# Patient Record
Sex: Female | Born: 1945
Health system: Southern US, Community
[De-identification: ages and names within clinical notes are randomized; demographics above are authoritative.]

## PROBLEM LIST (undated history)

## (undated) DIAGNOSIS — R51 Headache: Secondary | ICD-10-CM

## (undated) DIAGNOSIS — R350 Frequency of micturition: Secondary | ICD-10-CM

## (undated) DIAGNOSIS — I639 Cerebral infarction, unspecified: Secondary | ICD-10-CM

## (undated) DIAGNOSIS — K5900 Constipation, unspecified: Secondary | ICD-10-CM

## (undated) DIAGNOSIS — M549 Dorsalgia, unspecified: Secondary | ICD-10-CM

## (undated) DIAGNOSIS — G4730 Sleep apnea, unspecified: Secondary | ICD-10-CM

## (undated) DIAGNOSIS — T884XXA Failed or difficult intubation, initial encounter: Secondary | ICD-10-CM

## (undated) DIAGNOSIS — T8189XA Other complications of procedures, not elsewhere classified, initial encounter: Secondary | ICD-10-CM

## (undated) DIAGNOSIS — T4145XA Adverse effect of unspecified anesthetic, initial encounter: Secondary | ICD-10-CM

## (undated) DIAGNOSIS — E785 Hyperlipidemia, unspecified: Secondary | ICD-10-CM

## (undated) DIAGNOSIS — I499 Cardiac arrhythmia, unspecified: Secondary | ICD-10-CM

## (undated) DIAGNOSIS — I2510 Atherosclerotic heart disease of native coronary artery without angina pectoris: Secondary | ICD-10-CM

## (undated) DIAGNOSIS — R519 Headache, unspecified: Secondary | ICD-10-CM

## (undated) DIAGNOSIS — I10 Essential (primary) hypertension: Secondary | ICD-10-CM

## (undated) DIAGNOSIS — D649 Anemia, unspecified: Secondary | ICD-10-CM

## (undated) DIAGNOSIS — C801 Malignant (primary) neoplasm, unspecified: Secondary | ICD-10-CM

## (undated) DIAGNOSIS — G459 Transient cerebral ischemic attack, unspecified: Secondary | ICD-10-CM

## (undated) DIAGNOSIS — Z9289 Personal history of other medical treatment: Secondary | ICD-10-CM

## (undated) DIAGNOSIS — Z8619 Personal history of other infectious and parasitic diseases: Secondary | ICD-10-CM

## (undated) DIAGNOSIS — I4891 Unspecified atrial fibrillation: Secondary | ICD-10-CM

## (undated) DIAGNOSIS — I509 Heart failure, unspecified: Secondary | ICD-10-CM

## (undated) DIAGNOSIS — T7840XA Allergy, unspecified, initial encounter: Secondary | ICD-10-CM

## (undated) DIAGNOSIS — K219 Gastro-esophageal reflux disease without esophagitis: Secondary | ICD-10-CM

## (undated) DIAGNOSIS — G8929 Other chronic pain: Secondary | ICD-10-CM

## (undated) DIAGNOSIS — T8859XA Other complications of anesthesia, initial encounter: Secondary | ICD-10-CM

## (undated) HISTORY — DX: Heart failure, unspecified: I50.9

## (undated) HISTORY — PX: COLONOSCOPY: SHX174

## (undated) HISTORY — PX: ABDOMINAL HYSTERECTOMY: SHX81

## (undated) HISTORY — PX: HAMMER TOE SURGERY: SHX385

## (undated) HISTORY — DX: Allergy, unspecified, initial encounter: T78.40XA

## (undated) HISTORY — PX: ESOPHAGOGASTRODUODENOSCOPY: SHX1529

## (undated) HISTORY — DX: Gastro-esophageal reflux disease without esophagitis: K21.9

## (undated) HISTORY — PX: TONSILLECTOMY: SUR1361

---
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
## 1982-01-04 HISTORY — PX: BREAST SURGERY: SHX581

## 2006-03-17 ENCOUNTER — Ambulatory Visit: Payer: Self-pay | Admitting: Physician Assistant

## 2007-03-21 ENCOUNTER — Ambulatory Visit: Payer: Self-pay | Admitting: Gastroenterology

## 2007-03-21 LAB — HM COLONOSCOPY

## 2009-05-28 ENCOUNTER — Observation Stay: Payer: Self-pay | Admitting: Internal Medicine

## 2012-01-05 HISTORY — PX: CHOLECYSTECTOMY: SHX55

## 2012-04-25 ENCOUNTER — Ambulatory Visit: Payer: Self-pay | Admitting: Family Medicine

## 2012-04-27 ENCOUNTER — Ambulatory Visit (INDEPENDENT_AMBULATORY_CARE_PROVIDER_SITE_OTHER): Payer: Medicare Other | Admitting: General Surgery

## 2012-04-27 ENCOUNTER — Encounter: Payer: Self-pay | Admitting: General Surgery

## 2012-04-27 VITALS — BP 144/70 | HR 72 | Resp 14 | Ht 65.0 in | Wt 176.0 lb

## 2012-04-27 DIAGNOSIS — K8010 Calculus of gallbladder with chronic cholecystitis without obstruction: Secondary | ICD-10-CM

## 2012-04-27 LAB — CBC WITH DIFFERENTIAL/PLATELET
Basophil #: 0.1 10*3/uL (ref 0.0–0.1)
Basophil %: 0.4 %
Eosinophil #: 0.1 10*3/uL (ref 0.0–0.7)
Eosinophil %: 0.4 %
HCT: 53.4 % — ABNORMAL HIGH (ref 35.0–47.0)
HGB: 17.7 g/dL — ABNORMAL HIGH (ref 12.0–16.0)
Lymphocyte #: 2 10*3/uL (ref 1.0–3.6)
Lymphocyte %: 14.3 %
MCH: 30.3 pg (ref 26.0–34.0)
MCHC: 33.2 g/dL (ref 32.0–36.0)
MCV: 91 fL (ref 80–100)
Monocyte #: 0.8 x10 3/mm (ref 0.2–0.9)
Monocyte %: 5.6 %
Neutrophil #: 10.8 10*3/uL — ABNORMAL HIGH (ref 1.4–6.5)
Neutrophil %: 79.3 %
Platelet: 224 10*3/uL (ref 150–440)
RBC: 5.85 10*6/uL — ABNORMAL HIGH (ref 3.80–5.20)
RDW: 13.1 % (ref 11.5–14.5)
WBC: 13.7 10*3/uL — ABNORMAL HIGH (ref 3.6–11.0)

## 2012-04-27 LAB — COMPREHENSIVE METABOLIC PANEL
Albumin: 3.3 g/dL — ABNORMAL LOW (ref 3.4–5.0)
Alkaline Phosphatase: 97 U/L (ref 50–136)
Anion Gap: 7 (ref 7–16)
BUN: 21 mg/dL — ABNORMAL HIGH (ref 7–18)
Bilirubin,Total: 0.6 mg/dL (ref 0.2–1.0)
Calcium, Total: 8.7 mg/dL (ref 8.5–10.1)
Chloride: 110 mmol/L — ABNORMAL HIGH (ref 98–107)
Co2: 23 mmol/L (ref 21–32)
Creatinine: 0.97 mg/dL (ref 0.60–1.30)
EGFR (African American): >60
EGFR (Non-African Amer.): >60
Glucose: 113 mg/dL — ABNORMAL HIGH (ref 65–99)
Osmolality: 283 (ref 275–301)
Potassium: 4.2 mmol/L (ref 3.5–5.1)
SGOT(AST): 25 U/L (ref 15–37)
SGPT (ALT): 24 U/L (ref 12–78)
Sodium: 140 mmol/L (ref 136–145)
Total Protein: 7 g/dL (ref 6.4–8.2)

## 2012-04-27 LAB — LIPASE, BLOOD: Lipase: 185 U/L (ref 73–393)

## 2012-04-27 NOTE — Progress Notes (Signed)
Patient ID: Kayla Maxwell, female   DOB: 1945/03/28, 67 y.o.   MRN: [id number]  No chief complaint on file.   HPI Kayla Maxwell is a 67 y.o. female here today gallbladder . Patient had an ultrasound done 04/25/12.She has been having abdominal pain about an month. She stated having very bad pain started on  04/18/12 than stopped. On  04/22/12 she states that the pain came back and has been hurting ever since. HPI  Past Medical History  Diagnosis Date  . Allergy   . Acid reflux     Past Surgical History  Procedure Laterality Date  . Cesarean section  1968  . Breast surgery  1984  . Appendectomy  1963    No family history on file.  Social History History  Substance Use Topics  . Smoking status: Current Every Day Smoker -- 1.00 packs/day for 40 years  . Smokeless tobacco: Never Used  . Alcohol Use: No    Allergies  Allergen Reactions  . Codeine   . Penicillins   . Sulfa Antibiotics     Current Outpatient Prescriptions  Medication Sig Dispense Refill  . montelukast (SINGULAIR) 10 MG tablet Take 1 tablet by mouth daily.      Marland Kitchen NEXIUM 40 MG capsule Take 1 capsule by mouth daily.       No current facility-administered medications for this visit.    Review of Systems Review of Systems  Constitutional: Positive for fever, chills, fatigue and unexpected weight change. Negative for diaphoresis, activity change and appetite change.  Respiratory: Negative for apnea, cough, choking, chest tightness, wheezing and stridor.   Cardiovascular: Negative for chest pain, palpitations and leg swelling.  Gastrointestinal: Positive for nausea, vomiting, abdominal pain, constipation and blood in stool. Negative for diarrhea, abdominal distention, anal bleeding and rectal pain.    Blood pressure 144/70, pulse 72, resp. rate 14, height 5\' 5"  (1.651 m), weight 176 lb (79.833 kg).  Physical Exam Physical Exam  Constitutional: She is oriented to person, place, and time. She appears  well-developed and well-nourished.  Eyes: Conjunctivae are normal. No scleral icterus.  Neck: Normal range of motion. Neck supple.  Cardiovascular: Normal rate, regular rhythm and normal heart sounds.   Pulmonary/Chest: Effort normal and breath sounds normal.  Abdominal: Bowel sounds are normal. There is no hepatosplenomegaly. There is tenderness in the right upper quadrant. There is no rebound. No hernia.    Neurological: She is alert and oriented to person, place, and time.  Skin: Skin is warm and intact.    Data Reviewed [place] reviewed-gallstones and GBW thickening, no pericholcystic fluid. Labs- wbc 11 k, LFTs and Lipase normal.  Assessment    Suspect acute cholcystitis with cholelithiasis. Pt is very uncomfortable.     Plan    Admit to observationin [facility], lap chole tomorrow. Repeat labs.     Patient to be admitted to [facility] with plans for surgery on 04-28-12.   SANKAR,SEEPLAPUTHUR G 04/27/2012, 5:27 PM

## 2012-04-27 NOTE — Patient Instructions (Addendum)
Laparoscopic Cholecystectomy with Intraoperative Cholangiogram. The procedure, including it's potential risks and complications (including but not limited to infection, bleeding, injury to intra-abdominal organs or bile ducts, bile leak, poor cosmetic result, sepsis and death) were discussed with the patient in detail. Non-operative options, including their inherent risks (acute calculous cholecystitis with possible choledocholithiasis or gallstone pancreatitis, with the risk of ascending cholangitis, sepsis, and death) were discussed as well. The patient expressed and understanding of what we discussed and wishes to proceed with laparoscopic cholecystectomy. The patient further understands that if it is technically not possible, or it is unsafe to proceed laparoscopically, that I will convert to an open cholecystectomy.  Patient to be admitted to [facility] with plans for surgery on 04-28-12.

## 2012-04-28 ENCOUNTER — Inpatient Hospital Stay: Payer: Self-pay | Admitting: General Surgery

## 2012-04-28 DIAGNOSIS — K8000 Calculus of gallbladder with acute cholecystitis without obstruction: Secondary | ICD-10-CM

## 2012-04-29 LAB — CBC WITH DIFFERENTIAL/PLATELET
Basophil #: 0.1 10*3/uL (ref 0.0–0.1)
Basophil %: 0.3 %
Eosinophil #: 0 10*3/uL (ref 0.0–0.7)
Eosinophil %: 0.1 %
HCT: 42.3 % (ref 35.0–47.0)
HGB: 14.3 g/dL (ref 12.0–16.0)
Lymphocyte #: 1.6 10*3/uL (ref 1.0–3.6)
Lymphocyte %: 10.4 %
MCH: 30.4 pg (ref 26.0–34.0)
MCHC: 33.8 g/dL (ref 32.0–36.0)
MCV: 90 fL (ref 80–100)
Monocyte #: 1 x10 3/mm — ABNORMAL HIGH (ref 0.2–0.9)
Monocyte %: 6.4 %
Neutrophil #: 12.8 10*3/uL — ABNORMAL HIGH (ref 1.4–6.5)
Neutrophil %: 82.8 %
Platelet: 212 10*3/uL (ref 150–440)
RBC: 4.71 10*6/uL (ref 3.80–5.20)
RDW: 12.9 % (ref 11.5–14.5)
WBC: 15.5 10*3/uL — ABNORMAL HIGH (ref 3.6–11.0)

## 2012-04-29 LAB — COMPREHENSIVE METABOLIC PANEL
Albumin: 2.7 g/dL — ABNORMAL LOW (ref 3.4–5.0)
Alkaline Phosphatase: 91 U/L (ref 50–136)
Anion Gap: 6 — ABNORMAL LOW (ref 7–16)
BUN: 11 mg/dL (ref 7–18)
Bilirubin,Total: 0.4 mg/dL (ref 0.2–1.0)
Calcium, Total: 8.5 mg/dL (ref 8.5–10.1)
Chloride: 103 mmol/L (ref 98–107)
Co2: 26 mmol/L (ref 21–32)
Creatinine: 1.1 mg/dL (ref 0.60–1.30)
Glucose: 125 mg/dL — ABNORMAL HIGH (ref 65–99)
Osmolality: 271 (ref 275–301)
Potassium: 4.7 mmol/L (ref 3.5–5.1)
SGOT(AST): 44 U/L — ABNORMAL HIGH (ref 15–37)
SGPT (ALT): 64 U/L (ref 12–78)
Sodium: 135 mmol/L — ABNORMAL LOW (ref 136–145)
Total Protein: 5.8 g/dL — ABNORMAL LOW (ref 6.4–8.2)

## 2012-05-01 ENCOUNTER — Ambulatory Visit (INDEPENDENT_AMBULATORY_CARE_PROVIDER_SITE_OTHER): Payer: Medicare Other | Admitting: General Surgery

## 2012-05-01 ENCOUNTER — Encounter: Payer: Self-pay | Admitting: General Surgery

## 2012-05-01 DIAGNOSIS — K8010 Calculus of gallbladder with chronic cholecystitis without obstruction: Secondary | ICD-10-CM

## 2012-05-01 NOTE — Patient Instructions (Addendum)
Patient to change dressing as needed. Patient advised to return as scheduled.

## 2012-05-01 NOTE — Progress Notes (Signed)
Patient ID: Kayla Maxwell, female   DOB: 1945/10/24, 67 y.o.   MRN: [id number] Removed  JP drain and applied two 2x2 and paper tape placed over site with minimal discomfort to patient . Nurse advise her not to drive until pain free and feels comfortable. Redness notice around umbilical site.

## 2012-05-02 ENCOUNTER — Encounter: Payer: Self-pay | Admitting: General Surgery

## 2012-05-02 LAB — PATHOLOGY REPORT

## 2012-05-03 ENCOUNTER — Encounter: Payer: Self-pay | Admitting: General Surgery

## 2012-05-17 ENCOUNTER — Ambulatory Visit (INDEPENDENT_AMBULATORY_CARE_PROVIDER_SITE_OTHER): Payer: Medicare Other | Admitting: General Surgery

## 2012-05-17 ENCOUNTER — Encounter: Payer: Self-pay | Admitting: General Surgery

## 2012-05-17 VITALS — BP 144/78 | HR 76 | Resp 14 | Ht 65.0 in | Wt 176.0 lb

## 2012-05-17 DIAGNOSIS — K8010 Calculus of gallbladder with chronic cholecystitis without obstruction: Secondary | ICD-10-CM

## 2012-05-17 NOTE — Progress Notes (Signed)
Patient ID: Kayla Maxwell, female   DOB: November 07, 1945, 67 y.o.   MRN: [id number] This is an 67 year old female following up from her gallbladder surgery done on 04/28/12. Patient states no new problems. Eyes are clean and no icterus. Por sites are healing well. Abdomen is soft. Path reports was consistent with  Acute cholecystitis . Doing well postop

## 2012-05-17 NOTE — Patient Instructions (Addendum)
Resume activities as tolerated

## 2012-05-18 ENCOUNTER — Encounter: Payer: Self-pay | Admitting: General Surgery

## 2013-06-04 DIAGNOSIS — M25559 Pain in unspecified hip: Secondary | ICD-10-CM | POA: Insufficient documentation

## 2013-06-18 DIAGNOSIS — M5410 Radiculopathy, site unspecified: Secondary | ICD-10-CM | POA: Insufficient documentation

## 2013-06-18 DIAGNOSIS — M7060 Trochanteric bursitis, unspecified hip: Secondary | ICD-10-CM | POA: Insufficient documentation

## 2013-08-01 ENCOUNTER — Ambulatory Visit: Payer: Self-pay | Admitting: Orthopedic Surgery

## 2013-08-24 DIAGNOSIS — M5136 Other intervertebral disc degeneration, lumbar region: Secondary | ICD-10-CM | POA: Insufficient documentation

## 2013-09-21 DIAGNOSIS — M5116 Intervertebral disc disorders with radiculopathy, lumbar region: Secondary | ICD-10-CM | POA: Insufficient documentation

## 2013-11-05 ENCOUNTER — Encounter: Payer: Self-pay | Admitting: General Surgery

## 2013-11-09 ENCOUNTER — Observation Stay: Payer: Self-pay | Admitting: Internal Medicine

## 2013-11-09 LAB — BASIC METABOLIC PANEL
Anion Gap: 7 (ref 7–16)
BUN: 19 mg/dL — ABNORMAL HIGH (ref 7–18)
Calcium, Total: 8.3 mg/dL — ABNORMAL LOW (ref 8.5–10.1)
Chloride: 109 mmol/L — ABNORMAL HIGH (ref 98–107)
Co2: 26 mmol/L (ref 21–32)
Creatinine: 0.95 mg/dL (ref 0.60–1.30)
EGFR (African American): >60
EGFR (Non-African Amer.): >60
Glucose: 98 mg/dL (ref 65–99)
Osmolality: 285 (ref 275–301)
Potassium: 4.8 mmol/L (ref 3.5–5.1)
Sodium: 142 mmol/L (ref 136–145)

## 2013-11-09 LAB — CBC WITH DIFFERENTIAL/PLATELET
Basophil #: 0 10*3/uL (ref 0.0–0.1)
Basophil %: 0.3 %
Eosinophil #: 0.1 10*3/uL (ref 0.0–0.7)
Eosinophil %: 0.6 %
HCT: 50 % — ABNORMAL HIGH (ref 35.0–47.0)
HGB: 16.6 g/dL — ABNORMAL HIGH (ref 12.0–16.0)
Lymphocyte #: 1.6 10*3/uL (ref 1.0–3.6)
Lymphocyte %: 15.4 %
MCH: 31.7 pg (ref 26.0–34.0)
MCHC: 33.3 g/dL (ref 32.0–36.0)
MCV: 95 fL (ref 80–100)
Monocyte #: 0.5 x10 3/mm (ref 0.2–0.9)
Monocyte %: 5 %
Neutrophil #: 8.2 10*3/uL — ABNORMAL HIGH (ref 1.4–6.5)
Neutrophil %: 78.7 %
Platelet: 199 10*3/uL (ref 150–440)
RBC: 5.25 10*6/uL — ABNORMAL HIGH (ref 3.80–5.20)
RDW: 12.7 % (ref 11.5–14.5)
WBC: 10.5 10*3/uL (ref 3.6–11.0)

## 2013-11-09 LAB — TROPONIN I: Troponin-I: <0.02 ng/mL

## 2013-11-09 LAB — URINALYSIS, COMPLETE
Bacteria: NONE SEEN
Bilirubin,UR: NEGATIVE
Glucose,UR: NEGATIVE mg/dL (ref 0–75)
Ketone: NEGATIVE
Leukocyte Esterase: NEGATIVE
Nitrite: NEGATIVE
Ph: 5 (ref 4.5–8.0)
Protein: NEGATIVE
RBC,UR: 1 /HPF (ref 0–5)
Specific Gravity: 1.012 (ref 1.003–1.030)
Squamous Epithelial: <1
WBC UR: <1 /HPF (ref 0–5)

## 2013-11-10 LAB — TSH: Thyroid Stimulating Horm: 0.446 u[IU]/mL — ABNORMAL LOW

## 2013-11-10 LAB — LIPID PANEL
Cholesterol: 201 mg/dL — ABNORMAL HIGH (ref 0–200)
HDL Cholesterol: 45 mg/dL (ref 40–60)
Ldl Cholesterol, Calc: 117 mg/dL — ABNORMAL HIGH (ref 0–100)
Triglycerides: 195 mg/dL (ref 0–200)
VLDL Cholesterol, Calc: 39 mg/dL (ref 5–40)

## 2013-11-10 LAB — CBC WITH DIFFERENTIAL/PLATELET
Basophil #: 0 10*3/uL (ref 0.0–0.1)
Basophil %: 0.3 %
Eosinophil #: 0 10*3/uL (ref 0.0–0.7)
Eosinophil %: 0 %
HCT: 46.6 % (ref 35.0–47.0)
HGB: 15.8 g/dL (ref 12.0–16.0)
Lymphocyte #: 0.8 10*3/uL — ABNORMAL LOW (ref 1.0–3.6)
Lymphocyte %: 15.7 %
MCH: 31.6 pg (ref 26.0–34.0)
MCHC: 33.9 g/dL (ref 32.0–36.0)
MCV: 93 fL (ref 80–100)
Monocyte #: 0.1 x10 3/mm — ABNORMAL LOW (ref 0.2–0.9)
Monocyte %: 1.1 %
Neutrophil #: 4.5 10*3/uL (ref 1.4–6.5)
Neutrophil %: 82.9 %
Platelet: 192 10*3/uL (ref 150–440)
RBC: 5 10*6/uL (ref 3.80–5.20)
RDW: 12.9 % (ref 11.5–14.5)
WBC: 5.4 10*3/uL (ref 3.6–11.0)

## 2013-11-10 LAB — BASIC METABOLIC PANEL
Anion Gap: 5 — ABNORMAL LOW (ref 7–16)
BUN: 14 mg/dL (ref 7–18)
Calcium, Total: 7.9 mg/dL — ABNORMAL LOW (ref 8.5–10.1)
Chloride: 112 mmol/L — ABNORMAL HIGH (ref 98–107)
Co2: 24 mmol/L (ref 21–32)
Creatinine: 0.73 mg/dL (ref 0.60–1.30)
EGFR (African American): >60
EGFR (Non-African Amer.): >60
Glucose: 146 mg/dL — ABNORMAL HIGH (ref 65–99)
Osmolality: 284 (ref 275–301)
Potassium: 4.2 mmol/L (ref 3.5–5.1)
Sodium: 141 mmol/L (ref 136–145)

## 2014-02-22 ENCOUNTER — Other Ambulatory Visit: Payer: Self-pay | Admitting: Neurosurgery

## 2014-02-22 DIAGNOSIS — M5416 Radiculopathy, lumbar region: Secondary | ICD-10-CM

## 2014-03-01 ENCOUNTER — Ambulatory Visit
Admission: RE | Admit: 2014-03-01 | Discharge: 2014-03-01 | Disposition: A | Discharge status: Home / Self Care | Payer: Medicare Other | Source: Ambulatory Visit | Attending: Neurosurgery | Admitting: Neurosurgery

## 2014-03-01 ENCOUNTER — Other Ambulatory Visit: Payer: Self-pay | Admitting: Neurosurgery

## 2014-03-01 ENCOUNTER — Inpatient Hospital Stay
Admission: RE | Admit: 2014-03-01 | Discharge: 2014-03-01 | Disposition: A | Discharge status: Home / Self Care | Payer: Self-pay | Source: Ambulatory Visit | Attending: Neurosurgery | Admitting: Neurosurgery

## 2014-03-01 DIAGNOSIS — M5416 Radiculopathy, lumbar region: Secondary | ICD-10-CM

## 2014-03-01 DIAGNOSIS — R52 Pain, unspecified: Secondary | ICD-10-CM

## 2014-03-01 MED ORDER — IOHEXOL 180 MG/ML  SOLN
15.0000 mL | Freq: Once | INTRAMUSCULAR | Status: AC | PRN
  Administered 2014-03-01: 15 mL via INTRATHECAL

## 2014-03-01 MED ORDER — DIAZEPAM 5 MG PO TABS
5.0000 mg | ORAL_TABLET | Freq: Once | ORAL | Status: AC
  Administered 2014-03-01: 5 mg via ORAL

## 2014-03-01 NOTE — Discharge Instructions (Signed)
Myelogram Discharge Instructions ° °1. Go home and rest quietly for the next 24 hours.  It is important to lie flat for the next 24 hours.  Get up only to go to the restroom.  You may lie in the bed or on a couch on your back, your stomach, your left side or your right side.  You may have one pillow under your head.  You may have pillows between your knees while you are on your side or under your knees while you are on your back. ° °2. DO NOT drive today.  Recline the seat as far back as it will go, while [place] wearing your seat belt, on the way home. ° °3. You may get up to go to the bathroom as needed.  You may sit up for 10 minutes to eat.  You may resume your normal diet and medications unless otherwise indicated.  Drink plenty of extra fluids today and tomorrow. ° °4. The incidence of a spinal headache with nausea and/or vomiting is about 5% (one in 20 patients).  If you develop a headache, lie flat and drink plenty of fluids until the headache goes away.  Caffeinated beverages may be helpful.  If you develop severe nausea and vomiting or a headache that does not go away with flat bed rest, call [phone]. ° °5. You may resume normal activities after your 24 hours of bed rest is over; however, do not exert yourself strongly or do any heavy lifting tomorrow. ° °6. Call your physician for a follow-up appointment.  °

## 2014-04-12 ENCOUNTER — Other Ambulatory Visit: Payer: Self-pay | Admitting: Neurosurgery

## 2014-04-19 ENCOUNTER — Encounter (HOSPITAL_COMMUNITY): Payer: Self-pay

## 2014-04-19 ENCOUNTER — Encounter (HOSPITAL_COMMUNITY)
Admission: RE | Admit: 2014-04-19 | Discharge: 2014-04-19 | Disposition: A | Discharge status: Home / Self Care | Payer: Medicare Other | Source: Ambulatory Visit | Attending: Neurosurgery | Admitting: Neurosurgery

## 2014-04-19 DIAGNOSIS — Z01812 Encounter for preprocedural laboratory examination: Secondary | ICD-10-CM | POA: Diagnosis not present

## 2014-04-19 HISTORY — DX: Personal history of other medical treatment: Z92.89

## 2014-04-19 HISTORY — DX: Headache, unspecified: R51.9

## 2014-04-19 HISTORY — DX: Frequency of micturition: R35.0

## 2014-04-19 HISTORY — DX: Personal history of other infectious and parasitic diseases: Z86.19

## 2014-04-19 HISTORY — DX: Other chronic pain: G89.29

## 2014-04-19 HISTORY — DX: Hyperlipidemia, unspecified: E78.5

## 2014-04-19 HISTORY — DX: Headache: R51

## 2014-04-19 HISTORY — DX: Dorsalgia, unspecified: M54.9

## 2014-04-19 HISTORY — DX: Transient cerebral ischemic attack, unspecified: G45.9

## 2014-04-19 HISTORY — DX: Constipation, unspecified: K59.00

## 2014-04-19 LAB — BASIC METABOLIC PANEL
Anion gap: 8 (ref 5–15)
BUN: 24 mg/dL — ABNORMAL HIGH (ref 6–23)
CO2: 25 mmol/L (ref 19–32)
Calcium: 9 mg/dL (ref 8.4–10.5)
Chloride: 108 mmol/L (ref 96–112)
Creatinine, Ser: 1.08 mg/dL (ref 0.50–1.10)
GFR calc Af Amer: 59 mL/min — ABNORMAL LOW (ref >90)
GFR calc non Af Amer: 51 mL/min — ABNORMAL LOW (ref >90)
Glucose, Bld: 111 mg/dL — ABNORMAL HIGH (ref 70–99)
Potassium: 4.9 mmol/L (ref 3.5–5.1)
Sodium: 141 mmol/L (ref 135–145)

## 2014-04-19 LAB — CBC
HCT: 51.4 % — ABNORMAL HIGH (ref 36.0–46.0)
Hemoglobin: 17.2 g/dL — ABNORMAL HIGH (ref 12.0–15.0)
MCH: 30.8 pg (ref 26.0–34.0)
MCHC: 33.5 g/dL (ref 30.0–36.0)
MCV: 92.1 fL (ref 78.0–100.0)
Platelets: 229 10*3/uL (ref 150–400)
RBC: 5.58 MIL/uL — ABNORMAL HIGH (ref 3.87–5.11)
RDW: 12.8 % (ref 11.5–15.5)
WBC: 9.4 10*3/uL (ref 4.0–10.5)

## 2014-04-19 LAB — SURGICAL PCR SCREEN
MRSA, PCR: NEGATIVE
Staphylococcus aureus: NEGATIVE

## 2014-04-19 NOTE — Pre-Procedure Instructions (Signed)
Brynlie Daza Eagon  04/19/2014   Your procedure is scheduled on:  Tues, April 19 @ 12:05 PM  Report to [facility]  at 10:00 AM.  Call this number if you have problems the morning of surgery: [phone]   Remember:   Do not eat food or drink liquids after midnight.   Take these medicines the morning of surgery with A SIP OF WATER: Singulair(Montelukast) and Nexium(Esomeprazole)               Stop taking your Ibuprofen. No Goody's,BC's,Aleve,Aspirin,Fish Oil,or any Herbal Medications.    Do not wear jewelry, make-up or nail polish.  Do not wear lotions, powders, or perfumes. You may wear deodorant.  Do not shave 48 hours prior to surgery.   Do not bring valuables to the hospital.  [facility] is not responsible                  for any belongings or valuables.               Contacts, dentures or bridgework may not be worn into surgery.  Leave suitcase in the car. After surgery it may be brought to your room.  For patients admitted to the hospital, discharge time is determined by your                treatment team.               Patients discharged the day of surgery will not be allowed to drive  home.    Special Instructions:  [facility] - Preparing for Surgery  Before surgery, you can play an important role.  Because skin is not sterile, your skin needs to be as free of germs as possible.  You can reduce the number of germs on you skin by washing with CHG (chlorahexidine gluconate) soap before surgery.  CHG is an antiseptic cleaner which kills germs and bonds with the skin to continue killing germs even after washing.  Please DO NOT use if you have an allergy to CHG or antibacterial soaps.  If your skin becomes reddened/irritated stop using the CHG and inform your nurse when you arrive at Short Stay.  Do not shave (including legs and underarms) for at least 48 hours prior to the first CHG shower.  You may shave your face.  Please follow these instructions carefully:   1.   Shower with CHG Soap the night before surgery and the                                morning of Surgery.  2.  If you choose to wash your hair, wash your hair first as usual with your       normal shampoo.  3.  After you shampoo, rinse your hair and body thoroughly to remove the                      Shampoo.  4.  Use CHG as you would any other liquid soap.  You can apply chg directly       to the skin and wash gently with scrungie or a clean washcloth.  5.  Apply the CHG Soap to your body ONLY FROM THE NECK DOWN.        Do not use on open wounds or open sores.  Avoid contact with your eyes,  ears, mouth and genitals (private parts).  Wash genitals (private parts)       with your normal soap.  6.  Wash thoroughly, paying special attention to the area where your surgery        will be performed.  7.  Thoroughly rinse your body with warm water from the neck down.  8.  DO NOT shower/wash with your normal soap after using and rinsing off       the CHG Soap.  9.  Pat yourself dry with a clean towel.            10.  Wear clean pajamas.            11.  Place clean sheets on your bed the night of your first shower and do not        sleep with pets.  Day of Surgery  Do not apply any lotions/deoderants the morning of surgery.  Please wear clean clothes to the hospital/surgery center.     Please read over the following fact sheets that you were given: Pain Booklet, Coughing and Deep Breathing, MRSA Information and Surgical Site Infection Prevention

## 2014-04-19 NOTE — Progress Notes (Addendum)
Pt doesn't have a Cardiologist  Denies ever having anEcho  Stress test done several yrs ago   Denies ever having Heart cath  Denies CXR in past yr  EKG to be requested from [facility] is Dr.Nancy Venia Minks

## 2014-04-22 ENCOUNTER — Inpatient Hospital Stay (HOSPITAL_COMMUNITY): Payer: Medicare Other | Admitting: Vascular Surgery

## 2014-04-22 ENCOUNTER — Inpatient Hospital Stay (HOSPITAL_COMMUNITY): Payer: Medicare Other | Admitting: Anesthesiology

## 2014-04-22 NOTE — Progress Notes (Signed)
Anesthesia Chart Review:  Patient is a 69 year old female scheduled for bilateral L4-5 laminectomy on 04/25/14 by Dr. Hal Neer.  History includes smoker, HLD, GERD, chronic back pain, reported evidence of "TIA" [CVA] on prior MRI. PCP is Dr. Margarita Rana.  She denied seeing a cardiologist.  Preoperative labs noted. H/H 17.2/51.4.  She thought she had a prior stress test at [facility], but only an echo and EKG done there.   EKG Nov 18, 2013 [facility]) showed SR with short PR, LVH with repolarization abnormality (has T wave inversion in anterolateral leads, non-specific in inferior leads). Comparison EKG on 04/28/12 showed incomplete right BBB, LVH with repolarization abnormality (inferior and lateral T wave abnormality is more pronounced when compared to 11-18-2013).    05/29/09 Echo [facility]): Normal LV systolic function. EF => 55%. Mild concentric LVH. Mild MR/TR. Mildly dilated RA.  Reviewed her prior EKGs and 2011 echo with anesthesiologist Dr. Jenita Seashore.  Has had T wave abnormality felt to be related to repolarization abnormality for at least two years. She is a smoker, but she has no known CAD/MI/CHF or DM history. No CV symptoms documented at her PAT visit. If no acute changes then it is anticipated that she can proceed as planned.  George Hugh [facility] Short Stay Center/Anesthesiology Phone [phone] 04/22/2014 4:53 PM

## 2014-04-23 NOTE — Progress Notes (Signed)
Left message on pt's home number of time of arrival for Thursday, 04/25/14, instructed pt to be here at 10:00 AM.

## 2014-04-24 MED ORDER — VANCOMYCIN HCL IN DEXTROSE 1-5 GM/200ML-% IV SOLN
1000.0000 mg | INTRAVENOUS | Status: AC
  Filled 2014-04-24: qty 200

## 2014-04-24 MED ORDER — DEXAMETHASONE SODIUM PHOSPHATE 10 MG/ML IJ SOLN
10.0000 mg | INTRAMUSCULAR | Status: AC
  Filled 2014-04-24: qty 1

## 2014-04-25 ENCOUNTER — Ambulatory Visit (HOSPITAL_COMMUNITY)
Admission: RE | Admit: 2014-04-25 | Discharge: 2014-04-25 | DRG: 552 | Disposition: A | Discharge status: Home / Self Care | Payer: Medicare Other | Source: Ambulatory Visit | Attending: Neurosurgery | Admitting: Neurosurgery

## 2014-04-25 ENCOUNTER — Encounter (HOSPITAL_COMMUNITY): Payer: Self-pay | Admitting: *Deleted

## 2014-04-25 ENCOUNTER — Encounter (HOSPITAL_COMMUNITY)
Admission: RE | Disposition: A | Discharge status: Home / Self Care | Payer: Self-pay | Source: Ambulatory Visit | Attending: Neurosurgery

## 2014-04-25 DIAGNOSIS — M1990 Unspecified osteoarthritis, unspecified site: Secondary | ICD-10-CM | POA: Diagnosis present

## 2014-04-25 DIAGNOSIS — I4891 Unspecified atrial fibrillation: Secondary | ICD-10-CM | POA: Diagnosis not present

## 2014-04-25 DIAGNOSIS — M4806 Spinal stenosis, lumbar region: Secondary | ICD-10-CM | POA: Diagnosis present

## 2014-04-25 DIAGNOSIS — F17210 Nicotine dependence, cigarettes, uncomplicated: Secondary | ICD-10-CM | POA: Diagnosis not present

## 2014-04-25 DIAGNOSIS — I10 Essential (primary) hypertension: Secondary | ICD-10-CM | POA: Diagnosis present

## 2014-04-25 DIAGNOSIS — R0600 Dyspnea, unspecified: Secondary | ICD-10-CM | POA: Diagnosis present

## 2014-04-25 DIAGNOSIS — K219 Gastro-esophageal reflux disease without esophagitis: Secondary | ICD-10-CM | POA: Diagnosis not present

## 2014-04-25 DIAGNOSIS — Z538 Procedure and treatment not carried out for other reasons: Secondary | ICD-10-CM | POA: Diagnosis present

## 2014-04-25 SURGERY — CANCELLED PROCEDURE
Anesthesia: General | Site: Back | Laterality: Bilateral

## 2014-04-25 MED ORDER — NEOSTIGMINE METHYLSULFATE 10 MG/10ML IV SOLN
INTRAVENOUS | Status: AC
  Filled 2014-04-25: qty 3

## 2014-04-25 MED ORDER — FENTANYL CITRATE (PF) 250 MCG/5ML IJ SOLN
INTRAMUSCULAR | Status: AC
  Filled 2014-04-25: qty 5

## 2014-04-25 MED ORDER — MIDAZOLAM HCL 2 MG/2ML IJ SOLN
INTRAMUSCULAR | Status: AC
  Filled 2014-04-25: qty 2

## 2014-04-25 MED ORDER — PROPOFOL 10 MG/ML IV BOLUS
INTRAVENOUS | Status: AC
  Filled 2014-04-25: qty 20

## 2014-04-25 MED ORDER — LACTATED RINGERS IV SOLN
INTRAVENOUS | Status: DC
  Administered 2014-04-25: 10:00:00 via INTRAVENOUS

## 2014-04-25 SURGICAL SUPPLY — 41 items
BAG DECANTER FOR FLEXI CONT (MISCELLANEOUS) ×2 IMPLANT
BENZOIN TINCTURE PRP APPL 2/3 (GAUZE/BANDAGES/DRESSINGS) ×4 IMPLANT
BLADE CLIPPER SURG (BLADE) ×2 IMPLANT
BRUSH SCRUB EZ PLAIN DRY (MISCELLANEOUS) ×2 IMPLANT
BUR CUTTER 7.0 ROUND (BURR) ×2 IMPLANT
BUR MATCHSTICK NEURO 3.0 LAGG (BURR) IMPLANT
CANISTER SUCT 3000ML PPV (MISCELLANEOUS) ×2 IMPLANT
CONT SPEC 4OZ CLIKSEAL STRL BL (MISCELLANEOUS) ×2 IMPLANT
DRAPE C-ARM 42X72 X-RAY (DRAPES) ×4 IMPLANT
DRAPE LAPAROTOMY 100X72X124 (DRAPES) ×2 IMPLANT
DRAPE SURG 17X23 STRL (DRAPES) ×4 IMPLANT
DRSG OPSITE POSTOP 4X6 (GAUZE/BANDAGES/DRESSINGS) ×2 IMPLANT
DURAPREP 26ML APPLICATOR (WOUND CARE) ×2 IMPLANT
ELECT REM PT RETURN 9FT ADLT (ELECTROSURGICAL) ×2
ELECTRODE REM PT RTRN 9FT ADLT (ELECTROSURGICAL) ×1 IMPLANT
GAUZE SPONGE 4X4 12PLY STRL (GAUZE/BANDAGES/DRESSINGS) ×2 IMPLANT
GAUZE SPONGE 4X4 16PLY XRAY LF (GAUZE/BANDAGES/DRESSINGS) IMPLANT
GLOVE ECLIPSE 8.0 STRL XLNG CF (GLOVE) ×2 IMPLANT
GOWN STRL REUS W/ TWL LRG LVL3 (GOWN DISPOSABLE) IMPLANT
GOWN STRL REUS W/ TWL XL LVL3 (GOWN DISPOSABLE) ×1 IMPLANT
GOWN STRL REUS W/TWL 2XL LVL3 (GOWN DISPOSABLE) IMPLANT
GOWN STRL REUS W/TWL LRG LVL3 (GOWN DISPOSABLE)
GOWN STRL REUS W/TWL XL LVL3 (GOWN DISPOSABLE) ×1
KIT BASIN OR (CUSTOM PROCEDURE TRAY) ×2 IMPLANT
KIT ROOM TURNOVER OR (KITS) ×2 IMPLANT
LIQUID BAND (GAUZE/BANDAGES/DRESSINGS) ×2 IMPLANT
NEEDLE HYPO 22GX1.5 SAFETY (NEEDLE) ×2 IMPLANT
NEEDLE SPNL 22GX3.5 QUINCKE BK (NEEDLE) ×4 IMPLANT
NS IRRIG 1000ML POUR BTL (IV SOLUTION) ×2 IMPLANT
PACK LAMINECTOMY NEURO (CUSTOM PROCEDURE TRAY) ×2 IMPLANT
PAD ARMBOARD 7.5X6 YLW CONV (MISCELLANEOUS) ×6 IMPLANT
PATTIES SURGICAL .75X.75 (GAUZE/BANDAGES/DRESSINGS) ×2 IMPLANT
SPONGE SURGIFOAM ABS GEL SZ50 (HEMOSTASIS) ×2 IMPLANT
STRIP CLOSURE SKIN 1/2X4 (GAUZE/BANDAGES/DRESSINGS) ×2 IMPLANT
SUT PROLENE 0 CT 1 30 (SUTURE) IMPLANT
SUT VIC AB 2-0 OS6 18 (SUTURE) ×6 IMPLANT
SUT VIC AB 3-0 CP2 18 (SUTURE) ×2 IMPLANT
SYR 20ML ECCENTRIC (SYRINGE) ×2 IMPLANT
TOWEL OR 17X24 6PK STRL BLUE (TOWEL DISPOSABLE) ×2 IMPLANT
TOWEL OR 17X26 10 PK STRL BLUE (TOWEL DISPOSABLE) ×2 IMPLANT
WATER STERILE IRR 1000ML POUR (IV SOLUTION) ×2 IMPLANT

## 2014-04-25 NOTE — Progress Notes (Signed)
Anesthesia Assessment Pt with likely with untreated HTN and is also a smoker. She has had repolarization abnormality and LVH on prior EKG. Echo in 2011 showed LVH as well. Today she presented with an irregular heart rate and says she has DOE, but denies CP. EKG showed new onset afib. Pt states she has an irregular HR at times but has never had a-fib diagnosed. I believe she requires cardiology evaluation and Rx for her HTN. She needs a stress test at minimum. I spoke with the patient and Dr. Hal Neer about this and we will refer her for cardiology W/U and reschedule surgery after that time.   Lissa Hoard, MD

## 2014-04-25 NOTE — Anesthesia Preprocedure Evaluation (Deleted)
Anesthesia Evaluation  Patient identified by MRN, date of birth, ID band Patient awake    Reviewed: Allergy & Precautions, NPO status , Patient's Chart, lab work & pertinent test results  Airway Mallampati: III  TM Distance: <3 FB Neck ROM: Full    Dental no notable dental hx.    Pulmonary shortness of breath and with exertion, Current Smoker,  breath sounds clear to auscultation  Pulmonary exam normal       Cardiovascular hypertension, Rhythm:Irregular Rate:Normal - Systolic murmurs    Neuro/Psych  Headaches, TIAnegative psych ROS   GI/Hepatic Neg liver ROS, GERD-  ,  Endo/Other  negative endocrine ROS  Renal/GU negative Renal ROS     Musculoskeletal  (+) Arthritis -,   Abdominal   Peds  Hematology negative hematology ROS (+)   Anesthesia Other Findings   Reproductive/Obstetrics negative OB ROS                          Anesthesia Physical Anesthesia Plan  ASA: III  Anesthesia Plan: General   Post-op Pain Management:    Induction: Intravenous  Airway Management Planned: Oral ETT  Additional Equipment:   Intra-op Plan:   Post-operative Plan: Extubation in OR  Informed Consent: I have reviewed the patients History and Physical, chart, labs and discussed the procedure including the risks, benefits and alternatives for the proposed anesthesia with the patient or authorized representative who has indicated his/her understanding and acceptance.   Dental advisory given  Plan Discussed with: CRNA  Anesthesia Plan Comments: (Pt with likely with untreated HTN and is also a smoker. She has had repolarization abnormality and LVH on prior EKG. Echo in 2011 showed LVH as well. Today she presented with an irregular heart rate and says she has DOE, but denies CP. EKG showed new onset afib. Pt states she has an irregular HR at times but has never had a-fib diagnosed. I believe she requires  cardiology evaluation and Rx for her HTN. She needs a stress test at minimum. I spoke with the patient and Dr. Hal Neer about this and we will refer her for cardiology W/U and reschedule surgery after that time. )       Anesthesia Quick Evaluation

## 2014-04-26 NOTE — Discharge Summary (Signed)
PATIENT NAME:  Kayla Maxwell, Kayla Maxwell MR#:  [id number] DATE OF BIRTH:  07-07-1945  DATE OF ADMISSION:  04/27/2012 DATE OF DISCHARGE:  04/29/2012   HISTORY: This is a fairly healthy 69 year old female who presented with complaint of abdominal pain in the epigastric area and right upper quadrant for several days. The pain was persistent and was gradually getting worse. She had some minimal nausea associated, but no other symptoms such as fever or chills.   PAST MEDICAL HISTORY: Includes that of gastroesophageal reflux and some mild asthmatic symptoms for which she was on Singulair. She had unknown documented medical illnesses. The patient had ultrasound showing gallstones.   LABORATORY DATA:  White count 13,000, her lipase and her liver functions were normal.   [place]: On April 25, the patient was taken to the operating room and underwent laparoscopy and cholecystectomy with the finding of an acutely inflamed gallbladder with chronic cholecystitis pre-existing and multiple stones fully impacted in the midportion of the gallbladder down to the Hartmann's pouch. She had a normal intraoperative cholangiogram Because of significant oozing associated with the acute process and the raw surface of the liver bed, the patient had a drain placed.  Her postoperative course was basically uncomplicated. On April 26, the patient in order that her abdominal pain was fully resolved and she felt much better.  Her operative discomfort was minimal and well controlled with oral agent and she was therefore discharged home in stable condition on April 26 to be followed as an outpatient.   FINAL DIAGNOSIS:  Acute cholecystitis and cholelithiasis.   OPERATION PERFORMED: Laparoscopy and cholecystectomy with intraoperative cholangiogram   ____________________________ S.Robinette Haines, MD sgs:cc D: 04/30/2012 21:22:25 ET T: 04/30/2012 21:33:13 ET JOB#: [id number]  cc: Synthia Innocent. Jamal Collin, MD, <Dictator> [facility] Robinette Haines  MD ELECTRONICALLY SIGNED 05/10/2012 7:50

## 2014-04-26 NOTE — Op Note (Signed)
PATIENT NAME:  Kayla Maxwell, Kayla Maxwell MR#:  [id number] DATE OF BIRTH:  Feb 20, 1945  DATE OF PROCEDURE:  04/28/2012  PREOPERATIVE DIAGNOSIS: Acute and chronic cholecystitis with cholelithiasis.   POSTOPERATIVE DIAGNOSIS:  Acute and chronic cholecystitis with cholelithiasis.   OPERATION: Laparoscopy, cholecystectomy with intraoperative cholangiogram.   SURGEON: Mckinley Jewel, MD   ANESTHESIA: General.   COMPLICATIONS: None.   ESTIMATED BLOOD LOSS: Approximately 75 mL.   DRAINS: Blake drain.   DESCRIPTION OF PROCEDURE: The patient was put to sleep in the supine position on the operating table. The abdomen was prepped and draped out as a sterile field. Timeout procedure was performed. A small incision was made in the umbilicus area inferiorly and the incision was deepened through to the fascia which was then lifted up and the Veress needle with the InnerDyne sleeve was positioned in the peritoneal cavity. This was verified with the hanging drop method. Pneumoperitoneum was obtained and a 10 mm port was placed. The camera was placed through the peritoneal cavity with good visualization. Epigastric and 2 lateral 5 mm ports were then placed.   The gallbladder was draped over by the omentum and the adjoining portion of the transverse colon and mesocolon. This had to be peeled off very gradually with a moderate amount of oozing noted from inflamed surface of the gallbladder. The gallbladder was markedly distended and contained stones from the midportion down to the cystic duct area. Accordingly, a needle trocar was used to decompress the gallbladder to allow manipulation and mucoid bile-stained material was obtained. After about 10 mL of this was aspirated, the gallbladder was easier to manipulate. The Hartmann's pouch was pulled up and the cystic duct was identified. This was freed.  It was then proximally hemoclipped and a small opening was made. Through this a Reddick cholangiogram catheter was positioned and  intraoperative cholangiogram was performed. There was prompt filling of the bile duct both proximal and distal including the hepatic radicals. No filling defects were noted and there was no obstruction to flow into the duodenum. The catheter was removed. The cystic duct was hemoclipped and cut. Further dissection was performed and the cystic artery was identified. It was hemoclipped and cut. The gallbladder was dissected free from the bed. This was somewhat tedious because of the fact that it was acutely and chronically inflamed with adherence to the liver bed.  The raw surface of the liver bed had some oozing and, therefore, the procedure was slow.  After the gallbladder was freed, the gallbladder bed was inspected and bleeding points were cauterized. To minimize further oozing this area was covered with 5 mL of Surgiflo containing thrombin.  A small amount of fluid was used to irrigate out the right upper quadrant and suctioned out. Using a 5 mm scope through the epigastric port site, the gallbladder was placed in a retrieval bag and brought out through the umbilical port site. It required slight extension of both the skin and the fascial incision to remove this and contained multiple stones, completely packing the distal half of the gallbladder.   The drain was then positioned through this opening into the peritoneal cavity and with the umbilical port to adequately control and minimize air leak. The drain was brought out through the lateral port site incision and positioned underneath the liver. It was then fastened to the skin with a nylon stitch. Using suture passers, the fascial defect at the umbilicus was closed with 3 stitches of 0 Vicryl. Pneumoperitoneum was released. The ports were  removed. All the remaining skin incisions were closed with subcuticular 4-0 Vicryl. This was reinforced with Steri-Strips and dry sterile dressing was placed. The patient tolerated the procedure well. There were no immediate  problems encountered. She was subsequently extubated and returned to the recovery room in stable condition.    ____________________________ S.Robinette Haines, MD sgs:cs D: 04/30/2012 18:13:00 ET T: 04/30/2012 18:33:46 ET JOB#: [id number]  cc: S.G. Jamal Collin, MD, <Dictator> [facility] Robinette Haines MD ELECTRONICALLY SIGNED 04/30/2012 19:17

## 2014-04-27 NOTE — Discharge Summary (Signed)
PATIENT NAME:  Kayla Maxwell, SABAN MR#:  [id number] DATE OF BIRTH:  29-Apr-1945  PRESENTING COMPLAINT: Disorientation for about 15 minutes.   DISCHARGE DIAGNOSES: 1.   Transient global amnesia for about 15 minutes.  2.  Old cerebrovascular accident seen on MRI involving the anterior right internal capsule, no acute cerebrovascular accident.  3.  Sinusitis and bronchitis.  4.  Gastroesophageal reflux disease.  5.  Obstructive sleep apnea on CPAP.  6.  Ongoing tobacco abuse.  7.  History of coronary artery disease.   CONSULTATIONS: None.   PROCEDURES:   1.  CT of the head November 6 without contrast shows no acute intracranial findings.  2.  Chest x-ray November 6, shows no evidence of acute cardiopulmonary disease.  3.  MRI of the brain with and without contrast November 7 shows no acute intracranial abnormality. Remote lacunar infarct involving the anterior limb of the right internal capsule. Polyp or mucous retention cyst in the right maxillary sinus with evidence of chronic right maxillary sinus disease.   HISTORY OF PRESENT ILLNESS: This very pleasant 69 year old woman with past medical history of coronary artery disease and ongoing tobacco abuse, presents to the Emergency Room with concern that she had lost about 15 minutes of time. Per the family, she was talking and acting normally during that time.  She states that she was feeling fairly ill with symptoms of a sinus infection. She had been taking Sudafed and Mucinex. She was with her daughter and called her daughter's office to set up an appointment. She then became slightly confused and told her daughter that she did not know how to get to her doctor's office, a place that she has visited many times. The patient does not remember about 15 minutes of time during which she was acting and interacting normally. She did have a right-sided headache. She presents to the Emergency Room for further evaluation.   HOSPITAL COURSE BY PROBLEM:  Problem  #1. Transient global amnesia: At the time of presentation, the patient is cognitively intact and her neurologic exam was normal. CT of the head was negative. MRI showed evidence of an old CVA, but no acute infarct. It also supported diagnosis of sinusitis. It seems very possible that her current sinusitis, bronchitis, and use of over-the-counter cold medications including Sudafed could contribute to her symptoms. She spent one night in the hospital and had no recurrence of symptoms. At the time of discharge, she is at her baseline and feeling very comfortable.  Problem #2.  Old CVA seen on MRI involving the anterior right internal capsule. Lipids checked during this hospitalization showed an LDL of 117.  Given the history of CVA, this is too high.  She was started on a statin and daily aspirin. Blood pressures were well controlled throughout the hospitalization and no changes were made regarding her blood pressure. She was encouraged to stop smoking. She can follow up with her primary care provider for further evaluation and secondary prevention measures.  Problem #3.  Sinusitis and bronchitis: She was breathing normally throughout the hospitalization and did not need supplemental oxygen. She is discharged with azithromycin.  Problem #4.  Gastroesophageal reflux disease: Controlled. Continue PPI.  Problem #5.  Obstructive sleep apnea: Continue CPAP.  Problem #6.  Ongoing tobacco abuse: Smoking cessation counseling was provided during this hospitalization, especially in light of her old CVA it is important for her to stop smoking to decrease risk of recurrent stroke.   DISCHARGE EXAMINATION: VITAL SIGNS: Temperature 97.9, pulse 75,  respirations 18, blood pressure 145/76, oxygenation 94% on room air.  GENERAL: No acute distress.  CARDIOVASCULAR: Regular rate and rhythm, no murmurs, rubs, or gallops.  PULMONARY: Lungs are clear to auscultation bilaterally with good air movement.  NEUROLOGIC: Cranial nerves  II through XII are grossly intact. Strength and sensation are intact with good tone. Nonfocal neurologic examination.  PSYCHIATRIC: The patient is alert and oriented x 4 with good insight into her clinical condition.  LABORATORY DATA: Sodium 141, potassium 4.2, chloride 112, BUN 14, creatinine 0.73, glucose 146, total cholesterol 201, LDL 117, HDL 45, troponin less than 0.02. TSH 0.446. White blood cell count 5.4, hemoglobin 15.8, platelets 192,000, MCV is 93. UA shows no sign of infection.    DISCHARGE MEDICATIONS: 1.  Nexium 40 mg 1 capsule once a day.  2.  Docusate sodium 50 mg 2 capsules once a day.  3.  Ibuprofen 200 mg 3 tablets orally every 6 hours as needed.  4.  Guaifenesin 60 mg 1 tablet every 12 hours as needed.  5.  Azithromycin 250 mg oral tablet 1 tablet once a day for 4 days and then stop.  6.  Pravachol 40 mg 1 tablet once a day at bedtime.  7.  Aspirin 81 mg 1 tablet once a day.   CONDITION ON DISCHARGE: Alert and stable.   DISPOSITION: Discharged to home. No home health needs.   DIET: Heart healthy diet, low fat, low cholesterol.   ACTIVITY: No restrictions.   TIMEFRAME FOR FOLLOWUP: Follow up with Dr. Venia Minks within 1-2 weeks of discharge.   TIME SPENT ON DISCHARGE: 40 minutes.    ____________________________ Earleen Newport. Volanda Napoleon, MD cpw:LT D: 11/11/2013 20:13:41 ET T: 11/11/2013 20:27:46 ET JOB#: [id number]  cc: Earleen Newport. Volanda Napoleon, MD, <Dictator> Jerrell Belfast, MD Aldean Jewett MD ELECTRONICALLY SIGNED 11/16/2013 11:45

## 2014-04-27 NOTE — H&P (Signed)
PATIENT NAME:  Kayla Maxwell, Kayla Maxwell MR#:  [id number] DATE OF BIRTH:  14-Jul-1945  DATE OF ADMISSION:  11/09/2013  PRIMARY CARE PHYSICIAN: Jerrell Belfast, MD.   CHIEF COMPLAINT: Transient memory loss.   HISTORY OF PRESENT ILLNESS: This is a 69 year old female who states that she lost about 15 minutes of time. As per the family, she was talking and acting normal and moving around normal. She states that she has been having a sinus cold and coughing and she was taking some Mucinex and some Sudafed. She called her doctor's office and set up an appointment for 11:45. She told her daughter that she does not know how to get to the doctor. She was moving around fine and motor skills were fine, but she does not remember about 15 minutes of time frame. She did have a right headache. She got over to Dr. Sharyon Medicus office and Dr. Venia Minks sent her to the ER for further evaluation. The patient feels fine at this point in time, remembers bits and parts of being in Dr. Sharyon Medicus office, but the 15 to 20 minutes prior does not recall anything from that time frame. Hospitalist services were contacted for further evaluation.   PAST MEDICAL HISTORY: Pain on the left leg. Mild heart attack in the past. Gastroesophageal reflux disease and sleep apnea.   PAST SURGICAL HISTORY: Cholecystectomy, appendectomy, hysterectomy, tonsillectomy, C-section.   ALLERGIES: PENICILLIN, SULFA, CODEINE AND CODAFED.   MEDICATIONS: What the patient takes on a daily basis include Colace 50 mg 2 tablets daily, guaifenesin 600 mg twice a day, ibuprofen 200 mg 3 tablets every 6 hours as needed, Nexium 40 mg daily.   SOCIAL HISTORY: Smokes 1 pack per day for the last 46.5 years. No alcohol. No drug use. 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   FAMILY HISTORY: Father died at 60 of an MI. Mother died at 79 of Alzheimers. Sister died of pancreatic cancer.   REVIEW OF SYSTEMS:  CONSTITUTIONAL: Positive for a 40 pound weight gain in  8 years. No fever, chills or sweats.  EYES: She does have runny eyes and a runny nose. She does wear glasses.  EARS, NOSE, MOUTH AND THROAT: Positive for runny nose. No sore throat. No difficulty swallowing.  CARDIOVASCULAR: Positive for chest pain with cough only. No palpitations.  RESPIRATORY: Positive for shortness of breath. Positive for cough. No phlegm. One episode where she may have coughed up a little blood today.  GASTROINTESTINAL: Positive for vomiting with coughing fits. Lower abdominal pain on the left side. No diarrhea. No constipation.  GENITOURINARY: No burning on urination or hematuria.  MUSCULOSKELETAL: Positive for left leg pain.  INTEGUMENT: No rashes or eruptions.  NEUROLOGIC: No fainting or blackouts.  PSYCHIATRIC: No anxiety or depression.  ENDOCRINE: No thyroid problems.  HEMATOLOGIC AND LYMPHATIC: No anemia, no easy bruising or bleeding.   PHYSICAL EXAMINATION: VITAL SIGNS: Temperature 97.6, pulse 77, respirations 20, blood pressure 177/85, pulse oximetry 97% on room air.  GENERAL: No respiratory distress.  EYES: Conjunctivae and lids normal. Pupils equal, round, and reactive to light. Extraocular muscles intact. No nystagmus.  EARS, NOSE, MOUTH AND THROAT: Tympanic membranes: No erythema. Nasal mucosa: No erythema. Throat: Slight erythema. No exudate seen. Lips and gums: No lesions.  NECK: No JVD. No bruits. No lymphadenopathy. No thyromegaly. No thyroid nodules palpated.  RESPIRATORY: Decreased breath sounds bilaterally. Positive wheeze throughout bilateral lung fields. No rhonchi or rales heard.  CARDIOVASCULAR: S1, S2 normal. No gallops, rubs or murmurs heard. Carotid  upstroke 2+ bilaterally. No bruits. Dorsalis pedis pulses 2+ bilaterally. Trace edema of the lower extremities.  ABDOMEN: Soft, nontender. No organomegaly/splenomegaly. Normoactive bowel sounds. No masses felt.  LYMPHATIC: No lymph nodes in the neck.  MUSCULOSKELETAL: No clubbing, edema or cyanosis.   SKIN: No rashes or lesions seen.  NEUROLOGIC: Cranial nerves 2 through 12 grossly intact. Deep tendon reflexes 2+ bilateral lower extremities.  PSYCHIATRIC: The patient is oriented to person, place and time.   RADIOLOGICAL DATA: Chest x-ray negative. CT scan of the head negative. White blood cell count 10.5, hemoglobin and hematocrit 16.6 and 50.0, platelet count 199,000. Glucose 98, BUN 19, creatinine 0.95, sodium 142, potassium 4.9, chloride 109, CO2 of 26, calcium 8.3. Troponin negative. Urinalysis 1+ blood.   RADIOLOGIC DATA: EKG: Sinus rhythm, short PR interval, LVH.   ASSESSMENT AND PLAN: 1. Transient global amnesia versus a transient ischemic attack. Will admit as an observation, get an MRI of the brain. If MRI is negative, can probably be sent home. If MRI shows a stroke, may end up needing carotid ultrasound and echocardiogram but will hold off on that testing at this time.  2. Mild chronic obstructive pulmonary disease exacerbation. Will give a couple doses of Solu-Medrol, DuoNeb nebulizer solution and Zithromax.  3. Gastroesophageal reflux disease on Nexium as outpatient. We will give Protonix here.  4. Sleep apnea on CPAP.  5. Tobacco abuse. Smoking cessation counseling done 3 minutes by me. Nicotine patch applied.  6. Polycythemia. This could be secondary to smoking versus dehydration. Will give a liter of IV fluid and recheck a CBC in the a.m.   TIME SPENT: On admission 55 minutes.   CODE STATUS: The patient wishes to be a do not resuscitate.     ____________________________ Tana Conch. Leslye Peer, MD rjw:TT D: 11/09/2013 19:10:27 ET T: 11/09/2013 19:54:41 ET JOB#: [id number]  cc: Tana Conch. Leslye Peer, MD, <Dictator> Jerrell Belfast, MD Marisue Brooklyn MD ELECTRONICALLY SIGNED 11/15/2013 1:36

## 2014-04-29 DIAGNOSIS — F17210 Nicotine dependence, cigarettes, uncomplicated: Secondary | ICD-10-CM | POA: Insufficient documentation

## 2014-04-29 DIAGNOSIS — K219 Gastro-esophageal reflux disease without esophagitis: Secondary | ICD-10-CM | POA: Insufficient documentation

## 2014-04-29 DIAGNOSIS — Z87891 Personal history of nicotine dependence: Secondary | ICD-10-CM | POA: Insufficient documentation

## 2014-04-29 DIAGNOSIS — G4733 Obstructive sleep apnea (adult) (pediatric): Secondary | ICD-10-CM | POA: Insufficient documentation

## 2014-04-29 DIAGNOSIS — F17219 Nicotine dependence, cigarettes, with unspecified nicotine-induced disorders: Secondary | ICD-10-CM | POA: Insufficient documentation

## 2014-04-29 DIAGNOSIS — I4891 Unspecified atrial fibrillation: Secondary | ICD-10-CM | POA: Insufficient documentation

## 2014-05-08 DIAGNOSIS — I4891 Unspecified atrial fibrillation: Secondary | ICD-10-CM | POA: Insufficient documentation

## 2014-05-08 DIAGNOSIS — I340 Nonrheumatic mitral (valve) insufficiency: Secondary | ICD-10-CM | POA: Insufficient documentation

## 2014-05-08 DIAGNOSIS — I482 Chronic atrial fibrillation, unspecified: Secondary | ICD-10-CM | POA: Insufficient documentation

## 2014-05-08 DIAGNOSIS — I071 Rheumatic tricuspid insufficiency: Secondary | ICD-10-CM | POA: Insufficient documentation

## 2014-05-10 ENCOUNTER — Other Ambulatory Visit: Payer: Self-pay | Admitting: Neurosurgery

## 2014-06-21 ENCOUNTER — Other Ambulatory Visit: Payer: Self-pay | Admitting: Family Medicine

## 2014-06-21 DIAGNOSIS — E785 Hyperlipidemia, unspecified: Secondary | ICD-10-CM

## 2014-06-24 NOTE — Pre-Procedure Instructions (Addendum)
Jailen Lung Klabunde  06/24/2014      [facility], [place] - [address] [place] [address] Phone: [phone] Fax: [phone]    Your procedure is scheduled on : June 30th, Thursday.   Report to [facility] Admitting at 5:30 A.M.  Call this number if you have problems the morning of surgery:  [phone]   Remember:  Do not eat food or drink liquids after midnight Wednesday..  Take these medicines the morning of surgery with A SIP OF WATER: Metoprolol, Nexium.   Do not wear jewelry, make-up or nail polish.  Do not wear lotions, powders, or perfumes.  You may NOT wear deodorant the day of surgery..  Do not shave underarms & legs 48 hours prior to surgery.       Do not bring valuables to the hospital.  [facility] is not responsible for any belongings or valuables.  Contacts, dentures or bridgework may not be worn into surgery.  Leave your suitcase in the car.  After surgery it may be brought to your room. For patients admitted to the hospital, discharge time will be determined by your treatment team.  Name and phone number of your driver:     Special instructions:  "Preparing for Surgery" instruction sheet.  Please read over the following fact sheets that you were given. Pain Booklet, Coughing and Deep Breathing, MRSA Information and Surgical Site Infection Prevention

## 2014-06-25 ENCOUNTER — Encounter (HOSPITAL_COMMUNITY): Payer: Self-pay

## 2014-06-25 ENCOUNTER — Encounter (HOSPITAL_COMMUNITY)
Admission: RE | Admit: 2014-06-25 | Discharge: 2014-06-25 | Disposition: A | Discharge status: Home / Self Care | Payer: Medicare Other | Source: Ambulatory Visit | Attending: Neurosurgery | Admitting: Neurosurgery

## 2014-06-25 DIAGNOSIS — F17200 Nicotine dependence, unspecified, uncomplicated: Secondary | ICD-10-CM | POA: Insufficient documentation

## 2014-06-25 DIAGNOSIS — G4733 Obstructive sleep apnea (adult) (pediatric): Secondary | ICD-10-CM | POA: Insufficient documentation

## 2014-06-25 DIAGNOSIS — E785 Hyperlipidemia, unspecified: Secondary | ICD-10-CM | POA: Diagnosis not present

## 2014-06-25 DIAGNOSIS — I4891 Unspecified atrial fibrillation: Secondary | ICD-10-CM | POA: Diagnosis not present

## 2014-06-25 DIAGNOSIS — Z01818 Encounter for other preprocedural examination: Secondary | ICD-10-CM | POA: Diagnosis present

## 2014-06-25 DIAGNOSIS — Z8673 Personal history of transient ischemic attack (TIA), and cerebral infarction without residual deficits: Secondary | ICD-10-CM | POA: Diagnosis not present

## 2014-06-25 DIAGNOSIS — Z01812 Encounter for preprocedural laboratory examination: Secondary | ICD-10-CM | POA: Insufficient documentation

## 2014-06-25 DIAGNOSIS — Z8542 Personal history of malignant neoplasm of other parts of uterus: Secondary | ICD-10-CM | POA: Diagnosis not present

## 2014-06-25 HISTORY — DX: Sleep apnea, unspecified: G47.30

## 2014-06-25 HISTORY — DX: Cardiac arrhythmia, unspecified: I49.9

## 2014-06-25 HISTORY — DX: Cerebral infarction, unspecified: I63.9

## 2014-06-25 HISTORY — DX: Malignant (primary) neoplasm, unspecified: C80.1

## 2014-06-25 LAB — COMPREHENSIVE METABOLIC PANEL
ALT: 20 U/L (ref 14–54)
AST: 18 U/L (ref 15–41)
Albumin: 3.7 g/dL (ref 3.5–5.0)
Alkaline Phosphatase: 72 U/L (ref 38–126)
Anion gap: 7 (ref 5–15)
BUN: 17 mg/dL (ref 6–20)
CO2: 29 mmol/L (ref 22–32)
Calcium: 9 mg/dL (ref 8.9–10.3)
Chloride: 105 mmol/L (ref 101–111)
Creatinine, Ser: 0.96 mg/dL (ref 0.44–1.00)
GFR calc Af Amer: >60 mL/min (ref >60)
GFR calc non Af Amer: 59 mL/min — ABNORMAL LOW (ref >60)
Glucose, Bld: 116 mg/dL — ABNORMAL HIGH (ref 65–99)
Potassium: 4.2 mmol/L (ref 3.5–5.1)
Sodium: 141 mmol/L (ref 135–145)
Total Bilirubin: 0.5 mg/dL (ref 0.3–1.2)
Total Protein: 6.2 g/dL — ABNORMAL LOW (ref 6.5–8.1)

## 2014-06-25 LAB — CBC
HCT: 50.4 % — ABNORMAL HIGH (ref 36.0–46.0)
Hemoglobin: 17.1 g/dL — ABNORMAL HIGH (ref 12.0–15.0)
MCH: 31.4 pg (ref 26.0–34.0)
MCHC: 33.9 g/dL (ref 30.0–36.0)
MCV: 92.6 fL (ref 78.0–100.0)
Platelets: 238 10*3/uL (ref 150–400)
RBC: 5.44 MIL/uL — ABNORMAL HIGH (ref 3.87–5.11)
RDW: 13 % (ref 11.5–15.5)
WBC: 8.8 10*3/uL (ref 4.0–10.5)

## 2014-06-25 LAB — SURGICAL PCR SCREEN
MRSA, PCR: NEGATIVE
Staphylococcus aureus: NEGATIVE

## 2014-06-25 NOTE — Progress Notes (Signed)
Anesthesia Chart Review:  Pt is 69 year old female scheduled for L4-5 laminectomy and foraminotomy on 07/04/2014 with Dr. Hal Neer.   Cardiologist is Dr. Serafina Royals at [place], see care everywhere.   PMH includes: atrial fibrillation, OSA (on CPAP), hyperlipidemia, TIA (x2), uterine cancer. Current smoker. BMI 31.  Medications include: Metoprolol, pravastatin, xarelto. Pt to stop xarelto 3 days prior to surgery.   Preoperative labs noted.   EKG 04/25/2014: Atrial fibrillation.ST & T wave abnormality, consider inferolateral ischemia. Prolonged Q  Echo 05/08/2014: -Normal LV systolic function with mild LVH -Normal RV systolic function -Moderate mitral and tricuspid regurgitation -no valvular stenosis  Pt was previously scheduled for this surgery in April 2016 but it was cancelled when she was found to have new afib on DOS. Pt has since then seen cardiology. Dr. Nehemiah Massed has cleared pt for surgery (see office visit in care everywhere 06/24/2014).   If no changes, I anticipate pt can proceed with surgery as scheduled.   Willeen Cass, FNP-BC [facility]/Anesthesiology Phone: [phone] 06/25/2014 2:39 PM

## 2014-06-25 NOTE — Progress Notes (Addendum)
Patient since last visit has seen Dr. Nehemiah Massed in [place].  Echo done, Ekg was done yesterday, will request a copy. Started on xarelto and metoprolol.  Was instructed by Dr. Nehemiah Massed to stop xarelto 3 days prior to surgery.   Has had strokes in past.  Last yrs' stroke left her with memory loss for 4-6 hrs per her daughter.  Things are fine now. Had sleep study done "yrs ago", wears CPAP but doesn't know the settings. PCP is Dr. Margarita Rana, at [facility].

## 2014-07-03 MED ORDER — VANCOMYCIN HCL IN DEXTROSE 1-5 GM/200ML-% IV SOLN
1000.0000 mg | INTRAVENOUS | Status: AC
  Administered 2014-07-04: 1000 mg via INTRAVENOUS
  Filled 2014-07-03: qty 200

## 2014-07-03 MED ORDER — DEXAMETHASONE SODIUM PHOSPHATE 10 MG/ML IJ SOLN
10.0000 mg | INTRAMUSCULAR | Status: DC
  Filled 2014-07-03: qty 1

## 2014-07-03 NOTE — Progress Notes (Signed)
Notified pt. Of time change. Instructed to arrive at 0730.

## 2014-07-04 ENCOUNTER — Inpatient Hospital Stay (HOSPITAL_COMMUNITY): Payer: Medicare Other

## 2014-07-04 ENCOUNTER — Inpatient Hospital Stay (HOSPITAL_COMMUNITY)
Admission: RE | Admit: 2014-07-04 | Discharge: 2014-07-05 | DRG: 518 | Disposition: A | Discharge status: Home / Self Care | Payer: Medicare Other | Source: Ambulatory Visit | Attending: Neurosurgery | Admitting: Neurosurgery

## 2014-07-04 ENCOUNTER — Inpatient Hospital Stay (HOSPITAL_COMMUNITY): Payer: Medicare Other | Admitting: Emergency Medicine

## 2014-07-04 ENCOUNTER — Encounter (HOSPITAL_COMMUNITY)
Admission: RE | Disposition: A | Discharge status: Home / Self Care | Payer: Self-pay | Source: Ambulatory Visit | Attending: Neurosurgery

## 2014-07-04 ENCOUNTER — Encounter (HOSPITAL_COMMUNITY): Payer: Self-pay | Admitting: *Deleted

## 2014-07-04 ENCOUNTER — Inpatient Hospital Stay (HOSPITAL_COMMUNITY): Payer: Medicare Other | Admitting: Certified Registered Nurse Anesthetist

## 2014-07-04 DIAGNOSIS — Z882 Allergy status to sulfonamides status: Secondary | ICD-10-CM | POA: Diagnosis not present

## 2014-07-04 DIAGNOSIS — Z8542 Personal history of malignant neoplasm of other parts of uterus: Secondary | ICD-10-CM | POA: Diagnosis not present

## 2014-07-04 DIAGNOSIS — K219 Gastro-esophageal reflux disease without esophagitis: Secondary | ICD-10-CM | POA: Diagnosis present

## 2014-07-04 DIAGNOSIS — Z79899 Other long term (current) drug therapy: Secondary | ICD-10-CM | POA: Diagnosis not present

## 2014-07-04 DIAGNOSIS — I4891 Unspecified atrial fibrillation: Secondary | ICD-10-CM | POA: Diagnosis not present

## 2014-07-04 DIAGNOSIS — Z90710 Acquired absence of both cervix and uterus: Secondary | ICD-10-CM

## 2014-07-04 DIAGNOSIS — E785 Hyperlipidemia, unspecified: Secondary | ICD-10-CM | POA: Diagnosis present

## 2014-07-04 DIAGNOSIS — Z880 Allergy status to penicillin: Secondary | ICD-10-CM

## 2014-07-04 DIAGNOSIS — Z7901 Long term (current) use of anticoagulants: Secondary | ICD-10-CM | POA: Diagnosis not present

## 2014-07-04 DIAGNOSIS — K5900 Constipation, unspecified: Secondary | ICD-10-CM | POA: Diagnosis present

## 2014-07-04 DIAGNOSIS — F17210 Nicotine dependence, cigarettes, uncomplicated: Secondary | ICD-10-CM | POA: Diagnosis present

## 2014-07-04 DIAGNOSIS — Z8673 Personal history of transient ischemic attack (TIA), and cerebral infarction without residual deficits: Secondary | ICD-10-CM | POA: Diagnosis not present

## 2014-07-04 DIAGNOSIS — Z885 Allergy status to narcotic agent status: Secondary | ICD-10-CM | POA: Diagnosis not present

## 2014-07-04 DIAGNOSIS — M48061 Spinal stenosis, lumbar region without neurogenic claudication: Secondary | ICD-10-CM | POA: Diagnosis present

## 2014-07-04 DIAGNOSIS — M4806 Spinal stenosis, lumbar region: Principal | ICD-10-CM | POA: Diagnosis present

## 2014-07-04 DIAGNOSIS — M549 Dorsalgia, unspecified: Secondary | ICD-10-CM | POA: Diagnosis present

## 2014-07-04 DIAGNOSIS — Z419 Encounter for procedure for purposes other than remedying health state, unspecified: Secondary | ICD-10-CM

## 2014-07-04 HISTORY — PX: LUMBAR LAMINECTOMY WITH COFLEX 1 LEVEL: SHX6514

## 2014-07-04 LAB — PROTIME-INR
INR: 0.98 (ref 0.00–1.49)
Prothrombin Time: 13.2 seconds (ref 11.6–15.2)

## 2014-07-04 SURGERY — LUMBAR LAMINECTOMY WITH COFLEX 1 LEVEL
Anesthesia: General | Site: Back | Laterality: Bilateral

## 2014-07-04 MED ORDER — KCL IN DEXTROSE-NACL 20-5-0.45 MEQ/L-%-% IV SOLN
INTRAVENOUS | Status: AC
  Filled 2014-07-04: qty 1000

## 2014-07-04 MED ORDER — PANTOPRAZOLE SODIUM 40 MG PO TBEC
40.0000 mg | DELAYED_RELEASE_TABLET | Freq: Every day | ORAL | Status: DC
  Administered 2014-07-04: 40 mg via ORAL
  Filled 2014-07-04: qty 1

## 2014-07-04 MED ORDER — KETOROLAC TROMETHAMINE 0.5 % OP SOLN
1.0000 [drp] | Freq: Four times a day (QID) | OPHTHALMIC | Status: DC | PRN
  Administered 2014-07-04: 1 [drp] via OPHTHALMIC
  Filled 2014-07-04 (×2): qty 3

## 2014-07-04 MED ORDER — BUPIVACAINE HCL (PF) 0.5 % IJ SOLN
INTRAMUSCULAR | Status: DC | PRN
  Administered 2014-07-04: 10 mL

## 2014-07-04 MED ORDER — PHENOL 1.4 % MT LIQD: 1.0000 | OROMUCOSAL | Status: DC | PRN

## 2014-07-04 MED ORDER — VANCOMYCIN HCL IN DEXTROSE 1-5 GM/200ML-% IV SOLN
1000.0000 mg | Freq: Two times a day (BID) | INTRAVENOUS | Status: DC
  Administered 2014-07-04: 1000 mg via INTRAVENOUS
  Filled 2014-07-04 (×3): qty 200

## 2014-07-04 MED ORDER — FENTANYL CITRATE (PF) 250 MCG/5ML IJ SOLN
INTRAMUSCULAR | Status: AC
  Filled 2014-07-04: qty 5

## 2014-07-04 MED ORDER — SODIUM CHLORIDE 0.9 % IJ SOLN: 3.0000 mL | INTRAMUSCULAR | Status: DC | PRN

## 2014-07-04 MED ORDER — SODIUM CHLORIDE 0.9 % IJ SOLN
3.0000 mL | Freq: Two times a day (BID) | INTRAMUSCULAR | Status: DC
  Administered 2014-07-04: 3 mL via INTRAVENOUS

## 2014-07-04 MED ORDER — BISACODYL 5 MG PO TBEC
5.0000 mg | DELAYED_RELEASE_TABLET | Freq: Every day | ORAL | Status: DC | PRN
  Filled 2014-07-04: qty 1

## 2014-07-04 MED ORDER — THROMBIN 5000 UNITS EX SOLR
CUTANEOUS | Status: DC | PRN
  Administered 2014-07-04 (×2): 5000 [IU] via TOPICAL

## 2014-07-04 MED ORDER — HYDROMORPHONE HCL 1 MG/ML IJ SOLN: 1.0000 mg | INTRAMUSCULAR | Status: DC | PRN

## 2014-07-04 MED ORDER — METOPROLOL TARTRATE 25 MG PO TABS
25.0000 mg | ORAL_TABLET | Freq: Two times a day (BID) | ORAL | Status: DC
  Administered 2014-07-04 – 2014-07-05 (×2): 25 mg via ORAL
  Filled 2014-07-04 (×3): qty 1

## 2014-07-04 MED ORDER — HYDROMORPHONE HCL 1 MG/ML IJ SOLN
INTRAMUSCULAR | Status: AC
  Administered 2014-07-04: 0.5 mg via INTRAVENOUS
  Filled 2014-07-04: qty 1

## 2014-07-04 MED ORDER — PROMETHAZINE HCL 25 MG/ML IJ SOLN: 6.2500 mg | INTRAMUSCULAR | Status: DC | PRN

## 2014-07-04 MED ORDER — SODIUM CHLORIDE 0.9 % IR SOLN
Status: DC | PRN
  Administered 2014-07-04: 500 mL

## 2014-07-04 MED ORDER — SUCCINYLCHOLINE CHLORIDE 20 MG/ML IJ SOLN
INTRAMUSCULAR | Status: DC | PRN
  Administered 2014-07-04: 120 mg via INTRAVENOUS

## 2014-07-04 MED ORDER — 0.9 % SODIUM CHLORIDE (POUR BTL) OPTIME
TOPICAL | Status: DC | PRN
  Administered 2014-07-04: 1000 mL

## 2014-07-04 MED ORDER — MENTHOL 3 MG MT LOZG: 1.0000 | LOZENGE | OROMUCOSAL | Status: DC | PRN

## 2014-07-04 MED ORDER — HEMOSTATIC AGENTS (NO CHARGE) OPTIME
TOPICAL | Status: DC | PRN
  Administered 2014-07-04: 1 via TOPICAL

## 2014-07-04 MED ORDER — PROPOFOL 10 MG/ML IV BOLUS
INTRAVENOUS | Status: AC
  Filled 2014-07-04: qty 20

## 2014-07-04 MED ORDER — ACETAMINOPHEN 650 MG RE SUPP: 650.0000 mg | RECTAL | Status: DC | PRN

## 2014-07-04 MED ORDER — PHENYLEPHRINE HCL 10 MG/ML IJ SOLN
10.0000 mg | INTRAVENOUS | Status: DC | PRN
  Administered 2014-07-04: 25 ug/min via INTRAVENOUS

## 2014-07-04 MED ORDER — LACTATED RINGERS IV SOLN
INTRAVENOUS | Status: DC
  Administered 2014-07-04: 08:00:00 via INTRAVENOUS

## 2014-07-04 MED ORDER — PANTOPRAZOLE SODIUM 40 MG IV SOLR: 40.0000 mg | Freq: Every day | INTRAVENOUS | Status: DC

## 2014-07-04 MED ORDER — METHOCARBAMOL 500 MG PO TABS
ORAL_TABLET | ORAL | Status: AC
  Administered 2014-07-04: 500 mg via ORAL
  Filled 2014-07-04: qty 1

## 2014-07-04 MED ORDER — KETOROLAC TROMETHAMINE 30 MG/ML IJ SOLN
30.0000 mg | Freq: Four times a day (QID) | INTRAMUSCULAR | Status: DC
  Administered 2014-07-04 – 2014-07-05 (×4): 30 mg via INTRAVENOUS
  Filled 2014-07-04 (×7): qty 1

## 2014-07-04 MED ORDER — ACETAMINOPHEN 325 MG PO TABS: 650.0000 mg | ORAL_TABLET | ORAL | Status: DC | PRN

## 2014-07-04 MED ORDER — GLYCOPYRROLATE 0.2 MG/ML IJ SOLN
INTRAMUSCULAR | Status: DC | PRN
  Administered 2014-07-04: 0.4 mg via INTRAVENOUS

## 2014-07-04 MED ORDER — HYDROMORPHONE HCL 1 MG/ML IJ SOLN
0.2500 mg | INTRAMUSCULAR | Status: DC | PRN
  Administered 2014-07-04 (×4): 0.5 mg via INTRAVENOUS

## 2014-07-04 MED ORDER — MONTELUKAST SODIUM 10 MG PO TABS
10.0000 mg | ORAL_TABLET | Freq: Every day | ORAL | Status: DC | PRN
  Filled 2014-07-04: qty 1

## 2014-07-04 MED ORDER — METHOCARBAMOL 500 MG PO TABS
500.0000 mg | ORAL_TABLET | Freq: Four times a day (QID) | ORAL | Status: DC | PRN
  Administered 2014-07-04: 500 mg via ORAL

## 2014-07-04 MED ORDER — FENTANYL CITRATE (PF) 100 MCG/2ML IJ SOLN
INTRAMUSCULAR | Status: DC | PRN
Start: 2014-07-04 — End: 2014-07-04
  Administered 2014-07-04: 100 ug via INTRAVENOUS

## 2014-07-04 MED ORDER — ONDANSETRON HCL 4 MG/2ML IJ SOLN
INTRAMUSCULAR | Status: DC | PRN
  Administered 2014-07-04: 4 mg via INTRAVENOUS

## 2014-07-04 MED ORDER — DEXAMETHASONE SODIUM PHOSPHATE 4 MG/ML IJ SOLN
4.0000 mg | Freq: Four times a day (QID) | INTRAMUSCULAR | Status: AC
  Administered 2014-07-04: 4 mg via INTRAVENOUS
  Filled 2014-07-04: qty 1

## 2014-07-04 MED ORDER — DOCUSATE SODIUM 100 MG PO CAPS
100.0000 mg | ORAL_CAPSULE | Freq: Two times a day (BID) | ORAL | Status: DC
  Administered 2014-07-04: 100 mg via ORAL
  Filled 2014-07-04: qty 1

## 2014-07-04 MED ORDER — LIDOCAINE HCL (CARDIAC) 20 MG/ML IV SOLN
INTRAVENOUS | Status: DC | PRN
  Administered 2014-07-04: 60 mg via INTRAVENOUS

## 2014-07-04 MED ORDER — ROCURONIUM BROMIDE 100 MG/10ML IV SOLN
INTRAVENOUS | Status: DC | PRN
  Administered 2014-07-04: 40 mg via INTRAVENOUS

## 2014-07-04 MED ORDER — DEXAMETHASONE 4 MG PO TABS: 4.0000 mg | ORAL_TABLET | Freq: Four times a day (QID) | ORAL | Status: AC

## 2014-07-04 MED ORDER — PROPOFOL 10 MG/ML IV BOLUS
INTRAVENOUS | Status: DC | PRN
  Administered 2014-07-04: 50 mg via INTRAVENOUS
  Administered 2014-07-04: 150 mg via INTRAVENOUS

## 2014-07-04 MED ORDER — HYDROMORPHONE HCL 1 MG/ML IJ SOLN
INTRAMUSCULAR | Status: AC
  Filled 2014-07-04: qty 1

## 2014-07-04 MED ORDER — KCL IN DEXTROSE-NACL 20-5-0.45 MEQ/L-%-% IV SOLN
80.0000 mL/h | INTRAVENOUS | Status: DC
  Administered 2014-07-04: 80 mL/h via INTRAVENOUS
  Filled 2014-07-04 (×4): qty 1000

## 2014-07-04 MED ORDER — OXYCODONE-ACETAMINOPHEN 5-325 MG PO TABS
1.0000 | ORAL_TABLET | ORAL | Status: DC | PRN
  Administered 2014-07-05: 2 via ORAL
  Filled 2014-07-04: qty 2

## 2014-07-04 MED ORDER — NEOSTIGMINE METHYLSULFATE 10 MG/10ML IV SOLN
INTRAVENOUS | Status: DC | PRN
  Administered 2014-07-04: 3 mg via INTRAVENOUS

## 2014-07-04 MED ORDER — ONDANSETRON HCL 4 MG/2ML IJ SOLN: 4.0000 mg | INTRAMUSCULAR | Status: DC | PRN

## 2014-07-04 MED ORDER — DEXTROSE 5 % IV SOLN
500.0000 mg | Freq: Four times a day (QID) | INTRAVENOUS | Status: DC | PRN
  Filled 2014-07-04: qty 5

## 2014-07-04 SURGICAL SUPPLY — 48 items
BAG DECANTER FOR FLEXI CONT (MISCELLANEOUS) ×2 IMPLANT
BENZOIN TINCTURE PRP APPL 2/3 (GAUZE/BANDAGES/DRESSINGS) ×2 IMPLANT
BLADE CLIPPER SURG (BLADE) ×2 IMPLANT
BRUSH SCRUB EZ PLAIN DRY (MISCELLANEOUS) ×2 IMPLANT
BUR CUTTER 7.0 ROUND (BURR) ×2 IMPLANT
BUR MATCHSTICK NEURO 3.0 LAGG (BURR) IMPLANT
CANISTER SUCT 3000ML PPV (MISCELLANEOUS) ×2 IMPLANT
CONT SPEC 4OZ CLIKSEAL STRL BL (MISCELLANEOUS) ×2 IMPLANT
DEVICE COFLEX STABLIZATION 8MM (Neuro Prosthesis/Implant) ×2 IMPLANT
DRAPE C-ARM 42X72 X-RAY (DRAPES) ×4 IMPLANT
DRAPE LAPAROTOMY 100X72X124 (DRAPES) ×2 IMPLANT
DRAPE SURG 17X23 STRL (DRAPES) ×4 IMPLANT
DRSG OPSITE POSTOP 4X6 (GAUZE/BANDAGES/DRESSINGS) ×2 IMPLANT
DURAPREP 26ML APPLICATOR (WOUND CARE) ×2 IMPLANT
ELECT REM PT RETURN 9FT ADLT (ELECTROSURGICAL) ×2
ELECTRODE REM PT RTRN 9FT ADLT (ELECTROSURGICAL) ×1 IMPLANT
EVACUATOR 1/8 PVC DRAIN (DRAIN) ×2 IMPLANT
GAUZE SPONGE 4X4 12PLY STRL (GAUZE/BANDAGES/DRESSINGS) IMPLANT
GAUZE SPONGE 4X4 16PLY XRAY LF (GAUZE/BANDAGES/DRESSINGS) IMPLANT
GLOVE BIO SURGEON STRL SZ7.5 (GLOVE) ×2 IMPLANT
GLOVE BIO SURGEON STRL SZ8 (GLOVE) ×2 IMPLANT
GLOVE BIOGEL PI IND STRL 8.5 (GLOVE) ×1 IMPLANT
GLOVE BIOGEL PI INDICATOR 8.5 (GLOVE) ×1
GLOVE ECLIPSE 8.0 STRL XLNG CF (GLOVE) ×2 IMPLANT
GLOVE SURG SS PI 7.5 STRL IVOR (GLOVE) ×6 IMPLANT
GOWN STRL REUS W/ TWL LRG LVL3 (GOWN DISPOSABLE) ×1 IMPLANT
GOWN STRL REUS W/ TWL XL LVL3 (GOWN DISPOSABLE) ×2 IMPLANT
GOWN STRL REUS W/TWL 2XL LVL3 (GOWN DISPOSABLE) IMPLANT
GOWN STRL REUS W/TWL LRG LVL3 (GOWN DISPOSABLE) ×1
GOWN STRL REUS W/TWL XL LVL3 (GOWN DISPOSABLE) ×2
KIT BASIN OR (CUSTOM PROCEDURE TRAY) ×2 IMPLANT
KIT ROOM TURNOVER OR (KITS) ×2 IMPLANT
LIQUID BAND (GAUZE/BANDAGES/DRESSINGS) ×2 IMPLANT
NEEDLE HYPO 22GX1.5 SAFETY (NEEDLE) ×2 IMPLANT
NEEDLE SPNL 22GX3.5 QUINCKE BK (NEEDLE) ×4 IMPLANT
NS IRRIG 1000ML POUR BTL (IV SOLUTION) ×2 IMPLANT
PACK LAMINECTOMY NEURO (CUSTOM PROCEDURE TRAY) ×2 IMPLANT
PAD ARMBOARD 7.5X6 YLW CONV (MISCELLANEOUS) ×6 IMPLANT
PATTIES SURGICAL .75X.75 (GAUZE/BANDAGES/DRESSINGS) IMPLANT
SPONGE SURGIFOAM ABS GEL SZ50 (HEMOSTASIS) ×2 IMPLANT
STRIP CLOSURE SKIN 1/2X4 (GAUZE/BANDAGES/DRESSINGS) ×2 IMPLANT
SUT PROLENE 0 CT 1 30 (SUTURE) ×2 IMPLANT
SUT VIC AB 2-0 OS6 18 (SUTURE) ×6 IMPLANT
SUT VIC AB 3-0 CP2 18 (SUTURE) ×2 IMPLANT
SYR 20ML ECCENTRIC (SYRINGE) ×2 IMPLANT
TOWEL OR 17X24 6PK STRL BLUE (TOWEL DISPOSABLE) ×2 IMPLANT
TOWEL OR 17X26 10 PK STRL BLUE (TOWEL DISPOSABLE) ×2 IMPLANT
WATER STERILE IRR 1000ML POUR (IV SOLUTION) ×2 IMPLANT

## 2014-07-04 NOTE — Anesthesia Preprocedure Evaluation (Signed)
Anesthesia Evaluation  Patient identified by MRN, date of birth, ID band Patient awake    Reviewed: Allergy & Precautions, NPO status , Patient's Chart, lab work & pertinent test results  Airway Mallampati: II  TM Distance: >3 FB Neck ROM: Full    Dental no notable dental hx.    Pulmonary Current Smoker,  breath sounds clear to auscultation  Pulmonary exam normal       Cardiovascular hypertension, Pt. on medications and Pt. on home beta blockers Normal cardiovascular exam+ dysrhythmias Atrial Fibrillation Rhythm:Regular Rate:Normal     Neuro/Psych TIAnegative psych ROS   GI/Hepatic Neg liver ROS, GERD-  Medicated,  Endo/Other  negative endocrine ROS  Renal/GU negative Renal ROS  negative genitourinary   Musculoskeletal negative musculoskeletal ROS (+)   Abdominal   Peds negative pediatric ROS (+)  Hematology negative hematology ROS (+)   Anesthesia Other Findings   Reproductive/Obstetrics negative OB ROS                             Anesthesia Physical Anesthesia Plan  ASA: III  Anesthesia Plan: General   Post-op Pain Management:    Induction: Intravenous  Airway Management Planned: Oral ETT  Additional Equipment:   Intra-op Plan:   Post-operative Plan: Extubation in OR  Informed Consent: I have reviewed the patients History and Physical, chart, labs and discussed the procedure including the risks, benefits and alternatives for the proposed anesthesia with the patient or authorized representative who has indicated his/her understanding and acceptance.   Dental advisory given  Plan Discussed with: CRNA and Surgeon  Anesthesia Plan Comments:         Anesthesia Quick Evaluation

## 2014-07-04 NOTE — Op Note (Signed)
Preop diagnosis: Lateral recess stenosis L4-5 bilateral Postop diagnosis: Same Procedure: Bilateral L4-5 decompressive laminotomies with decompression of L5 nerve roots Intralaminar stabilization with Coflex L4-5 Surgeon: Masaki Rothbauer Asst.: Vertell Limber  After being placed the prone position the patient's back was prepped and draped in the usual sterile fashion. Localizing x-rays taken prior to incision to identify the appropriate level. Midline incision was made above the spinous processes of L4 and L5. Using Bovie cutting current the incision was carried on the spinous processes. Subperiosteal dissection was then carried out bilaterally along the spinous processes and lamina of L4 and L5. Self-retaining tract was placed for exposure and x-ray showed approach the appropriate level. Starting the patient's left side generous laminotomy was performed by removing the inferior one half of the L4 lamina the medial one third of the facet joint and the superior one third of the L5 lamina. We did a bone and ligamentum flavum removed in a piecemeal fashion. L5 nerve root was thoroughly decompressed and followed out its foramen. Similar decompression was then carried out on the right side to relieve the lateral recess stenosis there. At this time inspection was carried out in all directions for any evidence of residual compression and none could be identified. Irrigation was carried out and any bleeding control proper coagulation Gelfoam. The intra-spinous ligament was then removed to make space for the Coflex intralaminar device. We used a trial and felt that an 8 mm device was indicated. We splayed the wings and then impacted the device without difficulty. We then crimped the wings around the spinous processes. Final fluoroscopy in AP lateral direction looked excellent. We irrigated once more and left in drain in the vicinity of the device. We then closed the wound in multiple layers of vital on the muscle fascia subcutaneous  and subcuticular tissues. Dermabond Steri-Strips were placed on the skin. A sterile dressing was then applied and the patient was extubated and taken to recovery in stable condition.

## 2014-07-04 NOTE — Plan of Care (Signed)
Problem: Consults Goal: Diagnosis - Spinal Surgery Outcome: Completed/Met Date Met:  07/04/14 Lumbar Laminectomy (Complex)     

## 2014-07-04 NOTE — Anesthesia Postprocedure Evaluation (Signed)
  Anesthesia Post-op Note  Patient: Kayla Maxwell  Procedure(s) Performed: Procedure(s) (LRB): Laminectomy and Foraminotomy - bilateral - Lumbar Four-Five with coflex  (Bilateral)  Patient Location: PACU  Anesthesia Type: General  Level of Consciousness: awake and alert   Airway and Oxygen Therapy: Patient Spontanous Breathing  Post-op Pain: mild  Post-op Assessment: Post-op Vital signs reviewed, Patient's Cardiovascular Status Stable, Respiratory Function Stable, Patent Airway and No signs of Nausea or vomiting  Last Vitals:  Filed Vitals:   07/04/14 1211  BP: 121/72  Pulse: 70  Temp:   Resp: 21    Post-op Vital Signs: stable   Complications: No apparent anesthesia complications

## 2014-07-04 NOTE — Progress Notes (Signed)
ANTIBIOTIC CONSULT NOTE - INITIAL  Pharmacy Consult for vancomycin  Indication: surgical prophylaxis  Allergies  Allergen Reactions  . Codeine Other (See Comments)    Makes her feel "crazy"  . Penicillins Swelling  . Sulfa Antibiotics Nausea Only and Rash    Patient Measurements: Height: 5\' 5"  (165.1 cm) Weight: 187 lb (84.823 kg) IBW/kg (Calculated) : 57 Adjusted Body Weight:   Vital Signs: Temp: 97.8 F (36.6 C) (06/30 1300) Temp Source: Oral (06/30 0753) BP: 103/69 mmHg (06/30 1300) Pulse Rate: 81 (06/30 1300) Intake/Output from previous day:   Intake/Output from this shift: Total I/O In: 1050 [P.O.:50; I.V.:1000] Out: 100 [Blood:100]  Labs: No results for input(s): WBC, HGB, PLT, LABCREA, CREATININE in the last 72 hours. Estimated Creatinine Clearance: 59.5 mL/min (by C-G formula based on Cr of 0.96). No results for input(s): VANCOTROUGH, VANCOPEAK, VANCORANDOM, GENTTROUGH, GENTPEAK, GENTRANDOM, TOBRATROUGH, TOBRAPEAK, TOBRARND, AMIKACINPEAK, AMIKACINTROU, AMIKACIN in the last 72 hours.   Microbiology: Recent Results (from the past 720 hour(s))  Surgical pcr screen     Status: None   Collection Time: 06/25/14 10:43 AM  Result Value Ref Range Status   MRSA, PCR NEGATIVE NEGATIVE Final   Staphylococcus aureus NEGATIVE NEGATIVE Final    Comment:        The Xpert SA Assay (FDA approved for NASAL specimens in patients over 32 years of age), is one component of a comprehensive surveillance program.  Test performance has been validated by [facility] for patients greater than or equal to 3 year old. It is not intended to diagnose infection nor to guide or monitor treatment.     Medical History: Past Medical History  Diagnosis Date  . Allergy     takes Singulair daily as needed  . Acid reflux     takes Nexium daily  . Hyperlipidemia     takes Pravastatin daily-started taking this 6 months ago  . Constipation     takes Colace daily as needed  .  Headache     sinus   . TIA (transient ischemic attack)     showed up on an MRI but she never knew it  . Chronic back pain     stenosis  . Urinary frequency   . History of blood transfusion   . History of shingles   . Stroke     TIA'S X 2 --- one last yr and one this yr...lost her memory over 4-6 hrs.  . Sleep apnea   . Cancer     uterine cancer with removal  . Dysrhythmia     atrial fibrillation dx 04/2014    Medications:  Anti-infectives    Start     Dose/Rate Route Frequency Ordered Stop   07/04/14 2200  vancomycin (VANCOCIN) IVPB 1000 mg/200 mL premix     1,000 mg 200 mL/hr over 60 Minutes Intravenous Every 12 hours 07/04/14 1321     07/04/14 1001  bacitracin 50,000 Units in sodium chloride irrigation 0.9 % 500 mL irrigation  Status:  Discontinued       As needed 07/04/14 1002 07/04/14 1156   07/04/14 0830  vancomycin (VANCOCIN) IVPB 1000 mg/200 mL premix     1,000 mg 200 mL/hr over 60 Minutes Intravenous To [facility] 07/03/14 1224 07/04/14 1117     Assessment: 69 yof s/p bilateral L4-5 decompressive laminotomies with decompression of L5 nerve roots to continue vancomycin for surgical prophylaxis with drain in place. Pre-op Scr is WNL. Pre-op dose of vancomycin give at 1017 this AM.  Pt is afebrile, pre-op WBC is WNL.   Vanc 6/30>>  Goal of Therapy:  Vancomycin trough level 10-15 mcg/ml  Plan:  - Vancomycin 1gm IV Q12H - F/u renal fxn, C&S, clinical status and trough at [place], Rande Lawman 07/04/2014,1:21 PM

## 2014-07-04 NOTE — Anesthesia Procedure Notes (Signed)
Procedure Name: Intubation Date/Time: 07/04/2014 10:16 AM Performed by: Shirlyn Goltz Pre-anesthesia Checklist: Patient identified, Emergency Drugs available, Suction available and Patient being monitored Patient Re-evaluated:Patient Re-evaluated prior to inductionOxygen Delivery Method: Circle system utilized Preoxygenation: Pre-oxygenation with 100% oxygen Intubation Type: IV induction Ventilation: Mask ventilation without difficulty and Oral airway inserted - appropriate to patient size Laryngoscope Size: Glidescope (small #3 elective) Grade View: Grade I Tube type: Oral Tube size: 7.0 mm Number of attempts: 1 Airway Equipment and Method: Stylet and Video-laryngoscopy Placement Confirmation: ETT inserted through vocal cords under direct vision,  positive ETCO2 and breath sounds checked- equal and bilateral Secured at: 22 cm Tube secured with: Tape Dental Injury: Teeth and Oropharynx as per pre-operative assessment  Difficulty Due To: Difficulty was anticipated and Difficult Airway- due to anterior larynx

## 2014-07-04 NOTE — Transfer of Care (Signed)
Immediate Anesthesia Transfer of Care Note  Patient: Kayla Maxwell  Procedure(s) Performed: Procedure(s): Laminectomy and Foraminotomy - bilateral - Lumbar Four-Five with coflex  (Bilateral)  Patient Location: PACU  Anesthesia Type:General  Level of Consciousness: awake, alert , oriented and patient cooperative  Airway & Oxygen Therapy: Patient Spontanous Breathing and Patient connected to face mask oxygen  Post-op Assessment: Report given to RN, Post -op Vital signs reviewed and stable, Patient moving all extremities and Patient moving all extremities X 4  Post vital signs: Reviewed and stable  Last Vitals:  Filed Vitals:   07/04/14 0759  BP: 151/87  Pulse:   Temp:   Resp:     Complications: No apparent anesthesia complications

## 2014-07-04 NOTE — H&P (Signed)
Kayla Maxwell is an 69 y.o. female.   Chief Complaint: Back and bilateral leg pain HPI: The patient is a 70 year old female who is evaluated in the office for back and bilateral leg pain worse on the left than the right. She was tried a great deal conservative therapy without improvement. She underwent imaging studies which showed lateral recess stenosis at L4-5 bilaterally. Because of her lack of improvement with conservative therapy the patient requested surgery. She now comes for bilateral decompression at L4-5 with Coflex device. I've had a long discussion with her regarding the risks and benefits of surgical intervention. The risks discussed include but are not limited to bleeding infection weakness numbness paralysis spinal fluid leak trouble with instrumentation coma and death. We have discussed alternative methods of therapy along with risks and benefits of nonintervention. She's had the opportunity to ask numerous questions and appears to understand. With this information in hand she has requested we proceed with surgery.  Past Medical History  Diagnosis Date  . Allergy     takes Singulair daily as needed  . Acid reflux     takes Nexium daily  . Hyperlipidemia     takes Pravastatin daily-started taking this 6 months ago  . Constipation     takes Colace daily as needed  . Headache     sinus   . TIA (transient ischemic attack)     showed up on an MRI but she never knew it  . Chronic back pain     stenosis  . Urinary frequency   . History of blood transfusion   . History of shingles   . Stroke     TIA'S X 2 --- one last yr and one this yr...lost her memory over 4-6 hrs.  . Sleep apnea   . Cancer     uterine cancer with removal  . Dysrhythmia     atrial fibrillation dx 04/2014    Past Surgical History  Procedure Laterality Date  . Cesarean section  1968  . Appendectomy  1963  . Cholecystectomy  2014  . Tonsillectomy    . Breast surgery  1984    augementation  .  Abdominal hysterectomy    . Colonoscopy    . Esophagogastroduodenoscopy      History reviewed. No pertinent family history. Social History:  reports that she has been smoking.  She has never used smokeless tobacco. She reports that she does not drink alcohol or use illicit drugs.  Allergies:  Allergies  Allergen Reactions  . Codeine Other (See Comments)    Makes her feel "crazy"  . Penicillins Swelling  . Sulfa Antibiotics Nausea Only and Rash    Medications Prior to Admission  Medication Sig Dispense Refill  . acetaminophen (TYLENOL) 500 MG tablet Take 1,500 mg by mouth every 8 (eight) hours as needed for mild pain.    . metoprolol tartrate (LOPRESSOR) 25 MG tablet Take 25 mg by mouth 2 (two) times daily.    Marland Kitchen NEXIUM 40 MG capsule Take 1 capsule by mouth daily.    . pravastatin (PRAVACHOL) 40 MG tablet take 1 tablet by mouth at bedtime 30 tablet 5  . rivaroxaban (XARELTO) 20 MG TABS tablet Take 20 mg by mouth daily with supper.    . montelukast (SINGULAIR) 10 MG tablet Take 1 tablet by mouth daily as needed.     . Multiple Vitamin (MULTIVITAMIN) capsule Take 1 capsule by mouth daily.       Results for orders placed or  performed during the hospital encounter of 07/04/14 (from the past 48 hour(s))  PT- INR Day of Surgery     Status: None   Collection Time: 07/04/14  8:16 AM  Result Value Ref Range   Prothrombin Time 13.2 11.6 - 15.2 seconds   INR 0.98 0.00 - 1.49   No results found.  Primarily positive for history of atrial fibrillation  Blood pressure 151/87, pulse 96, temperature 98.2 F (36.8 C), temperature source Oral, resp. rate 20, height 5\' 5"  (1.651 m), weight 84.823 kg (187 lb), SpO2 96 %.  The patient is awake alert and oriented. She is no facial asymmetry. Her gait is mildly antalgic. She has mild weakness of extensor pollicis longus on the left. Assessment/Plan Impression is that of lateral recess stenosis bilaterally at L4-5. The plan is for bilateral  decompressive laminotomies with Coflex intralaminar device.  Faythe Ghee, MD 07/04/2014, 10:05 AM

## 2014-07-05 ENCOUNTER — Encounter (HOSPITAL_COMMUNITY): Payer: Self-pay | Admitting: Neurosurgery

## 2014-07-05 NOTE — Progress Notes (Signed)
Patient alert and oriented, mae's well, voiding adequate amount of urine, swallowing without difficulty, no c/o pain. Patient discharged home with family. Script and discharged instructions given to patient. Patient and family stated understanding of d/c instructions given and has an appointment with MD. 

## 2014-07-05 NOTE — Progress Notes (Signed)
Subjective: Patient reports "I walked last night and didn't have any headache. It just came all of a sudden when I stood beside the bed this morning"  Objective: Vital signs in last 24 hours: Temp:  [97.4 F (36.3 C)-98.5 F (36.9 C)] 98.4 F (36.9 C) (07/01 0609) Pulse Rate:  [69-110] 110 (07/01 0609) Resp:  [16-22] 20 (07/01 0609) BP: (103-195)/(61-96) 195/96 mmHg (07/01 0609) SpO2:  [96 %-100 %] 97 % (07/01 0609)  Intake/Output from previous day: 06/30 0701 - 07/01 0700 In: 1290 [P.O.:290; I.V.:1000] Out: 320 [Drains:220; Blood:100] Intake/Output this shift:    Alert, conversant, supine in bed. Reports no headache at present. Strength is full BLE. Left lumbar & thigh pain persists - reassured re: typical post-op pain. Inspection of incision reveals disconnected Hemovac drain (open) and thin, blood tinged drainage on chux. Removal of drain ellicits no drainage from drain site. Incision remains well-covered with steri's beneath honeycomb. No swelling or drainage with standing. No recurrent headache with standing or walking. Some left trapezius pain ?sleep positioning. Pt recalls not eating supper and notes that may have contributed to her headache earlier.   Lab Results: No results for input(s): WBC, HGB, HCT, PLT in the last 72 hours. BMET No results for input(s): NA, K, CL, CO2, GLUCOSE, BUN, CREATININE, CALCIUM in the last 72 hours.  Studies/Results: Dg Lumbar Spine 2-3 Views  07/04/2014   CLINICAL DATA:  Lumbar fusion L4-L5  EXAM: DG C-ARM 61-120 MIN; LUMBAR SPINE - 2-3 VIEW  COMPARISON:  CT lumbar myelogram 03/01/2014  FLUOROSCOPY TIME:  0 minutes 15 seconds  FINDINGS: AP and lateral views of the lumbar spine obtained by C-arm fluoroscopy intraoperatively.  Images demonstrate placement of an interspinous fusion device between the spinous processes of L4 and L5.  Bones appear demineralized.  Vertebral body and disc space heights maintained.  IMPRESSION: Posterior spinous fusion of  L4-L5.   Electronically Signed   By: Lavonia Dana M.D.   On: 07/04/2014 12:17   Dg C-arm 1-60 Min  07/04/2014   CLINICAL DATA:  Lumbar fusion L4-L5  EXAM: DG C-ARM 61-120 MIN; LUMBAR SPINE - 2-3 VIEW  COMPARISON:  CT lumbar myelogram 03/01/2014  FLUOROSCOPY TIME:  0 minutes 15 seconds  FINDINGS: AP and lateral views of the lumbar spine obtained by C-arm fluoroscopy intraoperatively.  Images demonstrate placement of an interspinous fusion device between the spinous processes of L4 and L5.  Bones appear demineralized.  Vertebral body and disc space heights maintained.  IMPRESSION: Posterior spinous fusion of L4-L5.   Electronically Signed   By: Lavonia Dana M.D.   On: 07/04/2014 12:17    Assessment/Plan:   LOS: 1 day  She will stay up in chair and eat breakfast, monitoring for return of headache. If headache does not return, plan will be to d/c to home.  If headache returns with upright position, plan will be to implement flat bedrest and monitor incision/drain site for drainage/CSF.  Pt verbalizes understanding of plan.    Verdis Prime 07/05/2014, 8:07 AM

## 2014-07-05 NOTE — Discharge Instructions (Signed)
Wound Care Keep incision covered and dry for one week. You may wash up. You may remove outer bandage after one week.  Do not put any creams, lotions, or ointments on incision.  Activity Walk each and every day, increasing distance each day. No lifting greater than 5 lbs.  Avoid bending, arching, and twisting. No driving or riding in car until further notice at follow up appointment.  Diet Resume your normal diet.  Return to Work Will be discussed at you follow up appointment. Call Your Doctor If Any of These Occur Redness, drainage, or swelling at the wound.  Temperature greater than 101 degrees. Severe pain not relieved by pain medication. Incision starts to come apart. Follow Up Appt Call  [phone]) for problems.  If you have any hardware placed in your spine, you will need an x-ray before your appointment.

## 2014-07-05 NOTE — Discharge Summary (Signed)
Physician Discharge Summary  Patient ID: Kayla Maxwell MRN: [id number] DOB/AGE: 08-Dec-1945 69 y.o.  Admit date: 07/04/2014 Discharge date: 07/05/2014  Admission Diagnoses: Lateral recess stenosis L4-5 bilateral   Discharge Diagnoses: Lateral recess stenosis L4-5 bilateral s/p Bilateral L4-5 decompressive laminotomies with decompression of L5 nerve roots Intralaminar stabilization with Coflex L4-5   Active Problems:   Spinal stenosis, lumbar   Discharged Condition: good  Hospital Course: [name] was admitted for surgery by Dr. Hal Neer with dx lateral recess stenosis. Following uncomplicated Bilateral T2-6 decompressive laminotomies with decompression of L5 nerve roots and Intralaminar stabilization with Coflex L4-5, she recovered well and transferred to 3500 for observation. One episode of severe headache upon standing prompted additional observation time.  Discharged after observation revealed no recurrent headache, no incisional or drain site swelling, and resolution of all pain complaints.  Consults: None  Significant Diagnostic Studies:   Treatments: surgery: Bilateral L4-5 decompressive laminotomies with decompression of L5 nerve roots Intralaminar stabilization with Coflex L4-5    Discharge Exam: Blood pressure 114/58, pulse 67, temperature 98.2 F (36.8 C), temperature source Oral, resp. rate 18, height 5\' 5"  (1.651 m), weight 84.823 kg (187 lb), SpO2 99 %. Standing, changing clothes, without c/o pain or discomfort. Denies any recurrence of headache since one episode early am. Drain site with small amount bloody drainage, no evidence of csf. Site without erythema or drainage. Drain site drsg changed by nursing.  Incision drsg intact, dry.  Strength full BLE. Ambulated in hallway without difficulty. Appetite good.    Disposition: 01-Home or Self Care Pt verbalizes understanding of d/c instructions. She already has f/u appt with Dr. Hal Neer. Rx's to chart for Norco 5/325  1PO q4hrs prn pain #60 & Tizanidine 2mg  1 po TID prn spasm #60.     Medication List    ASK your doctor about these medications        acetaminophen 500 MG tablet  Commonly known as:  TYLENOL  Take 1,500 mg by mouth every 8 (eight) hours as needed for mild pain.     metoprolol tartrate 25 MG tablet  Commonly known as:  LOPRESSOR  Take 25 mg by mouth 2 (two) times daily.     montelukast 10 MG tablet  Commonly known as:  SINGULAIR  Take 1 tablet by mouth daily as needed.     multivitamin capsule  Take 1 capsule by mouth daily.     NEXIUM 40 MG capsule  Generic drug:  esomeprazole  Take 1 capsule by mouth daily.     pravastatin 40 MG tablet  Commonly known as:  PRAVACHOL  take 1 tablet by mouth at bedtime     rivaroxaban 20 MG Tabs tablet  Commonly known as:  XARELTO  Take 20 mg by mouth daily with supper.         Signed: Verdis Prime 07/05/2014, 2:00 PM

## 2014-07-05 NOTE — Progress Notes (Signed)
Patient with severe headache this morning and pressure behind her eyes. BP 195/96, HR 110, O2 97%. MD paged. Pain meds given this am. Patient's hemovac removed with minor leakage. Order to lay patient flat this am. Will continue to monitor. Adib Wahba, Rande Brunt, RN

## 2014-07-05 NOTE — Care Management (Signed)
Utilization review completed. Samyra Limb, RN Case Manager [phone]. 

## 2014-07-05 NOTE — Progress Notes (Signed)
Patient ID: Kayla Maxwell, female   DOB: 1945-03-18, 69 y.o.   MRN: [id number] Standing, changing clothes, without c/o pain or discomfort. Denies any recurrence of headache since one episode early am. Drain site with small amount bloody drainage, no evidence of csf. Site without erythema or drainage. Drain site drsg changed by nursing.  Incision drsg intact, dry.  Strength full BLE. Ambulated in hallway without difficulty. Appetite good. Pt verbalizes understanding of d/c instructions. She already has f/u appt with Dr. Hal Neer. Rx's to chart for Norco 5/325 1PO q4hrs prn pain #60 & Tizanidine 2mg  1 po TID prn spasm #60.  Verdis Prime RN BSN

## 2014-07-23 ENCOUNTER — Telehealth: Payer: Self-pay | Admitting: Family Medicine

## 2014-07-23 NOTE — Telephone Encounter (Signed)
Talked with patient. Does have headaches now.  May have cut the nerve and then said he did not. Not getting better. Is getting headaches. Concerned about spinal fluid leaking.  Not getting strength back.  Not seeing therapist.  Take pain medication as needed.  Thanks.

## 2014-07-23 NOTE — Telephone Encounter (Signed)
Pt states she had back surgery 07/04/14 and has had some issues.  Pt is requesting a call back from Dr Venia Minks if possible.  YO#[phone]/SF

## 2014-07-29 ENCOUNTER — Other Ambulatory Visit: Payer: Self-pay | Admitting: Neurosurgery

## 2014-07-29 DIAGNOSIS — M5416 Radiculopathy, lumbar region: Secondary | ICD-10-CM

## 2014-07-29 DIAGNOSIS — R519 Headache, unspecified: Secondary | ICD-10-CM

## 2014-07-29 DIAGNOSIS — R51 Headache: Principal | ICD-10-CM

## 2014-07-29 DIAGNOSIS — R42 Dizziness and giddiness: Secondary | ICD-10-CM

## 2014-07-29 DIAGNOSIS — G4489 Other headache syndrome: Secondary | ICD-10-CM

## 2014-07-30 ENCOUNTER — Ambulatory Visit
Admission: RE | Admit: 2014-07-30 | Discharge: 2014-07-30 | Disposition: A | Discharge status: Home / Self Care | Payer: Medicare Other | Source: Ambulatory Visit | Attending: Neurosurgery | Admitting: Neurosurgery

## 2014-07-30 DIAGNOSIS — G4489 Other headache syndrome: Secondary | ICD-10-CM

## 2014-07-30 DIAGNOSIS — M5416 Radiculopathy, lumbar region: Secondary | ICD-10-CM

## 2014-08-05 ENCOUNTER — Ambulatory Visit
Admission: RE | Admit: 2014-08-05 | Discharge: 2014-08-05 | Disposition: A | Discharge status: Home / Self Care | Payer: Medicare Other | Source: Ambulatory Visit | Attending: Neurosurgery | Admitting: Neurosurgery

## 2014-08-05 ENCOUNTER — Ambulatory Visit: Payer: Medicare Other

## 2014-08-05 DIAGNOSIS — Z9889 Other specified postprocedural states: Secondary | ICD-10-CM | POA: Insufficient documentation

## 2014-08-05 DIAGNOSIS — M5416 Radiculopathy, lumbar region: Secondary | ICD-10-CM | POA: Diagnosis present

## 2014-08-05 DIAGNOSIS — M4807 Spinal stenosis, lumbosacral region: Secondary | ICD-10-CM | POA: Insufficient documentation

## 2014-08-05 DIAGNOSIS — M5386 Other specified dorsopathies, lumbar region: Secondary | ICD-10-CM | POA: Insufficient documentation

## 2014-08-05 MED ORDER — GADOBENATE DIMEGLUMINE 529 MG/ML IV SOLN
20.0000 mL | Freq: Once | INTRAVENOUS | Status: AC | PRN
  Administered 2014-08-05: 17 mL via INTRAVENOUS

## 2014-08-12 ENCOUNTER — Telehealth: Payer: Self-pay | Admitting: Family Medicine

## 2014-08-12 DIAGNOSIS — M5136 Other intervertebral disc degeneration, lumbar region: Secondary | ICD-10-CM

## 2014-08-12 DIAGNOSIS — M48061 Spinal stenosis, lumbar region without neurogenic claudication: Secondary | ICD-10-CM

## 2014-08-12 NOTE — Telephone Encounter (Signed)
Pt is requesting a referral to see Dr. Glenna Fellows at [facility] in [place], [place] for a second opinion. Pt had back surgery on June 30th, and her sx are worsening. She is Kayla Maxwell experiencing swelling, weakness, no appetite. Pt reports Dr. Hal Neer is "no help at all". Renaldo Fiddler, CMA

## 2014-08-12 NOTE — Telephone Encounter (Signed)
Pt called asking for Dr. Venia Minks to call her back regarding her back surgery.   Thanks Con Memos

## 2014-08-13 ENCOUNTER — Telehealth: Payer: Self-pay | Admitting: Family Medicine

## 2014-08-13 NOTE — Telephone Encounter (Signed)
Pat. Has called back wanting to know about a referral.  Please call her back.

## 2014-08-13 NOTE — Telephone Encounter (Signed)
Pt request for Kayla Maxwell to call her back. Thanks TNP

## 2014-08-14 ENCOUNTER — Telehealth: Payer: Self-pay | Admitting: Family Medicine

## 2014-08-14 NOTE — Telephone Encounter (Signed)
Spoke with Dr. Carloyn Manner.   He can not see patient. Felt patient should up with Dr Hal Neer. Left message to call back if needed. Thanks.

## 2014-08-14 NOTE — Telephone Encounter (Signed)
Advised pt referral has been made. Renaldo Fiddler, CMA

## 2014-08-16 ENCOUNTER — Encounter (HOSPITAL_COMMUNITY): Payer: Self-pay | Admitting: *Deleted

## 2014-08-16 ENCOUNTER — Telehealth: Payer: Self-pay | Admitting: Family Medicine

## 2014-08-16 ENCOUNTER — Other Ambulatory Visit: Payer: Self-pay | Admitting: Neurosurgery

## 2014-08-16 NOTE — Telephone Encounter (Signed)
Only MD in office today. Really will not have time to call her back. Please triage or see if wants follow up ov.  Thanks.

## 2014-08-16 NOTE — Progress Notes (Signed)
Anesthesia Chart Review: SAME DAY WORK-UP.  Patient is s/p L4-5 laminectomy and foraminotomy on 07/04/14 by Dr. Hal Neer.  She has developed a lumbar wound and needs exploration which is scheduled for 08/19/14.   Chart was previously reviewed by Willeen Cass, FNP-BC on 06/25/14. See her note for additional details. Patient had been cleared by cardiologist Dr. Nehemiah Massed at that time with permission to hold Xarelto for three days prior to surgery. Patient is on this for history of afib. She has stopped Xarelto in anticipation for this surgery.   EKG 04/25/2014: Atrial fibrillation.ST & T wave abnormality, consider inferolateral ischemia. Prolonged QT  Echo 05/08/2014 (Care Everywhere): -Normal LV systolic function with mild LVH -Normal RV systolic function -Moderate mitral and tricuspid regurgitation -no valvular stenosis  She is for labs and anesthesiologist evaluation on the day of surgery.  George Hugh [facility] Short Stay Center/Anesthesiology Phone [phone] 08/16/2014 12:14 PM

## 2014-08-16 NOTE — Telephone Encounter (Signed)
Pt just wanted to talk to you about having to go back for surgery on Monday. Dr. Hal Neer will be "repairing the nerve he knicked". Renaldo Fiddler, CMA

## 2014-08-16 NOTE — Progress Notes (Signed)
Pt denies SOB and chest pain. Pt stated " I took my last dose of Xarelto 2 weeks ago." Pt made aware to stop taking Aspirin, NSAIDS, otc vitamins and herbal medications. Pt verbalized understanding of all pre-op instructions.

## 2014-08-16 NOTE — Telephone Encounter (Signed)
I spoke with Ms. Eckardt;  She says she already got the message that Dr. Carloyn Manner would not except the referral.  She would [place] like to speak with you about a few other issues that she would not share with me.   Thanks,   -Mickel Baas

## 2014-08-16 NOTE — Telephone Encounter (Signed)
Talked with patient.  Leaking CSF. Needs to go for follow up surgery, scheduled for Monday. Will call with results.  Thanks.

## 2014-08-16 NOTE — Telephone Encounter (Signed)
Pt stated she missed Dr. Sharyon Medicus call and was calling her back. Thanks TNP

## 2014-08-19 ENCOUNTER — Ambulatory Visit (HOSPITAL_COMMUNITY): Payer: Medicare Other | Admitting: Vascular Surgery

## 2014-08-19 ENCOUNTER — Encounter (HOSPITAL_COMMUNITY)
Admission: RE | Disposition: A | Discharge status: Home / Self Care | Payer: Self-pay | Source: Ambulatory Visit | Attending: Neurosurgery

## 2014-08-19 ENCOUNTER — Inpatient Hospital Stay (HOSPITAL_COMMUNITY)
Admission: RE | Admit: 2014-08-19 | Discharge: 2014-08-29 | DRG: 029 | Disposition: A | Discharge status: Home / Self Care | Payer: Medicare Other | Source: Ambulatory Visit | Attending: Neurosurgery | Admitting: Neurosurgery

## 2014-08-19 ENCOUNTER — Encounter (HOSPITAL_COMMUNITY): Payer: Self-pay | Admitting: *Deleted

## 2014-08-19 DIAGNOSIS — F17210 Nicotine dependence, cigarettes, uncomplicated: Secondary | ICD-10-CM | POA: Diagnosis present

## 2014-08-19 DIAGNOSIS — Z8542 Personal history of malignant neoplasm of other parts of uterus: Secondary | ICD-10-CM | POA: Diagnosis not present

## 2014-08-19 DIAGNOSIS — Z880 Allergy status to penicillin: Secondary | ICD-10-CM | POA: Diagnosis not present

## 2014-08-19 DIAGNOSIS — Z8673 Personal history of transient ischemic attack (TIA), and cerebral infarction without residual deficits: Secondary | ICD-10-CM

## 2014-08-19 DIAGNOSIS — Z79891 Long term (current) use of opiate analgesic: Secondary | ICD-10-CM | POA: Diagnosis not present

## 2014-08-19 DIAGNOSIS — G9600 Cerebrospinal fluid leak, unspecified: Secondary | ICD-10-CM | POA: Diagnosis present

## 2014-08-19 DIAGNOSIS — Z90710 Acquired absence of both cervix and uterus: Secondary | ICD-10-CM

## 2014-08-19 DIAGNOSIS — K5900 Constipation, unspecified: Secondary | ICD-10-CM | POA: Diagnosis present

## 2014-08-19 DIAGNOSIS — K219 Gastro-esophageal reflux disease without esophagitis: Secondary | ICD-10-CM | POA: Diagnosis present

## 2014-08-19 DIAGNOSIS — E785 Hyperlipidemia, unspecified: Secondary | ICD-10-CM | POA: Diagnosis present

## 2014-08-19 DIAGNOSIS — Z79899 Other long term (current) drug therapy: Secondary | ICD-10-CM | POA: Diagnosis not present

## 2014-08-19 DIAGNOSIS — Y838 Other surgical procedures as the cause of abnormal reaction of the patient, or of later complication, without mention of misadventure at the time of the procedure: Secondary | ICD-10-CM | POA: Diagnosis present

## 2014-08-19 DIAGNOSIS — K567 Ileus, unspecified: Secondary | ICD-10-CM | POA: Diagnosis not present

## 2014-08-19 DIAGNOSIS — Z882 Allergy status to sulfonamides status: Secondary | ICD-10-CM | POA: Diagnosis not present

## 2014-08-19 DIAGNOSIS — G9782 Other postprocedural complications and disorders of nervous system: Principal | ICD-10-CM | POA: Diagnosis present

## 2014-08-19 DIAGNOSIS — Z885 Allergy status to narcotic agent status: Secondary | ICD-10-CM

## 2014-08-19 DIAGNOSIS — R51 Headache: Secondary | ICD-10-CM | POA: Diagnosis present

## 2014-08-19 DIAGNOSIS — Z09 Encounter for follow-up examination after completed treatment for conditions other than malignant neoplasm: Secondary | ICD-10-CM

## 2014-08-19 HISTORY — DX: Other complications of procedures, not elsewhere classified, initial encounter: T81.89XA

## 2014-08-19 HISTORY — PX: LUMBAR WOUND DEBRIDEMENT: SHX1988

## 2014-08-19 HISTORY — PX: BACK SURGERY: SHX140

## 2014-08-19 LAB — CBC
HCT: 50.2 % — ABNORMAL HIGH (ref 36.0–46.0)
Hemoglobin: 16.9 g/dL — ABNORMAL HIGH (ref 12.0–15.0)
MCH: 31.6 pg (ref 26.0–34.0)
MCHC: 33.7 g/dL (ref 30.0–36.0)
MCV: 93.8 fL (ref 78.0–100.0)
Platelets: 234 10*3/uL (ref 150–400)
RBC: 5.35 MIL/uL — ABNORMAL HIGH (ref 3.87–5.11)
RDW: 13.1 % (ref 11.5–15.5)
WBC: 8.7 10*3/uL (ref 4.0–10.5)

## 2014-08-19 LAB — BASIC METABOLIC PANEL
Anion gap: 7 (ref 5–15)
BUN: 13 mg/dL (ref 6–20)
CO2: 29 mmol/L (ref 22–32)
Calcium: 9.5 mg/dL (ref 8.9–10.3)
Chloride: 103 mmol/L (ref 101–111)
Creatinine, Ser: 1.04 mg/dL — ABNORMAL HIGH (ref 0.44–1.00)
GFR calc Af Amer: >60 mL/min (ref >60)
GFR calc non Af Amer: 54 mL/min — ABNORMAL LOW (ref >60)
Glucose, Bld: 114 mg/dL — ABNORMAL HIGH (ref 65–99)
Potassium: 4.7 mmol/L (ref 3.5–5.1)
Sodium: 139 mmol/L (ref 135–145)

## 2014-08-19 SURGERY — LUMBAR WOUND DEBRIDEMENT
Anesthesia: General | Site: Spine Lumbar

## 2014-08-19 MED ORDER — HEMOSTATIC AGENTS (NO CHARGE) OPTIME
TOPICAL | Status: DC | PRN
  Administered 2014-08-19: 1 via TOPICAL

## 2014-08-19 MED ORDER — FENTANYL CITRATE (PF) 100 MCG/2ML IJ SOLN
INTRAMUSCULAR | Status: DC | PRN
  Administered 2014-08-19 (×5): 50 ug via INTRAVENOUS

## 2014-08-19 MED ORDER — SODIUM CHLORIDE 0.9 % IV SOLN
10.0000 mg | INTRAVENOUS | Status: DC | PRN
  Administered 2014-08-19: 10 ug/min via INTRAVENOUS

## 2014-08-19 MED ORDER — LACTATED RINGERS IV SOLN
INTRAVENOUS | Status: DC | PRN
  Administered 2014-08-19 (×2): via INTRAVENOUS

## 2014-08-19 MED ORDER — BISACODYL 5 MG PO TBEC
5.0000 mg | DELAYED_RELEASE_TABLET | Freq: Every day | ORAL | Status: DC | PRN
  Administered 2014-08-21 – 2014-08-24 (×3): 5 mg via ORAL
  Filled 2014-08-19 (×3): qty 1

## 2014-08-19 MED ORDER — ONDANSETRON HCL 4 MG/2ML IJ SOLN
4.0000 mg | INTRAMUSCULAR | Status: DC | PRN
  Administered 2014-08-20 – 2014-08-25 (×8): 4 mg via INTRAVENOUS
  Filled 2014-08-19 (×7): qty 2

## 2014-08-19 MED ORDER — ARTIFICIAL TEARS OP OINT
TOPICAL_OINTMENT | OPHTHALMIC | Status: DC | PRN
  Administered 2014-08-19: 1 via OPHTHALMIC

## 2014-08-19 MED ORDER — FENTANYL CITRATE (PF) 250 MCG/5ML IJ SOLN
INTRAMUSCULAR | Status: AC
  Filled 2014-08-19: qty 5

## 2014-08-19 MED ORDER — MIDAZOLAM HCL 2 MG/2ML IJ SOLN
INTRAMUSCULAR | Status: AC
  Filled 2014-08-19: qty 4

## 2014-08-19 MED ORDER — SODIUM CHLORIDE 0.9 % IR SOLN
Status: DC | PRN
  Administered 2014-08-19: 500 mL

## 2014-08-19 MED ORDER — SODIUM CHLORIDE 0.9 % IJ SOLN
3.0000 mL | Freq: Two times a day (BID) | INTRAMUSCULAR | Status: DC
  Administered 2014-08-19: 10 mL via INTRAVENOUS
  Administered 2014-08-20 – 2014-08-29 (×6): 3 mL via INTRAVENOUS

## 2014-08-19 MED ORDER — PROPOFOL 10 MG/ML IV BOLUS
INTRAVENOUS | Status: AC
  Filled 2014-08-19: qty 20

## 2014-08-19 MED ORDER — NEOSTIGMINE METHYLSULFATE 10 MG/10ML IV SOLN
INTRAVENOUS | Status: DC | PRN
  Administered 2014-08-19: 5 mg via INTRAVENOUS

## 2014-08-19 MED ORDER — PROPOFOL 10 MG/ML IV BOLUS
INTRAVENOUS | Status: DC | PRN
  Administered 2014-08-19 (×2): 50 mg via INTRAVENOUS

## 2014-08-19 MED ORDER — BUPIVACAINE HCL (PF) 0.5 % IJ SOLN
INTRAMUSCULAR | Status: DC | PRN
  Administered 2014-08-19: 20 mL

## 2014-08-19 MED ORDER — ACETAMINOPHEN 650 MG RE SUPP: 650.0000 mg | RECTAL | Status: DC | PRN

## 2014-08-19 MED ORDER — SODIUM CHLORIDE 0.9 % IJ SOLN: 3.0000 mL | INTRAMUSCULAR | Status: DC | PRN

## 2014-08-19 MED ORDER — MIDAZOLAM HCL 5 MG/5ML IJ SOLN
INTRAMUSCULAR | Status: DC | PRN
  Administered 2014-08-19: 2 mg via INTRAVENOUS

## 2014-08-19 MED ORDER — FENTANYL CITRATE (PF) 100 MCG/2ML IJ SOLN
25.0000 ug | INTRAMUSCULAR | Status: DC | PRN
  Administered 2014-08-25: 50 ug via INTRAVENOUS
  Filled 2014-08-19: qty 2

## 2014-08-19 MED ORDER — MONTELUKAST SODIUM 10 MG PO TABS: 10.0000 mg | ORAL_TABLET | Freq: Every day | ORAL | Status: DC | PRN

## 2014-08-19 MED ORDER — PHENOL 1.4 % MT LIQD
1.0000 | OROMUCOSAL | Status: DC | PRN
  Administered 2014-08-24: 1 via OROMUCOSAL
  Filled 2014-08-19: qty 177

## 2014-08-19 MED ORDER — HYDROCODONE-ACETAMINOPHEN 5-325 MG PO TABS
1.0000 | ORAL_TABLET | ORAL | Status: DC | PRN
  Administered 2014-08-19 – 2014-08-21 (×4): 2 via ORAL
  Filled 2014-08-19 (×4): qty 2

## 2014-08-19 MED ORDER — ONDANSETRON HCL 4 MG/2ML IJ SOLN
INTRAMUSCULAR | Status: DC | PRN
  Administered 2014-08-19 (×2): 4 mg via INTRAVENOUS

## 2014-08-19 MED ORDER — ACETAMINOPHEN 325 MG PO TABS
650.0000 mg | ORAL_TABLET | ORAL | Status: DC | PRN
  Administered 2014-08-21 – 2014-08-22 (×2): 650 mg via ORAL
  Filled 2014-08-19 (×3): qty 2

## 2014-08-19 MED ORDER — MENTHOL 3 MG MT LOZG
1.0000 | LOZENGE | OROMUCOSAL | Status: DC | PRN
Start: 2014-08-19 — End: 2014-08-29
  Administered 2014-08-27: 3 mg via ORAL
  Filled 2014-08-19: qty 9

## 2014-08-19 MED ORDER — SODIUM CHLORIDE 0.9 % IV SOLN: 250.0000 mL | INTRAVENOUS | Status: DC

## 2014-08-19 MED ORDER — 0.9 % SODIUM CHLORIDE (POUR BTL) OPTIME
TOPICAL | Status: DC | PRN
  Administered 2014-08-19: 1000 mL

## 2014-08-19 MED ORDER — VANCOMYCIN HCL 10 G IV SOLR
1250.0000 mg | Freq: Once | INTRAVENOUS | Status: AC
  Administered 2014-08-19: 1250 mg via INTRAVENOUS
  Filled 2014-08-19: qty 1250

## 2014-08-19 MED ORDER — METHOCARBAMOL 500 MG PO TABS
500.0000 mg | ORAL_TABLET | Freq: Four times a day (QID) | ORAL | Status: DC | PRN
  Administered 2014-08-19 – 2014-08-22 (×2): 500 mg via ORAL
  Filled 2014-08-19 (×3): qty 1

## 2014-08-19 MED ORDER — DEXAMETHASONE SODIUM PHOSPHATE 4 MG/ML IJ SOLN
INTRAMUSCULAR | Status: DC | PRN
  Administered 2014-08-19: 4 mg via INTRAVENOUS

## 2014-08-19 MED ORDER — METOPROLOL TARTRATE 12.5 MG HALF TABLET
ORAL_TABLET | ORAL | Status: AC
  Administered 2014-08-19: 25 mg
  Filled 2014-08-19: qty 2

## 2014-08-19 MED ORDER — LIDOCAINE HCL (CARDIAC) 20 MG/ML IV SOLN
INTRAVENOUS | Status: DC | PRN
  Administered 2014-08-19: 100 mg via INTRAVENOUS

## 2014-08-19 MED ORDER — PHENYLEPHRINE HCL 10 MG/ML IJ SOLN
INTRAMUSCULAR | Status: DC | PRN
  Administered 2014-08-19: 80 ug via INTRAVENOUS

## 2014-08-19 MED ORDER — PANTOPRAZOLE SODIUM 40 MG IV SOLR
40.0000 mg | Freq: Every day | INTRAVENOUS | Status: DC
  Administered 2014-08-19 – 2014-08-20 (×2): 40 mg via INTRAVENOUS
  Filled 2014-08-19 (×2): qty 40

## 2014-08-19 MED ORDER — PRAVASTATIN SODIUM 40 MG PO TABS
40.0000 mg | ORAL_TABLET | Freq: Every day | ORAL | Status: DC
  Administered 2014-08-19 – 2014-08-28 (×7): 40 mg via ORAL
  Filled 2014-08-19 (×5): qty 1
  Filled 2014-08-19: qty 2
  Filled 2014-08-19 (×3): qty 1

## 2014-08-19 MED ORDER — DEXTROSE 5 % IV SOLN
INTRAVENOUS | Status: DC | PRN
  Administered 2014-08-19: 08:00:00 via INTRAVENOUS

## 2014-08-19 MED ORDER — ONDANSETRON HCL 4 MG/2ML IJ SOLN
4.0000 mg | Freq: Once | INTRAMUSCULAR | Status: DC | PRN
  Filled 2014-08-19: qty 2

## 2014-08-19 MED ORDER — NICOTINE 14 MG/24HR TD PT24
14.0000 mg | MEDICATED_PATCH | Freq: Every day | TRANSDERMAL | Status: DC
  Administered 2014-08-19 – 2014-08-27 (×9): 14 mg via TRANSDERMAL
  Filled 2014-08-19 (×11): qty 1

## 2014-08-19 MED ORDER — METHOCARBAMOL 1000 MG/10ML IJ SOLN
500.0000 mg | Freq: Four times a day (QID) | INTRAVENOUS | Status: DC | PRN
  Filled 2014-08-19: qty 5

## 2014-08-19 MED ORDER — ROCURONIUM BROMIDE 100 MG/10ML IV SOLN
INTRAVENOUS | Status: DC | PRN
  Administered 2014-08-19: 30 mg via INTRAVENOUS
  Administered 2014-08-19: 10 mg via INTRAVENOUS
  Administered 2014-08-19: 20 mg via INTRAVENOUS

## 2014-08-19 MED ORDER — THROMBIN 5000 UNITS EX SOLR
CUTANEOUS | Status: DC | PRN
  Administered 2014-08-19 (×2): 5000 [IU] via TOPICAL

## 2014-08-19 MED ORDER — MICROFIBRILLAR COLL HEMOSTAT EX PADS
MEDICATED_PAD | CUTANEOUS | Status: DC | PRN
  Administered 2014-08-19: 1 via TOPICAL

## 2014-08-19 MED ORDER — HYDROMORPHONE HCL 1 MG/ML IJ SOLN
1.0000 mg | INTRAMUSCULAR | Status: DC | PRN
  Administered 2014-08-19 (×3): 1 mg via INTRAMUSCULAR
  Administered 2014-08-19: 1.5 mg via INTRAMUSCULAR
  Administered 2014-08-20 (×5): 1 mg via INTRAMUSCULAR
  Administered 2014-08-21 (×2): 1.5 mg via INTRAMUSCULAR
  Filled 2014-08-19 (×3): qty 1
  Filled 2014-08-19: qty 2
  Filled 2014-08-19 (×2): qty 1
  Filled 2014-08-19: qty 2
  Filled 2014-08-19 (×2): qty 1
  Filled 2014-08-19 (×2): qty 2

## 2014-08-19 MED ORDER — KCL IN DEXTROSE-NACL 20-5-0.45 MEQ/L-%-% IV SOLN
80.0000 mL/h | INTRAVENOUS | Status: DC
  Administered 2014-08-19 – 2014-08-25 (×7): 80 mL/h via INTRAVENOUS
  Filled 2014-08-19 (×10): qty 1000

## 2014-08-19 MED ORDER — METOPROLOL TARTRATE 25 MG PO TABS
25.0000 mg | ORAL_TABLET | Freq: Two times a day (BID) | ORAL | Status: DC
  Administered 2014-08-19 – 2014-08-24 (×12): 25 mg via ORAL
  Filled 2014-08-19 (×12): qty 1

## 2014-08-19 MED ORDER — VANCOMYCIN HCL IN DEXTROSE 1-5 GM/200ML-% IV SOLN
INTRAVENOUS | Status: AC
  Administered 2014-08-19: 1000 mg via INTRAVENOUS
  Filled 2014-08-19: qty 200

## 2014-08-19 MED ORDER — GLYCOPYRROLATE 0.2 MG/ML IJ SOLN
INTRAMUSCULAR | Status: DC | PRN
  Administered 2014-08-19: 0.6 mg via INTRAVENOUS

## 2014-08-19 MED ORDER — DOCUSATE SODIUM 100 MG PO CAPS
100.0000 mg | ORAL_CAPSULE | Freq: Two times a day (BID) | ORAL | Status: DC
  Administered 2014-08-19 – 2014-08-28 (×12): 100 mg via ORAL
  Filled 2014-08-19 (×14): qty 1

## 2014-08-19 SURGICAL SUPPLY — 50 items
BAG DECANTER FOR FLEXI CONT (MISCELLANEOUS) ×3 IMPLANT
BENZOIN TINCTURE PRP APPL 2/3 (GAUZE/BANDAGES/DRESSINGS) ×3 IMPLANT
BLADE CLIPPER SURG (BLADE) IMPLANT
BRUSH SCRUB EZ PLAIN DRY (MISCELLANEOUS) ×3 IMPLANT
CANISTER SUCT 3000ML PPV (MISCELLANEOUS) ×3 IMPLANT
CLEANER TIP ELECTROSURG 2X2 (MISCELLANEOUS) IMPLANT
CLOSURE WOUND 1/2 X4 (GAUZE/BANDAGES/DRESSINGS) ×2
DRAPE EENT ADH APERT 15X15 STR (DRAPES) ×6 IMPLANT
DRAPE LAPAROTOMY 100X72X124 (DRAPES) ×3 IMPLANT
DRSG OPSITE POSTOP 4X6 (GAUZE/BANDAGES/DRESSINGS) ×3 IMPLANT
DURAPREP 26ML APPLICATOR (WOUND CARE) ×3 IMPLANT
DURASEAL APPLICATOR TIP (TIP) ×3 IMPLANT
DURASEAL SPINE SEALANT 3ML (MISCELLANEOUS) ×3 IMPLANT
ELECT REM PT RETURN 9FT ADLT (ELECTROSURGICAL) ×3
ELECTRODE REM PT RTRN 9FT ADLT (ELECTROSURGICAL) ×1 IMPLANT
GAUZE SPONGE 4X4 12PLY STRL (GAUZE/BANDAGES/DRESSINGS) IMPLANT
GAUZE SPONGE 4X4 16PLY XRAY LF (GAUZE/BANDAGES/DRESSINGS) IMPLANT
GLOVE BIOGEL PI IND STRL 7.0 (GLOVE) ×1 IMPLANT
GLOVE BIOGEL PI IND STRL 7.5 (GLOVE) ×1 IMPLANT
GLOVE BIOGEL PI INDICATOR 7.0 (GLOVE) ×2
GLOVE BIOGEL PI INDICATOR 7.5 (GLOVE) ×2
GLOVE ECLIPSE 8.0 STRL XLNG CF (GLOVE) ×3 IMPLANT
GLOVE EXAM NITRILE LRG STRL (GLOVE) IMPLANT
GLOVE EXAM NITRILE XL STR (GLOVE) IMPLANT
GLOVE EXAM NITRILE XS STR PU (GLOVE) IMPLANT
GLOVE SURG SS PI 7.5 STRL IVOR (GLOVE) ×6 IMPLANT
GOWN STRL REUS W/ TWL LRG LVL3 (GOWN DISPOSABLE) ×1 IMPLANT
GOWN STRL REUS W/ TWL XL LVL3 (GOWN DISPOSABLE) ×1 IMPLANT
GOWN STRL REUS W/TWL 2XL LVL3 (GOWN DISPOSABLE) IMPLANT
GOWN STRL REUS W/TWL LRG LVL3 (GOWN DISPOSABLE) ×2
GOWN STRL REUS W/TWL XL LVL3 (GOWN DISPOSABLE) ×2
KIT BASIN OR (CUSTOM PROCEDURE TRAY) ×3 IMPLANT
KIT ROOM TURNOVER OR (KITS) ×3 IMPLANT
LIQUID BAND (GAUZE/BANDAGES/DRESSINGS) ×6 IMPLANT
NEEDLE HYPO 22GX1.5 SAFETY (NEEDLE) ×3 IMPLANT
NS IRRIG 1000ML POUR BTL (IV SOLUTION) ×3 IMPLANT
PACK LAMINECTOMY NEURO (CUSTOM PROCEDURE TRAY) ×3 IMPLANT
PAD ARMBOARD 7.5X6 YLW CONV (MISCELLANEOUS) ×21 IMPLANT
SPONGE SURGIFOAM ABS GEL SZ50 (HEMOSTASIS) ×3 IMPLANT
STAPLER SKIN PROX WIDE 3.9 (STAPLE) IMPLANT
STRIP CLOSURE SKIN 1/2X4 (GAUZE/BANDAGES/DRESSINGS) ×4 IMPLANT
SUT NURALON 4 0 TR CR/8 (SUTURE) ×3 IMPLANT
SUT VIC AB 2-0 OS6 18 (SUTURE) ×15 IMPLANT
SUT VIC AB 3-0 CP2 18 (SUTURE) ×3 IMPLANT
SWAB COLLECTION DEVICE MRSA (MISCELLANEOUS) IMPLANT
SYR 20ML ECCENTRIC (SYRINGE) IMPLANT
TOWEL OR 17X24 6PK STRL BLUE (TOWEL DISPOSABLE) ×3 IMPLANT
TOWEL OR 17X26 10 PK STRL BLUE (TOWEL DISPOSABLE) ×3 IMPLANT
TUBE ANAEROBIC SPECIMEN COL (MISCELLANEOUS) IMPLANT
WATER STERILE IRR 1000ML POUR (IV SOLUTION) ×3 IMPLANT

## 2014-08-19 NOTE — Op Note (Signed)
Preop diagnosis: Postoperative cerebrospinal fluid leakage Postop diagnosis: Same Procedure: Exploration of lumbar wound Removal of intralaminar Coflex Repair of dura Surgeon: Bernadean Saling  After being placed in the prone position the patient's back was prepped and draped in the usual sterile fashion. Previous lumbar incision was opened up we immediately encountered a very large collection of clear fluid consistent with spinal fluid. We evacuated without difficulty and open up the incision down to the spinous processes. We then dissected soft tissue away from the spinous processes were early healing had begun and placed a self-retaining retractor. The Coflex device was easily identified and underneath that we could see a large dural opening with exposed nerve roots. Were able to remove the Coflex without damaging the spinous processes. With microdissection technique we explored the dural opening and noted that it actually tracked under the lamina superiorly that had not had yet been removed. It was as if the dura had began to tear and then dissected self open both superiorly and inferiorly for the entire length of the decompression as well as under the superior lamina. We therefore removed more of the spinous process above and some additional lamina until the entire dural opening was exposed. We then closed it with numerous interrupted Nurolon stitches. We did a Valsalva test and saw no spinal fluid leakage. We placed some muscle wrapped in Surgicel over the dural repair and then covered it with dural sealant. We then removed the self-retaining retractor and allowed for some free bleeding on top of the dural sealant. The wound was then closed in multiple layers of Vicryls on the muscle fascia subcutaneous and subcuticular tissues. Dermabond and Steri-Strips were placed on the skin. A sterile dressing was then applied and the patient was extubated and taken to recovery room in stable condition.

## 2014-08-19 NOTE — Transfer of Care (Signed)
Immediate Anesthesia Transfer of Care Note  Patient: Kayla Maxwell  Procedure(s) Performed: Procedure(s): Exploration of Lumbar wound (N/A)  Patient Location: PACU  Anesthesia Type:General  Level of Consciousness: awake, oriented, sedated, patient cooperative and responds to stimulation  Airway & Oxygen Therapy: Patient Spontanous Breathing and Patient connected to nasal cannula oxygen  Post-op Assessment: Report given to RN, Post -op Vital signs reviewed and stable, Patient moving all extremities and Patient moving all extremities X 4  Post vital signs: Reviewed and stable  Last Vitals:  Filed Vitals:   08/19/14 0957  BP:   Pulse:   Temp: 36.6 C  Resp:     Complications: No apparent anesthesia complications

## 2014-08-19 NOTE — Progress Notes (Signed)
Patient c/o increased headache, back dressing clean dry and intact; without swelling; HOB has remained flat; patient has not eaten anything post op and only has had sips of water; IM dilaudid given prn for back pain twice post op; encouraged po intake and given vicodin prn for headache; with a small intake of food and beverage her headache is just dull now; will monitor for headache; dressing remains clean dry and intake to her back incision. Denies nausea,dizziness, and reports headache improved.

## 2014-08-19 NOTE — Anesthesia Procedure Notes (Signed)
Procedure Name: Intubation Date/Time: 08/19/2014 7:49 AM Performed by: Jacquiline Doe A Pre-anesthesia Checklist: Patient identified, Timeout performed, Emergency Drugs available, Suction available and Patient being monitored Patient Re-evaluated:Patient Re-evaluated prior to inductionOxygen Delivery Method: Circle system utilized Preoxygenation: Pre-oxygenation with 100% oxygen Intubation Type: IV induction and Cricoid Pressure applied Ventilation: Mask ventilation without difficulty and Oral airway inserted - appropriate to patient size Laryngoscope Size: Mac, 4 and Glidescope Grade View: Grade I Tube type: Oral Tube size: 7.5 mm Number of attempts: 1 Airway Equipment and Method: Rigid stylet and Video-laryngoscopy Placement Confirmation: ETT inserted through vocal cords under direct vision,  breath sounds checked- equal and bilateral and positive ETCO2 Secured at: 22 cm Tube secured with: Tape Dental Injury: Teeth and Oropharynx as per pre-operative assessment

## 2014-08-19 NOTE — Anesthesia Postprocedure Evaluation (Signed)
  Anesthesia Post-op Note  Patient: Kayla Maxwell  Procedure(s) Performed: Procedure(s): Exploration of Lumbar wound (N/A)  Patient Location: PACU  Anesthesia Type:General  Level of Consciousness: awake, alert , oriented and patient cooperative  Airway and Oxygen Therapy: Patient Spontanous Breathing  Post-op Pain: mild  Post-op Assessment: Post-op Vital signs reviewed, Patient's Cardiovascular Status Stable, Respiratory Function Stable, Patent Airway, No signs of Nausea or vomiting and Pain level controlled LLE Motor Response: Purposeful movement, Responds to commands LLE Sensation: No numbness RLE Motor Response: Purposeful movement, Responds to commands RLE Sensation: No numbness      Post-op Vital Signs: Reviewed and stable  Last Vitals:  Filed Vitals:   08/19/14 1000  BP: 167/94  Pulse: 109  Temp:   Resp: 20    Complications: No apparent anesthesia complications

## 2014-08-19 NOTE — H&P (Signed)
Kayla Maxwell is an 69 y.o. female.   Chief Complaint: Spinal fluid leakage HPI: The patient is 69 year old female who underwent a decompressive laminectomy with the intralaminar Coflex device about 6 weeks ago. Her wound healed without difficulty and there was no dural laceration at the time of surgery. A few weeks after surgery she began to experience some increasing discomfort and she was tried on conservative therapy without improvement. She underwent an MRI scan which showed a fluid collection in the soft tissues. The radiologist thought this could be infection and she underwent a sedimentation rate which was only 4. We then elected to tap the fluid collection which worsened her headache. We sent it for Gram stain and culture which were negative. We sent it for beta transferrin which was positive making this likely cerebral spinal fluid. Because of her lack of improvement with conservative therapy was elected to bring her back to the operating room at this time explore the wound evacuate the fluid and see if there was a delay dural laceration. We have had a long discussion regarding the risks and benefits of surgical intervention. The risks discussed include but are not limited to bleeding infection weakness numbness paralysis additional spinal fluid leakage sacrifice of the Coflex coma and death. We have discussed alternative methods of therapy along with the risks and benefits of nonintervention. She's had the opportunity to rest numerous questions and appears to understand. With this information in hand she has requested that we proceed with surgery.  Past Medical History  Diagnosis Date  . Allergy     takes Singulair daily as needed  . Acid reflux     takes Nexium daily  . Hyperlipidemia     takes Pravastatin daily-started taking this 6 months ago  . Constipation     takes Colace daily as needed  . Headache     sinus   . TIA (transient ischemic attack)     showed up on an MRI but she  never knew it  . Chronic back pain     stenosis  . Urinary frequency   . History of blood transfusion   . History of shingles   . Stroke     TIA'S X 2 --- one last yr and one this yr...lost her memory over 4-6 hrs.  . Sleep apnea   . Cancer     uterine cancer with removal  . Dysrhythmia     atrial fibrillation dx 04/2014  . Lumbar surgical wound fluid collection     Past Surgical History  Procedure Laterality Date  . Cesarean section  1968  . Appendectomy  1963  . Cholecystectomy  2014  . Tonsillectomy    . Breast surgery  1984    augementation  . Abdominal hysterectomy    . Colonoscopy    . Esophagogastroduodenoscopy    . Lumbar laminectomy with coflex 1 level Bilateral 07/04/2014    Procedure: Laminectomy and Foraminotomy - bilateral - Lumbar Four-Five with coflex ;  Surgeon: Karie Chimera, MD;  Location: [facility];  Service: Neurosurgery;  Laterality: Bilateral;    History reviewed. No pertinent family history. Social History:  reports that she has been smoking.  She has never used smokeless tobacco. She reports that she does not drink alcohol or use illicit drugs.  Allergies:  Allergies  Allergen Reactions  . Codeine Other (See Comments)    Makes her feel "crazy"  . Penicillins Swelling  . Sulfa Antibiotics Nausea Only and Rash  Medications Prior to Admission  Medication Sig Dispense Refill  . acetaminophen (TYLENOL) 500 MG tablet Take 1,500 mg by mouth every 8 (eight) hours as needed for mild pain.    Marland Kitchen HYDROcodone-acetaminophen (NORCO/VICODIN) 5-325 MG per tablet Take 1 tablet by mouth every 6 (six) hours as needed for moderate pain.    . metoprolol tartrate (LOPRESSOR) 25 MG tablet Take 25 mg by mouth 2 (two) times daily.    . Multiple Vitamin (MULTIVITAMIN) capsule Take 1 capsule by mouth daily.     Marland Kitchen NEXIUM 40 MG capsule Take 1 capsule by mouth daily.    . pravastatin (PRAVACHOL) 40 MG tablet take 1 tablet by mouth at bedtime 30 tablet 5  . rivaroxaban  (XARELTO) 20 MG TABS tablet Take 20 mg by mouth daily with supper.    Marland Kitchen tiZANidine (ZANAFLEX) 4 MG tablet Take 4 mg by mouth every 8 (eight) hours as needed for muscle spasms.    . montelukast (SINGULAIR) 10 MG tablet Take 1 tablet by mouth daily as needed (allergies).       Results for orders placed or performed during the hospital encounter of 08/19/14 (from the past 48 hour(s))  Basic metabolic panel     Status: Abnormal   Collection Time: 08/19/14  6:12 AM  Result Value Ref Range   Sodium 139 135 - 145 mmol/L   Potassium 4.7 3.5 - 5.1 mmol/L   Chloride 103 101 - 111 mmol/L   CO2 29 22 - 32 mmol/L   Glucose, Bld 114 (H) 65 - 99 mg/dL   BUN 13 6 - 20 mg/dL   Creatinine, Ser 1.04 (H) 0.44 - 1.00 mg/dL   Calcium 9.5 8.9 - 10.3 mg/dL   GFR calc non Af Amer 54 (L) >60 mL/min   GFR calc Af Amer >60 >60 mL/min    Comment: (NOTE) The eGFR has been calculated using the CKD EPI equation. This calculation has not been validated in all clinical situations. eGFR's persistently <60 mL/min signify possible Chronic Kidney Disease.    Anion gap 7 5 - 15  CBC     Status: Abnormal   Collection Time: 08/19/14  6:12 AM  Result Value Ref Range   WBC 8.7 4.0 - 10.5 K/uL   RBC 5.35 (H) 3.87 - 5.11 MIL/uL   Hemoglobin 16.9 (H) 12.0 - 15.0 g/dL   HCT 50.2 (H) 36.0 - 46.0 %   MCV 93.8 78.0 - 100.0 fL   MCH 31.6 26.0 - 34.0 pg   MCHC 33.7 30.0 - 36.0 g/dL   RDW 13.1 11.5 - 15.5 %   Platelets 234 150 - 400 K/uL   No results found.  Review of systems not obtained due to patient factors.  Blood pressure 161/96, pulse 92, temperature 97.8 F (36.6 C), temperature source Oral, resp. rate 18, height '5\' 5"'  (1.651 m), weight 84.823 kg (187 lb), SpO2 97 %.  Incision/Wound: there is fullness of the wound but is well-healed with no leakage or redness or drainage. Neurologically she is unchanged with no increasing weakness or numbness. Assessment/Plan Impression is that of probable spinal fluid leakage.  The plan is for expiration of her wound with repair of the dural opening.  Faythe Ghee, MD 08/19/2014, 7:25 AM

## 2014-08-19 NOTE — Progress Notes (Signed)
Placed patient on CPAP on Auto with a min/max pressure of 5.0-18.0cmH2O. RT filled with sterile water and adjusted nasal mask fit to the patient's comfort. Patient is tolerating it well and RT will continue to monitor.

## 2014-08-19 NOTE — Progress Notes (Signed)
Received patient from recovery via stretcher; patient transferred into the bed and reports bladder discomfort and abdominal pain; without urinary cath. Post op; bladder scan reveals greater then 660ml residual urine; foley cath. Placed using sterile technique; 66F to be left indwelling with flat bedrest status and for urinary retention; patient also requests Cpap from hospital for night usage; Dr. Hal Neer notified and orders received. Patient oriented to room and unit routine; dressing clean dry and intact; bed low and locked and HOB locked at flat position.

## 2014-08-19 NOTE — Anesthesia Preprocedure Evaluation (Addendum)
Anesthesia Evaluation  Patient identified by MRN, date of birth, ID band Patient awake    Reviewed: Allergy & Precautions, NPO status , Patient's Chart, lab work & pertinent test results  Airway Mallampati: II  TM Distance: >3 FB Neck ROM: Full    Dental no notable dental hx. (+) Dental Advisory Given, Partial Upper   Pulmonary sleep apnea and Continuous Positive Airway Pressure Ventilation , Current Smoker,  breath sounds clear to auscultation  Pulmonary exam normal       Cardiovascular hypertension, Pt. on medications and Pt. on home beta blockers + dysrhythmias Atrial Fibrillation Rhythm:Irregular Rate:Abnormal  A-fib-rate controlled. Took Metoprolol this AM.   Neuro/Psych  Headaches, TIAnegative psych ROS   GI/Hepatic Neg liver ROS, GERD-  Medicated,  Endo/Other  negative endocrine ROS  Renal/GU negative Renal ROS  negative genitourinary   Musculoskeletal  (+) Arthritis -, Osteoarthritis,    Abdominal   Peds  Hematology negative hematology ROS (+)   Anesthesia Other Findings   Reproductive/Obstetrics negative OB ROS                           Anesthesia Physical  Anesthesia Plan  ASA: III  Anesthesia Plan: General   Post-op Pain Management:    Induction: Intravenous  Airway Management Planned: Oral ETT and Video Laryngoscope Planned  Additional Equipment:   Intra-op Plan:   Post-operative Plan: Extubation in OR  Informed Consent: I have reviewed the patients History and Physical, chart, labs and discussed the procedure including the risks, benefits and alternatives for the proposed anesthesia with the patient or authorized representative who has indicated his/her understanding and acceptance.   Dental advisory given  Plan Discussed with: CRNA and Surgeon  Anesthesia Plan Comments:         Anesthesia Quick Evaluation

## 2014-08-20 ENCOUNTER — Encounter (HOSPITAL_COMMUNITY): Payer: Self-pay | Admitting: Neurosurgery

## 2014-08-20 MED ORDER — ONDANSETRON 4 MG PO TBDP: 4.0000 mg | ORAL_TABLET | Freq: Three times a day (TID) | ORAL | Status: DC | PRN

## 2014-08-20 MED ORDER — ONDANSETRON HCL 4 MG PO TABS: 4.0000 mg | ORAL_TABLET | Freq: Three times a day (TID) | ORAL | Status: DC | PRN

## 2014-08-20 NOTE — Care Management Note (Signed)
Case Management Note  Patient Details  Name: Kayla Maxwell MRN: [id number] Date of Birth: March 23, 1945  Subjective/Objective:                    Action/Plan:  Patient was admitted for a lumbar wound exploration. Lives at home alone. Will follow for discharge needs. Expected Discharge Date:                  Expected Discharge Plan:     In-House Referral:     Discharge planning Services     Post Acute Care Choice:    Choice offered to:     DME Arranged:    DME Agency:     HH Arranged:    [place] Agency:     Status of Service:     Medicare Important Message Given:    Date Medicare IM Given:    Medicare IM give by:    Date Additional Medicare IM Given:    Additional Medicare Important Message give by:     If discussed at [place] of Stay Meetings, dates discussed:    Additional Comments:  Rolm Baptise, RN 08/20/2014, 12:22 PM

## 2014-08-20 NOTE — Progress Notes (Signed)
Patient ID: Kayla Maxwell, female   DOB: 1945/03/07, 69 y.o.   MRN: [id number] [name], vss No new neuro issues Headache better. Some nausea and vomiting today. Wound clean and dry. Will continue flat in bed for a few more days.

## 2014-08-20 NOTE — Procedures (Signed)
Pt placed on home CPAP machine with own tubing and masking.  Pt is comfortably resting.  RT will continue to monitor pt.

## 2014-08-21 MED ORDER — PANTOPRAZOLE SODIUM 40 MG PO TBEC
40.0000 mg | DELAYED_RELEASE_TABLET | Freq: Every day | ORAL | Status: DC
  Administered 2014-08-21 – 2014-08-22 (×2): 40 mg via ORAL
  Filled 2014-08-21 (×2): qty 1

## 2014-08-21 MED ORDER — LORAZEPAM 1 MG PO TABS
1.0000 mg | ORAL_TABLET | Freq: Once | ORAL | Status: AC
  Administered 2014-08-21: 1 mg via ORAL
  Filled 2014-08-21: qty 1

## 2014-08-21 NOTE — Progress Notes (Signed)
Patient ID: Kayla Maxwell, female   DOB: 04-26-1945, 69 y.o.   MRN: [id number] [name], vss No new neuro issues. Apparently had some hallucinations during night. Is feeling better now with more lucid time, and less n and v as well. Will change to milder narcotic. Keeping flat for 2 more days. Doing well with that.

## 2014-08-21 NOTE — Plan of Care (Signed)
Problem: Consults Goal: Diagnosis - Spinal Surgery Outcome: Completed/Met Date Met:  08/21/14 Lumbar Laminectomy (Complex)

## 2014-08-21 NOTE — Procedures (Signed)
Pt has placed herself on her home CPAP machine.  Pt is resting comfortably.

## 2014-08-21 NOTE — Progress Notes (Signed)
Pt acutely delirious this am (Ativan given overnight for hypertension,  pt has also been given 3 doses of IM Dilaudid) having visual hallucinations per daughter since appx 7 pm yesterday.  HR and BP were both elevated overnight, pt nauseated as well requiring Zofran.

## 2014-08-21 NOTE — Progress Notes (Signed)
Pt turned Q 2 hours, ROM exercises performed on bilat LEs Q2, pt encouraged to use IS 10x/hr, family included in plan

## 2014-08-22 MED ORDER — HYDRALAZINE HCL 20 MG/ML IJ SOLN
5.0000 mg | INTRAMUSCULAR | Status: DC | PRN
  Administered 2014-08-22: 5 mg via INTRAVENOUS
  Filled 2014-08-22: qty 1

## 2014-08-22 MED ORDER — POLYETHYLENE GLYCOL 3350 17 G PO PACK
17.0000 g | PACK | Freq: Every day | ORAL | Status: DC | PRN
  Administered 2014-08-23: 17 g via ORAL
  Filled 2014-08-22 (×3): qty 1

## 2014-08-22 MED ORDER — POLYETHYLENE GLYCOL 3350 17 G PO PACK: 17.0000 g | PACK | Freq: Every day | ORAL | Status: DC

## 2014-08-22 MED ORDER — FLEET ENEMA 7-19 GM/118ML RE ENEM
1.0000 | ENEMA | Freq: Every day | RECTAL | Status: DC | PRN
  Administered 2014-08-22 – 2014-08-25 (×3): 1 via RECTAL
  Filled 2014-08-22 (×3): qty 1

## 2014-08-22 NOTE — Care Management Important Message (Signed)
Important Message  Patient Details  Name: Kayla Maxwell MRN: [id number] Date of Birth: 03-02-1945   Medicare Important Message Given:  Yes-second notification given    Delorse Lek 08/22/2014, 2:17 PM

## 2014-08-22 NOTE — Progress Notes (Signed)
Patient ID: Kayla Maxwell, female   DOB: [birth year], 69 y.o.   MRN: [id number] [name], vss Says she is feeling better. No more disorientation. Only on vicodin now. Wound is staying dry with no swelling. Will start to slowly mobilize tomorrow.  She is doing well so far.

## 2014-08-22 NOTE — Progress Notes (Signed)
MD paged in regards to blood pressure. Awaiting response.

## 2014-08-22 NOTE — Progress Notes (Signed)
RT spoke with patient about applying her home CPAP and she stated that she didn't need any assistance. RN stated that she would help if needed.  RT will continue to monitor.

## 2014-08-23 MED ORDER — PANTOPRAZOLE SODIUM 40 MG PO TBEC
40.0000 mg | DELAYED_RELEASE_TABLET | Freq: Every day | ORAL | Status: DC
  Administered 2014-08-23 – 2014-08-24 (×2): 40 mg via ORAL
  Filled 2014-08-23 (×2): qty 1

## 2014-08-23 MED ORDER — MAGNESIUM CITRATE PO SOLN: 1.0000 | Freq: Once | ORAL | Status: DC

## 2014-08-23 NOTE — Evaluation (Signed)
Physical Therapy Evaluation Patient Details Name: Kayla Maxwell MRN: [id number] DOB: [birth year] Today's Date: 08/23/2014   History of Present Illness  pt s/p exploration and repair of CSF leakage post L45 lami and foraminectomy 07/04/14.  Clinical Impression  Pt admitted with/for repair of CSF leak.  Pt currently limited functionally due to the problems listed below.  (see problems list.)  Pt will benefit from PT to maximize function and safety to be able to get home safely with available assist of family.     Follow Up Recommendations Home health PT;Other (comment) (if not back near baseline function)    Equipment Recommendations       Recommendations for Other Services       Precautions / Restrictions Precautions Precautions: Fall      Mobility  Bed Mobility Overal bed mobility: Needs Assistance Bed Mobility: Rolling;Sidelying to Sit Rolling: Min guard Sidelying to sit: Min assist       General bed mobility comments: reinforced safe technique  Transfers Overall transfer level: Needs assistance Equipment used: None Transfers: Sit to/from Stand Sit to Stand: Min assist            Ambulation/Gait Ambulation/Gait assistance: Min assist;+2 safety/equipment Ambulation Distance (Feet): 100 Feet Assistive device: 1 person hand held assist;2 person hand held assist Gait Pattern/deviations: Step-through pattern Gait velocity: slower   General Gait Details: mildly unsteady, tremulous gait  Stairs            Wheelchair Mobility    Modified Rankin (Stroke Patients Only)       Balance Overall balance assessment: Needs assistance Sitting-balance support: No upper extremity supported Sitting balance-Leahy Scale: Good     Standing balance support: No upper extremity supported Standing balance-Leahy Scale: Fair                               Pertinent Vitals/Pain Pain Assessment: No/denies pain    Home Living Family/patient expects to be  discharged to:: Private residence Living Arrangements: Alone Available Help at Discharge: Other (Comment) (sister to be with pt for several days as needed then PRN) Type of Home: Apartment Home Access: Stairs to enter   Entrance Stairs-Number of Steps: 1 Home Layout: One level Home Equipment: None      Prior Function Level of Independence: Independent               Hand Dominance        Extremity/Trunk Assessment   Upper Extremity Assessment: Defer to OT evaluation           Lower Extremity Assessment: Overall WFL for tasks assessed (proximal weakness, shakiness with gait)         Communication   Communication: No difficulties  Cognition Arousal/Alertness: Awake/alert Behavior During Therapy: WFL for tasks assessed/performed Overall Cognitive Status: Within Functional Limits for tasks assessed                      General Comments General comments (skin integrity, edema, etc.): reinforced "advised" back care/prec, log roll, lifting restrictions and progression of activity    Exercises        Assessment/Plan    PT Assessment Patient needs continued PT services  PT Diagnosis Generalized weakness   PT Problem List Decreased strength;Decreased activity tolerance;Decreased mobility;Decreased knowledge of precautions  PT Treatment Interventions Gait training;Functional mobility training;Therapeutic activities;Patient/family education   PT Goals (Current goals can be found in the Care Plan section) Acute  Rehab PT Goals Patient Stated Goal: independent PT Goal Formulation: With patient Time For Goal Achievement: 08/30/14 Potential to Achieve Goals: Good    Frequency Min 5X/week   Barriers to discharge        Co-evaluation               End of Session   Activity Tolerance: Patient tolerated treatment well Patient left: in chair;with call bell/phone within reach;with family/visitor present Nurse Communication: Mobility status          Time: 2637-8588 PT Time Calculation (min) (ACUTE ONLY): 23 min   Charges:   PT Evaluation $Initial PT Evaluation Tier I: 1 Procedure PT Treatments $Gait Training: 8-22 mins   PT G Codes:        Malayna Noori, Tessie Fass 08/23/2014, 3:05 PM 08/23/2014  Donnella Sham, PT [phone]  (pager)

## 2014-08-23 NOTE — Progress Notes (Signed)
Pt. States she is able to place her cpap on herself. RT informed pt. To notify if she needed any assistance.

## 2014-08-23 NOTE — Progress Notes (Signed)
Patient ID: Kayla Maxwell, female   DOB: 09/13/1945, 69 y.o.   MRN: [id number] [name], vss No new neuro issues Lots of vague complaints such as neck pain when her head was put up, constipation, diastolic BP of 642. Her wound looks excellent, and it is time to start to mobilize her. Will slowly start to increase activity as tolerated, but will go slow as to not stress the surgical repair.

## 2014-08-24 MED ORDER — SODIUM CHLORIDE 0.9 % IV BOLUS (SEPSIS)
500.0000 mL | Freq: Once | INTRAVENOUS | Status: AC
  Administered 2014-08-24: 500 mL via INTRAVENOUS

## 2014-08-24 MED ORDER — ALUM & MAG HYDROXIDE-SIMETH 200-200-20 MG/5ML PO SUSP
30.0000 mL | Freq: Four times a day (QID) | ORAL | Status: DC | PRN
  Filled 2014-08-24: qty 30

## 2014-08-24 MED ORDER — MAGNESIUM CITRATE PO SOLN
1.0000 | Freq: Once | ORAL | Status: AC
  Administered 2014-08-24: 1 via ORAL
  Filled 2014-08-24: qty 296

## 2014-08-24 MED ORDER — ALUM & MAG HYDROXIDE-SIMETH 200-200-20 MG/5ML PO SUSP
30.0000 mL | ORAL | Status: DC | PRN
  Administered 2014-08-24: 30 mL via ORAL
  Filled 2014-08-24: qty 30

## 2014-08-24 MED ORDER — BISACODYL 10 MG RE SUPP
10.0000 mg | Freq: Every day | RECTAL | Status: DC | PRN
  Administered 2014-08-24: 10 mg via RECTAL
  Filled 2014-08-24: qty 1

## 2014-08-24 NOTE — Progress Notes (Signed)
Pt was due to void by 2030. Pt ambulated to bathroom. Unable to void. Bladder scan reveals 46mls. Denies urge to void or pain upon palpation. Will continue to monitor. Bobbye Charleston, RN

## 2014-08-24 NOTE — Progress Notes (Signed)
Patient ID: Kayla Maxwell, female   DOB: [birth year], 69 y.o.   MRN: [id number] [name], vss No new neuro issues. Feeling much better today. No headache as she increases her activity. Will continue to increase activity, and plan for d./c Monday.

## 2014-08-24 NOTE — Progress Notes (Signed)
Patient has home CPAP and said that she will put it on herself. She did not want sterile water placed in it and RT will continue to monitor.

## 2014-08-24 NOTE — Progress Notes (Signed)
Fleet enema effective. Will continue to monitor. Bobbye Charleston, RN

## 2014-08-24 NOTE — Progress Notes (Signed)
Pt bladder scanned at 0339 with 152 mls present. In and out catheter let out 125 mls of tea colored urine. Pt's abdomen remains distended and episode of bile colored emesis occurred at 0600. Denies h/a. 4mg  Zofran administered with relief.  MD paged. 500cc bolus ordered and Fleet enema recommended. Will continue to monitor. Bobbye Charleston, RN

## 2014-08-24 NOTE — Evaluation (Signed)
Occupational Therapy Evaluation Patient Details Name: Kayla Maxwell MRN: [id number] DOB: [birth year] Today's Date: 08/24/2014    History of Present Illness pt s/p exploration and repair of CSF leakage post L45 lami and foraminectomy 07/04/14.   Clinical Impression   This 69 yo female admitted and underwent above presents to acute OT with decreased mobility, decreased balance, back precautions, and increased pain all affecting her ability to care for herself at an Independent/Mod I at home as she was pta. She will benefit from acute OT without need for follow up.    Follow Up Recommendations  No OT follow up    Equipment Recommendations  3 in 1 bedside comode       Precautions / Restrictions Precautions Precautions: Fall Precaution Comments: following back precautions would be good Restrictions Weight Bearing Restrictions: No      Mobility Bed Mobility               General bed mobility comments: Pt up in recliner upon my arrival          ADL Overall ADL's : Needs assistance/impaired Eating/Feeding: Independent;Sitting   Grooming: Set up;Sitting   Upper Body Bathing: Set up;Sitting   Lower Body Bathing: Minimal assistance;With adaptive equipment   Upper Body Dressing : Set up;Sitting   Lower Body Dressing: Minimal assistance;With adaptive equipment Lower Body Dressing Details (indicate cue type and reason): reacher and sock aid   Toilet Transfer Details (indicate cue type and reason): I demonstrated to pt and daughter how to do sit<>stand from toilet that is easier (wider base of support and external rotation of feet   Toileting - Clothing Manipulation Details (indicate cue type and reason): We spoke about using wet wipes instead of toilet paper for ease of cleaning and thus avoiding as much twisting   Tub/Shower Transfer Details (indicate cue type and reason): I took pt's daughter who is always with her when she bathes to show her how a 3n1 can be used in  the tub as a seat         Vision Additional Comments: No change from baseline          Pertinent Vitals/Pain Pain Assessment: 0-10 Pain Score: 4  Pain Location: right shoulder (a bruise is there) Pain Descriptors / Indicators: Throbbing Pain Intervention(s): Monitored during session;Heat applied     Hand Dominance Right   Extremity/Trunk Assessment Upper Extremity Assessment Upper Extremity Assessment: Overall WFL for tasks assessed           Communication Communication Communication: No difficulties   Cognition Arousal/Alertness: Awake/alert Behavior During Therapy: WFL for tasks assessed/performed Overall Cognitive Status: Within Functional Limits for tasks assessed                                Home Living Family/patient expects to be discharged to:: Private residence Living Arrangements: Alone Available Help at Discharge:  (family to be with pt a few days then intermittently) Type of Home: Apartment Home Access: Stairs to enter [employer] of Steps: 1   Home Layout: One level     Bathroom Shower/Tub: Tub only;Curtain   Bathroom Toilet: Standard (vanity beside)     Home Equipment: None          Prior Functioning/Environment Level of Independence: Independent             OT Diagnosis: Generalized weakness;Acute pain   OT Problem List: Decreased range of motion;Pain;Impaired balance (sitting  and/or standing);Obesity;Decreased knowledge of use of DME or AE   OT Treatment/Interventions: Self-care/ADL training;Patient/family education;Balance training;DME and/or AE instruction    OT Goals(Current goals can be found in the care plan section) Acute Rehab OT Goals Patient Stated Goal: to get back to fully taking care of myself OT Goal Formulation: With patient/family Time For Goal Achievement: 08/31/14 Potential to Achieve Goals: Good  OT Frequency: Min 2X/week   Barriers to D/C: Decreased caregiver support              End of Session    Activity Tolerance: Patient tolerated treatment well Patient left: in chair;with call bell/phone within reach;with family/visitor present   Time: 0352-4818 OT Time Calculation (min): 41 min Charges:  OT General Charges $OT Visit: 1 Procedure OT Evaluation $Initial OT Evaluation Tier I: 1 Procedure OT Treatments $Self Care/Home Management : 23-37 mins  Almon Register [phone] 08/24/2014, 12:06 PM

## 2014-08-24 NOTE — Progress Notes (Signed)
PT Cancellation Note  Patient Details Name: Kayla Maxwell MRN: [id number] DOB: [birth year]   Cancelled Treatment:    Reason Eval/Treat Not Completed: Medical issues which prohibited therapy. Pt declining participation in PT today due to fear of walking too far from the bathroom. She had a fleet enema this AM as well as 2 doses of colace. She reports multiple trips to the bathroom already today. PT to check back later today or tomorrow as schedule allows.   Lorriane Shire 08/24/2014, 11:05 AM

## 2014-08-25 ENCOUNTER — Inpatient Hospital Stay (HOSPITAL_COMMUNITY): Payer: Medicare Other

## 2014-08-25 ENCOUNTER — Encounter (HOSPITAL_COMMUNITY): Payer: Self-pay | Admitting: Radiology

## 2014-08-25 LAB — BASIC METABOLIC PANEL
Anion gap: 12 (ref 5–15)
Anion gap: 8 (ref 5–15)
BUN: 37 mg/dL — ABNORMAL HIGH (ref 6–20)
BUN: 37 mg/dL — ABNORMAL HIGH (ref 6–20)
CO2: 29 mmol/L (ref 22–32)
CO2: 29 mmol/L (ref 22–32)
Calcium: 9 mg/dL (ref 8.9–10.3)
Calcium: 8.3 mg/dL — ABNORMAL LOW (ref 8.9–10.3)
Chloride: 90 mmol/L — ABNORMAL LOW (ref 101–111)
Chloride: 95 mmol/L — ABNORMAL LOW (ref 101–111)
Creatinine, Ser: 1.62 mg/dL — ABNORMAL HIGH (ref 0.44–1.00)
Creatinine, Ser: 1.49 mg/dL — ABNORMAL HIGH (ref 0.44–1.00)
GFR calc Af Amer: 36 mL/min — ABNORMAL LOW (ref >60)
GFR calc Af Amer: 40 mL/min — ABNORMAL LOW (ref >60)
GFR calc non Af Amer: 31 mL/min — ABNORMAL LOW (ref >60)
GFR calc non Af Amer: 35 mL/min — ABNORMAL LOW (ref >60)
Glucose, Bld: 153 mg/dL — ABNORMAL HIGH (ref 65–99)
Glucose, Bld: 139 mg/dL — ABNORMAL HIGH (ref 65–99)
Potassium: 4.3 mmol/L (ref 3.5–5.1)
Potassium: 3.7 mmol/L (ref 3.5–5.1)
Sodium: 131 mmol/L — ABNORMAL LOW (ref 135–145)
Sodium: 132 mmol/L — ABNORMAL LOW (ref 135–145)

## 2014-08-25 LAB — CBC WITH DIFFERENTIAL/PLATELET
Basophils Absolute: 0 10*3/uL (ref 0.0–0.1)
Basophils Relative: 0 % (ref 0–1)
Eosinophils Absolute: 0.1 10*3/uL (ref 0.0–0.7)
Eosinophils Relative: 0 % (ref 0–5)
HCT: 50.5 % — ABNORMAL HIGH (ref 36.0–46.0)
Hemoglobin: 18 g/dL — ABNORMAL HIGH (ref 12.0–15.0)
Lymphocytes Relative: 13 % (ref 12–46)
Lymphs Abs: 1.7 10*3/uL (ref 0.7–4.0)
MCH: 32 pg (ref 26.0–34.0)
MCHC: 35.6 g/dL (ref 30.0–36.0)
MCV: 89.9 fL (ref 78.0–100.0)
Monocytes Absolute: 1.2 10*3/uL — ABNORMAL HIGH (ref 0.1–1.0)
Monocytes Relative: 8 % (ref 3–12)
Neutro Abs: 10.8 10*3/uL — ABNORMAL HIGH (ref 1.7–7.7)
Neutrophils Relative %: 79 % — ABNORMAL HIGH (ref 43–77)
Platelets: 318 10*3/uL (ref 150–400)
RBC: 5.62 MIL/uL — ABNORMAL HIGH (ref 3.87–5.11)
RDW: 13.1 % (ref 11.5–15.5)
WBC: 13.7 10*3/uL — ABNORMAL HIGH (ref 4.0–10.5)

## 2014-08-25 MED ORDER — DEXTROSE-NACL 5-0.45 % IV SOLN
INTRAVENOUS | Status: DC
  Administered 2014-08-25: 15:00:00 via INTRAVENOUS
  Administered 2014-08-25: 125 mL/h via INTRAVENOUS
  Administered 2014-08-26: 23:00:00 via INTRAVENOUS
  Administered 2014-08-26: 125 mL/h via INTRAVENOUS
  Administered 2014-08-26 – 2014-08-27 (×3): via INTRAVENOUS

## 2014-08-25 MED ORDER — PANTOPRAZOLE SODIUM 40 MG IV SOLR
40.0000 mg | INTRAVENOUS | Status: DC
  Administered 2014-08-25 – 2014-08-29 (×5): 40 mg via INTRAVENOUS
  Filled 2014-08-25 (×5): qty 40

## 2014-08-25 MED ORDER — METOPROLOL TARTRATE 1 MG/ML IV SOLN
5.0000 mg | Freq: Four times a day (QID) | INTRAVENOUS | Status: DC
  Administered 2014-08-25 – 2014-08-29 (×16): 5 mg via INTRAVENOUS
  Filled 2014-08-25 (×16): qty 5

## 2014-08-25 MED ORDER — SODIUM CHLORIDE 0.9 % IV BOLUS (SEPSIS)
1000.0000 mL | Freq: Once | INTRAVENOUS | Status: AC
  Administered 2014-08-25: 1000 mL via INTRAVENOUS

## 2014-08-25 NOTE — Progress Notes (Signed)
Pt did void 0430 hrs, no further emesis at time of note, abdomen remains distended.

## 2014-08-25 NOTE — Progress Notes (Signed)
Orders to place NG tube to low suction due to ileus; using aseptic technique placed ng tube to the left nare; immediate gastric return greater than 782ml  Into suction canister pt tolerated without distress continue low suction and monitor.

## 2014-08-25 NOTE — Progress Notes (Signed)
Persistent nausea and vomiting No recent bowel movement Some abdominal pain AVSS Awake and alert Moves legs well CT abd shows ileus Place NG tube - intermittent low wall suction NPO until bowel function returns

## 2014-08-25 NOTE — Progress Notes (Signed)
Pt has had several episodes of brown colored odorous emesis, 4mg  Zofran IV administered with only temporary relief, Pt abdomen very distended, lower extremities have mottled appearance, cold to touch, Colace was administered as scheduled 2117, Fleet enema at 0142. Pt did ambulate to restroom after about 5 min. However, eliminated only liquid. Did state she had mild relief and felt like she would try to sleep for awhile, Also Pt has not voided throughout day and up to 0200 when bladder scan showed 225ml. Pt advises that she has no urge to void and requested to wait for a couple more hours for her to void, intermittent cath pending if Pt does not void. 0320 hrs, another episode of emesis. Will continue to monitor and chart for changes.

## 2014-08-25 NOTE — Progress Notes (Signed)
Patient is unable to wear CPAP tonight due to NG Tube.

## 2014-08-25 NOTE — Progress Notes (Signed)
PT Cancellation Note  Patient Details Name: Kayla Maxwell MRN: [id number] DOB: 1946-01-01   Cancelled Treatment:    Reason Eval/Treat Not Completed: Medical issues which prohibited therapy. Pt had CT this AM with +ileus. Awaiting NG tube placement now. Will hold PT Rx today. PT to follow up tomorrow.   Lorriane Shire 08/25/2014, 11:10 AM

## 2014-08-25 NOTE — Significant Event (Signed)
Rapid Response Event Note  Overview: Time Called: 0806 Arrival Time: 0810 Event Type: Other (Comment)  Initial Focused Assessment:  Called by RN for a "second set of eyes".  Upon my arrival to patients room, Rn and family at bedside.  Patient lying in bed on [place] 2 lpm, has mottled extremities which are cool to touch.  +pulses noted, abd distended and firm, patient has discomfort from belly distention.  As per family has not had a BM since 8/14.  No bowel sounds heard.  VSS oK   Interventions:  Md notified and orders received, awaiting lab draw and CT scan.   Event Summary:  RN to call if assistance needed   at      at          [facility], Harlin Rain

## 2014-08-26 NOTE — Progress Notes (Signed)
Physical Therapy Treatment Patient Details Name: Kayla Maxwell MRN: [id number] DOB: 05/23/1945 Today's Date: 08/26/2014    History of Present Illness pt s/p exploration and repair of CSF leakage post L45 lami and foraminectomy 07/04/14.    PT Comments    Patient progressing well this morning. She was reeducated on back precautions and safety. Her friend that will be caring for her was present. Patient now with NG tube but had bowel movement during therapy. MD was made aware as he had just left the room. Noted IV leaking during session, RN made aware as well. Continue with current POC in plan for DC as early as tomorrow based on MD comments.   Follow Up Recommendations  Home health PT;Other (comment)     Equipment Recommendations       Recommendations for Other Services       Precautions / Restrictions Precautions Precautions: Fall Precaution Comments: Patient reeducation on back precautions as she was unable to recall Restrictions Weight Bearing Restrictions: No    Mobility  Bed Mobility Overal bed mobility: Needs Assistance Bed Mobility: Rolling;Sidelying to Sit Rolling: Min guard Sidelying to sit: Min guard          Transfers Overall transfer level: Needs assistance Equipment used: None   Sit to Stand: Min guard         General transfer comment: MG for safety. Cues for safe hand placement and technique  Ambulation/Gait Ambulation/Gait assistance: Min assist Ambulation Distance (Feet): 120 Feet Assistive device: Rolling walker (2 wheeled) Gait Pattern/deviations: Step-through pattern;Decreased stride length Gait velocity: slower   General Gait Details: mildly unsteady, tremulous gait. Min A for balance with turns as she stumbled x1. More control with second turn   Stairs            Wheelchair Mobility    Modified Rankin (Stroke Patients Only)       Balance                                    Cognition Arousal/Alertness:  Awake/alert Behavior During Therapy: WFL for tasks assessed/performed Overall Cognitive Status: Within Functional Limits for tasks assessed                      Exercises      General Comments        Pertinent Vitals/Pain Pain Assessment: No/denies pain Pain Score: 2  Pain Location: L leg Pain Descriptors / Indicators: Aching;Sore Pain Intervention(s): Monitored during session;Repositioned    Home Living                      Prior Function            PT Goals (current goals can now be found in the care plan section) Progress towards PT goals: Progressing toward goals    Frequency  Min 5X/week    PT Plan Current plan remains appropriate    Co-evaluation             End of Session   Activity Tolerance: Patient tolerated treatment well Patient left: in chair;with call bell/phone within reach;with family/visitor present;with chair alarm set     Time: 3875-6433 PT Time Calculation (min) (ACUTE ONLY): 26 min  Charges:  $Gait Training: 8-22 mins $Therapeutic Activity: 8-22 mins                    G Codes:  Jacqualyn Posey 08/26/2014, 9:32 AM 08/26/2014 Jacqualyn Posey PTA [phone] pager [phone] office

## 2014-08-26 NOTE — Progress Notes (Signed)
Patient ID: Kayla Maxwell, female   DOB: 03-18-1945, 69 y.o.   MRN: [id number] [name], vss Developed an ileus over the weekend and had NGT placed. Feels better now, and starting to pass gas. If does well today, may clamp the tube tonight.  Otherwise she is doing well, and is increasing activity well.

## 2014-08-26 NOTE — Progress Notes (Signed)
Spoke with COTA and agree with D/C.  Cathy , OTR/L [phone] 08/27/2014    Occupational Therapy Treatment Patient Details Name: Kayla Maxwell MRN: [id number] DOB: 06/26/1945 Today's Date: 08/26/2014    History of present illness pt s/p exploration and repair of CSF leakage post L45 lami and foraminectomy 07/04/14.   OT comments  Pt. At S level for all ADLS.  Able to complete LB dressing without A/E.  Pt. Clear for d/c from OT standpoint.  Will notify OTR/L.    Follow Up Recommendations  No OT follow up    Equipment Recommendations  3 in 1 bedside comode    Recommendations for Other Services      Precautions / Restrictions Precautions Precautions: Fall Precaution Comments: Patient reeducation on back precautions as she was unable to recall Restrictions Weight Bearing Restrictions: No       Mobility Bed Mobility Overal bed mobility: Needs Assistance Bed Mobility: Rolling;Sidelying to Sit Rolling: Min guard Sidelying to sit: Min guard       General bed mobility comments: in b.room upon arrival  Transfers Overall transfer level: Needs assistance Equipment used: Rolling walker (2 wheeled) Transfers: Sit to/from Stand;Stand Pivot Transfers Sit to Stand: Supervision Stand pivot transfers: Supervision       General transfer comment: MG for safety. Cues for safe hand placement and technique    Balance                                   ADL Overall ADL's : Needs assistance/impaired     Grooming: Wash/dry hands;Supervision/safety;Standing               Lower Body Dressing: Set up;Sitting/lateral leans;Sit to/from stand   Toilet Transfer: Supervision/safety;Ambulation;BSC;Regular Toilet;Grab bars;RW   Toileting- Clothing Manipulation and Hygiene: Supervision/safety;Sitting/lateral lean;Sit to/from stand Toileting - Clothing Manipulation Details (indicate cue type and reason): uses a wet washcloth for thoroughness Tub/ Shower Transfer:  Tub transfer;Supervision/safety;Adhering to back precautions;Rolling walker Tub/Shower Transfer Details (indicate cue type and reason): able to side step over simulated height of tub and walk hands over into the tub/shower. will have a seat in base of tub   General ADL Comments: very motivated, moving well and able to integrate back precautions into functional tasks without cues.       Vision                     Perception     Praxis      Cognition   Behavior During Therapy: WFL for tasks assessed/performed Overall Cognitive Status: Within Functional Limits for tasks assessed                       Extremity/Trunk Assessment               Exercises     Shoulder Instructions       General Comments      Pertinent Vitals/ Pain       Pain Assessment: No/denies pain Pain Score: 2  Pain Location: L leg Pain Descriptors / Indicators: Aching;Sore Pain Intervention(s): Monitored during session;Repositioned  Home Living                                          Prior Functioning/Environment                Frequency Min 2X/week     Progress Toward Goals  OT Goals(current goals can now be found in the care plan section)  Progress towards OT goals: Goals met/education completed, patient discharged from OT     Plan Discharge plan remains appropriate    Co-evaluation                 End of Session Equipment Utilized During Treatment: Gait belt;Rolling walker   Activity Tolerance Patient tolerated treatment well   Patient Left     Nurse Communication          Time: 0911-0922 OT Time Calculation (min): 11 min  Charges: OT General Charges $OT Visit: 1 Procedure OT Treatments $Self Care/Home Management : 8-22 mins  Morris, Jennifer Lorraine, COTA/L 08/26/2014, 9:38 AM    

## 2014-08-27 MED ORDER — RIVAROXABAN 20 MG PO TABS: 20.0000 mg | ORAL_TABLET | Freq: Every day | ORAL | Status: DC

## 2014-08-27 MED ORDER — RIVAROXABAN 15 MG PO TABS: 15.0000 mg | ORAL_TABLET | Freq: Every day | ORAL | Status: DC

## 2014-08-27 MED ORDER — RIVAROXABAN 20 MG PO TABS
20.0000 mg | ORAL_TABLET | Freq: Every day | ORAL | Status: DC
  Administered 2014-08-27 – 2014-08-28 (×2): 20 mg via ORAL
  Filled 2014-08-27 (×2): qty 1

## 2014-08-27 NOTE — Care Management Note (Signed)
Case Management Note  Patient Details  Name: Kayla Maxwell MRN: [id number] Date of Birth: Feb 06, 1945  Subjective/Objective:                    Action/Plan: Met with patient to discuss discharge needs. Patient has orders for a rolling walker and tub/shower seat.  Therapy has also recommended an adaptive equipment kit.  CM explained to patient that tub/shower seats and adaptive equipment kits are not covered by insurance. Patient has chosen to purchase those items independently after discharge.  [facility] was notified of need for rolling walker prior to discharge home tomorrow.  Expected Discharge Date:                  Expected Discharge Plan:  Home/Self Care  In-House Referral:     Discharge planning Services  CM Consult  Post Acute Care Choice:  Durable Medical Equipment Choice offered to:     DME Arranged:  Walker rolling DME Agency:  [place]:    [place]:     Status of Service:  Completed, signed off  Medicare Important Message Given:  Yes-second notification given Date Medicare IM Given:    Medicare IM give by:    Date Additional Medicare IM Given:    Additional Medicare Important Message give by:     If discussed at [place] of Stay Meetings, dates discussed:    Additional Comments:  Rolm Baptise, RN 08/27/2014, 1:51 PM

## 2014-08-27 NOTE — Progress Notes (Signed)
Patient has home CPAP unit set up at bedside.  Patient is able to place on/off herself as needed.

## 2014-08-27 NOTE — Progress Notes (Signed)
Physical Therapy Treatment Patient Details Name: Kayla Maxwell MRN: [id number] DOB: 01-06-1945 Today's Date: 08/27/2014    History of Present Illness pt s/p exploration and repair of CSF leakage post L45 lami and foraminectomy 07/04/14.    PT Comments    Patient continues to progress with ambulation. She stated that she has walked several times since yesterday. She feels fatigue due to the inability to eat while on the NG tube. Will continue with current POC. Family friend present throughout  Follow Up Recommendations  Home health PT;Other (comment)     Equipment Recommendations       Recommendations for Other Services       Precautions / Restrictions Precautions Precautions: Fall Precaution Comments: Patient able to recall all precautions this session    Mobility  Bed Mobility Overal bed mobility: Needs Assistance   Rolling: Supervision Sidelying to sit: Supervision       General bed mobility comments: Cues to maintain log roll technique  Transfers Overall transfer level: Needs assistance Equipment used: Rolling walker (2 wheeled)   Sit to Stand: Supervision         General transfer comment: Cues for hand placement  Ambulation/Gait Ambulation/Gait assistance: Min guard Ambulation Distance (Feet): 180 Feet Assistive device: Rolling walker (2 wheeled) Gait Pattern/deviations: Step-through pattern;Decreased stride length Gait velocity: slower Gait velocity interpretation: Below normal speed for age/gender General Gait Details: mildly unsteady, tremulous gait. No LOB noted this session   Stairs            Wheelchair Mobility    Modified Rankin (Stroke Patients Only)       Balance                                    Cognition Arousal/Alertness: Awake/alert Behavior During Therapy: WFL for tasks assessed/performed Overall Cognitive Status: Within Functional Limits for tasks assessed                      Exercises       General Comments        Pertinent Vitals/Pain Pain Assessment: No/denies pain    Home Living                      Prior Function            PT Goals (current goals can now be found in the care plan section) Progress towards PT goals: Progressing toward goals    Frequency  Min 5X/week    PT Plan Current plan remains appropriate    Co-evaluation             End of Session   Activity Tolerance: Patient limited by fatigue Patient left: in chair;with call bell/phone within reach;with family/visitor present     Time: 2878-6767 PT Time Calculation (min) (ACUTE ONLY): 16 min  Charges:  $Gait Training: 8-22 mins                    G Codes:      Jacqualyn Posey 08/27/2014, 10:57 AM 08/27/2014 Jacqualyn Posey PTA [phone] pager [phone] office

## 2014-08-27 NOTE — Progress Notes (Signed)
Patient ID: Kayla Maxwell, female   DOB: 02-Aug-1945, 69 y.o.   MRN: [id number] [name], vss No new neuro issues Has had BM and passing gas. NGT clamped since yesterday. Will remove today. Wound clean and dry without drainage. Will advance diet and hopefully d/c tomorrow.  She feels she needs some DME for home and will arrange that.

## 2014-08-28 LAB — BASIC METABOLIC PANEL
Anion gap: 7 (ref 5–15)
BUN: 8 mg/dL (ref 6–20)
CO2: 30 mmol/L (ref 22–32)
Calcium: 8 mg/dL — ABNORMAL LOW (ref 8.9–10.3)
Chloride: 99 mmol/L — ABNORMAL LOW (ref 101–111)
Creatinine, Ser: 1.02 mg/dL — ABNORMAL HIGH (ref 0.44–1.00)
GFR calc Af Amer: >60 mL/min (ref >60)
GFR calc non Af Amer: 55 mL/min — ABNORMAL LOW (ref >60)
Glucose, Bld: 117 mg/dL — ABNORMAL HIGH (ref 65–99)
Potassium: 2.8 mmol/L — ABNORMAL LOW (ref 3.5–5.1)
Sodium: 136 mmol/L (ref 135–145)

## 2014-08-28 NOTE — Progress Notes (Signed)
PT Cancellation Note  Patient Details Name: Kayla Maxwell MRN: [id number] DOB: [birth year]   Cancelled Treatment:    Reason Eval/Treat Not Completed: Patient declined, no reason specified Patient declines to work with therapy. States she has been ambulatory throughout the day with staff and would like to rest at this time. Verbally reviewed precautions and safety with mobility. States she feels confident with all tasks she has been performing and is eager to return home.  [name] 08/28/2014, 6:18 PM [name], [place]

## 2014-08-28 NOTE — Progress Notes (Signed)
Patient has home CPAP,mask and tubing. Places self on and off. RT made patient aware that if she needed any assistance to call.

## 2014-08-28 NOTE — Progress Notes (Signed)
Patient ID: Kayla Maxwell, female   DOB: 08-Feb-1945, 69 y.o.   MRN: [id number] [name], vss [place] on liquids, but ready to advance diet. Wound clean and dry. Will try regular diet, and home when she feels ready.

## 2014-08-28 NOTE — Care Management Important Message (Signed)
Important Message  Patient Details  Name: Kayla Maxwell MRN: [id number] Date of Birth: [birth year]   Medicare Important Message Given:  Yes-third notification given    Delorse Lek 08/28/2014, 10:50 AM

## 2014-08-29 NOTE — Progress Notes (Signed)
Pt discharging with family friend at this time taking all personal belongings. IV discontinued, dry dressing applied. Discharge instructions provided with verbal understanding. Pt has contacted MD office awaiting call back for follow up appt. No noted distress. Pt denies pain or discomfort.

## 2014-08-29 NOTE — Discharge Summary (Signed)
  Physician Discharge Summary  Patient ID: Kayla Maxwell MRN: [id number] DOB/AGE: 69-30-1947 69 y.o.  Admit date: 08/19/2014 Discharge date: 08/29/2014  Admission Diagnoses:  Discharge Diagnoses:  Active Problems:   Postoperative CSF leak   Discharged Condition: good  Hospital Course: Surgery 11 days ago for delayed dural tear; taken for repair, and kept flat for days after procedure. Wound healed well. She then developed an ileus that required NGT drainage, but that resolved quickly and she was able to advance her diet without difficulty from that point forward; by pod 10, she was ambulating well. Her wound was healing well with no drainage, swelling or redness; she was discharged home with specific instructions given. She said she was having no pain and needed no pain medication scrip.  Consults: None  Significant Diagnostic Studies: none  Treatments: surgery: Explration of lumbar wound with repair of dural opening  Discharge Exam: Blood pressure 101/80, pulse 92, temperature 99 F (37.2 C), temperature source Oral, resp. rate 20, height 5\' 5"  (1.651 m), weight 84.823 kg (187 lb), SpO2 98 %. Incision/Wound:clean and dry; no new neuro issues  Disposition: 01-Home or Self Care     Medication List    ASK your doctor about these medications        acetaminophen 500 MG tablet  Commonly known as:  TYLENOL  Take 1,500 mg by mouth every 8 (eight) hours as needed for mild pain.     HYDROcodone-acetaminophen 5-325 MG per tablet  Commonly known as:  NORCO/VICODIN  Take 1 tablet by mouth every 6 (six) hours as needed for moderate pain.     metoprolol tartrate 25 MG tablet  Commonly known as:  LOPRESSOR  Take 25 mg by mouth 2 (two) times daily.     montelukast 10 MG tablet  Commonly known as:  SINGULAIR  Take 1 tablet by mouth daily as needed (allergies).     multivitamin capsule  Take 1 capsule by mouth daily.     NEXIUM 40 MG capsule  Generic drug:  esomeprazole  Take  1 capsule by mouth daily.     pravastatin 40 MG tablet  Commonly known as:  PRAVACHOL  take 1 tablet by mouth at bedtime     rivaroxaban 20 MG Tabs tablet  Commonly known as:  XARELTO  Take 20 mg by mouth daily with supper.     tiZANidine 4 MG tablet  Commonly known as:  ZANAFLEX  Take 4 mg by mouth every 8 (eight) hours as needed for muscle spasms.         At home rest most of the time. Get up 9 or 10 times each day and take a 15 or 20 minute walk. No riding in the car and to your first postoperative appointment. If you have neck surgery you may shower from the chest down starting on the third postoperative day. If you had back surgery he may start showering on the third postoperative day with saran wrap wrapped around your incisional area 3 times. After the shower remove the saran wrap. Take pain medicine as needed and other medications as instructed. Call my office for an appointment.  SignedFaythe Ghee, MD 08/29/2014, 11:25 AM

## 2014-08-29 NOTE — Progress Notes (Signed)
PT Cancellation Note  Patient Details Name: Kayla Maxwell MRN: [id number] DOB: 08/24/1945   Cancelled Treatment:    Reason Eval/Treat Not Completed: Patient declined, no reason specified Again, pt declines PT services. States she has no further questions and plans to d/c soon. Feels confident she can perform all daily tasks without further PT.  [name] 08/29/2014, 2:03 PM Camille Bal Burd[place], [place]

## 2014-09-05 ENCOUNTER — Ambulatory Visit (INDEPENDENT_AMBULATORY_CARE_PROVIDER_SITE_OTHER): Payer: Medicare Other | Admitting: Family Medicine

## 2014-09-05 ENCOUNTER — Telehealth: Payer: Self-pay

## 2014-09-05 ENCOUNTER — Ambulatory Visit
Admission: RE | Admit: 2014-09-05 | Discharge: 2014-09-05 | Disposition: A | Discharge status: Home / Self Care | Payer: Medicare Other | Source: Ambulatory Visit | Attending: Family Medicine | Admitting: Family Medicine

## 2014-09-05 ENCOUNTER — Encounter: Payer: Self-pay | Admitting: Family Medicine

## 2014-09-05 VITALS — BP 110/72 | HR 100 | Temp 97.8°F | Resp 16 | Ht 64.0 in | Wt 174.0 lb

## 2014-09-05 DIAGNOSIS — R600 Localized edema: Secondary | ICD-10-CM | POA: Insufficient documentation

## 2014-09-05 DIAGNOSIS — R609 Edema, unspecified: Secondary | ICD-10-CM

## 2014-09-05 DIAGNOSIS — D582 Other hemoglobinopathies: Secondary | ICD-10-CM | POA: Insufficient documentation

## 2014-09-05 DIAGNOSIS — K219 Gastro-esophageal reflux disease without esophagitis: Secondary | ICD-10-CM | POA: Insufficient documentation

## 2014-09-05 DIAGNOSIS — M519 Unspecified thoracic, thoracolumbar and lumbosacral intervertebral disc disorder: Secondary | ICD-10-CM | POA: Insufficient documentation

## 2014-09-05 DIAGNOSIS — Z9889 Other specified postprocedural states: Secondary | ICD-10-CM | POA: Diagnosis not present

## 2014-09-05 DIAGNOSIS — E782 Mixed hyperlipidemia: Secondary | ICD-10-CM | POA: Insufficient documentation

## 2014-09-05 DIAGNOSIS — N3090 Cystitis, unspecified without hematuria: Secondary | ICD-10-CM | POA: Diagnosis not present

## 2014-09-05 DIAGNOSIS — Z8673 Personal history of transient ischemic attack (TIA), and cerebral infarction without residual deficits: Secondary | ICD-10-CM | POA: Insufficient documentation

## 2014-09-05 DIAGNOSIS — R413 Other amnesia: Secondary | ICD-10-CM | POA: Insufficient documentation

## 2014-09-05 DIAGNOSIS — E785 Hyperlipidemia, unspecified: Secondary | ICD-10-CM | POA: Insufficient documentation

## 2014-09-05 DIAGNOSIS — J309 Allergic rhinitis, unspecified: Secondary | ICD-10-CM | POA: Insufficient documentation

## 2014-09-05 LAB — POCT URINALYSIS DIPSTICK
Bilirubin, UA: NEGATIVE
Glucose, UA: NEGATIVE
Ketones, UA: NEGATIVE
Nitrite, UA: POSITIVE
Protein, UA: 30
Spec Grav, UA: 1.015
Urobilinogen, UA: 0.2
pH, UA: 6

## 2014-09-05 MED ORDER — CIPROFLOXACIN HCL 250 MG PO TABS: 250.0000 mg | ORAL_TABLET | Freq: Two times a day (BID) | ORAL | Status: DC

## 2014-09-05 NOTE — Progress Notes (Signed)
Patient ID: Kayla Maxwell, female   DOB: Jul 29, 1945, 69 y.o.   MRN: [id number]       Patient: Kayla Maxwell Female    DOB: [birth year]   70 y.o.   MRN: [id number] Visit Date: 09/05/2014  Today's Provider: Margarita Rana, MD   Chief Complaint  Patient presents with  . Foot Swelling  . Urinary Tract Infection   Subjective:    HPI Edema: Patient complains of edema. The location of the edema is feet bilateral.  The edema has been severe.  Onset of symptoms was 3 weeks ago, unchanged since that time. The edema is present all day. The patient states never.  The swelling has been aggravated by nothing, relieved by elevation of involved area, and been associated with recent surgery 08/19/2014 (back surgery).   Urinary Tract Infection: Patient complains of burning with urination She has had symptoms for 4 days. Patient denies vaginal discharge. Patient does have a history of recurrent UTI per pt a long time ago.  Patient does not have a history of pyelonephritis. Patient reports that she did have a cathter in while she was admitted at [facility] for her back surgery.       Allergies  Allergen Reactions  . Codeine Other (See Comments)    Makes her feel "crazy"  . Penicillins Swelling  . Sulfa Antibiotics Nausea Only and Rash   Previous Medications   ACETAMINOPHEN (TYLENOL) 500 MG TABLET    Take 1,500 mg by mouth every 8 (eight) hours as needed for mild pain.   METOPROLOL TARTRATE (LOPRESSOR) 25 MG TABLET    Take 25 mg by mouth 2 (two) times daily.   MONTELUKAST (SINGULAIR) 10 MG TABLET    Take 1 tablet by mouth daily as needed (allergies).    MULTIPLE VITAMIN (MULTIVITAMIN) CAPSULE    Take 1 capsule by mouth daily.    NEXIUM 40 MG CAPSULE    Take 1 capsule by mouth daily.   PRAVASTATIN (PRAVACHOL) 40 MG TABLET    take 1 tablet by mouth at bedtime   RIVAROXABAN (XARELTO) 20 MG TABS TABLET    Take 20 mg by mouth daily with supper.    Review of Systems  Constitutional: Negative.     Cardiovascular: Positive for leg swelling.  Genitourinary: Positive for difficulty urinating.  Neurological: Negative.   Psychiatric/Behavioral: Negative.     Social History  Substance Use Topics  . Smoking status: Current Every Day Smoker -- 1.00 packs/day for 45 years  . Smokeless tobacco: Never Used  . Alcohol Use: No     Comment: Rarely   Objective:   BP 110/72 mmHg  Pulse 100  Temp(Src) 97.8 F (36.6 C) (Oral)  Resp 16  Ht 5\' 4"  (1.626 m)  Wt 174 lb (78.926 kg)  BMI 29.85 kg/m2  SpO2 96%  Physical Exam  Constitutional: She is oriented to person, place, and time. She appears well-developed and well-nourished.  Cardiovascular: Normal rate and regular rhythm.   Musculoskeletal: Edema: right lower extremity at foot.  Good pulses.   Neurological: She is alert and oriented to person, place, and time.        Assessment & Plan:     1. Cystitis Condition is worsening. Will start medication for better control.   - POCT urinalysis dipstick - ciprofloxacin (CIPRO) 250 MG tablet; Take 1 tablet (250 mg total) by mouth 2 (two) times daily.  Dispense: 10 tablet; Refill: 0 - Urine culture Results for orders placed or performed in visit on  09/05/14  POCT urinalysis dipstick  Result Value Ref Range   Color, UA straw    Clarity, UA cloudy    Glucose, UA neg    Bilirubin, UA neg    Ketones, UA neg    Spec Grav, UA 1.015    Blood, UA large    pH, UA 6.0    Protein, UA 30    Urobilinogen, UA 0.2    Nitrite, UA positive    Leukocytes, UA large (3+) (A) Negative    2. Edema- Recommend support hose. Will rule out DVT.  BP too low to try fluid pill at this point.  - US Venous Img Lower Unilateral Right; Future     Margarita Rana, MD  [facility]

## 2014-09-05 NOTE — Telephone Encounter (Signed)
Call report, right leg negative for blood clot. sd

## 2014-09-06 ENCOUNTER — Telehealth: Payer: Self-pay

## 2014-09-06 MED ORDER — MEDICAL COMPRESSION SOCKS MISC: Status: DC

## 2014-09-06 NOTE — Telephone Encounter (Signed)
Patient called and reports that she would like a rx for compression hose. Patient is wanting to know if this can be called in? Patient uses [facility] in [place]. Thanks!

## 2014-09-06 NOTE — Telephone Encounter (Signed)
Ok to call with verbal order, knee high, medium compression, maybe 20 to 30. Do not know if patient gets from pharmacy or medical supply store. Think it may be the second one.

## 2014-09-06 NOTE — Telephone Encounter (Signed)
[employer] of results. She reports that she will advise patient, and patient will give [place] a call back in regards to the compression hose.

## 2014-09-06 NOTE — Telephone Encounter (Signed)
RX called into [facility] in [place].   Thanks,   -Mickel Baas

## 2014-09-06 NOTE — Telephone Encounter (Signed)
-----   Message from Margarita Rana, MD sent at 09/05/2014  3:01 PM EDT ----- No clot.   Please notify patient.  And see if she wants to pick up rx for compression hose. Thanks.

## 2014-09-08 LAB — URINE CULTURE

## 2014-09-08 LAB — PLEASE NOTE

## 2014-09-10 ENCOUNTER — Telehealth: Payer: Self-pay

## 2014-09-10 NOTE — Telephone Encounter (Signed)
Pt advised; she reports feeling better.    Thanks,   -Mickel Baas

## 2014-09-10 NOTE — Telephone Encounter (Signed)
-----   Message from Margarita Rana, MD sent at 09/08/2014  8:49 PM EDT ----- Had bladder infection.  Treated with appropriate antibiotic. Please see how patient is doing. Thanks.

## 2014-10-07 ENCOUNTER — Other Ambulatory Visit: Payer: Self-pay | Admitting: Family Medicine

## 2014-10-07 DIAGNOSIS — K219 Gastro-esophageal reflux disease without esophagitis: Secondary | ICD-10-CM

## 2014-10-07 MED ORDER — ESOMEPRAZOLE MAGNESIUM 40 MG PO CPDR: 40.0000 mg | DELAYED_RELEASE_CAPSULE | Freq: Every day | ORAL | Status: DC

## 2014-10-07 NOTE — Telephone Encounter (Signed)
Needs refill generic nexium 40 mg... [facility] in [place].  Call back is [phone]  [facility]

## 2014-10-25 ENCOUNTER — Telehealth: Payer: Self-pay

## 2014-10-25 NOTE — Telephone Encounter (Signed)
Advised [name] that Ms. Pavon has an appointment for Tuesday.  Thanks,   -Mickel Baas

## 2014-10-25 NOTE — Telephone Encounter (Signed)
Pt's daughter called reporting that she is worried about her mother Mrs. Hyle being depressed. Per Daughter pt does not want to go out of her house and stays in her pajamas all day long. Daughter reports that her mother has been like this since her last back surgery in August. [name] is very concerned that her mother may try to hurt her self. [name] is requesting for Dr. Venia Minks to call her so that she can figurer out how to get Mrs. Laroche in her for an ov, with out Mrs. Ohlinger knowing that chrystal called the office. [phone]

## 2014-10-25 NOTE — Telephone Encounter (Signed)
Pt advised; apt made for 10/29/2014  Thanks,   -Mickel Baas

## 2014-10-25 NOTE — Telephone Encounter (Signed)
Please call patient and let her know she was supposed to have follow up from UTI and leg swelling. Thanks.

## 2014-10-29 ENCOUNTER — Telehealth: Payer: Self-pay | Admitting: Family Medicine

## 2014-10-29 ENCOUNTER — Encounter: Payer: Self-pay | Admitting: Family Medicine

## 2014-10-29 ENCOUNTER — Ambulatory Visit (INDEPENDENT_AMBULATORY_CARE_PROVIDER_SITE_OTHER): Payer: Medicare Other | Admitting: Family Medicine

## 2014-10-29 VITALS — BP 122/64 | HR 64 | Temp 97.7°F | Resp 16 | Wt 182.0 lb

## 2014-10-29 DIAGNOSIS — Z23 Encounter for immunization: Secondary | ICD-10-CM

## 2014-10-29 DIAGNOSIS — R609 Edema, unspecified: Secondary | ICD-10-CM | POA: Diagnosis not present

## 2014-10-29 DIAGNOSIS — L259 Unspecified contact dermatitis, unspecified cause: Secondary | ICD-10-CM | POA: Diagnosis not present

## 2014-10-29 DIAGNOSIS — F4310 Post-traumatic stress disorder, unspecified: Secondary | ICD-10-CM | POA: Diagnosis not present

## 2014-10-29 DIAGNOSIS — N3090 Cystitis, unspecified without hematuria: Secondary | ICD-10-CM | POA: Diagnosis not present

## 2014-10-29 LAB — POCT URINALYSIS DIPSTICK
Bilirubin, UA: NEGATIVE
Glucose, UA: NEGATIVE
Ketones, UA: NEGATIVE
Leukocytes, UA: NEGATIVE
Nitrite, UA: NEGATIVE
Protein, UA: NEGATIVE
Spec Grav, UA: 1.025
Urobilinogen, UA: 0.2
pH, UA: 5

## 2014-10-29 MED ORDER — ESCITALOPRAM OXALATE 10 MG PO TABS: 10.0000 mg | ORAL_TABLET | Freq: Every day | ORAL | Status: DC

## 2014-10-29 MED ORDER — BETAMETHASONE DIPROPIONATE 0.05 % EX CREA: TOPICAL_CREAM | Freq: Two times a day (BID) | CUTANEOUS | Status: DC

## 2014-10-29 NOTE — Telephone Encounter (Signed)
Pt states she was in today and discussed having hip pain on both sides.  Pt is asking if she needs to have a x-ray? CB#[phone]/MW

## 2014-10-29 NOTE — Telephone Encounter (Signed)
Please advise. Lavon Bothwell Drozdowski, CMA  

## 2014-10-29 NOTE — Progress Notes (Signed)
Patient ID: Kayla Maxwell, female   DOB: 1945/12/23, 69 y.o.   MRN: [id number]       Patient: Kayla Maxwell Female    DOB: 1945/01/23   69 y.o.   MRN: [id number] Visit Date: 10/29/2014  Today's Provider: Margarita Rana, MD   Chief Complaint  Patient presents with  . Edema    from 1 month ago  . Follow-up    UTI  . Depression    possible.    Subjective:    HPI Edema- Pt was seen 1 month ago for swelling in legs. She was instructed to try support hose. She has done so and her swelling is better.  Cystitis- She also had a bladder infection and was treated. She is no longer having any symptoms.   Per message from 10/25/14- Pt daughter called and reported that she thinks pt is depressed. She does not want to come out of the house, she stays in her pajamas all the time. This has been going on since her surgery in August. Pt daughter, Kayla Maxwell does not want her mom to know that she called to make her this appt, so the pt thinks she is here for a follow up from edema and cystitis. She is very concerned about pt hurting herself.  Not suicidal.  Pt states "I think I have PTSD from all the pain and suffering from surgery".  Original reason she had the surgery is no better. Does not want to depend on people.  Surgeon ignored the spinal leak. Sent her home. Drew fluid out without gloves or attendant 6 weeks after initial surgery and scheduled surgery after fluid was identified.  Patient was in terrible pain for 6 weeks. Just can not get better. Has a lot of frustration and anxiety and anhedonia.    Pt has quit smoking!!!   Pt would like flu vaccine and Shingles vaccine.     Allergies  Allergen Reactions  . Codeine Other (See Comments)    Makes her feel "crazy"  . Penicillins Swelling  . Sulfa Antibiotics Nausea Only and Rash   Previous Medications   ACETAMINOPHEN (TYLENOL) 500 MG TABLET    Take 1,500 mg by mouth every 8 (eight) hours as needed for mild pain.   CIPROFLOXACIN (CIPRO) 250 MG  TABLET    Take 1 tablet (250 mg total) by mouth 2 (two) times daily.   ELASTIC BANDAGES & SUPPORTS (MEDICAL COMPRESSION SOCKS) MISC    Knee high, to be worn daily.   ESOMEPRAZOLE (NEXIUM) 40 MG CAPSULE    Take 1 capsule (40 mg total) by mouth daily.   METOPROLOL TARTRATE (LOPRESSOR) 25 MG TABLET    Take 25 mg by mouth 2 (two) times daily.   MONTELUKAST (SINGULAIR) 10 MG TABLET    Take 1 tablet by mouth daily as needed (allergies).    MULTIPLE VITAMIN (MULTIVITAMIN) CAPSULE    Take 1 capsule by mouth daily.    PRAVASTATIN (PRAVACHOL) 40 MG TABLET    take 1 tablet by mouth at bedtime   PROBIOTIC PRODUCT (PROBIOTIC ADVANCED PO)    Take by mouth.   RIVAROXABAN (XARELTO) 20 MG TABS TABLET    Take 20 mg by mouth daily with supper.    Review of Systems  Constitutional: Positive for fatigue.  HENT: Negative.   Eyes: Negative.   Respiratory: Negative.   Cardiovascular: Negative.   Gastrointestinal: Negative.   Endocrine: Negative.   Genitourinary: Negative.   Musculoskeletal: Positive for back pain and arthralgias.  Skin:  Negative.   Allergic/Immunologic: Negative.   Neurological: Negative.   Hematological: Negative.   Psychiatric/Behavioral: Negative.     Social History  Substance Use Topics  . Smoking status: Former Smoker -- 1.00 packs/day for 45 years  . Smokeless tobacco: Former Systems developer    Quit date: 08/19/2014  . Alcohol Use: No     Comment: Rarely   Objective:   BP 122/64 mmHg  Pulse 64  Temp(Src) 97.7 F (36.5 C) (Oral)  Resp 16  Wt 182 lb (82.555 kg)  Physical Exam  Constitutional: She is oriented to person, place, and time. She appears well-developed and well-nourished.  Neurological: She is alert and oriented to person, place, and time. No cranial nerve deficit.  Psychiatric: She has a normal mood and affect. Her behavior is normal. Judgment and thought content normal.      Assessment & Plan:     1. Cystitis Improved today. But Marciel with blood. Will send for UA.  Is now on blood thinner.   - POCT urinalysis dipstick - Urinalysis Results for orders placed or performed in visit on 10/29/14  POCT urinalysis dipstick  Result Value Ref Range   Color, UA amber    Clarity, UA clear    Glucose, UA neg    Bilirubin, UA neg    Ketones, UA neg    Spec Grav, UA 1.025    Blood, UA moderate    pH, UA 5.0    Protein, UA neg    Urobilinogen, UA 0.2    Nitrite, UA neg    Leukocytes, UA Negative Negative    2. Edema, unspecified type Stable.   3. PTSD (post-traumatic stress disorder) New problem. Will treat with Lexapro as noted.  Daughter with her today. Will keep an eye on her. Recheck in 4 weeks. - escitalopram (LEXAPRO) 10 MG tablet; Take 1 tablet (10 mg total) by mouth daily.  Dispense: 30 tablet; Refill: 1  4. Contact dermatitis Refilled today.  - betamethasone dipropionate (DIPROLENE) 0.05 % cream; Apply topically 2 (two) times daily.  Dispense: 30 g; Refill: 1  5. Need for influenza vaccination Given today.  - Flu vaccine HIGH DOSE PF      Margarita Rana, MD  [facility]

## 2014-10-29 NOTE — Telephone Encounter (Signed)
Xray probably will not show cause of symptoms, but can order or I can recheck  At follow up and figure out who she needs to see. Maybe just make follow up in 2 weeks instead of 4. Thanks.

## 2014-10-30 LAB — URINALYSIS
Bilirubin, UA: NEGATIVE
Glucose, UA: NEGATIVE
Ketones, UA: NEGATIVE
Nitrite, UA: NEGATIVE
Protein, UA: NEGATIVE
Specific Gravity, UA: 1.024 (ref 1.005–1.030)
Urobilinogen, Ur: 0.2 mg/dL (ref 0.2–1.0)
pH, UA: 5.5 (ref 5.0–7.5)

## 2014-10-30 NOTE — Telephone Encounter (Signed)
Pt advised.  She rescheduled her appointment to 11/15.    Thanks,   -Mickel Baas

## 2014-10-31 LAB — SPECIMEN STATUS REPORT

## 2014-10-31 LAB — URINALYSIS, MICROSCOPIC ONLY
Casts: NONE SEEN /lpf
Epithelial Cells (non renal): NONE SEEN /hpf (ref 0–10)
RBC, UA: NONE SEEN /hpf (ref 0–?)
WBC, UA: NONE SEEN /hpf (ref 0–?)

## 2014-11-01 ENCOUNTER — Telehealth: Payer: Self-pay

## 2014-11-01 NOTE — Telephone Encounter (Signed)
-----   Message from Margarita Rana, MD sent at 10/31/2014  4:46 PM EDT ----- Urine ok. No blood. No need for further work up.Thanks.

## 2014-11-01 NOTE — Telephone Encounter (Signed)
Pt advised.   Thanks,   -Laura  

## 2014-11-19 ENCOUNTER — Ambulatory Visit (INDEPENDENT_AMBULATORY_CARE_PROVIDER_SITE_OTHER): Payer: Medicare Other | Admitting: Family Medicine

## 2014-11-19 ENCOUNTER — Encounter: Payer: Self-pay | Admitting: Family Medicine

## 2014-11-19 VITALS — BP 122/70 | HR 88 | Temp 98.4°F | Resp 16 | Wt 181.0 lb

## 2014-11-19 DIAGNOSIS — R21 Rash and other nonspecific skin eruption: Secondary | ICD-10-CM | POA: Diagnosis not present

## 2014-11-19 DIAGNOSIS — M545 Low back pain: Secondary | ICD-10-CM | POA: Diagnosis not present

## 2014-11-19 DIAGNOSIS — F4310 Post-traumatic stress disorder, unspecified: Secondary | ICD-10-CM | POA: Diagnosis not present

## 2014-11-19 DIAGNOSIS — F419 Anxiety disorder, unspecified: Secondary | ICD-10-CM | POA: Insufficient documentation

## 2014-11-19 MED ORDER — ESCITALOPRAM OXALATE 10 MG PO TABS: 10.0000 mg | ORAL_TABLET | Freq: Every day | ORAL | Status: DC

## 2014-11-19 NOTE — Progress Notes (Signed)
Patient ID: Kayla Maxwell, female   DOB: 07-23-1945, 69 y.o.   MRN: [id number]        Patient: Kayla Maxwell Female    DOB: November 26, 1945   69 y.o.   MRN: [id number] Visit Date: 11/19/2014  Today's Provider: Margarita Rana, MD   Chief Complaint  Patient presents with  . Anxiety   Subjective:    Anxiety Presents for follow-up visit. Onset was 1 to 6 months ago. The problem has been gradually improving. Patient reports no chest pain, confusion, decreased concentration, dizziness, excessive worry, insomnia, irritability, malaise, muscle tension, nervous/anxious behavior, palpitations, panic, restlessness, shortness of breath or suicidal ideas. The quality of sleep is fair. Nighttime awakenings: one to two.   Risk factors include prior traumatic experience. (Related to complications of surgery. ) Past treatments include SSRIs. The treatment provided moderate relief. Compliance with prior treatments has been good. Treatment side effects: Thinks she can off at end of the month.   Back Pain This is a chronic problem. The current episode started more than 1 year ago. The problem is unchanged. The pain is present in the lumbar spine. Associated symptoms include leg pain, pelvic pain and weakness. Pertinent negatives include no bladder incontinence, bowel incontinence, chest pain, fever, headaches, numbness or tingling.    Also concerned about rash.  Dry rash on several areas of the body.       Allergies  Allergen Reactions  . Codeine Other (See Comments)    Makes her feel "crazy"  . Penicillins Swelling  . Sulfa Antibiotics Nausea Only and Rash   Previous Medications   ACETAMINOPHEN (TYLENOL) 500 MG TABLET    Take 1,500 mg by mouth every 8 (eight) hours as needed for mild pain.   BETAMETHASONE DIPROPIONATE (DIPROLENE) 0.05 % CREAM    Apply topically 2 (two) times daily.   CETIRIZINE (ZYRTEC) 10 MG TABLET    Take 10 mg by mouth daily.   ELASTIC BANDAGES & SUPPORTS (MEDICAL COMPRESSION SOCKS)  MISC    Knee high, to be worn daily.   ESCITALOPRAM (LEXAPRO) 10 MG TABLET    Take 1 tablet (10 mg total) by mouth daily.   ESOMEPRAZOLE (NEXIUM) 40 MG CAPSULE    Take 1 capsule (40 mg total) by mouth daily.   METOPROLOL TARTRATE (LOPRESSOR) 25 MG TABLET    Take 25 mg by mouth 2 (two) times daily.   MULTIPLE VITAMIN (MULTIVITAMIN) CAPSULE    Take 1 capsule by mouth daily.    PRAVASTATIN (PRAVACHOL) 40 MG TABLET    take 1 tablet by mouth at bedtime   PROBIOTIC PRODUCT (PROBIOTIC ADVANCED PO)    Take by mouth.   RIVAROXABAN (XARELTO) 20 MG TABS TABLET    Take 20 mg by mouth daily with supper.    Review of Systems  Constitutional: Negative for fever, chills, diaphoresis, activity change, appetite change, irritability, fatigue and unexpected weight change.  Respiratory: Negative.  Negative for shortness of breath.   Cardiovascular: Negative.  Negative for chest pain and palpitations.  Gastrointestinal: Negative.  Negative for bowel incontinence.  Genitourinary: Positive for pelvic pain. Negative for bladder incontinence.  Musculoskeletal: Positive for back pain and arthralgias. Negative for myalgias, joint swelling, gait problem, neck pain and neck stiffness.  Neurological: Positive for weakness. Negative for dizziness, tingling, light-headedness, numbness and headaches.  Psychiatric/Behavioral: Negative for suicidal ideas, hallucinations, behavioral problems, confusion, sleep disturbance, self-injury, dysphoric mood, decreased concentration and agitation. The patient is not nervous/anxious, does not have insomnia and is not hyperactive.  Social History  Substance Use Topics  . Smoking status: Former Smoker -- 1.00 packs/day for 45 years  . Smokeless tobacco: Former Systems developer    Quit date: 08/19/2014  . Alcohol Use: No     Comment: Rarely   Objective:   BP 122/70 mmHg  Pulse 88  Temp(Src) 98.4 F (36.9 C) (Oral)  Resp 16  Wt 181 lb (82.101 kg)  Physical Exam  Constitutional: She is  oriented to person, place, and time. She appears well-developed and well-nourished.  Cardiovascular: Normal rate and regular rhythm.   Pulmonary/Chest: Effort normal and breath sounds normal.  Musculoskeletal: Normal range of motion. She exhibits no edema.  Neurological: She is alert and oriented to person, place, and time.  Skin: Rash noted.  On face and arm.   Psychiatric: She has a normal mood and affect. Her behavior is normal. Judgment and thought content normal.      Assessment & Plan:        1. PTSD (post-traumatic stress disorder) Improved.  Will continue medication for 3 months.  Do not want them to taper currently.   - escitalopram (LEXAPRO) 10 MG tablet; Take 1 tablet (10 mg total) by mouth daily.  Dispense: 30 tablet; Refill: 1  2. Anxiety Improved.   3. Bilateral low back pain, with sciatica presence unspecified [place] with pain. Is improved. Will refer to PT.   - Ambulatory referral to Physical Therapy  4. Skin rash New problem.   - Ambulatory referral to Dermatology   Margarita Rana, MD  [facility]

## 2014-11-21 ENCOUNTER — Ambulatory Visit: Payer: Medicare Other | Admitting: Physical Therapy

## 2014-11-26 ENCOUNTER — Ambulatory Visit: Payer: Medicare Other | Attending: Family Medicine | Admitting: Physical Therapy

## 2014-11-26 ENCOUNTER — Ambulatory Visit: Payer: Medicare Other | Admitting: Family Medicine

## 2014-11-26 ENCOUNTER — Encounter: Payer: Self-pay | Admitting: Physical Therapy

## 2014-11-26 DIAGNOSIS — R262 Difficulty in walking, not elsewhere classified: Secondary | ICD-10-CM | POA: Insufficient documentation

## 2014-11-26 DIAGNOSIS — M79662 Pain in left lower leg: Secondary | ICD-10-CM | POA: Insufficient documentation

## 2014-11-26 DIAGNOSIS — R29898 Other symptoms and signs involving the musculoskeletal system: Secondary | ICD-10-CM

## 2014-11-26 DIAGNOSIS — M25652 Stiffness of left hip, not elsewhere classified: Secondary | ICD-10-CM | POA: Insufficient documentation

## 2014-11-26 DIAGNOSIS — M79661 Pain in right lower leg: Secondary | ICD-10-CM | POA: Insufficient documentation

## 2014-11-27 NOTE — Therapy (Signed)
[facility] [address] [place], [place], [address] Phone: [phone]   Fax:  [phone]  Physical Therapy Evaluation  Patient Details  Name: Kayla Maxwell MRN: [id number] Date of Birth: August 07, 1945 Referring Provider: Dr. Venia Minks  Encounter Date: 11/26/2014      PT End of Session - 11/26/14 1443    Visit Number 1   Number of Visits 4   Date for PT Re-Evaluation 12/24/14   Authorization - Visit Number 1   Authorization - Number of Visits 10   PT Start Time [id number]   PT Stop Time 1330   PT Time Calculation (min) 49 min   Activity Tolerance Patient tolerated treatment well;No increased pain   Behavior During Therapy Anxious;WFL for tasks assessed/performed      Past Medical History  Diagnosis Date  . Allergy     takes Singulair daily as needed  . Acid reflux     takes Nexium daily  . Hyperlipidemia     takes Pravastatin daily-started taking this 6 months ago  . Constipation     takes Colace daily as needed  . Headache     sinus   . TIA (transient ischemic attack)     showed up on an MRI but she never knew it  . Chronic back pain     stenosis  . Urinary frequency   . History of blood transfusion   . History of shingles   . Stroke ([place])     TIA'S X 2 --- one last yr and one this yr...lost her memory over 4-6 hrs.  . Sleep apnea   . Cancer [facility])     uterine cancer with removal  . Dysrhythmia     atrial fibrillation dx 04/2014  . Lumbar surgical wound fluid collection     Past Surgical History  Procedure Laterality Date  . Cesarean section  1968  . Appendectomy  1963  . Cholecystectomy  2014  . Tonsillectomy    . Breast surgery  1984    augementation  . Abdominal hysterectomy    . Colonoscopy    . Esophagogastroduodenoscopy    . Lumbar laminectomy with coflex 1 level Bilateral 07/04/2014    Procedure: Laminectomy and Foraminotomy - bilateral - Lumbar Four-Five with coflex ;  Surgeon: Karie Chimera, MD;   Location: [place] NEURO ORS;  Service: Neurosurgery;  Laterality: Bilateral;  . Lumbar wound debridement N/A 08/19/2014    Procedure: Exploration of Lumbar wound;  Surgeon: Karie Chimera, MD;  Location: [place] NEURO ORS;  Service: Neurosurgery;  Laterality: N/A;  . Hammer toe surgery Bilateral   . Back surgery  08/19/2014    Dr. Hal Neer ([place] [facility])    There were no vitals filed for this visit.  Visit Diagnosis:  Pain in both lower legs  Difficulty walking  Weakness of both legs  Stiffness of hip joint, left      Subjective Assessment - 11/26/14 1438    Subjective Pt reports pain in her back and L hip. Pt states her hip is sore in the morning and after standing and walking. Pt reports she has never done PT before and is anxious for today. Pt states she had back surgery on 07/04/14 and 08/19/14.    Limitations Standing;Walking;House hold activities   How long can you stand comfortably? < 2 mins   How long can you walk comfortably? 20 feet   Diagnostic tests MRI, XRAYS of back   Patient Stated Goals return to gardening/return to  grocery shopping without pain/able to perform pool maintenance   Currently in Pain? Yes   Pain Score 2    Pain Location Hip   Pain Orientation Left   Pain Onset More than a month ago   Pain Frequency Intermittent       OBJECTIVE: Manual: in R sidelying, STM to L glut med and piriformis. Palpable tenderness and trigger points noted. There ex: R sidelying resisted clamshells x 20 with green theraband. Good technique and proper pelvic neutral alignment noted. Thomas test position stretching 30 seconds x 4 each side. Posterior pelvic tilts x 15. Proper TrA contraction noted. (issued as HEP).          PT Education - 11/26/14 1443    Education provided Yes   Education Details Pt given clamshells, pelvic tilts and thomas test position stretching.    Person(s) Educated Patient   Methods Explanation;Demonstration;Verbal cues;Handout;Tactile cues   Comprehension  Returned demonstration;Verbalized understanding             PT Long Term Goals - 11/27/14 1126    PT LONG TERM GOAL #1   Title Pt will decrease ODI score to < 18% in order to return to prior level of function.   Time 4   Period Weeks   Status New   PT LONG TERM GOAL #2   Title Pt will be independent with HEP in order to improve strength by 1/2 MMT grade in order to return to gardening.   Time 4   Period Weeks   Status New   PT LONG TERM GOAL #3   Title Pt will report no increased pain over a 2/10 while grocery shopping in order to maintain her ADLs function.   Time 4   Period Weeks   Status New   PT LONG TERM GOAL #4   Title Pt will report decrease tenderness to L hip musculature with palption in order to return to walking program on a daily basis.   Time 4   Period Weeks   Status New               Plan - 11/26/14 1521    Clinical Impression Statement Pt is a pleasant 69 y.o. F referred to PT for low back pain and hip pain. DOS: 07/04/14 for initial Coflex placement and revision on 08/19/14. Pt reports mild pain at a 3/10 in L hip at rest. Pt denies any symptoms in her back. Pt's MMT: R LE 4+/5 and L LE 4/5 except L knee extension is a 3/5. Pt's AROM in lumbar is [facility] except for flexion at 75% with pulling sensation being pain limiting. Pt's has [facility] R lumbar rotation but has a pulling sensation on the L side of her low back with R rotation. Pt's hamstring flexibility is [facility] and reports no back symptoms with SLR. Pt has palpable tenderness along her L glut med and piriformis from  L4 to the hip. Trigger points noted and relief found with ischemic compression. (-) Ober test for ITB involvement on the L side. Pt's L hip AP joint mobility is hypomobile. With mobilizations, pt is able to gain more pain free motion in the posterior capsule. Pt has a (+) Thomas test on the L side and feels relief with stretching. Pt will benefit from short term skilled PT in order to regain prior  level of function.  ODI: 36% self-perceived moderate disability.   Pt will benefit from skilled therapeutic intervention in order to improve on the following deficits  Decreased activity tolerance;Difficulty walking;Hypomobility;Improper body mechanics;Postural dysfunction;Decreased strength;Decreased mobility;Pain;Decreased endurance;Abnormal gait   PT Frequency 1x / week   PT Duration 4 weeks   PT Treatment/Interventions ADLs/Self Care Home Management;Cryotherapy;Electrical Stimulation;Moist Heat;Balance training;Therapeutic exercise;Manual techniques;Therapeutic activities;Functional mobility training;Stair training;Gait training;Patient/family education;Neuromuscular re-education   PT Next Visit Plan progress HEP/incorporate more hip strengthening/hip mobilizations   PT Home Exercise Plan see handout   Consulted and Agree with Plan of Care Patient          G-Codes - 2014/12/14 1314    Functional Assessment Tool Used Oswestry/ clinical judgement/ pain/ strength   Functional Limitation Mobility: Walking and moving around   Mobility: Walking and Moving Around Current Status [phone]) At least 40 percent but less than 60 percent impaired, limited or restricted   Mobility: Walking and Moving Around Goal Status [phone]) At least 1 percent but less than 20 percent impaired, limited or restricted       Problem List Patient Active Problem List   Diagnosis Date Noted  . Anxiety 11/19/2014  . Contact dermatitis 10/29/2014  . PTSD (post-traumatic stress disorder) 10/29/2014  . Abnormal hemoglobin ([place]) 09/05/2014  . Acid reflux 09/05/2014  . Allergic rhinitis 09/05/2014  . HLD (hyperlipidemia) 09/05/2014  . Disc disorder 09/05/2014  . Mild memory disturbance 09/05/2014  . H/O stroke without residual deficits 09/05/2014  . Cystitis 09/05/2014  . Edema 09/05/2014  . Postoperative CSF leak 08/19/2014  . Spinal stenosis, lumbar 07/04/2014  . Atrial fibrillation, chronic ([place]) 05/08/2014  . MI  (mitral incompetence) 05/08/2014  . TI (tricuspid incompetence) 05/08/2014  . Compulsive tobacco user syndrome 04/29/2014  . Gastro-esophageal reflux disease without esophagitis 04/29/2014  . Obstructive apnea 04/29/2014  . Neuritis or radiculitis due to rupture of lumbar intervertebral disc 09/21/2013  . DDD (degenerative disc disease), lumbar 08/24/2013  . Bursitis, trochanteric 06/18/2013  . Nerve root inflammation 06/18/2013  . Arthralgia of hip 06/04/2013  . Calculus of gallbladder with other cholecystitis, without mention of obstruction 04/27/2012   Pura Spice, PT, DPT # [phone]   2014/12/14, 1:16 PM  [facility] [address] [place], [place], [address] Phone: [phone]   Fax:  [phone]  Name: Glender Ewin Baksh MRN: [id number] Date of Birth: [birth year]

## 2014-12-04 ENCOUNTER — Ambulatory Visit: Payer: Medicare Other | Admitting: Physical Therapy

## 2014-12-04 ENCOUNTER — Encounter: Payer: Self-pay | Admitting: Physical Therapy

## 2014-12-04 DIAGNOSIS — M79662 Pain in left lower leg: Principal | ICD-10-CM

## 2014-12-04 DIAGNOSIS — R29898 Other symptoms and signs involving the musculoskeletal system: Secondary | ICD-10-CM

## 2014-12-04 DIAGNOSIS — M79661 Pain in right lower leg: Secondary | ICD-10-CM

## 2014-12-04 DIAGNOSIS — M25652 Stiffness of left hip, not elsewhere classified: Secondary | ICD-10-CM

## 2014-12-04 DIAGNOSIS — R262 Difficulty in walking, not elsewhere classified: Secondary | ICD-10-CM

## 2014-12-04 NOTE — Therapy (Signed)
[facility] [address]. [place], [place], [address] Phone: [phone]   Fax:  [phone]  Physical Therapy Treatment  Patient Details  Name: Kayla Maxwell MRN: [id number] Date of Birth: 1945/07/02 Referring Provider: Dr. Venia Minks  Encounter Date: 12/04/2014      PT End of Session - 12/04/14 1512    Visit Number 2   Number of Visits 4   Date for PT Re-Evaluation 12/24/14   Authorization - Visit Number 2   Authorization - Number of Visits 10   PT Start Time [id number]   PT Stop Time [id number]   PT Time Calculation (min) 47 min   Activity Tolerance Patient tolerated treatment well;No increased pain   Behavior During Therapy [facility] for tasks assessed/performed      Past Medical History  Diagnosis Date  . Allergy     takes Singulair daily as needed  . Acid reflux     takes Nexium daily  . Hyperlipidemia     takes Pravastatin daily-started taking this 6 months ago  . Constipation     takes Colace daily as needed  . Headache     sinus   . TIA (transient ischemic attack)     showed up on an MRI but she never knew it  . Chronic back pain     stenosis  . Urinary frequency   . History of blood transfusion   . History of shingles   . Stroke ([place])     TIA'S X 2 --- one last yr and one this yr...lost her memory over 4-6 hrs.  . Sleep apnea   . Cancer [facility])     uterine cancer with removal  . Dysrhythmia     atrial fibrillation dx 04/2014  . Lumbar surgical wound fluid collection     Past Surgical History  Procedure Laterality Date  . Cesarean section  1968  . Appendectomy  1963  . Cholecystectomy  2014  . Tonsillectomy    . Breast surgery  1984    augementation  . Abdominal hysterectomy    . Colonoscopy    . Esophagogastroduodenoscopy    . Lumbar laminectomy with coflex 1 level Bilateral 07/04/2014    Procedure: Laminectomy and Foraminotomy - bilateral - Lumbar Four-Five with coflex ;  Surgeon: Karie Chimera, MD;  Location: [place]  NEURO ORS;  Service: Neurosurgery;  Laterality: Bilateral;  . Lumbar wound debridement N/A 08/19/2014    Procedure: Exploration of Lumbar wound;  Surgeon: Karie Chimera, MD;  Location: [place] NEURO ORS;  Service: Neurosurgery;  Laterality: N/A;  . Hammer toe surgery Bilateral   . Back surgery  08/19/2014    Dr. Hal Neer ([place] [facility])    There were no vitals filed for this visit.  Visit Diagnosis:  Pain in both lower legs  Difficulty walking  Weakness of both legs  Stiffness of hip joint, left      Subjective Assessment - 12/04/14 1510    Subjective Pt reports feeling good today and only minimal soreness in her L leg. Pt reports no back pain and feeling better overall after doing HEP.   Limitations Standing;Walking;House hold activities   How long can you stand comfortably? < 2 mins   How long can you walk comfortably? 20 feet   Diagnostic tests MRI, XRAYS of back   Patient Stated Goals return to gardening/return to grocery shopping without pain/able to perform pool maintenance   Currently in Pain? No/denies   Pain Onset More than a month  ago     OBJECTIVE: Manual: B LE stretching with concentration on L piriformis and distal hamstring in supine. Pt demonstrates good carryover of flexibility from last PT tx session to today. In R sidelying, STM to L piriformis and ITB. Palpable tenderness noted at ITB and piriformis insertion. Pt found relief with STM and ischemic compression. In R sidelying, Ober's testing position to stretch L ITB. There ex: Pt required verbal cues to maintain there ex with proper form and speed in order to receive the most benefit. Mini squats and lunges in the // bars with B UE support x 30. Airex balance with eyes open and eyes closed 30 seconds x 4 each. Airex toe raises/heel raises and marching x 30 seconds all. Supine posterior pelvic tilts with 5 second hold x 10. Proper TrA contraction noted. Supine marching with TrA contraction x 15. Supine bridging with TrA  contraction x 30. Good technique noted throughout and proper speed carryover after verbal cuing.  Pt response to tx for medical necessity: Pt benefits from [facility] and stretching to increase pain free ROM. Pt benefits from strengthening in order to maintain TrA contraction with all activity.          PT Long Term Goals - 11/27/14 1126    PT LONG TERM GOAL #1   Title Pt will decrease ODI score to < 18% in order to return to prior level of function.   Time 4   Period Weeks   Status New   PT LONG TERM GOAL #2   Title Pt will be independent with HEP in order to improve strength by 1/2 MMT grade in order to return to gardening.   Time 4   Period Weeks   Status New   PT LONG TERM GOAL #3   Title Pt will report no increased pain over a 2/10 while grocery shopping in order to maintain her ADLs function.   Time 4   Period Weeks   Status New   PT LONG TERM GOAL #4   Title Pt will report decrease tenderness to L hip musculature with palption in order to return to walking program on a daily basis.   Time 4   Period Weeks   Status New               Plan - 12/04/14 1513    Clinical Impression Statement Pt demonstrates and increase in L piriformis and ITB flexibility as noted by manual stretching. Pt Humes has pain at piriformis/ITB insertion that occasionally radiates down to L lateral knee. Pt finds relief with STM and ischemic pressure to insertional spot. Pt is tender to palpation along piriformis and ITB and finds relief with STM. Pt  has a positive response to seated sciatic nerve glides.    Pt will benefit from skilled therapeutic intervention in order to improve on the following deficits Decreased activity tolerance;Difficulty walking;Hypomobility;Improper body mechanics;Postural dysfunction;Decreased strength;Decreased mobility;Pain;Decreased endurance;Abnormal gait   Rehab Potential Good   PT Frequency 1x / week   PT Duration 4 weeks   PT Treatment/Interventions ADLs/Self Care  Home Management;Cryotherapy;Electrical Stimulation;Moist Heat;Balance training;Therapeutic exercise;Manual techniques;Therapeutic activities;Functional mobility training;Stair training;Gait training;Patient/family education;Neuromuscular re-education   PT Next Visit Plan progress HEP/incorporate more hip strengthening/hip mobilizations   PT Home Exercise Plan see handout   Consulted and Agree with Plan of Care Patient        Problem List Patient Active Problem List   Diagnosis Date Noted  . Anxiety 11/19/2014  . Contact dermatitis 10/29/2014  .  PTSD (post-traumatic stress disorder) 10/29/2014  . Abnormal hemoglobin ([place]) 09/05/2014  . Acid reflux 09/05/2014  . Allergic rhinitis 09/05/2014  . HLD (hyperlipidemia) 09/05/2014  . Disc disorder 09/05/2014  . Mild memory disturbance 09/05/2014  . H/O stroke without residual deficits 09/05/2014  . Cystitis 09/05/2014  . Edema 09/05/2014  . Postoperative CSF leak 08/19/2014  . Spinal stenosis, lumbar 07/04/2014  . Atrial fibrillation, chronic ([place]) 05/08/2014  . MI (mitral incompetence) 05/08/2014  . TI (tricuspid incompetence) 05/08/2014  . Compulsive tobacco user syndrome 04/29/2014  . Gastro-esophageal reflux disease without esophagitis 04/29/2014  . Obstructive apnea 04/29/2014  . Neuritis or radiculitis due to rupture of lumbar intervertebral disc 09/21/2013  . DDD (degenerative disc disease), lumbar 08/24/2013  . Bursitis, trochanteric 06/18/2013  . Nerve root inflammation 06/18/2013  . Arthralgia of hip 06/04/2013  . Calculus of gallbladder with other cholecystitis, without mention of obstruction 04/27/2012    Lavone Neri, SPT 12/04/2014, 3:17 PM  [facility] [address]. [place], [place], [address] Phone: [phone]   Fax:  [phone]  Name: Kayla Maxwell MRN: [id number] Date of Birth: [birth year]

## 2014-12-10 ENCOUNTER — Encounter: Payer: Medicare Other | Admitting: Physical Therapy

## 2014-12-17 ENCOUNTER — Encounter: Payer: Self-pay | Admitting: Physical Therapy

## 2014-12-17 ENCOUNTER — Encounter: Payer: Medicare Other | Admitting: Physical Therapy

## 2014-12-17 ENCOUNTER — Ambulatory Visit: Payer: Medicare Other | Attending: Family Medicine | Admitting: Physical Therapy

## 2014-12-17 DIAGNOSIS — R262 Difficulty in walking, not elsewhere classified: Secondary | ICD-10-CM | POA: Diagnosis present

## 2014-12-17 DIAGNOSIS — M25652 Stiffness of left hip, not elsewhere classified: Secondary | ICD-10-CM | POA: Insufficient documentation

## 2014-12-17 DIAGNOSIS — R29898 Other symptoms and signs involving the musculoskeletal system: Secondary | ICD-10-CM | POA: Diagnosis present

## 2014-12-17 DIAGNOSIS — M79662 Pain in left lower leg: Secondary | ICD-10-CM | POA: Diagnosis present

## 2014-12-17 DIAGNOSIS — M79661 Pain in right lower leg: Secondary | ICD-10-CM | POA: Insufficient documentation

## 2014-12-17 NOTE — Therapy (Signed)
[facility] [address] [place], [place], [address] Phone: [phone]   Fax:  [phone]  Physical Therapy Treatment  Patient Details  Name: Kayla Maxwell MRN: [id number] Date of Birth: 11/26/1945 Referring Provider: Dr. Venia Minks  Encounter Date: 12/17/2014      PT End of Session - 12/17/14 1558    Visit Number 3   Number of Visits 4   Date for PT Re-Evaluation 12/24/14   Authorization - Visit Number 3   Authorization - Number of Visits 10   PT Start Time [id number]   PT Stop Time 1510   PT Time Calculation (min) 29 min   Activity Tolerance Patient tolerated treatment well;Patient limited by pain   Behavior During Therapy [facility] for tasks assessed/performed      Past Medical History  Diagnosis Date  . Allergy     takes Singulair daily as needed  . Acid reflux     takes Nexium daily  . Hyperlipidemia     takes Pravastatin daily-started taking this 6 months ago  . Constipation     takes Colace daily as needed  . Headache     sinus   . TIA (transient ischemic attack)     showed up on an MRI but she never knew it  . Chronic back pain     stenosis  . Urinary frequency   . History of blood transfusion   . History of shingles   . Stroke ([place])     TIA'S X 2 --- one last yr and one this yr...lost her memory over 4-6 hrs.  . Sleep apnea   . Cancer [facility])     uterine cancer with removal  . Dysrhythmia     atrial fibrillation dx 04/2014  . Lumbar surgical wound fluid collection     Past Surgical History  Procedure Laterality Date  . Cesarean section  1968  . Appendectomy  1963  . Cholecystectomy  2014  . Tonsillectomy    . Breast surgery  1984    augementation  . Abdominal hysterectomy    . Colonoscopy    . Esophagogastroduodenoscopy    . Lumbar laminectomy with coflex 1 level Bilateral 07/04/2014    Procedure: Laminectomy and Foraminotomy - bilateral - Lumbar Four-Five with coflex ;  Surgeon: Karie Chimera, MD;   Location: [place] NEURO ORS;  Service: Neurosurgery;  Laterality: Bilateral;  . Lumbar wound debridement N/A 08/19/2014    Procedure: Exploration of Lumbar wound;  Surgeon: Karie Chimera, MD;  Location: [place] NEURO ORS;  Service: Neurosurgery;  Laterality: N/A;  . Hammer toe surgery Bilateral   . Back surgery  08/19/2014    Dr. Hal Neer ([place] [facility])    There were no vitals filed for this visit.  Visit Diagnosis:  Pain in both lower legs  Difficulty walking  Weakness of both legs  Stiffness of hip joint, left      Subjective Assessment - 12/17/14 1557    Subjective Pt reports waking up on Sunday with L sided grabbing pain in her back and buttock into the leg. Pt states it is getting better but is [place] tender.   Limitations Standing;Walking;House hold activities   How long can you stand comfortably? < 2 mins   How long can you walk comfortably? 20 feet   Diagnostic tests MRI, XRAYS of back   Patient Stated Goals return to gardening/return to grocery shopping without pain/able to perform pool maintenance   Currently in Pain? Yes   Pain  Score 4    Pain Location Back   Pain Orientation Lower   Pain Onset More than a month ago      OBJECTIVE: Manual: In R sidelying, STM to L piriformis. Mild discomfort noted. In supine L hip stretching/FABER/hamstring/pirformis/resisted abduction with no reports of familiar pain or symptoms. In R sidelying, STM to L glut med. Palpable tenderness and pt jumpy to insertion sites, trigger point in the middle of the muscle belly, and L hip bursa. Pt unable to tolerate pressure of STM.  Pt response to tx for medical necessity: Pt benefits from manual to decrease pain and improve functional mobility. Manual tx to decrease sensitivity to touch and inflammation and increase pain free L hip mobility.      PT Long Term Goals - 11/27/14 1126    PT LONG TERM GOAL #1   Title Pt will decrease ODI score to < 18% in order to return to prior level of function.   Time  4   Period Weeks   Status New   PT LONG TERM GOAL #2   Title Pt will be independent with HEP in order to improve strength by 1/2 MMT grade in order to return to gardening.   Time 4   Period Weeks   Status New   PT LONG TERM GOAL #3   Title Pt will report no increased pain over a 2/10 while grocery shopping in order to maintain her ADLs function.   Time 4   Period Weeks   Status New   PT LONG TERM GOAL #4   Title Pt will report decrease tenderness to L hip musculature with palption in order to return to walking program on a daily basis.   Time 4   Period Weeks   Status New            Plan - 12/17/14 1559    Clinical Impression Statement Pt has increased L glut med tenderness to touch. Pt is jumpy to L glut med trigger point in the muscle belly. Pt has palpable tenderness at insertion of glut med on hip and pelvis. Pt is point tender to L hip bursas. Pt is unable to tolerater soft tissue mobilization to glut med. Pt has (-) clamshell, FABER, and resisted abduction. Pt is (+) to straight leg raising in sidelying.    Pt will benefit from skilled therapeutic intervention in order to improve on the following deficits Decreased activity tolerance;Difficulty walking;Hypomobility;Improper body mechanics;Postural dysfunction;Decreased strength;Decreased mobility;Pain;Decreased endurance;Abnormal gait   Rehab Potential Good   PT Frequency 1x / week   PT Duration 4 weeks   PT Treatment/Interventions ADLs/Self Care Home Management;Cryotherapy;Electrical Stimulation;Moist Heat;Balance training;Therapeutic exercise;Manual techniques;Therapeutic activities;Functional mobility training;Stair training;Gait training;Patient/family education;Neuromuscular re-education   PT Next Visit Plan progress HEP/incorporate more hip strengthening/hip mobilizations.  Reassess goals/ check schedule next tx.   PT Home Exercise Plan continue current stretching/icing to decrease inflammation.   Consulted and Agree  with Plan of Care Patient        Problem List Patient Active Problem List   Diagnosis Date Noted  . Anxiety 11/19/2014  . Contact dermatitis 10/29/2014  . PTSD (post-traumatic stress disorder) 10/29/2014  . Abnormal hemoglobin ([place]) 09/05/2014  . Acid reflux 09/05/2014  . Allergic rhinitis 09/05/2014  . HLD (hyperlipidemia) 09/05/2014  . Disc disorder 09/05/2014  . Mild memory disturbance 09/05/2014  . H/O stroke without residual deficits 09/05/2014  . Cystitis 09/05/2014  . Edema 09/05/2014  . Postoperative CSF leak 08/19/2014  . Spinal stenosis, lumbar 07/04/2014  .  Atrial fibrillation, chronic ([place]) 05/08/2014  . MI (mitral incompetence) 05/08/2014  . TI (tricuspid incompetence) 05/08/2014  . Compulsive tobacco user syndrome 04/29/2014  . Gastro-esophageal reflux disease without esophagitis 04/29/2014  . Obstructive apnea 04/29/2014  . Neuritis or radiculitis due to rupture of lumbar intervertebral disc 09/21/2013  . DDD (degenerative disc disease), lumbar 08/24/2013  . Bursitis, trochanteric 06/18/2013  . Nerve root inflammation 06/18/2013  . Arthralgia of hip 06/04/2013  . Calculus of gallbladder with other cholecystitis, without mention of obstruction 04/27/2012   Pura Spice, PT, DPT # [phone]   12/18/2014, 11:10 AM  [place] [facility] [address]. [place], [place], [address] Phone: [phone]   Fax:  [phone]  Name: Kayla Maxwell MRN: [id number] Date of Birth: [birth year]

## 2014-12-24 ENCOUNTER — Ambulatory Visit: Payer: Medicare Other | Admitting: Physical Therapy

## 2014-12-24 DIAGNOSIS — M79662 Pain in left lower leg: Principal | ICD-10-CM

## 2014-12-24 DIAGNOSIS — R262 Difficulty in walking, not elsewhere classified: Secondary | ICD-10-CM

## 2014-12-24 DIAGNOSIS — M25652 Stiffness of left hip, not elsewhere classified: Secondary | ICD-10-CM

## 2014-12-24 DIAGNOSIS — R29898 Other symptoms and signs involving the musculoskeletal system: Secondary | ICD-10-CM

## 2014-12-24 DIAGNOSIS — M79661 Pain in right lower leg: Secondary | ICD-10-CM | POA: Diagnosis not present

## 2014-12-25 ENCOUNTER — Encounter: Payer: Self-pay | Admitting: Physical Therapy

## 2014-12-25 NOTE — Therapy (Signed)
[place] [facility] [address] [place], [place], [address] Phone: [phone]   Fax:  [phone]  Physical Therapy Treatment  Patient Details  Name: Kayla Maxwell MRN: [id number] Date of Birth: 19-May-1945 Referring Provider: Dr. Venia Minks  Encounter Date: 12/24/2014      PT End of Session - 12/25/14 0952    Visit Number 3   Number of Visits 4   Date for PT Re-Evaluation 12/24/14   Authorization - Visit Number 3   Authorization - Number of Visits 10   PT Start Time [id number]   PT Stop Time 1430   PT Time Calculation (min) 46 min   Activity Tolerance Patient tolerated treatment well;Patient limited by pain   Behavior During Therapy [facility] for tasks assessed/performed;Anxious      Past Medical History  Diagnosis Date  . Allergy     takes Singulair daily as needed  . Acid reflux     takes Nexium daily  . Hyperlipidemia     takes Pravastatin daily-started taking this 6 months ago  . Constipation     takes Colace daily as needed  . Headache     sinus   . TIA (transient ischemic attack)     showed up on an MRI but she never knew it  . Chronic back pain     stenosis  . Urinary frequency   . History of blood transfusion   . History of shingles   . Stroke ([place])     TIA'S X 2 --- one last yr and one this yr...lost her memory over 4-6 hrs.  . Sleep apnea   . Cancer [facility])     uterine cancer with removal  . Dysrhythmia     atrial fibrillation dx 04/2014  . Lumbar surgical wound fluid collection     Past Surgical History  Procedure Laterality Date  . Cesarean section  1968  . Appendectomy  1963  . Cholecystectomy  2014  . Tonsillectomy    . Breast surgery  1984    augementation  . Abdominal hysterectomy    . Colonoscopy    . Esophagogastroduodenoscopy    . Lumbar laminectomy with coflex 1 level Bilateral 07/04/2014    Procedure: Laminectomy and Foraminotomy - bilateral - Lumbar Four-Five with coflex ;  Surgeon: Karie Chimera, MD;   Location: [place] NEURO ORS;  Service: Neurosurgery;  Laterality: Bilateral;  . Lumbar wound debridement N/A 08/19/2014    Procedure: Exploration of Lumbar wound;  Surgeon: Karie Chimera, MD;  Location: [place] NEURO ORS;  Service: Neurosurgery;  Laterality: N/A;  . Hammer toe surgery Bilateral   . Back surgery  08/19/2014    Dr. Hal Neer ([place] [facility])    There were no vitals filed for this visit.  Visit Diagnosis:  Pain in both lower legs  Difficulty walking  Weakness of both legs  Stiffness of hip joint, left      Subjective Assessment - 12/25/14 0949    Subjective Pt reports an increase in activity level on Friday and Saturday that lead to an increase in pain in her L side of lumbar and L hip/buttock region.   Limitations Standing;Walking;House hold activities   How long can you stand comfortably? < 2 mins   How long can you walk comfortably? 20 feet   Diagnostic tests MRI, XRAYS of back   Patient Stated Goals return to gardening/return to grocery shopping without pain/able to perform pool maintenance   Currently in Pain? Yes   Pain Score  6    Pain Location Back   Pain Orientation Left;Lower   Pain Onset More than a month ago      OBJECTIVE: Manual: In R sidelying, STM to L glut med/piriformis/ITB. Palpable tenderness and trigger points noted in L glut med. Stretching to L ITB with no reproduction of symptoms. In prone, Central PAs to L4-S1 grade II 30 seconds x 3. There ex: In sidelying, resisted clamshells with green TB, supine adduction squeezes, supine bolster bridging x 30 each. All with ESTIM on. Pt on modulation mode in piriformis/glut set up with 4 electrodes (2 lumbar spine, 1 L gluteal region, 1 left proximal ITB) for 20 minutes at 15 mA. No irritation to skin noted. Pt found good pain relief from the machien.  Pt response to tx for medical necessity: Pt benefits from core strengthening and progressive resistance training in order to improve functional mobility. Pt benefits  from manual to increase pain free ROM. At this time, per pt's request, pt is discharged to independent HEP.          PT Education - 12/25/14 0951    Education provided Yes   Education Details Pt given ball squeezes in supine/clamshells and bridging to increase glut med strength.   Person(s) Educated Patient   Methods Explanation;Demonstration;Handout;Verbal cues   Comprehension Verbalized understanding;Returned demonstration             PT Long Term Goals - 12/25/14 0955    PT LONG TERM GOAL #1   Title Pt will decrease ODI score to < 18% in order to return to prior level of function.   Time 4   Period Weeks   Status Not Met   PT LONG TERM GOAL #2   Title Pt will be independent with HEP in order to improve strength by 1/2 MMT grade in order to return to gardening.   Time 4   Period Weeks   Status Partially Met   PT LONG TERM GOAL #3   Title Pt will report no increased pain over a 2/10 while grocery shopping in order to maintain her ADLs function.   Time 4   Period Weeks   Status Not Met   PT LONG TERM GOAL #4   Title Pt will report decrease tenderness to L hip musculature with palption in order to return to walking program on a daily basis.   Time 4   Period Weeks   Status Not Met            Plan - 12/25/14 1610    Clinical Impression Statement Pt demonstrates increased L glut med tenderness and finds releif with TENS unit on L side lumbar spine and L LE.  Pt. is not interested in use of home TENS for pain control.  Pt. educated on [place] but not interested in participating at this time and will use her own pool next summer. Pt is negative for a trendelenburg gait pattern. Pt is hypomobile at L4-L5 and L5-S1. Pt is hypersensitive along pifiromis and IT band insertion to L hip. Pt has palable tenderness in her L glut med. Pt performs resisted clamshells without pain and hip abduction in a painfree range.  At this time, per pt's request, pt is discharged from  skilled PT.    Pt will benefit from skilled therapeutic intervention in order to improve on the following deficits Decreased activity tolerance;Difficulty walking;Hypomobility;Improper body mechanics;Postural dysfunction;Decreased strength;Decreased mobility;Pain;Decreased endurance;Abnormal gait   Rehab Potential Good   PT Frequency 1x / week  PT Duration 4 weeks   PT Treatment/Interventions ADLs/Self Care Home Management;Cryotherapy;Electrical Stimulation;Moist Heat;Balance training;Therapeutic exercise;Manual techniques;Therapeutic activities;Functional mobility training;Stair training;Gait training;Patient/family education;Neuromuscular re-education   PT Next Visit Plan Discharge from PT at this time.  Pt. remains pain focused/ limited with PT tx. session.     PT Home Exercise Plan see handout. continue as independent HEP   Consulted and Agree with Plan of Care Patient          G-Codes - 01-08-15 1349    Functional Assessment Tool Used Oswestry/ clinical judgement/ pain/ strength   Functional Limitation Mobility: Walking and moving around   Mobility: Walking and Moving Around Current Status [phone]) At least 20 percent but less than 40 percent impaired, limited or restricted   Mobility: Walking and Moving Around Goal Status [phone]) At least 1 percent but less than 20 percent impaired, limited or restricted   Mobility: Walking and Moving Around Discharge Status [phone]) At least 20 percent but less than 40 percent impaired, limited or restricted      Problem List Patient Active Problem List   Diagnosis Date Noted  . Anxiety 11/19/2014  . Contact dermatitis 10/29/2014  . PTSD (post-traumatic stress disorder) 10/29/2014  . Abnormal hemoglobin ([place]) 09/05/2014  . Acid reflux 09/05/2014  . Allergic rhinitis 09/05/2014  . HLD (hyperlipidemia) 09/05/2014  . Disc disorder 09/05/2014  . Mild memory disturbance 09/05/2014  . H/O stroke without residual deficits 09/05/2014  . Cystitis  09/05/2014  . Edema 09/05/2014  . Postoperative CSF leak 08/19/2014  . Spinal stenosis, lumbar 07/04/2014  . Atrial fibrillation, chronic ([place]) 05/08/2014  . MI (mitral incompetence) 05/08/2014  . TI (tricuspid incompetence) 05/08/2014  . Compulsive tobacco user syndrome 04/29/2014  . Gastro-esophageal reflux disease without esophagitis 04/29/2014  . Obstructive apnea 04/29/2014  . Neuritis or radiculitis due to rupture of lumbar intervertebral disc 09/21/2013  . DDD (degenerative disc disease), lumbar 08/24/2013  . Bursitis, trochanteric 06/18/2013  . Nerve root inflammation 06/18/2013  . Arthralgia of hip 06/04/2013  . Calculus of gallbladder with other cholecystitis, without mention of obstruction 04/27/2012   Pura Spice, PT, DPT # [phone]   12/25/2014, 1:50 PM  [place] [facility] [address]. [place], [place], [address] Phone: [phone]   Fax:  [phone]  Name: Kayla Maxwell MRN: [id number] Date of Birth: [birth year]

## 2014-12-31 ENCOUNTER — Encounter: Payer: Self-pay | Admitting: Family Medicine

## 2014-12-31 ENCOUNTER — Ambulatory Visit (INDEPENDENT_AMBULATORY_CARE_PROVIDER_SITE_OTHER): Payer: Medicare Other | Admitting: Family Medicine

## 2014-12-31 VITALS — BP 128/84 | HR 100 | Temp 98.4°F | Resp 16 | Wt 185.0 lb

## 2014-12-31 DIAGNOSIS — J069 Acute upper respiratory infection, unspecified: Secondary | ICD-10-CM | POA: Diagnosis not present

## 2014-12-31 MED ORDER — FLUTICASONE PROPIONATE 50 MCG/ACT NA SUSP: 2.0000 | Freq: Every day | NASAL | Status: DC

## 2014-12-31 NOTE — Progress Notes (Signed)
Subjective:    Patient ID: Kayla Maxwell, female    DOB: March 12, 1945, 69 y.o.   MRN: [id number]  URI  This is a new problem. The current episode started 1 to 4 weeks ago (x 9 days). The problem has been gradually worsening. Associated symptoms include congestion, coughing (dry and productive of clear sputum), headaches, a plugged ear sensation, rhinorrhea, sinus pain, sneezing and a sore throat. Pertinent negatives include no abdominal pain, chest pain, diarrhea, dysuria, ear pain, nausea, swollen glands, vomiting or wheezing. She has tried antihistamine for the symptoms. The treatment provided no relief.      Review of Systems  HENT: Positive for congestion, rhinorrhea, sneezing and sore throat. Negative for ear pain.   Respiratory: Positive for cough (dry and productive of clear sputum). Negative for wheezing.   Cardiovascular: Negative for chest pain.  Gastrointestinal: Negative for nausea, vomiting, abdominal pain and diarrhea.  Genitourinary: Negative for dysuria.  Neurological: Positive for headaches.   BP 128/84 mmHg  Pulse 100  Temp(Src) 98.4 F (36.9 C) (Oral)  Resp 16  Wt 185 lb (83.915 kg)  SpO2 94%   Patient Active Problem List   Diagnosis Date Noted  . Anxiety 11/19/2014  . Contact dermatitis 10/29/2014  . PTSD (post-traumatic stress disorder) 10/29/2014  . Abnormal hemoglobin ([place]) 09/05/2014  . Acid reflux 09/05/2014  . Allergic rhinitis 09/05/2014  . HLD (hyperlipidemia) 09/05/2014  . Disc disorder 09/05/2014  . Mild memory disturbance 09/05/2014  . H/O stroke without residual deficits 09/05/2014  . Cystitis 09/05/2014  . Edema 09/05/2014  . Postoperative CSF leak 08/19/2014  . Spinal stenosis, lumbar 07/04/2014  . Atrial fibrillation, chronic ([place]) 05/08/2014  . MI (mitral incompetence) 05/08/2014  . TI (tricuspid incompetence) 05/08/2014  . Chronic atrial fibrillation ([place]) 05/08/2014  . Compulsive tobacco user syndrome 04/29/2014  .  Gastro-esophageal reflux disease without esophagitis 04/29/2014  . Obstructive apnea 04/29/2014  . Atrial fibrillation by electrocardiography ([place]) 04/29/2014  . Neuritis or radiculitis due to rupture of lumbar intervertebral disc 09/21/2013  . DDD (degenerative disc disease), lumbar 08/24/2013  . Bursitis, trochanteric 06/18/2013  . Nerve root inflammation 06/18/2013  . Arthralgia of hip 06/04/2013  . Calculus of gallbladder with other cholecystitis, without mention of obstruction 04/27/2012   Past Medical History  Diagnosis Date  . Allergy     takes Singulair daily as needed  . Acid reflux     takes Nexium daily  . Hyperlipidemia     takes Pravastatin daily-started taking this 6 months ago  . Constipation     takes Colace daily as needed  . Headache     sinus   . TIA (transient ischemic attack)     showed up on an MRI but she never knew it  . Chronic back pain     stenosis  . Urinary frequency   . History of blood transfusion   . History of shingles   . Stroke ([place])     TIA'S X 2 --- one last yr and one this yr...lost her memory over 4-6 hrs.  . Sleep apnea   . Cancer [facility])     uterine cancer with removal  . Dysrhythmia     atrial fibrillation dx 04/2014  . Lumbar surgical wound fluid collection    Current Outpatient Prescriptions on File Prior to Visit  Medication Sig  . acetaminophen (TYLENOL) 500 MG tablet Take 1,500 mg by mouth every 8 (eight) hours as needed for mild pain.  Marland Kitchen escitalopram (LEXAPRO) 10  MG tablet Take 1 tablet (10 mg total) by mouth daily.  Marland Kitchen esomeprazole (NEXIUM) 40 MG capsule Take 1 capsule (40 mg total) by mouth daily.  . metoprolol tartrate (LOPRESSOR) 25 MG tablet Take 25 mg by mouth 2 (two) times daily.  . Multiple Vitamin (MULTIVITAMIN) capsule Take 1 capsule by mouth daily.   . pravastatin (PRAVACHOL) 40 MG tablet take 1 tablet by mouth at bedtime  . Probiotic Product (PROBIOTIC ADVANCED PO) Take by mouth.  . rivaroxaban (XARELTO) 20 MG TABS  tablet Take 20 mg by mouth daily with supper.  . cetirizine (ZYRTEC) 10 MG tablet Take 10 mg by mouth daily. Reported on 12/31/2014  . Elastic Bandages & Supports (MEDICAL COMPRESSION SOCKS) MISC Knee high, to be worn daily. (Patient not taking: Reported on 12/31/2014)   No current facility-administered medications on file prior to visit.   Allergies  Allergen Reactions  . Codeine Other (See Comments)    Makes her feel "crazy"  . Penicillins Swelling  . Sulfa Antibiotics Nausea Only and Rash   Past Surgical History  Procedure Laterality Date  . Cesarean section  1968  . Appendectomy  1963  . Cholecystectomy  2014  . Tonsillectomy    . Breast surgery  1984    augementation  . Abdominal hysterectomy    . Colonoscopy    . Esophagogastroduodenoscopy    . Lumbar laminectomy with coflex 1 level Bilateral 07/04/2014    Procedure: Laminectomy and Foraminotomy - bilateral - Lumbar Four-Five with coflex ;  Surgeon: Karie Chimera, MD;  Location: [place] [facility];  Service: Neurosurgery;  Laterality: Bilateral;  . Lumbar wound debridement N/A 08/19/2014    Procedure: Exploration of Lumbar wound;  Surgeon: Karie Chimera, MD;  Location: [place] [facility];  Service: Neurosurgery;  Laterality: N/A;  . Hammer toe surgery Bilateral   . Back surgery  08/19/2014    Dr. Hal Neer ([facility])   Social History   Social History  . Marital Status: Divorced    Spouse Name: N/A  . Number of Children: 1  . Years of Education: College   Occupational History  . Retired    Social History Main Topics  . Smoking status: Former Smoker -- 1.00 packs/day for 45 years  . Smokeless tobacco: Former Systems developer    Quit date: 08/19/2014  . Alcohol Use: No     Comment: Rarely  . Drug Use: No  . Sexual Activity: Not on file   Other Topics Concern  . Not on file   Social History Narrative   Family History  Problem Relation Age of Onset  . Alzheimer's disease Mother   . Heart attack Father   . Pancreatic cancer  Sister   . Healthy Brother      .result     Objective:   Physical Exam  Constitutional: She appears well-developed and well-nourished.  HENT:  Head: Normocephalic and atraumatic.  Right Ear: Tympanic membrane normal.  Left Ear: Tympanic membrane normal.  Nose: Nose normal.  Mouth/Throat: Oropharynx is clear and moist.  Pulmonary/Chest: Effort normal and breath sounds normal. No respiratory distress. She has no wheezes. She has no rales.  Psychiatric: She has a normal mood and affect. Her behavior is normal.       Assessment & Plan:  1. Upper respiratory infection Start Flonase. Rx sent into pharmacy. Call if sx worsen, i.e start running fevers, etc. and will start antibiotic.     Patient seen and examined by Jerrell Belfast, MD, and note scribed by  Renaldo Fiddler, CMA.  I have reviewed the document for accuracy and completeness and I agree with above. Jerrell Belfast, MD   Margarita Rana, MD

## 2014-12-31 NOTE — Patient Instructions (Signed)
Call office at [phone] if you start running fevers.

## 2015-01-23 ENCOUNTER — Ambulatory Visit: Payer: Medicare Other | Admitting: Family Medicine

## 2015-01-23 ENCOUNTER — Encounter: Payer: Self-pay | Admitting: Family Medicine

## 2015-01-23 ENCOUNTER — Ambulatory Visit (INDEPENDENT_AMBULATORY_CARE_PROVIDER_SITE_OTHER): Payer: Medicare Other | Admitting: Family Medicine

## 2015-01-23 VITALS — BP 120/76 | HR 84 | Temp 98.6°F | Resp 16 | Ht 64.0 in | Wt 189.0 lb

## 2015-01-23 DIAGNOSIS — G4733 Obstructive sleep apnea (adult) (pediatric): Secondary | ICD-10-CM

## 2015-01-23 DIAGNOSIS — J3089 Other allergic rhinitis: Secondary | ICD-10-CM | POA: Diagnosis not present

## 2015-01-23 MED ORDER — MONTELUKAST SODIUM 10 MG PO TABS: 10.0000 mg | ORAL_TABLET | Freq: Every day | ORAL | Status: DC

## 2015-01-23 NOTE — Progress Notes (Signed)
Patient ID: Kayla Maxwell, female   DOB: 09-Mar-1945, 70 y.o.   MRN: [id number]       Patient: Kayla Maxwell Female    DOB: Jun 14, 1945   70 y.o.   MRN: [id number] Visit Date: 01/23/2015  Today's Provider: Margarita Rana, MD   Chief Complaint  Patient presents with  . Sleep Apnea   Subjective:    HPI Sleep Apnea: Patient here to follow-up on obstructive sleep apnea. Patient generally gets 5 or 6 hours of sleep per night, and states they generally have generally restful sleep. Snoring of no severity is not present. Patient uses CPAP 9 cm of pressure with heat and humidity. Patient reports she uses nightly. Patient reports improvement of symptoms. Tolerating it well. Very compliant.   Does have some nasal drainage and some eye drainage that she is concerned about. Does take her medication regularly.      Allergies  Allergen Reactions  . Codeine Other (See Comments)    Makes her feel "crazy"  . Penicillins Swelling  . Sulfa Antibiotics Nausea Only and Rash   Previous Medications   ACETAMINOPHEN (TYLENOL) 500 MG TABLET    Take 1,500 mg by mouth every 8 (eight) hours as needed for mild pain.   CETIRIZINE (ZYRTEC) 10 MG TABLET    Take 10 mg by mouth 2 (two) times daily. Reported on 12/31/2014   ESOMEPRAZOLE (NEXIUM) 40 MG CAPSULE    Take 1 capsule (40 mg total) by mouth daily.   FLUTICASONE (FLONASE) 50 MCG/ACT NASAL SPRAY    Place 2 sprays into both nostrils daily.   METOPROLOL TARTRATE (LOPRESSOR) 25 MG TABLET    Take 25 mg by mouth 2 (two) times daily.   MULTIPLE VITAMIN (MULTIVITAMIN) CAPSULE    Take 1 capsule by mouth daily.    PRAVASTATIN (PRAVACHOL) 40 MG TABLET    take 1 tablet by mouth at bedtime   PROBIOTIC PRODUCT (PROBIOTIC ADVANCED PO)    Take by mouth.   RIVAROXABAN (XARELTO) 20 MG TABS TABLET    Take 20 mg by mouth daily with supper.    Review of Systems  Constitutional: Negative.   Respiratory: Negative.     Social History  Substance Use Topics  . Smoking status:  Former Smoker -- 1.00 packs/day for 45 years    Quit date: 08/19/2014  . Smokeless tobacco: Former Systems developer    Quit date: 08/19/2014  . Alcohol Use: No     Comment: Rarely   Objective:   BP 120/76 mmHg  Pulse 84  Temp(Src) 98.6 F (37 C) (Oral)  Resp 16  Ht 5\' 4"  (1.626 m)  Wt 189 lb (85.73 kg)  BMI 32.43 kg/m2  SpO2 97%  Physical Exam  Constitutional: She is oriented to person, place, and time. She appears well-developed and well-nourished. No distress.  Cardiovascular: Normal rate and regular rhythm.   Pulmonary/Chest: Effort normal and breath sounds normal.  Neurological: She is alert and oriented to person, place, and time.  Psychiatric: She has a normal mood and affect. Her behavior is normal. Judgment and thought content normal.      Assessment & Plan:     1. Obstructive apnea Stable. Improved. Continue CPAP at 9 cm. Ordered new mask etc today.   2. Other allergic rhinitis Not quite at goal. Kraszewski with drainage. Will add Singulair. May need referral to allergist if does not improve.  - montelukast (SINGULAIR) 10 MG tablet; Take 1 tablet (10 mg total) by mouth at bedtime.  Dispense: 30  tablet; Refill: 3      Patient was seen and examined by Jerrell Belfast, MD, and note scribed by Lynford Humphrey, [place].   I have reviewed the document for accuracy and completeness and I agree with above. - Jerrell Belfast, MD  Margarita Rana, MD  [facility]

## 2015-01-28 DIAGNOSIS — L218 Other seborrheic dermatitis: Secondary | ICD-10-CM | POA: Diagnosis not present

## 2015-02-28 ENCOUNTER — Other Ambulatory Visit: Payer: Self-pay | Admitting: Family Medicine

## 2015-02-28 DIAGNOSIS — E785 Hyperlipidemia, unspecified: Secondary | ICD-10-CM

## 2015-03-24 ENCOUNTER — Telehealth: Payer: Self-pay | Admitting: Family Medicine

## 2015-03-24 DIAGNOSIS — M5136 Other intervertebral disc degeneration, lumbar region: Secondary | ICD-10-CM

## 2015-03-24 NOTE — Telephone Encounter (Signed)
Patient feels that Dr. Carloyn Manner can give her a second opinion about her back pain. She reports that he is located in [place] and reports that she talked with you about this before. Patient thinks that he could run more tests and get more xrays to fix what her other doctor in [place] has "messed up" in her back.

## 2015-03-24 NOTE — Telephone Encounter (Signed)
Pt states she is now ready to have an appointment made with Dr Carloyn Manner.  CC [id number]

## 2015-03-24 NOTE — Telephone Encounter (Signed)
Kayla Maxwell- Can you make a Monday appointment. Also, he would not see her previously, but let him know that her problem has been fixed. Thanks.

## 2015-03-24 NOTE — Telephone Encounter (Signed)
Why does she need this appointment?  Would not think surgeon would  Be a good idea. Please clarify. Thanks.

## 2015-03-24 NOTE — Telephone Encounter (Signed)
Patient reports that she has been to Dr. Sharlet Salina and he did 3 epidural injections in her back, and reports that it did not help. Patient prefers to see Dr. Carloyn Manner instead.

## 2015-03-24 NOTE — Telephone Encounter (Signed)
Would really think she should consider Dr. Sharlet Salina.  Thanks.

## 2015-03-28 ENCOUNTER — Other Ambulatory Visit: Payer: Self-pay

## 2015-03-28 DIAGNOSIS — K219 Gastro-esophageal reflux disease without esophagitis: Secondary | ICD-10-CM

## 2015-03-28 MED ORDER — PANTOPRAZOLE SODIUM 40 MG PO TBEC
40.0000 mg | DELAYED_RELEASE_TABLET | Freq: Every day | ORAL | Status: DC
Start: 2015-03-28 — End: 2015-06-30

## 2015-03-28 NOTE — Telephone Encounter (Signed)
Changed Nexium prescription to Protonix due to change in pt's preferred drug list per verbal order from Dr. Venia Minks. Renaldo Fiddler, CMA

## 2015-04-07 DIAGNOSIS — I071 Rheumatic tricuspid insufficiency: Secondary | ICD-10-CM | POA: Diagnosis not present

## 2015-04-07 DIAGNOSIS — I4891 Unspecified atrial fibrillation: Secondary | ICD-10-CM | POA: Diagnosis not present

## 2015-04-07 DIAGNOSIS — F17219 Nicotine dependence, cigarettes, with unspecified nicotine-induced disorders: Secondary | ICD-10-CM | POA: Diagnosis not present

## 2015-04-07 DIAGNOSIS — R0602 Shortness of breath: Secondary | ICD-10-CM | POA: Diagnosis not present

## 2015-04-07 DIAGNOSIS — I340 Nonrheumatic mitral (valve) insufficiency: Secondary | ICD-10-CM | POA: Diagnosis not present

## 2015-04-07 DIAGNOSIS — G4733 Obstructive sleep apnea (adult) (pediatric): Secondary | ICD-10-CM | POA: Diagnosis not present

## 2015-04-07 DIAGNOSIS — E782 Mixed hyperlipidemia: Secondary | ICD-10-CM | POA: Diagnosis not present

## 2015-04-07 DIAGNOSIS — I482 Chronic atrial fibrillation: Secondary | ICD-10-CM | POA: Diagnosis not present

## 2015-04-07 DIAGNOSIS — K219 Gastro-esophageal reflux disease without esophagitis: Secondary | ICD-10-CM | POA: Diagnosis not present

## 2015-04-11 ENCOUNTER — Encounter: Payer: Self-pay | Admitting: Family Medicine

## 2015-04-11 ENCOUNTER — Ambulatory Visit (INDEPENDENT_AMBULATORY_CARE_PROVIDER_SITE_OTHER): Payer: Medicare Other | Admitting: Family Medicine

## 2015-04-11 VITALS — BP 124/82 | HR 88 | Temp 97.5°F | Resp 16 | Wt 201.0 lb

## 2015-04-11 DIAGNOSIS — H9201 Otalgia, right ear: Secondary | ICD-10-CM | POA: Diagnosis not present

## 2015-04-11 DIAGNOSIS — R7309 Other abnormal glucose: Secondary | ICD-10-CM | POA: Diagnosis not present

## 2015-04-11 LAB — POCT GLYCOSYLATED HEMOGLOBIN (HGB A1C): Hemoglobin A1C: 5.8

## 2015-04-11 NOTE — Progress Notes (Signed)
Patient ID: Kayla Maxwell, female   DOB: Dec 18, 1945, 70 y.o.   MRN: [id number]         Patient: Kayla Maxwell Female    DOB: March 25, 1945   71 y.o.   MRN: [id number] Visit Date: 04/11/2015  Today's Provider: Margarita Rana, MD   Chief Complaint  Patient presents with  . Hyperglycemia   Subjective:    Otalgia  There is pain in the right ear. This is a chronic problem. The current episode started more than 1 month ago. The problem has been unchanged. There has been no fever. The pain is mild (Pt reports occasional sharpe right ear pain. ). Pertinent negatives include no coughing, ear discharge, headaches, neck pain, rhinorrhea, sore throat or vomiting. There is no history of a chronic ear infection, hearing loss or a tympanostomy tube.      Hyperglycemia, Follow-up:   Lab Results  Component Value Date   GLUCOSE 117* 08/28/2014   GLUCOSE 139* 08/25/2014   GLUCOSE 153* 08/25/2014    Current symptoms include none and have been stable.  Weight trend:  Slightly increased.   Current diet: in general, an "unhealthy" diet Current exercise: none  Pt reports not able to exercise secondary to back pain.    Pertinent Labs:    Component Value Date/Time   CHOL 201* 11/09/2013 1155   TRIG 195 11/09/2013 1155   CREATININE 1.02* 08/28/2014 0535   CREATININE 0.73 11/10/2013 0431    Wt Readings from Last 3 Encounters:  04/11/15 201 lb (91.173 kg)  01/23/15 189 lb (85.73 kg)  12/31/14 185 lb (83.915 kg)         Allergies  Allergen Reactions  . Codeine Other (See Comments)    Makes her feel "crazy"  . Penicillins Swelling  . Sulfa Antibiotics Nausea Only and Rash   Previous Medications   ACETAMINOPHEN (TYLENOL) 500 MG TABLET    Take 1,500 mg by mouth every 8 (eight) hours as needed for mild pain.   CETIRIZINE (ZYRTEC) 10 MG TABLET    Take 10 mg by mouth 2 (two) times daily. Reported on 12/31/2014   FLUTICASONE (FLONASE) 50 MCG/ACT NASAL SPRAY    Place 2 sprays into both  nostrils daily.   METOPROLOL TARTRATE (LOPRESSOR) 25 MG TABLET    Take 25 mg by mouth 2 (two) times daily.   MONTELUKAST (SINGULAIR) 10 MG TABLET    Take 1 tablet (10 mg total) by mouth at bedtime.   MULTIPLE VITAMIN (MULTIVITAMIN) CAPSULE    Take 1 capsule by mouth daily.    PANTOPRAZOLE (PROTONIX) 40 MG TABLET    Take 1 tablet (40 mg total) by mouth daily.   PRAVASTATIN (PRAVACHOL) 40 MG TABLET    take 1 tablet by mouth at bedtime   PROBIOTIC PRODUCT (PROBIOTIC ADVANCED PO)    Take by mouth.   RIVAROXABAN (XARELTO) 20 MG TABS TABLET    Take 20 mg by mouth daily with supper.    Review of Systems  Constitutional: Negative.   HENT: Positive for ear pain. Negative for ear discharge, postnasal drip, rhinorrhea, sore throat, tinnitus, trouble swallowing and voice change.   Respiratory: Positive for shortness of breath (Chronic issue ). Negative for apnea, cough, choking, chest tightness, wheezing and stridor.   Cardiovascular: Negative.   Gastrointestinal: Negative.  Negative for vomiting.  Endocrine: Negative.   Musculoskeletal: Positive for back pain (Chronic issue). Negative for myalgias, joint swelling, arthralgias, gait problem, neck pain and neck stiffness.  Neurological: Negative for dizziness, light-headedness  and headaches.    Social History  Substance Use Topics  . Smoking status: Former Smoker -- 1.00 packs/day for 45 years    Quit date: 08/19/2014  . Smokeless tobacco: Former Systems developer    Quit date: 08/19/2014  . Alcohol Use: No     Comment: Rarely   Objective:   BP 124/82 mmHg  Pulse 88  Temp(Src) 97.5 F (36.4 C) (Oral)  Resp 16  Wt 201 lb (91.173 kg)  Physical Exam  Constitutional: She is oriented to person, place, and time. She appears well-developed and well-nourished.  HENT:  Head: Normocephalic and atraumatic.  Right Ear: External ear normal.  Left Ear: External ear normal.  Some cerumen in right ear and more in left ear.    Neck: Normal range of motion. Neck  supple.  Cardiovascular: Normal rate and regular rhythm.   Pulmonary/Chest: Effort normal and breath sounds normal.  Neurological: She is alert and oriented to person, place, and time.  Psychiatric: She has a normal mood and affect. Her behavior is normal. Judgment and thought content normal.      Assessment & Plan:     1. Abnormal blood sugar New problem Upper limits of normal.  No need for further work up.   - POCT glycosylated hemoglobin (Hb A1C)  2. Ear pain, right New problem. Suspect related to cerumen.  Will use debrox bid and ov if does not improve.      Patient was seen and examined by Jerrell Belfast, MD, and note scribed by Ashley Royalty, CMA.  I have reviewed the document for accuracy and completeness and I agree with above. - Jerrell Belfast, MD   Margarita Rana, MD  [facility]

## 2015-04-18 ENCOUNTER — Encounter: Payer: Self-pay | Admitting: Family Medicine

## 2015-05-12 ENCOUNTER — Ambulatory Visit (INDEPENDENT_AMBULATORY_CARE_PROVIDER_SITE_OTHER): Payer: Medicare Other | Admitting: Family Medicine

## 2015-05-12 ENCOUNTER — Encounter: Payer: Self-pay | Admitting: Family Medicine

## 2015-05-12 VITALS — BP 140/96 | HR 80 | Temp 97.8°F | Resp 16 | Wt 202.0 lb

## 2015-05-12 DIAGNOSIS — K439 Ventral hernia without obstruction or gangrene: Secondary | ICD-10-CM

## 2015-05-12 DIAGNOSIS — M5136 Other intervertebral disc degeneration, lumbar region: Secondary | ICD-10-CM

## 2015-05-12 NOTE — Progress Notes (Signed)
Patient ID: Kayla Maxwell, female   DOB: [birth year], 70 y.o.   MRN: [id number]       Patient: Kayla Maxwell Female    DOB: 1945/08/20   70 y.o.   MRN: [id number] Visit Date: 05/12/2015  Today's Provider: Margarita Rana, MD   Chief Complaint  Patient presents with  . Hernia   Subjective:    HPI Patient c/o a "bulge" on her abdominal area for one week. Patient reports pain with touch only. Patient denies nausea, vomiting or diarrhea. Patient reports symptoms are unchanged since first noticing Bulge.   Back pain: Patient is requesting handicap form, due to back pain. Patient reports that she is unable to walk long distance.     Allergies  Allergen Reactions  . Codeine Other (See Comments)    Makes her feel "crazy"  . Penicillins Swelling  . Sulfa Antibiotics Nausea Only and Rash   Previous Medications   CETIRIZINE (ZYRTEC) 10 MG TABLET    Take 10 mg by mouth as needed. Reported on 12/31/2014   FLUTICASONE (FLONASE) 50 MCG/ACT NASAL SPRAY    Place 2 sprays into both nostrils daily.   IBUPROFEN (ADVIL,MOTRIN) 200 MG TABLET    Take 200 mg by mouth every 6 (six) hours as needed.   METOPROLOL TARTRATE (LOPRESSOR) 25 MG TABLET    Take 25 mg by mouth 2 (two) times daily.   MONTELUKAST (SINGULAIR) 10 MG TABLET    Take 1 tablet (10 mg total) by mouth at bedtime.   PANTOPRAZOLE (PROTONIX) 40 MG TABLET    Take 1 tablet (40 mg total) by mouth daily.   PRAVASTATIN (PRAVACHOL) 40 MG TABLET    take 1 tablet by mouth at bedtime   PROBIOTIC PRODUCT (PROBIOTIC ADVANCED PO)    Take 1 capsule by mouth daily.    RIVAROXABAN (XARELTO) 20 MG TABS TABLET    Take 20 mg by mouth daily with supper.    Review of Systems  Constitutional: Negative.   Cardiovascular: Negative.   Gastrointestinal: Positive for abdominal distention.  Musculoskeletal: Positive for back pain.    Social History  Substance Use Topics  . Smoking status: Former Smoker -- 1.00 packs/day for 45 years    Quit date: 08/19/2014  .  Smokeless tobacco: Former Systems developer    Quit date: 08/19/2014  . Alcohol Use: No     Comment: Rarely   Objective:   BP 140/96 mmHg  Pulse 80  Temp(Src) 97.8 F (36.6 C) (Oral)  Resp 16  Wt 202 lb (91.627 kg)  Physical Exam  Constitutional: She is oriented to person, place, and time. She appears well-developed and well-nourished.  Abdominal: Soft. Bowel sounds are normal. She exhibits no distension. There is tenderness. There is no rebound.  Swelling above umbilical area. Tender to palpation.    Neurological: She is alert and oriented to person, place, and time.        Assessment & Plan:     1. Hernia of abdominal wall New problem. Patient referred to general surgery for evaluation and follow-up. - Ambulatory referral to General Surgery  2. DDD (degenerative disc disease), lumbar Filled out handicap form.     Patient seen and examined by Dr. Jerrell Belfast, and note scribed by Philbert Riser. [place], CMA.  I have reviewed the document for accuracy and completeness and I agree with above. - Jerrell Belfast, MD   Margarita Rana, MD  [facility]

## 2015-05-15 ENCOUNTER — Ambulatory Visit (INDEPENDENT_AMBULATORY_CARE_PROVIDER_SITE_OTHER): Payer: Medicare Other | Admitting: General Surgery

## 2015-05-15 ENCOUNTER — Encounter: Payer: Self-pay | Admitting: General Surgery

## 2015-05-15 VITALS — BP 130/82 | HR 82 | Resp 12 | Ht 65.0 in | Wt 199.0 lb

## 2015-05-15 DIAGNOSIS — K439 Ventral hernia without obstruction or gangrene: Secondary | ICD-10-CM

## 2015-05-15 NOTE — Progress Notes (Signed)
Patient ID: Kayla Maxwell, female   DOB: 11-Dec-1945, 70 y.o.   MRN: [id number]  Chief Complaint  Patient presents with  . Hernia    HPI Kayla Maxwell is a 70 y.o. female.  Here today for evaluation of a possible hernia. She states she noticed it about a week ago. She states there is a bulge above her belly button with some tenderness. Denies any trauma.  NO GI complaints I have reviewed the history of present illness with the patient.  HPI  Past Medical History  Diagnosis Date  . Allergy     takes Singulair daily as needed  . Acid reflux     takes Nexium daily  . Hyperlipidemia     takes Pravastatin daily-started taking this 6 months ago  . Constipation     takes Colace daily as needed  . Headache     sinus   . TIA (transient ischemic attack)     showed up on an MRI but she never knew it  . Chronic back pain     stenosis  . Urinary frequency   . History of blood transfusion   . History of shingles   . Stroke ([place])     TIA'S X 2 --- one last yr and one this yr...lost her memory over 4-6 hrs.  . Sleep apnea   . Cancer [facility])     uterine cancer with removal  . Dysrhythmia     atrial fibrillation dx 04/2014  . Lumbar surgical wound fluid collection     Past Surgical History  Procedure Laterality Date  . Cesarean section  1968  . Appendectomy  1963  . Tonsillectomy    . Breast surgery  1984    augementation  . Abdominal hysterectomy    . Colonoscopy    . Esophagogastroduodenoscopy    . Lumbar laminectomy with coflex 1 level Bilateral 07/04/2014    Procedure: Laminectomy and Foraminotomy - bilateral - Lumbar Four-Five with coflex ;  Surgeon: Karie Chimera, MD;  Location: [facility];  Service: Neurosurgery;  Laterality: Bilateral;  . Lumbar wound debridement N/A 08/19/2014    Procedure: Exploration of Lumbar wound;  Surgeon: Karie Chimera, MD;  Location: [facility];  Service: Neurosurgery;  Laterality: N/A;  . Hammer toe surgery Bilateral   . Back surgery  08/19/2014     Dr. Hal Neer ([place] [facility])  . Cholecystectomy  2014    Dr Jamal Collin    Family History  Problem Relation Age of Onset  . Alzheimer's disease Mother   . Heart attack Father   . Pancreatic cancer Sister   . Healthy Brother     Social History Social History  Substance Use Topics  . Smoking status: Former Smoker -- 1.00 packs/day for 45 years    Quit date: 08/19/2014  . Smokeless tobacco: Former Systems developer    Quit date: 08/19/2014  . Alcohol Use: No     Comment: Rarely    Allergies  Allergen Reactions  . Codeine Other (See Comments)    Makes her feel "crazy"  . Penicillins Swelling  . Sulfa Antibiotics Nausea Only and Rash    Current Outpatient Prescriptions  Medication Sig Dispense Refill  . cetirizine (ZYRTEC) 10 MG tablet Take 10 mg by mouth as needed. Reported on 12/31/2014    . Docusate Sodium (COLACE PO) Take 2 tablets by mouth daily.    . fluticasone (FLONASE) 50 MCG/ACT nasal spray Place 2 sprays into both nostrils daily. (Patient taking differently: Place 2  sprays into both nostrils as needed. ) 16 g 6  . ibuprofen (ADVIL,MOTRIN) 200 MG tablet Take 200 mg by mouth every 6 (six) hours as needed.    . metoprolol tartrate (LOPRESSOR) 25 MG tablet Take 25 mg by mouth 2 (two) times daily.    . montelukast (SINGULAIR) 10 MG tablet Take 1 tablet (10 mg total) by mouth at bedtime. (Patient taking differently: Take 10 mg by mouth as needed. ) 30 tablet 3  . pantoprazole (PROTONIX) 40 MG tablet Take 1 tablet (40 mg total) by mouth daily. 30 tablet 5  . pravastatin (PRAVACHOL) 40 MG tablet take 1 tablet by mouth at bedtime 90 tablet 1  . Probiotic Product (PROBIOTIC ADVANCED PO) Take 1 capsule by mouth daily.     . rivaroxaban (XARELTO) 20 MG TABS tablet Take 20 mg by mouth daily with supper.     No current facility-administered medications for this visit.    Review of Systems Review of Systems  Constitutional: Negative.   Respiratory: Negative.   Cardiovascular: Negative.      Blood pressure 130/82, pulse 82, resp. rate 12, height 5\' 5"  (1.651 m), weight 199 lb (90.266 kg).  Physical Exam Physical Exam  Constitutional: She is oriented to person, place, and time. She appears well-developed and well-nourished.  HENT:  Mouth/Throat: Oropharynx is clear and moist.  Eyes: Conjunctivae are normal. No scleral icterus.  Neck: Neck supple.  Cardiovascular: Normal rate, regular rhythm and normal heart sounds.   Pulses:      Femoral pulses are 2+ on the right side, and 2+ on the left side. Pulmonary/Chest: Effort normal and breath sounds normal.  Abdominal: Soft. Normal appearance and bowel sounds are normal. There is tenderness. Hernia confirmed negative in the right inguinal area and confirmed negative in the left inguinal area.  4-5 cm hernia present and tender just above umbilicus.   Lymphadenopathy:    She has no cervical adenopathy.  Neurological: She is alert and oriented to person, place, and time.  Skin: Skin is warm and dry.  Psychiatric: Her behavior is normal.    Data Reviewed Progress notes.  Assessment    Ventral hernia. Likely this is from prior portsite.     Plan   Recommended repair possible use of mesh. Pt is agreeable. She history of cCAD and is on Xarelto. Will get her cardiologist to clear for surgery  . Xarelto to be stopped for 3 days prior to surgery Hernia precautions and incarceration were discussed with the patient. If they develop symptoms of an incarcerated hernia, they were encouraged to seek prompt medical attention.  I have recommended repair of the hernia using mesh on an outpatient basis in the near future. The risk of infection was reviewed. The role of prosthetic mesh to minimize the risk of recurrence was reviewed.       PCP:  Margarita Rana This information has been scribed by Karie Fetch RN, BSN,BC.   Zebulun Deman G 05/15/2015, 3:45 PM

## 2015-05-15 NOTE — Patient Instructions (Signed)
Hernia, Adult A hernia is the bulging of an organ or tissue through a weak spot in the muscles of the abdomen (abdominal wall). Hernias develop most often near the navel or groin. There are many kinds of hernias. Common kinds include:  Femoral hernia. This kind of hernia develops under the groin in the upper thigh area.  Inguinal hernia. This kind of hernia develops in the groin or scrotum.  Umbilical hernia. This kind of hernia develops near the navel.  Hiatal hernia. This kind of hernia causes part of the stomach to be pushed up into the chest.  Incisional hernia. This kind of hernia bulges through a scar from an abdominal surgery. CAUSES This condition may be caused by:  Heavy lifting.  Coughing over a long period of time.  Straining to have a bowel movement.  An incision made during an abdominal surgery.  A birth defect (congenital defect).  Excess weight or obesity.  Smoking.  Poor nutrition.  Cystic fibrosis.  Excess fluid in the abdomen.  Undescended testicles. SYMPTOMS Symptoms of a hernia include:  A lump on the abdomen. This is the first sign of a hernia. The lump may become more obvious with standing, straining, or coughing. It may get bigger over time if it is not treated or if the condition causing it is not treated.  Pain. A hernia is usually painless, but it may become painful over time if treatment is delayed. The pain is usually dull and may get worse with standing or lifting heavy objects. Sometimes a hernia gets tightly squeezed in the weak spot (strangulated) or stuck there (incarcerated) and causes additional symptoms. These symptoms may include:  Vomiting.  Nausea.  Constipation.  Irritability. DIAGNOSIS A hernia may be diagnosed with:  A physical exam. During the exam your health care provider may ask you to cough or to make a specific movement, because a hernia is usually more visible when you move.  Imaging tests. These can  include:  X-rays.  Ultrasound.  CT scan. TREATMENT A hernia that is small and painless may not need to be treated. A hernia that is large or painful may be treated with surgery. Inguinal hernias may be treated with surgery to prevent incarceration or strangulation. Strangulated hernias are always treated with surgery, because lack of blood to the trapped organ or tissue can cause it to die. Surgery to treat a hernia involves pushing the bulge back into place and repairing the weak part of the abdomen. HOME CARE INSTRUCTIONS  Avoid straining.  Do not lift anything heavier than 10 lb (4.5 kg).  Lift with your leg muscles, not your back muscles. This helps avoid strain.  When coughing, try to cough gently.  Prevent constipation. Constipation leads to straining with bowel movements, which can make a hernia worse or cause a hernia repair to break down. You can prevent constipation by:  Eating a high-fiber diet that includes plenty of fruits and vegetables.  Drinking enough fluids to keep your urine clear or pale yellow. Aim to drink 6-8 glasses of water per day.  Using a stool softener as directed by your health care provider.  Lose weight, if you are overweight.  Do not use any tobacco products, including cigarettes, chewing tobacco, or electronic cigarettes. If you need help quitting, ask your health care provider.  Keep all follow-up visits as directed by your health care provider. This is important. Your health care provider may need to monitor your condition. SEEK MEDICAL CARE IF:  You have   swelling, redness, and pain in the affected area.  Your bowel habits change. SEEK IMMEDIATE MEDICAL CARE IF:  You have a fever.  You have abdominal pain that is getting worse.  You feel nauseous or you vomit.  You cannot push the hernia back in place by gently pressing on it while you are lying down.  The hernia:  Changes in shape or size.  Is stuck outside the  abdomen.  Becomes discolored.  Feels hard or tender.   This information is not intended to replace advice given to you by your health care provider. Make sure you discuss any questions you have with your health care provider.   Document Released: 12/21/2004 Document Revised: 01/11/2014 Document Reviewed: 10/31/2013 Elsevier Interactive Patient Education 2016 [employer]vier Inc.  

## 2015-05-19 ENCOUNTER — Other Ambulatory Visit: Payer: Self-pay | Admitting: General Surgery

## 2015-05-19 ENCOUNTER — Telehealth: Payer: Self-pay | Admitting: *Deleted

## 2015-05-19 DIAGNOSIS — K436 Other and unspecified ventral hernia with obstruction, without gangrene: Secondary | ICD-10-CM

## 2015-05-19 NOTE — Telephone Encounter (Signed)
Cardiac clearance received by Dr. Nehemiah Massed. Patient to stop Xarelto 3 days prior to surgery.   Patient's ventral hernia repair has been scheduled for 05-26-15 at [facility]

## 2015-05-22 ENCOUNTER — Encounter: Payer: Self-pay | Admitting: *Deleted

## 2015-05-22 ENCOUNTER — Other Ambulatory Visit: Payer: Medicare Other

## 2015-05-22 NOTE — Patient Instructions (Addendum)
  Your procedure is scheduled on: 05-26-15 Report to [place] To find out your arrival time please call [phone] between 1PM - 3PM on 05-23-15  Remember: Instructions that are not followed completely may result in serious medical risk, up to and including death, or upon the discretion of your surgeon and anesthesiologist your surgery may need to be rescheduled.    _X___ 1. Do not eat food or drink liquids after midnight. No gum chewing or hard candies.     _X___ 2. No Alcohol for 24 hours before or after surgery.   ____ 3. Bring all medications with you on the day of surgery if instructed.    ___ 4. Notify your doctor if there is any change in your medical condition     (cold, fever, infections).     Do not wear jewelry, make-up, hairpins, clips or nail polish.  Do not wear lotions, powders, or perfumes. You may wear deodorant.  Do not shave 48 hours prior to surgery. Men may shave face and neck.  Do not bring valuables to the hospital.    [facility] is not responsible for any belongings or valuables.               Contacts, dentures or bridgework may not be worn into surgery.  Leave your suitcase in the car. After surgery it may be brought to your room.  For patients admitted to the hospital, discharge time is determined by your treatment team.   Patients discharged the day of surgery will not be allowed to drive home.   Please read over the following fact sheets that you were given:     _X___ Take these medicines the morning of surgery with A SIP OF WATER:    1. METOPROLOL  2. PROTONIX  3. TAKE AN EXTRA PROTONIX ON Sunday NIGHT BEFORE BED  4.  5.  6.  ____ Fleet Enema (as directed)   ____ Use CHG Soap as directed  ____ Use inhalers on the day of surgery  ____ Stop metformin 2 days prior to surgery    ____ Take 1/2 of usual insulin dose the night before surgery and none on the morning of surgery.   _X___ Stop  Coumadin/Plavix/aspirin-STOP XARELTO 3 DAYS PRIOR TO SURGERY PER DR Nehemiah Massed  _X___ Stop Anti-inflammatories-STOP IBUPROFEN NOW-NO NSAIDS OR ASA PRODUCTS-TYLENOL OK TO TAKE   _X___ Stop supplements until after surgery-STOP PROBIOTIC NOW   _X___ Bring C-Pap to the hospital.

## 2015-05-22 NOTE — Pre-Procedure Instructions (Signed)
CARDIAC CLEARANCE NOTE ON CHART FROM DR Nehemiah Massed

## 2015-05-22 NOTE — Pre-Procedure Instructions (Signed)
Value Range  Vent Rate (bpm) 79   QRS Interval (msec) 86   QT Interval (msec) 408   QTc (msec) 467    Result Narrative  Atrial fibrillation Moderate voltage criteria for LVH, may be normal variant ST and Marked T wave abnormality, consider inferolateral ischemia Abnormal ECG When compared with ECG of 08-May-2014 10:26, No significant change was found I reviewed and concur with this report. Electronically signed [id number] MD, Darnell Level [phone]) on 07/04/2014 5:20:28 PM   Status Results Details   Encounter Summary  May

## 2015-05-22 NOTE — Pre-Procedure Instructions (Signed)
Kayla Dodge, NP - 04/07/2015 10:30 AM EDT Formatting of this note may be different from the original. Established Patient Visit   Chief Complaint: Chief Complaint  Patient presents with  . Follow-up  80mo  . Ankle Swelling  2 wks ago  . Hypertension  . Hyperlipidemia  . Atrial Fibrillation  . Cardiac Valve Problem  . Sleep Apnea  Date of Service: 04/07/2015 Date of Birth: 401/10/1947PCP: NMargarita Rana MD  History of Present Illness: Kayla Maxwell is a 70y.o.female patient who comes in for follow-up and states that she has continued to abstain from all forms of tobacco since last August, and she was congratulated on her efforts. Her weight is up approximately 22 pounds, and she attributes this, in part, to tobacco cessation. In addition to that, her activity has been somewhat limited due to her recent back surgery. We discussed the importance of DASH diet and regular physical activity, including simple walking. She denies any episodes of chest pain or worsening dyspnea on exertion. Heart rate and blood pressure adequately controlled with the use of beta-blocker. She also continues with pravastatin for dyslipidemia, and we do not have record of a recent fasting lipid profile. For this reason, as well as her altered kidney function while hospitalized last year, we will obtain blood work today, with the patient being in a fasting state. She continues with Xarelto 20 mg daily for risk reduction of stroke with atrial fibrillation, with no evidence of bleeding. An echocardiogram performed last year has shown normal LV function with EF 55% and moderate mitral and tricuspid regurgitation with trivial pulmonic regurgitation.  Past Medical and Surgical History  Past Medical History Past Medical History  Diagnosis Date  . Acid reflux  . Asthma without status asthmaticus  . Chickenpox  . Hyperlipidemia, unspecified  . Peripheral vascular disease (CMS-HCC)  . Seasonal allergies  . Shingles   twice  . Sleep apnea  . Uterine cancer (CMS-HCC) 1978?   Past Surgical History She has a past surgical history that includes Tonsillectomy; Appendectomy; Removal Ovarian Cyst (Right); Hysterectomy; Wisdom Teeth Removed; Cholecystectomy; Cesarean section; and back surgery (07/04/2014).   Medications and Allergies  Current Medications  Current Outpatient Prescriptions  Medication Sig Dispense Refill  . BACILLUS COAGULANS (PROBIOTIC, B. COAGULANS, ORAL) Take by mouth.  . esomeprazole (NEXIUM) 40 MG DR capsule Take 40 mg by mouth once daily.  .Marland Kitchenibuprofen (ADVIL,MOTRIN) 200 MG tablet Take 600 mg by mouth every 6 (six) hours as needed for Pain.  . metoprolol tartrate (LOPRESSOR) 25 MG tablet Take 1 tablet (25 mg total) by mouth 2 (two) times daily. 60 tablet 11  . multivitamin capsule Take 1 capsule by mouth once daily.  . pravastatin (PRAVACHOL) 40 MG tablet Take 40 mg by mouth nightly.  0  . rivaroxaban (XARELTO) 20 mg tablet Take 1 tablet (20 mg total) by mouth daily with dinner. 30 tablet 4   No current facility-administered medications for this visit.   Allergies: Codeine; Penicillins; and Sulfa (sulfonamide antibiotics)  Social and Family History  Social History reports that she quit smoking about 7 months ago. She smoked 1.00 pack per day. She has never used smokeless tobacco. She reports that she does not drink alcohol or use illicit drugs.  Family History Family History  Problem Relation Age of Onset  . Alzheimer's disease Mother  . Heart attack Father   Review of Systems   Review of Systems: Positive for 22 pound weight gain, no weakness or fatigue, vision  change or hearing loss, cough or congestion, nausea or vomiting, diarrhea, melena or stomach pain, leg weakness or pain, leg blood clots or cramping, headache or blackouts, dizziness, trouble swallowing, skin rashes or sores, muscle weakness or numbness, anxiety or depression  Physical Examination   Vitals: Visit  Vitals  . BP (P) 120/90 (BP Location: Left upper arm, Patient Position: Sitting, BP Cuff Size: Large Adult)  . Pulse (P) 70  . Resp (P) 16  . Ht (P) 165.1 cm (_0 )  . Wt (P) 90.1 kg (198 lb 9.6 oz)  . SpO2 (P) 95%  . BMI (P) 33.05 kg/m2   Ht:(P) 165.1 cm (_1 ) Wt:(P) 90.1 kg (198 lb 9.6 oz) [id number] surface area is 2.03 meters squared (pended). Body mass index is 33.05 kg/(m^2) (pended). Appearance: well appearing in no acute distress HEENT: Pupils equally reactive to light and accomodation without xantholasma  Neck: Supple without thyromegaly  Lungs: clear to auscultation bilaterally; no wheezes, rales, rhonchi Heart: Regular rate and rhythm. Normal S1 S2 No gallops, murmurs or rub, PMI in normal place and size. carotid upstroke normal without bruit. Jugular venous pressure is normal Abdomen: soft nontender, nondistended, with normal bowel sounds. Abdominal aorta is normal size without bruit Extremities: no cyanosis, clubbing, or edema Peripheral Pulses: 2+ in all extremities, 2+ femoral pulses bilaterally Musculoskeletal; Normal muscle tone without kyphosis Neurological:Oriented to time, place, and person with normal mood and affect  Assessment   70 y.o. female with  1. Chronic a-fib (CMS-HCC)  2. Moderate mitral insufficiency  3. Moderate tricuspid insufficiency  4. Obstructive sleep apnea syndrome  5. Gastroesophageal reflux disease without esophagitis  6. Cigarette nicotine dependence with nicotine-induced disorder  7. Atrial fibrillation by electrocardiogram , unspecified (CMS-HCC)  8. Shortness of breath  9. Hyperlipidemia, mixed   Needing further evaluation and treatment options.  Plan  -Continue beta-blocker for heart rate and blood pressure control. -Continue moderate to high intensive cholesterol therapy for further future risk reduction in cardiovascular disease  -Continue Xarelto for anticoagulation and further risk reduction of stroke with atrial  fibrillation/flutter. Patient understands all risks and benefits of this medication management including the possibility of side effects including bleeding, bruising, stroke, and interactions with other medication management  -Patient was congratulated on her efforts with ongoing complete and total abstinence and from all forms of nicotine. -We have suggested to the patient that a new diet plan rich in fiber, fruits, vegetables, and nuts may improve lifestyle and quality of life measures as well as reducing calorie intake for help with weight loss  -We will obtain fasting lipid profile today, with patient in a fasting state. We will also obtain complete metabolic panel to assess electrolytes, kidney function and liver function, with further adjustments in medications to follow, if necessary  Orders Placed This Encounter  Procedures  . Lipid Panel w/calc LDL  . Comprehensive Metabolic Panel (CMP)   Return in about 6 months (around 10/07/2015).  [place], NP    Plan of Treatment - as of this encounter Upcoming Encounters Upcoming Encounters  Date Type Specialty Care Team Description  10/06/2015 Office Visit Cardiology Kayla Dodge, NP  [place]  [facility]  [place], [place] [address]  [phone]  [phone] (Fax)    Lab Results - in this encounter Table of Contents for Lab Results Comprehensive Metabolic Panel (CMP) (19/62/2297 12:08 PM) Lipid Panel w/calc LDL (04/07/2015 12:08 PM)    Comprehensive Metabolic Panel (CMP) (98/92/1194 12:08 PM) Comprehensive Metabolic  Panel (CMP) (04/07/2015 12:08 PM)  Component Value Ref Range  Glucose 111 (H) 70 - 110 mg/dL  Sodium 141 136 - 145 mmol/L  Potassium 5.1 3.6 - 5.1 mmol/L  Chloride 104 97 - 109 mmol/L  Carbon Dioxide (CO2) 32.6 (H) 22.0 - 32.0 mmol/L  Urea Nitrogen (BUN) 21 7 - 25 mg/dL  Creatinine 1.1 0.6 - 1.1 mg/dL  Glomerular Filtration Rate (eGFR), MDRD Estimate 49 (L)  >60 mL/min/1.73sq m  Calcium 9.6 8.7 - 10.3 mg/dL  AST  14 8 - 39 U/L  ALT  13 5 - 38 U/L  Alk Phos (alkaline Phosphatase) 73 34 - 104 U/L  Albumin 4.3 3.5 - 4.8 g/dL  Bilirubin, Total 0.7 0.3 - 1.2 mg/dL  Protein, Total 6.6 6.1 - 7.9 g/dL  A/G Ratio 1.9 1.0 - 5.0 gm/dL   Comprehensive Metabolic Panel (CMP) (45/03/8880 12:08 PM)  Specimen Performing Laboratory  [facility] - LAB  [place], [place] [address]   Back to top of Lab Results   Lipid Panel w/calc LDL (04/07/2015 12:08 PM) Lipid Panel w/calc LDL (04/07/2015 12:08 PM)  Component Value Ref Range  Cholesterol, Total 156 100 - 200 mg/dL  Triglyceride 169 35 - 199 mg/dL  HDL (High Density Lipoprotein) Cholesterol 55.5 35.0 - 85.0 mg/dL  LDL (Low Density Lipoprotien), Calculated 67 0 - 130 mg/dL  VLDL Cholesterol 34 mg/dL  Cholesterol/HDL Ratio 2.8    Lipid Panel w/calc LDL (04/07/2015 12:08 PM)  Specimen Performing Laboratory  Blood [facility] - LAB  [place], [place] [address]   Back to top of Lab Results Visit Diagnoses - in this encounter Diagnosis  Chronic a-fib (CMS-HCC) - Primary  Atrial fibrillation   Moderate mitral insufficiency  Moderate tricuspid insufficiency  Obstructive sleep apnea syndrome  Obstructive sleep apnea (adult) (pediatric)   Gastroesophageal reflux disease without esophagitis  Esophageal reflux   Cigarette nicotine dependence with nicotine-induced disorder  Atrial fibrillation by electrocardiogram , unspecified (CMS-HCC)  Shortness of breath  Hyperlipidemia, mixed  Mixed hyperlipidemia   Discontinued Medications - as of this encounter Prescription Sig. Discontinue Reason Start Date End Date  acetaminophen (TYLENOL) 500 MG tablet  Take 500 mg by mouth every 6 (six) hours as needed for Pain. Discontinued by another clinician  04/07/2015  DOCUSATE CALCIUM (STOOL SOFTENER ORAL)  Take by mouth. 2 tablets  every day Discontinued by another clinician  04/07/2015  Historical Medications - added in this encounter This list may reflect changes made after this encounter.  Medication Sig. Disp. Refills Start Date End Date  ibuprofen (ADVIL,MOTRIN) 200 MG tablet  Take 600 mg by mouth every 6 (six) hours as needed for Pain.      Document Information Service Providers Document Coverage Dates Apr. 03, 2017 - Apr. 03, 2017 [place] [phone] (Work) [place], [place] [address] Encounter Providers Kayla Dodge NP (Attending) [phone] (Work) [phone] (Fax)  [facility]-Card[facility] [place], [place] [address]

## 2015-05-26 ENCOUNTER — Encounter
Admission: RE | Disposition: A | Discharge status: Home / Self Care | Payer: Self-pay | Source: Ambulatory Visit | Attending: General Surgery

## 2015-05-26 ENCOUNTER — Ambulatory Visit
Admission: RE | Admit: 2015-05-26 | Discharge: 2015-05-26 | Disposition: A | Discharge status: Home / Self Care | Payer: Medicare Other | Source: Ambulatory Visit | Attending: General Surgery | Admitting: General Surgery

## 2015-05-26 ENCOUNTER — Ambulatory Visit: Payer: Medicare Other | Admitting: Anesthesiology

## 2015-05-26 ENCOUNTER — Encounter: Payer: Self-pay | Admitting: *Deleted

## 2015-05-26 DIAGNOSIS — G8929 Other chronic pain: Secondary | ICD-10-CM | POA: Diagnosis not present

## 2015-05-26 DIAGNOSIS — K219 Gastro-esophageal reflux disease without esophagitis: Secondary | ICD-10-CM | POA: Insufficient documentation

## 2015-05-26 DIAGNOSIS — Z9049 Acquired absence of other specified parts of digestive tract: Secondary | ICD-10-CM | POA: Diagnosis not present

## 2015-05-26 DIAGNOSIS — Z90710 Acquired absence of both cervix and uterus: Secondary | ICD-10-CM | POA: Diagnosis not present

## 2015-05-26 DIAGNOSIS — Z882 Allergy status to sulfonamides status: Secondary | ICD-10-CM | POA: Insufficient documentation

## 2015-05-26 DIAGNOSIS — Z880 Allergy status to penicillin: Secondary | ICD-10-CM | POA: Diagnosis not present

## 2015-05-26 DIAGNOSIS — G4730 Sleep apnea, unspecified: Secondary | ICD-10-CM | POA: Insufficient documentation

## 2015-05-26 DIAGNOSIS — Z800 Family history of malignant neoplasm of digestive organs: Secondary | ICD-10-CM | POA: Insufficient documentation

## 2015-05-26 DIAGNOSIS — Z8249 Family history of ischemic heart disease and other diseases of the circulatory system: Secondary | ICD-10-CM | POA: Insufficient documentation

## 2015-05-26 DIAGNOSIS — I10 Essential (primary) hypertension: Secondary | ICD-10-CM | POA: Insufficient documentation

## 2015-05-26 DIAGNOSIS — Z8673 Personal history of transient ischemic attack (TIA), and cerebral infarction without residual deficits: Secondary | ICD-10-CM | POA: Insufficient documentation

## 2015-05-26 DIAGNOSIS — R51 Headache: Secondary | ICD-10-CM | POA: Insufficient documentation

## 2015-05-26 DIAGNOSIS — R350 Frequency of micturition: Secondary | ICD-10-CM | POA: Insufficient documentation

## 2015-05-26 DIAGNOSIS — Z885 Allergy status to narcotic agent status: Secondary | ICD-10-CM | POA: Diagnosis not present

## 2015-05-26 DIAGNOSIS — Z8542 Personal history of malignant neoplasm of other parts of uterus: Secondary | ICD-10-CM | POA: Insufficient documentation

## 2015-05-26 DIAGNOSIS — M549 Dorsalgia, unspecified: Secondary | ICD-10-CM | POA: Insufficient documentation

## 2015-05-26 DIAGNOSIS — Z8619 Personal history of other infectious and parasitic diseases: Secondary | ICD-10-CM | POA: Diagnosis not present

## 2015-05-26 DIAGNOSIS — K5900 Constipation, unspecified: Secondary | ICD-10-CM | POA: Diagnosis not present

## 2015-05-26 DIAGNOSIS — Z820 Family history of epilepsy and other diseases of the nervous system: Secondary | ICD-10-CM | POA: Insufficient documentation

## 2015-05-26 DIAGNOSIS — Z87891 Personal history of nicotine dependence: Secondary | ICD-10-CM | POA: Insufficient documentation

## 2015-05-26 DIAGNOSIS — I2510 Atherosclerotic heart disease of native coronary artery without angina pectoris: Secondary | ICD-10-CM | POA: Diagnosis not present

## 2015-05-26 DIAGNOSIS — I4891 Unspecified atrial fibrillation: Secondary | ICD-10-CM | POA: Diagnosis not present

## 2015-05-26 DIAGNOSIS — K439 Ventral hernia without obstruction or gangrene: Secondary | ICD-10-CM | POA: Diagnosis not present

## 2015-05-26 DIAGNOSIS — D649 Anemia, unspecified: Secondary | ICD-10-CM | POA: Diagnosis not present

## 2015-05-26 DIAGNOSIS — K430 Incisional hernia with obstruction, without gangrene: Secondary | ICD-10-CM | POA: Diagnosis not present

## 2015-05-26 DIAGNOSIS — J449 Chronic obstructive pulmonary disease, unspecified: Secondary | ICD-10-CM | POA: Diagnosis not present

## 2015-05-26 DIAGNOSIS — E785 Hyperlipidemia, unspecified: Secondary | ICD-10-CM | POA: Insufficient documentation

## 2015-05-26 DIAGNOSIS — K436 Other and unspecified ventral hernia with obstruction, without gangrene: Secondary | ICD-10-CM

## 2015-05-26 DIAGNOSIS — F419 Anxiety disorder, unspecified: Secondary | ICD-10-CM | POA: Diagnosis not present

## 2015-05-26 HISTORY — DX: Other complications of anesthesia, initial encounter: T88.59XA

## 2015-05-26 HISTORY — DX: Adverse effect of unspecified anesthetic, initial encounter: T41.45XA

## 2015-05-26 HISTORY — PX: VENTRAL HERNIA REPAIR: SHX424

## 2015-05-26 HISTORY — DX: Failed or difficult intubation, initial encounter: T88.4XXA

## 2015-05-26 HISTORY — DX: Atherosclerotic heart disease of native coronary artery without angina pectoris: I25.10

## 2015-05-26 HISTORY — DX: Anemia, unspecified: D64.9

## 2015-05-26 SURGERY — REPAIR, HERNIA, VENTRAL
Anesthesia: General | Wound class: Clean

## 2015-05-26 MED ORDER — LIDOCAINE HCL (CARDIAC) 20 MG/ML IV SOLN
INTRAVENOUS | Status: DC | PRN
  Administered 2015-05-26: 60 mg via INTRAVENOUS

## 2015-05-26 MED ORDER — PROPOFOL 10 MG/ML IV BOLUS
INTRAVENOUS | Status: DC | PRN
  Administered 2015-05-26: 100 mg via INTRAVENOUS
  Administered 2015-05-26: 150 mg via INTRAVENOUS

## 2015-05-26 MED ORDER — SUCCINYLCHOLINE CHLORIDE 20 MG/ML IJ SOLN
INTRAMUSCULAR | Status: DC | PRN
  Administered 2015-05-26 (×2): 100 mg via INTRAVENOUS

## 2015-05-26 MED ORDER — CEFAZOLIN SODIUM-DEXTROSE 2-4 GM/100ML-% IV SOLN
INTRAVENOUS | Status: AC
  Administered 2015-05-26: 2 g via INTRAVENOUS
  Filled 2015-05-26: qty 100

## 2015-05-26 MED ORDER — ONDANSETRON HCL 4 MG/2ML IJ SOLN
INTRAMUSCULAR | Status: AC
  Filled 2015-05-26: qty 2

## 2015-05-26 MED ORDER — LIDOCAINE HCL (PF) 1 % IJ SOLN
INTRAMUSCULAR | Status: DC | PRN
  Administered 2015-05-26: 10 mL

## 2015-05-26 MED ORDER — FENTANYL CITRATE (PF) 100 MCG/2ML IJ SOLN: 25.0000 ug | INTRAMUSCULAR | Status: DC | PRN

## 2015-05-26 MED ORDER — FENTANYL CITRATE (PF) 100 MCG/2ML IJ SOLN
INTRAMUSCULAR | Status: DC | PRN
  Administered 2015-05-26: 100 ug via INTRAVENOUS
  Administered 2015-05-26: 50 ug via INTRAVENOUS

## 2015-05-26 MED ORDER — DEXAMETHASONE SODIUM PHOSPHATE 10 MG/ML IJ SOLN
INTRAMUSCULAR | Status: DC | PRN
  Administered 2015-05-26: 10 mg via INTRAVENOUS

## 2015-05-26 MED ORDER — CEFAZOLIN SODIUM-DEXTROSE 2-4 GM/100ML-% IV SOLN
2.0000 g | INTRAVENOUS | Status: AC
  Administered 2015-05-26: 2 g via INTRAVENOUS

## 2015-05-26 MED ORDER — SUGAMMADEX SODIUM 200 MG/2ML IV SOLN
INTRAVENOUS | Status: DC | PRN
  Administered 2015-05-26: 180.6 mg via INTRAVENOUS

## 2015-05-26 MED ORDER — CHLORHEXIDINE GLUCONATE 4 % EX LIQD: 1.0000 "application " | Freq: Once | CUTANEOUS | Status: DC

## 2015-05-26 MED ORDER — ONDANSETRON HCL 4 MG/2ML IJ SOLN
INTRAMUSCULAR | Status: DC | PRN
  Administered 2015-05-26: 4 mg via INTRAVENOUS

## 2015-05-26 MED ORDER — TRAMADOL HCL 50 MG PO TABS
ORAL_TABLET | ORAL | Status: AC
  Administered 2015-05-26: 11:00:00
  Filled 2015-05-26: qty 1

## 2015-05-26 MED ORDER — LIDOCAINE HCL (PF) 1 % IJ SOLN
INTRAMUSCULAR | Status: AC
  Filled 2015-05-26: qty 30

## 2015-05-26 MED ORDER — LACTATED RINGERS IV SOLN
INTRAVENOUS | Status: DC
  Administered 2015-05-26 (×2): via INTRAVENOUS

## 2015-05-26 MED ORDER — PHENYLEPHRINE HCL 10 MG/ML IJ SOLN
INTRAMUSCULAR | Status: DC | PRN
  Administered 2015-05-26 (×3): 100 ug via INTRAVENOUS
  Administered 2015-05-26: 200 ug via INTRAVENOUS
  Administered 2015-05-26: 100 ug via INTRAVENOUS

## 2015-05-26 MED ORDER — ONDANSETRON HCL 4 MG/2ML IJ SOLN
4.0000 mg | Freq: Once | INTRAMUSCULAR | Status: AC | PRN
  Administered 2015-05-26: 4 mg via INTRAVENOUS

## 2015-05-26 MED ORDER — MIDAZOLAM HCL 2 MG/2ML IJ SOLN
INTRAMUSCULAR | Status: DC | PRN
  Administered 2015-05-26: 2 mg via INTRAVENOUS

## 2015-05-26 MED ORDER — PROMETHAZINE HCL 25 MG PO TABS
ORAL_TABLET | ORAL | Status: AC
  Filled 2015-05-26: qty 1

## 2015-05-26 MED ORDER — PROMETHAZINE HCL 25 MG PO TABS
25.0000 mg | ORAL_TABLET | Freq: Once | ORAL | Status: AC
  Administered 2015-05-26: 25 mg via ORAL

## 2015-05-26 MED ORDER — BUPIVACAINE HCL (PF) 0.5 % IJ SOLN
INTRAMUSCULAR | Status: AC
  Filled 2015-05-26: qty 30

## 2015-05-26 MED ORDER — TRAMADOL HCL 50 MG PO TABS
50.0000 mg | ORAL_TABLET | Freq: Four times a day (QID) | ORAL | Status: DC | PRN
Start: 2015-05-26 — End: 2015-06-09

## 2015-05-26 MED ORDER — ACETAMINOPHEN 10 MG/ML IV SOLN
INTRAVENOUS | Status: AC
  Filled 2015-05-26: qty 100

## 2015-05-26 MED ORDER — BUPIVACAINE HCL (PF) 0.5 % IJ SOLN
INTRAMUSCULAR | Status: DC | PRN
  Administered 2015-05-26: 10 mL

## 2015-05-26 SURGICAL SUPPLY — 37 items
BLADE SURG 15 STRL SS SAFETY (BLADE) ×2 IMPLANT
CANISTER SUCT 1200ML W/VALVE (MISCELLANEOUS) ×2 IMPLANT
CHLORAPREP W/TINT 26ML (MISCELLANEOUS) ×2 IMPLANT
DECANTER SPIKE VIAL GLASS SM (MISCELLANEOUS) ×4 IMPLANT
DRAPE LAPAROTOMY 100X77 ABD (DRAPES) ×2 IMPLANT
DRESSING TELFA 4X3 1S ST N-ADH (GAUZE/BANDAGES/DRESSINGS) ×2 IMPLANT
DRSG TEGADERM 4X4.75 (GAUZE/BANDAGES/DRESSINGS) ×2 IMPLANT
ELECT REM PT RETURN 9FT ADLT (ELECTROSURGICAL) ×2
ELECTRODE REM PT RTRN 9FT ADLT (ELECTROSURGICAL) ×1 IMPLANT
GLOVE BIO SURGEON STRL SZ7 (GLOVE) ×2 IMPLANT
GOWN STRL REUS W/ TWL LRG LVL3 (GOWN DISPOSABLE) ×2 IMPLANT
GOWN STRL REUS W/TWL LRG LVL3 (GOWN DISPOSABLE) ×2
KIT RM TURNOVER STRD PROC AR (KITS) ×2 IMPLANT
LABEL OR SOLS (LABEL) ×2 IMPLANT
MESH VENTRALEX ST 8CM LRG (Mesh General) ×2 IMPLANT
NDL HPO THNWL 1X22GA REG BVL (NEEDLE) ×1 IMPLANT
NEEDLE HYPO 25X1 1.5 SAFETY (NEEDLE) ×4 IMPLANT
NEEDLE SAFETY 22GX1 (NEEDLE) ×1
NS IRRIG 500ML POUR BTL (IV SOLUTION) ×2 IMPLANT
PACK BASIN MINOR ARMC (MISCELLANEOUS) ×2 IMPLANT
SPONGE KITTNER 5P (MISCELLANEOUS) ×2 IMPLANT
SPONGE LAP 18X18 5 PK (GAUZE/BANDAGES/DRESSINGS) ×2 IMPLANT
STRIP CLOSURE SKIN 1/2X4 (GAUZE/BANDAGES/DRESSINGS) ×2 IMPLANT
SUT MNCRL AB 3-0 PS2 27 (SUTURE) ×2 IMPLANT
SUT PROLENE 0 CT 1 30 (SUTURE) ×6 IMPLANT
SUT PROLENE 0 CT 2 (SUTURE) IMPLANT
SUT VIC AB 2-0 CT1 27 (SUTURE) ×1
SUT VIC AB 2-0 CT1 TAPERPNT 27 (SUTURE) ×1 IMPLANT
SUT VIC AB 2-0 CT2 27 (SUTURE) ×2 IMPLANT
SUT VIC AB 2-0 SH 27 (SUTURE) ×1
SUT VIC AB 2-0 SH 27XBRD (SUTURE) ×1 IMPLANT
SUT VIC AB 3-0 SH 27 (SUTURE) ×1
SUT VIC AB 3-0 SH 27X BRD (SUTURE) ×1 IMPLANT
SUT VIC AB 4-0 FS2 27 (SUTURE) ×2 IMPLANT
SUT VICRYL+ 3-0 144IN (SUTURE) ×2 IMPLANT
SWABSTK COMLB BENZOIN TINCTURE (MISCELLANEOUS) ×2 IMPLANT
SYR CONTROL 10ML (SYRINGE) ×2 IMPLANT

## 2015-05-26 NOTE — Anesthesia Procedure Notes (Signed)
Procedure Name: Intubation Date/Time: 05/26/2015 7:52 AM Performed by: Justus Memory Pre-anesthesia Checklist: Patient identified, Emergency Drugs available, Suction available, Patient being monitored and Timeout performed Patient Re-evaluated:Patient Re-evaluated prior to inductionOxygen Delivery Method: Circle system utilized Preoxygenation: Pre-oxygenation with 100% oxygen Intubation Type: IV induction Ventilation: Mask ventilation throughout procedure and Oral airway inserted - appropriate to patient size Laryngoscope Size: Glidescope, McGraph, 3 and Miller Grade View: Grade IV Tube type: Oral Tube size: 7.0 mm Number of attempts: 4 Airway Equipment and Method: Stylet,  Video-laryngoscopy,  Bougie stylet and Oral airway Placement Confirmation: ETT inserted through vocal cords under direct vision Secured at: 21 cm Tube secured with: Tape Dental Injury: Teeth and Oropharynx as per pre-operative assessment  Difficulty Due To: Difficulty was anticipated and Difficult Airway- due to anterior larynx Future Recommendations: Recommend- awake intubation Comments: Pt presents with anterior appearing airway, TD < 3cm, first attempt was with glidescope, lopro S3, cords were brought into view but unable to pass ETT or bougie, second attempt with McGrath, same view as glidescope but could not pass ETT or bougie, 3rd attempt was with miller 2, no view obtained, 4th attempt was with McGrath and rigid Glidescope stylet, ETT passed successfully through vocal cords.

## 2015-05-26 NOTE — Transfer of Care (Signed)
Immediate Anesthesia Transfer of Care Note  Patient: Kayla Maxwell  Procedure(s) Performed: Procedure(s): HERNIA REPAIR VENTRAL ADULT (N/A)  Patient Location: PACU  Anesthesia Type:General  Level of Consciousness: awake, alert  and oriented  Airway & Oxygen Therapy: Patient Spontanous Breathing and Patient connected to nasal cannula oxygen  Post-op Assessment: Report given to RN and Post -op Vital signs reviewed and stable  Post vital signs: Reviewed and stable  Last Vitals:  Filed Vitals:   05/26/15 0923 05/26/15 0934  BP: 175/89 145/95  Pulse: 38 110  Temp:    Resp: 16 20    Last Pain:  Filed Vitals:   05/26/15 0937  PainSc: 0-No pain         Complications: No apparent anesthesia complications

## 2015-05-26 NOTE — Addendum Note (Signed)
Addendum  created 05/26/15 1056 by Justus Memory, CRNA   Modules edited: Charges VN

## 2015-05-26 NOTE — H&P (View-Only) (Signed)
Patient ID: Kayla Maxwell, female   DOB: Aug 12, 1945, 70 y.o.   MRN: [id number]  Chief Complaint  Patient presents with  . Hernia    HPI Kayla Maxwell is a 70 y.o. female.  Here today for evaluation of a possible hernia. She states she noticed it about a week ago. She states there is a bulge above her belly button with some tenderness. Denies any trauma.  NO GI complaints I have reviewed the history of present illness with the patient.  HPI  Past Medical History  Diagnosis Date  . Allergy     takes Singulair daily as needed  . Acid reflux     takes Nexium daily  . Hyperlipidemia     takes Pravastatin daily-started taking this 6 months ago  . Constipation     takes Colace daily as needed  . Headache     sinus   . TIA (transient ischemic attack)     showed up on an MRI but she never knew it  . Chronic back pain     stenosis  . Urinary frequency   . History of blood transfusion   . History of shingles   . Stroke ([place])     TIA'S X 2 --- one last yr and one this yr...lost her memory over 4-6 hrs.  . Sleep apnea   . Cancer [facility])     uterine cancer with removal  . Dysrhythmia     atrial fibrillation dx 04/2014  . Lumbar surgical wound fluid collection     Past Surgical History  Procedure Laterality Date  . Cesarean section  1968  . Appendectomy  1963  . Tonsillectomy    . Breast surgery  1984    augementation  . Abdominal hysterectomy    . Colonoscopy    . Esophagogastroduodenoscopy    . Lumbar laminectomy with coflex 1 level Bilateral 07/04/2014    Procedure: Laminectomy and Foraminotomy - bilateral - Lumbar Four-Five with coflex ;  Surgeon: Karie Chimera, MD;  Location: [place] [facility];  Service: Neurosurgery;  Laterality: Bilateral;  . Lumbar wound debridement N/A 08/19/2014    Procedure: Exploration of Lumbar wound;  Surgeon: Karie Chimera, MD;  Location: [place] [facility];  Service: Neurosurgery;  Laterality: N/A;  . Hammer toe surgery Bilateral   . Back surgery  08/19/2014     Dr. Hal Neer ([facility])  . Cholecystectomy  2014    Dr Jamal Collin    Family History  Problem Relation Age of Onset  . Alzheimer's disease Mother   . Heart attack Father   . Pancreatic cancer Sister   . Healthy Brother     Social History Social History  Substance Use Topics  . Smoking status: Former Smoker -- 1.00 packs/day for 45 years    Quit date: 08/19/2014  . Smokeless tobacco: Former Systems developer    Quit date: 08/19/2014  . Alcohol Use: No     Comment: Rarely    Allergies  Allergen Reactions  . Codeine Other (See Comments)    Makes her feel "crazy"  . Penicillins Swelling  . Sulfa Antibiotics Nausea Only and Rash    Current Outpatient Prescriptions  Medication Sig Dispense Refill  . cetirizine (ZYRTEC) 10 MG tablet Take 10 mg by mouth as needed. Reported on 12/31/2014    . Docusate Sodium (COLACE PO) Take 2 tablets by mouth daily.    . fluticasone (FLONASE) 50 MCG/ACT nasal spray Place 2 sprays into both nostrils daily. (Patient taking differently: Place 2  sprays into both nostrils as needed. ) 16 g 6  . ibuprofen (ADVIL,MOTRIN) 200 MG tablet Take 200 mg by mouth every 6 (six) hours as needed.    . metoprolol tartrate (LOPRESSOR) 25 MG tablet Take 25 mg by mouth 2 (two) times daily.    . montelukast (SINGULAIR) 10 MG tablet Take 1 tablet (10 mg total) by mouth at bedtime. (Patient taking differently: Take 10 mg by mouth as needed. ) 30 tablet 3  . pantoprazole (PROTONIX) 40 MG tablet Take 1 tablet (40 mg total) by mouth daily. 30 tablet 5  . pravastatin (PRAVACHOL) 40 MG tablet take 1 tablet by mouth at bedtime 90 tablet 1  . Probiotic Product (PROBIOTIC ADVANCED PO) Take 1 capsule by mouth daily.     . rivaroxaban (XARELTO) 20 MG TABS tablet Take 20 mg by mouth daily with supper.     No current facility-administered medications for this visit.    Review of Systems Review of Systems  Constitutional: Negative.   Respiratory: Negative.   Cardiovascular: Negative.      Blood pressure 130/82, pulse 82, resp. rate 12, height 5\' 5"  (1.651 m), weight 199 lb (90.266 kg).  Physical Exam Physical Exam  Constitutional: She is oriented to person, place, and time. She appears well-developed and well-nourished.  HENT:  Mouth/Throat: Oropharynx is clear and moist.  Eyes: Conjunctivae are normal. No scleral icterus.  Neck: Neck supple.  Cardiovascular: Normal rate, regular rhythm and normal heart sounds.   Pulses:      Femoral pulses are 2+ on the right side, and 2+ on the left side. Pulmonary/Chest: Effort normal and breath sounds normal.  Abdominal: Soft. Normal appearance and bowel sounds are normal. There is tenderness. Hernia confirmed negative in the right inguinal area and confirmed negative in the left inguinal area.  4-5 cm hernia present and tender just above umbilicus.   Lymphadenopathy:    She has no cervical adenopathy.  Neurological: She is alert and oriented to person, place, and time.  Skin: Skin is warm and dry.  Psychiatric: Her behavior is normal.    Data Reviewed Progress notes.  Assessment    Ventral hernia. Likely this is from prior portsite.     Plan   Recommended repair possible use of mesh. Pt is agreeable. She history of cCAD and is on Xarelto. Will get her cardiologist to clear for surgery  . Xarelto to be stopped for 3 days prior to surgery Hernia precautions and incarceration were discussed with the patient. If they develop symptoms of an incarcerated hernia, they were encouraged to seek prompt medical attention.  I have recommended repair of the hernia using mesh on an outpatient basis in the near future. The risk of infection was reviewed. The role of prosthetic mesh to minimize the risk of recurrence was reviewed.       PCP:  Margarita Rana This information has been scribed by Karie Fetch RN, BSN,BC.   SANKAR,SEEPLAPUTHUR G 05/15/2015, 3:45 PM

## 2015-05-26 NOTE — Interval H&P Note (Signed)
History and Physical Interval Note:  05/26/2015 7:05 AM  Kayla Maxwell  has presented today for surgery, with the diagnosis of VENTRAL HERNIA  The various methods of treatment have been discussed with the patient and family. After consideration of risks, benefits and other options for treatment, the patient has consented to  Procedure(s): HERNIA REPAIR VENTRAL ADULT (N/A) as a surgical intervention .  The patient's history has been reviewed, patient examined, no change in status, stable for surgery.  I have reviewed the patient's chart and labs.  Questions were answered to the patient's satisfaction.     SANKAR,SEEPLAPUTHUR G

## 2015-05-26 NOTE — Anesthesia Postprocedure Evaluation (Signed)
Anesthesia Post Note  Patient: Kayla Maxwell  Procedure(s) Performed: Procedure(s) (LRB): HERNIA REPAIR VENTRAL ADULT (N/A)  Patient location during evaluation: PACU Anesthesia Type: General Level of consciousness: awake Pain management: pain level controlled Vital Signs Assessment: post-procedure vital signs reviewed and stable Respiratory status: non-rebreather facemask and spontaneous breathing Cardiovascular status: blood pressure returned to baseline Anesthetic complications: yes Anesthetic complication details: anesthesia complicationsComments: Very difficult intubation!    Last Vitals:  Filed Vitals:   05/26/15 0950 05/26/15 1005  BP: 153/96 170/105  Pulse: 92 72  Temp:    Resp: 19 16    Last Pain:  Filed Vitals:   05/26/15 1008  PainSc: 3                  VAN STAVEREN,Maleeha Halls

## 2015-05-26 NOTE — Anesthesia Preprocedure Evaluation (Signed)
Anesthesia Evaluation  Patient identified by MRN, date of birth, ID band Patient awake    Reviewed: Allergy & Precautions, NPO status , Patient's Chart, lab work & pertinent test results  Airway Mallampati: III       Dental  (+) Teeth Intact   Pulmonary sleep apnea , COPD, former smoker,    breath sounds clear to auscultation       Cardiovascular Exercise Tolerance: Good hypertension, Pt. on home beta blockers + CAD  + dysrhythmias  Rhythm:Regular Rate:Normal     Neuro/Psych Anxiety CVA    GI/Hepatic Neg liver ROS, GERD  Medicated,  Endo/Other  negative endocrine ROS  Renal/GU negative Renal ROS     Musculoskeletal   Abdominal   Peds  Hematology negative hematology ROS (+)   Anesthesia Other Findings   Reproductive/Obstetrics                             Anesthesia Physical Anesthesia Plan  ASA: III  Anesthesia Plan: General   Post-op Pain Management:    Induction: Intravenous  Airway Management Planned: Oral ETT  Additional Equipment:   Intra-op Plan:   Post-operative Plan: Extubation in OR  Informed Consent: I have reviewed the patients History and Physical, chart, labs and discussed the procedure including the risks, benefits and alternatives for the proposed anesthesia with the patient or authorized representative who has indicated his/her understanding and acceptance.     Plan Discussed with: CRNA  Anesthesia Plan Comments:         Anesthesia Quick Evaluation

## 2015-05-26 NOTE — Discharge Instructions (Signed)
AMBULATORY SURGERY  DISCHARGE INSTRUCTIONS   1) The drugs that you were given will stay in your system until tomorrow so for the next 24 hours you should not:  A) Drive an automobile B) Make any legal decisions C) Drink any alcoholic beverage   2) You may resume regular meals tomorrow.  Today it is better to start with liquids and gradually work up to solid foods.  You may eat anything you prefer, but it is better to start with liquids, then soup and crackers, and gradually work up to solid foods.   3) Please notify your doctor immediately if you have any unusual bleeding, trouble breathing, redness and pain at the surgery site, drainage, fever, or pain not relieved by medication.    4) Additional Instructions:        Please contact your physician with any problems or Same Day Surgery at [phone], Monday through Friday 6 am to 4 pm, or [place] at [facility] number at [phone].        Laparoscopic Ventral Hernia Repair Laparoscopic ventral hernia repairis a surgery to fix a ventral hernia. Aventral hernia, also called an incisional hernia, is a bulge of body tissue or intestines that pushes through the front part of the abdomen. This can happen if the connective tissue covering the muscles over the abdomen has a weak spot or is torn because of a surgical cut (incision) from a previous surgery. Laparoscopic ventral hernia repair is often done soon after diagnosis to stop the hernia from getting bigger, becoming uncomfortable, or becoming an emergency. This surgery usually takes about 2 hours, but the time can vary greatly. LET [facility] CARE PROVIDER KNOW ABOUT:  Any allergies you have.  All medicines you are taking, including steroids, vitamins, herbs, eye drops, creams, and over-the-counter medicines.  Previous problems you or members of your family have had with the use of anesthetics.  Any blood disorders you have.  Previous surgeries you have  had.  Medical conditions you have. RISKS AND COMPLICATIONS  Generally, laparoscopic ventral hernia repair is a safe procedure. However, as with any surgical procedure, problems can occur. Possible problems include:  Bleeding.  Trouble passing urine or having a bowel movement after the surgery.  Infection.  Pneumonia.  Blood clots.  Pain in the area of the hernia.  A bulge in the area of the hernia that may be caused by a collection of fluid.  Injury to intestines or other structures in the abdomen.  Return of the hernia after surgery. In some cases, your health care provider may need to stop the laparoscopic procedure and do regular, open surgery. This may be necessary for very difficult hernias, when organs are hard to see, or when bleeding problems occur during surgery. BEFORE THE PROCEDURE   You may need to have blood tests, urine tests, a chest X-ray, or an electrocardiogram done before the day of the surgery.  Ask your health care provider about changing or stopping your regular medicines. This is especially important if you are taking diabetes medicines or blood thinners.  You may need to wash with a special type of germ-killing soap.  Do not eat or drink anything after midnight the night before the procedure or as directed by your health care provider.  Make plans to have someone drive you home after the procedure. PROCEDURE   Small monitors will be put on your body. They are used to check your heart, blood pressure, and oxygen level.  An IV access  tube will be put into a vein in your hand or arm. Fluids and medicine will flow directly into your body through the IV tube.  You will be given medicine that makes you go to sleep (general anesthetic).  Your abdomen will be cleaned with a special soap to kill any germs on your skin.  Once you are asleep, several small incisions will be made in your abdomen.  The large space in your abdomen will be filled with air so  that it expands. This gives your health care provider more room and a better view.  A thin, lighted tube with a tiny camera on the end (laparoscope) is put through a small incision in your abdomen. The camera on the laparoscope sends a picture to a TV screen in the operating room. This gives your health care provider a good view inside your abdomen.  Hollow tubes are put through the other small incisions in your abdomen. The tools needed for the procedure are put through these tubes.  Your health care provider puts the tissue or intestines that formed the hernia back in place.  A screen-like patch (mesh) is used to close the hernia. This helps make the area stronger. Stitches, tacks, or staples are used to keep the mesh in place.  Medicine and a bandage (dressing) or skin glue will be put over the incisions. AFTER THE PROCEDURE   You will stay in a recovery area until the anesthetic wears off. Your blood pressure and pulse will be checked often.  You may be able to go home the same day or may need to stay in the hospital for 1-2 days after surgery. Your health care provider will decide when you can go home.  You may feel some pain. You may be given medicine for pain.  You will be urged to do breathing exercises that involve taking deep breaths. This helps prevent a lung infection after a surgery.  You may have to wear compression stockings while you are in the hospital. These stockings help keep blood clots from forming in your legs.   This information is not intended to replace advice given to you by your health care provider. Make sure you discuss any questions you have with your health care provider.   Document Released: 12/08/2011 Document Revised: 12/26/2012 Document Reviewed: 12/08/2011 Elsevier Interactive Patient Education [employer].

## 2015-05-26 NOTE — OR Nursing (Signed)
Dr Micah Flesher called for N/V-- ordered Phenergan 25 mg tab--given and pt feeling better in 30 min.

## 2015-05-26 NOTE — Op Note (Signed)
Preop diagnosis: Ventral hernia  Post op diagnosis: Ventral hernia incarcerated  Operation: Repair of incarcerated ventral hernia with mesh- 8 cm Ventralex ST  Surgeon: Mckinley Jewel  Assistant:     Anesthesia: Gen.  Complications: None  EBL: Less than 15 mL  Drains: None  Description: This patient was put to sleep with an endotracheal tube. Because of a markedly anterior location of the trachea it took several efforts before tube was satisfactorily positioned in the endotracheal location. The abdomen was prepped and draped as sterile field and timeout performed. Patient had a 4 cm hernia just above the umbilicus and was felt that this may have been from a previous port site. This hernia was not reducible. A vertical skin incision overlying this was made from just above the umbilicus upward about 2 inches in length. The incision was carried down through subcutaneous tissue until the hernial protrusion was identified which was then freed from the surrounding subcutaneous tissue down to the fascia. With exposure further exploration revealed of the patient in fact had incarceration of preperitoneal fat along with a small peritoneal sac the preperitoneal fat was sequentially clamped cut and ligated with the 3-0 Vicryl and the sac was opened to reveal a segmental small bowel tending to come through. With careful exposure the fascial edges were trimmed and adequately control and lifted up and the peritoneal opening was closed with running suture of 2-0 Vicryl. The fascial defect was a 3 cm transversely oriented the defect. Mesh repair was planned. The preperitoneal space was dissected out by lifting up the fascial edge in all directions to allow for placement of a circular 8 cm Ventralex ST mesh. After the dissection was completed the mesh was positioned in the preperitoneal space and the straps lifted up. Axis of the straps were trimmed off. The fascial opening was closed in the same transverse  orientation with figure-of-eight stitches of 5-0 Prolene incorporating the anterior straps. Repair was felt to be satisfactory. The wound was irrigated and then closed in layers the deeper tissues were closed with with interrupted 2-0 Vicryl stitches. Skin was closed with subcuticular 3-0 Monocryl. Steri-Strips with tincture benzoin were applied and dressing was with Telfa and Tegaderm. Patient subsequently was extubated and returned recovery room stable condition. Patient's difficult intubation has been documented and the anesthesiologist the will advise the patient post recovery including a letter to this effect.

## 2015-06-04 DIAGNOSIS — I639 Cerebral infarction, unspecified: Secondary | ICD-10-CM

## 2015-06-04 HISTORY — DX: Cerebral infarction, unspecified: I63.9

## 2015-06-06 ENCOUNTER — Other Ambulatory Visit: Payer: Self-pay | Admitting: Family Medicine

## 2015-06-06 ENCOUNTER — Encounter: Payer: Self-pay | Admitting: Internal Medicine

## 2015-06-06 ENCOUNTER — Inpatient Hospital Stay
Admission: EM | Admit: 2015-06-06 | Discharge: 2015-06-08 | DRG: 066 | Disposition: A | Discharge status: Home / Self Care | Payer: Medicare Other | Attending: Specialist | Admitting: Specialist

## 2015-06-06 ENCOUNTER — Ambulatory Visit (INDEPENDENT_AMBULATORY_CARE_PROVIDER_SITE_OTHER): Payer: Medicare Other

## 2015-06-06 ENCOUNTER — Telehealth: Payer: Self-pay

## 2015-06-06 ENCOUNTER — Emergency Department: Payer: Medicare Other

## 2015-06-06 ENCOUNTER — Inpatient Hospital Stay: Payer: Medicare Other

## 2015-06-06 VITALS — BP 160/90 | HR 89 | Temp 97.5°F | Resp 13

## 2015-06-06 DIAGNOSIS — Z882 Allergy status to sulfonamides status: Secondary | ICD-10-CM | POA: Diagnosis not present

## 2015-06-06 DIAGNOSIS — Z87891 Personal history of nicotine dependence: Secondary | ICD-10-CM | POA: Diagnosis not present

## 2015-06-06 DIAGNOSIS — Z8673 Personal history of transient ischemic attack (TIA), and cerebral infarction without residual deficits: Secondary | ICD-10-CM | POA: Diagnosis not present

## 2015-06-06 DIAGNOSIS — Z820 Family history of epilepsy and other diseases of the nervous system: Secondary | ICD-10-CM

## 2015-06-06 DIAGNOSIS — R350 Frequency of micturition: Secondary | ICD-10-CM | POA: Diagnosis present

## 2015-06-06 DIAGNOSIS — R200 Anesthesia of skin: Secondary | ICD-10-CM | POA: Diagnosis not present

## 2015-06-06 DIAGNOSIS — Z90710 Acquired absence of both cervix and uterus: Secondary | ICD-10-CM | POA: Diagnosis not present

## 2015-06-06 DIAGNOSIS — Z7901 Long term (current) use of anticoagulants: Secondary | ICD-10-CM

## 2015-06-06 DIAGNOSIS — G4730 Sleep apnea, unspecified: Secondary | ICD-10-CM | POA: Diagnosis present

## 2015-06-06 DIAGNOSIS — Z9889 Other specified postprocedural states: Secondary | ICD-10-CM | POA: Diagnosis not present

## 2015-06-06 DIAGNOSIS — K439 Ventral hernia without obstruction or gangrene: Secondary | ICD-10-CM

## 2015-06-06 DIAGNOSIS — I639 Cerebral infarction, unspecified: Secondary | ICD-10-CM | POA: Diagnosis present

## 2015-06-06 DIAGNOSIS — Z880 Allergy status to penicillin: Secondary | ICD-10-CM

## 2015-06-06 DIAGNOSIS — Z8249 Family history of ischemic heart disease and other diseases of the circulatory system: Secondary | ICD-10-CM | POA: Diagnosis not present

## 2015-06-06 DIAGNOSIS — Z800 Family history of malignant neoplasm of digestive organs: Secondary | ICD-10-CM

## 2015-06-06 DIAGNOSIS — I10 Essential (primary) hypertension: Secondary | ICD-10-CM | POA: Diagnosis not present

## 2015-06-06 DIAGNOSIS — Z79899 Other long term (current) drug therapy: Secondary | ICD-10-CM | POA: Diagnosis not present

## 2015-06-06 DIAGNOSIS — I6529 Occlusion and stenosis of unspecified carotid artery: Secondary | ICD-10-CM | POA: Diagnosis not present

## 2015-06-06 DIAGNOSIS — Z886 Allergy status to analgesic agent status: Secondary | ICD-10-CM

## 2015-06-06 DIAGNOSIS — I6522 Occlusion and stenosis of left carotid artery: Secondary | ICD-10-CM | POA: Diagnosis not present

## 2015-06-06 DIAGNOSIS — M549 Dorsalgia, unspecified: Secondary | ICD-10-CM | POA: Diagnosis present

## 2015-06-06 DIAGNOSIS — K219 Gastro-esophageal reflux disease without esophagitis: Secondary | ICD-10-CM | POA: Diagnosis present

## 2015-06-06 DIAGNOSIS — Z8541 Personal history of malignant neoplasm of cervix uteri: Secondary | ICD-10-CM

## 2015-06-06 DIAGNOSIS — I4891 Unspecified atrial fibrillation: Secondary | ICD-10-CM | POA: Diagnosis not present

## 2015-06-06 DIAGNOSIS — I63132 Cerebral infarction due to embolism of left carotid artery: Secondary | ICD-10-CM | POA: Diagnosis not present

## 2015-06-06 DIAGNOSIS — Z9049 Acquired absence of other specified parts of digestive tract: Secondary | ICD-10-CM

## 2015-06-06 DIAGNOSIS — I2510 Atherosclerotic heart disease of native coronary artery without angina pectoris: Secondary | ICD-10-CM | POA: Diagnosis present

## 2015-06-06 DIAGNOSIS — Z7982 Long term (current) use of aspirin: Secondary | ICD-10-CM

## 2015-06-06 DIAGNOSIS — G8929 Other chronic pain: Secondary | ICD-10-CM | POA: Diagnosis present

## 2015-06-06 DIAGNOSIS — E785 Hyperlipidemia, unspecified: Secondary | ICD-10-CM | POA: Diagnosis present

## 2015-06-06 DIAGNOSIS — F419 Anxiety disorder, unspecified: Secondary | ICD-10-CM

## 2015-06-06 DIAGNOSIS — I6359 Cerebral infarction due to unspecified occlusion or stenosis of other cerebral artery: Secondary | ICD-10-CM | POA: Diagnosis not present

## 2015-06-06 HISTORY — DX: Unspecified atrial fibrillation: I48.91

## 2015-06-06 LAB — CBC WITH DIFFERENTIAL/PLATELET
Basophils Absolute: 0.1 10*3/uL (ref 0–0.1)
Basophils Relative: 1 %
Eosinophils Absolute: 0.2 10*3/uL (ref 0–0.7)
Eosinophils Relative: 2 %
HCT: 50 % — ABNORMAL HIGH (ref 35.0–47.0)
Hemoglobin: 16.6 g/dL — ABNORMAL HIGH (ref 12.0–16.0)
Lymphocytes Relative: 26 %
Lymphs Abs: 2.9 10*3/uL (ref 1.0–3.6)
MCH: 29.1 pg (ref 26.0–34.0)
MCHC: 33.1 g/dL (ref 32.0–36.0)
MCV: 87.8 fL (ref 80.0–100.0)
Monocytes Absolute: 0.7 10*3/uL (ref 0.2–0.9)
Monocytes Relative: 6 %
Neutro Abs: 7.3 10*3/uL — ABNORMAL HIGH (ref 1.4–6.5)
Neutrophils Relative %: 65 %
Platelets: 277 10*3/uL (ref 150–440)
RBC: 5.69 MIL/uL — ABNORMAL HIGH (ref 3.80–5.20)
RDW: 13.3 % (ref 11.5–14.5)
WBC: 11.2 10*3/uL — ABNORMAL HIGH (ref 3.6–11.0)

## 2015-06-06 LAB — COMPREHENSIVE METABOLIC PANEL
ALT: 21 U/L (ref 14–54)
AST: 21 U/L (ref 15–41)
Albumin: 4.2 g/dL (ref 3.5–5.0)
Alkaline Phosphatase: 85 U/L (ref 38–126)
Anion gap: 9 (ref 5–15)
BUN: 18 mg/dL (ref 6–20)
CO2: 25 mmol/L (ref 22–32)
Calcium: 9.4 mg/dL (ref 8.9–10.3)
Chloride: 106 mmol/L (ref 101–111)
Creatinine, Ser: 1.14 mg/dL — ABNORMAL HIGH (ref 0.44–1.00)
GFR calc Af Amer: 55 mL/min — ABNORMAL LOW (ref >60)
GFR calc non Af Amer: 48 mL/min — ABNORMAL LOW (ref >60)
Glucose, Bld: 123 mg/dL — ABNORMAL HIGH (ref 65–99)
Potassium: 3.8 mmol/L (ref 3.5–5.1)
Sodium: 140 mmol/L (ref 135–145)
Total Bilirubin: 0.6 mg/dL (ref 0.3–1.2)
Total Protein: 7 g/dL (ref 6.5–8.1)

## 2015-06-06 LAB — TROPONIN I: Troponin I: <0.03 ng/mL (ref <0.031)

## 2015-06-06 LAB — PROTIME-INR
INR: 1.5
Prothrombin Time: 18.2 seconds — ABNORMAL HIGH (ref 11.4–15.0)

## 2015-06-06 LAB — HEMOGLOBIN A1C: Hgb A1c MFr Bld: 5.8 % (ref 4.0–6.0)

## 2015-06-06 LAB — APTT: aPTT: 41 seconds — ABNORMAL HIGH (ref 24–36)

## 2015-06-06 MED ORDER — PRAVASTATIN SODIUM 40 MG PO TABS
40.0000 mg | ORAL_TABLET | Freq: Every day | ORAL | Status: DC
  Administered 2015-06-06: 21:00:00 40 mg via ORAL
  Filled 2015-06-06: qty 1

## 2015-06-06 MED ORDER — TRAMADOL HCL 50 MG PO TABS: 50.0000 mg | ORAL_TABLET | Freq: Four times a day (QID) | ORAL | Status: DC | PRN

## 2015-06-06 MED ORDER — ESCITALOPRAM OXALATE 10 MG PO TABS: 10.0000 mg | ORAL_TABLET | Freq: Every day | ORAL | Status: DC

## 2015-06-06 MED ORDER — ASPIRIN 81 MG PO CHEW
324.0000 mg | CHEWABLE_TABLET | Freq: Once | ORAL | Status: AC
  Administered 2015-06-06: 324 mg via ORAL
  Filled 2015-06-06: qty 4

## 2015-06-06 MED ORDER — MONTELUKAST SODIUM 10 MG PO TABS
10.0000 mg | ORAL_TABLET | Freq: Every day | ORAL | Status: DC
  Administered 2015-06-06 – 2015-06-07 (×2): 10 mg via ORAL
  Filled 2015-06-06 (×2): qty 1

## 2015-06-06 MED ORDER — SODIUM CHLORIDE 0.9% FLUSH
3.0000 mL | Freq: Two times a day (BID) | INTRAVENOUS | Status: DC
  Administered 2015-06-06 – 2015-06-08 (×5): 3 mL via INTRAVENOUS

## 2015-06-06 MED ORDER — IBUPROFEN 600 MG PO TABS: 600.0000 mg | ORAL_TABLET | Freq: Four times a day (QID) | ORAL | Status: DC | PRN

## 2015-06-06 MED ORDER — HYDROCORTISONE 1 % EX CREA
TOPICAL_CREAM | Freq: Three times a day (TID) | CUTANEOUS | Status: DC
  Administered 2015-06-06 – 2015-06-08 (×6): via TOPICAL
  Filled 2015-06-06: qty 28

## 2015-06-06 MED ORDER — PANTOPRAZOLE SODIUM 40 MG PO TBEC
40.0000 mg | DELAYED_RELEASE_TABLET | Freq: Every day | ORAL | Status: DC
  Administered 2015-06-06 – 2015-06-08 (×3): 40 mg via ORAL
  Filled 2015-06-06 (×3): qty 1

## 2015-06-06 MED ORDER — FLUTICASONE PROPIONATE 50 MCG/ACT NA SUSP
2.0000 | Freq: Every day | NASAL | Status: DC
  Administered 2015-06-07 – 2015-06-08 (×2): 2 via NASAL
  Filled 2015-06-06: qty 16

## 2015-06-06 NOTE — ED Notes (Signed)
Pt here after seeing dr Jamal Collin for follow up for hernia surgery last week. Pt states for the past 3 days she has had weakness and numbness to the rt arm, states this am when attempting to get ready she couldn't coordinate her hands to work right. Pt states that she feels like she is off her game, states that her words haven't been coming out right. Something a little off

## 2015-06-06 NOTE — Progress Notes (Signed)
Patient ID: Kayla Maxwell, female   DOB: 04-01-1945, 70 y.o.   MRN: [id number] Patient is here for a wound check of surgical site from her surgery done on 05/26/15. She reports that she started having redness and irritation and pain at her surgical site soon after taking off her dressing on 05/29/15. She states that the area only had very little redness just at the incision on 05/29/15, this worsened over the past 3 days. She reports no drainage, fevers, or chills. . She reports marked improvement in the area last night. The area has  mild localized redness that seems to be related to an adhesive reaction, it is in the shape of the dressing that was removed. No heat is noted in the area. Patient reports very mild tenderness. Surgical incision is intact and edges well approximated. Patient will monitor area this weekend and follow up with Dr Jamal Collin on 06/10/15. She will call with any changes in the area.

## 2015-06-06 NOTE — Progress Notes (Signed)
OT Cancellation Note  Patient Details Name: Kayla Maxwell MRN: [id number] DOB: April 29, 1945   Cancelled Treatment:    Reason Eval/Treat Not Completed: Patient at procedure or test/ unavailable   Harrel Carina, MS, OTR/L   Harrel Carina 06/06/2015, 3:59 PM

## 2015-06-06 NOTE — Progress Notes (Signed)
Pt placed on [facility] CPAP/BIPAP B-2. Pt tolerating well. Unit is plugged into red outlet. Pt is tolerating well.

## 2015-06-06 NOTE — Telephone Encounter (Signed)
Patient's daughter calling requesting an anxiety prescription for Kayla Maxwell once she is discharged from the hospital. [name] reports that Mrs. Lasey was on a medication for anxiety last year and would like the same one. Patient is also requesting a call from Dr. Venia Minks.

## 2015-06-06 NOTE — Telephone Encounter (Signed)
Think she is referring to Escitalopram that we started after her surgery.   Ok to send in. Thanks.

## 2015-06-06 NOTE — ED Notes (Signed)
Patient transported to CT 

## 2015-06-06 NOTE — Telephone Encounter (Signed)
Daughter called stating pt had possible CVA 3 days ago. Is c/o numbness in right arm, balance problems, forgetting how to curl hair, forgetting how to put on gown. Was experiencing H/A in the last 3 days, which has improved. Per verbal orders from Dr. Venia Minks, advised daughter to take pt to ED for evaluation. Renaldo Fiddler, CMA

## 2015-06-06 NOTE — Care Management (Signed)
Admitted to [facility] with the diagnosis of CVA. Post hernia repair 10 days ago. Lives alone. Daughter is [name] [phone]). Dr. Venia Minks is listed as primary care physician.  CT head positive for infarction. Shelbie Ammons RN MSN CCM Care Management [phone]

## 2015-06-06 NOTE — Progress Notes (Signed)
PT Cancellation Note  Patient Details Name: Kayla Maxwell MRN: [id number] DOB: 1945-03-19   Cancelled Treatment:    Reason Eval/Treat Not Completed: Other (comment). Evaluation attempted, however busy with RN in room, preparing to go to ultrasound. Not able to perform therapy. Will re-attempt when available.   Alisson Rozell 06/06/2015, 2:45 PM Greggory Stallion, PT, DPT [phone]

## 2015-06-06 NOTE — ED Provider Notes (Signed)
[facility] Emergency Department Provider Note   ____________________________________________  Time seen: Approximately 10:20 AM  I have reviewed the triage vital signs and the nursing notes.   HISTORY  Chief Complaint Numbness    HPI Kayla Maxwell is a 70 y.o. female history of anticoagulation fibrillation on Xarelto, hyperlipidemia, TIAs, suspect post hernia repair on 05/26/2015 who presents for evaluation of 3 days intermittent right arm numbness and weakness, gradual onset, usually self resolves, no modifying factors. She also notes that this morning while she "could not remember how to curl my hair". No chest pain or difficulty breathing, no nausea, vomiting, diarrhea, fevers or chills.    Past Medical History  Diagnosis Date  . Allergy     takes Singulair daily as needed  . Acid reflux     takes Nexium daily  . Hyperlipidemia     takes Pravastatin daily-started taking this 6 months ago  . Constipation     takes Colace daily as needed  . Headache     sinus   . TIA (transient ischemic attack)     showed up on an MRI but she never knew it  . Chronic back pain     stenosis  . Urinary frequency   . History of blood transfusion   . History of shingles   . Stroke ([place])     TIA'S X 2 --- one last yr and one this yr...lost her memory over 4-6 hrs.  . Dysrhythmia     atrial fibrillation dx 04/2014  . Lumbar surgical wound fluid collection   . Coronary artery disease   . Sleep apnea     CPAP  . Cancer ([place]) AGE 39    CERVICAL cancer with removal  . Anemia   . Complication of anesthesia   . Difficult intubation     Patient Active Problem List   Diagnosis Date Noted  . Abnormal blood sugar 04/11/2015  . Anxiety 11/19/2014  . Contact dermatitis 10/29/2014  . PTSD (post-traumatic stress disorder) 10/29/2014  . Abnormal hemoglobin ([place]) 09/05/2014  . Acid reflux 09/05/2014  . Allergic rhinitis 09/05/2014  . HLD (hyperlipidemia)  09/05/2014  . Disc disorder 09/05/2014  . Mild memory disturbance 09/05/2014  . H/O stroke without residual deficits 09/05/2014  . Cystitis 09/05/2014  . Edema 09/05/2014  . Postoperative CSF leak 08/19/2014  . Spinal stenosis, lumbar 07/04/2014  . Atrial fibrillation, chronic ([place]) 05/08/2014  . MI (mitral incompetence) 05/08/2014  . TI (tricuspid incompetence) 05/08/2014  . Chronic atrial fibrillation ([place]) 05/08/2014  . Compulsive tobacco user syndrome 04/29/2014  . Gastro-esophageal reflux disease without esophagitis 04/29/2014  . Obstructive apnea 04/29/2014  . Atrial fibrillation by electrocardiography ([place]) 04/29/2014  . Neuritis or radiculitis due to rupture of lumbar intervertebral disc 09/21/2013  . DDD (degenerative disc disease), lumbar 08/24/2013  . Bursitis, trochanteric 06/18/2013  . Nerve root inflammation 06/18/2013  . Arthralgia of hip 06/04/2013  . Calculus of gallbladder with other cholecystitis, without mention of obstruction 04/27/2012    Past Surgical History  Procedure Laterality Date  . Cesarean section  1968  . Appendectomy  1963  . Tonsillectomy    . Breast surgery  1984    augementation  . Abdominal hysterectomy    . Colonoscopy    . Esophagogastroduodenoscopy    . Lumbar laminectomy with coflex 1 level Bilateral 07/04/2014    Procedure: Laminectomy and Foraminotomy - bilateral - Lumbar Four-Five with coflex ;  Surgeon: Karie Chimera, MD;  Location: [facility]  NEURO ORS;  Service: Neurosurgery;  Laterality: Bilateral;  . Lumbar wound debridement N/A 08/19/2014    Procedure: Exploration of Lumbar wound;  Surgeon: Karie Chimera, MD;  Location: [place] NEURO ORS;  Service: Neurosurgery;  Laterality: N/A;  . Hammer toe surgery Bilateral   . Back surgery  08/19/2014    Dr. Hal Neer ([place] otho)  . Cholecystectomy  2014    Dr Jamal Collin  . Ventral hernia repair N/A 05/26/2015    Procedure: HERNIA REPAIR VENTRAL ADULT;  Surgeon: Christene Lye, MD;  Location:  [facility] ORS;  Service: General;  Laterality: N/A;    Current Outpatient Rx  Name  Route  Sig  Dispense  Refill  . ibuprofen (ADVIL,MOTRIN) 600 MG tablet   Oral   Take 600 mg by mouth every 6 (six) hours as needed.         . metoprolol tartrate (LOPRESSOR) 25 MG tablet   Oral   Take 25 mg by mouth 2 (two) times daily.         . pravastatin (PRAVACHOL) 40 MG tablet      take 1 tablet by mouth at bedtime   90 tablet   1   . Probiotic Product (PROBIOTIC ADVANCED PO)   Oral   Take 1 capsule by mouth daily.          . rivaroxaban (XARELTO) 20 MG TABS tablet   Oral   Take 20 mg by mouth daily with supper.         . fluticasone (FLONASE) 50 MCG/ACT nasal spray   Each Nare   Place 2 sprays into both nostrils daily. Patient not taking: Reported on 06/06/2015   16 g   6   . montelukast (SINGULAIR) 10 MG tablet   Oral   Take 1 tablet (10 mg total) by mouth at bedtime. Patient not taking: Reported on 06/06/2015   30 tablet   3   . pantoprazole (PROTONIX) 40 MG tablet   Oral   Take 1 tablet (40 mg total) by mouth daily. Patient not taking: Reported on 06/06/2015   30 tablet   5   . traMADol (ULTRAM) 50 MG tablet   Oral   Take 1 tablet (50 mg total) by mouth every 6 (six) hours as needed. Patient not taking: Reported on 06/06/2015   30 tablet   0     Allergies Codeine; Penicillins; Adhesive; and Sulfa antibiotics  Family History  Problem Relation Age of Onset  . Alzheimer's disease Mother   . Heart attack Father   . Pancreatic cancer Sister   . Healthy Brother     Social History Social History  Substance Use Topics  . Smoking status: Former Smoker -- 1.00 packs/day for 45 years    Quit date: 08/19/2014  . Smokeless tobacco: Former Systems developer    Quit date: 08/19/2014  . Alcohol Use: No     Comment: Rarely    Review of Systems Constitutional: No fever/chills Eyes: No visual changes. ENT: No sore throat. Cardiovascular: Denies chest pain. Respiratory: Denies  shortness of breath. Gastrointestinal: No abdominal pain.  No nausea, no vomiting.  No diarrhea.  No constipation. Genitourinary: Negative for dysuria. Musculoskeletal: Negative for back pain. Skin: Negative for rash. Neurological: Negative for headaches, + for focal weakness and numbness in right arm.  10-point ROS otherwise negative.  ____________________________________________   PHYSICAL EXAM:  VITAL SIGNS: ED Triage Vitals  Enc Vitals Group     BP 06/06/15 0940 144/126 mmHg     Pulse  Rate 06/06/15 0940 87     Resp 06/06/15 0940 18     Temp 06/06/15 0940 97.9 F (36.6 C)     Temp Source 06/06/15 0940 Oral     SpO2 06/06/15 0940 100 %     Weight 06/06/15 0940 195 lb (88.451 kg)     Height 06/06/15 0940 5\' 5"  (1.651 m)     Head Cir --      Peak Flow --      Pain Score --      Pain Loc --      Pain Edu? --      Excl. in [place]? --     Constitutional: Alert and oriented. Well appearing and in no acute distress. Eyes: Conjunctivae are normal. PERRL. EOMI. Head: Atraumatic. Nose: No congestion/rhinnorhea. Mouth/Throat: Mucous membranes are moist.  Oropharynx non-erythematous. Neck: No stridor. Supple without meningismus. Cardiovascular: Normal rate, irregular rhythm. Grossly normal heart sounds.  Good peripheral circulation. Respiratory: Normal respiratory effort.  No retractions. Lungs CTAB. Gastrointestinal: Soft and nontender. No distention. No CVA tenderness. Genitourinary: deferred Musculoskeletal: No lower extremity tenderness nor edema.  No joint effusions. Neurologic:  Normal speech and language. No gross focal neurologic deficits are appreciated. 5 out of 5 strength in bilateral upper and lower extremities, sensation intact to light touch throughout, cranial nerves II through XII intact. Skin:  Skin is warm, dry and intact. No rash noted. Psychiatric: Mood and affect are normal. Speech and behavior are normal.  ____________________________________________    LABS (all labs ordered are listed, but only abnormal results are displayed)  Labs Reviewed  CBC WITH DIFFERENTIAL/PLATELET - Abnormal; Notable for the following:    WBC 11.2 (*)    RBC 5.69 (*)    Hemoglobin 16.6 (*)    HCT 50.0 (*)    All other components within normal limits  COMPREHENSIVE METABOLIC PANEL - Abnormal; Notable for the following:    Glucose, Bld 123 (*)    Creatinine, Ser 1.14 (*)    GFR calc non Af Amer 48 (*)    GFR calc Af Amer 55 (*)    All other components within normal limits  PROTIME-INR - Abnormal; Notable for the following:    Prothrombin Time 18.2 (*)    All other components within normal limits  APTT - Abnormal; Notable for the following:    aPTT 41 (*)    All other components within normal limits  TROPONIN I   ____________________________________________  EKG  ED ECG REPORT I, Joanne Gavel, the attending physician, personally viewed and interpreted this ECG.   Date: 06/06/2015  EKG Time: 10:25  Rate: 94  Rhythm:  atrial fibrillation, rate 94  Axis: normal  Intervals:none  ST&T Change: No acute ST elevation. T-wave inversions in II, III, and F aVF as well as V4, V5, V6. T-wave inversions in V4, V5 and V6 seen on the EKG on 04/25/2014. ____________________________________________  RADIOLOGY  CT head  IMPRESSION: New focus of cortical and subcortical low-attenuation in the posterior parietal lobe consistent with subacute infarct. No underlying mass appreciated by CT. MRI examination of the brain could be considered for further detail. ____________________________________________   PROCEDURES  Procedure(s) performed: None  Critical Care performed: No  ____________________________________________   INITIAL IMPRESSION / ASSESSMENT AND PLAN / ED COURSE  Pertinent labs & imaging results that were available during my care of the patient were reviewed by me and considered in my medical decision making (see chart for  details).  Maryia Canard Jenniges is  a 71 y.o. female history of anticoagulation fibrillation on Xarelto, hyperlipidemia, TIAs, suspect post hernia repair on 05/26/2015 who presents for evaluation of 3 days intermittent right arm numbness and weakness. On exam, she is very well-appearing and in no acute distress, vital signs stable she is afebrile. Current NIH stroke scale is 0. Additionally she has anti-quite late on Xarelto and is not a candidate for TPA. My clinical concern is for TIA given her consolation of symptoms. We'll obtain screening labs, CT head, reassess for disposition.  ----------------------------------------- 11:44 AM on 06/06/2015 -----------------------------------------  Data head shows left posterior parietal lobe subacute infarct and I suspect that is the most likely cause of the patient's symptoms. I reviewed her labs, CBC with mild leukocytosis, hemoglobin is mildly elevated at 16.6. CMP with mild creatinine elevation. INR 1.5. Negative troponin. We'll perform swallow screen, give aspirin. case discussed with hospitalist for admission at this time. ____________________________________________   FINAL CLINICAL IMPRESSION(S) / ED DIAGNOSES  Final diagnoses:  Cerebral infarction due to unspecified mechanism      NEW MEDICATIONS STARTED DURING THIS VISIT:  New Prescriptions   No medications on file     Note:  This document was prepared using Dragon voice recognition software and may include unintentional dictation errors.    Joanne Gavel, MD 06/06/15 1145

## 2015-06-06 NOTE — H&P (Signed)
[facility] [place] NAME: Kayla Maxwell    MR#:  [id number]  DATE OF BIRTH:  [birth year]  DATE OF ADMISSION:  06/06/2015  PRIMARY CARE PHYSICIAN: Margarita Rana, MD   REQUESTING/REFERRING PHYSICIAN: Edd Fabian  CHIEF COMPLAINT:   Chief Complaint  Patient presents with  . Numbness    HISTORY OF PRESENT ILLNESS: Kayla Maxwell  is a 70 y.o. female with a known history of Hyperlipidemia, headache, TIA, chronic back pain, urinary frequency, stroke, coronary artery disease, sleep apnea on CPAP, atrial fibrillation - had hernia repair surgery done 10 days ago. She was taking Xarelto as stroke prevention, which was taken off for total 4 days for the surgery. In resume after surgery. For last 2-3 days patient started having complain of numbness and incoordination in her right upper extremity. She noted she was not able to have her clothes on properly or holding things properly from her right hand. She talked to her daughter about this last night and daughter also noted her not able to comprehend the communication she was doing with her, and also little imbalance while she was trying to walk. She had a scheduled appointment today with the surgeon as a postop, when they went to clinic and talk to the nurse about the symptoms she told them to come to emergency room. Her surgical wound is healing fine but she had some allergies to the tape they gave home to apply on the scar and so she is itching around the wound with that.  In ER she was noted to have acute stroke and so given to hospitalist team for further management.  PAST MEDICAL HISTORY:   Past Medical History  Diagnosis Date  . Allergy     takes Singulair daily as needed  . Acid reflux     takes Nexium daily  . Hyperlipidemia     takes Pravastatin daily-started taking this 6 months ago  . Constipation     takes Colace daily as needed  . Headache     sinus   . TIA (transient ischemic attack)     showed up  on an MRI but she never knew it  . Chronic back pain     stenosis  . Urinary frequency   . History of blood transfusion   . History of shingles   . Stroke ([place])     TIA'S X 2 --- one last yr and one this yr...lost her memory over 4-6 hrs.  . Dysrhythmia     atrial fibrillation dx 04/2014  . Lumbar surgical wound fluid collection   . Coronary artery disease   . Sleep apnea     CPAP  . Cancer ([place]) AGE 57    CERVICAL cancer with removal  . Anemia   . Complication of anesthesia   . Difficult intubation     PAST SURGICAL HISTORY: Past Surgical History  Procedure Laterality Date  . Cesarean section  1968  . Appendectomy  1963  . Tonsillectomy    . Breast surgery  1984    augementation  . Abdominal hysterectomy    . Colonoscopy    . Esophagogastroduodenoscopy    . Lumbar laminectomy with coflex 1 level Bilateral 07/04/2014    Procedure: Laminectomy and Foraminotomy - bilateral - Lumbar Four-Five with coflex ;  Surgeon: Karie Chimera, MD;  Location: [place] NEURO ORS;  Service: Neurosurgery;  Laterality: Bilateral;  . Lumbar wound debridement N/A 08/19/2014    Procedure: Exploration of Lumbar wound;  Surgeon: Karie Chimera, MD;  Location: [facility] NEURO ORS;  Service: Neurosurgery;  Laterality: N/A;  . Hammer toe surgery Bilateral   . Back surgery  08/19/2014    Dr. Hal Neer ([place] otho)  . Cholecystectomy  2014    Dr Jamal Collin  . Ventral hernia repair N/A 05/26/2015    Procedure: HERNIA REPAIR VENTRAL ADULT;  Surgeon: Christene Lye, MD;  Location: [facility] ORS;  Service: General;  Laterality: N/A;    SOCIAL HISTORY:  Social History  Substance Use Topics  . Smoking status: Former Smoker -- 1.00 packs/day for 45 years    Quit date: 08/19/2014  . Smokeless tobacco: Former Systems developer    Quit date: 08/19/2014  . Alcohol Use: No     Comment: Rarely    FAMILY HISTORY:  Family History  Problem Relation Age of Onset  . Alzheimer's disease Mother   . Heart attack Father   . Pancreatic  cancer Sister   . Healthy Brother     DRUG ALLERGIES:  Allergies  Allergen Reactions  . Codeine Other (See Comments)    Makes her feel "crazy"  . Penicillins Swelling  . Adhesive [Tape] Hives  . Sulfa Antibiotics Nausea Only and Rash    REVIEW OF SYSTEMS:   CONSTITUTIONAL: No fever, fatigue or weakness.  EYES: No blurred or double vision.  EARS, NOSE, AND THROAT: No tinnitus or ear pain.  RESPIRATORY: No cough, shortness of breath, wheezing or hemoptysis.  CARDIOVASCULAR: No chest pain, orthopnea, edema.  GASTROINTESTINAL: No nausea, vomiting, diarrhea or abdominal pain.  GENITOURINARY: No dysuria, hematuria.  ENDOCRINE: No polyuria, nocturia,  HEMATOLOGY: No anemia, easy bruising or bleeding SKIN: No rash or lesion. MUSCULOSKELETAL: No joint pain or arthritis.   NEUROLOGIC: Numbness and loss of coordination on the right side upper extremity. PSYCHIATRY: No anxiety or depression.   MEDICATIONS AT HOME:  Prior to Admission medications   Medication Sig Start Date End Date Taking? Authorizing Provider  ibuprofen (ADVIL,MOTRIN) 600 MG tablet Take 600 mg by mouth every 6 (six) hours as needed.   Yes Historical Provider, MD  metoprolol tartrate (LOPRESSOR) 25 MG tablet Take 25 mg by mouth 2 (two) times daily.   Yes Historical Provider, MD  pravastatin (PRAVACHOL) 40 MG tablet take 1 tablet by mouth at bedtime 02/28/15  Yes Margarita Rana, MD  Probiotic Product (PROBIOTIC ADVANCED PO) Take 1 capsule by mouth daily.    Yes Historical Provider, MD  rivaroxaban (XARELTO) 20 MG TABS tablet Take 20 mg by mouth daily with supper. 08/27/14  Yes Historical Provider, MD  fluticasone (FLONASE) 50 MCG/ACT nasal spray Place 2 sprays into both nostrils daily. Patient not taking: Reported on 06/06/2015 12/31/14   Margarita Rana, MD  montelukast (SINGULAIR) 10 MG tablet Take 1 tablet (10 mg total) by mouth at bedtime. Patient not taking: Reported on 06/06/2015 01/23/15   Margarita Rana, MD  pantoprazole  (PROTONIX) 40 MG tablet Take 1 tablet (40 mg total) by mouth daily. Patient not taking: Reported on 06/06/2015 03/28/15   Margarita Rana, MD  traMADol (ULTRAM) 50 MG tablet Take 1 tablet (50 mg total) by mouth every 6 (six) hours as needed. Patient not taking: Reported on 06/06/2015 05/26/15   Seeplaputhur Robinette Haines, MD      PHYSICAL EXAMINATION:   VITAL SIGNS: Blood pressure 144/126, pulse 87, temperature 97.9 F (36.6 C), temperature source Oral, resp. rate 18, height 5\' 5"  (1.651 m), weight 88.451 kg (195 lb), SpO2 100 %.  GENERAL:  70 y.o.-year-old patient lying in  the bed with no acute distress.  EYES: Pupils equal, round, reactive to light and accommodation. No scleral icterus. Extraocular muscles intact.  HEENT: Head atraumatic, normocephalic. Oropharynx and nasopharynx clear.  NECK:  Supple, no jugular venous distention. No thyroid enlargement, no tenderness.  LUNGS: Normal breath sounds bilaterally, no wheezing, rales,rhonchi or crepitation. No use of accessory muscles of respiration.  CARDIOVASCULAR: S1, S2 normal. No murmurs, rubs, or gallops.  ABDOMEN: Soft, nontender, nondistended. Bowel sounds present. No organomegaly or mass.  EXTREMITIES: No pedal edema, cyanosis, or clubbing.  NEUROLOGIC: Cranial nerves II through XII are intact. Muscle strength 5/5 in all extremities. Sensation intact. Gait not checked. Finger-nose test and heel to shin test are altered on the right side upper and lower extremities. PSYCHIATRIC: The patient is alert and oriented x 3.  SKIN: No obvious rash, lesion, or ulcer.   LABORATORY PANEL:   CBC  Recent Labs Lab 06/06/15 0952  WBC 11.2*  HGB 16.6*  HCT 50.0*  PLT 277  MCV 87.8  MCH 29.1  MCHC 33.1  RDW 13.3  LYMPHSABS 2.9  MONOABS 0.7  EOSABS 0.2  BASOSABS 0.1   ------------------------------------------------------------------------------------------------------------------  Chemistries   Recent Labs Lab 06/06/15 0952  NA 140  K 3.8   CL 106  CO2 25  GLUCOSE 123*  BUN 18  CREATININE 1.14*  CALCIUM 9.4  AST 21  ALT 21  ALKPHOS 85  BILITOT 0.6   ------------------------------------------------------------------------------------------------------------------ estimated creatinine clearance is 50.5 mL/min (by C-G formula based on Cr of 1.14). ------------------------------------------------------------------------------------------------------------------ No results for input(s): TSH, T4TOTAL, T3FREE, THYROIDAB in the last 72 hours.  Invalid input(s): FREET3   Coagulation profile  Recent Labs Lab 06/06/15 0952  INR 1.50   ------------------------------------------------------------------------------------------------------------------- No results for input(s): DDIMER in the last 72 hours. -------------------------------------------------------------------------------------------------------------------  Cardiac Enzymes  Recent Labs Lab 06/06/15 0952  TROPONINI <0.03   ------------------------------------------------------------------------------------------------------------------ Invalid input(s): POCBNP  ---------------------------------------------------------------------------------------------------------------  Urinalysis    Component Value Date/Time   COLORURINE Yellow 11/09/2013 1155   APPEARANCEUR Turbid* 10/29/2014 0000   APPEARANCEUR Clear 11/09/2013 1155   LABSPEC 1.012 11/09/2013 1155   PHURINE 5.0 11/09/2013 1155   GLUCOSEU Negative 10/29/2014 0000   GLUCOSEU Negative 11/09/2013 1155   HGBUR 1+ 11/09/2013 1155   BILIRUBINUR neg 10/29/2014 1103   BILIRUBINUR Negative 10/29/2014 0000   BILIRUBINUR Negative 11/09/2013 1155   KETONESUR Negative 11/09/2013 1155   PROTEINUR neg 10/29/2014 1103   PROTEINUR Negative 10/29/2014 0000   PROTEINUR Negative 11/09/2013 1155   UROBILINOGEN 0.2 10/29/2014 1103   NITRITE neg 10/29/2014 1103   NITRITE Negative 10/29/2014 0000   NITRITE  Negative 11/09/2013 1155   LEUKOCYTESUR Negative 10/29/2014 1103   LEUKOCYTESUR Trace* 10/29/2014 0000   LEUKOCYTESUR Negative 11/09/2013 1155     RADIOLOGY: Ct Head Wo Contrast  06/06/2015  CLINICAL DATA:  Hernia surgery last week, weakness and numbness to right upper extremity for 3 days, difficulty speaking, history of stroke EXAM: CT HEAD WITHOUT CONTRAST TECHNIQUE: Contiguous axial images were obtained from the base of the skull through the vertex without intravenous contrast. COMPARISON:  07/30/2014 FINDINGS: There is a 2 cm focus of low attenuation involving the IIA left posterior parietal lobe cortex and underlying white matter. There is no hemorrhage or extra-axial fluid. There is no hydrocephalus. Calvarium is intact. IMPRESSION: New focus of cortical and subcortical low-attenuation in the posterior parietal lobe consistent with subacute infarct. No underlying mass appreciated by CT. MRI examination of the brain could be considered for further detail. Electronically Signed  By: Skipper Cliche M.D.   On: 06/06/2015 10:53    EKG: Orders placed or performed during the hospital encounter of 06/06/15  . ED EKG  . ED EKG  . EKG 12-Lead  . EKG 12-Lead    IMPRESSION AND PLAN:  * Acute stroke  Found on CT scan of the head.   Possible reason is holding Xarelto for surgery.   I do not consider this as is her out of failure.   I will call neurology consult to help further manage her anticoagulation.   Get MRA of the brain, ultrasound Doppler of the neck, echocardiogram.   PT and OT evaluation.  * Atrial fibrillation   Rate is controlled currently, hold metoprolol because of acute stroke.   Hold Xarelto for now and let neurologist decide about anticoagulation.  * Hypertension   Currently I will allow permissive hypertension so systolic blood pressure up to 180 he is okay for her.   We may resume metoprolol in 1 or 2 days.  * Hyperlipidemia   Continue statin.  All the records  are reviewed and case discussed with ED provider. Management plans discussed with the patient, family and they are in agreement.  CODE STATUS: Full code Code Status History    Date Active Date Inactive Code Status Order ID Comments User Context   08/19/2014 11:29 AM 08/29/2014  6:25 PM Full Code [id number]  Karie Chimera, MD Inpatient   07/04/2014  1:10 PM 07/05/2014  5:43 PM Full Code [id number]  Karie Chimera, MD Inpatient       TOTAL TIME TAKING CARE OF THIS PATIENT: 50 minutes.  Patient's daughter present in the room.  Vaughan Basta M.D on 06/06/2015   Between 7am to 6pm - Pager - [phone]  After 6pm go to [email or web address] - password [email or web address] [email or web address]  Office  [phone]  CC: Primary care physician; Margarita Rana, MD   Note: This dictation was prepared with Dragon dictation along with smaller phrase technology. Any transcriptional errors that result from this process are unintentional.

## 2015-06-06 NOTE — Plan of Care (Signed)
Problem: Physical Regulation: Goal: Ability to maintain clinical measurements within normal limits will improve Outcome: Progressing Pt admitted today from the ED. NIH 3. A&Ox4. Positive orthostatic BP. Pt denies dizziness and has a steady gait. MD aware. Orthostatics to be checked qx shift.

## 2015-06-07 ENCOUNTER — Inpatient Hospital Stay: Payer: Medicare Other

## 2015-06-07 ENCOUNTER — Inpatient Hospital Stay
Admit: 2015-06-07 | Discharge: 2015-06-07 | Disposition: A | Discharge status: Home / Self Care | Payer: Medicare Other | Attending: Internal Medicine | Admitting: Internal Medicine

## 2015-06-07 ENCOUNTER — Encounter: Payer: Self-pay | Admitting: Radiology

## 2015-06-07 DIAGNOSIS — I63132 Cerebral infarction due to embolism of left carotid artery: Secondary | ICD-10-CM

## 2015-06-07 LAB — BASIC METABOLIC PANEL
Anion gap: 6 (ref 5–15)
BUN: 16 mg/dL (ref 6–20)
CO2: 29 mmol/L (ref 22–32)
Calcium: 8.9 mg/dL (ref 8.9–10.3)
Chloride: 105 mmol/L (ref 101–111)
Creatinine, Ser: 1.07 mg/dL — ABNORMAL HIGH (ref 0.44–1.00)
GFR calc Af Amer: 60 mL/min — ABNORMAL LOW (ref >60)
GFR calc non Af Amer: 51 mL/min — ABNORMAL LOW (ref >60)
Glucose, Bld: 107 mg/dL — ABNORMAL HIGH (ref 65–99)
Potassium: 3.9 mmol/L (ref 3.5–5.1)
Sodium: 140 mmol/L (ref 135–145)

## 2015-06-07 LAB — CBC
HCT: 46.5 % (ref 35.0–47.0)
Hemoglobin: 15.7 g/dL (ref 12.0–16.0)
MCH: 29.6 pg (ref 26.0–34.0)
MCHC: 33.8 g/dL (ref 32.0–36.0)
MCV: 87.6 fL (ref 80.0–100.0)
Platelets: 227 10*3/uL (ref 150–440)
RBC: 5.31 MIL/uL — ABNORMAL HIGH (ref 3.80–5.20)
RDW: 13 % (ref 11.5–14.5)
WBC: 7.8 10*3/uL (ref 3.6–11.0)

## 2015-06-07 MED ORDER — RIVAROXABAN 20 MG PO TABS
20.0000 mg | ORAL_TABLET | Freq: Every day | ORAL | Status: DC
  Administered 2015-06-07: 20 mg via ORAL
  Filled 2015-06-07: qty 1

## 2015-06-07 MED ORDER — ATORVASTATIN CALCIUM 20 MG PO TABS
40.0000 mg | ORAL_TABLET | Freq: Every day | ORAL | Status: DC
  Administered 2015-06-07: 40 mg via ORAL
  Filled 2015-06-07: qty 2

## 2015-06-07 MED ORDER — ASPIRIN EC 81 MG PO TBEC
81.0000 mg | DELAYED_RELEASE_TABLET | Freq: Every day | ORAL | Status: DC
  Administered 2015-06-07 – 2015-06-08 (×2): 81 mg via ORAL
  Filled 2015-06-07 (×2): qty 1

## 2015-06-07 MED ORDER — IOPAMIDOL (ISOVUE-370) INJECTION 76%
75.0000 mL | Freq: Once | INTRAVENOUS | Status: AC | PRN
  Administered 2015-06-07: 09:00:00 75 mL via INTRAVENOUS

## 2015-06-07 MED ORDER — METOPROLOL TARTRATE 25 MG PO TABS
25.0000 mg | ORAL_TABLET | Freq: Two times a day (BID) | ORAL | Status: DC
  Administered 2015-06-07 – 2015-06-08 (×3): 25 mg via ORAL
  Filled 2015-06-07 (×3): qty 1

## 2015-06-07 NOTE — Progress Notes (Signed)
[place] [facility] [place] NAME: Kayla Maxwell    MR#:  [id number]  DATE OF BIRTH:  09-16-1945  SUBJECTIVE:   Patient is here due to right arm numbness. Numbness has significantly improved. CT angio of neck showing high grade/complete occlusion of the left carotid artery at the bifurcation.  MRI of the brain showing acute/subacute nonhemorrhagic infarct of the left posterior frontal lobe and parietal lobe.  No headache or any other associated symptoms presently.  REVIEW OF SYSTEMS:    Review of Systems  Constitutional: Negative for fever and chills.  HENT: Negative for congestion and tinnitus.   Eyes: Negative for blurred vision and double vision.  Respiratory: Negative for cough, shortness of breath and wheezing.   Cardiovascular: Negative for chest pain, orthopnea and PND.  Gastrointestinal: Negative for nausea, vomiting, abdominal pain and diarrhea.  Genitourinary: Negative for dysuria and hematuria.  Neurological: Positive for sensory change. Negative for dizziness and focal weakness.  All other systems reviewed and are negative.   Nutrition: Heart healthy Tolerating Diet: Yes Tolerating PT: Eval noted.       DRUG ALLERGIES:   Allergies  Allergen Reactions  . Codeine Other (See Comments)    Makes her feel "crazy"  . Penicillins Swelling  . Adhesive [Tape] Hives  . Sulfa Antibiotics Nausea Only and Rash    VITALS:  Blood pressure 145/78, pulse 77, temperature 97.4 F (36.3 C), temperature source Oral, resp. rate 20, height 5\' 5"  (1.651 m), weight 88.451 kg (195 lb), SpO2 94 %.  PHYSICAL EXAMINATION:   Physical Exam  GENERAL:  70 y.o.-year-old patient lying in the bed with no acute distress.  EYES: Pupils equal, round, reactive to light and accommodation. No scleral icterus. Extraocular muscles intact.  HEENT: Head atraumatic, normocephalic. Oropharynx and nasopharynx clear.  NECK:  Supple, no jugular venous distention. No  thyroid enlargement, no tenderness.  LUNGS: Normal breath sounds bilaterally, no wheezing, rales, rhonchi. No use of accessory muscles of respiration.  CARDIOVASCULAR: S1, S2 normal. No murmurs, rubs, or gallops.  ABDOMEN: Soft, nontender, nondistended. Bowel sounds present. No organomegaly or mass.  EXTREMITIES: No cyanosis, clubbing or edema b/l.    NEUROLOGIC: Cranial nerves II through XII are intact. No focal Motor or sensory deficits b/l.   PSYCHIATRIC: The patient is alert and oriented x 3.  SKIN: No obvious rash, lesion, or ulcer.    LABORATORY PANEL:   CBC  Recent Labs Lab 06/07/15 0633  WBC 7.8  HGB 15.7  HCT 46.5  PLT 227   ------------------------------------------------------------------------------------------------------------------  Chemistries   Recent Labs Lab 06/06/15 0952 06/07/15 0633  NA 140 140  K 3.8 3.9  CL 106 105  CO2 25 29  GLUCOSE 123* 107*  BUN 18 16  CREATININE 1.14* 1.07*  CALCIUM 9.4 8.9  AST 21  --   ALT 21  --   ALKPHOS 85  --   BILITOT 0.6  --    ------------------------------------------------------------------------------------------------------------------  Cardiac Enzymes  Recent Labs Lab 06/06/15 0952  TROPONINI <0.03   ------------------------------------------------------------------------------------------------------------------  RADIOLOGY:  Ct Head Wo Contrast  06/06/2015  CLINICAL DATA:  Hernia surgery last week, weakness and numbness to right upper extremity for 3 days, difficulty speaking, history of stroke EXAM: CT HEAD WITHOUT CONTRAST TECHNIQUE: Contiguous axial images were obtained from the base of the skull through the vertex without intravenous contrast. COMPARISON:  07/30/2014 FINDINGS: There is a 2 cm focus of low attenuation involving the IIA left posterior parietal lobe cortex  and underlying white matter. There is no hemorrhage or extra-axial fluid. There is no hydrocephalus. Calvarium is intact.  IMPRESSION: New focus of cortical and subcortical low-attenuation in the posterior parietal lobe consistent with subacute infarct. No underlying mass appreciated by CT. MRI examination of the brain could be considered for further detail. Electronically Signed   By: Skipper Cliche M.D.   On: 06/06/2015 10:53   Ct Angio Neck W/cm &/or Wo/cm  06/07/2015  CLINICAL DATA:  Numbness and lack of coordination in the right upper extremity. Weakness in the right hand. EXAM: CT ANGIOGRAPHY NECK TECHNIQUE: Multidetector CT imaging of the neck was performed using the standard protocol during bolus administration of intravenous contrast. Multiplanar CT image reconstructions and MIPs were obtained to evaluate the vascular anatomy. Carotid stenosis measurements (when applicable) are obtained utilizing NASCET criteria, using the distal internal carotid diameter as the denominator. CONTRAST:  Sending 5 mL Isovue 370 COMPARISON:  None. FINDINGS: Aortic arch: A 3 vessel arch configuration is present. There is wall thickening about the origins the great vessels and aortic arch. Right carotid system: A soft tissue plaque extends into the lumen of the innominate artery 4 mm. There is no significant stenosis. The right common carotid artery demonstrates some mural calcification without significant stenosis. There is motion at the level of the carotid bifurcation. Atherosclerotic calcifications are present at the level of the right carotid bifurcation. The study is nondiagnostic just beyond the bifurcation. Normal flow is present on the ultrasound. Mild tortuosity is present in the cervical right ICA. There is a moderate stenosis at the second point tortuosity relative to the more distal vessel. The distal right ICA is normal. Left carotid system: The left common carotid artery is occluded 2 cm from its origin. There is no reconstitution of the left common carotid artery or internal carotid artery in the neck. There is no contrast within  the left internal carotid artery through the visualize cavernous segment. Vertebral arteries:The vertebral arteries originate from the subclavian arteries bilaterally. Mild narrowing is present proximal left subclavian artery without a significant stenosis relative to the more distal vessel. The right vertebral artery is the dominant vessel. Calcifications are present at the origin of the right vertebral artery. There is no significant stenosis of the origin or throughout the course of the right vertebral artery in the neck. The proximal left vertebral artery is not well seen. There is motion artifact and artifact from venous contrast. The more distal left vertebral artery is within normal limits. Vertebrobasilar junction is within normal limits. The visualized proximal basilar artery is unremarkable. Skeleton: Multilevel spondylosis is present in the cervical spine. Uncovertebral spurring leads to multilevel foraminal narrowing from C3-4 through C7-T1, worse on the right. Slight degenerative anterolisthesis and facet hypertrophy is noted at C2-3, worse on the left. Other neck: Centrilobular emphysematous changes are noted. Moderate scarring is present at the lung apices but laterally. No focal mucosal or submucosal lesions are present. The thyroid is somewhat heterogeneous. No significant adenopathy is present. Salivary glands are within normal limits. Limited imaging of the brain is unremarkable. IMPRESSION: 1. The left common carotid artery is occluded 2 cm from the aortic arch. There is no reconstitution through the visualize cavernous segment of the left internal carotid artery. 2. Atherosclerotic changes in the right common carotid artery and at the right carotid bifurcation without definite stenosis. 3. Tortuosity of the right internal carotid artery with a mild to moderate stenosis approximately 3 cm from the right carotid bifurcation. 4. Possible  narrowing of the proximal left vertebral artery before  entering the spinal canal. This is the non dominant vessel. 5. No significant stenosis in the dominant right vertebral artery. 6. Multilevel spondylosis of the cervical spine with osseous foraminal narrowing worse on the right. 7. Moderate centrilobular emphysema. Electronically Signed   By: San Morelle M.D.   On: 06/07/2015 10:39   Mr Jodene Nam Head Wo Contrast  06/07/2015  CLINICAL DATA:  Numbness and incoordination in the right upper extremity. Acute infarct. EXAM: MRI HEAD WITHOUT CONTRAST MRA HEAD WITHOUT CONTRAST TECHNIQUE: Multiplanar, multiecho pulse sequences of the brain and surrounding structures were obtained without intravenous contrast. Angiographic images of the head were obtained using MRA technique without contrast. COMPARISON:  CTA of the neck 06/07/2015. CT head without contrast 06/06/2015. FINDINGS: MRI HEAD FINDINGS Diffusion-weighted images confirm an acute nonhemorrhagic infarct involving vein left precentral and postcentral gyrus. No other acute infarct is present. Extensive T2 changes are associated with the area of acute/subacute infarction. Mild periventricular T2 changes are otherwise within normal limits for age. There is a remote lacunar infarct of the right lentiform nucleus. No other significant white matter disease is present. Minimal white matter changes extend into the brainstem. The cerebellum is within normal limits. Internal auditory canals are normal bilaterally. The left internal carotid artery is occluded. Flow is present in the vertebrobasilar artery and in the right internal carotid artery. The globes and orbits are intact. The left mastoid air cells are clear. There is some fluid in the right mastoid air cells. MRA HEAD FINDINGS The left internal carotid artery is occluded. Mild atherosclerotic changes are noted within the cavernous right internal carotid artery. The right M1 segment is normal. There is significant signal loss in the right A1 segment. There is signal  in both A2 segments with a patent anterior communicating artery. Signal is present in the left A1 segment and left M1 segment. The very distal aspect of the terminal left ICA is noted. There is a almost no signal beyond the left MCA bifurcation. Moderate signal loss is present in the distal right MCA branch vessels. The distal ACA branch vessels are poorly seen. The right vertebral artery is normal. The right PICA origin is not visualized. The left PICA origin is visualized. The distal left vertebral artery is small. The basilar artery is intact. Mild narrowing is present in the right P1 segment. A right posterior communicating artery is present. There is no significant left posterior communicating artery. Asymmetric attenuation of PCA branch vessels is worse on the right. IMPRESSION: 1. Acute/subacute nonhemorrhagic infarct within the posterior left frontal lobe and parietal lobe involving the precentral and postcentral gyrus. 2. Minimal white matter disease otherwise. 3. The left internal carotid artery is occluded essentially through the ICA terminus. 4. The left ACA and MCA fill via a patent anterior communicating artery. 5. Significant signal loss in the right ACA suggests a high-grade stenosis. This is the only significant feeder to the entire left anterior hemisphere. The patient is at high risk for left ACA and MCA a larger infarcts. 6. Poor signal within left MCA branch vessels beyond the bifurcation suggesting decreased perfusion. 7. Moderate distal small vessel disease in the right MCA and bilateral ACA distributions. 8. The right vertebral artery is dominant. The basilar artery is normal. 9. Mild right P1 segment narrowing. 10. A right posterior communicating artery is present but no left posterior communicating artery is present. 11. Asymmetric distal PCA branch vessel disease on the right. Electronically Signed  By: San Morelle M.D.   On: 06/07/2015 12:54   Mr Brain Wo Contrast  06/07/2015   CLINICAL DATA:  Numbness and incoordination in the right upper extremity. Acute infarct. EXAM: MRI HEAD WITHOUT CONTRAST MRA HEAD WITHOUT CONTRAST TECHNIQUE: Multiplanar, multiecho pulse sequences of the brain and surrounding structures were obtained without intravenous contrast. Angiographic images of the head were obtained using MRA technique without contrast. COMPARISON:  CTA of the neck 06/07/2015. CT head without contrast 06/06/2015. FINDINGS: MRI HEAD FINDINGS Diffusion-weighted images confirm an acute nonhemorrhagic infarct involving vein left precentral and postcentral gyrus. No other acute infarct is present. Extensive T2 changes are associated with the area of acute/subacute infarction. Mild periventricular T2 changes are otherwise within normal limits for age. There is a remote lacunar infarct of the right lentiform nucleus. No other significant white matter disease is present. Minimal white matter changes extend into the brainstem. The cerebellum is within normal limits. Internal auditory canals are normal bilaterally. The left internal carotid artery is occluded. Flow is present in the vertebrobasilar artery and in the right internal carotid artery. The globes and orbits are intact. The left mastoid air cells are clear. There is some fluid in the right mastoid air cells. MRA HEAD FINDINGS The left internal carotid artery is occluded. Mild atherosclerotic changes are noted within the cavernous right internal carotid artery. The right M1 segment is normal. There is significant signal loss in the right A1 segment. There is signal in both A2 segments with a patent anterior communicating artery. Signal is present in the left A1 segment and left M1 segment. The very distal aspect of the terminal left ICA is noted. There is a almost no signal beyond the left MCA bifurcation. Moderate signal loss is present in the distal right MCA branch vessels. The distal ACA branch vessels are poorly seen. The right  vertebral artery is normal. The right PICA origin is not visualized. The left PICA origin is visualized. The distal left vertebral artery is small. The basilar artery is intact. Mild narrowing is present in the right P1 segment. A right posterior communicating artery is present. There is no significant left posterior communicating artery. Asymmetric attenuation of PCA branch vessels is worse on the right. IMPRESSION: 1. Acute/subacute nonhemorrhagic infarct within the posterior left frontal lobe and parietal lobe involving the precentral and postcentral gyrus. 2. Minimal white matter disease otherwise. 3. The left internal carotid artery is occluded essentially through the ICA terminus. 4. The left ACA and MCA fill via a patent anterior communicating artery. 5. Significant signal loss in the right ACA suggests a high-grade stenosis. This is the only significant feeder to the entire left anterior hemisphere. The patient is at high risk for left ACA and MCA a larger infarcts. 6. Poor signal within left MCA branch vessels beyond the bifurcation suggesting decreased perfusion. 7. Moderate distal small vessel disease in the right MCA and bilateral ACA distributions. 8. The right vertebral artery is dominant. The basilar artery is normal. 9. Mild right P1 segment narrowing. 10. A right posterior communicating artery is present but no left posterior communicating artery is present. 11. Asymmetric distal PCA branch vessel disease on the right. Electronically Signed   By: San Morelle M.D.   On: 06/07/2015 12:54   US Carotid Bilateral  06/06/2015  CLINICAL DATA:  Stroke EXAM: BILATERAL CAROTID DUPLEX ULTRASOUND TECHNIQUE: Pearline Cables scale imaging, color Doppler and duplex ultrasound were performed of bilateral carotid and vertebral arteries in the neck. COMPARISON:  None. FINDINGS:  Criteria: Quantification of carotid stenosis is based on velocity parameters that correlate the residual internal carotid diameter with  NASCET-based stenosis levels, using the diameter of the distal internal carotid lumen as the denominator for stenosis measurement. The following velocity measurements were obtained: RIGHT ICA:  122/40 cm/sec CCA:  123456 cm/sec SYSTOLIC ICA/CCA RATIO:  0.9 DIASTOLIC ICA/CCA RATIO:  1.1 ECA:  134 cm/sec LEFT Common carotid artery velocities 23 /3 centimeters/seconds. The distal common carotid artery, bowel, internal carotid, and external carotid arteries on the left appear to be completely occluded. The proximal and mid left common carotid artery have little flow with abnormal waveforms noted. RIGHT CAROTID ARTERY: Normal appearance with no plaque RIGHT VERTEBRAL ARTERY:  Antegrade LEFT VERTEBRAL ARTERY:  Antegrade IMPRESSION: Complete occlusion of left carotid artery suspected based on ultrasound. This could be confirmed with CT arteriogram. Electronically Signed   By: Skipper Cliche M.D.   On: 06/06/2015 15:48     ASSESSMENT AND PLAN:   70 yo female hx of a. Fib, hx of previous CVA, hx of CAD, Chronic back pain, GERD, Hyperlipidemia, HTN, came into hospital due to right sided numbness and noted to have an acute CVA.   1. Acute CVA - likely cause of pt's neurologic symptoms.  - MRI brain showing a acute/subacute CVA of the left frontal/parietal lobe. This is consistent with her symptoms of right facial and arm numbness. -Continue Xarelto, will add ASA.  Appreciate neurology input. -Continue PT/OT. -CT angiogram of the neck showing complete occlusion of the left carotid artery with significant disease on the right too but patient has good collateral flow.  2. Carotid artery stenosis-patient noted to have 100% occlusion on the left carotid prior to the bifurcation. -Patient has good full collateral flow from the right. Patient is at high risk for having left-sided MCA stroke. -Await vascular surgery input. Continue statin, Xarelto, aspirin.  3. History of atrial fibrillation-currently rate  controlled. -Continue metoprolol, Xarelto.  4. Essential hypertension-continue metoprolol. Avoid severe hypotension given her high risk for CVA.  5. Hyperlipidemia-continue atorvastatin.  6. GERD-continue Protonix.   All the records are reviewed and case discussed with Care Management/Social Workerr. Management plans discussed with the patient, family and they are in agreement.  CODE STATUS: Full  DVT Prophylaxis: Xarelto  TOTAL TIME TAKING CARE OF THIS PATIENT: 35 minutes.   POSSIBLE D/C IN 1-2 DAYS, DEPENDING ON CLINICAL CONDITION.   Henreitta Leber M.D on 06/07/2015 at 1:39 PM  Between 7am to 6pm - Pager - [phone]  After 6pm go to [email or web address] - password [email or web address] [facility]  [facility]  Office  [phone]  CC: Primary care physician; Margarita Rana, MD

## 2015-06-07 NOTE — Progress Notes (Signed)
CPAP declined. 

## 2015-06-07 NOTE — Progress Notes (Signed)
*  PRELIMINARY RESULTS* Echocardiogram 2D Echocardiogram has been performed.  Kayla Maxwell 06/07/2015, 9:03 AM

## 2015-06-07 NOTE — Evaluation (Signed)
Occupational Therapy Evaluation Patient Details Name: Kayla Maxwell MRN: [id number] DOB: Jul 19, 1945 Today's Date: 06/07/2015    History of Present Illness 70 y.o. female with a known history of Hyperlipidemia, headache, TIA, chronic back pain, urinary frequency, stroke, coronary artery disease, sleep apnea on CPAP, atrial fibrillation - had hernia repair surgery done 05/26/2015, presenting with 3 days intermittent right arm numbness and weakness, gradual onset, usually self resolves, no modifying factors. She also notes that this morning while she "could not remember how to curl my hair". No chest pain or difficulty breathing, no nausea, vomiting, diarrhea, fevers or chills.   Clinical Impression   Pt at baseline per clinical assessment and per pt and friend report. Pt stated "Today, I'm fine." Pt able to perform toileting, dressing, grooming tasks, bed mobility, and functional mobility independently. Pt was slightly impulsive, cued to slow down during sit<>stand transfer and during toilet task. Good family/friend support at home. Pt briefly educated in body positioning to minimize risk of syncopy/falls and pt/friend verbalized understanding. Not additional OT needs at this time.      Follow Up Recommendations  No OT follow up    Equipment Recommendations  None recommended by OT    Recommendations for Other Services       Precautions / Restrictions Precautions Precautions: None Restrictions Weight Bearing Restrictions: No      Mobility Bed Mobility Overal bed mobility: Independent             General bed mobility comments: pt able to perform sup<>sit, sit<>stand indep  Transfers Overall transfer level: Independent Equipment used: None                  Balance Overall balance assessment: Independent                                          ADL Overall ADL's : At baseline                                       General ADL  Comments: Pt able to indep perform toileting, LB/UB dressing, grooming tasks     Vision Vision Assessment?: No apparent visual deficits   Perception     Praxis Praxis Praxis tested?: Within functional limits    Pertinent Vitals/Pain Pain Assessment: No/denies pain     Hand Dominance     Extremity/Trunk Assessment Upper Extremity Assessment Upper Extremity Assessment: Overall WFL for tasks assessed   Lower Extremity Assessment Lower Extremity Assessment: Overall WFL for tasks assessed   Cervical / Trunk Assessment Cervical / Trunk Assessment: Normal   Communication     Cognition Arousal/Alertness: Awake/alert Behavior During Therapy: Impulsive (slightly impulsive during toileting task when went to stand) Overall Cognitive Status: Within Functional Limits for tasks assessed                     General Comments       Exercises       Shoulder Instructions      Home Living Family/patient expects to be discharged to:: Private residence Living Arrangements: Alone Available Help at Discharge: Family;Friend(s) (close friend can provide 24/7 help if needed, daughter living locally can provide occasional help) Type of Home: House Home Access: Stairs to enter [employer] of Steps: "half threshold step" Entrance Stairs-Rails:  None Home Layout: One level     Bathroom Shower/Tub: Tub/shower unit Shower/tub characteristics: Architectural technologist: Standard Bathroom Accessibility: Yes How Accessible: Accessible via walker (per pt best estimation) Home Equipment: Adaptive equipment Adaptive Equipment: Reacher        Prior Functioning/Environment               OT Diagnosis:     OT Problem List:     OT Treatment/Interventions:      OT Goals(Current goals can be found in the care plan section) Acute Rehab OT Goals Patient Stated Goal: go home  OT Frequency:     Barriers to D/C:            Co-evaluation              End of  Session    Activity Tolerance: Patient tolerated treatment well;No increased pain Patient left: in bed;with call bell/phone within reach;with family/visitor present;with SCD's reapplied   Time: [id number] OT Time Calculation (min): 23 min Charges:  OT General Charges $OT Visit: 1 Procedure OT Evaluation $OT Eval Low Complexity: 1 Procedure G-Codes:    Corky Sox, OTR/L 06/07/2015, 11:54 AM

## 2015-06-07 NOTE — Progress Notes (Signed)
PT Cancellation Note  Patient Details Name: Jacquie Bando Dolson MRN: [id number] DOB: [birth year]   Cancelled Treatment:    Reason Eval/Treat Not Completed: Patient at procedure or test/unavailable Attempted X 2 this AM, pt was out of room on both attempts. Will try back later.  Kreg Shropshire 06/07/2015, 11:33 AM

## 2015-06-07 NOTE — Consult Note (Signed)
Reason for Consult: TIA, Carotid occlusion Referring Physician: Dr. Meriel Pica Kayla Maxwell is an 70 y.o. female.  HPI: Pleasant patient with history of TIA and atrial fibrillation on Xarelto. She had a ventral hernia repair on 5/22/2-17 and her Xarelto was held for 4 days in the perioperative period. 2-3 days ago the patient noted numbness and incoordination in her right upper extremity and slurred speech. She denied visual changes, loss of bowel, bladder function, weakness or complete loss of function in her extremities. She states she was otherwise ok. Carotid US and CTA of the head and neck revealed occlusion of the Left CCA/ICA. I was asked to evaluate her for this.  Past Medical History  Diagnosis Date  . Allergy     takes Singulair daily as needed  . Acid reflux     takes Nexium daily  . Hyperlipidemia     takes Pravastatin daily-started taking this 6 months ago  . Constipation     takes Colace daily as needed  . Headache     sinus   . TIA (transient ischemic attack)     showed up on an MRI but she never knew it  . Chronic back pain     stenosis  . Urinary frequency   . History of blood transfusion   . History of shingles   . Stroke ([place])     TIA'S X 2 --- one last yr and one this yr...lost her memory over 4-6 hrs.  . Dysrhythmia     atrial fibrillation dx 04/2014  . Lumbar surgical wound fluid collection   . Coronary artery disease   . Sleep apnea     CPAP  . Cancer ([place]) AGE 58    CERVICAL cancer with removal  . Anemia   . Complication of anesthesia   . Difficult intubation   . Atrial fibrillation [facility])     Past Surgical History  Procedure Laterality Date  . Cesarean section  1968  . Appendectomy  1963  . Tonsillectomy    . Breast surgery  1984    augementation  . Abdominal hysterectomy    . Colonoscopy    . Esophagogastroduodenoscopy    . Lumbar laminectomy with coflex 1 level Bilateral 07/04/2014    Procedure: Laminectomy and Foraminotomy - bilateral -  Lumbar Four-Five with coflex ;  Surgeon: Karie Chimera, MD;  Location: [place] [facility] OR[facility];  Service: Neurosurgery;  Laterality: Bilateral;  . Lumbar wound debridement N/A 08/19/2014    Procedure: Exploration of Lumbar wound;  Surgeon: Karie Chimera, MD;  Location: [place] [facility] OR[facility];  Service: Neurosurgery;  Laterality: N/A;  . Hammer toe surgery Bilateral   . Back surgery  08/19/2014    Dr. Hal Neer ([place] [facility])  . Cholecystectomy  2014    Dr Jamal Collin  . Ventral hernia repair N/A 05/26/2015    Procedure: HERNIA REPAIR VENTRAL ADULT;  Surgeon: Christene Lye, MD;  Location: [facility] ORS;  Service: General;  Laterality: N/A;    Family History  Problem Relation Age of Onset  . Alzheimer's disease Mother   . Heart attack Father   . Pancreatic cancer Sister   . Healthy Brother     Social History:  reports that she quit smoking about 9 months ago. She quit smokeless tobacco use about 9 months ago. She reports that she does not drink alcohol or use illicit drugs.  Allergies:  Allergies  Allergen Reactions  . Codeine Other (See Comments)    Makes her feel "crazy"  .  Penicillins Swelling  . Adhesive [Tape] Hives  . Sulfa Antibiotics Nausea Only and Rash    Medications: I have reviewed the patient's current medications.  Results for orders placed or performed during the hospital encounter of 06/06/15 (from the past 48 hour(s))  CBC with Differential     Status: Abnormal   Collection Time: 06/06/15  9:52 AM  Result Value Ref Range   WBC 11.2 (Kayla) 3.6 - 11.0 K/uL   RBC 5.69 (Kayla) 3.80 - 5.20 MIL/uL   Hemoglobin 16.6 (Kayla) 12.0 - 16.0 g/dL   HCT 50.0 (Kayla) 35.0 - 47.0 %   MCV 87.8 80.0 - 100.0 fL   MCH 29.1 26.0 - 34.0 pg   MCHC 33.1 32.0 - 36.0 g/dL   RDW 13.3 11.5 - 14.5 %   Platelets 277 150 - 440 K/uL   Neutrophils Relative % 65.000000 %   Lymphocytes Relative 26.000000 %   Monocytes Relative 6.000000 %   Eosinophils Relative 2.000000 %   Basophils Relative 1.000000 %   Neutro Abs 7.3 (Kayla)  1.4 - 6.5 K/uL   Lymphs Abs 2.9 1.0 - 3.6 K/uL   Monocytes Absolute 0.7 0.2 - 0.9 K/uL   Eosinophils Absolute 0.2 0 - 0.7 K/uL   Basophils Absolute 0.1 0 - 0.1 K/uL  Comprehensive metabolic panel     Status: Abnormal   Collection Time: 06/06/15  9:52 AM  Result Value Ref Range   Sodium 140 135 - 145 mmol/L   Potassium 3.8 3.5 - 5.1 mmol/L   Chloride 106 101 - 111 mmol/L   CO2 25 22 - 32 mmol/L   Glucose, Bld 123 (Kayla) 65 - 99 mg/dL   BUN 18 6 - 20 mg/dL   Creatinine, Ser 1.14 (Kayla) 0.44 - 1.00 mg/dL   Calcium 9.4 8.9 - 10.3 mg/dL   Total Protein 7.0 6.5 - 8.1 g/dL   Albumin 4.2 3.5 - 5.0 g/dL   AST 21 15 - 41 U/L   ALT 21 14 - 54 U/L   Alkaline Phosphatase 85 38 - 126 U/L   Total Bilirubin 0.6 0.3 - 1.2 mg/dL   GFR calc non Af Amer 48 (L) >60 mL/min   GFR calc Af Amer 55 (L) >60 mL/min    Comment: (NOTE) The eGFR has been calculated using the CKD EPI equation. This calculation has not been validated in all clinical situations. eGFR's persistently <60 mL/min signify possible Chronic Kidney Disease.    Anion gap 9 5 - 15  Troponin I     Status: None   Collection Time: 06/06/15  9:52 AM  Result Value Ref Range   Troponin I <0.03 <0.031 ng/mL    Comment:        NO INDICATION OF MYOCARDIAL INJURY.   Protime-INR     Status: Abnormal   Collection Time: 06/06/15  9:52 AM  Result Value Ref Range   Prothrombin Time 18.2 (Kayla) 11.4 - 15.0 seconds   INR 1.50   APTT     Status: Abnormal   Collection Time: 06/06/15  9:52 AM  Result Value Ref Range   aPTT 41 (Kayla) 24 - 36 seconds    Comment:        IF BASELINE aPTT IS ELEVATED, SUGGEST PATIENT RISK ASSESSMENT BE USED TO DETERMINE APPROPRIATE ANTICOAGULANT THERAPY.   Hemoglobin A1c     Status: None   Collection Time: 06/06/15  9:52 AM  Result Value Ref Range   Hgb A1c MFr Bld 5.8 4.0 - 6.0 %  Basic metabolic panel     Status: Abnormal   Collection Time: 06/07/15  6:33 AM  Result Value Ref Range   Sodium 140 135 - 145 mmol/L    Potassium 3.9 3.5 - 5.1 mmol/L   Chloride 105 101 - 111 mmol/L   CO2 29 22 - 32 mmol/L   Glucose, Bld 107 (Kayla) 65 - 99 mg/dL   BUN 16 6 - 20 mg/dL   Creatinine, Ser 1.07 (Kayla) 0.44 - 1.00 mg/dL   Calcium 8.9 8.9 - 10.3 mg/dL   GFR calc non Af Amer 51 (L) >60 mL/min   GFR calc Af Amer 60 (L) >60 mL/min    Comment: (NOTE) The eGFR has been calculated using the CKD EPI equation. This calculation has not been validated in all clinical situations. eGFR's persistently <60 mL/min signify possible Chronic Kidney Disease.    Anion gap 6 5 - 15  CBC     Status: Abnormal   Collection Time: 06/07/15  6:33 AM  Result Value Ref Range   WBC 7.8 3.6 - 11.0 K/uL   RBC 5.31 (Kayla) 3.80 - 5.20 MIL/uL   Hemoglobin 15.7 12.0 - 16.0 g/dL   HCT 46.5 35.0 - 47.0 %   MCV 87.6 80.0 - 100.0 fL   MCH 29.6 26.0 - 34.0 pg   MCHC 33.8 32.0 - 36.0 g/dL   RDW 13.0 11.5 - 14.5 %   Platelets 227 150 - 440 K/uL    Ct Head Wo Contrast  06/06/2015  CLINICAL DATA:  Hernia surgery last week, weakness and numbness to right upper extremity for 3 days, difficulty speaking, history of stroke EXAM: CT HEAD WITHOUT CONTRAST TECHNIQUE: Contiguous axial images were obtained from the base of the skull through the vertex without intravenous contrast. COMPARISON:  07/30/2014 FINDINGS: There is a 2 cm focus of low attenuation involving the IIA left posterior parietal lobe cortex and underlying white matter. There is no hemorrhage or extra-axial fluid. There is no hydrocephalus. Calvarium is intact. IMPRESSION: New focus of cortical and subcortical low-attenuation in the posterior parietal lobe consistent with subacute infarct. No underlying mass appreciated by CT. MRI examination of the brain could be considered for further detail. Electronically Signed   By: Skipper Cliche M.D.   On: 06/06/2015 10:53   Ct Angio Neck W/cm &/or Wo/cm  06/07/2015  CLINICAL DATA:  Numbness and lack of coordination in the right upper extremity. Weakness in the  right hand. EXAM: CT ANGIOGRAPHY NECK TECHNIQUE: Multidetector CT imaging of the neck was performed using the standard protocol during bolus administration of intravenous contrast. Multiplanar CT image reconstructions and MIPs were obtained to evaluate the vascular anatomy. Carotid stenosis measurements (when applicable) are obtained utilizing NASCET criteria, using the distal internal carotid diameter as the denominator. CONTRAST:  Sending 5 mL Isovue 370 COMPARISON:  None. FINDINGS: Aortic arch: A 3 vessel arch configuration is present. There is wall thickening about the origins the great vessels and aortic arch. Right carotid system: A soft tissue plaque extends into the lumen of the innominate artery 4 mm. There is no significant stenosis. The right common carotid artery demonstrates some mural calcification without significant stenosis. There is motion at the level of the carotid bifurcation. Atherosclerotic calcifications are present at the level of the right carotid bifurcation. The study is nondiagnostic just beyond the bifurcation. Normal flow is present on the ultrasound. Mild tortuosity is present in the cervical right ICA. There is a moderate stenosis at the second point tortuosity relative  to the more distal vessel. The distal right ICA is normal. Left carotid system: The left common carotid artery is occluded 2 cm from its origin. There is no reconstitution of the left common carotid artery or internal carotid artery in the neck. There is no contrast within the left internal carotid artery through the visualize cavernous segment. Vertebral arteries:The vertebral arteries originate from the subclavian arteries bilaterally. Mild narrowing is present proximal left subclavian artery without a significant stenosis relative to the more distal vessel. The right vertebral artery is the dominant vessel. Calcifications are present at the origin of the right vertebral artery. There is no significant stenosis of  the origin or throughout the course of the right vertebral artery in the neck. The proximal left vertebral artery is not well seen. There is motion artifact and artifact from venous contrast. The more distal left vertebral artery is within normal limits. Vertebrobasilar junction is within normal limits. The visualized proximal basilar artery is unremarkable. Skeleton: Multilevel spondylosis is present in the cervical spine. Uncovertebral spurring leads to multilevel foraminal narrowing from C3-4 through C7-T1, worse on the right. Slight degenerative anterolisthesis and facet hypertrophy is noted at C2-3, worse on the left. Other neck: Centrilobular emphysematous changes are noted. Moderate scarring is present at the lung apices but laterally. No focal mucosal or submucosal lesions are present. The thyroid is somewhat heterogeneous. No significant adenopathy is present. Salivary glands are within normal limits. Limited imaging of the brain is unremarkable. IMPRESSION: 1. The left common carotid artery is occluded 2 cm from the aortic arch. There is no reconstitution through the visualize cavernous segment of the left internal carotid artery. 2. Atherosclerotic changes in the right common carotid artery and at the right carotid bifurcation without definite stenosis. 3. Tortuosity of the right internal carotid artery with a mild to moderate stenosis approximately 3 cm from the right carotid bifurcation. 4. Possible narrowing of the proximal left vertebral artery before entering the spinal canal. This is the non dominant vessel. 5. No significant stenosis in the dominant right vertebral artery. 6. Multilevel spondylosis of the cervical spine with osseous foraminal narrowing worse on the right. 7. Moderate centrilobular emphysema. Electronically Signed   By: San Morelle M.D.   On: 06/07/2015 10:39   Mr Jodene Nam Head Wo Contrast  06/07/2015  CLINICAL DATA:  Numbness and incoordination in the right upper extremity.  Acute infarct. EXAM: MRI HEAD WITHOUT CONTRAST MRA HEAD WITHOUT CONTRAST TECHNIQUE: Multiplanar, multiecho pulse sequences of the brain and surrounding structures were obtained without intravenous contrast. Angiographic images of the head were obtained using MRA technique without contrast. COMPARISON:  CTA of the neck 06/07/2015. CT head without contrast 06/06/2015. FINDINGS: MRI HEAD FINDINGS Diffusion-weighted images confirm an acute nonhemorrhagic infarct involving vein left precentral and postcentral gyrus. No other acute infarct is present. Extensive T2 changes are associated with the area of acute/subacute infarction. Mild periventricular T2 changes are otherwise within normal limits for age. There is a remote lacunar infarct of the right lentiform nucleus. No other significant white matter disease is present. Minimal white matter changes extend into the brainstem. The cerebellum is within normal limits. Internal auditory canals are normal bilaterally. The left internal carotid artery is occluded. Flow is present in the vertebrobasilar artery and in the right internal carotid artery. The globes and orbits are intact. The left mastoid air cells are clear. There is some fluid in the right mastoid air cells. MRA HEAD FINDINGS The left internal carotid artery is occluded. Mild atherosclerotic  changes are noted within the cavernous right internal carotid artery. The right M1 segment is normal. There is significant signal loss in the right A1 segment. There is signal in both A2 segments with a patent anterior communicating artery. Signal is present in the left A1 segment and left M1 segment. The very distal aspect of the terminal left ICA is noted. There is a almost no signal beyond the left MCA bifurcation. Moderate signal loss is present in the distal right MCA branch vessels. The distal ACA branch vessels are poorly seen. The right vertebral artery is normal. The right PICA origin is not visualized. The left PICA  origin is visualized. The distal left vertebral artery is small. The basilar artery is intact. Mild narrowing is present in the right P1 segment. A right posterior communicating artery is present. There is no significant left posterior communicating artery. Asymmetric attenuation of PCA branch vessels is worse on the right. IMPRESSION: 1. Acute/subacute nonhemorrhagic infarct within the posterior left frontal lobe and parietal lobe involving the precentral and postcentral gyrus. 2. Minimal white matter disease otherwise. 3. The left internal carotid artery is occluded essentially through the ICA terminus. 4. The left ACA and MCA fill via a patent anterior communicating artery. 5. Significant signal loss in the right ACA suggests a high-grade stenosis. This is the only significant feeder to the entire left anterior hemisphere. The patient is at high risk for left ACA and MCA a larger infarcts. 6. Poor signal within left MCA branch vessels beyond the bifurcation suggesting decreased perfusion. 7. Moderate distal small vessel disease in the right MCA and bilateral ACA distributions. 8. The right vertebral artery is dominant. The basilar artery is normal. 9. Mild right P1 segment narrowing. 10. A right posterior communicating artery is present but no left posterior communicating artery is present. 11. Asymmetric distal PCA branch vessel disease on the right. Electronically Signed   By: San Morelle M.D.   On: 06/07/2015 12:54   Mr Brain Wo Contrast  06/07/2015  CLINICAL DATA:  Numbness and incoordination in the right upper extremity. Acute infarct. EXAM: MRI HEAD WITHOUT CONTRAST MRA HEAD WITHOUT CONTRAST TECHNIQUE: Multiplanar, multiecho pulse sequences of the brain and surrounding structures were obtained without intravenous contrast. Angiographic images of the head were obtained using MRA technique without contrast. COMPARISON:  CTA of the neck 06/07/2015. CT head without contrast 06/06/2015. FINDINGS: MRI  HEAD FINDINGS Diffusion-weighted images confirm an acute nonhemorrhagic infarct involving vein left precentral and postcentral gyrus. No other acute infarct is present. Extensive T2 changes are associated with the area of acute/subacute infarction. Mild periventricular T2 changes are otherwise within normal limits for age. There is a remote lacunar infarct of the right lentiform nucleus. No other significant white matter disease is present. Minimal white matter changes extend into the brainstem. The cerebellum is within normal limits. Internal auditory canals are normal bilaterally. The left internal carotid artery is occluded. Flow is present in the vertebrobasilar artery and in the right internal carotid artery. The globes and orbits are intact. The left mastoid air cells are clear. There is some fluid in the right mastoid air cells. MRA HEAD FINDINGS The left internal carotid artery is occluded. Mild atherosclerotic changes are noted within the cavernous right internal carotid artery. The right M1 segment is normal. There is significant signal loss in the right A1 segment. There is signal in both A2 segments with a patent anterior communicating artery. Signal is present in the left A1 segment and left M1 segment.  The very distal aspect of the terminal left ICA is noted. There is a almost no signal beyond the left MCA bifurcation. Moderate signal loss is present in the distal right MCA branch vessels. The distal ACA branch vessels are poorly seen. The right vertebral artery is normal. The right PICA origin is not visualized. The left PICA origin is visualized. The distal left vertebral artery is small. The basilar artery is intact. Mild narrowing is present in the right P1 segment. A right posterior communicating artery is present. There is no significant left posterior communicating artery. Asymmetric attenuation of PCA branch vessels is worse on the right. IMPRESSION: 1. Acute/subacute nonhemorrhagic infarct  within the posterior left frontal lobe and parietal lobe involving the precentral and postcentral gyrus. 2. Minimal white matter disease otherwise. 3. The left internal carotid artery is occluded essentially through the ICA terminus. 4. The left ACA and MCA fill via a patent anterior communicating artery. 5. Significant signal loss in the right ACA suggests a high-grade stenosis. This is the only significant feeder to the entire left anterior hemisphere. The patient is at high risk for left ACA and MCA a larger infarcts. 6. Poor signal within left MCA branch vessels beyond the bifurcation suggesting decreased perfusion. 7. Moderate distal small vessel disease in the right MCA and bilateral ACA distributions. 8. The right vertebral artery is dominant. The basilar artery is normal. 9. Mild right P1 segment narrowing. 10. A right posterior communicating artery is present but no left posterior communicating artery is present. 11. Asymmetric distal PCA branch vessel disease on the right. Electronically Signed   By: San Morelle M.D.   On: 06/07/2015 12:54   US Carotid Bilateral  06/06/2015  CLINICAL DATA:  Stroke EXAM: BILATERAL CAROTID DUPLEX ULTRASOUND TECHNIQUE: Pearline Cables scale imaging, color Doppler and duplex ultrasound were performed of bilateral carotid and vertebral arteries in the neck. COMPARISON:  None. FINDINGS: Criteria: Quantification of carotid stenosis is based on velocity parameters that correlate the residual internal carotid diameter with NASCET-based stenosis levels, using the diameter of the distal internal carotid lumen as the denominator for stenosis measurement. The following velocity measurements were obtained: RIGHT ICA:  122/40 cm/sec CCA:  161/09 cm/sec SYSTOLIC ICA/CCA RATIO:  0.9 DIASTOLIC ICA/CCA RATIO:  1.1 ECA:  134 cm/sec LEFT Common carotid artery velocities 23 /3 centimeters/seconds. The distal common carotid artery, bowel, internal carotid, and external carotid arteries on the  left appear to be completely occluded. The proximal and mid left common carotid artery have little flow with abnormal waveforms noted. RIGHT CAROTID ARTERY: Normal appearance with no plaque RIGHT VERTEBRAL ARTERY:  Antegrade LEFT VERTEBRAL ARTERY:  Antegrade IMPRESSION: Complete occlusion of left carotid artery suspected based on ultrasound. This could be confirmed with CT arteriogram. Electronically Signed   By: Skipper Cliche M.D.   On: 06/06/2015 15:48    Review of Systems  Constitutional: Negative.   Eyes: Negative for blurred vision and double vision.  Respiratory: Negative for cough and shortness of breath.   Cardiovascular: Negative for chest pain, palpitations, orthopnea, claudication and leg swelling.  Gastrointestinal: Negative for nausea, vomiting and abdominal pain.  Genitourinary: Negative.   Musculoskeletal: Negative for myalgias and neck pain.  Skin: Negative.   Neurological: Positive for tingling, speech change and focal weakness. Negative for dizziness, tremors and seizures.  Endo/Heme/Allergies: Negative.   Psychiatric/Behavioral: Negative.    Blood pressure 145/78, pulse 77, temperature 97.4 F (36.3 C), temperature source Oral, resp. rate 20, height '5\' 5"'  (1.651 m), weight 88.451  kg (195 lb), SpO2 94 %. Physical Exam  Constitutional: She is oriented to person, place, and time. She appears well-developed and well-nourished.  HENT:  Head: Normocephalic and atraumatic.  Eyes: Pupils are equal, round, and reactive to light.  Neck: Normal range of motion. Neck supple. No thyromegaly present.  Cardiovascular: Normal rate, regular rhythm, normal heart sounds and intact distal pulses.   Respiratory: Effort normal and breath sounds normal.  GI: Soft. She exhibits no distension. There is no tenderness. There is no rebound and no guarding.  Musculoskeletal: Normal range of motion.  Strength 5/5 bilateral upper and lower extremities  Neurological: She is alert and oriented to  person, place, and time. No cranial nerve deficit.  Speech clear, fluent. No slurring.  Skin: Skin is warm and dry.  Psychiatric: She has a normal mood and affect. Her behavior is normal.    Assessment/Plan: Left Carotid Occlusion  No intervention for the left carotid occlusion.  The right carotid has minimal disease and based upon the velocities on [place] is normal.  Continue care per Cardiology- Xarelto Daily ASA Encouraged lifestyle modifications- diet/exercise.    Railey Glad A 06/07/2015, 1:29 PM

## 2015-06-07 NOTE — Evaluation (Signed)
Physical Therapy Evaluation Patient Details Name: Kayla Maxwell MRN: [id number] DOB: [birth year] Today's Date: 06/07/2015   History of Present Illness  70 y/o female here with R sided numbness and weakness. Pt does have h/o CVA.  Clinical Impression  Pt is able to ambulate around the nurses' station w/o issue.  She has good confidence, speed and safety and overall did not have any issues and no significant fatigue.  She is able to get to EOB and standing w/o assist and overall reports feeling ready to go home.  Pt does not require further PT intervention at this time.     Follow Up Recommendations No PT follow up    Equipment Recommendations       Recommendations for Other Services       Precautions / Restrictions Precautions Precautions: None Restrictions Weight Bearing Restrictions: No      Mobility  Bed Mobility Overal bed mobility: Independent                Transfers Overall transfer level: Independent                  Ambulation/Gait Ambulation/Gait assistance: Modified independent (Device/Increase time) Ambulation Distance (Feet): 200 Feet Assistive device: None          Stairs            Wheelchair Mobility    Modified Rankin (Stroke Patients Only)       Balance Overall balance assessment: Independent                                           Pertinent Vitals/Pain Pain Assessment: No/denies pain    Home Living Family/patient expects to be discharged to:: Private residence Living Arrangements: Alone Available Help at Discharge: Family;Friend(s) Type of Home: House Home Access: Stairs to enter   [employer] of Steps: "half threshold step"   Home Equipment: Walker - 2 wheels      Prior Function Level of Independence: Independent               Hand Dominance        Extremity/Trunk Assessment   Upper Extremity Assessment: Overall WFL for tasks assessed           Lower  Extremity Assessment: Overall WFL for tasks assessed         Communication   Communication: No difficulties  Cognition Arousal/Alertness: Awake/alert Behavior During Therapy: WFL for tasks assessed/performed Overall Cognitive Status: Within Functional Limits for tasks assessed                      General Comments      Exercises        Assessment/Plan    PT Assessment Patent does not need any further PT services  PT Diagnosis Generalized weakness   PT Problem List    PT Treatment Interventions     PT Goals (Current goals can be found in the Care Plan section) Acute Rehab PT Goals Patient Stated Goal: go home    Frequency     Barriers to discharge        Co-evaluation               End of Session Equipment Utilized During Treatment: Gait belt Activity Tolerance: Patient tolerated treatment well Patient left: in chair;with call bell/phone within reach;with family/visitor present Nurse Communication: Mobility status  Time: [id number] PT Time Calculation (min) (ACUTE ONLY): 24 min   Charges:   PT Evaluation $PT Eval Low Complexity: 1 Procedure     PT G Codes:        Kayla Maxwell, DPT  06/07/2015, 4:08 PM

## 2015-06-07 NOTE — Consult Note (Addendum)
CC: R sided weakness   HPI: Kayla Maxwell is an 70 y.o. female with a known history of Hyperlipidemia, headache, TIA, chronic back pain, urinary frequency, stroke, coronary artery disease, sleep apnea on CPAP, atrial fibrillation - had hernia repair surgery done 05/26/2015, presenting with 3 days intermittent right arm numbness and weakness, gradual onset, usually self resolves, no modifying factors.  Symptoms improved. Pt was off xarelto for 4 days.    Past Medical History  Diagnosis Date  . Allergy     takes Singulair daily as needed  . Acid reflux     takes Nexium daily  . Hyperlipidemia     takes Pravastatin daily-started taking this 6 months ago  . Constipation     takes Colace daily as needed  . Headache     sinus   . TIA (transient ischemic attack)     showed up on an MRI but she never knew it  . Chronic back pain     stenosis  . Urinary frequency   . History of blood transfusion   . History of shingles   . Stroke ([place])     TIA'S X 2 --- one last yr and one this yr...lost her memory over 4-6 hrs.  . Dysrhythmia     atrial fibrillation dx 04/2014  . Lumbar surgical wound fluid collection   . Coronary artery disease   . Sleep apnea     CPAP  . Cancer ([place]) AGE 29    CERVICAL cancer with removal  . Anemia   . Complication of anesthesia   . Difficult intubation   . Atrial fibrillation [facility])     Past Surgical History  Procedure Laterality Date  . Cesarean section  1968  . Appendectomy  1963  . Tonsillectomy    . Breast surgery  1984    augementation  . Abdominal hysterectomy    . Colonoscopy    . Esophagogastroduodenoscopy    . Lumbar laminectomy with coflex 1 level Bilateral 07/04/2014    Procedure: Laminectomy and Foraminotomy - bilateral - Lumbar Four-Five with coflex ;  Surgeon: Karie Chimera, MD;  Location: [place] [facility];  Service: Neurosurgery;  Laterality: Bilateral;  . Lumbar wound debridement N/A 08/19/2014    Procedure: Exploration of Lumbar wound;   Surgeon: Karie Chimera, MD;  Location: [place] [facility];  Service: Neurosurgery;  Laterality: N/A;  . Hammer toe surgery Bilateral   . Back surgery  08/19/2014    Dr. Hal Neer ([place] [facility])  . Cholecystectomy  2014    Dr Jamal Collin  . Ventral hernia repair N/A 05/26/2015    Procedure: HERNIA REPAIR VENTRAL ADULT;  Surgeon: Christene Lye, MD;  Location: [facility] ORS;  Service: General;  Laterality: N/A;    Family History  Problem Relation Age of Onset  . Alzheimer's disease Mother   . Heart attack Father   . Pancreatic cancer Sister   . Healthy Brother     Social History:  reports that she quit smoking about 9 months ago. She quit smokeless tobacco use about 9 months ago. She reports that she does not drink alcohol or use illicit drugs.  Allergies  Allergen Reactions  . Codeine Other (See Comments)    Makes her feel "crazy"  . Penicillins Swelling  . Adhesive [Tape] Hives  . Sulfa Antibiotics Nausea Only and Rash    Medications: I have reviewed the patient's current medications.  ROS: History obtained from the patient  General ROS: negative for - chills, fatigue, fever, night  sweats, weight gain or weight loss Psychological ROS: negative for - behavioral disorder, hallucinations, memory difficulties, mood swings or suicidal ideation Ophthalmic ROS: negative for - blurry vision, double vision, eye pain or loss of vision ENT ROS: negative for - epistaxis, nasal discharge, oral lesions, sore throat, tinnitus or vertigo Allergy and Immunology ROS: negative for - hives or itchy/watery eyes Hematological and Lymphatic ROS: negative for - bleeding problems, bruising or swollen lymph nodes Endocrine ROS: negative for - galactorrhea, hair pattern changes, polydipsia/polyuria or temperature intolerance Respiratory ROS: negative for - cough, hemoptysis, shortness of breath or wheezing Cardiovascular ROS: negative for - chest pain, dyspnea on exertion, edema or irregular  heartbeat Gastrointestinal ROS: negative for - abdominal pain, diarrhea, hematemesis, nausea/vomiting or stool incontinence Genito-Urinary ROS: negative for - dysuria, hematuria, incontinence or urinary frequency/urgency Musculoskeletal ROS: negative for - joint swelling or muscular weakness Neurological ROS: as noted in HPI Dermatological ROS: negative for rash and skin lesion changes  Physical Examination: Blood pressure 145/78, pulse 77, temperature 97.4 F (36.3 C), temperature source Oral, resp. rate 20, height 5\' 5"  (1.651 m), weight 88.451 kg (195 lb), SpO2 94 %.  HEENT-  Normocephalic, no lesions, without obvious abnormality.  Normal external eye and conjunctiva.  Normal TM's bilaterally.  Normal auditory canals and external ears. Normal external nose, mucus membranes and septum.  Normal pharynx.   Neurological Examination Mental Status: Alert, oriented, thought content appropriate.  Speech fluent without evidence of aphasia.  Able to follow 3 step commands without difficulty. Cranial Nerves: II: Discs flat bilaterally; Visual fields grossly normal, pupils equal, round, reactive to light and accommodation III,IV, VI: ptosis not present, extra-ocular motions intact bilaterally V,VII: smile symmetric, facial light touch sensation normal bilaterally VIII: hearing normal bilaterally IX,X: gag reflex present XI: bilateral shoulder shrug XII: midline tongue extension Motor: Right : Upper extremity   4+/5    Left:     Upper extremity   5/5  Lower extremity   4+/5     Lower extremity   5/5 Tone and bulk:normal tone throughout; no atrophy noted Sensory: Pinprick and light touch intact throughout, bilaterally Deep Tendon Reflexes: 1+ and symmetric throughout Plantars: Right: downgoing   Left: downgoing Cerebellar: normal finger-to-nose, normal rapid alternating movements and normal heel-to-shin test Gait: not tested.       Laboratory Studies:   Basic Metabolic Panel:  Recent  Labs Lab 06/06/15 0952 06/07/15 0633  NA 140 140  K 3.8 3.9  CL 106 105  CO2 25 29  GLUCOSE 123* 107*  BUN 18 16  CREATININE 1.14* 1.07*  CALCIUM 9.4 8.9    Liver Function Tests:  Recent Labs Lab 06/06/15 0952  AST 21  ALT 21  ALKPHOS 85  BILITOT 0.6  PROT 7.0  ALBUMIN 4.2   No results for input(s): LIPASE, AMYLASE in the last 168 hours. No results for input(s): AMMONIA in the last 168 hours.  CBC:  Recent Labs Lab 06/06/15 0952 06/07/15 0633  WBC 11.2* 7.8  NEUTROABS 7.3*  --   HGB 16.6* 15.7  HCT 50.0* 46.5  MCV 87.8 87.6  PLT 277 227    Cardiac Enzymes:  Recent Labs Lab 06/06/15 0952  TROPONINI <0.03    BNP: Invalid input(s): POCBNP  CBG: No results for input(s): GLUCAP in the last 168 hours.  Microbiology: Results for orders placed or performed in visit on 09/05/14  Urine culture     Status: Abnormal   Collection Time: 09/05/14 12:00 AM  Result Value Ref  Range Status   Urine Culture, Routine Final report (A)  Final   Urine Culture result 1 Klebsiella pneumoniae (A)  Final    Comment: Greater than 100,000 colony forming units per mL   ANTIMICROBIAL SUSCEPTIBILITY Comment  Final    Comment:       ** S = Susceptible; I = Intermediate; R = Resistant **                    P = Positive; N = Negative             MICS are expressed in micrograms per mL    Antibiotic                 RSLT#1    RSLT#2    RSLT#3    RSLT#4 Amoxicillin/Clavulanic Acid    S Ampicillin                     R Cefepime                       S Ceftriaxone                    S Cefuroxime                     S Cephalothin                    S Ciprofloxacin                  S Ertapenem                      S Gentamicin                     S Imipenem                       S Levofloxacin                   S Nitrofurantoin                 S Piperacillin                   R Tetracycline                   S Tobramycin                     S Trimethoprim/Sulfa              S     Coagulation Studies:  Recent Labs  06/06/15 0952  LABPROT 18.2*  INR 1.50    Urinalysis: No results for input(s): COLORURINE, LABSPEC, PHURINE, GLUCOSEU, HGBUR, BILIRUBINUR, KETONESUR, PROTEINUR, UROBILINOGEN, NITRITE, LEUKOCYTESUR in the last 168 hours.  Invalid input(s): APPERANCEUR  Lipid Panel:     Component Value Date/Time   CHOL 201* 11/09/2013 1155   TRIG 195 11/09/2013 1155   HDL 45 11/09/2013 1155   VLDL 39 11/09/2013 1155   LDLCALC 117* 11/09/2013 1155    HgbA1C:  Lab Results  Component Value Date   HGBA1C 5.8 06/06/2015    Urine Drug Screen:  No results found for: LABOPIA, COCAINSCRNUR, LABBENZ, AMPHETMU, THCU, LABBARB  Alcohol Level: No results for input(s): ETH in the last 168 hours.  Other results: EKG: a-fib Imaging: Ct Head Wo  Contrast  06/06/2015  CLINICAL DATA:  Hernia surgery last week, weakness and numbness to right upper extremity for 3 days, difficulty speaking, history of stroke EXAM: CT HEAD WITHOUT CONTRAST TECHNIQUE: Contiguous axial images were obtained from the base of the skull through the vertex without intravenous contrast. COMPARISON:  07/30/2014 FINDINGS: There is a 2 cm focus of low attenuation involving the IIA left posterior parietal lobe cortex and underlying white matter. There is no hemorrhage or extra-axial fluid. There is no hydrocephalus. Calvarium is intact. IMPRESSION: New focus of cortical and subcortical low-attenuation in the posterior parietal lobe consistent with subacute infarct. No underlying mass appreciated by CT. MRI examination of the brain could be considered for further detail. Electronically Signed   By: Skipper Cliche M.D.   On: 06/06/2015 10:53   Ct Angio Neck W/cm &/or Wo/cm  06/07/2015  CLINICAL DATA:  Numbness and lack of coordination in the right upper extremity. Weakness in the right hand. EXAM: CT ANGIOGRAPHY NECK TECHNIQUE: Multidetector CT imaging of the neck was performed using the standard protocol  during bolus administration of intravenous contrast. Multiplanar CT image reconstructions and MIPs were obtained to evaluate the vascular anatomy. Carotid stenosis measurements (when applicable) are obtained utilizing NASCET criteria, using the distal internal carotid diameter as the denominator. CONTRAST:  Sending 5 mL Isovue 370 COMPARISON:  None. FINDINGS: Aortic arch: A 3 vessel arch configuration is present. There is wall thickening about the origins the great vessels and aortic arch. Right carotid system: A soft tissue plaque extends into the lumen of the innominate artery 4 mm. There is no significant stenosis. The right common carotid artery demonstrates some mural calcification without significant stenosis. There is motion at the level of the carotid bifurcation. Atherosclerotic calcifications are present at the level of the right carotid bifurcation. The study is nondiagnostic just beyond the bifurcation. Normal flow is present on the ultrasound. Mild tortuosity is present in the cervical right ICA. There is a moderate stenosis at the second point tortuosity relative to the more distal vessel. The distal right ICA is normal. Left carotid system: The left common carotid artery is occluded 2 cm from its origin. There is no reconstitution of the left common carotid artery or internal carotid artery in the neck. There is no contrast within the left internal carotid artery through the visualize cavernous segment. Vertebral arteries:The vertebral arteries originate from the subclavian arteries bilaterally. Mild narrowing is present proximal left subclavian artery without a significant stenosis relative to the more distal vessel. The right vertebral artery is the dominant vessel. Calcifications are present at the origin of the right vertebral artery. There is no significant stenosis of the origin or throughout the course of the right vertebral artery in the neck. The proximal left vertebral artery is not well  seen. There is motion artifact and artifact from venous contrast. The more distal left vertebral artery is within normal limits. Vertebrobasilar junction is within normal limits. The visualized proximal basilar artery is unremarkable. Skeleton: Multilevel spondylosis is present in the cervical spine. Uncovertebral spurring leads to multilevel foraminal narrowing from C3-4 through C7-T1, worse on the right. Slight degenerative anterolisthesis and facet hypertrophy is noted at C2-3, worse on the left. Other neck: Centrilobular emphysematous changes are noted. Moderate scarring is present at the lung apices but laterally. No focal mucosal or submucosal lesions are present. The thyroid is somewhat heterogeneous. No significant adenopathy is present. Salivary glands are within normal limits. Limited imaging of the brain is unremarkable. IMPRESSION: 1.  The left common carotid artery is occluded 2 cm from the aortic arch. There is no reconstitution through the visualize cavernous segment of the left internal carotid artery. 2. Atherosclerotic changes in the right common carotid artery and at the right carotid bifurcation without definite stenosis. 3. Tortuosity of the right internal carotid artery with a mild to moderate stenosis approximately 3 cm from the right carotid bifurcation. 4. Possible narrowing of the proximal left vertebral artery before entering the spinal canal. This is the non dominant vessel. 5. No significant stenosis in the dominant right vertebral artery. 6. Multilevel spondylosis of the cervical spine with osseous foraminal narrowing worse on the right. 7. Moderate centrilobular emphysema. Electronically Signed   By: San Morelle M.D.   On: 06/07/2015 10:39   Mr Jodene Nam Head Wo Contrast  06/07/2015  CLINICAL DATA:  Numbness and incoordination in the right upper extremity. Acute infarct. EXAM: MRI HEAD WITHOUT CONTRAST MRA HEAD WITHOUT CONTRAST TECHNIQUE: Multiplanar, multiecho pulse sequences of  the brain and surrounding structures were obtained without intravenous contrast. Angiographic images of the head were obtained using MRA technique without contrast. COMPARISON:  CTA of the neck 06/07/2015. CT head without contrast 06/06/2015. FINDINGS: MRI HEAD FINDINGS Diffusion-weighted images confirm an acute nonhemorrhagic infarct involving vein left precentral and postcentral gyrus. No other acute infarct is present. Extensive T2 changes are associated with the area of acute/subacute infarction. Mild periventricular T2 changes are otherwise within normal limits for age. There is a remote lacunar infarct of the right lentiform nucleus. No other significant white matter disease is present. Minimal white matter changes extend into the brainstem. The cerebellum is within normal limits. Internal auditory canals are normal bilaterally. The left internal carotid artery is occluded. Flow is present in the vertebrobasilar artery and in the right internal carotid artery. The globes and orbits are intact. The left mastoid air cells are clear. There is some fluid in the right mastoid air cells. MRA HEAD FINDINGS The left internal carotid artery is occluded. Mild atherosclerotic changes are noted within the cavernous right internal carotid artery. The right M1 segment is normal. There is significant signal loss in the right A1 segment. There is signal in both A2 segments with a patent anterior communicating artery. Signal is present in the left A1 segment and left M1 segment. The very distal aspect of the terminal left ICA is noted. There is a almost no signal beyond the left MCA bifurcation. Moderate signal loss is present in the distal right MCA branch vessels. The distal ACA branch vessels are poorly seen. The right vertebral artery is normal. The right PICA origin is not visualized. The left PICA origin is visualized. The distal left vertebral artery is small. The basilar artery is intact. Mild narrowing is present in  the right P1 segment. A right posterior communicating artery is present. There is no significant left posterior communicating artery. Asymmetric attenuation of PCA branch vessels is worse on the right. IMPRESSION: 1. Acute/subacute nonhemorrhagic infarct within the posterior left frontal lobe and parietal lobe involving the precentral and postcentral gyrus. 2. Minimal white matter disease otherwise. 3. The left internal carotid artery is occluded essentially through the ICA terminus. 4. The left ACA and MCA fill via a patent anterior communicating artery. 5. Significant signal loss in the right ACA suggests a high-grade stenosis. This is the only significant feeder to the entire left anterior hemisphere. The patient is at high risk for left ACA and MCA a larger infarcts. 6. Poor signal within left  MCA branch vessels beyond the bifurcation suggesting decreased perfusion. 7. Moderate distal small vessel disease in the right MCA and bilateral ACA distributions. 8. The right vertebral artery is dominant. The basilar artery is normal. 9. Mild right P1 segment narrowing. 10. A right posterior communicating artery is present but no left posterior communicating artery is present. 11. Asymmetric distal PCA branch vessel disease on the right. Electronically Signed   By: San Morelle M.D.   On: 06/07/2015 12:54   Mr Brain Wo Contrast  06/07/2015  CLINICAL DATA:  Numbness and incoordination in the right upper extremity. Acute infarct. EXAM: MRI HEAD WITHOUT CONTRAST MRA HEAD WITHOUT CONTRAST TECHNIQUE: Multiplanar, multiecho pulse sequences of the brain and surrounding structures were obtained without intravenous contrast. Angiographic images of the head were obtained using MRA technique without contrast. COMPARISON:  CTA of the neck 06/07/2015. CT head without contrast 06/06/2015. FINDINGS: MRI HEAD FINDINGS Diffusion-weighted images confirm an acute nonhemorrhagic infarct involving vein left precentral and  postcentral gyrus. No other acute infarct is present. Extensive T2 changes are associated with the area of acute/subacute infarction. Mild periventricular T2 changes are otherwise within normal limits for age. There is a remote lacunar infarct of the right lentiform nucleus. No other significant white matter disease is present. Minimal white matter changes extend into the brainstem. The cerebellum is within normal limits. Internal auditory canals are normal bilaterally. The left internal carotid artery is occluded. Flow is present in the vertebrobasilar artery and in the right internal carotid artery. The globes and orbits are intact. The left mastoid air cells are clear. There is some fluid in the right mastoid air cells. MRA HEAD FINDINGS The left internal carotid artery is occluded. Mild atherosclerotic changes are noted within the cavernous right internal carotid artery. The right M1 segment is normal. There is significant signal loss in the right A1 segment. There is signal in both A2 segments with a patent anterior communicating artery. Signal is present in the left A1 segment and left M1 segment. The very distal aspect of the terminal left ICA is noted. There is a almost no signal beyond the left MCA bifurcation. Moderate signal loss is present in the distal right MCA branch vessels. The distal ACA branch vessels are poorly seen. The right vertebral artery is normal. The right PICA origin is not visualized. The left PICA origin is visualized. The distal left vertebral artery is small. The basilar artery is intact. Mild narrowing is present in the right P1 segment. A right posterior communicating artery is present. There is no significant left posterior communicating artery. Asymmetric attenuation of PCA branch vessels is worse on the right. IMPRESSION: 1. Acute/subacute nonhemorrhagic infarct within the posterior left frontal lobe and parietal lobe involving the precentral and postcentral gyrus. 2. Minimal  white matter disease otherwise. 3. The left internal carotid artery is occluded essentially through the ICA terminus. 4. The left ACA and MCA fill via a patent anterior communicating artery. 5. Significant signal loss in the right ACA suggests a high-grade stenosis. This is the only significant feeder to the entire left anterior hemisphere. The patient is at high risk for left ACA and MCA a larger infarcts. 6. Poor signal within left MCA branch vessels beyond the bifurcation suggesting decreased perfusion. 7. Moderate distal small vessel disease in the right MCA and bilateral ACA distributions. 8. The right vertebral artery is dominant. The basilar artery is normal. 9. Mild right P1 segment narrowing. 10. A right posterior communicating artery is present but no  left posterior communicating artery is present. 11. Asymmetric distal PCA branch vessel disease on the right. Electronically Signed   By: San Morelle M.D.   On: 06/07/2015 12:54   US Carotid Bilateral  06/06/2015  CLINICAL DATA:  Stroke EXAM: BILATERAL CAROTID DUPLEX ULTRASOUND TECHNIQUE: Pearline Cables scale imaging, color Doppler and duplex ultrasound were performed of bilateral carotid and vertebral arteries in the neck. COMPARISON:  None. FINDINGS: Criteria: Quantification of carotid stenosis is based on velocity parameters that correlate the residual internal carotid diameter with NASCET-based stenosis levels, using the diameter of the distal internal carotid lumen as the denominator for stenosis measurement. The following velocity measurements were obtained: RIGHT ICA:  122/40 cm/sec CCA:  123456 cm/sec SYSTOLIC ICA/CCA RATIO:  0.9 DIASTOLIC ICA/CCA RATIO:  1.1 ECA:  134 cm/sec LEFT Common carotid artery velocities 23 /3 centimeters/seconds. The distal common carotid artery, bowel, internal carotid, and external carotid arteries on the left appear to be completely occluded. The proximal and mid left common carotid artery have little flow with abnormal  waveforms noted. RIGHT CAROTID ARTERY: Normal appearance with no plaque RIGHT VERTEBRAL ARTERY:  Antegrade LEFT VERTEBRAL ARTERY:  Antegrade IMPRESSION: Complete occlusion of left carotid artery suspected based on ultrasound. This could be confirmed with CT arteriogram. Electronically Signed   By: Skipper Cliche M.D.   On: 06/06/2015 15:48     Assessment/Plan:  70 y.o. female with a known history of Hyperlipidemia, headache, TIA, chronic back pain, urinary frequency, stroke, coronary artery disease, sleep apnea on CPAP, atrial fibrillation - had hernia repair surgery done 05/26/2015, presenting with 3 days intermittent right arm numbness and weakness, gradual onset, usually self resolves, no modifying factors.  Symptoms improved. Pt was off xarelto for 4 days.    MRI brain strokes L ACA/MCA territory given the occlusion for L carotid.  Last imaging of carotid was in 2011 and it was patent . I suspect pt was develping chronic vascularture to compensate for L carotid.    MRA significant intracranial stenosis bilaterally  - Restart xarelto and ASA 81 daily - Pt/ot - restart home metoprolol but watch her BP would want it to be higher then 120 as she is perfusing her L anterior circulation from her R side through the ACA. - observe overnight Kayla Maxwell  06/07/2015, 1:40 PM

## 2015-06-08 LAB — ECHOCARDIOGRAM COMPLETE
Height: 65 in
Weight: 3120 oz

## 2015-06-08 MED ORDER — ASPIRIN 81 MG PO TBEC: 81.0000 mg | DELAYED_RELEASE_TABLET | Freq: Every day | ORAL | Status: DC

## 2015-06-08 MED ORDER — ATORVASTATIN CALCIUM 40 MG PO TABS: 40.0000 mg | ORAL_TABLET | Freq: Every day | ORAL | Status: DC

## 2015-06-08 NOTE — Clinical Documentation Improvement (Signed)
Internal Medicine Neurology  A cause and effect relationship may not be assumed and must be documented by a provider.  Please clarify the relationship, if any, between patient's CVA and being taken off of Xarelto due to recent hernia repair. Please document response in next progress note/discharge summary if applicable. Thank you!  Are the conditions:   Due to or associated with each other  Unrelated to each other  Other  Clinically Undetermined  Supporting Information (risk factors, sign and symptoms, diagnostics, treatment):  "Restart Xarelto and ASA 81 daily" documented in Neuro Consult dated 06/07/15  Please exercise your independent, professional judgment when responding. A specific answer is not anticipated or expected.  Thank You,  Zoila Shutter RN, BSN, [place] [phone]; Cell: [phone]

## 2015-06-08 NOTE — Progress Notes (Signed)
MD making rounds. Discharge orders received. IV discontinued. Prescriptions given to patient. Provided with Education Handouts. Discharge paperwork provided, explained, signed and witnessed. No unanswered questions. Discharged via wheelchair by Nursing Staff. Belongings sent with patient and family.

## 2015-06-08 NOTE — Clinical Documentation Improvement (Signed)
Internal Medicine  Can the diagnosis of Atrial Fibrillation be further specified? Please document response in next progress note/discharge summary. Thank you!   Chronic Atrial fibrillation  Paroxysmal Atrial fibrillation  Permanent Atrial fibrillation  Persistent Atrial fibrillation  Other  Clinically Undetermined  Document any associated diagnoses/conditions.  Supporting Information:  Was being treated with Xarelto prior to admit; held for recent surgery  Please exercise your independent, professional judgment when responding. A specific answer is not anticipated or expected.  Thank You,  Zoila Shutter RN, BSN, [place] [phone]; Cell: [phone]

## 2015-06-08 NOTE — Care Management Note (Signed)
Case Management Note  Patient Details  Name: EMOGENE SCHMUHL MRN: [id number] Date of Birth: 12-13-1945  Subjective/Objective:    Discharged to home with no home health services ordered. Ms Placido declined offer of CPAP. Per [facility]-PT no follow-up PT services needed.                 Action/Plan:   Expected Discharge Date:                  Expected Discharge Plan:     In-House Referral:     Discharge planning Services     Post Acute Care Choice:    Choice offered to:     DME Arranged:    DME Agency:     HH Arranged:    [place] Agency:     Status of Service:     Medicare Important Message Given:    Date Medicare IM Given:    Medicare IM give by:    Date Additional Medicare IM Given:    Additional Medicare Important Message give by:     If discussed at [place] of Stay Meetings, dates discussed:    Additional Comments:  Jyoti Harju A, RN 06/08/2015, 8:48 AM

## 2015-06-08 NOTE — Discharge Summary (Signed)
[facility] [place] NAME: Kayla Maxwell    MR#:  [id number]  DATE OF BIRTH:  June 07, 1945  DATE OF ADMISSION:  06/06/2015 ADMITTING PHYSICIAN: Vaughan Basta, MD  DATE OF DISCHARGE: 06/08/2015  9:58 AM  PRIMARY CARE PHYSICIAN: Margarita Rana, MD    ADMISSION DIAGNOSIS:  CVA (cerebral infarction) [I63.9] Cerebral infarction due to unspecified mechanism [I63.9]  DISCHARGE DIAGNOSIS:  Principal Problem:   CVA (cerebral infarction) Active Problems:   Acute CVA (cerebrovascular accident) ([place])   SECONDARY DIAGNOSIS:   Past Medical History  Diagnosis Date  . Allergy     takes Singulair daily as needed  . Acid reflux     takes Nexium daily  . Hyperlipidemia     takes Pravastatin daily-started taking this 6 months ago  . Constipation     takes Colace daily as needed  . Headache     sinus   . TIA (transient ischemic attack)     showed up on an MRI but she never knew it  . Chronic back pain     stenosis  . Urinary frequency   . History of blood transfusion   . History of shingles   . Stroke ([place])     TIA'S X 2 --- one last yr and one this yr...lost her memory over 4-6 hrs.  . Dysrhythmia     atrial fibrillation dx 04/2014  . Lumbar surgical wound fluid collection   . Coronary artery disease   . Sleep apnea     CPAP  . Cancer ([place]) AGE 69    CERVICAL cancer with removal  . Anemia   . Complication of anesthesia   . Difficult intubation   . Atrial fibrillation [facility])     HOSPITAL COURSE:   69 yo female hx of a. Fib, hx of previous CVA, hx of CAD, Chronic back pain, GERD, Hyperlipidemia, HTN, came into hospital due to right sided numbness and noted to have an acute CVA.   1. Acute CVA - this was the cause of pt's neurologic symptoms.  - MRI brain showing a acute/subacute CVA of the left frontal/parietal lobe. This is consistent with her symptoms of right facial and arm numbness. -CT angiogram of the neck showing complete  occlusion of the left carotid artery with someease on the right too but patient has good collateral flow. -Patient was seen by neurology who recommended continuing the patient Xarelto but adding aspirin. Patient was also changed over to a high dose intensity statin from Pravachol to atorvastatin. -Patient's clinical symptoms have improved and therefore she is being discharged home on dual therapy as mentioned above. Patient's is at high risk for left MCA stroke and some element of hypertension should be tolerated.  2. Carotid artery stenosis-patient noted to have 100% occlusion on the left carotid prior to the bifurcation. -Patient has good full collateral flow from the right. Patient is at high risk for having left-sided MCA stroke. -Seen by vascular surgery who recommended no further acute intervention presently. Patient will continue Xarelto, aspirin and follow-up as outpatient.  3. History of atrial fibrillation-currently rate controlled. - she will continue metoprolol, Xarelto.  4. Essential hypertension-continue metoprolol. Avoid severe hypotension given her high risk for CVA.  5. Hyperlipidemia- she was switched from Pravachol to atorvastatin.  6. GERD- she will continue Protonix.  DISCHARGE CONDITIONS:   Stable.   CONSULTS OBTAINED:  Treatment Team:  Leotis Pain, MD  DRUG ALLERGIES:   Allergies  Allergen Reactions  .  Codeine Other (See Comments)    Makes her feel "crazy"  . Penicillins Swelling  . Adhesive [Tape] Hives  . Sulfa Antibiotics Nausea Only and Rash    DISCHARGE MEDICATIONS:   Discharge Medication List as of 06/08/2015  9:36 AM    START taking these medications   Details  aspirin EC 81 MG EC tablet Take 1 tablet (81 mg total) by mouth daily., Starting 06/08/2015, Until Discontinued, Print    atorvastatin (LIPITOR) 40 MG tablet Take 1 tablet (40 mg total) by mouth daily at 6 PM., Starting 06/08/2015, Until Discontinued, Print      CONTINUE these  medications which have NOT CHANGED   Details  esomeprazole (NEXIUM) 40 MG capsule Take 40 mg by mouth daily at 12 noon., Until Discontinued, Historical Med    ibuprofen (ADVIL,MOTRIN) 600 MG tablet Take 600 mg by mouth every 6 (six) hours as needed., Until Discontinued, Historical Med    metoprolol tartrate (LOPRESSOR) 25 MG tablet Take 25 mg by mouth 2 (two) times daily., Until Discontinued, Historical Med    Probiotic Product (PROBIOTIC ADVANCED PO) Take 1 capsule by mouth daily. , Until Discontinued, Historical Med    rivaroxaban (XARELTO) 20 MG TABS tablet Take 20 mg by mouth daily with supper., Starting 08/27/2014, Until Discontinued, Historical Med    escitalopram (LEXAPRO) 10 MG tablet Take 1 tablet (10 mg total) by mouth daily., Starting 06/06/2015, Until Discontinued, Normal    fluticasone (FLONASE) 50 MCG/ACT nasal spray Place 2 sprays into both nostrils daily., Starting 12/31/2014, Until Discontinued, Normal    montelukast (SINGULAIR) 10 MG tablet Take 1 tablet (10 mg total) by mouth at bedtime., Starting 01/23/2015, Until Discontinued, Normal    pantoprazole (PROTONIX) 40 MG tablet Take 1 tablet (40 mg total) by mouth daily., Starting 03/28/2015, Until Discontinued, Normal    traMADol (ULTRAM) 50 MG tablet Take 1 tablet (50 mg total) by mouth every 6 (six) hours as needed., Starting 05/26/2015, Until Discontinued, Print      STOP taking these medications     pravastatin (PRAVACHOL) 40 MG tablet          DISCHARGE INSTRUCTIONS:   DIET:  Cardiac diet  DISCHARGE CONDITION:  Stable  ACTIVITY:  Activity as tolerated  OXYGEN:  Home Oxygen: No.   Oxygen Delivery: room air  DISCHARGE LOCATION:  home   If you experience worsening of your admission symptoms, develop shortness of breath, life threatening emergency, suicidal or homicidal thoughts you must seek medical attention immediately by calling 911 or calling your MD immediately  if symptoms less severe.  You Must  read complete instructions/literature along with all the possible adverse reactions/side effects for all the Medicines you take and that have been prescribed to you. Take any new Medicines after you have completely understood and accpet all the possible adverse reactions/side effects.   Please note  You were cared for by a hospitalist during your hospital stay. If you have any questions about your discharge medications or the care you received while you were in the hospital after you are discharged, you can call the unit and asked to speak with the hospitalist on call if the hospitalist that took care of you is not available. Once you are discharged, your primary care physician will handle any further medical issues. Please note that NO REFILLS for any discharge medications will be authorized once you are discharged, as it is imperative that you return to your primary care physician (or establish a relationship with a primary care  physician if you do not have one) for your aftercare needs so that they can reassess your need for medications and monitor your lab values.     Today   No acute events overnight. Numbness in the right upper ext. Much improved/resolved.   VITAL SIGNS:  Blood pressure 165/94, pulse 87, temperature 97.8 F (36.6 C), temperature source Oral, resp. rate 18, height 5\' 5"  (1.651 m), weight 88.451 kg (195 lb), SpO2 96 %.  I/O:   Intake/Output Summary (Last 24 hours) at 06/08/15 1320 Last data filed at 06/08/15 0533  Gross per 24 hour  Intake    840 ml  Output      0 ml  Net    840 ml    PHYSICAL EXAMINATION:  GENERAL:  70 y.o.-year-old patient lying in the bed with no acute distress.  EYES: Pupils equal, round, reactive to light and accommodation. No scleral icterus. Extraocular muscles intact.  HEENT: Head atraumatic, normocephalic. Oropharynx and nasopharynx clear.  NECK:  Supple, no jugular venous distention. No thyroid enlargement, no tenderness.  LUNGS: Normal  breath sounds bilaterally, no wheezing, rales,rhonchi. No use of accessory muscles of respiration.  CARDIOVASCULAR: S1, S2 normal. No murmurs, rubs, or gallops.  ABDOMEN: Soft, non-tender, non-distended. Bowel sounds present. No organomegaly or mass.  EXTREMITIES: No pedal edema, cyanosis, or clubbing.  NEUROLOGIC: Cranial nerves II through XII are intact. No focal motor or sensory defecits b/l.  PSYCHIATRIC: The patient is alert and oriented x 3. Good affect.  SKIN: No obvious rash, lesion, or ulcer.   DATA REVIEW:   CBC  Recent Labs Lab 06/07/15 0633  WBC 7.8  HGB 15.7  HCT 46.5  PLT 227    Chemistries   Recent Labs Lab 06/06/15 0952 06/07/15 0633  NA 140 140  K 3.8 3.9  CL 106 105  CO2 25 29  GLUCOSE 123* 107*  BUN 18 16  CREATININE 1.14* 1.07*  CALCIUM 9.4 8.9  AST 21  --   ALT 21  --   ALKPHOS 85  --   BILITOT 0.6  --     Cardiac Enzymes  Recent Labs Lab 06/06/15 0952  TROPONINI <0.03    RADIOLOGY:  Ct Angio Neck W/cm &/or Wo/cm  06/07/2015  CLINICAL DATA:  Numbness and lack of coordination in the right upper extremity. Weakness in the right hand. EXAM: CT ANGIOGRAPHY NECK TECHNIQUE: Multidetector CT imaging of the neck was performed using the standard protocol during bolus administration of intravenous contrast. Multiplanar CT image reconstructions and MIPs were obtained to evaluate the vascular anatomy. Carotid stenosis measurements (when applicable) are obtained utilizing NASCET criteria, using the distal internal carotid diameter as the denominator. CONTRAST:  Sending 5 mL Isovue 370 COMPARISON:  None. FINDINGS: Aortic arch: A 3 vessel arch configuration is present. There is wall thickening about the origins the great vessels and aortic arch. Right carotid system: A soft tissue plaque extends into the lumen of the innominate artery 4 mm. There is no significant stenosis. The right common carotid artery demonstrates some mural calcification without  significant stenosis. There is motion at the level of the carotid bifurcation. Atherosclerotic calcifications are present at the level of the right carotid bifurcation. The study is nondiagnostic just beyond the bifurcation. Normal flow is present on the ultrasound. Mild tortuosity is present in the cervical right ICA. There is a moderate stenosis at the second point tortuosity relative to the more distal vessel. The distal right ICA is normal. Left carotid system: The left  common carotid artery is occluded 2 cm from its origin. There is no reconstitution of the left common carotid artery or internal carotid artery in the neck. There is no contrast within the left internal carotid artery through the visualize cavernous segment. Vertebral arteries:The vertebral arteries originate from the subclavian arteries bilaterally. Mild narrowing is present proximal left subclavian artery without a significant stenosis relative to the more distal vessel. The right vertebral artery is the dominant vessel. Calcifications are present at the origin of the right vertebral artery. There is no significant stenosis of the origin or throughout the course of the right vertebral artery in the neck. The proximal left vertebral artery is not well seen. There is motion artifact and artifact from venous contrast. The more distal left vertebral artery is within normal limits. Vertebrobasilar junction is within normal limits. The visualized proximal basilar artery is unremarkable. Skeleton: Multilevel spondylosis is present in the cervical spine. Uncovertebral spurring leads to multilevel foraminal narrowing from C3-4 through C7-T1, worse on the right. Slight degenerative anterolisthesis and facet hypertrophy is noted at C2-3, worse on the left. Other neck: Centrilobular emphysematous changes are noted. Moderate scarring is present at the lung apices but laterally. No focal mucosal or submucosal lesions are present. The thyroid is somewhat  heterogeneous. No significant adenopathy is present. Salivary glands are within normal limits. Limited imaging of the brain is unremarkable. IMPRESSION: 1. The left common carotid artery is occluded 2 cm from the aortic arch. There is no reconstitution through the visualize cavernous segment of the left internal carotid artery. 2. Atherosclerotic changes in the right common carotid artery and at the right carotid bifurcation without definite stenosis. 3. Tortuosity of the right internal carotid artery with a mild to moderate stenosis approximately 3 cm from the right carotid bifurcation. 4. Possible narrowing of the proximal left vertebral artery before entering the spinal canal. This is the non dominant vessel. 5. No significant stenosis in the dominant right vertebral artery. 6. Multilevel spondylosis of the cervical spine with osseous foraminal narrowing worse on the right. 7. Moderate centrilobular emphysema. Electronically Signed   By: San Morelle M.D.   On: 06/07/2015 10:39   Mr Jodene Nam Head Wo Contrast  06/07/2015  CLINICAL DATA:  Numbness and incoordination in the right upper extremity. Acute infarct. EXAM: MRI HEAD WITHOUT CONTRAST MRA HEAD WITHOUT CONTRAST TECHNIQUE: Multiplanar, multiecho pulse sequences of the brain and surrounding structures were obtained without intravenous contrast. Angiographic images of the head were obtained using MRA technique without contrast. COMPARISON:  CTA of the neck 06/07/2015. CT head without contrast 06/06/2015. FINDINGS: MRI HEAD FINDINGS Diffusion-weighted images confirm an acute nonhemorrhagic infarct involving vein left precentral and postcentral gyrus. No other acute infarct is present. Extensive T2 changes are associated with the area of acute/subacute infarction. Mild periventricular T2 changes are otherwise within normal limits for age. There is a remote lacunar infarct of the right lentiform nucleus. No other significant white matter disease is present.  Minimal white matter changes extend into the brainstem. The cerebellum is within normal limits. Internal auditory canals are normal bilaterally. The left internal carotid artery is occluded. Flow is present in the vertebrobasilar artery and in the right internal carotid artery. The globes and orbits are intact. The left mastoid air cells are clear. There is some fluid in the right mastoid air cells. MRA HEAD FINDINGS The left internal carotid artery is occluded. Mild atherosclerotic changes are noted within the cavernous right internal carotid artery. The right M1 segment is normal.  There is significant signal loss in the right A1 segment. There is signal in both A2 segments with a patent anterior communicating artery. Signal is present in the left A1 segment and left M1 segment. The very distal aspect of the terminal left ICA is noted. There is a almost no signal beyond the left MCA bifurcation. Moderate signal loss is present in the distal right MCA branch vessels. The distal ACA branch vessels are poorly seen. The right vertebral artery is normal. The right PICA origin is not visualized. The left PICA origin is visualized. The distal left vertebral artery is small. The basilar artery is intact. Mild narrowing is present in the right P1 segment. A right posterior communicating artery is present. There is no significant left posterior communicating artery. Asymmetric attenuation of PCA branch vessels is worse on the right. IMPRESSION: 1. Acute/subacute nonhemorrhagic infarct within the posterior left frontal lobe and parietal lobe involving the precentral and postcentral gyrus. 2. Minimal white matter disease otherwise. 3. The left internal carotid artery is occluded essentially through the ICA terminus. 4. The left ACA and MCA fill via a patent anterior communicating artery. 5. Significant signal loss in the right ACA suggests a high-grade stenosis. This is the only significant feeder to the entire left anterior  hemisphere. The patient is at high risk for left ACA and MCA a larger infarcts. 6. Poor signal within left MCA branch vessels beyond the bifurcation suggesting decreased perfusion. 7. Moderate distal small vessel disease in the right MCA and bilateral ACA distributions. 8. The right vertebral artery is dominant. The basilar artery is normal. 9. Mild right P1 segment narrowing. 10. A right posterior communicating artery is present but no left posterior communicating artery is present. 11. Asymmetric distal PCA branch vessel disease on the right. Electronically Signed   By: San Morelle M.D.   On: 06/07/2015 12:54   Mr Brain Wo Contrast  06/07/2015  CLINICAL DATA:  Numbness and incoordination in the right upper extremity. Acute infarct. EXAM: MRI HEAD WITHOUT CONTRAST MRA HEAD WITHOUT CONTRAST TECHNIQUE: Multiplanar, multiecho pulse sequences of the brain and surrounding structures were obtained without intravenous contrast. Angiographic images of the head were obtained using MRA technique without contrast. COMPARISON:  CTA of the neck 06/07/2015. CT head without contrast 06/06/2015. FINDINGS: MRI HEAD FINDINGS Diffusion-weighted images confirm an acute nonhemorrhagic infarct involving vein left precentral and postcentral gyrus. No other acute infarct is present. Extensive T2 changes are associated with the area of acute/subacute infarction. Mild periventricular T2 changes are otherwise within normal limits for age. There is a remote lacunar infarct of the right lentiform nucleus. No other significant white matter disease is present. Minimal white matter changes extend into the brainstem. The cerebellum is within normal limits. Internal auditory canals are normal bilaterally. The left internal carotid artery is occluded. Flow is present in the vertebrobasilar artery and in the right internal carotid artery. The globes and orbits are intact. The left mastoid air cells are clear. There is some fluid in the  right mastoid air cells. MRA HEAD FINDINGS The left internal carotid artery is occluded. Mild atherosclerotic changes are noted within the cavernous right internal carotid artery. The right M1 segment is normal. There is significant signal loss in the right A1 segment. There is signal in both A2 segments with a patent anterior communicating artery. Signal is present in the left A1 segment and left M1 segment. The very distal aspect of the terminal left ICA is noted. There is a almost no  signal beyond the left MCA bifurcation. Moderate signal loss is present in the distal right MCA branch vessels. The distal ACA branch vessels are poorly seen. The right vertebral artery is normal. The right PICA origin is not visualized. The left PICA origin is visualized. The distal left vertebral artery is small. The basilar artery is intact. Mild narrowing is present in the right P1 segment. A right posterior communicating artery is present. There is no significant left posterior communicating artery. Asymmetric attenuation of PCA branch vessels is worse on the right. IMPRESSION: 1. Acute/subacute nonhemorrhagic infarct within the posterior left frontal lobe and parietal lobe involving the precentral and postcentral gyrus. 2. Minimal white matter disease otherwise. 3. The left internal carotid artery is occluded essentially through the ICA terminus. 4. The left ACA and MCA fill via a patent anterior communicating artery. 5. Significant signal loss in the right ACA suggests a high-grade stenosis. This is the only significant feeder to the entire left anterior hemisphere. The patient is at high risk for left ACA and MCA a larger infarcts. 6. Poor signal within left MCA branch vessels beyond the bifurcation suggesting decreased perfusion. 7. Moderate distal small vessel disease in the right MCA and bilateral ACA distributions. 8. The right vertebral artery is dominant. The basilar artery is normal. 9. Mild right P1 segment narrowing.  10. A right posterior communicating artery is present but no left posterior communicating artery is present. 11. Asymmetric distal PCA branch vessel disease on the right. Electronically Signed   By: San Morelle M.D.   On: 06/07/2015 12:54   US Carotid Bilateral  06/06/2015  CLINICAL DATA:  Stroke EXAM: BILATERAL CAROTID DUPLEX ULTRASOUND TECHNIQUE: Pearline Cables scale imaging, color Doppler and duplex ultrasound were performed of bilateral carotid and vertebral arteries in the neck. COMPARISON:  None. FINDINGS: Criteria: Quantification of carotid stenosis is based on velocity parameters that correlate the residual internal carotid diameter with NASCET-based stenosis levels, using the diameter of the distal internal carotid lumen as the denominator for stenosis measurement. The following velocity measurements were obtained: RIGHT ICA:  122/40 cm/sec CCA:  123456 cm/sec SYSTOLIC ICA/CCA RATIO:  0.9 DIASTOLIC ICA/CCA RATIO:  1.1 ECA:  134 cm/sec LEFT Common carotid artery velocities 23 /3 centimeters/seconds. The distal common carotid artery, bowel, internal carotid, and external carotid arteries on the left appear to be completely occluded. The proximal and mid left common carotid artery have little flow with abnormal waveforms noted. RIGHT CAROTID ARTERY: Normal appearance with no plaque RIGHT VERTEBRAL ARTERY:  Antegrade LEFT VERTEBRAL ARTERY:  Antegrade IMPRESSION: Complete occlusion of left carotid artery suspected based on ultrasound. This could be confirmed with CT arteriogram. Electronically Signed   By: Skipper Cliche M.D.   On: 06/06/2015 15:48      Management plans discussed with the patient, family and they are in agreement.  CODE STATUS:  Code Status History    Date Active Date Inactive Code Status Order ID Comments User Context   06/06/2015  1:10 PM 06/08/2015  1:17 PM Full Code [id number]  Vaughan Basta, MD Inpatient   08/19/2014 11:29 AM 08/29/2014  6:25 PM Full Code [id number]  Karie Chimera, MD Inpatient   07/04/2014  1:10 PM 07/05/2014  5:43 PM Full Code [id number]  Karie Chimera, MD Inpatient      TOTAL TIME TAKING CARE OF THIS PATIENT: 40 minutes.    Henreitta Leber M.D on 06/08/2015 at 1:20 PM  Between 7am to 6pm - Pager - [phone]  After 6pm go to [email or web address] -  password [email or web address] [facility]  [facility]  Office  [phone]  CC: Primary care physician; Margarita Rana, MD

## 2015-06-09 ENCOUNTER — Ambulatory Visit
Admission: RE | Admit: 2015-06-09 | Discharge: 2015-06-09 | Disposition: A | Discharge status: Home / Self Care | Payer: Medicare Other | Source: Ambulatory Visit | Attending: Family Medicine | Admitting: Family Medicine

## 2015-06-09 ENCOUNTER — Encounter: Payer: Self-pay | Admitting: *Deleted

## 2015-06-09 ENCOUNTER — Encounter: Payer: Self-pay | Admitting: Family Medicine

## 2015-06-09 ENCOUNTER — Ambulatory Visit (INDEPENDENT_AMBULATORY_CARE_PROVIDER_SITE_OTHER): Payer: Medicare Other | Admitting: Family Medicine

## 2015-06-09 VITALS — BP 134/78 | HR 80 | Temp 97.9°F | Resp 20 | Wt 194.0 lb

## 2015-06-09 DIAGNOSIS — I6529 Occlusion and stenosis of unspecified carotid artery: Secondary | ICD-10-CM | POA: Diagnosis not present

## 2015-06-09 DIAGNOSIS — I482 Chronic atrial fibrillation, unspecified: Secondary | ICD-10-CM

## 2015-06-09 DIAGNOSIS — R05 Cough: Secondary | ICD-10-CM | POA: Diagnosis not present

## 2015-06-09 DIAGNOSIS — R059 Cough, unspecified: Secondary | ICD-10-CM | POA: Insufficient documentation

## 2015-06-09 DIAGNOSIS — I63232 Cerebral infarction due to unspecified occlusion or stenosis of left carotid arteries: Secondary | ICD-10-CM

## 2015-06-09 MED ORDER — ATORVASTATIN CALCIUM 40 MG PO TABS: 40.0000 mg | ORAL_TABLET | Freq: Every day | ORAL | Status: DC

## 2015-06-09 NOTE — Progress Notes (Signed)
Subjective:    Patient ID: Kayla Maxwell, female    DOB: [birth year], 70 y.o.   MRN: [id number]  HPI  70 yo female here for recheck from [place]. Diagnosed with CVA.  Had been off Xarelto.  Was started on ASA and Xarelto.   Work up showed blockage in her carotid artery, 100 percent on left. Has been doing well since discharge except for several episodes of confusion and numbness, last about 5 minutes.  Just can not get it together. Does remember these events. Did no change her cholesterol medication. Sitts having trouble with word recall. Does need to make appointment with Dr. Nehemiah Massed.   Has not follow up with neurology scheduled.    Had just had hernia surgery and was off her medication.   Did well post op except for reaction to bandage. Eating ok.    Patient Active Problem List   Diagnosis Date Noted  . CVA (cerebral infarction) 06/06/2015  . Cerebral vascular accident ([place]) 06/06/2015  . Abnormal blood sugar 04/11/2015  . Anxiety 11/19/2014  . Contact dermatitis 10/29/2014  . PTSD (post-traumatic stress disorder) 10/29/2014  . Abnormal hemoglobin ([place]) 09/05/2014  . Acid reflux 09/05/2014  . Allergic rhinitis 09/05/2014  . HLD (hyperlipidemia) 09/05/2014  . Disc disorder 09/05/2014  . Mild memory disturbance 09/05/2014  . H/O stroke without residual deficits 09/05/2014  . Cystitis 09/05/2014  . Edema 09/05/2014  . Spinal stenosis, lumbar 07/04/2014  . MI (mitral incompetence) 05/08/2014  . TI (tricuspid incompetence) 05/08/2014  . Chronic atrial fibrillation ([place]) 05/08/2014  . History of tobacco abuse 04/29/2014  . Gastro-esophageal reflux disease without esophagitis 04/29/2014  . Obstructive apnea 04/29/2014  . Atrial fibrillation by electrocardiography ([place]) 04/29/2014  . Neuritis or radiculitis due to rupture of lumbar intervertebral disc 09/21/2013  . DDD (degenerative disc disease), lumbar 08/24/2013  . Bursitis, trochanteric 06/18/2013  . Nerve root inflammation  06/18/2013  . Arthralgia of hip 06/04/2013  . Calculus of gallbladder with other cholecystitis, without mention of obstruction 04/27/2012   Past Medical History  Diagnosis Date  . Allergy     takes Singulair daily as needed  . Acid reflux     takes Nexium daily  . Hyperlipidemia     takes Pravastatin daily-started taking this 6 months ago  . Constipation     takes Colace daily as needed  . Headache     sinus   . TIA (transient ischemic attack)     showed up on an MRI but she never knew it  . Chronic back pain     stenosis  . Urinary frequency   . History of blood transfusion   . History of shingles   . Stroke ([place])     TIA'S X 2 --- one last yr and one this yr...lost her memory over 4-6 hrs.  . Dysrhythmia     atrial fibrillation dx 04/2014  . Lumbar surgical wound fluid collection   . Coronary artery disease   . Sleep apnea     CPAP  . Cancer ([place]) AGE 67    CERVICAL cancer with removal  . Anemia   . Complication of anesthesia   . Difficult intubation   . Atrial fibrillation [facility])    Current Outpatient Prescriptions on File Prior to Visit  Medication Sig  . aspirin EC 81 MG EC tablet Take 1 tablet (81 mg total) by mouth daily.  Marland Kitchen escitalopram (LEXAPRO) 10 MG tablet Take 1 tablet (10 mg total) by mouth daily.  Marland Kitchen  ibuprofen (ADVIL,MOTRIN) 600 MG tablet Take 600 mg by mouth every 6 (six) hours as needed.  . montelukast (SINGULAIR) 10 MG tablet Take 1 tablet (10 mg total) by mouth at bedtime.  . pantoprazole (PROTONIX) 40 MG tablet Take 1 tablet (40 mg total) by mouth daily.  . rivaroxaban (XARELTO) 20 MG TABS tablet Take 20 mg by mouth daily with supper.  . esomeprazole (NEXIUM) 40 MG capsule Take 40 mg by mouth daily at 12 noon. Reported on 06/09/2015  . fluticasone (FLONASE) 50 MCG/ACT nasal spray Place 2 sprays into both nostrils daily. (Patient not taking: Reported on 06/06/2015)  . metoprolol tartrate (LOPRESSOR) 25 MG tablet Take 25 mg by mouth 2 (two) times daily.  Reported on 06/09/2015   No current facility-administered medications on file prior to visit.   Allergies  Allergen Reactions  . Codeine Other (See Comments)    Makes her feel "crazy"  . Penicillins Swelling  . Adhesive [Tape] Hives  . Sulfa Antibiotics Nausea Only and Rash   Past Surgical History  Procedure Laterality Date  . Cesarean section  1968  . Appendectomy  1963  . Tonsillectomy    . Breast surgery  1984    augementation  . Abdominal hysterectomy    . Colonoscopy    . Esophagogastroduodenoscopy    . Lumbar laminectomy with coflex 1 level Bilateral 07/04/2014    Procedure: Laminectomy and Foraminotomy - bilateral - Lumbar Four-Five with coflex ;  Surgeon: Karie Chimera, MD;  Location: [place] NEURO ORS;  Service: Neurosurgery;  Laterality: Bilateral;  . Lumbar wound debridement N/A 08/19/2014    Procedure: Exploration of Lumbar wound;  Surgeon: Karie Chimera, MD;  Location: [place] NEURO ORS;  Service: Neurosurgery;  Laterality: N/A;  . Hammer toe surgery Bilateral   . Back surgery  08/19/2014    Dr. Hal Neer ([place] otho)  . Cholecystectomy  2014    Dr Jamal Collin  . Ventral hernia repair N/A 05/26/2015    Procedure: HERNIA REPAIR VENTRAL ADULT;  Surgeon: Christene Lye, MD;  Location: [facility] ORS;  Service: General;  Laterality: N/A;   Social History   Social History  . Marital Status: Divorced    Spouse Name: N/A  . Number of Children: 1  . Years of Education: College   Occupational History  . Retired    Social History Main Topics  . Smoking status: Former Smoker -- 1.00 packs/day for 45 years    Quit date: 08/19/2014  . Smokeless tobacco: Former Systems developer    Quit date: 08/19/2014  . Alcohol Use: No     Comment: Rarely  . Drug Use: No  . Sexual Activity: Not on file   Other Topics Concern  . Not on file   Social History Narrative   Family History  Problem Relation Age of Onset  . Alzheimer's disease Mother   . Heart attack Father   . Pancreatic cancer Sister     . Healthy Brother       Review of Systems  Constitutional: Negative for chills, diaphoresis, activity change and appetite change.  Eyes: Positive for visual disturbance.  Respiratory: Positive for cough.   Endocrine: Positive for cold intolerance.  Musculoskeletal: Positive for myalgias.  Neurological: Negative for facial asymmetry.  Psychiatric/Behavioral: Positive for confusion.       Objective:   Physical Exam  Constitutional: She is oriented to person, place, and time. She appears well-developed and well-nourished.  Cardiovascular: Normal rate.  An irregularly irregular rhythm present.  Pulmonary/Chest: Effort normal and breath  sounds normal.  Neurological: She is alert and oriented to person, place, and time.  Psychiatric: She has a normal mood and affect. Her behavior is normal. Judgment and thought content normal.  BP 134/78 mmHg  Pulse 80  Temp(Src) 97.9 F (36.6 C) (Oral)  Resp 20  Wt 194 lb (87.998 kg)     Assessment & Plan:  1. Chronic atrial fibrillation (HCC) Condition is stable. Please continue current medication and  plan of care as noted.    2. Cerebral infarction due to occlusion of left carotid artery (HCC) Change medication as noted.   Will recheck labs at follow up. Lipid  and met c.   - atorvastatin (LIPITOR) 40 MG tablet; Take 1 tablet (40 mg total) by mouth daily at 6 PM.  Dispense: 30 tablet; Refill: 5 - Ambulatory referral to Vascular Surgery  3. Cough Will check CXR. Consider further work up if does not improve.    - DG Chest 2 View; Future   Margarita Rana, MD

## 2015-06-10 ENCOUNTER — Telehealth: Payer: Self-pay

## 2015-06-10 ENCOUNTER — Encounter: Payer: Self-pay | Admitting: General Surgery

## 2015-06-10 ENCOUNTER — Ambulatory Visit (INDEPENDENT_AMBULATORY_CARE_PROVIDER_SITE_OTHER): Payer: Medicare Other | Admitting: General Surgery

## 2015-06-10 VITALS — BP 154/90 | HR 82 | Resp 16 | Ht 65.0 in | Wt 193.0 lb

## 2015-06-10 DIAGNOSIS — K439 Ventral hernia without obstruction or gangrene: Secondary | ICD-10-CM

## 2015-06-10 NOTE — Telephone Encounter (Signed)
Advised pt of results. Pt verbally acknowledges understanding. Emily Drozdowski, CMA   

## 2015-06-10 NOTE — Progress Notes (Signed)
She is here today postoperative visit, ventral hernia repair with mesh- 8 cm Ventralex ST on 05-26-15. She states she is doing well. She did have a stroke 06-04-15 with right sided weakness/numbness. She states the right sided weakness/numbness is better. She states the numbness lasted 2 days. She does states her legs hurt some when she walks. She states the pain last a few minutes. I have reviewed the history of present illness with the patient.  Incision is clean and intact. No seroma or hernia recurrence noted Feet are warm, color normal, cap refill < 2secs Right DP 2+ PT  Left DP 0 PT 0 Femoral     Proper lifting techniques reviewed. Resume activities as tolerated. The patient is aware to call back for any questions or concerns. Follow up next week for ankle doppler study. Follow up in 5 weeks for check on hernia repair PCP:  Margarita Rana This information has been scribed by Karie Fetch RN, BSN,BC.

## 2015-06-10 NOTE — Telephone Encounter (Signed)
-----   Message from Margarita Rana, MD sent at 06/09/2015  4:14 PM EDT ----- CXR normal.  Follow up if cough continues. Thanks.

## 2015-06-10 NOTE — Patient Instructions (Signed)
Proper lifting techniques reviewed. Resume activities as tolerated. The patient is aware to call back for any questions or concerns.

## 2015-06-11 ENCOUNTER — Telehealth: Payer: Self-pay | Admitting: Family Medicine

## 2015-06-11 DIAGNOSIS — J018 Other acute sinusitis: Secondary | ICD-10-CM

## 2015-06-11 MED ORDER — AZITHROMYCIN 250 MG PO TABS: ORAL_TABLET | ORAL | Status: DC

## 2015-06-11 NOTE — Telephone Encounter (Signed)
Pt called saying she has a sinus infection wanting to know if you can call in an antibiotic.  She said she was just in a couple days ago.  She uses [facility] in [place].  [employer]

## 2015-06-11 NOTE — Telephone Encounter (Signed)
Pt advised that azithromycin is sent to [facility].

## 2015-06-11 NOTE — Telephone Encounter (Signed)
Please clarify what she has been on previously. Thanks.

## 2015-06-14 ENCOUNTER — Encounter: Payer: Self-pay | Admitting: General Surgery

## 2015-06-17 ENCOUNTER — Ambulatory Visit (INDEPENDENT_AMBULATORY_CARE_PROVIDER_SITE_OTHER): Payer: Medicare Other | Admitting: General Surgery

## 2015-06-17 ENCOUNTER — Encounter: Payer: Self-pay | Admitting: General Surgery

## 2015-06-17 VITALS — BP 140/78 | HR 76 | Resp 12 | Ht 65.0 in | Wt 192.0 lb

## 2015-06-17 DIAGNOSIS — I739 Peripheral vascular disease, unspecified: Secondary | ICD-10-CM | POA: Diagnosis not present

## 2015-06-17 DIAGNOSIS — R002 Palpitations: Secondary | ICD-10-CM | POA: Diagnosis not present

## 2015-06-17 DIAGNOSIS — I482 Chronic atrial fibrillation: Secondary | ICD-10-CM | POA: Diagnosis not present

## 2015-06-17 DIAGNOSIS — K219 Gastro-esophageal reflux disease without esophagitis: Secondary | ICD-10-CM | POA: Diagnosis not present

## 2015-06-17 DIAGNOSIS — F17219 Nicotine dependence, cigarettes, with unspecified nicotine-induced disorders: Secondary | ICD-10-CM | POA: Diagnosis not present

## 2015-06-17 DIAGNOSIS — I071 Rheumatic tricuspid insufficiency: Secondary | ICD-10-CM | POA: Diagnosis not present

## 2015-06-17 DIAGNOSIS — R0602 Shortness of breath: Secondary | ICD-10-CM | POA: Diagnosis not present

## 2015-06-17 DIAGNOSIS — I340 Nonrheumatic mitral (valve) insufficiency: Secondary | ICD-10-CM | POA: Diagnosis not present

## 2015-06-17 DIAGNOSIS — G4733 Obstructive sleep apnea (adult) (pediatric): Secondary | ICD-10-CM | POA: Diagnosis not present

## 2015-06-17 DIAGNOSIS — I4891 Unspecified atrial fibrillation: Secondary | ICD-10-CM | POA: Diagnosis not present

## 2015-06-17 DIAGNOSIS — E782 Mixed hyperlipidemia: Secondary | ICD-10-CM | POA: Diagnosis not present

## 2015-06-17 NOTE — Progress Notes (Signed)
Patient ID: Kayla Maxwell, female   DOB: 18-Jan-1945, 70 y.o.   MRN: [id number]  Chief Complaint  Patient presents with  . Follow-up    ABI's    HPI Kayla Maxwell is a 70 y.o. female here today for ABI's. She has been having pain with walking for over a year now. States left foot is worse than right foot. Patient states the bilateral swelling in lower extremities has gotten better.  I have reviewed the history of present illness with the patient.  ABI performed today.  HPI  Past Medical History  Diagnosis Date  . Allergy     takes Singulair daily as needed  . Acid reflux     takes Nexium daily  . Hyperlipidemia     takes Pravastatin daily-started taking this 6 months ago  . Constipation     takes Colace daily as needed  . Headache     sinus   . TIA (transient ischemic attack)     showed up on an MRI but she never knew it  . Chronic back pain     stenosis  . Urinary frequency   . History of blood transfusion   . History of shingles   . Dysrhythmia     atrial fibrillation dx 04/2014  . Lumbar surgical wound fluid collection   . Coronary artery disease   . Sleep apnea     CPAP  . Cancer ([place]) AGE 76    CERVICAL cancer with removal  . Anemia   . Complication of anesthesia   . Difficult intubation   . Atrial fibrillation ([place])   . Stroke ([place])     TIA'S X 2 --- one last yr and one this yr...lost her memory over 4-6 hrs.  . Stroke [facility]) 06-04-15    left with right side weakness    Past Surgical History  Procedure Laterality Date  . Cesarean section  1968  . Appendectomy  1963  . Tonsillectomy    . Breast surgery  1984    augementation  . Abdominal hysterectomy    . Colonoscopy    . Esophagogastroduodenoscopy    . Lumbar laminectomy with coflex 1 level Bilateral 07/04/2014    Procedure: Laminectomy and Foraminotomy - bilateral - Lumbar Four-Five with coflex ;  Surgeon: Karie Chimera, MD;  Location: [place] NEURO ORS;  Service: Neurosurgery;  Laterality: Bilateral;  .  Lumbar wound debridement N/A 08/19/2014    Procedure: Exploration of Lumbar wound;  Surgeon: Karie Chimera, MD;  Location: [place] NEURO ORS;  Service: Neurosurgery;  Laterality: N/A;  . Hammer toe surgery Bilateral   . Back surgery  08/19/2014    Dr. Hal Neer ([place] otho)  . Cholecystectomy  2014    Dr Jamal Collin  . Ventral hernia repair N/A 05/26/2015    Procedure: HERNIA REPAIR VENTRAL ADULT;  Surgeon: Christene Lye, MD;  Location: [facility] ORS;  Service: General;  Laterality: N/A;    Family History  Problem Relation Age of Onset  . Alzheimer's disease Mother   . Heart attack Father   . Pancreatic cancer Sister   . Healthy Brother     Social History Social History  Substance Use Topics  . Smoking status: Former Smoker -- 1.00 packs/day for 45 years    Quit date: 08/19/2014  . Smokeless tobacco: Former Systems developer    Quit date: 08/19/2014  . Alcohol Use: No     Comment: Rarely    Allergies  Allergen Reactions  . Codeine Other (See Comments)  Makes her feel "crazy"  . Penicillins Swelling  . Adhesive [Tape] Hives  . Sulfa Antibiotics Nausea Only and Rash    Current Outpatient Prescriptions  Medication Sig Dispense Refill  . aspirin EC 81 MG EC tablet Take 1 tablet (81 mg total) by mouth daily. 30 tablet 1  . atorvastatin (LIPITOR) 40 MG tablet Take 1 tablet (40 mg total) by mouth daily at 6 PM. 30 tablet 5  . azithromycin (ZITHROMAX) 250 MG tablet Take 2 tablets on day one, and one tablet daily 6 tablet 0  . escitalopram (LEXAPRO) 10 MG tablet Take 1 tablet (10 mg total) by mouth daily. 30 tablet 3  . esomeprazole (NEXIUM) 40 MG capsule Take 40 mg by mouth daily at 12 noon. Reported on 06/09/2015    . fluticasone (FLONASE) 50 MCG/ACT nasal spray Place 2 sprays into both nostrils daily. 16 g 6  . ibuprofen (ADVIL,MOTRIN) 600 MG tablet Take 600 mg by mouth every 6 (six) hours as needed.    . metoprolol tartrate (LOPRESSOR) 25 MG tablet Take 25 mg by mouth 2 (two) times daily.  Reported on 06/09/2015    . montelukast (SINGULAIR) 10 MG tablet Take 1 tablet (10 mg total) by mouth at bedtime. 30 tablet 3  . pantoprazole (PROTONIX) 40 MG tablet Take 1 tablet (40 mg total) by mouth daily. 30 tablet 5  . rivaroxaban (XARELTO) 20 MG TABS tablet Take 20 mg by mouth daily with supper.     No current facility-administered medications for this visit.    Review of Systems Review of Systems  Constitutional: Negative.   Respiratory: Negative.   Cardiovascular: Negative.   Gastrointestinal: Negative.     Blood pressure 140/78, pulse 76, resp. rate 12, height 5\' 5"  (1.651 m), weight 192 lb (87.091 kg).  Physical Exam Physical Exam  Constitutional: She is oriented to person, place, and time. She appears well-developed and well-nourished.  Neurological: She is alert and oriented to person, place, and time.  Skin: Skin is warm and dry.    Data Reviewed Prior Notes   Assessment    Ankle Brachial Index: Pt has 2+right DP, 1+ right PT. Left foot with no palpable pulses. Doppler flow heard over all 4 vessels. Biphasic in right DP, rest monophasic. Pule is irregular-known problem. Left side significantly decreased: 0.48 Right side borderline 0.89  PAD, peripheral arterial disease with left leg worse than right. She also has had chronic back problems. Will ask Dr. Delana Meyer  to evaluate this when pt sees him later this month.        Plan    ABI results to be sent to Dr Delana Meyer.   Patient is to follow-up with clinic as scheduled.     PCP:  Venia Minks This information has been scribed by Gaspar Cola CMA.    SANKAR,SEEPLAPUTHUR G 06/17/2015, 2:39 PM

## 2015-06-30 ENCOUNTER — Telehealth: Payer: Self-pay | Admitting: Family Medicine

## 2015-06-30 ENCOUNTER — Ambulatory Visit (INDEPENDENT_AMBULATORY_CARE_PROVIDER_SITE_OTHER): Payer: Medicare Other | Admitting: Family Medicine

## 2015-06-30 ENCOUNTER — Encounter: Payer: Self-pay | Admitting: Family Medicine

## 2015-06-30 ENCOUNTER — Other Ambulatory Visit: Payer: Self-pay | Admitting: Family Medicine

## 2015-06-30 VITALS — BP 132/84 | HR 76 | Temp 97.6°F | Resp 16 | Wt 192.0 lb

## 2015-06-30 DIAGNOSIS — R05 Cough: Secondary | ICD-10-CM | POA: Diagnosis not present

## 2015-06-30 DIAGNOSIS — I69398 Other sequelae of cerebral infarction: Secondary | ICD-10-CM

## 2015-06-30 DIAGNOSIS — I63232 Cerebral infarction due to unspecified occlusion or stenosis of left carotid arteries: Secondary | ICD-10-CM

## 2015-06-30 DIAGNOSIS — R059 Cough, unspecified: Secondary | ICD-10-CM

## 2015-06-30 DIAGNOSIS — H539 Unspecified visual disturbance: Secondary | ICD-10-CM | POA: Diagnosis not present

## 2015-06-30 DIAGNOSIS — I6529 Occlusion and stenosis of unspecified carotid artery: Secondary | ICD-10-CM | POA: Diagnosis not present

## 2015-06-30 NOTE — Progress Notes (Signed)
Patient: Kayla Maxwell Female    DOB: 09-Dec-1945   71 y.o.   MRN: [id number] Visit Date: 06/30/2015  Today's Provider: Margarita Rana, MD   Chief Complaint  Patient presents with  . Cerebral Infarction    FU  . Cough    FU   Subjective:    HPI  Follow up for Cerebral Infarction  The patient was last seen for this 3 weeks ago. Changes made at last visit include changing Atorvastatin to 40 mg, refer to vascualr. Needs to recheck labs today.  She reports excellent compliance with treatment. She is not having side effects.  ------------------------------------------------------------------------------------    Follow up for Cough  The patient was last seen for this 3 weeks ago. Changes made at last visit include ordering CXR, which was negative.  She feels that condition is Unchanged. Pt is asking if the difficult airway found when pt had EGD could be causing cough. ------------------------------------------------------------------------------------       Allergies  Allergen Reactions  . Codeine Other (See Comments)    Makes her feel "crazy"  . Penicillins Swelling  . Adhesive [Tape] Hives  . Sulfa Antibiotics Nausea Only and Rash   Current Meds  Medication Sig  . aspirin EC 81 MG EC tablet Take 1 tablet (81 mg total) by mouth daily.  Marland Kitchen atorvastatin (LIPITOR) 40 MG tablet Take 1 tablet (40 mg total) by mouth daily at 6 PM.  . cetirizine (ZYRTEC) 10 MG tablet Take 10 mg by mouth daily.  Marland Kitchen escitalopram (LEXAPRO) 10 MG tablet Take 1 tablet (10 mg total) by mouth daily.  Marland Kitchen esomeprazole (NEXIUM) 40 MG capsule Take 40 mg by mouth daily at 12 noon. Reported on 06/09/2015  . ibuprofen (ADVIL,MOTRIN) 600 MG tablet Take 600 mg by mouth every 6 (six) hours as needed.  . metoprolol tartrate (LOPRESSOR) 25 MG tablet Take 25 mg by mouth 2 (two) times daily. Reported on 06/09/2015  . montelukast (SINGULAIR) 10 MG tablet Take 1 tablet (10 mg total) by mouth at bedtime.  .  rivaroxaban (XARELTO) 20 MG TABS tablet Take 20 mg by mouth daily with supper.    Review of Systems  Constitutional: Negative for fever, chills, diaphoresis, activity change, appetite change, fatigue and unexpected weight change.  Eyes: Positive for visual disturbance (in left eye S/P CVA).  Respiratory: Positive for cough and shortness of breath. Negative for chest tightness and wheezing.   Cardiovascular: Negative for chest pain, palpitations and leg swelling.  Musculoskeletal: Positive for arthralgias (bilateral hips radiating to thighs).    Social History  Substance Use Topics  . Smoking status: Former Smoker -- 1.00 packs/day for 45 years    Quit date: 08/19/2014  . Smokeless tobacco: Former Systems developer    Quit date: 08/19/2014  . Alcohol Use: No     Comment: Rarely   Objective:   BP 132/84 mmHg  Pulse 76  Temp(Src) 97.6 F (36.4 C) (Oral)  Resp 16  Wt 192 lb (87.091 kg)  SpO2 95%  Physical Exam  Constitutional: She appears well-developed and well-nourished.  Cardiovascular: Normal rate and normal heart sounds.  An irregular rhythm present.  Pulmonary/Chest: Effort normal and breath sounds normal. No respiratory distress.  Psychiatric: She has a normal mood and affect. Her behavior is normal.        Assessment & Plan:     1. Cough Worsening. Unclear etiology. Will refer to pulmonology.  Spirometry shows restriction.   - Spirometry with Graph - Ambulatory  referral to Pulmonology  2. Visual disturbance as complication of stroke New problem. Refer to ophthalmology as below. - Ambulatory referral to Ophthalmology  3. Cerebral infarction due to occlusion of left carotid artery ([place]) Recheck labs as below. FU pending results. - Lipid panel - Comprehensive metabolic panel     Patient seen and examined by Jerrell Belfast, MD, and note scribed by Renaldo Fiddler, CMA.   I have reviewed the document for accuracy and completeness and I agree with above. - Jerrell Belfast,  MD   Margarita Rana, MD  [facility]

## 2015-06-30 NOTE — Telephone Encounter (Signed)
Pt advised. Harutyun Monteverde Drozdowski, CMA  

## 2015-06-30 NOTE — Telephone Encounter (Signed)
Needs to see eye doctor. Thanks.

## 2015-06-30 NOTE — Telephone Encounter (Signed)
Pt calling stating she came in this morning and  forgot to ask Dr Venia Minks when will she be able to drive again. Please return pt call.  Thanks CC

## 2015-07-01 ENCOUNTER — Telehealth: Payer: Self-pay

## 2015-07-01 LAB — COMPREHENSIVE METABOLIC PANEL
ALT: 16 IU/L (ref 0–32)
AST: 16 IU/L (ref 0–40)
Albumin/Globulin Ratio: 1.6 (ref 1.2–2.2)
Albumin: 4.2 g/dL (ref 3.5–4.8)
Alkaline Phosphatase: 82 IU/L (ref 39–117)
BUN/Creatinine Ratio: 20 (ref 12–28)
BUN: 22 mg/dL (ref 8–27)
Bilirubin Total: 0.6 mg/dL (ref 0.0–1.2)
CO2: 26 mmol/L (ref 18–29)
Calcium: 9.6 mg/dL (ref 8.7–10.3)
Chloride: 99 mmol/L (ref 96–106)
Creatinine, Ser: 1.12 mg/dL — ABNORMAL HIGH (ref 0.57–1.00)
GFR calc Af Amer: 58 mL/min/{1.73_m2} — ABNORMAL LOW (ref >59)
GFR calc non Af Amer: 50 mL/min/{1.73_m2} — ABNORMAL LOW (ref >59)
Globulin, Total: 2.7 g/dL (ref 1.5–4.5)
Glucose: 103 mg/dL — ABNORMAL HIGH (ref 65–99)
Potassium: 4.7 mmol/L (ref 3.5–5.2)
Sodium: 138 mmol/L (ref 134–144)
Total Protein: 6.9 g/dL (ref 6.0–8.5)

## 2015-07-01 LAB — LIPID PANEL
Chol/HDL Ratio: 2.5 ratio units (ref 0.0–4.4)
Cholesterol, Total: 138 mg/dL (ref 100–199)
HDL: 55 mg/dL (ref >39)
LDL Calculated: 47 mg/dL (ref 0–99)
Triglycerides: 180 mg/dL — ABNORMAL HIGH (ref 0–149)
VLDL Cholesterol Cal: 36 mg/dL (ref 5–40)

## 2015-07-01 NOTE — Telephone Encounter (Signed)
-----   Message from Margarita Rana, MD sent at 07/01/2015  9:46 AM EDT ----- Labs stable. Please notify patient. Thanks.

## 2015-07-01 NOTE — Telephone Encounter (Signed)
Advised pt's daughter Dora Sims as below. Renaldo Fiddler, CMA

## 2015-07-02 DIAGNOSIS — R0602 Shortness of breath: Secondary | ICD-10-CM | POA: Diagnosis not present

## 2015-07-03 DIAGNOSIS — R05 Cough: Secondary | ICD-10-CM | POA: Diagnosis not present

## 2015-07-03 DIAGNOSIS — M549 Dorsalgia, unspecified: Secondary | ICD-10-CM | POA: Diagnosis not present

## 2015-07-03 DIAGNOSIS — I10 Essential (primary) hypertension: Secondary | ICD-10-CM | POA: Diagnosis not present

## 2015-07-03 DIAGNOSIS — H2513 Age-related nuclear cataract, bilateral: Secondary | ICD-10-CM | POA: Diagnosis not present

## 2015-07-03 DIAGNOSIS — I70213 Atherosclerosis of native arteries of extremities with intermittent claudication, bilateral legs: Secondary | ICD-10-CM | POA: Diagnosis not present

## 2015-07-03 DIAGNOSIS — M1990 Unspecified osteoarthritis, unspecified site: Secondary | ICD-10-CM | POA: Diagnosis not present

## 2015-07-07 DIAGNOSIS — K219 Gastro-esophageal reflux disease without esophagitis: Secondary | ICD-10-CM | POA: Diagnosis not present

## 2015-07-07 DIAGNOSIS — I4891 Unspecified atrial fibrillation: Secondary | ICD-10-CM | POA: Diagnosis not present

## 2015-07-07 DIAGNOSIS — R0602 Shortness of breath: Secondary | ICD-10-CM | POA: Diagnosis not present

## 2015-07-07 DIAGNOSIS — G4733 Obstructive sleep apnea (adult) (pediatric): Secondary | ICD-10-CM | POA: Diagnosis not present

## 2015-07-07 DIAGNOSIS — R05 Cough: Secondary | ICD-10-CM | POA: Diagnosis not present

## 2015-07-14 ENCOUNTER — Ambulatory Visit: Payer: Medicare Other | Admitting: General Surgery

## 2015-07-14 DIAGNOSIS — I482 Chronic atrial fibrillation: Secondary | ICD-10-CM | POA: Diagnosis not present

## 2015-07-14 DIAGNOSIS — E782 Mixed hyperlipidemia: Secondary | ICD-10-CM | POA: Diagnosis not present

## 2015-07-14 DIAGNOSIS — I340 Nonrheumatic mitral (valve) insufficiency: Secondary | ICD-10-CM | POA: Diagnosis not present

## 2015-07-14 DIAGNOSIS — I071 Rheumatic tricuspid insufficiency: Secondary | ICD-10-CM | POA: Diagnosis not present

## 2015-07-16 ENCOUNTER — Ambulatory Visit (INDEPENDENT_AMBULATORY_CARE_PROVIDER_SITE_OTHER): Payer: Medicare Other | Admitting: General Surgery

## 2015-07-16 ENCOUNTER — Encounter: Payer: Self-pay | Admitting: General Surgery

## 2015-07-16 VITALS — BP 136/84 | HR 74 | Resp 14 | Ht 64.0 in | Wt 193.0 lb

## 2015-07-16 DIAGNOSIS — K439 Ventral hernia without obstruction or gangrene: Secondary | ICD-10-CM

## 2015-07-16 NOTE — Patient Instructions (Addendum)
The patient is aware to call back for any questions or concerns.  

## 2015-07-16 NOTE — Progress Notes (Signed)
Patient ID: Kayla Maxwell, female   DOB: 23-Feb-1945, 70 y.o.   MRN: [id number]  She is here today for postoperative visit, ventral hernia on 05-26-15. She states she is doing well. She sees Dr. Delana Meyer in August for further assessment of her PAD. I have reviewed the history of present illness with the patient.   Abdomen soft and non tender. Incision is well healed, and repair intact.   Follow up as needed.  PCP:  Venia Minks This information has been scribed by Karie Fetch RN, BSN,BC.

## 2015-07-18 ENCOUNTER — Encounter: Payer: Self-pay | Admitting: General Surgery

## 2015-08-05 ENCOUNTER — Encounter: Payer: Self-pay | Admitting: General Surgery

## 2015-08-07 ENCOUNTER — Other Ambulatory Visit: Payer: Self-pay | Admitting: Vascular Surgery

## 2015-08-07 DIAGNOSIS — M549 Dorsalgia, unspecified: Secondary | ICD-10-CM | POA: Diagnosis not present

## 2015-08-07 DIAGNOSIS — M79609 Pain in unspecified limb: Secondary | ICD-10-CM | POA: Diagnosis not present

## 2015-08-07 DIAGNOSIS — I499 Cardiac arrhythmia, unspecified: Secondary | ICD-10-CM | POA: Diagnosis not present

## 2015-08-07 DIAGNOSIS — I639 Cerebral infarction, unspecified: Secondary | ICD-10-CM | POA: Diagnosis not present

## 2015-08-07 DIAGNOSIS — I10 Essential (primary) hypertension: Secondary | ICD-10-CM | POA: Diagnosis not present

## 2015-08-07 DIAGNOSIS — I6523 Occlusion and stenosis of bilateral carotid arteries: Secondary | ICD-10-CM | POA: Diagnosis not present

## 2015-08-07 DIAGNOSIS — I739 Peripheral vascular disease, unspecified: Secondary | ICD-10-CM | POA: Diagnosis not present

## 2015-08-07 DIAGNOSIS — R05 Cough: Secondary | ICD-10-CM | POA: Diagnosis not present

## 2015-08-07 DIAGNOSIS — M1990 Unspecified osteoarthritis, unspecified site: Secondary | ICD-10-CM | POA: Diagnosis not present

## 2015-08-07 DIAGNOSIS — I70213 Atherosclerosis of native arteries of extremities with intermittent claudication, bilateral legs: Secondary | ICD-10-CM | POA: Diagnosis not present

## 2015-08-11 ENCOUNTER — Other Ambulatory Visit
Admission: RE | Admit: 2015-08-11 | Discharge: 2015-08-11 | Disposition: A | Discharge status: Home / Self Care | Payer: Medicare Other | Source: Ambulatory Visit | Attending: Vascular Surgery | Admitting: Vascular Surgery

## 2015-08-11 DIAGNOSIS — Z0000 Encounter for general adult medical examination without abnormal findings: Secondary | ICD-10-CM | POA: Diagnosis not present

## 2015-08-11 LAB — CREATININE, SERUM
Creatinine, Ser: 1.08 mg/dL — ABNORMAL HIGH (ref 0.44–1.00)
GFR calc Af Amer: 59 mL/min — ABNORMAL LOW (ref >60)
GFR calc non Af Amer: 51 mL/min — ABNORMAL LOW (ref >60)

## 2015-08-11 LAB — BUN: BUN: 19 mg/dL (ref 6–20)

## 2015-08-12 ENCOUNTER — Encounter
Admission: RE | Disposition: A | Discharge status: Home / Self Care | Payer: Self-pay | Source: Ambulatory Visit | Attending: Vascular Surgery

## 2015-08-12 ENCOUNTER — Ambulatory Visit: Payer: Medicare Other | Admitting: Physician Assistant

## 2015-08-12 ENCOUNTER — Encounter: Payer: Self-pay | Admitting: *Deleted

## 2015-08-12 ENCOUNTER — Ambulatory Visit
Admission: RE | Admit: 2015-08-12 | Discharge: 2015-08-12 | Disposition: A | Discharge status: Home / Self Care | Payer: Medicare Other | Source: Ambulatory Visit | Attending: Vascular Surgery | Admitting: Vascular Surgery

## 2015-08-12 DIAGNOSIS — I6529 Occlusion and stenosis of unspecified carotid artery: Secondary | ICD-10-CM | POA: Diagnosis not present

## 2015-08-12 DIAGNOSIS — Z7982 Long term (current) use of aspirin: Secondary | ICD-10-CM | POA: Diagnosis not present

## 2015-08-12 DIAGNOSIS — I639 Cerebral infarction, unspecified: Secondary | ICD-10-CM | POA: Diagnosis not present

## 2015-08-12 DIAGNOSIS — Z880 Allergy status to penicillin: Secondary | ICD-10-CM | POA: Diagnosis not present

## 2015-08-12 DIAGNOSIS — I999 Unspecified disorder of circulatory system: Secondary | ICD-10-CM | POA: Diagnosis not present

## 2015-08-12 DIAGNOSIS — Z882 Allergy status to sulfonamides status: Secondary | ICD-10-CM | POA: Diagnosis not present

## 2015-08-12 DIAGNOSIS — E669 Obesity, unspecified: Secondary | ICD-10-CM | POA: Insufficient documentation

## 2015-08-12 DIAGNOSIS — Z90710 Acquired absence of both cervix and uterus: Secondary | ICD-10-CM | POA: Insufficient documentation

## 2015-08-12 DIAGNOSIS — I6523 Occlusion and stenosis of bilateral carotid arteries: Secondary | ICD-10-CM | POA: Diagnosis not present

## 2015-08-12 DIAGNOSIS — Z6832 Body mass index (BMI) 32.0-32.9, adult: Secondary | ICD-10-CM | POA: Insufficient documentation

## 2015-08-12 DIAGNOSIS — Z9109 Other allergy status, other than to drugs and biological substances: Secondary | ICD-10-CM | POA: Insufficient documentation

## 2015-08-12 DIAGNOSIS — I70212 Atherosclerosis of native arteries of extremities with intermittent claudication, left leg: Secondary | ICD-10-CM | POA: Diagnosis not present

## 2015-08-12 DIAGNOSIS — Z9049 Acquired absence of other specified parts of digestive tract: Secondary | ICD-10-CM | POA: Diagnosis not present

## 2015-08-12 DIAGNOSIS — M7989 Other specified soft tissue disorders: Secondary | ICD-10-CM | POA: Insufficient documentation

## 2015-08-12 DIAGNOSIS — R05 Cough: Secondary | ICD-10-CM | POA: Diagnosis not present

## 2015-08-12 DIAGNOSIS — I499 Cardiac arrhythmia, unspecified: Secondary | ICD-10-CM | POA: Insufficient documentation

## 2015-08-12 DIAGNOSIS — M79609 Pain in unspecified limb: Secondary | ICD-10-CM | POA: Diagnosis not present

## 2015-08-12 DIAGNOSIS — Z8249 Family history of ischemic heart disease and other diseases of the circulatory system: Secondary | ICD-10-CM | POA: Diagnosis not present

## 2015-08-12 DIAGNOSIS — M1990 Unspecified osteoarthritis, unspecified site: Secondary | ICD-10-CM | POA: Diagnosis not present

## 2015-08-12 DIAGNOSIS — M2560 Stiffness of unspecified joint, not elsewhere classified: Secondary | ICD-10-CM | POA: Diagnosis not present

## 2015-08-12 DIAGNOSIS — Z7902 Long term (current) use of antithrombotics/antiplatelets: Secondary | ICD-10-CM | POA: Diagnosis not present

## 2015-08-12 DIAGNOSIS — I10 Essential (primary) hypertension: Secondary | ICD-10-CM | POA: Diagnosis not present

## 2015-08-12 DIAGNOSIS — Z885 Allergy status to narcotic agent status: Secondary | ICD-10-CM | POA: Diagnosis not present

## 2015-08-12 DIAGNOSIS — I70213 Atherosclerosis of native arteries of extremities with intermittent claudication, bilateral legs: Secondary | ICD-10-CM | POA: Diagnosis not present

## 2015-08-12 DIAGNOSIS — M549 Dorsalgia, unspecified: Secondary | ICD-10-CM | POA: Diagnosis not present

## 2015-08-12 DIAGNOSIS — I739 Peripheral vascular disease, unspecified: Secondary | ICD-10-CM | POA: Diagnosis not present

## 2015-08-12 HISTORY — PX: PERIPHERAL VASCULAR CATHETERIZATION: SHX172C

## 2015-08-12 SURGERY — LOWER EXTREMITY ANGIOGRAPHY
Anesthesia: Moderate Sedation | Site: Leg Lower | Laterality: Left

## 2015-08-12 MED ORDER — HEPARIN SODIUM (PORCINE) 1000 UNIT/ML IJ SOLN
INTRAMUSCULAR | Status: AC
  Filled 2015-08-12: qty 1

## 2015-08-12 MED ORDER — FENTANYL CITRATE (PF) 100 MCG/2ML IJ SOLN
INTRAMUSCULAR | Status: AC
Start: 2015-08-12 — End: 2015-08-12
  Filled 2015-08-12: qty 2

## 2015-08-12 MED ORDER — HYDROMORPHONE HCL 1 MG/ML IJ SOLN: 1.0000 mg | Freq: Once | INTRAMUSCULAR | Status: DC

## 2015-08-12 MED ORDER — HEPARIN SODIUM (PORCINE) 1000 UNIT/ML IJ SOLN
INTRAMUSCULAR | Status: DC | PRN
  Administered 2015-08-12: 4000 [IU] via INTRAVENOUS

## 2015-08-12 MED ORDER — MORPHINE SULFATE (PF) 4 MG/ML IV SOLN: 2.0000 mg | INTRAVENOUS | Status: DC | PRN

## 2015-08-12 MED ORDER — LIDOCAINE HCL (PF) 1 % IJ SOLN
INTRAMUSCULAR | Status: AC
  Filled 2015-08-12: qty 30

## 2015-08-12 MED ORDER — CLINDAMYCIN PHOSPHATE 300 MG/50ML IV SOLN
INTRAVENOUS | Status: AC
  Filled 2015-08-12: qty 50

## 2015-08-12 MED ORDER — MIDAZOLAM HCL 2 MG/2ML IJ SOLN
INTRAMUSCULAR | Status: DC | PRN
  Administered 2015-08-12 (×2): 1 mg via INTRAVENOUS
  Administered 2015-08-12: 2 mg via INTRAVENOUS
  Administered 2015-08-12: 1 mg via INTRAVENOUS

## 2015-08-12 MED ORDER — PANTOPRAZOLE SODIUM 40 MG PO TBEC: 40.0000 mg | DELAYED_RELEASE_TABLET | Freq: Every day | ORAL | Status: DC

## 2015-08-12 MED ORDER — CLOPIDOGREL BISULFATE 75 MG PO TABS: 75.0000 mg | ORAL_TABLET | Freq: Every day | ORAL | 11 refills | Status: DC

## 2015-08-12 MED ORDER — HEPARIN (PORCINE) IN NACL 2-0.9 UNIT/ML-% IJ SOLN
INTRAMUSCULAR | Status: AC
  Filled 2015-08-12: qty 1000

## 2015-08-12 MED ORDER — MIDAZOLAM HCL 2 MG/2ML IJ SOLN
INTRAMUSCULAR | Status: AC
  Filled 2015-08-12: qty 2

## 2015-08-12 MED ORDER — SODIUM CHLORIDE 0.9 % IV SOLN: Freq: Once | INTRAVENOUS | Status: DC

## 2015-08-12 MED ORDER — DOCUSATE SODIUM 100 MG PO CAPS: 100.0000 mg | ORAL_CAPSULE | Freq: Every day | ORAL | Status: DC

## 2015-08-12 MED ORDER — METOPROLOL TARTRATE 5 MG/5ML IV SOLN: 5.0000 mg | Freq: Four times a day (QID) | INTRAVENOUS | Status: DC

## 2015-08-12 MED ORDER — ONDANSETRON HCL 4 MG/2ML IJ SOLN: 4.0000 mg | Freq: Four times a day (QID) | INTRAMUSCULAR | Status: DC | PRN

## 2015-08-12 MED ORDER — CLOPIDOGREL BISULFATE 75 MG PO TABS
300.0000 mg | ORAL_TABLET | ORAL | Status: AC
  Administered 2015-08-12: 300 mg via ORAL

## 2015-08-12 MED ORDER — ACETAMINOPHEN 325 MG RE SUPP
325.0000 mg | RECTAL | Status: DC | PRN
  Filled 2015-08-12: qty 2

## 2015-08-12 MED ORDER — METHYLPREDNISOLONE SODIUM SUCC 125 MG IJ SOLR: 125.0000 mg | INTRAMUSCULAR | Status: DC | PRN

## 2015-08-12 MED ORDER — ACETAMINOPHEN 325 MG PO TABS: 325.0000 mg | ORAL_TABLET | ORAL | Status: DC | PRN

## 2015-08-12 MED ORDER — FENTANYL CITRATE (PF) 100 MCG/2ML IJ SOLN
INTRAMUSCULAR | Status: DC | PRN
  Administered 2015-08-12 (×3): 50 ug via INTRAVENOUS

## 2015-08-12 MED ORDER — HYDRALAZINE HCL 20 MG/ML IJ SOLN: 5.0000 mg | INTRAMUSCULAR | Status: DC | PRN

## 2015-08-12 MED ORDER — IOPAMIDOL (ISOVUE-300) INJECTION 61%
INTRAVENOUS | Status: DC | PRN
  Administered 2015-08-12: 90 mL via INTRAVENOUS

## 2015-08-12 MED ORDER — ALUM & MAG HYDROXIDE-SIMETH 200-200-20 MG/5ML PO SUSP: 15.0000 mL | ORAL | Status: DC | PRN

## 2015-08-12 MED ORDER — FAMOTIDINE 20 MG PO TABS: 40.0000 mg | ORAL_TABLET | ORAL | Status: DC | PRN

## 2015-08-12 MED ORDER — SODIUM CHLORIDE 0.9 % IV SOLN
INTRAVENOUS | Status: DC
  Administered 2015-08-12: 09:00:00 via INTRAVENOUS

## 2015-08-12 MED ORDER — FENTANYL CITRATE (PF) 100 MCG/2ML IJ SOLN
INTRAMUSCULAR | Status: AC
  Filled 2015-08-12: qty 2

## 2015-08-12 MED ORDER — CLINDAMYCIN PHOSPHATE 300 MG/50ML IV SOLN
300.0000 mg | Freq: Once | INTRAVENOUS | Status: AC
  Administered 2015-08-12: 300 mg via INTRAVENOUS

## 2015-08-12 MED ORDER — LABETALOL HCL 5 MG/ML IV SOLN: 10.0000 mg | INTRAVENOUS | Status: DC | PRN

## 2015-08-12 MED ORDER — CLOPIDOGREL BISULFATE 75 MG PO TABS
ORAL_TABLET | ORAL | Status: AC
  Administered 2015-08-12: 300 mg via ORAL
  Filled 2015-08-12: qty 4

## 2015-08-12 MED ORDER — OXYCODONE HCL 5 MG PO TABS
5.0000 mg | ORAL_TABLET | ORAL | Status: DC | PRN
  Filled 2015-08-12: qty 2

## 2015-08-12 MED ORDER — MIDAZOLAM HCL 2 MG/2ML IJ SOLN
INTRAMUSCULAR | Status: AC
  Filled 2015-08-12: qty 4

## 2015-08-12 SURGICAL SUPPLY — 22 items
BALLN LUTONIX DCB 6X40X130 (BALLOONS) ×2
BALLN LUTONIX DCB 6X80X130 (BALLOONS) ×2
BALLN LUTONIX DCB 7X40X130 (BALLOONS) ×4
BALLOON LUTONIX DCB 6X40X130 (BALLOONS) ×1 IMPLANT
BALLOON LUTONIX DCB 6X80X130 (BALLOONS) ×1 IMPLANT
BALLOON LUTONIX DCB 7X40X130 (BALLOONS) ×2 IMPLANT
CATH PIG 70CM (CATHETERS) ×2 IMPLANT
CATH TORCON 5FR 0.38 (CATHETERS) ×2 IMPLANT
DEVICE PRESTO INFLATION (MISCELLANEOUS) ×4 IMPLANT
DEVICE STARCLOSE SE CLOSURE (Vascular Products) ×4 IMPLANT
GLIDEWIRE STIFF .35X180X3 HYDR (WIRE) ×2 IMPLANT
PACK ANGIOGRAPHY (CUSTOM PROCEDURE TRAY) ×2 IMPLANT
SET INTRO CAPELLA COAXIAL (SET/KITS/TRAYS/PACK) ×2 IMPLANT
SHEATH BRITE TIP 5FRX11 (SHEATH) ×2 IMPLANT
SHEATH BRITE TIP 6FRX11 (SHEATH) ×4 IMPLANT
SHEATH BRITE TIP 7FRX11 (SHEATH) ×2 IMPLANT
STENT LIFESTREAM 6X16X80 (Permanent Stent) ×2 IMPLANT
STENT LIFESTREAM 7X58X80 (Permanent Stent) ×2 IMPLANT
SYR MEDRAD MARK V 150ML (SYRINGE) ×2 IMPLANT
TUBING CONTRAST HIGH PRESS 72 (TUBING) ×2 IMPLANT
WIRE J 3MM .035X145CM (WIRE) ×2 IMPLANT
WIRE MAGIC TOR.035 180C (WIRE) ×4 IMPLANT

## 2015-08-12 NOTE — Op Note (Signed)
[facility]  Percutaneous Study/Intervention Procedural Note   Date of Surgery: 08/12/2015  Surgeon:Bennye Nix, Dolores Lory   Pre-operative Diagnosis: Atherosclerotic occlusive disease bilateral lower extremities with lifestyle limiting claudication left leg  Post-operative diagnosis:  Same  Procedure(s) Performed:  1.  Abdominal aortogram with left lower extremity distal runoff   2.  Introduction catheter into aorta bilateral femoral approach  3.  Percutaneous transluminal angioplasty right common iliac artery; "kissing balloon" technique  4.  Percutaneous transluminal and plasty and stent placement left common iliac artery; "kissing balloon" technique  5.  Ultrasound guided access bilateral common femoral arteries  6.  StarClose closure device bilateral common femoral arteries  Anesthesia: Conscious sedation was administered under my direct supervision. IV Versed plus fentanyl were utilized. Continuous ECG, pulse oximetry and blood pressure was monitored throughout the entire procedure. Conscious sedation was for a total of 70 minutes.  Sheath: 6 French sheath retrograde right common femoral; 7 French sheath retrograde left common femoral  Contrast: 90 cc Isovue  Fluoroscopy Time: 8.4  Indications:  Patient presents to the office with increasing pain in her left lower extremity. She is adamantly voicing lifestyle limiting symptoms and wishes to undergo treatment. Noninvasive studies as well as physical examination are consistent with iliac stenosis. The risks and benefits for angiography with intervention are reviewed all questions answered patient agrees to proceed.  Procedure:  Kayla Maxwell a 70 y.o. female who was identified and appropriate procedural time out was performed.  The patient was then placed supine on the table and prepped and draped in the usual sterile fashion.  Ultrasound was used to evaluate the right common femoral artery.  It was patent .  A  digital ultrasound image was acquired.  A micropuncture needle was used to access the right common femoral artery under direct ultrasound guidance and a permanent image was recorded.  The microwire followed by micro-sheath was then inserted. A 0.035 J wire was advanced without resistance and a 5Fr sheath was placed.    The pigtail catheter was then positioned at the level of T12 and an AP image of the aorta was obtained. After review the images the pigtail catheter was repositioned above the aortic bifurcation and bilateral oblique views of the pelvis were obtained.   After review the images the ultrasound was reprepped and delivered back onto the sterile field. The left common femoral was then imaged with the ultrasound it was noted to be echolucent and pulsatile indicating patency. Images recorded for the permanent record. Under real-time visualization a microneedle was inserted into the anterior wall the common femoral artery microwire was then advanced without difficulty under fluoroscopic guidance followed by placement of the micro-sheath.  6 French sheath was then placed.  A stiff angled Glidewire with a Kumpe catheter was then negotiated under fluoroscopic guidance into the aorta. Injection of contrast was used to verify intraluminal placement  4000 Units of heparin was given and allowed to circulate for proximally 4 minutes.  The right sheath was then upsized to a 6 [place] sheath as well after a Magic torque wire was advanced through the pigtail catheter. Magnified images of the aortic bifurcation were then made using hand injection contrast from the femoral sheaths. Initially a 6 x 40 Lutonix balloon was advanced up the right side and a 6 x 80 Lutonix balloon was advanced up the left side. Inflation was to 10 atm for 1 minute. Follow-up imaging then demonstrated persistent stenosis in the left common iliac. After  appropriate sizing a 7 x 58 Lifestream stent was selected for the left and a 6 x 19  Lifestream stent was also selected for the left. There were then advanced and positioned just at the aortic bifurcation extending down to the iliac bifurcation on the left. Simultaneously the 6 mm balloon was advanced up the right. The balloon and the stent within pressurized to 10 atm simultaneously for 1 full minute. The second stent was then placed as noted above. Insufflation for full expansion of the stents was performed simultaneously. 2 7 x 40 Lutonix balloons were then utilized at the aortic bifurcation and subsequently as the left balloon was withdrawn the 6 mm stent was postdilated to 7 mm.   The pigtail catheter was then introduced up the right and bolus injection of contrast was used to perform final imaging of the distal aortic reconstruction. Follow-up imaging was then performed and distal runoff to the left was obtained through hand injection of the left sheath.   Oblique views were then obtained of the groins in succession and Star close device is deployed without difficulty. There were no immediate complications   Findings:   Aortogram:  The abdominal aorta is opacified with a bolus injection contrast. Demonstrates diffuse disease but there are no hemodynamically significant lesions noted until the distal aortic bifurcation where the left common iliac is occluded there is reconstitution at the iliac bifurcation. The right common iliac demonstrates moderate atherosclerotic disease bilateral external iliac arteries are patent.  Right Lower Extremity:  The right common femoral is patent as is the origins of the SFA and profunda.  Left Lower Extremity:  The left common femoral profunda femoris and superficial femoral and popliteal are widely patent. Trifurcation is patent and there is three-vessel runoff to the foot  Following placement of the left iliac stents there is now wide patency with less than 10% residual stenosis with rapid flow through the aortic bifurcation bilaterally. The right  common iliac is now been fully expanded is well no stent is required. Distal runoff is preserved  Summary:  Successful reconstruction of the distal aorta and bilateral iliac arteries  Disposition: Patient was taken to the recovery room in stable condition having tolerated the procedure well.  Kayla Maxwell, Dolores Lory 08/12/2015,12:06 PM

## 2015-08-12 NOTE — H&P (Signed)
[facility] History & Physical Update  The patient was interviewed and re-examined.  The patient's previous History and Physical has been reviewed and is unchanged.  There is no change in the plan of care. We plan to proceed with the scheduled procedure.  Schnier, Dolores Lory, MD  08/12/2015, 8:39 AM

## 2015-08-12 NOTE — OR Nursing (Signed)
300 CC fluid bolus given r/t GFR , [facility] office called and PA aware

## 2015-08-12 NOTE — OR Nursing (Signed)
Sleep apnea, respiratory called to set up CPAP

## 2015-08-13 ENCOUNTER — Encounter: Payer: Self-pay | Admitting: Vascular Surgery

## 2015-08-28 DIAGNOSIS — I70212 Atherosclerosis of native arteries of extremities with intermittent claudication, left leg: Secondary | ICD-10-CM | POA: Diagnosis not present

## 2015-08-28 DIAGNOSIS — I6523 Occlusion and stenosis of bilateral carotid arteries: Secondary | ICD-10-CM | POA: Diagnosis not present

## 2015-08-28 DIAGNOSIS — M549 Dorsalgia, unspecified: Secondary | ICD-10-CM | POA: Diagnosis not present

## 2015-08-28 DIAGNOSIS — E785 Hyperlipidemia, unspecified: Secondary | ICD-10-CM | POA: Diagnosis not present

## 2015-08-28 DIAGNOSIS — I739 Peripheral vascular disease, unspecified: Secondary | ICD-10-CM | POA: Diagnosis not present

## 2015-08-28 DIAGNOSIS — M1990 Unspecified osteoarthritis, unspecified site: Secondary | ICD-10-CM | POA: Diagnosis not present

## 2015-08-28 DIAGNOSIS — R05 Cough: Secondary | ICD-10-CM | POA: Diagnosis not present

## 2015-08-28 DIAGNOSIS — I10 Essential (primary) hypertension: Secondary | ICD-10-CM | POA: Diagnosis not present

## 2015-08-28 DIAGNOSIS — M79609 Pain in unspecified limb: Secondary | ICD-10-CM | POA: Diagnosis not present

## 2015-08-28 DIAGNOSIS — I639 Cerebral infarction, unspecified: Secondary | ICD-10-CM | POA: Diagnosis not present

## 2015-08-28 DIAGNOSIS — I499 Cardiac arrhythmia, unspecified: Secondary | ICD-10-CM | POA: Diagnosis not present

## 2015-08-28 DIAGNOSIS — I70213 Atherosclerosis of native arteries of extremities with intermittent claudication, bilateral legs: Secondary | ICD-10-CM | POA: Diagnosis not present

## 2015-09-09 DIAGNOSIS — L308 Other specified dermatitis: Secondary | ICD-10-CM | POA: Diagnosis not present

## 2015-09-09 DIAGNOSIS — L218 Other seborrheic dermatitis: Secondary | ICD-10-CM | POA: Diagnosis not present

## 2015-09-09 DIAGNOSIS — D485 Neoplasm of uncertain behavior of skin: Secondary | ICD-10-CM | POA: Diagnosis not present

## 2015-10-03 DIAGNOSIS — Z23 Encounter for immunization: Secondary | ICD-10-CM | POA: Diagnosis not present

## 2015-10-07 DIAGNOSIS — L2389 Allergic contact dermatitis due to other agents: Secondary | ICD-10-CM | POA: Diagnosis not present

## 2015-11-10 DIAGNOSIS — I482 Chronic atrial fibrillation: Secondary | ICD-10-CM | POA: Diagnosis not present

## 2015-11-10 DIAGNOSIS — E782 Mixed hyperlipidemia: Secondary | ICD-10-CM | POA: Diagnosis not present

## 2015-11-10 DIAGNOSIS — I70219 Atherosclerosis of native arteries of extremities with intermittent claudication, unspecified extremity: Secondary | ICD-10-CM | POA: Diagnosis not present

## 2015-11-10 DIAGNOSIS — I70229 Atherosclerosis of native arteries of extremities with rest pain, unspecified extremity: Secondary | ICD-10-CM | POA: Insufficient documentation

## 2015-11-10 DIAGNOSIS — G4733 Obstructive sleep apnea (adult) (pediatric): Secondary | ICD-10-CM | POA: Diagnosis not present

## 2015-11-26 ENCOUNTER — Other Ambulatory Visit: Payer: Self-pay | Admitting: Family Medicine

## 2015-11-26 DIAGNOSIS — F419 Anxiety disorder, unspecified: Secondary | ICD-10-CM

## 2015-11-26 NOTE — Telephone Encounter (Signed)
Patient needs appointment to assess this.

## 2015-11-26 NOTE — Telephone Encounter (Signed)
Pt advised, she had to cancel her last appt due to having surgery that day, she will make an appointment-aa

## 2015-11-26 NOTE — Telephone Encounter (Signed)
Please review-aa 

## 2015-12-09 ENCOUNTER — Other Ambulatory Visit: Payer: Self-pay | Admitting: Family Medicine

## 2015-12-09 DIAGNOSIS — I63232 Cerebral infarction due to unspecified occlusion or stenosis of left carotid arteries: Secondary | ICD-10-CM

## 2016-01-02 ENCOUNTER — Telehealth (INDEPENDENT_AMBULATORY_CARE_PROVIDER_SITE_OTHER): Payer: Self-pay | Admitting: Vascular Surgery

## 2016-01-02 NOTE — Telephone Encounter (Signed)
Patient called to move up appointment from March. Experiencing real bad pain left groin area near stent. I was able to move up her appointment to January 10th for her ultrasounds, but she is Stann in pain. Wants to know what she should do or take.

## 2016-01-09 ENCOUNTER — Telehealth: Payer: Self-pay | Admitting: Physician Assistant

## 2016-01-09 DIAGNOSIS — J0140 Acute pansinusitis, unspecified: Secondary | ICD-10-CM

## 2016-01-09 MED ORDER — AZITHROMYCIN 250 MG PO TABS: ORAL_TABLET | ORAL | 0 refills | Status: DC

## 2016-01-09 NOTE — Telephone Encounter (Signed)
X 4 days. Afebrile. Denies chest pain/tightness. Is c/o some H/A and sinus pressure. Pt is taking Singulair, without relief. Renaldo Fiddler, CMA

## 2016-01-09 NOTE — Telephone Encounter (Signed)
Pt states she has sinus congestion and coughing up green phlegm.  Pt is requesting a Rx to help with this. Pt states she does not want to come in.  [facility] [place].  [phone]

## 2016-01-09 NOTE — Telephone Encounter (Signed)
Pt advised. Emily Drozdowski, CMA  

## 2016-01-09 NOTE — Telephone Encounter (Signed)
zpak sent in but if not better she will need appt

## 2016-01-14 ENCOUNTER — Ambulatory Visit (INDEPENDENT_AMBULATORY_CARE_PROVIDER_SITE_OTHER): Payer: Medicare Other

## 2016-01-14 ENCOUNTER — Ambulatory Visit (INDEPENDENT_AMBULATORY_CARE_PROVIDER_SITE_OTHER): Payer: Medicare Other | Admitting: Vascular Surgery

## 2016-01-14 ENCOUNTER — Encounter (INDEPENDENT_AMBULATORY_CARE_PROVIDER_SITE_OTHER): Payer: Self-pay | Admitting: Vascular Surgery

## 2016-01-14 ENCOUNTER — Other Ambulatory Visit (INDEPENDENT_AMBULATORY_CARE_PROVIDER_SITE_OTHER): Payer: Self-pay | Admitting: Vascular Surgery

## 2016-01-14 VITALS — BP 129/89 | HR 74 | Resp 16 | Ht 65.0 in | Wt 205.0 lb

## 2016-01-14 DIAGNOSIS — I739 Peripheral vascular disease, unspecified: Secondary | ICD-10-CM | POA: Diagnosis not present

## 2016-01-14 DIAGNOSIS — E785 Hyperlipidemia, unspecified: Secondary | ICD-10-CM | POA: Diagnosis not present

## 2016-01-14 DIAGNOSIS — I70213 Atherosclerosis of native arteries of extremities with intermittent claudication, bilateral legs: Secondary | ICD-10-CM

## 2016-01-14 NOTE — Progress Notes (Signed)
Subjective:    Patient ID: Kayla Maxwell, female    DOB: 1945/10/16, 71 y.o.   MRN: [id number] Chief Complaint  Patient presents with  . Follow-up   Patient presents sooner that her originally scheduled six month follow up complaining of left groin pain. Patient has one episode of left groin pain which she took some ibuprofen and the pain resolved. The pain has not reoccurred. The patient underwent an ABI which showed no evidence of significant arterial disease bilaterally (on 08/28/15, Right ABI: 1.04 and Left 1.08). An aorto-iliac arterial duplex is notable for patent bilateral common iliac arteries. The patient denies any claudication like symptoms, rest pain or ulcers to her feet / toes.    Review of Systems  Constitutional: Negative.   HENT: Negative.   Eyes: Negative.   Respiratory: Negative.   Cardiovascular: Negative.   Gastrointestinal: Negative.   Endocrine: Negative.   Genitourinary: Negative.   Musculoskeletal: Negative.   Skin: Negative.   Allergic/Immunologic: Negative.   Neurological: Negative.   Hematological: Negative.   Psychiatric/Behavioral: Negative.       Objective:   Physical Exam  Constitutional: She is oriented to person, place, and time. She appears well-developed and well-nourished.  HENT:  Head: Normocephalic and atraumatic.  Right Ear: External ear normal.  Left Ear: External ear normal.  Eyes: Conjunctivae and EOM are normal. Pupils are equal, round, and reactive to light.  Neck: Normal range of motion.  Cardiovascular: Normal rate, regular rhythm, normal heart sounds and intact distal pulses.   Pulses:      Radial pulses are 2+ on the right side, and 2+ on the left side.       Dorsalis pedis pulses are 2+ on the right side, and 2+ on the left side.       Posterior tibial pulses are 2+ on the right side, and 2+ on the left side.  Pulmonary/Chest: Effort normal and breath sounds normal.  Abdominal: Soft. Bowel sounds are normal.    Musculoskeletal: Normal range of motion. She exhibits no edema.  Neurological: She is alert and oriented to person, place, and time.  Skin: Skin is warm and dry.  Psychiatric: She has a normal mood and affect. Her behavior is normal. Judgment and thought content normal.   BP 129/89   Pulse 74   Resp 16   Ht 5\' 5"  (1.651 m)   Wt 205 lb (93 kg)   BMI 34.11 kg/m   Past Medical History:  Diagnosis Date  . Acid reflux    takes Nexium daily  . Allergy    takes Singulair daily as needed  . Anemia   . Atrial fibrillation ([place])   . Cancer ([place]) AGE 14   CERVICAL cancer with removal  . Chronic back pain    stenosis  . Complication of anesthesia   . Constipation    takes Colace daily as needed  . Coronary artery disease   . Difficult intubation   . Dysrhythmia    atrial fibrillation dx 04/2014  . Headache    sinus   . History of blood transfusion   . History of shingles   . Hyperlipidemia    takes Pravastatin daily-started taking this 6 months ago  . Lumbar surgical wound fluid collection   . Sleep apnea    CPAP  . Stroke ([place])    TIA'S X 2 --- one last yr and one this yr...lost her memory over 4-6 hrs.  . Stroke ([place]) 06-04-15   left  with right side weakness  . TIA (transient ischemic attack)    showed up on an MRI but she never knew it  . Urinary frequency    Social History   Social History  . Marital status: Divorced    Spouse name: N/A  . Number of children: 1  . Years of education: College   Occupational History  . Retired    Social History Main Topics  . Smoking status: Former Smoker    Packs/day: 1.00    Years: 45.00    Quit date: 08/19/2014  . Smokeless tobacco: Former Systems developer    Quit date: 08/19/2014  . Alcohol use No     Comment: Rarely  . Drug use: No  . Sexual activity: Not on file   Other Topics Concern  . Not on file   Social History Narrative  . No narrative on file   Past Surgical History:  Procedure Laterality Date  . ABDOMINAL  HYSTERECTOMY    . APPENDECTOMY  1963  . BACK SURGERY  08/19/2014   Dr. Hal Neer ([place] [facility])  . BREAST SURGERY  1984   augementation  . [place]  . CHOLECYSTECTOMY  2014   Dr Jamal Collin  . COLONOSCOPY    . ESOPHAGOGASTRODUODENOSCOPY    . HAMMER TOE SURGERY Bilateral   . LUMBAR LAMINECTOMY WITH COFLEX 1 LEVEL Bilateral 07/04/2014   Procedure: Laminectomy and Foraminotomy - bilateral - Lumbar Four-Five with coflex ;  Surgeon: Karie Chimera, MD;  Location: [facility];  Service: Neurosurgery;  Laterality: Bilateral;  . LUMBAR WOUND DEBRIDEMENT N/A 08/19/2014   Procedure: Exploration of Lumbar wound;  Surgeon: Karie Chimera, MD;  Location: [facility];  Service: Neurosurgery;  Laterality: N/A;  . PERIPHERAL VASCULAR CATHETERIZATION Left 08/12/2015   Procedure: Lower Extremity Angiography;  Surgeon: Katha Cabal, MD;  Location: [facility];  Service: Cardiovascular;  Laterality: Left;  . TONSILLECTOMY    . VENTRAL HERNIA REPAIR N/A 05/26/2015   Procedure: HERNIA REPAIR VENTRAL ADULT;  Surgeon: Christene Lye, MD;  Location: [facility] ORS;  Service: General;  Laterality: N/A;   Family History  Problem Relation Age of Onset  . Alzheimer's disease Mother   . Heart attack Father   . Pancreatic cancer Sister   . Healthy Brother    Allergies  Allergen Reactions  . Codeine Other (See Comments)    Makes her feel "crazy"  . Penicillins Swelling    Has patient had a PCN reaction causing immediate rash, facial/tongue/throat swelling, SOB or lightheadedness with hypotension: unknown Has patient had a PCN reaction causing severe rash involving mucus membranes or skin necrosis: unknown Has patient had a PCN reaction that required hospitalization unknown Has patient had a PCN reaction occurring within the last 10 years: no If all of the above answers are "NO", then may proceed with Cephalosporin use.  . Adhesive [Tape] Hives  . Sulfa Antibiotics Nausea Only and Rash       Assessment & Plan:  Patient presents sooner that her originally scheduled six month follow up complaining of left groin pain. Patient has one episode of left groin pain which she took some ibuprofen and the pain resolved. The pain has not reoccurred. The patient underwent an ABI which showed no evidence of significant arterial disease bilaterally (on 08/28/15, Right ABI: 1.04 and Left 1.08). An aorto-iliac arterial duplex is notable for patent bilateral common iliac arteries. The patient denies any claudication like symptoms, rest pain or ulcers to her feet /  toes.   1. Left Groin Pain - Resolved Pain occurred once and has resolved.  Most likely musculoskeletal in nature.  2. PAD (peripheral artery disease) ([place]) - Stable Patient with normal ABI and duplex. Patient is asymptomatic. No intervention indicated at this time.  Patient to remain abstinent of tobacco use. I have discussed with the patient at length the risk factors for and pathogenesis of atherosclerotic disease and encouraged a healthy diet, regular exercise regimen and blood pressure / glucose control.  The patient was encouraged to call the office in the interim if she experiences any claudication like symptoms, rest pain or ulcers to her feet / toes.  - VAS [place] ABI WITH/WO TBI; Future - VAS US AORTA/IVC/ILIACS; Future  3. Hyperlipidemia, unspecified hyperlipidemia type - Stable Encouraged good control as its slows the progression of atherosclerotic disease  Current Outpatient Prescriptions on File Prior to Visit  Medication Sig Dispense Refill  . aspirin EC 81 MG EC tablet Take 1 tablet (81 mg total) by mouth daily. 30 tablet 1  . atorvastatin (LIPITOR) 40 MG tablet take 1 tablet by mouth once daily AT 6PM 30 tablet 3  . escitalopram (LEXAPRO) 10 MG tablet take 1 tablet by mouth once daily 30 tablet 0  . esomeprazole (NEXIUM) 40 MG capsule Take 40 mg by mouth daily at 12 noon. Reported on 06/09/2015    . ibuprofen  (ADVIL,MOTRIN) 600 MG tablet Take 600 mg by mouth every 6 (six) hours as needed for moderate pain.     . metoprolol tartrate (LOPRESSOR) 25 MG tablet Take 25 mg by mouth 2 (two) times daily. Reported on 06/09/2015    . montelukast (SINGULAIR) 10 MG tablet Take 1 tablet (10 mg total) by mouth at bedtime. (Patient taking differently: Take 10 mg by mouth daily as needed (allergies). ) 30 tablet 3  . pantoprazole (PROTONIX) 40 MG tablet Take 40 mg by mouth daily.    Marland Kitchen PROAIR HFA 108 (90 Base) MCG/ACT inhaler inhale 2 puffs by mouth every 6 hours if needed for wheezing  1  . rivaroxaban (XARELTO) 20 MG TABS tablet Take 20 mg by mouth daily with supper.    Marland Kitchen azithromycin (ZITHROMAX) 250 MG tablet Take 2 tablets PO on day one, and one tablet PO daily thereafter until completed. (Patient not taking: Reported on 01/14/2016) 6 tablet 0  . cetirizine (ZYRTEC) 10 MG tablet Take 10 mg by mouth daily as needed for allergies.     Marland Kitchen clopidogrel (PLAVIX) 75 MG tablet Take 1 tablet (75 mg total) by mouth daily. (Patient not taking: Reported on 01/14/2016) 30 tablet 11   No current facility-administered medications on file prior to visit.     There are no Patient Instructions on file for this visit. No Follow-up on file.   Chellsie Gomer A Kashmir [place]ht, PA-C

## 2016-02-13 ENCOUNTER — Other Ambulatory Visit: Payer: Self-pay | Admitting: Physician Assistant

## 2016-02-13 DIAGNOSIS — F419 Anxiety disorder, unspecified: Secondary | ICD-10-CM

## 2016-02-13 NOTE — Telephone Encounter (Signed)
Needs office visit, will refill Lexapro.

## 2016-02-19 ENCOUNTER — Encounter: Payer: Self-pay | Admitting: Physician Assistant

## 2016-02-19 ENCOUNTER — Ambulatory Visit (INDEPENDENT_AMBULATORY_CARE_PROVIDER_SITE_OTHER): Payer: Medicare Other | Admitting: Physician Assistant

## 2016-02-19 VITALS — BP 140/90 | HR 72 | Temp 98.2°F | Resp 16 | Wt 215.0 lb

## 2016-02-19 DIAGNOSIS — Z8679 Personal history of other diseases of the circulatory system: Secondary | ICD-10-CM

## 2016-02-19 DIAGNOSIS — I739 Peripheral vascular disease, unspecified: Secondary | ICD-10-CM

## 2016-02-19 DIAGNOSIS — I482 Chronic atrial fibrillation, unspecified: Secondary | ICD-10-CM

## 2016-02-19 DIAGNOSIS — R600 Localized edema: Secondary | ICD-10-CM | POA: Diagnosis not present

## 2016-02-19 DIAGNOSIS — I70213 Atherosclerosis of native arteries of extremities with intermittent claudication, bilateral legs: Secondary | ICD-10-CM | POA: Diagnosis not present

## 2016-02-19 MED ORDER — FUROSEMIDE 20 MG PO TABS: 20.0000 mg | ORAL_TABLET | Freq: Every day | ORAL | 3 refills | Status: DC

## 2016-02-19 MED ORDER — POTASSIUM CHLORIDE ER 10 MEQ PO TBCR: 10.0000 meq | EXTENDED_RELEASE_TABLET | Freq: Every day | ORAL | 3 refills | Status: DC

## 2016-02-19 NOTE — Patient Instructions (Signed)
Edema Edema is an abnormal buildup of fluids in your bodytissues. Edema is somewhatdependent on gravity to pull the fluid to the lowest place in your body. That makes the condition more common in the legs and thighs (lower extremities). Painless swelling of the feet and ankles is common and becomes more likely as you get older. It is also common in looser tissues, like around your eyes. When the affected area is squeezed, the fluid may move out of that spot and leave a dent for a few moments. This dent is called pitting. What are the causes? There are many possible causes of edema. Eating too much salt and being on your feet or sitting for a long time can cause edema in your legs and ankles. Hot weather may make edema worse. Common medical causes of edema include:  Heart failure.  Liver disease.  Kidney disease.  Weak blood vessels in your legs.  Cancer.  An injury.  Pregnancy.  Some medications.  Obesity.  What are the signs or symptoms? Edema is usually painless.Your skin may look swollen or shiny. How is this diagnosed? Your health care provider may be able to diagnose edema by asking about your medical history and doing a physical exam. You may need to have tests such as X-rays, an electrocardiogram, or blood tests to check for medical conditions that may cause edema. How is this treated? Edema treatment depends on the cause. If you have heart, liver, or kidney disease, you need the treatment appropriate for these conditions. General treatment may include:  Elevation of the affected body part above the level of your heart.  Compression of the affected body part. Pressure from elastic bandages or support stockings squeezes the tissues and forces fluid back into the blood vessels. This keeps fluid from entering the tissues.  Restriction of fluid and salt intake.  Use of a water pill (diuretic). These medications are appropriate only for some types of edema. They pull fluid  out of your body and make you urinate more often. This gets rid of fluid and reduces swelling, but diuretics can have side effects. Only use diuretics as directed by your health care provider.  Follow these instructions at home:  Keep the affected body part above the level of your heart when you are lying down.  Do not sit [place] or stand for prolonged periods.  Do not put anything directly under your knees when lying down.  Do not wear constricting clothing or garters on your upper legs.  Exercise your legs to work the fluid back into your blood vessels. This may help the swelling go down.  Wear elastic bandages or support stockings to reduce ankle swelling as directed by your health care provider.  Eat a low-salt diet to reduce fluid if your health care provider recommends it.  Only take medicines as directed by your health care provider. Contact a health care provider if:  Your edema is not responding to treatment.  You have heart, liver, or kidney disease and notice symptoms of edema.  You have edema in your legs that does not improve after elevating them.  You have sudden and unexplained weight gain. Get help right away if:  You develop shortness of breath or chest pain.  You cannot breathe when you lie down.  You develop pain, redness, or warmth in the swollen areas.  You have heart, liver, or kidney disease and suddenly get edema.  You have a fever and your symptoms suddenly get worse. This information is   not intended to replace advice given to you by your health care provider. Make sure you discuss any questions you have with your health care provider. Document Released: 12/21/2004 Document Revised: 05/29/2015 Document Reviewed: 10/13/2012 Elsevier Interactive Patient Education  2017 [employer]  

## 2016-02-19 NOTE — Progress Notes (Signed)
Patient: Kayla Maxwell Female    DOB: [birth year]   71 y.o.   MRN: [id number] Visit Date: 02/19/2016  Today's Provider: Mar Daring, PA-C   Chief Complaint  Patient presents with  . Edema   Subjective:    HPI Edema: Patient complains of edema. The location of the edema is ankle(s) bilateral, feet bilateral,  toe(s) left.  The edema has been off and on.  Onset of symptoms was several months ago, gradually worsening since that time. The edema is present all day. The patient states the problem is long-standing.  The swelling has been aggravated by nothing, relieved by elevation of involved area, and been associated with diagnosis of heart failure and shortness of breath, PAD. Patient states SOB is at baseline and she has not noticed any worsening symptoms, no weight changes, no worsening DOE compared to baseline. Denies orthopnea or PND, stating "I sleep with a CPAP machine." No claudication, no rest pain. Cardiac risk factors include advanced age (older than 47 for men, 14 for women), obesity (BMI >= 30 kg/m2) and smoking/ tobacco exposure. She is followed by Dr. Cammie Sickle (cardiology) and Dr. Delana Meyer (vascular). She has a totally occluded left internal carotid artery and chronic a. fib. She also recently underwent iliofemoral endograft stent placement on 08/12/15. Vas [place] and ABIs were recently rechecked on 01/14/16 and ABI was normal (1.08 L, 1.04 R), palpable pulses. She continues to use xarelto and ASA 81mg .    Allergies  Allergen Reactions  . Codeine Other (See Comments)    Makes her feel "crazy"  . Penicillins Swelling    Has patient had a PCN reaction causing immediate rash, facial/tongue/throat swelling, SOB or lightheadedness with hypotension: unknown Has patient had a PCN reaction causing severe rash involving mucus membranes or skin necrosis: unknown Has patient had a PCN reaction that required hospitalization unknown Has patient had a PCN reaction occurring within the  last 10 years: no If all of the above answers are "NO", then may proceed with Cephalosporin use.  . Adhesive [Tape] Hives  . Sulfa Antibiotics Nausea Only and Rash     Current Outpatient Prescriptions:  .  aspirin EC 81 MG EC tablet, Take 1 tablet (81 mg total) by mouth daily., Disp: 30 tablet, Rfl: 1 .  atorvastatin (LIPITOR) 40 MG tablet, take 1 tablet by mouth once daily AT 6PM, Disp: 30 tablet, Rfl: 3 .  cetirizine (ZYRTEC) 10 MG tablet, Take 10 mg by mouth daily as needed for allergies. , Disp: , Rfl:  .  escitalopram (LEXAPRO) 10 MG tablet, take 1 tablet by mouth once daily, Disp: 30 tablet, Rfl: 0 .  esomeprazole (NEXIUM) 40 MG capsule, Take 40 mg by mouth daily at 12 noon. Reported on 06/09/2015, Disp: , Rfl:  .  ibuprofen (ADVIL,MOTRIN) 600 MG tablet, Take 600 mg by mouth every 6 (six) hours as needed for moderate pain. , Disp: , Rfl:  .  metoprolol tartrate (LOPRESSOR) 25 MG tablet, Take 25 mg by mouth 2 (two) times daily. Reported on 06/09/2015, Disp: , Rfl:  .  montelukast (SINGULAIR) 10 MG tablet, Take 1 tablet (10 mg total) by mouth at bedtime. (Patient taking differently: Take 10 mg by mouth daily as needed (allergies). ), Disp: 30 tablet, Rfl: 3 .  pantoprazole (PROTONIX) 40 MG tablet, Take 40 mg by mouth daily., Disp: , Rfl:  .  PROAIR HFA 108 (90 Base) MCG/ACT inhaler, inhale 2 puffs by mouth every 6 hours if needed  for wheezing, Disp: , Rfl: 1 .  rivaroxaban (XARELTO) 20 MG TABS tablet, Take 20 mg by mouth daily with supper., Disp: , Rfl:   Review of Systems  Constitutional: Negative.   HENT: Negative.   Respiratory: Positive for cough and shortness of breath. Negative for chest tightness and wheezing.   Cardiovascular: Positive for leg swelling (left foot). Negative for chest pain and palpitations.  Gastrointestinal: Negative.   Endocrine: Negative.   Genitourinary: Negative.   Musculoskeletal: Negative.   Neurological: Negative.   Hematological: Bruises/bleeds easily.      Social History  Substance Use Topics  . Smoking status: Former Smoker    Packs/day: 1.00    Years: 45.00    Quit date: 08/19/2014  . Smokeless tobacco: Former Systems developer    Quit date: 08/19/2014  . Alcohol use No     Comment: Rarely   Objective:   BP 140/90 (BP Location: Left Arm, Patient Position: Sitting, Cuff Size: Large)   Pulse 72   Temp 98.2 F (36.8 C)   Resp 16   Wt 215 lb (97.5 kg)   SpO2 97%   BMI 35.78 kg/m   Physical Exam  Constitutional: She appears well-developed and well-nourished. No distress.  Neck: Normal range of motion. Neck supple. No JVD present. No tracheal deviation present. No thyromegaly present.  Cardiovascular: Normal rate, normal heart sounds and intact distal pulses.  An irregularly irregular rhythm present. Exam reveals no gallop and no friction rub.   No murmur heard. Pulmonary/Chest: Effort normal and breath sounds normal. No respiratory distress. She has no wheezes. She has no rales.  Musculoskeletal: She exhibits edema (1-2+ left foot; left cool to touch but palpable DP pulse, cap refill 3-4 seconds, left foot had purplish-red discoloration; varicosities and reticular veins noted).  Lymphadenopathy:    She has no cervical adenopathy.  Skin: She is not diaphoretic.  Vitals reviewed.     Assessment & Plan:      1. Localized edema Will check labs as below to R/O other causes such as kidney issue, anemia or CHF. Suspect venous insufficiency vs revascularization edema that has never fully went away. Will start lasix and potassium as below. Elevate legs, compression stockings ok. If labs are unremarkable will continue current treatment plan. If BNP is elevated may consider adding ACE or ARB. - CBC w/Diff/Platelet - Comprehensive Metabolic Panel (CMET) - B Nat Peptide - furosemide (LASIX) 20 MG tablet; Take 1 tablet (20 mg total) by mouth daily.  Dispense: 30 tablet; Refill: 3 - potassium chloride (K-DUR) 10 MEQ tablet; Take 1 tablet (10 mEq  total) by mouth daily.  Dispense: 30 tablet; Refill: 3  2. H/O CHF See above medical treatment plan. - B Nat Peptide - furosemide (LASIX) 20 MG tablet; Take 1 tablet (20 mg total) by mouth daily.  Dispense: 30 tablet; Refill: 3 - potassium chloride (K-DUR) 10 MEQ tablet; Take 1 tablet (10 mEq total) by mouth daily.  Dispense: 30 tablet; Refill: 3  3. PAD (peripheral artery disease) (HCC) Essentially stable. Was seen in 01/14/16 with normal ABI and velocities. Continue ASA. She is to call if she starts developing claudication or rest pain.   4. Chronic atrial fibrillation (HCC) Continue metoprolol and xarelto.       Mar Daring, PA-C  [facility]

## 2016-02-20 ENCOUNTER — Telehealth: Payer: Self-pay

## 2016-02-20 LAB — COMPREHENSIVE METABOLIC PANEL
ALT: 15 IU/L (ref 0–32)
AST: 13 IU/L (ref 0–40)
Albumin/Globulin Ratio: 1.7 (ref 1.2–2.2)
Albumin: 3.9 g/dL (ref 3.5–4.8)
Alkaline Phosphatase: 94 IU/L (ref 39–117)
BUN/Creatinine Ratio: 17 (ref 12–28)
BUN: 18 mg/dL (ref 8–27)
Bilirubin Total: 0.5 mg/dL (ref 0.0–1.2)
CO2: 24 mmol/L (ref 18–29)
Calcium: 9.3 mg/dL (ref 8.7–10.3)
Chloride: 102 mmol/L (ref 96–106)
Creatinine, Ser: 1.06 mg/dL — ABNORMAL HIGH (ref 0.57–1.00)
GFR calc Af Amer: 61 mL/min/{1.73_m2} (ref >59)
GFR calc non Af Amer: 53 mL/min/{1.73_m2} — ABNORMAL LOW (ref >59)
Globulin, Total: 2.3 g/dL (ref 1.5–4.5)
Glucose: 98 mg/dL (ref 65–99)
Potassium: 5.1 mmol/L (ref 3.5–5.2)
Sodium: 141 mmol/L (ref 134–144)
Total Protein: 6.2 g/dL (ref 6.0–8.5)

## 2016-02-20 LAB — CBC WITH DIFFERENTIAL/PLATELET
Basophils Absolute: 0.1 10*3/uL (ref 0.0–0.2)
Basos: 1 %
EOS (ABSOLUTE): 0.2 10*3/uL (ref 0.0–0.4)
Eos: 2 %
Hematocrit: 44.7 % (ref 34.0–46.6)
Hemoglobin: 14.6 g/dL (ref 11.1–15.9)
Immature Grans (Abs): 0 10*3/uL (ref 0.0–0.1)
Immature Granulocytes: 0 %
Lymphocytes Absolute: 3.2 10*3/uL — ABNORMAL HIGH (ref 0.7–3.1)
Lymphs: 32 %
MCH: 28.3 pg (ref 26.6–33.0)
MCHC: 32.7 g/dL (ref 31.5–35.7)
MCV: 87 fL (ref 79–97)
Monocytes Absolute: 0.8 10*3/uL (ref 0.1–0.9)
Monocytes: 8 %
Neutrophils Absolute: 5.7 10*3/uL (ref 1.4–7.0)
Neutrophils: 57 %
Platelets: 286 10*3/uL (ref 150–379)
RBC: 5.16 x10E6/uL (ref 3.77–5.28)
RDW: 13.5 % (ref 12.3–15.4)
WBC: 10.1 10*3/uL (ref 3.4–10.8)

## 2016-02-20 LAB — BRAIN NATRIURETIC PEPTIDE: BNP: 274.4 pg/mL — ABNORMAL HIGH (ref 0.0–100.0)

## 2016-02-20 MED ORDER — LISINOPRIL 2.5 MG PO TABS: 2.5000 mg | ORAL_TABLET | Freq: Every day | ORAL | 3 refills | Status: DC

## 2016-02-20 NOTE — Telephone Encounter (Signed)
Patient has been advised. KW 

## 2016-02-20 NOTE — Addendum Note (Signed)
Addended by: Mar Daring on: 02/20/2016 02:17 PM   Modules accepted: Orders

## 2016-02-20 NOTE — Telephone Encounter (Signed)
-----   Message from Mar Daring, [place] sent at 02/20/2016  2:16 PM EST ----- Labs are stable but BNP is slightly elevated. This could be indicative of the heart failure flaring up again. We normally treat this with the fluid pill like we started already. I do think we should add a low dose blood pressure medication for the kidneys like we discussed in the office. I will send this in for you. Lets touch base again in 4-6 weeks to see how you are doing.

## 2016-02-26 ENCOUNTER — Other Ambulatory Visit: Payer: Self-pay | Admitting: Physician Assistant

## 2016-02-26 DIAGNOSIS — J0140 Acute pansinusitis, unspecified: Secondary | ICD-10-CM

## 2016-02-26 IMAGING — CT CT ABD-PELV W/O CM
2 of 4 series · 16 of 46 positions shown, 18 images · non-contrast
Comparison: None.

CLINICAL DATA: Postop abdominal distention and nausea and vomiting.
Approximately 1 week status post lumbar spine surgery.

EXAM:
CT ABDOMEN AND PELVIS WITHOUT CONTRAST
TECHNIQUE: Multidetector CT imaging of the abdomen and pelvis was performed
following the standard protocol without IV contrast.

[Series 2: abd/ pelvis 5.0 i30f 1 · axial · 0.97mm/px · z∈[-1161,-706]mm · 13 of 99 slices shown, 15 images]
[im 4/99  soft-tissue]
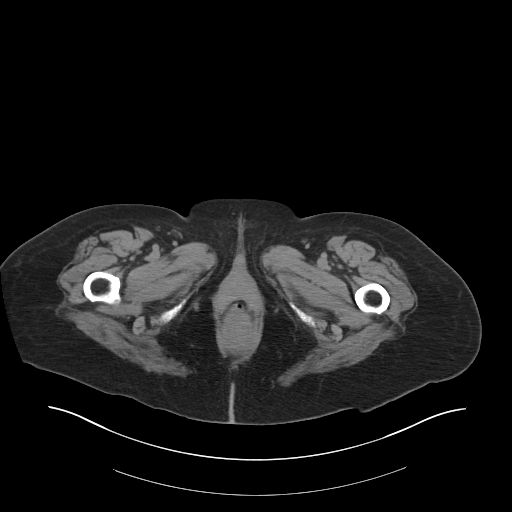
[im 4/99  bone]
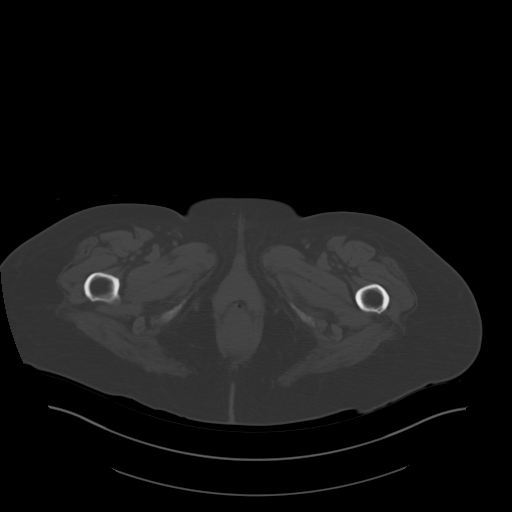
[im 12/99  soft-tissue]
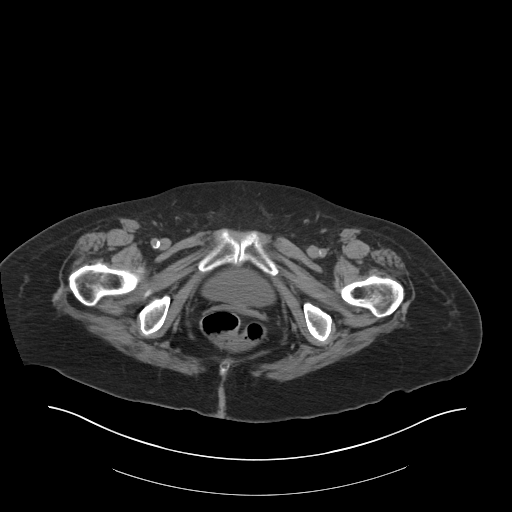
[im 19/99  soft-tissue]
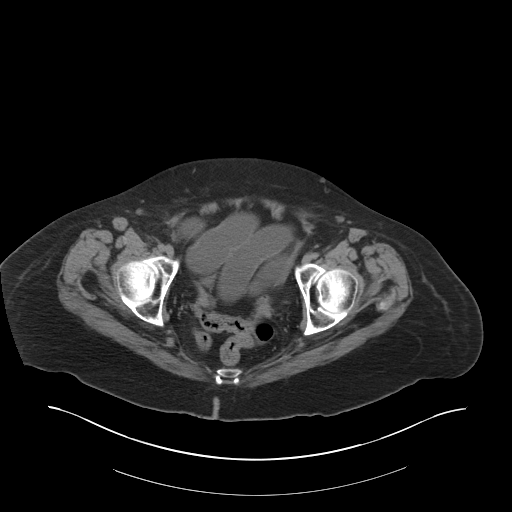
[im 27/99  soft-tissue]
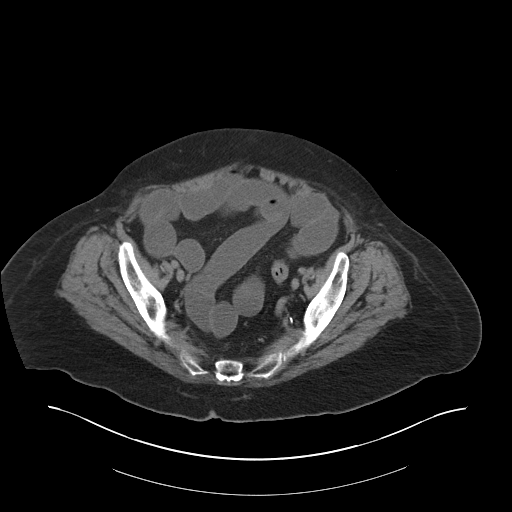
[im 34/99  soft-tissue]
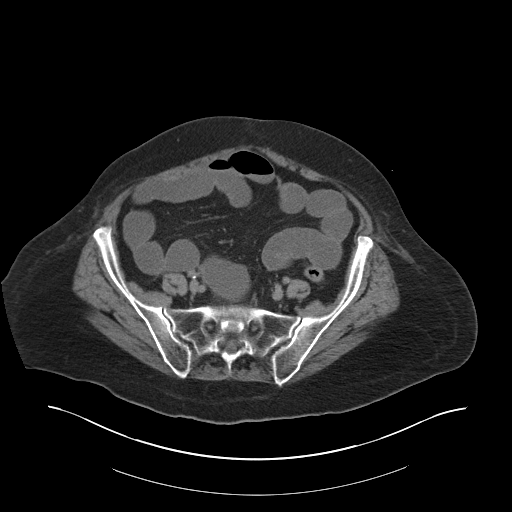
[im 42/99  soft-tissue]
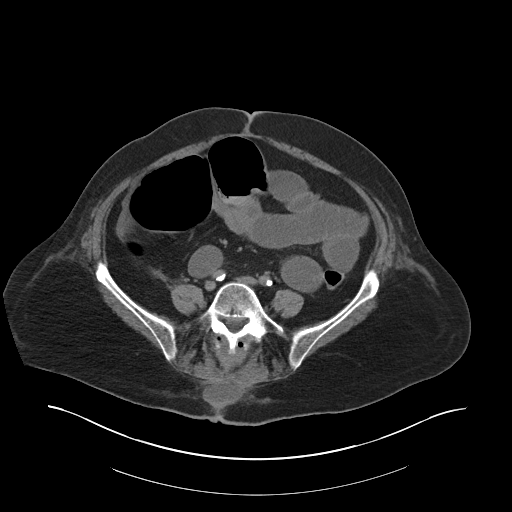
[im 50/99  soft-tissue]
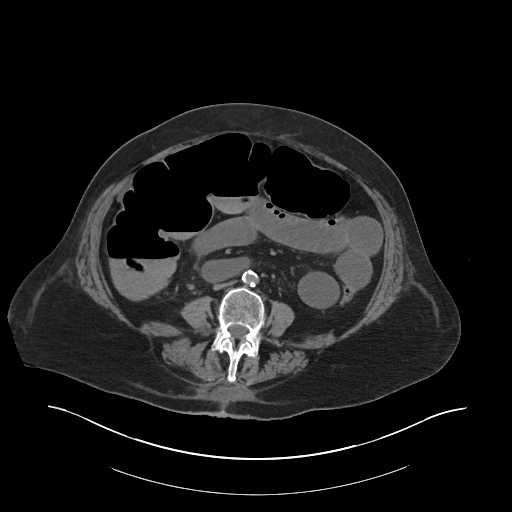
[im 57/99  soft-tissue]
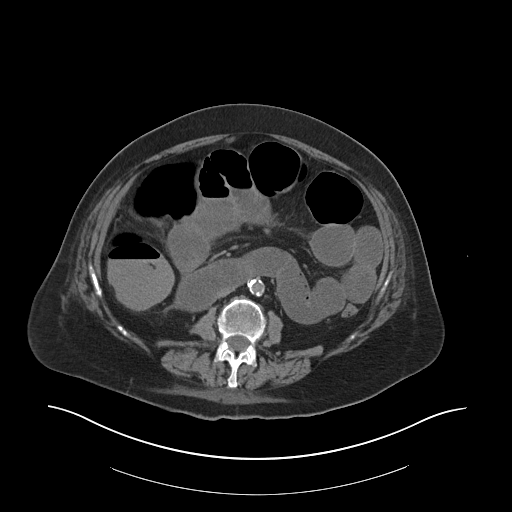
[im 65/99  soft-tissue]
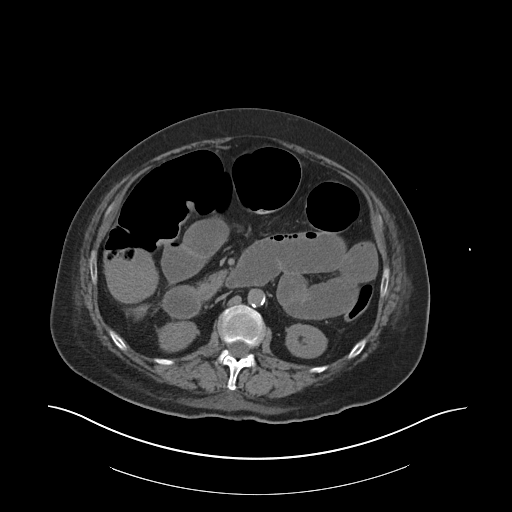
[im 65/99  bone]
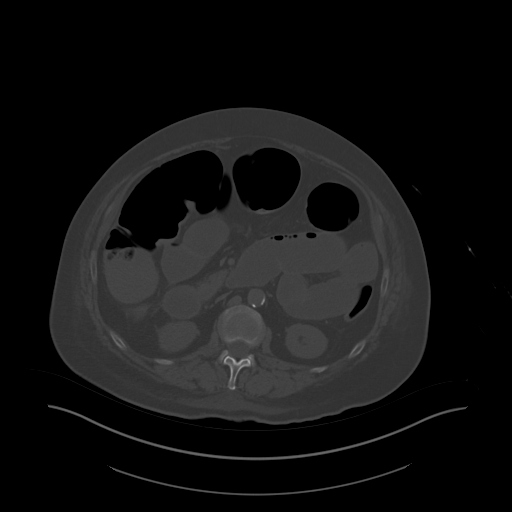
[im 72/99  soft-tissue]
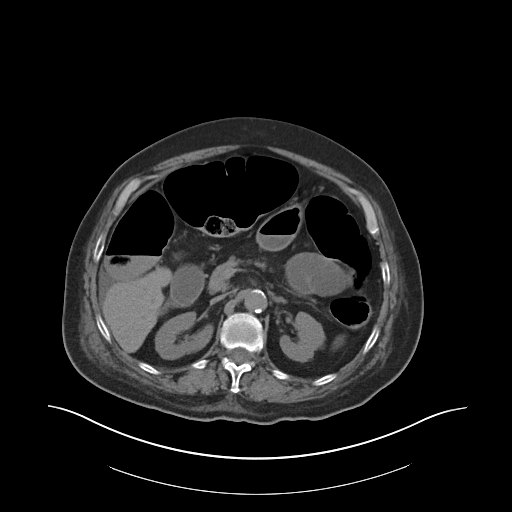
[im 80/99  soft-tissue]
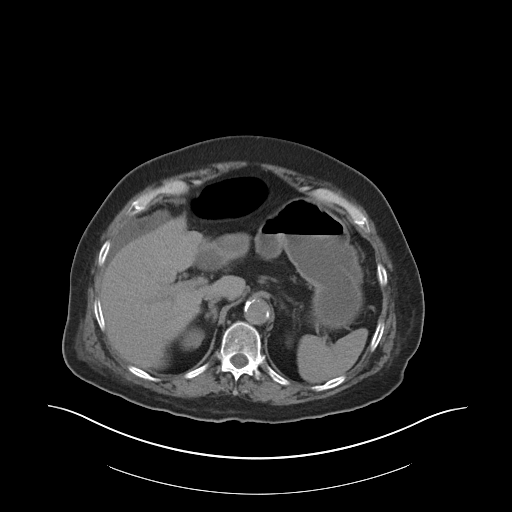
[im 87/99  soft-tissue]
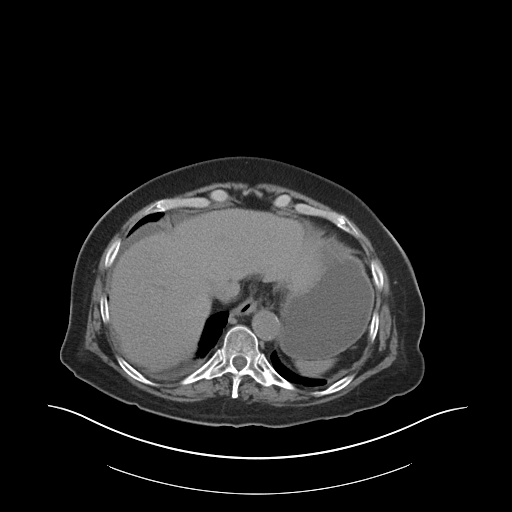
[im 95/99  soft-tissue]
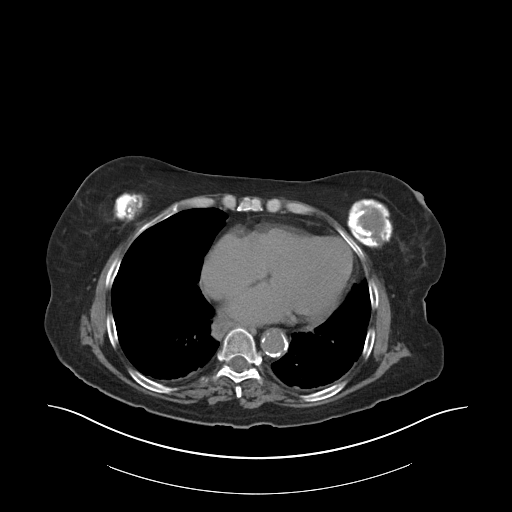

[Series 5: coronals · coronal · 0.81mm/px · 3 of 146 slices shown]
[im 49/146  soft-tissue]
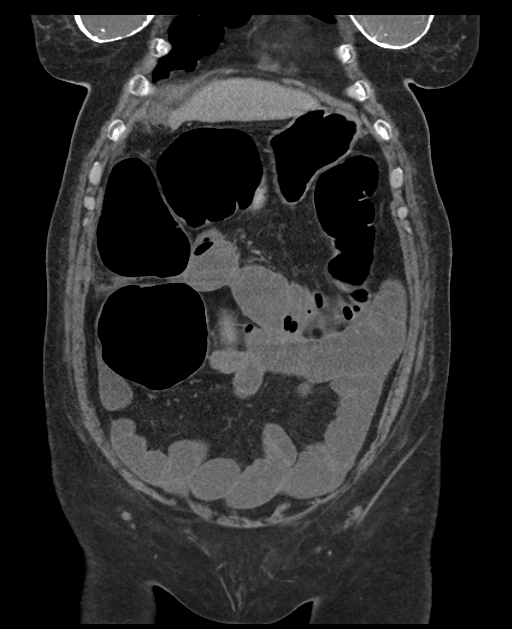
[im 65/146  soft-tissue]
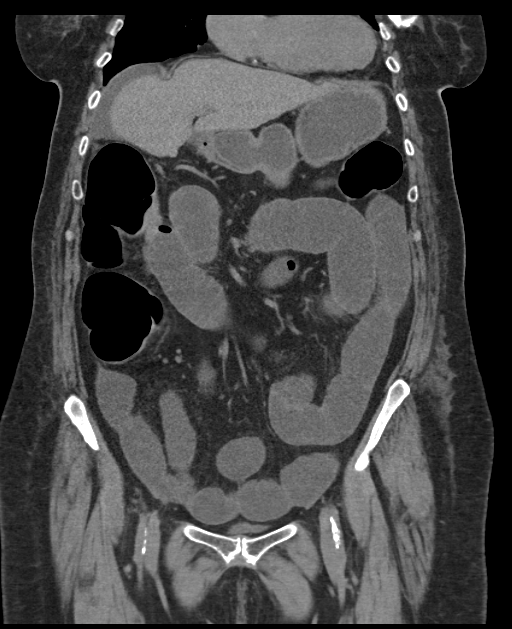
[im 81/146  soft-tissue]
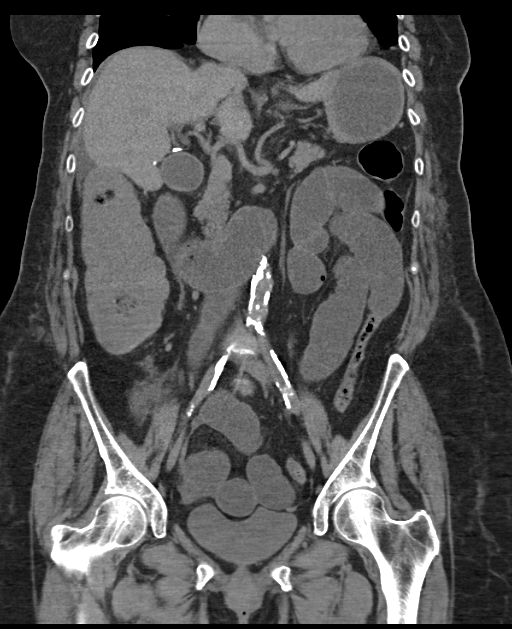

[16 of 46 positions shown; findings below may reference images not displayed]

FINDINGS: Lower chest: No acute findings.

Hepatobiliary: No mass visualized on this unenhanced exam. Prior
cholecystectomy noted. No evidence of biliary dilatation.

Pancreas: No mass or inflammatory process visualized on this
unenhanced exam.

Spleen:  Within normal limits in size.

Adrenal Glands:  No masses identified.

Kidneys/Urinary tract: No evidence of urolithiasis or
hydronephrosis. Small fluid attenuation cyst seen in upper pole of
right kidney.

Stomach/Bowel/Peritoneum: Fluid-filled loops of small bowel and
colon are seen which are moderately dilated. No transition point
visualized. This is most consistent with a postop ileus. Mild
perihepatic ascites noted. No focal inflammatory process or
intraperitoneal abscess visualized.

Vascular/Lymphatic: No pathologically enlarged lymph nodes
identified. No abdominal aortic aneurysm or other significant
retroperitoneal abnormality demonstrated.

Reproductive: Prior hysterectomy noted. Adnexal regions are
unremarkable in appearance.

Other:  None.

Musculoskeletal: No suspicious bone lesions identified. Small amount
of postop fluid and gas seen within posterior paraspinal soft
tissues at site of L4 laminectomy.
IMPRESSION: Moderate to severe postop ileus pattern. Minimal ascites. No focal
mass or inflammatory process identified within the abdomen or
pelvis.

Small amount of postop fluid and soft tissue gas noted at L4
laminectomy site.

## 2016-03-12 DIAGNOSIS — G252 Other specified forms of tremor: Secondary | ICD-10-CM | POA: Diagnosis not present

## 2016-03-12 DIAGNOSIS — I6523 Occlusion and stenosis of bilateral carotid arteries: Secondary | ICD-10-CM | POA: Insufficient documentation

## 2016-03-12 DIAGNOSIS — I482 Chronic atrial fibrillation: Secondary | ICD-10-CM | POA: Diagnosis not present

## 2016-03-12 DIAGNOSIS — I70219 Atherosclerosis of native arteries of extremities with intermittent claudication, unspecified extremity: Secondary | ICD-10-CM | POA: Diagnosis not present

## 2016-03-12 DIAGNOSIS — I63232 Cerebral infarction due to unspecified occlusion or stenosis of left carotid arteries: Secondary | ICD-10-CM | POA: Diagnosis not present

## 2016-03-15 ENCOUNTER — Encounter (INDEPENDENT_AMBULATORY_CARE_PROVIDER_SITE_OTHER): Payer: Medicare Other

## 2016-03-15 ENCOUNTER — Ambulatory Visit (INDEPENDENT_AMBULATORY_CARE_PROVIDER_SITE_OTHER): Payer: Medicare Other | Admitting: Vascular Surgery

## 2016-04-12 DIAGNOSIS — Z1231 Encounter for screening mammogram for malignant neoplasm of breast: Secondary | ICD-10-CM | POA: Diagnosis not present

## 2016-04-21 ENCOUNTER — Encounter: Payer: Self-pay | Admitting: Physician Assistant

## 2016-05-08 ENCOUNTER — Other Ambulatory Visit: Payer: Self-pay | Admitting: Family Medicine

## 2016-05-08 DIAGNOSIS — I63232 Cerebral infarction due to unspecified occlusion or stenosis of left carotid arteries: Secondary | ICD-10-CM

## 2016-06-07 ENCOUNTER — Ambulatory Visit (INDEPENDENT_AMBULATORY_CARE_PROVIDER_SITE_OTHER): Payer: Medicare Other | Admitting: Physician Assistant

## 2016-06-07 ENCOUNTER — Encounter: Payer: Self-pay | Admitting: Physician Assistant

## 2016-06-07 VITALS — BP 116/76 | HR 92 | Temp 97.8°F | Resp 16 | Wt 223.8 lb

## 2016-06-07 DIAGNOSIS — J4540 Moderate persistent asthma, uncomplicated: Secondary | ICD-10-CM

## 2016-06-07 DIAGNOSIS — R600 Localized edema: Secondary | ICD-10-CM | POA: Diagnosis not present

## 2016-06-07 DIAGNOSIS — I70213 Atherosclerosis of native arteries of extremities with intermittent claudication, bilateral legs: Secondary | ICD-10-CM

## 2016-06-07 MED ORDER — FUROSEMIDE 40 MG PO TABS: 40.0000 mg | ORAL_TABLET | Freq: Every day | ORAL | 3 refills | Status: DC

## 2016-06-07 MED ORDER — PROAIR HFA 108 (90 BASE) MCG/ACT IN AERS: INHALATION_SPRAY | RESPIRATORY_TRACT | 5 refills | Status: DC

## 2016-06-07 MED ORDER — POTASSIUM CHLORIDE ER 20 MEQ PO TBCR: 1.0000 | EXTENDED_RELEASE_TABLET | Freq: Every day | ORAL | 3 refills | Status: DC

## 2016-06-07 NOTE — Progress Notes (Signed)
Patient: Kayla Maxwell Female    DOB: [birth year]   71 y.o.   MRN: [id number] Visit Date: 06/07/2016  Today's Provider: Mar Daring, PA-C   Chief Complaint  Patient presents with  . Foot Swelling   Subjective:    HPI  Patient is here with her daughter (Kayla Maxwell,  Aka Kayla Maxwell.) Patient here today C/O swelling around her feet times several months. Patient reports that swelling has been worsening in the last few weeks. Patient reports that symptoms are present all day and night. Patient reports very mild relief with compression stocking and elevation. Patient reports that she is taking furosemide daily.   Patient reports that she has been using her albuterol inhaler at least 4 times daily. Patient reports she gets cough attacks and reports good symptom control with albuterol. She declines wanting to add a daily inhaler for better symptom control at this time. She reports that she does not have to use her albuterol daily, but on the days she does she has to use it throughout the day.     Allergies  Allergen Reactions  . Codeine Other (See Comments)    Makes her feel "crazy"  . Penicillins Swelling    Has patient had a PCN reaction causing immediate rash, facial/tongue/throat swelling, SOB or lightheadedness with hypotension: unknown Has patient had a PCN reaction causing severe rash involving mucus membranes or skin necrosis: unknown Has patient had a PCN reaction that required hospitalization unknown Has patient had a PCN reaction occurring within the last 10 years: no If all of the above answers are "NO", then may proceed with Cephalosporin use.  . Adhesive [Tape] Hives  . Sulfa Antibiotics Nausea Only and Rash     Current Outpatient Prescriptions:  .  aspirin EC 81 MG EC tablet, Take 1 tablet (81 mg total) by mouth daily., Disp: 30 tablet, Rfl: 1 .  atorvastatin (LIPITOR) 40 MG tablet, take 1 tablet by mouth once daily AT 6PM, Disp: 30 tablet, Rfl: 3 .  cetirizine  (ZYRTEC) 10 MG tablet, Take 10 mg by mouth daily as needed for allergies. , Disp: , Rfl:  .  esomeprazole (NEXIUM) 40 MG capsule, Take 40 mg by mouth daily at 12 noon. Reported on 06/09/2015, Disp: , Rfl:  .  furosemide (LASIX) 20 MG tablet, Take 1 tablet (20 mg total) by mouth daily., Disp: 30 tablet, Rfl: 3 .  ibuprofen (ADVIL,MOTRIN) 600 MG tablet, Take 600 mg by mouth every 6 (six) hours as needed for moderate pain. , Disp: , Rfl:  .  lisinopril (PRINIVIL,ZESTRIL) 2.5 MG tablet, Take 1 tablet (2.5 mg total) by mouth daily., Disp: 30 tablet, Rfl: 3 .  metoprolol tartrate (LOPRESSOR) 25 MG tablet, Take 25 mg by mouth 2 (two) times daily. Reported on 06/09/2015, Disp: , Rfl:  .  montelukast (SINGULAIR) 10 MG tablet, Take 1 tablet (10 mg total) by mouth at bedtime. (Patient taking differently: Take 10 mg by mouth daily as needed (allergies). ), Disp: 30 tablet, Rfl: 3 .  potassium chloride (K-DUR) 10 MEQ tablet, Take 1 tablet (10 mEq total) by mouth daily., Disp: 30 tablet, Rfl: 3 .  PROAIR HFA 108 (90 Base) MCG/ACT inhaler, inhale 2 puffs by mouth every 6 hours if needed for wheezing, Disp: , Rfl: 1 .  rivaroxaban (XARELTO) 20 MG TABS tablet, Take 20 mg by mouth daily with supper., Disp: , Rfl:   Review of Systems  Constitutional: Negative.   HENT:  Face and nose itching  Respiratory: Positive for cough. Negative for chest tightness, shortness of breath and wheezing.   Cardiovascular: Positive for leg swelling. Negative for chest pain and palpitations.  Gastrointestinal: Negative for abdominal pain.  Neurological: Negative for dizziness, weakness, numbness and headaches.    Social History  Substance Use Topics  . Smoking status: Former Smoker    Packs/day: 1.00    Years: 45.00    Quit date: 08/19/2014  . Smokeless tobacco: Former Systems developer    Quit date: 08/19/2014  . Alcohol use No     Comment: Rarely   Objective:   BP 116/76 (BP Location: Left Arm, Patient Position: Sitting, Cuff Size:  Large)   Pulse 92   Temp 97.8 F (36.6 C) (Oral)   Resp 16   Wt 223 lb 12.8 oz (101.5 kg)   SpO2 97%   BMI 37.24 kg/m  Vitals:   06/07/16 1556  BP: 116/76  Pulse: 92  Resp: 16  Temp: 97.8 F (36.6 C)  TempSrc: Oral  SpO2: 97%  Weight: 223 lb 12.8 oz (101.5 kg)     Physical Exam  Constitutional: She appears well-developed and well-nourished. No distress.  Neck: Normal range of motion. Neck supple. No JVD present. No tracheal deviation present. No thyromegaly present.  Cardiovascular: Normal rate, regular rhythm and normal heart sounds.  Exam reveals no gallop and no friction rub.   No murmur heard. Pulmonary/Chest: Effort normal and breath sounds normal. No respiratory distress. She has no wheezes. She has no rales.  Musculoskeletal: She exhibits edema (1+ pitting edema from feet to just below the knee; purplish discoloration to her feet).  Lymphadenopathy:    She has no cervical adenopathy.  Skin: She is not diaphoretic.  Vitals reviewed.       Assessment & Plan:     1. Localized edema Previous BNP was borderline high and patient does have CVD and atrial fibrillation, on xarelto and ASA. Suspect venous insufficiency as a secondary cause to her edema. Will increase furosemide and potassium as below. Will check labs as below as well and f/u pending results. Advised to also wear compression stockings and elevate legs when she has the chance. I will see her back in 3 months to re-evaluate.  - furosemide (LASIX) 40 MG tablet; Take 1 tablet (40 mg total) by mouth daily.  Dispense: 30 tablet; Refill: 3 - Potassium Chloride ER 20 MEQ TBCR; Take 1 tablet by mouth daily.  Dispense: 30 tablet; Refill: 3 - CBC w/Diff/Platelet - Comprehensive Metabolic Panel (CMET) - B Nat Peptide  2. Moderate persistent asthma without complication Stable. Diagnosis pulled for medication refill. Continue current medical treatment plan. - PROAIR HFA 108 (90 Base) MCG/ACT inhaler; inhale 2 puffs by  mouth every 6 hours if needed for wheezing  Dispense: 18 g; Refill: [place], PA-C  [facility]

## 2016-06-07 NOTE — Patient Instructions (Signed)
Add CoQ10 to see if this helps muscle cramping. If not discontinue and add magnesium.  Edema Edema is when you have too much fluid in your body or under your skin. Edema may make your legs, feet, and ankles swell up. Swelling is also common in looser tissues, like around your eyes. This is a common condition. It gets more common as you get older. There are many possible causes of edema. Eating too much salt (sodium) and being on your feet or sitting for a long time can cause edema in your legs, feet, and ankles. Hot weather may make edema worse. Edema is usually painless. Your skin may look swollen or shiny. Follow these instructions at home:  Keep the swollen body part raised (elevated) above the level of your heart when you are sitting or lying down.  Do not sit [place] or stand for a long time.  Do not wear tight clothes. Do not wear garters on your upper legs.  Exercise your legs. This can help the swelling go down.  Wear elastic bandages or support stockings as told by your doctor.  Eat a low-salt (low-sodium) diet to reduce fluid as told by your doctor.  Depending on the cause of your swelling, you may need to limit how much fluid you drink (fluid restriction).  Take over-the-counter and prescription medicines only as told by your doctor. Contact a doctor if:  Treatment is not working.  You have heart, liver, or kidney disease and have symptoms of edema.  You have sudden and unexplained weight gain. Get help right away if:  You have shortness of breath or chest pain.  You cannot breathe when you lie down.  You have pain, redness, or warmth in the swollen areas.  You have heart, liver, or kidney disease and get edema all of a sudden.  You have a fever and your symptoms get worse all of a sudden. Summary  Edema is when you have too much fluid in your body or under your skin.  Edema may make your legs, feet, and ankles swell up. Swelling is also common in looser tissues,  like around your eyes.  Raise (elevate) the swollen body part above the level of your heart when you are sitting or lying down.  Follow your doctor's instructions about diet and how much fluid you can drink (fluid restriction). This information is not intended to replace advice given to you by your health care provider. Make sure you discuss any questions you have with your health care provider. Document Released: 06/09/2007 Document Revised: 01/09/2016 Document Reviewed: 01/09/2016 Elsevier Interactive Patient Education  2017 [employer].

## 2016-06-08 ENCOUNTER — Telehealth: Payer: Self-pay

## 2016-06-08 LAB — COMPREHENSIVE METABOLIC PANEL
ALT: 12 IU/L (ref 0–32)
AST: 15 IU/L (ref 0–40)
Albumin/Globulin Ratio: 1.9 (ref 1.2–2.2)
Albumin: 4.2 g/dL (ref 3.5–4.8)
Alkaline Phosphatase: 77 IU/L (ref 39–117)
BUN/Creatinine Ratio: 19 (ref 12–28)
BUN: 22 mg/dL (ref 8–27)
Bilirubin Total: 0.5 mg/dL (ref 0.0–1.2)
CO2: 24 mmol/L (ref 18–29)
Calcium: 9.5 mg/dL (ref 8.7–10.3)
Chloride: 105 mmol/L (ref 96–106)
Creatinine, Ser: 1.13 mg/dL — ABNORMAL HIGH (ref 0.57–1.00)
GFR calc Af Amer: 57 mL/min/{1.73_m2} — ABNORMAL LOW (ref >59)
GFR calc non Af Amer: 49 mL/min/{1.73_m2} — ABNORMAL LOW (ref >59)
Globulin, Total: 2.2 g/dL (ref 1.5–4.5)
Glucose: 114 mg/dL — ABNORMAL HIGH (ref 65–99)
Potassium: 5.2 mmol/L (ref 3.5–5.2)
Sodium: 145 mmol/L — ABNORMAL HIGH (ref 134–144)
Total Protein: 6.4 g/dL (ref 6.0–8.5)

## 2016-06-08 LAB — CBC WITH DIFFERENTIAL/PLATELET
Basophils Absolute: 0 10*3/uL (ref 0.0–0.2)
Basos: 0 %
EOS (ABSOLUTE): 0.1 10*3/uL (ref 0.0–0.4)
Eos: 1 %
Hematocrit: 38.7 % (ref 34.0–46.6)
Hemoglobin: 12.6 g/dL (ref 11.1–15.9)
Immature Grans (Abs): 0 10*3/uL (ref 0.0–0.1)
Immature Granulocytes: 0 %
Lymphocytes Absolute: 3.3 10*3/uL — ABNORMAL HIGH (ref 0.7–3.1)
Lymphs: 35 %
MCH: 27.3 pg (ref 26.6–33.0)
MCHC: 32.6 g/dL (ref 31.5–35.7)
MCV: 84 fL (ref 79–97)
Monocytes Absolute: 0.8 10*3/uL (ref 0.1–0.9)
Monocytes: 8 %
Neutrophils Absolute: 5.2 10*3/uL (ref 1.4–7.0)
Neutrophils: 56 %
Platelets: 305 10*3/uL (ref 150–379)
RBC: 4.62 x10E6/uL (ref 3.77–5.28)
RDW: 14 % (ref 12.3–15.4)
WBC: 9.5 10*3/uL (ref 3.4–10.8)

## 2016-06-08 LAB — BRAIN NATRIURETIC PEPTIDE: BNP: 153.8 pg/mL — ABNORMAL HIGH (ref 0.0–100.0)

## 2016-06-08 NOTE — Telephone Encounter (Signed)
-----   Message from Mar Daring, PA-C sent at 06/08/2016  3:21 PM EDT ----- Labs are stable. Sodium borderline high so monitor salt intake. Make sure to stay well hydrated and drink plenty of fluids.

## 2016-06-08 NOTE — Telephone Encounter (Signed)
Advised pt of lab results. Pt verbally acknowledges understanding. Emily Drozdowski, CMA   

## 2016-06-09 MED ORDER — ALBUTEROL SULFATE HFA 108 (90 BASE) MCG/ACT IN AERS: 2.0000 | INHALATION_SPRAY | Freq: Four times a day (QID) | RESPIRATORY_TRACT | 5 refills | Status: DC | PRN

## 2016-06-09 NOTE — Addendum Note (Signed)
Addended by: Mar Daring on: 06/09/2016 09:08 AM   Modules accepted: Orders

## 2016-06-10 ENCOUNTER — Other Ambulatory Visit: Payer: Self-pay | Admitting: Physician Assistant

## 2016-06-10 DIAGNOSIS — R600 Localized edema: Secondary | ICD-10-CM

## 2016-06-10 DIAGNOSIS — Z8679 Personal history of other diseases of the circulatory system: Secondary | ICD-10-CM

## 2016-06-26 ENCOUNTER — Telehealth: Payer: Self-pay

## 2016-06-26 NOTE — Telephone Encounter (Signed)
error 

## 2016-07-30 NOTE — Progress Notes (Signed)
updated

## 2016-08-16 ENCOUNTER — Encounter: Payer: Self-pay | Admitting: Emergency Medicine

## 2016-08-16 ENCOUNTER — Emergency Department: Payer: Medicare Other

## 2016-08-16 ENCOUNTER — Encounter: Payer: Self-pay | Admitting: Family Medicine

## 2016-08-16 ENCOUNTER — Observation Stay
Admission: EM | Admit: 2016-08-16 | Discharge: 2016-08-17 | Disposition: A | Discharge status: Home / Self Care | Payer: Medicare Other | Attending: Internal Medicine | Admitting: Internal Medicine

## 2016-08-16 ENCOUNTER — Ambulatory Visit (INDEPENDENT_AMBULATORY_CARE_PROVIDER_SITE_OTHER): Payer: Medicare Other | Admitting: Family Medicine

## 2016-08-16 VITALS — BP 90/52 | HR 78 | Temp 97.5°F | Wt 215.0 lb

## 2016-08-16 DIAGNOSIS — K529 Noninfective gastroenteritis and colitis, unspecified: Secondary | ICD-10-CM | POA: Diagnosis not present

## 2016-08-16 DIAGNOSIS — I959 Hypotension, unspecified: Secondary | ICD-10-CM | POA: Diagnosis not present

## 2016-08-16 DIAGNOSIS — I482 Chronic atrial fibrillation, unspecified: Secondary | ICD-10-CM

## 2016-08-16 DIAGNOSIS — R197 Diarrhea, unspecified: Secondary | ICD-10-CM

## 2016-08-16 DIAGNOSIS — R202 Paresthesia of skin: Secondary | ICD-10-CM

## 2016-08-16 DIAGNOSIS — K921 Melena: Secondary | ICD-10-CM

## 2016-08-16 DIAGNOSIS — I70213 Atherosclerosis of native arteries of extremities with intermittent claudication, bilateral legs: Secondary | ICD-10-CM

## 2016-08-16 DIAGNOSIS — R748 Abnormal levels of other serum enzymes: Secondary | ICD-10-CM | POA: Diagnosis not present

## 2016-08-16 DIAGNOSIS — Z885 Allergy status to narcotic agent status: Secondary | ICD-10-CM | POA: Insufficient documentation

## 2016-08-16 DIAGNOSIS — A020 Salmonella enteritis: Principal | ICD-10-CM | POA: Insufficient documentation

## 2016-08-16 DIAGNOSIS — I69351 Hemiplegia and hemiparesis following cerebral infarction affecting right dominant side: Secondary | ICD-10-CM | POA: Insufficient documentation

## 2016-08-16 DIAGNOSIS — Z90710 Acquired absence of both cervix and uterus: Secondary | ICD-10-CM | POA: Diagnosis not present

## 2016-08-16 DIAGNOSIS — K219 Gastro-esophageal reflux disease without esophagitis: Secondary | ICD-10-CM | POA: Insufficient documentation

## 2016-08-16 DIAGNOSIS — Z7901 Long term (current) use of anticoagulants: Secondary | ICD-10-CM | POA: Insufficient documentation

## 2016-08-16 DIAGNOSIS — E860 Dehydration: Secondary | ICD-10-CM | POA: Diagnosis not present

## 2016-08-16 DIAGNOSIS — Z888 Allergy status to other drugs, medicaments and biological substances status: Secondary | ICD-10-CM | POA: Diagnosis not present

## 2016-08-16 DIAGNOSIS — Z8619 Personal history of other infectious and parasitic diseases: Secondary | ICD-10-CM | POA: Insufficient documentation

## 2016-08-16 DIAGNOSIS — I248 Other forms of acute ischemic heart disease: Secondary | ICD-10-CM | POA: Diagnosis not present

## 2016-08-16 DIAGNOSIS — N179 Acute kidney failure, unspecified: Secondary | ICD-10-CM | POA: Insufficient documentation

## 2016-08-16 DIAGNOSIS — R4781 Slurred speech: Secondary | ICD-10-CM

## 2016-08-16 DIAGNOSIS — Z7982 Long term (current) use of aspirin: Secondary | ICD-10-CM | POA: Insufficient documentation

## 2016-08-16 DIAGNOSIS — Z880 Allergy status to penicillin: Secondary | ICD-10-CM | POA: Diagnosis not present

## 2016-08-16 DIAGNOSIS — Z79899 Other long term (current) drug therapy: Secondary | ICD-10-CM | POA: Insufficient documentation

## 2016-08-16 DIAGNOSIS — R29818 Other symptoms and signs involving the nervous system: Secondary | ICD-10-CM | POA: Diagnosis not present

## 2016-08-16 DIAGNOSIS — Z882 Allergy status to sulfonamides status: Secondary | ICD-10-CM | POA: Insufficient documentation

## 2016-08-16 DIAGNOSIS — I2510 Atherosclerotic heart disease of native coronary artery without angina pectoris: Secondary | ICD-10-CM | POA: Insufficient documentation

## 2016-08-16 DIAGNOSIS — Z87891 Personal history of nicotine dependence: Secondary | ICD-10-CM | POA: Insufficient documentation

## 2016-08-16 DIAGNOSIS — E785 Hyperlipidemia, unspecified: Secondary | ICD-10-CM | POA: Diagnosis not present

## 2016-08-16 DIAGNOSIS — E876 Hypokalemia: Secondary | ICD-10-CM | POA: Insufficient documentation

## 2016-08-16 DIAGNOSIS — R7989 Other specified abnormal findings of blood chemistry: Secondary | ICD-10-CM | POA: Insufficient documentation

## 2016-08-16 DIAGNOSIS — R778 Other specified abnormalities of plasma proteins: Secondary | ICD-10-CM

## 2016-08-16 DIAGNOSIS — G4730 Sleep apnea, unspecified: Secondary | ICD-10-CM | POA: Diagnosis not present

## 2016-08-16 DIAGNOSIS — Z9989 Dependence on other enabling machines and devices: Secondary | ICD-10-CM | POA: Insufficient documentation

## 2016-08-16 DIAGNOSIS — Z8541 Personal history of malignant neoplasm of cervix uteri: Secondary | ICD-10-CM | POA: Insufficient documentation

## 2016-08-16 DIAGNOSIS — R479 Unspecified speech disturbances: Secondary | ICD-10-CM | POA: Diagnosis not present

## 2016-08-16 DIAGNOSIS — Z8249 Family history of ischemic heart disease and other diseases of the circulatory system: Secondary | ICD-10-CM | POA: Insufficient documentation

## 2016-08-16 DIAGNOSIS — Z800 Family history of malignant neoplasm of digestive organs: Secondary | ICD-10-CM | POA: Insufficient documentation

## 2016-08-16 LAB — GASTROINTESTINAL PANEL BY PCR, STOOL (REPLACES STOOL CULTURE)

## 2016-08-16 LAB — CBC
HCT: 38.4 % (ref 35.0–47.0)
Hemoglobin: 12.6 g/dL (ref 12.0–16.0)
MCH: 25.6 pg — ABNORMAL LOW (ref 26.0–34.0)
MCHC: 32.7 g/dL (ref 32.0–36.0)
MCV: 78.2 fL — ABNORMAL LOW (ref 80.0–100.0)
Platelets: 273 10*3/uL (ref 150–440)
RBC: 4.91 MIL/uL (ref 3.80–5.20)
RDW: 16.2 % — ABNORMAL HIGH (ref 11.5–14.5)
WBC: 7.8 10*3/uL (ref 3.6–11.0)

## 2016-08-16 LAB — C DIFFICILE QUICK SCREEN W PCR REFLEX
C Diff antigen: NEGATIVE
C Diff interpretation: NOT DETECTED
C Diff toxin: NEGATIVE

## 2016-08-16 LAB — BASIC METABOLIC PANEL
Anion gap: 7 (ref 5–15)
BUN: 24 mg/dL — ABNORMAL HIGH (ref 6–20)
CO2: 23 mmol/L (ref 22–32)
Calcium: 7.7 mg/dL — ABNORMAL LOW (ref 8.9–10.3)
Chloride: 110 mmol/L (ref 101–111)
Creatinine, Ser: 1.59 mg/dL — ABNORMAL HIGH (ref 0.44–1.00)
GFR calc Af Amer: 37 mL/min — ABNORMAL LOW (ref >60)
GFR calc non Af Amer: 32 mL/min — ABNORMAL LOW (ref >60)
Glucose, Bld: 103 mg/dL — ABNORMAL HIGH (ref 65–99)
Potassium: 3.3 mmol/L — ABNORMAL LOW (ref 3.5–5.1)
Sodium: 140 mmol/L (ref 135–145)

## 2016-08-16 LAB — COMPREHENSIVE METABOLIC PANEL
ALT: 17 U/L (ref 14–54)
AST: 29 U/L (ref 15–41)
Albumin: 3.7 g/dL (ref 3.5–5.0)
Alkaline Phosphatase: 70 U/L (ref 38–126)
Anion gap: 12 (ref 5–15)
BUN: 24 mg/dL — ABNORMAL HIGH (ref 6–20)
CO2: 23 mmol/L (ref 22–32)
Calcium: 8.8 mg/dL — ABNORMAL LOW (ref 8.9–10.3)
Chloride: 106 mmol/L (ref 101–111)
Creatinine, Ser: 1.6 mg/dL — ABNORMAL HIGH (ref 0.44–1.00)
GFR calc Af Amer: 36 mL/min — ABNORMAL LOW (ref >60)
GFR calc non Af Amer: 31 mL/min — ABNORMAL LOW (ref >60)
Glucose, Bld: 128 mg/dL — ABNORMAL HIGH (ref 65–99)
Potassium: 3.1 mmol/L — ABNORMAL LOW (ref 3.5–5.1)
Sodium: 141 mmol/L (ref 135–145)
Total Bilirubin: 1.1 mg/dL (ref 0.3–1.2)
Total Protein: 6.8 g/dL (ref 6.5–8.1)

## 2016-08-16 LAB — MAGNESIUM: Magnesium: 1.9 mg/dL (ref 1.7–2.4)

## 2016-08-16 LAB — LIPASE, BLOOD: Lipase: 35 U/L (ref 11–51)

## 2016-08-16 LAB — PROTIME-INR
INR: 1.57
Prothrombin Time: 18.9 seconds — ABNORMAL HIGH (ref 11.4–15.2)

## 2016-08-16 LAB — APTT: aPTT: 36 seconds (ref 24–36)

## 2016-08-16 LAB — TROPONIN I
Troponin I: 0.03 ng/mL (ref <0.03)
Troponin I: 0.06 ng/mL (ref <0.03)

## 2016-08-16 MED ORDER — TRAZODONE HCL 50 MG PO TABS: 25.0000 mg | ORAL_TABLET | Freq: Every evening | ORAL | Status: DC | PRN

## 2016-08-16 MED ORDER — SODIUM CHLORIDE 0.9 % IV SOLN
INTRAVENOUS | Status: DC
  Administered 2016-08-16 – 2016-08-17 (×2): via INTRAVENOUS

## 2016-08-16 MED ORDER — ASPIRIN EC 81 MG PO TBEC
81.0000 mg | DELAYED_RELEASE_TABLET | Freq: Every day | ORAL | Status: DC
  Administered 2016-08-17: 81 mg via ORAL
  Filled 2016-08-16: qty 1

## 2016-08-16 MED ORDER — METOPROLOL TARTRATE 25 MG PO TABS
25.0000 mg | ORAL_TABLET | Freq: Two times a day (BID) | ORAL | Status: DC
  Administered 2016-08-16 – 2016-08-17 (×2): 25 mg via ORAL
  Filled 2016-08-16 (×2): qty 1

## 2016-08-16 MED ORDER — ACETAMINOPHEN 650 MG RE SUPP: 650.0000 mg | Freq: Four times a day (QID) | RECTAL | Status: DC | PRN

## 2016-08-16 MED ORDER — ALBUTEROL SULFATE (2.5 MG/3ML) 0.083% IN NEBU: 2.5000 mg | INHALATION_SOLUTION | Freq: Four times a day (QID) | RESPIRATORY_TRACT | Status: DC | PRN

## 2016-08-16 MED ORDER — ONDANSETRON HCL 4 MG/2ML IJ SOLN: 4.0000 mg | Freq: Four times a day (QID) | INTRAMUSCULAR | Status: DC | PRN

## 2016-08-16 MED ORDER — LORATADINE 10 MG PO TABS: 10.0000 mg | ORAL_TABLET | Freq: Every day | ORAL | Status: DC | PRN

## 2016-08-16 MED ORDER — METOPROLOL TARTRATE 25 MG PO TABS
25.0000 mg | ORAL_TABLET | Freq: Once | ORAL | Status: AC
  Administered 2016-08-16: 25 mg via ORAL
  Filled 2016-08-16: qty 1

## 2016-08-16 MED ORDER — POTASSIUM CHLORIDE CRYS ER 20 MEQ PO TBCR
40.0000 meq | EXTENDED_RELEASE_TABLET | Freq: Once | ORAL | Status: AC
  Administered 2016-08-16: 40 meq via ORAL
  Filled 2016-08-16: qty 2

## 2016-08-16 MED ORDER — ASPIRIN 81 MG PO CHEW
324.0000 mg | CHEWABLE_TABLET | Freq: Once | ORAL | Status: AC
  Administered 2016-08-16: 324 mg via ORAL
  Filled 2016-08-16: qty 4

## 2016-08-16 MED ORDER — PANTOPRAZOLE SODIUM 40 MG PO TBEC
40.0000 mg | DELAYED_RELEASE_TABLET | Freq: Every day | ORAL | Status: DC
  Administered 2016-08-16 – 2016-08-17 (×2): 40 mg via ORAL
  Filled 2016-08-16 (×2): qty 1

## 2016-08-16 MED ORDER — SODIUM CHLORIDE 0.9 % IV BOLUS (SEPSIS)
1000.0000 mL | Freq: Once | INTRAVENOUS | Status: AC
  Administered 2016-08-16: 1000 mL via INTRAVENOUS

## 2016-08-16 MED ORDER — ACETAMINOPHEN 325 MG PO TABS: 650.0000 mg | ORAL_TABLET | Freq: Four times a day (QID) | ORAL | Status: DC | PRN

## 2016-08-16 MED ORDER — CIPROFLOXACIN HCL 500 MG PO TABS
750.0000 mg | ORAL_TABLET | Freq: Once | ORAL | Status: AC
  Administered 2016-08-16: 750 mg via ORAL
  Filled 2016-08-16: qty 2

## 2016-08-16 MED ORDER — CIPROFLOXACIN HCL 500 MG PO TABS
500.0000 mg | ORAL_TABLET | Freq: Two times a day (BID) | ORAL | Status: DC
  Administered 2016-08-17: 500 mg via ORAL
  Filled 2016-08-16 (×2): qty 1

## 2016-08-16 MED ORDER — ONDANSETRON HCL 4 MG PO TABS: 4.0000 mg | ORAL_TABLET | Freq: Four times a day (QID) | ORAL | Status: DC | PRN

## 2016-08-16 MED ORDER — RIVAROXABAN 20 MG PO TABS
20.0000 mg | ORAL_TABLET | Freq: Every day | ORAL | Status: DC
  Administered 2016-08-16: 20 mg via ORAL
  Filled 2016-08-16: qty 1

## 2016-08-16 NOTE — ED Triage Notes (Signed)
Pt c/o diarrhea since Friday, was sent over from [facility] office for further eval of possible dehydration due to low blood pressure.

## 2016-08-16 NOTE — Progress Notes (Signed)
Patient arrived to 2A Room 255. Patient denies pain and all questions answered. Patient oriented to unit and Fall Safety Plan signed. Skin assessment completed with Yasmin RN and skin intact with scattered ecchymosis noted. A&Ox4, VSS, and Afib on verified tele-box #[id number]. Nursing staff will continue to monitor for any changes in patient status. Earleen Reaper, RN

## 2016-08-16 NOTE — Progress Notes (Signed)
Pt's GI PCR + for Salmonella.  - will order Cipro

## 2016-08-16 NOTE — H&P (Signed)
[facility] NAME: Kayla Maxwell    MR#:  [id number]  DATE OF BIRTH:  1945-01-17  DATE OF ADMISSION:  08/16/2016  PRIMARY CARE PHYSICIAN: Mar Daring, PA-C   REQUESTING/REFERRING PHYSICIAN: Darel Hong, MD  CHIEF COMPLAINT:   Chief Complaint  Patient presents with  . Diarrhea   HISTORY OF PRESENT ILLNESS:  Kayla Maxwell  is a 71 y.o. female with a known history of atrial fibrillation on Xarelto and ASA who reports diarrhea, vomiting, fever and fatigue since 8/10.  Went to see her PCP today.  Developed slurred speech and right arm numbness.  Was found to be hypotensive and was sent to the ED for evaluation at that time.  Code stroke called on arrival. Dr Vernon Prey from neurology saw her and did not feel any further workup is needed.  Patient reports having diarrhea since Friday initially was black to begin with, followed by brown and now it is green in nature.  She has having 7-8 bowel movements daily since then.  No nausea.  She does report some fever.  She does have mild abdominal pain. PAST MEDICAL HISTORY:   Past Medical History:  Diagnosis Date  . Acid reflux    takes Nexium daily  . Allergy    takes Singulair daily as needed  . Anemia   . Atrial fibrillation ([place])   . Cancer ([place]) AGE 4   CERVICAL cancer with removal  . Chronic back pain    stenosis  . Complication of anesthesia   . Constipation    takes Colace daily as needed  . Coronary artery disease   . Difficult intubation   . Dysrhythmia    atrial fibrillation dx 04/2014  . Headache    sinus   . History of blood transfusion   . History of shingles   . Hyperlipidemia    takes Pravastatin daily-started taking this 6 months ago  . Lumbar surgical wound fluid collection   . Sleep apnea    CPAP  . Stroke ([place])    TIA'S X 2 --- one last yr and one this yr...lost her memory over 4-6 hrs.  . Stroke ([place]) 06-04-15   left with right side weakness  .  TIA (transient ischemic attack)    showed up on an MRI but she never knew it  . Urinary frequency     PAST SURGICAL HISTORY:   Past Surgical History:  Procedure Laterality Date  . ABDOMINAL HYSTERECTOMY    . APPENDECTOMY  1963  . BACK SURGERY  08/19/2014   Dr. Hal Neer ([place] [facility])  . BREAST SURGERY  1984   augementation  . [place]  . CHOLECYSTECTOMY  2014   Dr Jamal Collin  . COLONOSCOPY    . ESOPHAGOGASTRODUODENOSCOPY    . HAMMER TOE SURGERY Bilateral   . LUMBAR LAMINECTOMY WITH COFLEX 1 LEVEL Bilateral 07/04/2014   Procedure: Laminectomy and Foraminotomy - bilateral - Lumbar Four-Five with coflex ;  Surgeon: Karie Chimera, MD;  Location: [facility];  Service: Neurosurgery;  Laterality: Bilateral;  . LUMBAR WOUND DEBRIDEMENT N/A 08/19/2014   Procedure: Exploration of Lumbar wound;  Surgeon: Karie Chimera, MD;  Location: [facility] NEUR[facility] ORS;  Service: Neurosurgery;  Laterality: N/A;  . PERIPHERAL VASCULAR CATHETERIZATION Left 08/12/2015   Procedure: Lower Extremity Angiography;  Surgeon: Katha Cabal, MD;  Location: [facility] CV LAB;  Service: Cardiovascular;  Laterality: Left;  . TONSILLECTOMY    . VENTRAL HERNIA  REPAIR N/A 05/26/2015   Procedure: HERNIA REPAIR VENTRAL ADULT;  Surgeon: Christene Lye, MD;  Location: [facility] ORS;  Service: General;  Laterality: N/A;    SOCIAL HISTORY:   Social History  Substance Use Topics  . Smoking status: Former Smoker    Packs/day: 1.00    Years: 45.00    Quit date: 08/19/2014  . Smokeless tobacco: Former Systems developer    Quit date: 08/19/2014  . Alcohol use No     Comment: Rarely    FAMILY HISTORY:   Family History  Problem Relation Age of Onset  . Alzheimer's disease Mother   . Heart attack Father   . Pancreatic cancer Sister   . Healthy Brother     DRUG ALLERGIES:   Allergies  Allergen Reactions  . Codeine Other (See Comments)    Makes her feel "crazy"  . Penicillins Swelling    Has patient had a PCN reaction  causing immediate rash, facial/tongue/throat swelling, SOB or lightheadedness with hypotension: unknown Has patient had a PCN reaction causing severe rash involving mucus membranes or skin necrosis: unknown Has patient had a PCN reaction that required hospitalization unknown Has patient had a PCN reaction occurring within the last 10 years: no If all of the above answers are "NO", then may proceed with Cephalosporin use.  . Adhesive [Tape] Hives  . Sulfa Antibiotics Nausea Only and Rash    REVIEW OF SYSTEMS:   Review of Systems  Constitutional: Positive for malaise/fatigue. Negative for chills, fever and weight loss.  HENT: Negative for nosebleeds and sore throat.   Eyes: Negative for blurred vision.  Respiratory: Negative for cough, shortness of breath and wheezing.   Cardiovascular: Negative for chest pain, orthopnea, leg swelling and PND.  Gastrointestinal: Positive for abdominal pain, nausea and vomiting. Negative for constipation, diarrhea and heartburn.  Genitourinary: Negative for dysuria and urgency.  Musculoskeletal: Negative for back pain.  Skin: Negative for rash.  Neurological: Positive for weakness. Negative for dizziness, speech change, focal weakness and headaches.  Endo/Heme/Allergies: Does not bruise/bleed easily.  Psychiatric/Behavioral: Negative for depression.   MEDICATIONS AT HOME:   Prior to Admission medications   Medication Sig Start Date End Date Taking? Authorizing Provider  albuterol (PROVENTIL HFA;VENTOLIN HFA) 108 (90 Base) MCG/ACT inhaler Inhale 2 puffs into the lungs every 6 (six) hours as needed for wheezing or shortness of breath. 06/09/16  Yes Mar Daring, PA-C  aspirin EC 81 MG EC tablet Take 1 tablet (81 mg total) by mouth daily. 06/08/15  Yes Henreitta Leber, MD  atorvastatin (LIPITOR) 40 MG tablet take 1 tablet by mouth once daily AT 6PM 05/08/16  Yes Fisher, Kirstie Peri, MD  cetirizine (ZYRTEC) 10 MG tablet Take 10 mg by mouth daily as needed  for allergies.    Yes [provider]  esomeprazole (NEXIUM) 40 MG capsule Take 40 mg by mouth daily at 12 noon. Reported on 06/09/2015   Yes [provider]  furosemide (LASIX) 40 MG tablet Take 1 tablet (40 mg total) by mouth daily. 06/07/16  Yes Mar Daring, PA-C  ibuprofen (ADVIL,MOTRIN) 600 MG tablet Take 600 mg by mouth every 6 (six) hours as needed for moderate pain.    Yes [provider]  lisinopril (PRINIVIL,ZESTRIL) 2.5 MG tablet take 1 tablet by mouth once daily 06/10/16  Yes Burnette, Clearnce Sorrel, PA-C  metoprolol tartrate (LOPRESSOR) 25 MG tablet Take 25 mg by mouth 2 (two) times daily. Reported on 06/09/2015   Yes [provider]  montelukast (SINGULAIR) 10 MG tablet Take 1 tablet (10 mg total) by mouth at bedtime. Patient taking differently: Take 10 mg by mouth daily as needed (allergies).  01/23/15  Yes Margarita Rana, MD  Potassium Chloride ER 20 MEQ TBCR Take 1 tablet by mouth daily. 06/07/16  Yes Mar Daring, PA-C  rivaroxaban (XARELTO) 20 MG TABS tablet Take 20 mg by mouth daily with supper. 08/27/14  Yes [provider]      VITAL SIGNS:  Blood pressure 120/73, pulse (!) 102, temperature (!) 97.4 F (36.3 C), temperature source Oral, resp. rate 14, weight 97.5 kg (215 lb), SpO2 94 %.  PHYSICAL EXAMINATION:  Physical Exam  GENERAL:  71 y.o.-year-old patient lying in the bed with no acute distress.  EYES: Pupils equal, round, reactive to light and accommodation. No scleral icterus. Extraocular muscles intact.  HEENT: Head atraumatic, normocephalic. Oropharynx and nasopharynx clear.  NECK:  Supple, no jugular venous distention. No thyroid enlargement, no tenderness.  LUNGS: Normal breath sounds bilaterally, no wheezing, rales,rhonchi or crepitation. No use of accessory muscles of respiration.  CARDIOVASCULAR: S1, S2 normal. No murmurs, rubs, or gallops.  ABDOMEN: Soft, nontender, nondistended. Bowel sounds present. No  organomegaly or mass.  EXTREMITIES: No pedal edema, cyanosis, or clubbing.  NEUROLOGIC: Cranial nerves II through XII are intact. Muscle strength 5/5 in all extremities. Sensation intact. Gait not checked.  PSYCHIATRIC: The patient is alert and oriented x 3.  SKIN: No obvious rash, lesion, or ulcer.  LABORATORY PANEL:   CBC  Recent Labs Lab 08/16/16 1105  WBC 7.8  HGB 12.6  HCT 38.4  PLT 273   ------------------------------------------------------------------------------------------------------------------  Chemistries   Recent Labs Lab 08/16/16 1105 08/16/16 1517  NA 141 140  K 3.1* 3.3*  CL 106 110  CO2 23 23  GLUCOSE 128* 103*  BUN 24* 24*  CREATININE 1.60* 1.59*  CALCIUM 8.8* 7.7*  MG 1.9  --   AST 29  --   ALT 17  --   ALKPHOS 70  --   BILITOT 1.1  --    ------------------------------------------------------------------------------------------------------------------  Cardiac Enzymes  Recent Labs Lab 08/16/16 1517  TROPONINI 0.06*   ------------------------------------------------------------------------------------------------------------------  RADIOLOGY:  Ct Head Code Stroke Wo Contrast  Result Date: 08/16/2016 CLINICAL DATA:  Code stroke. Slurred speech. Diarrhea. Weakness. Last seen well 3 hours ago. EXAM: CT HEAD WITHOUT CONTRAST TECHNIQUE: Contiguous axial images were obtained from the base of the skull through the vertex without intravenous contrast. COMPARISON:  MRI brain 06/07/2015. FINDINGS: Brain: The now remote left precentral gyrus infarct is noted. No other acute infarct is present. There is no hemorrhage or mass lesion. The basal ganglia are intact. The insular ribbon is normal bilaterally. The brainstem and cerebellum are normal. Vascular: Atherosclerotic calcifications are present bilaterally. There is no hyperdense vessel. Skull: Calvarium is intact. No focal lytic or blastic lesions are present. Sinuses/Orbits: The paranasal sinuses and  mastoid air cells are clear. ASPECTS [facility] Stroke Program Early CT Score) - Ganglionic level infarction (caudate, lentiform nuclei, internal capsule, insula, M1-M3 cortex): 7/7 - Supraganglionic infarction (M4-M6 cortex): 3/3 Total score (0-10 with 10 being normal): 10/10 IMPRESSION: 1. No acute intracranial abnormality or significant interval change. 2. Remote posterior left frontal lobe infarct with expected evolution. 3. ASPECTS is 10/10 These results were called by telephone at the time of interpretation on <08/15/16> at <1:02 pm> to Dr. Alfred Levins, who verbally acknowledged these results. Electronically Signed   By: San Morelle M.D.   On: 08/16/2016 13:04  IMPRESSION AND PLAN:  71 year old female with a known history of atrial fibrillation on Xarelto and ASA who reports diarrhea, vomiting, fever and fatigue since 8/10.   * Suspected viral gastroenteritis - Continues to have diarrhea. -  C. difficile negative. - GI panel is pending - Continue aggressive IV hydration  * Hypokalemia - Replete and recheck  * Elevated troponin - Likely due to supply demand ischemia - Monitor on telemetry - Continue aspirin, trend troponins  * Acute renal failure -Likely prerenal from dehydration from ongoing diarrhea - Aggressive IV hydration and monitor renal function - Avoid nephrotoxic medication    All the records are reviewed and case discussed with ED provider. Management plans discussed with the patient, family (Daughter at bedside) and they are in agreement.  CODE STATUS: Full code  TOTAL TIME TAKING CARE OF THIS PATIENT: 45 minutes.    Max Sane M.D on 08/16/2016 at 6:17 PM  Between 7am to 6pm - Pager - [phone]  After 6pm go to [email or web address] - password [email or web address] [facility]  [facility]  Office  [phone]  CC: Primary care physician; Mar Daring, PA-C   Note: This dictation was prepared with Dragon dictation along with smaller phrase  technology. Any transcriptional errors that result from this process are unintentional.

## 2016-08-16 NOTE — Progress Notes (Signed)
Kayla Maxwell received a Code Stroke for PT. Ch reported to ED5 and spent time with the PT and her daughter until results came back that she did not have a stroke. [place] prayed with PT and left room.    08/16/16 1255  Clinical Encounter Type  Visited With Patient and family together  Visit Type Code  Referral From Nurse  Consult/Referral To Chaplain  Spiritual Encounters  Spiritual Needs Prayer;Emotional

## 2016-08-16 NOTE — Progress Notes (Signed)
CRITICAL VALUE ALERT  Critical Value:  GI Panel POSITIVE for Salmonella  Date & Time Notied:  08/16/2016 2035  Provider Notified: Verdell Carmine  Orders Received/Actions taken: MD to place orders.  Earleen Reaper, RN

## 2016-08-16 NOTE — Consult Note (Signed)
Referring Physician: Alfred Levins    Chief Complaint: Slurred speech, right upper extremity numbness  HPI: Kayla Maxwell is an 71 y.o. female with a history of atrial fibrillation on Xarelto and ASA who reports diarrhea, vomiting, fever and fatigue since 8/10.  Went to see her PCP today.  Developed slurred speech and right arm numbness.  Was found to be hypotensive and was sent to the ED for evaluation at that time.  Code stroke called on arrival.  Initial NIHSS of 0.       Date last known well: Date: 08/16/2016 Time last known well: Time: 10:30 tPA Given: No: Resolution of symptoms  Past Medical History:  Diagnosis Date  . Acid reflux    takes Nexium daily  . Allergy    takes Singulair daily as needed  . Anemia   . Atrial fibrillation ([place])   . Cancer ([place]) AGE 21   CERVICAL cancer with removal  . Chronic back pain    stenosis  . Complication of anesthesia   . Constipation    takes Colace daily as needed  . Coronary artery disease   . Difficult intubation   . Dysrhythmia    atrial fibrillation dx 04/2014  . Headache    sinus   . History of blood transfusion   . History of shingles   . Hyperlipidemia    takes Pravastatin daily-started taking this 6 months ago  . Lumbar surgical wound fluid collection   . Sleep apnea    CPAP  . Stroke ([place])    TIA'S X 2 --- one last yr and one this yr...lost her memory over 4-6 hrs.  . Stroke ([place]) 06-04-15   left with right side weakness  . TIA (transient ischemic attack)    showed up on an MRI but she never knew it  . Urinary frequency     Past Surgical History:  Procedure Laterality Date  . ABDOMINAL HYSTERECTOMY    . APPENDECTOMY  1963  . BACK SURGERY  08/19/2014   Dr. Hal Neer ([place] [facility])  . BREAST SURGERY  1984   augementation  . [place]  . CHOLECYSTECTOMY  2014   Dr Jamal Collin  . COLONOSCOPY    . ESOPHAGOGASTRODUODENOSCOPY    . HAMMER TOE SURGERY Bilateral   . LUMBAR LAMINECTOMY WITH COFLEX 1 LEVEL  Bilateral 07/04/2014   Procedure: Laminectomy and Foraminotomy - bilateral - Lumbar Four-Five with coflex ;  Surgeon: Karie Chimera, MD;  Location: [place] [facility];  Service: Neurosurgery;  Laterality: Bilateral;  . LUMBAR WOUND DEBRIDEMENT N/A 08/19/2014   Procedure: Exploration of Lumbar wound;  Surgeon: Karie Chimera, MD;  Location: [facility] [facility];  Service: Neurosurgery;  Laterality: N/A;  . PERIPHERAL VASCULAR CATHETERIZATION Left 08/12/2015   Procedure: Lower Extremity Angiography;  Surgeon: Katha Cabal, MD;  Location: [place] CV [facility];  Service: Cardiovascular;  Laterality: Left;  . TONSILLECTOMY    . VENTRAL HERNIA REPAIR N/A 05/26/2015   Procedure: HERNIA REPAIR VENTRAL ADULT;  Surgeon: Christene Lye, MD;  Location: [facility] ORS;  Service: General;  Laterality: N/A;    Family History  Problem Relation Age of Onset  . Alzheimer's disease Mother   . Heart attack Father   . Pancreatic cancer Sister   . Healthy Brother    Social History:  reports that she quit smoking about 1 years ago. She has a 45.00 pack-year smoking history. She quit smokeless tobacco use about 1 years ago. She reports that she does not drink alcohol  or use drugs.  Allergies:  Allergies  Allergen Reactions  . Codeine Other (See Comments)    Makes her feel "crazy"  . Penicillins Swelling    Has patient had a PCN reaction causing immediate rash, facial/tongue/throat swelling, SOB or lightheadedness with hypotension: unknown Has patient had a PCN reaction causing severe rash involving mucus membranes or skin necrosis: unknown Has patient had a PCN reaction that required hospitalization unknown Has patient had a PCN reaction occurring within the last 10 years: no If all of the above answers are "NO", then may proceed with Cephalosporin use.  . Adhesive [Tape] Hives  . Sulfa Antibiotics Nausea Only and Rash    Medications: I have reviewed the patient's current medications. Prior to Admission:  Prior to  Admission medications   Medication Sig Start Date End Date Taking? Authorizing Provider  albuterol (PROVENTIL HFA;VENTOLIN HFA) 108 (90 Base) MCG/ACT inhaler Inhale 2 puffs into the lungs every 6 (six) hours as needed for wheezing or shortness of breath. 06/09/16  Yes Mar Daring, PA-C  aspirin EC 81 MG EC tablet Take 1 tablet (81 mg total) by mouth daily. 06/08/15  Yes Henreitta Leber, MD  atorvastatin (LIPITOR) 40 MG tablet take 1 tablet by mouth once daily AT 6PM 05/08/16  Yes Fisher, Kirstie Peri, MD  cetirizine (ZYRTEC) 10 MG tablet Take 10 mg by mouth daily as needed for allergies.    Yes [provider]  esomeprazole (NEXIUM) 40 MG capsule Take 40 mg by mouth daily at 12 noon. Reported on 06/09/2015   Yes [provider]  furosemide (LASIX) 40 MG tablet Take 1 tablet (40 mg total) by mouth daily. 06/07/16  Yes Mar Daring, PA-C  ibuprofen (ADVIL,MOTRIN) 600 MG tablet Take 600 mg by mouth every 6 (six) hours as needed for moderate pain.    Yes [provider]  lisinopril (PRINIVIL,ZESTRIL) 2.5 MG tablet take 1 tablet by mouth once daily 06/10/16  Yes Burnette, Clearnce Sorrel, PA-C  metoprolol tartrate (LOPRESSOR) 25 MG tablet Take 25 mg by mouth 2 (two) times daily. Reported on 06/09/2015   Yes [provider]  montelukast (SINGULAIR) 10 MG tablet Take 1 tablet (10 mg total) by mouth at bedtime. Patient taking differently: Take 10 mg by mouth daily as needed (allergies).  01/23/15  Yes Margarita Rana, MD  Potassium Chloride ER 20 MEQ TBCR Take 1 tablet by mouth daily. 06/07/16  Yes Mar Daring, PA-C  rivaroxaban (XARELTO) 20 MG TABS tablet Take 20 mg by mouth daily with supper. 08/27/14  Yes [provider]     ROS: History obtained from the patient  General ROS:  fatigue, fever Psychological ROS: negative for - behavioral disorder, hallucinations, memory difficulties, mood swings or suicidal ideation Ophthalmic ROS: negative for - blurry  vision, double vision, eye pain or loss of vision ENT ROS: negative for - epistaxis, nasal discharge, oral lesions, sore throat, tinnitus or vertigo Allergy and Immunology ROS: negative for - hives or itchy/watery eyes Hematological and Lymphatic ROS: negative for - bleeding problems, bruising or swollen lymph nodes Endocrine ROS: negative for - galactorrhea, hair pattern changes, polydipsia/polyuria or temperature intolerance Respiratory ROS: negative for - cough, hemoptysis, shortness of breath or wheezing Cardiovascular ROS: negative for - chest pain, dyspnea on exertion, edema or irregular heartbeat Gastrointestinal ROS: diarrhea, emesis, nausea Genito-Urinary ROS: negative for - dysuria, hematuria, incontinence or urinary frequency/urgency Musculoskeletal ROS: negative for - joint swelling or muscular weakness Neurological ROS: as noted in HPI Dermatological  ROS: negative for rash and skin lesion changes  Physical Examination: Blood pressure (!) 116/59, pulse (!) 130, temperature (!) 97.4 F (36.3 C), temperature source Oral, resp. rate (!) 25, weight 97.5 kg (215 lb), SpO2 92 %.  HEENT-  Normocephalic, no lesions, without obvious abnormality.  Normal external eye and conjunctiva.  Normal TM's bilaterally.  Normal auditory canals and external ears. Normal external nose, mucus membranes and septum.  Normal pharynx. Cardiovascular- S1, S2 normal, pulses palpable throughout   Lungs- chest clear, no wheezing, rales, normal symmetric air entry Abdomen- soft, non-tender; bowel sounds normal; no masses,  no organomegaly Extremities- no edema Lymph-no adenopathy palpable Musculoskeletal-no joint tenderness, deformity or swelling Skin-warm and dry, no hyperpigmentation, vitiligo, or suspicious lesions  Neurological Examination   Mental Status: Alert, oriented, thought content appropriate.  Speech fluent without evidence of aphasia.  Able to follow 3 step commands without difficulty. Cranial  Nerves: II: Discs flat bilaterally; Visual fields grossly normal, pupils equal, round, reactive to light and accommodation III,IV, VI: ptosis not present, extra-ocular motions intact bilaterally V,VII: mild decrease in the right NLF, smile symmetric, facial light touch sensation normal bilaterally VIII: hearing normal bilaterally IX,X: gag reflex present XI: bilateral shoulder shrug XII: midline tongue extension Motor: Right : Upper extremity   5/5    Left:     Upper extremity   5/5  Lower extremity   5/5     Lower extremity   5/5 Tone and bulk:normal tone throughout; no atrophy noted Sensory: Pinprick and light touch intact throughout, bilaterally Deep Tendon Reflexes: 2+ in the upper extremities and absent in the lower extremities Plantars: Right: downgoing   Left: downgoing Cerebellar: Normal finger-to-nose and normal heel-to-shin testing bilaterally Gait: not tested due to safety concerns    Laboratory Studies:  Basic Metabolic Panel:  Recent Labs Lab 08/16/16 1105  NA 141  K 3.1*  CL 106  CO2 23  GLUCOSE 128*  BUN 24*  CREATININE 1.60*  CALCIUM 8.8*    Liver Function Tests:  Recent Labs Lab 08/16/16 1105  AST 29  ALT 17  ALKPHOS 70  BILITOT 1.1  PROT 6.8  ALBUMIN 3.7    Recent Labs Lab 08/16/16 1105  LIPASE 35   No results for input(s): AMMONIA in the last 168 hours.  CBC:  Recent Labs Lab 08/16/16 1105  WBC 7.8  HGB 12.6  HCT 38.4  MCV 78.2*  PLT 273    Cardiac Enzymes: No results for input(s): CKTOTAL, CKMB, CKMBINDEX, TROPONINI in the last 168 hours.  BNP: Invalid input(s): POCBNP  CBG: No results for input(s): GLUCAP in the last 168 hours.  Microbiology: Results for orders placed or performed in visit on 09/05/14  Urine culture     Status: Abnormal   Collection Time: 09/05/14 12:00 AM  Result Value Ref Range Status   Urine Culture, Routine Final report (A)  Final   Organism ID, Bacteria Klebsiella pneumoniae (A)  Final     Comment: Greater than 100,000 colony forming units per mL   Antimicrobial Susceptibility Comment  Final    Comment:       ** S = Susceptible; I = Intermediate; R = Resistant **                    P = Positive; N = Negative             MICS are expressed in micrograms per mL    Antibiotic  RSLT#1    RSLT#2    RSLT#3    RSLT#4 Amoxicillin/Clavulanic Acid    S Ampicillin                     R Cefepime                       S Ceftriaxone                    S Cefuroxime                     S Cephalothin                    S Ciprofloxacin                  S Ertapenem                      S Gentamicin                     S Imipenem                       S Levofloxacin                   S Nitrofurantoin                 S Piperacillin                   R Tetracycline                   S Tobramycin                     S Trimethoprim/Sulfa             S     Coagulation Studies: No results for input(s): LABPROT, INR in the last 72 hours.  Urinalysis: No results for input(s): COLORURINE, LABSPEC, PHURINE, GLUCOSEU, HGBUR, BILIRUBINUR, KETONESUR, PROTEINUR, UROBILINOGEN, NITRITE, LEUKOCYTESUR in the last 168 hours.  Invalid input(s): APPERANCEUR  Lipid Panel:    Component Value Date/Time   CHOL 138 06/30/2015 1132   CHOL 201 (H) 11/09/2013 1155   TRIG 180 (H) 06/30/2015 1132   TRIG 195 11/09/2013 1155   HDL 55 06/30/2015 1132   HDL 45 11/09/2013 1155   CHOLHDL 2.5 06/30/2015 1132   VLDL 39 11/09/2013 1155   LDLCALC 47 06/30/2015 1132   LDLCALC 117 (H) 11/09/2013 1155    HgbA1C:  Lab Results  Component Value Date   HGBA1C 5.8 06/06/2015    Urine Drug Screen:  No results found for: LABOPIA, COCAINSCRNUR, LABBENZ, AMPHETMU, THCU, LABBARB  Alcohol Level: No results for input(s): ETH in the last 168 hours.   Imaging: Ct Head Code Stroke Wo Contrast  Result Date: 08/16/2016 CLINICAL DATA:  Code stroke. Slurred speech. Diarrhea. Weakness. Last seen well 3 hours  ago. EXAM: CT HEAD WITHOUT CONTRAST TECHNIQUE: Contiguous axial images were obtained from the base of the skull through the vertex without intravenous contrast. COMPARISON:  MRI brain 06/07/2015. FINDINGS: Brain: The now remote left precentral gyrus infarct is noted. No other acute infarct is present. There is no hemorrhage or mass lesion. The basal ganglia are intact. The insular ribbon is normal bilaterally. The brainstem and cerebellum are normal. Vascular: Atherosclerotic calcifications are present bilaterally. There is no hyperdense  vessel. Skull: Calvarium is intact. No focal lytic or blastic lesions are present. Sinuses/Orbits: The paranasal sinuses and mastoid air cells are clear. ASPECTS [facility] Stroke Program Early CT Score) - Ganglionic level infarction (caudate, lentiform nuclei, internal capsule, insula, M1-M3 cortex): 7/7 - Supraganglionic infarction (M4-M6 cortex): 3/3 Total score (0-10 with 10 being normal): 10/10 IMPRESSION: 1. No acute intracranial abnormality or significant interval change. 2. Remote posterior left frontal lobe infarct with expected evolution. 3. ASPECTS is 10/10 These results were called by telephone at the time of interpretation on <08/15/16> at <1:02 pm> to Dr. Alfred Levins, who verbally acknowledged these results. Electronically Signed   By: San Morelle M.D.   On: 08/16/2016 13:04    Assessment: 71 y.o. female with a history of atrial fibrillation on ASA and Xarelto who presents hypotensive with slurred speech and right upper extremity numbness.  Symptoms have resolved.  Head CT reviewed and shows evidence of chronic infarct but no acute changes.  No evidence of hemorrhage.  Suspect symptoms related to hypotension in the setting of chronic infarct and carotid stenosis.  Neither tPA nor intervention indicated at this time.    Stroke Risk Factors - atrial fibrillation, carotid stenosis and hyperlipidemia  Plan: 1. No further imaging indicated at this time. 2.  Prophylactic therapy-Continue Xarelto and ASA 3. NPO until RN stroke swallow screen 4. Telemetry monitoring 5. Frequent neuro checks 6. Agree with addressing hypotension and GI illness   Case discussed with Dr. Unk Lightning, MD Neurology [phone] 08/16/2016, 1:19 PM

## 2016-08-16 NOTE — ED Provider Notes (Signed)
Care signed over from Dr. Alfred Levins pending repeat troponin and basic metabolic panel after hydration. Briefly the patient is a 71 year old woman who comes to the emergency department with diarrhea fatigue and hypotension. She was initially a stroke team activation but neurology felt her symptoms are secondary to recrudescence from her illness. Her troponin went from 0.03 to 0.06 on recheck and her acute kidney injury is not resolves. She does not have C. difficile however I'm concerned she does have bacterial enteritis or require inpatient admission for further troponins, IV hydration, and ciprofloxacin.   Darel Hong, MD 08/16/16 [phone]

## 2016-08-16 NOTE — Progress Notes (Signed)
Patient: Kayla Maxwell Female    DOB: 1945-05-21   71 y.o.   MRN: [id number] Visit Date: 08/16/2016  Today's Provider: Vernie Murders, PA   Chief Complaint  Patient presents with  . Diarrhea  . Fatigue  . Fever  . Emesis   Subjective:    Diarrhea   This is a new problem. Episode onset: Friday afternoon. The problem occurs 5 to 10 times per day. The problem has been unchanged. The stool consistency is described as watery (black colored stool when diarrhea first started, now stool is brown). The patient states that diarrhea awakens her from sleep. Associated symptoms include a fever, headaches and vomiting (subsided). Associated symptoms comments: Fatigue and nausea . Nothing aggravates the symptoms. There are no known risk factors. She has tried bismuth subsalicylate for the symptoms. The treatment provided no relief.   Patient Active Problem List   Diagnosis Date Noted  . Bilateral carotid artery stenosis 03/12/2016  . Fine tremor 03/12/2016  . PAD (peripheral artery disease) ([place]) 01/14/2016  . Atherosclerotic peripheral vascular disease with intermittent claudication ([place]) 11/10/2015  . Cough 06/09/2015  . Abnormal blood sugar 04/11/2015  . Anxiety 11/19/2014  . PTSD (post-traumatic stress disorder) 10/29/2014  . Abnormal hemoglobin ([place]) 09/05/2014  . Allergic rhinitis 09/05/2014  . HLD (hyperlipidemia) 09/05/2014  . Mild memory disturbance 09/05/2014  . H/O stroke without residual deficits 09/05/2014  . Edema 09/05/2014  . Spinal stenosis, lumbar 07/04/2014  . MI (mitral incompetence) 05/08/2014  . TI (tricuspid incompetence) 05/08/2014  . Chronic atrial fibrillation ([place]) 05/08/2014  . History of tobacco abuse 04/29/2014  . Gastro-esophageal reflux disease without esophagitis 04/29/2014  . Obstructive apnea 04/29/2014  . Atrial fibrillation by electrocardiography ([place]) 04/29/2014  . Cigarette nicotine dependence with nicotine-induced disorder 04/29/2014  . Neuritis  or radiculitis due to rupture of lumbar intervertebral disc 09/21/2013  . DDD (degenerative disc disease), lumbar 08/24/2013  . Bursitis, trochanteric 06/18/2013  . Nerve root inflammation 06/18/2013  . Arthralgia of hip 06/04/2013  . Calculus of gallbladder with other cholecystitis, without mention of obstruction 04/27/2012   Past Medical History:  Diagnosis Date  . Acid reflux    takes Nexium daily  . Allergy    takes Singulair daily as needed  . Anemia   . Atrial fibrillation ([place])   . Cancer ([place]) AGE 73   CERVICAL cancer with removal  . Chronic back pain    stenosis  . Complication of anesthesia   . Constipation    takes Colace daily as needed  . Coronary artery disease   . Difficult intubation   . Dysrhythmia    atrial fibrillation dx 04/2014  . Headache    sinus   . History of blood transfusion   . History of shingles   . Hyperlipidemia    takes Pravastatin daily-started taking this 6 months ago  . Lumbar surgical wound fluid collection   . Sleep apnea    CPAP  . Stroke ([place])    TIA'S X 2 --- one last yr and one this yr...lost her memory over 4-6 hrs.  . Stroke ([place]) 06-04-15   left with right side weakness  . TIA (transient ischemic attack)    showed up on an MRI but she never knew it  . Urinary frequency    Past Surgical History:  Procedure Laterality Date  . ABDOMINAL HYSTERECTOMY    . APPENDECTOMY  1963  . BACK SURGERY  08/19/2014   Dr. Hal Neer ([place] otho)  . BREAST  SURGERY  1984   augementation  . [place]  . CHOLECYSTECTOMY  2014   Dr Jamal Collin  . COLONOSCOPY    . ESOPHAGOGASTRODUODENOSCOPY    . HAMMER TOE SURGERY Bilateral   . LUMBAR LAMINECTOMY WITH COFLEX 1 LEVEL Bilateral 07/04/2014   Procedure: Laminectomy and Foraminotomy - bilateral - Lumbar Four-Five with coflex ;  Surgeon: Karie Chimera, MD;  Location: [place] [facility] ORS;  Service: Neurosurgery;  Laterality: Bilateral;  . LUMBAR WOUND DEBRIDEMENT N/A 08/19/2014   Procedure:  Exploration of Lumbar wound;  Surgeon: Karie Chimera, MD;  Location: [place] NE[facility] ORS;  Service: Neurosurgery;  Laterality: N/A;  . PERIPHERAL VASCULAR CATHETERIZATION Left 08/12/2015   Procedure: Lower Extremity Angiography;  Surgeon: Katha Cabal, MD;  Location: [place] CV LAB;  Service: Cardiovascular;  Laterality: Left;  . TONSILLECTOMY    . VENTRAL HERNIA REPAIR N/A 05/26/2015   Procedure: HERNIA REPAIR VENTRAL ADULT;  Surgeon: Christene Lye, MD;  Location: [facility] ORS;  Service: General;  Laterality: N/A;   Family History  Problem Relation Age of Onset  . Alzheimer's disease Mother   . Heart attack Father   . Pancreatic cancer Sister   . Healthy Brother    Allergies  Allergen Reactions  . Codeine Other (See Comments)    Makes her feel "crazy"  . Penicillins Swelling    Has patient had a PCN reaction causing immediate rash, facial/tongue/throat swelling, SOB or lightheadedness with hypotension: unknown Has patient had a PCN reaction causing severe rash involving mucus membranes or skin necrosis: unknown Has patient had a PCN reaction that required hospitalization unknown Has patient had a PCN reaction occurring within the last 10 years: no If all of the above answers are "NO", then may proceed with Cephalosporin use.  . Adhesive [Tape] Hives  . Sulfa Antibiotics Nausea Only and Rash     Previous Medications   ALBUTEROL (PROVENTIL HFA;VENTOLIN HFA) 108 (90 BASE) MCG/ACT INHALER    Inhale 2 puffs into the lungs every 6 (six) hours as needed for wheezing or shortness of breath.   ASPIRIN EC 81 MG EC TABLET    Take 1 tablet (81 mg total) by mouth daily.   ATORVASTATIN (LIPITOR) 40 MG TABLET    take 1 tablet by mouth once daily AT 6PM   CETIRIZINE (ZYRTEC) 10 MG TABLET    Take 10 mg by mouth daily as needed for allergies.    ESOMEPRAZOLE (NEXIUM) 40 MG CAPSULE    Take 40 mg by mouth daily at 12 noon. Reported on 06/09/2015   FUROSEMIDE (LASIX) 40 MG TABLET    Take 1 tablet  (40 mg total) by mouth daily.   IBUPROFEN (ADVIL,MOTRIN) 600 MG TABLET    Take 600 mg by mouth every 6 (six) hours as needed for moderate pain.    LISINOPRIL (PRINIVIL,ZESTRIL) 2.5 MG TABLET    take 1 tablet by mouth once daily   METOPROLOL TARTRATE (LOPRESSOR) 25 MG TABLET    Take 25 mg by mouth 2 (two) times daily. Reported on 06/09/2015   MONTELUKAST (SINGULAIR) 10 MG TABLET    Take 1 tablet (10 mg total) by mouth at bedtime.   POTASSIUM CHLORIDE ER 20 MEQ TBCR    Take 1 tablet by mouth daily.   RIVAROXABAN (XARELTO) 20 MG TABS TABLET    Take 20 mg by mouth daily with supper.    Review of Systems  Constitutional: Positive for fatigue and fever.  Respiratory: Negative.   Cardiovascular: Negative.  Gastrointestinal: Positive for diarrhea and vomiting (subsided).  Neurological: Positive for headaches.    Social History  Substance Use Topics  . Smoking status: Former Smoker    Packs/day: 1.00    Years: 45.00    Quit date: 08/19/2014  . Smokeless tobacco: Former Systems developer    Quit date: 08/19/2014  . Alcohol use No     Comment: Rarely   Objective:   Temp (!) 97.5 F (36.4 C) (Oral)   Wt 215 lb (97.5 kg)   SpO2 96%   BMI 35.78 kg/m  BP Readings from Last 3 Encounters:  06/07/16 116/76  02/19/16 140/90  01/14/16 129/89   Wt Readings from Last 3 Encounters:  08/16/16 215 lb (97.5 kg)  06/07/16 223 lb 12.8 oz (101.5 kg)  02/19/16 215 lb (97.5 kg)    Physical Exam  Constitutional: She appears well-developed. She appears distressed.  HENT:  Head: Normocephalic.  Eyes: Conjunctivae are normal.  Neck: Neck supple.  Cardiovascular:  Irregularly irregular rhythm with tachycardia.  Pulmonary/Chest: She has no wheezes. She has no rales.  Increased respiration rate with exertion.  Abdominal: Soft.  Slight generalized discomfort. No masses.  Lymphadenopathy:    She has no cervical adenopathy.   Assessment & Plan:     1. Diarrhea, unspecified type Onset with 5-10 liquid stools  since 08-13-16. Nauseated but only vomited once. Not eating all weekend. Some water intake but very weak and lightheaded when standing.   2. Dehydration Very weak and BP difficult to auscultate - finally got 90/52 with tachycardia. Not eating or drinking much during the weekend. Sent to ER now for IV hydration.  3. Melena Black stools at onset of diarrhea on 08-13-16. No hematemesis. Has generalized abdominal soreness. No masses palpable. Sent to ER for possible GI bleed.  4. Chronic atrial fibrillation (H[facility]) Heart rate 102 and very irregular. No chest pain but DOE.

## 2016-08-16 NOTE — ED Provider Notes (Signed)
[facility] Emergency Department Provider Note  ____________________________________________  Time seen: Approximately 1:43 PM  I have reviewed the triage vital signs and the nursing notes.   HISTORY  Chief Complaint Diarrhea   HPI Kayla Maxwell is a 71 y.o. female with a history of CVA, atrial fibrillation on xarelto, anemia, coronary artery disease who presents for evaluation of hypotension and diarrhea. Patient has had 3 days of several daily episodes of diarrhea. Initially black however now brown. No nausea or vomiting, no fever or chills, no abdominal pain. No recent antibiotic use or history of C. difficile. Patient went to her primary care doctor today for evaluation of diarrhea and was found to be hypotensive and sent to the emergency room for further evaluation. Patient's daughter noted that at 10:30 AM while in the waiting room patient started slurring her speech and developed right-sided numbness which made her concerned her mother was having another stroke. Patient denies any weakness or numbness at this time however does have slurred. No chest pain, shortness of breath, headache.  Past Medical History:  Diagnosis Date  . Acid reflux    takes Nexium daily  . Allergy    takes Singulair daily as needed  . Anemia   . Atrial fibrillation ([place])   . Cancer ([place]) AGE 23   CERVICAL cancer with removal  . Chronic back pain    stenosis  . Complication of anesthesia   . Constipation    takes Colace daily as needed  . Coronary artery disease   . Difficult intubation   . Dysrhythmia    atrial fibrillation dx 04/2014  . Headache    sinus   . History of blood transfusion   . History of shingles   . Hyperlipidemia    takes Pravastatin daily-started taking this 6 months ago  . Lumbar surgical wound fluid collection   . Sleep apnea    CPAP  . Stroke ([place])    TIA'S X 2 --- one last yr and one this yr...lost her memory over 4-6 hrs.  . Stroke ([place])  06-04-15   left with right side weakness  . TIA (transient ischemic attack)    showed up on an MRI but she never knew it  . Urinary frequency     Patient Active Problem List   Diagnosis Date Noted  . Bilateral carotid artery stenosis 03/12/2016  . Fine tremor 03/12/2016  . PAD (peripheral artery disease) ([place]) 01/14/2016  . Atherosclerotic peripheral vascular disease with intermittent claudication ([place]) 11/10/2015  . Cough 06/09/2015  . Abnormal blood sugar 04/11/2015  . Anxiety 11/19/2014  . PTSD (post-traumatic stress disorder) 10/29/2014  . Abnormal hemoglobin ([place]) 09/05/2014  . Allergic rhinitis 09/05/2014  . HLD (hyperlipidemia) 09/05/2014  . Mild memory disturbance 09/05/2014  . H/O stroke without residual deficits 09/05/2014  . Edema 09/05/2014  . Spinal stenosis, lumbar 07/04/2014  . MI (mitral incompetence) 05/08/2014  . TI (tricuspid incompetence) 05/08/2014  . Chronic atrial fibrillation ([place]) 05/08/2014  . History of tobacco abuse 04/29/2014  . Gastro-esophageal reflux disease without esophagitis 04/29/2014  . Obstructive apnea 04/29/2014  . Atrial fibrillation by electrocardiography ([place]) 04/29/2014  . Cigarette nicotine dependence with nicotine-induced disorder 04/29/2014  . Neuritis or radiculitis due to rupture of lumbar intervertebral disc 09/21/2013  . DDD (degenerative disc disease), lumbar 08/24/2013  . Bursitis, trochanteric 06/18/2013  . Nerve root inflammation 06/18/2013  . Arthralgia of hip 06/04/2013  . Calculus of gallbladder with other cholecystitis, without mention of obstruction  04/27/2012    Past Surgical History:  Procedure Laterality Date  . ABDOMINAL HYSTERECTOMY    . APPENDECTOMY  1963  . BACK SURGERY  08/19/2014   Dr. Hal Neer ([place] [facility])  . BREAST SURGERY  1984   augementation  . [place]  . CHOLECYSTECTOMY  2014   Dr Jamal Collin  . COLONOSCOPY    . ESOPHAGOGASTRODUODENOSCOPY    . HAMMER TOE SURGERY Bilateral   .  LUMBAR LAMINECTOMY WITH COFLEX 1 LEVEL Bilateral 07/04/2014   Procedure: Laminectomy and Foraminotomy - bilateral - Lumbar Four-Five with coflex ;  Surgeon: Karie Chimera, MD;  Location: [place] NEURO ORS;  Service: Neurosurgery;  Laterality: Bilateral;  . LUMBAR WOUND DEBRIDEMENT N/A 08/19/2014   Procedure: Exploration of Lumbar wound;  Surgeon: Karie Chimera, MD;  Location: [place] NEURO ORS;  Service: Neurosurgery;  Laterality: N/A;  . PERIPHERAL VASCULAR CATHETERIZATION Left 08/12/2015   Procedure: Lower Extremity Angiography;  Surgeon: Katha Cabal, MD;  Location: [place] CV LAB;  Service: Cardiovascular;  Laterality: Left;  . TONSILLECTOMY    . VENTRAL HERNIA REPAIR N/A 05/26/2015   Procedure: HERNIA REPAIR VENTRAL ADULT;  Surgeon: Christene Lye, MD;  Location: [facility] ORS;  Service: General;  Laterality: N/A;    Prior to Admission medications   Medication Sig Start Date End Date Taking? Authorizing Provider  albuterol (PROVENTIL HFA;VENTOLIN HFA) 108 (90 Base) MCG/ACT inhaler Inhale 2 puffs into the lungs every 6 (six) hours as needed for wheezing or shortness of breath. 06/09/16  Yes Mar Daring, PA-C  aspirin EC 81 MG EC tablet Take 1 tablet (81 mg total) by mouth daily. 06/08/15  Yes Henreitta Leber, MD  atorvastatin (LIPITOR) 40 MG tablet take 1 tablet by mouth once daily AT 6PM 05/08/16  Yes Fisher, Kirstie Peri, MD  cetirizine (ZYRTEC) 10 MG tablet Take 10 mg by mouth daily as needed for allergies.    Yes [provider]  esomeprazole (NEXIUM) 40 MG capsule Take 40 mg by mouth daily at 12 noon. Reported on 06/09/2015   Yes [provider]  furosemide (LASIX) 40 MG tablet Take 1 tablet (40 mg total) by mouth daily. 06/07/16  Yes Mar Daring, PA-C  ibuprofen (ADVIL,MOTRIN) 600 MG tablet Take 600 mg by mouth every 6 (six) hours as needed for moderate pain.    Yes [provider]  lisinopril (PRINIVIL,ZESTRIL) 2.5 MG tablet take 1 tablet by mouth once  daily 06/10/16  Yes Burnette, Clearnce Sorrel, PA-C  metoprolol tartrate (LOPRESSOR) 25 MG tablet Take 25 mg by mouth 2 (two) times daily. Reported on 06/09/2015   Yes [provider]  montelukast (SINGULAIR) 10 MG tablet Take 1 tablet (10 mg total) by mouth at bedtime. Patient taking differently: Take 10 mg by mouth daily as needed (allergies).  01/23/15  Yes Margarita Rana, MD  Potassium Chloride ER 20 MEQ TBCR Take 1 tablet by mouth daily. 06/07/16  Yes Mar Daring, PA-C  rivaroxaban (XARELTO) 20 MG TABS tablet Take 20 mg by mouth daily with supper. 08/27/14  Yes [provider]    Allergies Codeine; Penicillins; Adhesive [tape]; and Sulfa antibiotics  Family History  Problem Relation Age of Onset  . Alzheimer's disease Mother   . Heart attack Father   . Pancreatic cancer Sister   . Healthy Brother     Social History Social History  Substance Use Topics  . Smoking status: Former Smoker    Packs/day: 1.00    Years: 45.00  Quit date: 08/19/2014  . Smokeless tobacco: Former Systems developer    Quit date: 08/19/2014  . Alcohol use No     Comment: Rarely    Review of Systems  Constitutional: Negative for fever. Eyes: Negative for visual changes. ENT: Negative for sore throat. Neck: No neck pain  Cardiovascular: Negative for chest pain. Respiratory: Negative for shortness of breath. Gastrointestinal: Negative for abdominal pain, vomiting. + diarrhea. Genitourinary: Negative for dysuria. Musculoskeletal: Negative for back pain. Skin: Negative for rash. Neurological: Negative for headaches. + Slurred speech and right-sided numbness  Psych: No SI or HI  ____________________________________________   PHYSICAL EXAM:  VITAL SIGNS: ED Triage Vitals  Enc Vitals Group     BP 08/16/16 1106 (!) 103/52     Pulse Rate 08/16/16 1106 72     Resp 08/16/16 1106 18     Temp 08/16/16 1106 (!) 97.4 F (36.3 C)     Temp Source 08/16/16 1106 Oral     SpO2 08/16/16 1106 96 %      Weight 08/16/16 1107 215 lb (97.5 kg)     Height --      Head Circumference --      Peak Flow --      Pain Score --      Pain Loc --      Pain Edu? --      Excl. in [place]? --     Constitutional: Alert and oriented,  no apparent distress. HEENT:      Head: Normocephalic and atraumatic.         Eyes: Conjunctivae are normal. Sclera is non-icteric.       Mouth/Throat: Mucous membranes are moist.       Neck: Supple with no signs of meningismus. Cardiovascular: Irregularly irregular rhythm with tachycardic rate. 2+ symmetrical distal pulses are present in all extremities. No JVD. Respiratory: Normal respiratory effort. Lungs are clear to auscultation bilaterally. No wheezes, crackles, or rhonchi.  Gastrointestinal: Soft, non tender, and non distended with positive bowel sounds. No rebound or guarding. Musculoskeletal: Nontender with normal range of motion in all extremities. No edema, cyanosis, or erythema of extremities. Neurologic: Slurred speech A & O x3, PERRL, no nystagmus, CN II-XII intact, motor testing reveals good tone and bulk throughout. There is no evidence of pronator drift or dysmetria. Muscle strength is 5/5 throughout. Sensory examination is intact. Gait is normal. Skin: Skin is warm, dry and intact. No rash noted. Psychiatric: Mood and affect are normal. Speech and behavior are normal.  NIH Stroke Scale  Interval: Baseline Time: 3:03 PM Person Administering Scale: [place] stroke scale items in the order listed. Record performance in each category after each subscale exam. Do not go back and change scores. Follow directions provided for each exam technique. Scores should reflect what the patient does, not what the clinician thinks the patient can do. The clinician should record answers while administering the exam and work quickly. Except where indicated, the patient should not be coached (i.e., repeated requests to patient to make a special  effort).   1a  Level of consciousness: 0=alert; keenly responsive  1b. LOC questions:  0=Performs both tasks correctly  1c. LOC commands: 0=Performs both tasks correctly  2.  Best Gaze: 0=normal  3.  Visual: 0=No visual loss  4. Facial Palsy: 0=Normal symmetric movement  5a.  Motor left arm: 0=No drift, limb holds 90 (or 45) degrees for full 10 seconds  5b.  Motor right arm: 0=No drift, limb holds 90 (  or 45) degrees for full 10 seconds  6a. motor left leg: 0=No drift, limb holds 90 (or 45) degrees for full 10 seconds  6b  Motor right leg:  0=No drift, limb holds 90 (or 45) degrees for full 10 seconds  7. Limb Ataxia: 0=Absent  8.  Sensory: 0=Normal; no sensory loss  9. Best Language:  0=No aphasia, normal  10. Dysarthria: 1=Mild to moderate, patient slurs at least some words and at worst, can be understood with some difficulty  11. Extinction and Inattention: 0=No abnormality   Total:   1    ____________________________________________   LABS (all labs ordered are listed, but only abnormal results are displayed)  Labs Reviewed  COMPREHENSIVE METABOLIC PANEL - Abnormal; Notable for the following:       Result Value   Potassium 3.1 (*)    Glucose, Bld 128 (*)    BUN 24 (*)    Creatinine, Ser 1.60 (*)    Calcium 8.8 (*)    GFR calc non Af Amer 31 (*)    GFR calc Af Amer 36 (*)    All other components within normal limits  CBC - Abnormal; Notable for the following:    MCV 78.2 (*)    MCH 25.6 (*)    RDW 16.2 (*)    All other components within normal limits  PROTIME-INR - Abnormal; Notable for the following:    Prothrombin Time 18.9 (*)    All other components within normal limits  C DIFFICILE QUICK SCREEN W PCR REFLEX  LIPASE, BLOOD  APTT  MAGNESIUM  URINALYSIS, COMPLETE (UACMP) WITH MICROSCOPIC  TROPONIN I   ____________________________________________  EKG  ED ECG REPORT I, Rudene Re, the attending physician, personally viewed and interpreted this  ECG.  A. fib, rate of 118, normal QRS and QTc intervals, normal axis, diffuse ST depressions in inferior lateral leads with ST elevation in aVR. This EKG is unchanged from prior from 06/2015 ____________________________________________  RADIOLOGY  Head CT:  Negative____________________________________________   PROCEDURES  Procedure(s) performed: None Procedures Critical Care performed:  None ____________________________________________   INITIAL IMPRESSION / ASSESSMENT AND PLAN / ED COURSE  71 y.o. female with a history of CVA, atrial fibrillation on xarelto, anemia, coronary artery disease who presents for evaluation of hypotension and diarrhea x 3 days and 2 hours of slurred speech and RUE numbness which has now resolved. At this time but do believe patient's symptoms are probably reactivation of old stroke in the setting of hypotension from dehydration from her diarrhea however since patient is within the window for TPA occult stroke was called. Patient's symptoms are currently improving after IV fluids and neurology has signed off. Head CT with no acute stroke. No further recommendation for stroke workup from neurology. We'll continue to give IV fluids. We'll check C. difficile. Labs showing acute on chronic kidney injury with creatinine of 1.60 (baseline is 1.1), given IVF. Hypokalemia with K of 3.1 which was supplemented by mouth. Patient given full ASA. We'll continue to monitor.    _________________________ 2:58 PM on 08/16/2016 -----------------------------------------  Blood pressures improved after 1 L of normal saline. Patient has not taken her rate control agent this morning which will be given to her right now. Heart rate in the 110s. WIll give another 1L of NS. C. Diff is pending. Plan to repeat BMP after 2nd L and if creatinine is improving, vitals stable, and troponin negative plan to dc home. Care transferred to Dr. Mable Paris  Pertinent labs & imaging results  that were  available during my care of the patient were reviewed by me and considered in my medical decision making (see chart for details).    ____________________________________________   FINAL CLINICAL IMPRESSION(S) / ED DIAGNOSES  Final diagnoses:  Diarrhea in adult patient  Dehydration  Hypokalemia  Slurred speech      NEW MEDICATIONS STARTED DURING THIS VISIT:  New Prescriptions   No medications on file     Note:  This document was prepared using Dragon voice recognition software and may include unintentional dictation errors.    Alfred Levins, [place], MD 08/16/16 [phone]

## 2016-08-17 ENCOUNTER — Telehealth: Payer: Self-pay | Admitting: Physician Assistant

## 2016-08-17 DIAGNOSIS — R748 Abnormal levels of other serum enzymes: Secondary | ICD-10-CM | POA: Diagnosis not present

## 2016-08-17 DIAGNOSIS — E876 Hypokalemia: Secondary | ICD-10-CM | POA: Diagnosis not present

## 2016-08-17 DIAGNOSIS — A020 Salmonella enteritis: Secondary | ICD-10-CM | POA: Diagnosis not present

## 2016-08-17 DIAGNOSIS — K529 Noninfective gastroenteritis and colitis, unspecified: Secondary | ICD-10-CM | POA: Diagnosis not present

## 2016-08-17 DIAGNOSIS — N179 Acute kidney failure, unspecified: Secondary | ICD-10-CM | POA: Diagnosis not present

## 2016-08-17 LAB — CBC
HCT: 32.1 % — ABNORMAL LOW (ref 35.0–47.0)
Hemoglobin: 10.3 g/dL — ABNORMAL LOW (ref 12.0–16.0)
MCH: 25.1 pg — ABNORMAL LOW (ref 26.0–34.0)
MCHC: 32.2 g/dL (ref 32.0–36.0)
MCV: 78 fL — ABNORMAL LOW (ref 80.0–100.0)
Platelets: 204 10*3/uL (ref 150–440)
RBC: 4.12 MIL/uL (ref 3.80–5.20)
RDW: 16.5 % — ABNORMAL HIGH (ref 11.5–14.5)
WBC: 5.7 10*3/uL (ref 3.6–11.0)

## 2016-08-17 LAB — BASIC METABOLIC PANEL
Anion gap: 4 — ABNORMAL LOW (ref 5–15)
BUN: 26 mg/dL — ABNORMAL HIGH (ref 6–20)
CO2: 25 mmol/L (ref 22–32)
Calcium: 8.1 mg/dL — ABNORMAL LOW (ref 8.9–10.3)
Chloride: 115 mmol/L — ABNORMAL HIGH (ref 101–111)
Creatinine, Ser: 1.29 mg/dL — ABNORMAL HIGH (ref 0.44–1.00)
GFR calc Af Amer: 47 mL/min — ABNORMAL LOW (ref >60)
GFR calc non Af Amer: 41 mL/min — ABNORMAL LOW (ref >60)
Glucose, Bld: 95 mg/dL (ref 65–99)
Potassium: 3.1 mmol/L — ABNORMAL LOW (ref 3.5–5.1)
Sodium: 144 mmol/L (ref 135–145)

## 2016-08-17 LAB — GLUCOSE, CAPILLARY: Glucose-Capillary: 96 mg/dL (ref 65–99)

## 2016-08-17 MED ORDER — POTASSIUM CHLORIDE CRYS ER 20 MEQ PO TBCR
40.0000 meq | EXTENDED_RELEASE_TABLET | ORAL | Status: AC
  Administered 2016-08-17 (×2): 40 meq via ORAL
  Filled 2016-08-17 (×2): qty 2

## 2016-08-17 MED ORDER — CIPROFLOXACIN HCL 500 MG PO TABS: 500.0000 mg | ORAL_TABLET | Freq: Two times a day (BID) | ORAL | 0 refills | Status: AC

## 2016-08-17 NOTE — Discharge Summary (Signed)
[place] [facility] [place], 71 y.o., DOB [birth year], MRN [id number]. Admission date: 08/16/2016 Discharge Date 08/17/2016 Primary MD Mar Daring, PA-C Admitting Physician Max Sane, MD  Admission Diagnosis  Dehydration [E86.0] Hypokalemia [E87.6] Slurred speech [R47.81] Elevated troponin [R74.8] Diarrhea in adult patient [R19.7]  Discharge Diagnosis   Active Problems:   Salmonella Gastroenteritis    Hypokalemia due to diarrhea   Slurred speech due to hypotension and not stroke   Elevated troponin due to demand ischemia Acute renal failure due to dehydration now resolved Previous history of CVA       Kayla Maxwell  is a 71 y.o. female with a known history of atrial fibrillation on Xarelto and ASA who reports diarrhea, vomiting, fever and fatigue since 8/10. Went to see her PCP today. Developed slurred speech and right arm numbness. Was found to be hypotensive and was sent to the ED for evaluation at that time. Code stroke called on arrival. Dr Vernon Prey from neurology saw her and did not feel any further workup is needed. Patient was noted to have hypokalemia and hypotension due to her diarrhea so she was admitted for IV hydration stool studies did reveal her to have salmonella positive. Patient was started on Cipro. Currently she is doing much better diarrhea is mostly resolved. She is feeling back to baseline and would like to be going home.           Consults   neurology  Significant Tests:  See full reports for all details    Ct Head Code Stroke Wo Contrast  Result Date: 08/16/2016 CLINICAL DATA:  Code stroke. Slurred speech. Diarrhea. Weakness. Last seen well 3 hours ago. EXAM: CT HEAD WITHOUT CONTRAST TECHNIQUE: Contiguous axial images were obtained from the base of the skull through the vertex without intravenous contrast. COMPARISON:  MRI brain 06/07/2015. FINDINGS: Brain: The now remote left  precentral gyrus infarct is noted. No other acute infarct is present. There is no hemorrhage or mass lesion. The basal ganglia are intact. The insular ribbon is normal bilaterally. The brainstem and cerebellum are normal. Vascular: Atherosclerotic calcifications are present bilaterally. There is no hyperdense vessel. Skull: Calvarium is intact. No focal lytic or blastic lesions are present. Sinuses/Orbits: The paranasal sinuses and mastoid air cells are clear. ASPECTS [facility] Stroke Program Early CT Score) - Ganglionic level infarction (caudate, lentiform nuclei, internal capsule, insula, M1-M3 cortex): 7/7 - Supraganglionic infarction (M4-M6 cortex): 3/3 Total score (0-10 with 10 being normal): 10/10 IMPRESSION: 1. No acute intracranial abnormality or significant interval change. 2. Remote posterior left frontal lobe infarct with expected evolution. 3. ASPECTS is 10/10 These results were called by telephone at the time of interpretation on <08/15/16> at <1:02 pm> to Dr. Alfred Levins, who verbally acknowledged these results. Electronically Signed   By: San Morelle M.D.   On: 08/16/2016 13:04       Today   Subjective:   Kayla Maxwell  patient states that she's feeling much better wants to go home her diarrhea is resolved blood pressure normal  Objective:   Blood pressure 118/80, pulse (!) 104, temperature 98.1 F (36.7 C), temperature source Oral, resp. rate 16, height 5\' 5"  (1.651 m), weight 217 lb (98.4 kg), SpO2 96 %.  .  Intake/Output Summary (Last 24 hours) at 08/17/16 1232 Last data filed at 08/17/16 0606  Gross per 24 hour  Intake          2976.67 ml  Output  0 ml  Net          2976.67 ml    Exam VITAL SIGNS: Blood pressure 118/80, pulse (!) 104, temperature 98.1 F (36.7 C), temperature source Oral, resp. rate 16, height 5\' 5"  (1.651 m), weight 217 lb (98.4 kg), SpO2 96 %.  GENERAL:  71 y.o.-year-old patient lying in the bed with no acute distress.  EYES: Pupils  equal, round, reactive to light and accommodation. No scleral icterus. Extraocular muscles intact.  HEENT: Head atraumatic, normocephalic. Oropharynx and nasopharynx clear.  NECK:  Supple, no jugular venous distention. No thyroid enlargement, no tenderness.  LUNGS: Normal breath sounds bilaterally, no wheezing, rales,rhonchi or crepitation. No use of accessory muscles of respiration.  CARDIOVASCULAR: S1, S2 normal. No murmurs, rubs, or gallops.  ABDOMEN: Soft, nontender, nondistended. Bowel sounds present. No organomegaly or mass.  EXTREMITIES: No pedal edema, cyanosis, or clubbing.  NEUROLOGIC: Cranial nerves II through XII are intact. Muscle strength 5/5 in all extremities. Sensation intact. Gait not checked.  PSYCHIATRIC: The patient is alert and oriented x 3.  SKIN: No obvious rash, lesion, or ulcer.   Data Review     CBC w Diff: Lab Results  Component Value Date   WBC 5.7 08/17/2016   HGB 10.3 (L) 08/17/2016   HGB 12.6 06/07/2016   HCT 32.1 (L) 08/17/2016   HCT 38.7 06/07/2016   PLT 204 08/17/2016   PLT 305 06/07/2016   LYMPHOPCT 26.000000 06/06/2015   LYMPHOPCT 15.7 11/10/2013   MONOPCT 6.000000 06/06/2015   MONOPCT 1.1 11/10/2013   EOSPCT 2.000000 06/06/2015   EOSPCT 0.0 11/10/2013   BASOPCT 1.000000 06/06/2015   BASOPCT 0.3 11/10/2013   CMP: Lab Results  Component Value Date   NA 144 08/17/2016   NA 145 (H) 06/07/2016   NA 141 11/10/2013   K 3.1 (L) 08/17/2016   K 4.2 11/10/2013   CL 115 (H) 08/17/2016   CL 112 (H) 11/10/2013   CO2 25 08/17/2016   CO2 24 11/10/2013   BUN 26 (H) 08/17/2016   BUN 22 06/07/2016   BUN 14 11/10/2013   CREATININE 1.29 (H) 08/17/2016   CREATININE 0.73 11/10/2013   PROT 6.8 08/16/2016   PROT 6.4 06/07/2016   PROT 5.8 (L) 04/29/2012   ALBUMIN 3.7 08/16/2016   ALBUMIN 4.2 06/07/2016   ALBUMIN 2.7 (L) 04/29/2012   BILITOT 1.1 08/16/2016   BILITOT 0.5 06/07/2016   BILITOT 0.4 04/29/2012   ALKPHOS 70 08/16/2016   ALKPHOS 91  04/29/2012   AST 29 08/16/2016   AST 44 (H) 04/29/2012   ALT 17 08/16/2016   ALT 64 04/29/2012  .  Micro Results Recent Results (from the past 240 hour(s))  C difficile quick scan w PCR reflex     Status: None   Collection Time: 08/16/16  1:48 PM  Result Value Ref Range Status   C Diff antigen NEGATIVE NEGATIVE Final   C Diff toxin NEGATIVE NEGATIVE Final   C Diff interpretation No C. difficile detected.  Final  Gastrointestinal Panel by PCR , Stool     Status: Abnormal   Collection Time: 08/16/16  1:48 PM  Result Value Ref Range Status   Campylobacter species NOT DETECTED NOT DETECTED Final   Plesimonas shigelloides NOT DETECTED NOT DETECTED Final   Salmonella species DETECTED (A) NOT DETECTED Final    Comment: RESULT CALLED TO, READ BACK BY AND VERIFIED WITH: KARA CAMPBELL RN AT 2035 08/16/16. MSS    Yersinia enterocolitica NOT DETECTED NOT DETECTED Final  Vibrio species NOT DETECTED NOT DETECTED Final   Vibrio cholerae NOT DETECTED NOT DETECTED Final   Enteroaggregative E coli (EAEC) NOT DETECTED NOT DETECTED Final   Enteropathogenic E coli (EPEC) NOT DETECTED NOT DETECTED Final   Enterotoxigenic E coli (ETEC) NOT DETECTED NOT DETECTED Final   Shiga like toxin producing E coli (STEC) NOT DETECTED NOT DETECTED Final   Shigella/Enteroinvasive E coli (EIEC) NOT DETECTED NOT DETECTED Final   Cryptosporidium NOT DETECTED NOT DETECTED Final   Cyclospora cayetanensis NOT DETECTED NOT DETECTED Final   Entamoeba histolytica NOT DETECTED NOT DETECTED Final   Giardia lamblia NOT DETECTED NOT DETECTED Final   Adenovirus F40/41 NOT DETECTED NOT DETECTED Final   Astrovirus NOT DETECTED NOT DETECTED Final   Norovirus GI/GII NOT DETECTED NOT DETECTED Final   Rotavirus A NOT DETECTED NOT DETECTED Final   Sapovirus (I, II, IV, and V) NOT DETECTED NOT DETECTED Final        Code Status Orders        Start     Ordered   08/16/16 1945  Full code  Continuous     08/16/16 1944     Code Status History    Date Active Date Inactive Code Status Order ID Comments User Context   08/12/2015 12:26 PM 08/12/2015  5:10 PM Full Code [id number]  Katha Cabal, MD Inpatient   06/06/2015  1:10 PM 06/08/2015  1:17 PM Full Code [id number]  Vaughan Basta, MD Inpatient   08/19/2014 11:29 AM 08/29/2014  6:25 PM Full Code [id number]  Karie Chimera, MD Inpatient   07/04/2014  1:10 PM 07/05/2014  5:43 PM Full Code [id number]  Karie Chimera, MD Inpatient    Advance Directive Documentation     Most Recent Value  Type of Advance Directive  Healthcare Power of Attorney, Living will  Pre-existing out of facility DNR order (yellow form or pink MOST form)  -  "MOST" Form in Place?  -          Follow-up Information    Mar Daring, PA-C. Go on 08/19/2016.   Specialty:  Family Medicine Why:  Appointment Time: 3:00pm Contact information: [address] [place] [address] [phone]           Discharge Medications   Allergies as of 08/17/2016      Reactions   Codeine Other (See Comments)   Makes her feel "crazy"   Penicillins Swelling   Has patient had a PCN reaction causing immediate rash, facial/tongue/throat swelling, SOB or lightheadedness with hypotension: unknown Has patient had a PCN reaction causing severe rash involving mucus membranes or skin necrosis: unknown Has patient had a PCN reaction that required hospitalization unknown Has patient had a PCN reaction occurring within the last 10 years: no If all of the above answers are "NO", then may proceed with Cephalosporin use.   Adhesive [tape] Hives   Sulfa Antibiotics Nausea Only, Rash      Medication List    STOP taking these medications   furosemide 40 MG tablet Commonly known as:  LASIX     TAKE these medications   albuterol 108 (90 Base) MCG/ACT inhaler Commonly known as:  PROVENTIL HFA;VENTOLIN HFA Inhale 2 puffs into the lungs every 6 (six) hours as needed for wheezing or  shortness of breath.   aspirin 81 MG EC tablet Take 1 tablet (81 mg total) by mouth daily.   atorvastatin 40 MG tablet Commonly known as:  LIPITOR take 1 tablet by mouth once  daily AT 6PM   cetirizine 10 MG tablet Commonly known as:  ZYRTEC Take 10 mg by mouth daily as needed for allergies.   ciprofloxacin 500 MG tablet Commonly known as:  CIPRO Take 1 tablet (500 mg total) by mouth 2 (two) times daily.   esomeprazole 40 MG capsule Commonly known as:  NEXIUM Take 40 mg by mouth daily at 12 noon. Reported on 06/09/2015   ibuprofen 600 MG tablet Commonly known as:  ADVIL,MOTRIN Take 600 mg by mouth every 6 (six) hours as needed for moderate pain.   lisinopril 2.5 MG tablet Commonly known as:  PRINIVIL,ZESTRIL take 1 tablet by mouth once daily   metoprolol tartrate 25 MG tablet Commonly known as:  LOPRESSOR Take 25 mg by mouth 2 (two) times daily. Reported on 06/09/2015   montelukast 10 MG tablet Commonly known as:  SINGULAIR Take 1 tablet (10 mg total) by mouth at bedtime. What changed:  when to take this  reasons to take this   Potassium Chloride ER 20 MEQ Tbcr Take 1 tablet by mouth daily.   rivaroxaban 20 MG Tabs tablet Commonly known as:  XARELTO Take 20 mg by mouth daily with supper.          Total Time in preparing paper work, data evaluation and todays exam - 35 minutes  Dustin Flock M.D on 08/17/2016 at 12:32 PM  [facility]   Office  [phone]

## 2016-08-17 NOTE — Telephone Encounter (Signed)
Pt is being discharged from [facility] today for Gastroenteritis.  I have scheduled a hospital follow up appointment/MW

## 2016-08-17 NOTE — Progress Notes (Signed)
Potassium 3.1; notified MD Marcille Blanco and MD to place orders. Nursing staff will continue to monitor for any changes in patient status. Earleen Reaper, RN

## 2016-08-17 NOTE — Progress Notes (Signed)
A & O. IV and tele removed. Discharge instructions given to pt. Pt has no further concerns at this itm.e

## 2016-08-17 NOTE — Telephone Encounter (Signed)
That's ok! No worries! :-)

## 2016-08-17 NOTE — Discharge Instructions (Signed)
[place] [facility] [place]:  Cardiac diet  DISCHARGE CONDITION:  Stable  ACTIVITY:  Activity as tolerated  OXYGEN:  Home Oxygen: No.   Oxygen Delivery: room air  DISCHARGE LOCATION:  home    ADDITIONAL DISCHARGE INSTRUCTION: dont take lasix for new few days then ok to resume   If you experience worsening of your admission symptoms, develop shortness of breath, life threatening emergency, suicidal or homicidal thoughts you must seek medical attention immediately by calling 911 or calling your MD immediately  if symptoms less severe.  You Must read complete instructions/literature along with all the possible adverse reactions/side effects for all the Medicines you take and that have been prescribed to you. Take any new Medicines after you have completely understood and accpet all the possible adverse reactions/side effects.   Please note  You were cared for by a hospitalist during your hospital stay. If you have any questions about your discharge medications or the care you received while you were in the hospital after you are discharged, you can call the unit and asked to speak with the hospitalist on call if the hospitalist that took care of you is not available. Once you are discharged, your primary care physician will handle any further medical issues. Please note that NO REFILLS for any discharge medications will be authorized once you are discharged, as it is imperative that you return to your primary care physician (or establish a relationship with a primary care physician if you do not have one) for your aftercare needs so that they can reassess your need for medications and monitor your lab values.

## 2016-08-17 NOTE — Care Management (Signed)
No discharge needs identified by members of the care team 

## 2016-08-17 NOTE — Progress Notes (Signed)
Per [facility] infection control, if pt is continent. Pt does not need to be on isolation.

## 2016-08-17 NOTE — Care Management Obs Status (Signed)
[place] NOTIFICATION   Patient Details  Name: Kayla Maxwell MRN: [id number] Date of Birth: 09/06/1945   Medicare Observation Status Notification Given:  No < 24 hours   Katrina Stack, RN 08/17/2016, 11:59 AM

## 2016-08-17 NOTE — Telephone Encounter (Signed)
Pt was an observation after being admitted from ED. We do not do TCM calls on these patients.  Sorry! - MM

## 2016-08-17 NOTE — Telephone Encounter (Signed)
Mckenzie, can you do transition into care call? Thanks!

## 2016-08-20 ENCOUNTER — Inpatient Hospital Stay: Payer: 59 | Admitting: Physician Assistant

## 2016-08-20 ENCOUNTER — Encounter: Payer: Self-pay | Admitting: Physician Assistant

## 2016-08-26 ENCOUNTER — Encounter: Payer: Self-pay | Admitting: Physician Assistant

## 2016-08-26 ENCOUNTER — Ambulatory Visit (INDEPENDENT_AMBULATORY_CARE_PROVIDER_SITE_OTHER): Payer: Medicare Other | Admitting: Physician Assistant

## 2016-08-26 VITALS — BP 118/60 | HR 110 | Temp 98.2°F | Resp 16 | Wt 219.2 lb

## 2016-08-26 DIAGNOSIS — I70213 Atherosclerosis of native arteries of extremities with intermittent claudication, bilateral legs: Secondary | ICD-10-CM | POA: Diagnosis not present

## 2016-08-26 DIAGNOSIS — A020 Salmonella enteritis: Secondary | ICD-10-CM | POA: Diagnosis not present

## 2016-08-26 NOTE — Progress Notes (Signed)
Patient: Kayla Maxwell Female    DOB: 02-19-1945   71 y.o.   MRN: [id number] Visit Date: 08/26/2016  Today's Provider: Mar Daring, PA-C   Chief Complaint  Patient presents with  . Hospitalization Follow-up   Subjective:    HPI  Follow Up Hospital Admission:  Patient is here for ER-Admission follow up.  She was recently seen at [facility] for Salmonella Gastroenteritis on 08/16/16 and Discharge:08/17/2016 Treatment for this included Cipro 500MG  She reports excellent compliance with treatment. Completed Cipro yesterday.  She reports this condition is Improved. ------------------------------------------------------------------------------------     Allergies  Allergen Reactions  . Codeine Other (See Comments)    Makes her feel "crazy"  . Penicillins Swelling    Has patient had a PCN reaction causing immediate rash, facial/tongue/throat swelling, SOB or lightheadedness with hypotension: unknown Has patient had a PCN reaction causing severe rash involving mucus membranes or skin necrosis: unknown Has patient had a PCN reaction that required hospitalization unknown Has patient had a PCN reaction occurring within the last 10 years: no If all of the above answers are "NO", then may proceed with Cephalosporin use.  . Adhesive [Tape] Hives  . Sulfa Antibiotics Nausea Only and Rash     Current Outpatient Prescriptions:  .  albuterol (PROVENTIL HFA;VENTOLIN HFA) 108 (90 Base) MCG/ACT inhaler, Inhale 2 puffs into the lungs every 6 (six) hours as needed for wheezing or shortness of breath., Disp: 1 Inhaler, Rfl: 5 .  aspirin EC 81 MG EC tablet, Take 1 tablet (81 mg total) by mouth daily., Disp: 30 tablet, Rfl: 1 .  atorvastatin (LIPITOR) 40 MG tablet, take 1 tablet by mouth once daily AT 6PM, Disp: 30 tablet, Rfl: 3 .  cetirizine (ZYRTEC) 10 MG tablet, Take 10 mg by mouth daily as needed for allergies. , Disp: , Rfl:  .  esomeprazole (NEXIUM) 40 MG capsule, Take 40 mg by  mouth daily at 12 noon. Reported on 06/09/2015, Disp: , Rfl:  .  ibuprofen (ADVIL,MOTRIN) 600 MG tablet, Take 600 mg by mouth every 6 (six) hours as needed for moderate pain. , Disp: , Rfl:  .  lisinopril (PRINIVIL,ZESTRIL) 2.5 MG tablet, take 1 tablet by mouth once daily, Disp: 30 tablet, Rfl: 5 .  metoprolol tartrate (LOPRESSOR) 25 MG tablet, Take 25 mg by mouth 2 (two) times daily. Reported on 06/09/2015, Disp: , Rfl:  .  montelukast (SINGULAIR) 10 MG tablet, Take 1 tablet (10 mg total) by mouth at bedtime. (Patient taking differently: Take 10 mg by mouth daily as needed (allergies). ), Disp: 30 tablet, Rfl: 3 .  Potassium Chloride ER 20 MEQ TBCR, Take 1 tablet by mouth daily., Disp: 30 tablet, Rfl: 3 .  rivaroxaban (XARELTO) 20 MG TABS tablet, Take 20 mg by mouth daily with supper., Disp: , Rfl:   Review of Systems  Constitutional: Negative for fatigue and fever.  Respiratory: Negative for cough, chest tightness and shortness of breath.   Cardiovascular: Negative for chest pain, palpitations and leg swelling.  Gastrointestinal: Negative for abdominal pain, blood in stool, constipation, diarrhea, nausea and vomiting.  Neurological: Negative for dizziness, light-headedness and headaches.    Social History  Substance Use Topics  . Smoking status: Former Smoker    Packs/day: 1.00    Years: 45.00    Quit date: 08/19/2014  . Smokeless tobacco: Former Systems developer    Quit date: 08/19/2014  . Alcohol use No     Comment: Rarely   Objective:  BP 118/60 (BP Location: Right Arm, Patient Position: Sitting, Cuff Size: Normal)   Pulse (!) 110   Temp 98.2 F (36.8 C) (Oral)   Resp 16   Wt 219 lb 3.2 oz (99.4 kg)   SpO2 97%   BMI 36.48 kg/m    Physical Exam  Constitutional: She is oriented to person, place, and time. She appears well-developed and well-nourished. No distress.  Cardiovascular: Normal rate, regular rhythm and normal heart sounds.  Exam reveals no gallop and no friction rub.   No murmur  heard. Pulmonary/Chest: Effort normal and breath sounds normal. No respiratory distress. She has no wheezes. She has no rales.  Abdominal: Soft. Normal appearance and bowel sounds are normal. She exhibits no distension and no mass. There is no hepatosplenomegaly. There is no tenderness. There is no rebound, no guarding and no CVA tenderness.  Neurological: She is alert and oriented to person, place, and time.  Skin: Skin is warm and dry. She is not diaphoretic.  Vitals reviewed.       Assessment & Plan:      1. Salmonella gastroenteritis Improved. Completed Cipro. Will check labs as below for routine to baseline. I will f/u pending lab results. - CBC with Differential - Basic Metabolic Panel (BMET)       Mar Daring, PA-C  [facility]

## 2016-08-27 ENCOUNTER — Telehealth: Payer: Self-pay

## 2016-08-27 LAB — CBC WITH DIFFERENTIAL/PLATELET
Basophils Absolute: 0 10*3/uL (ref 0.0–0.2)
Basos: 0 %
EOS (ABSOLUTE): 0.1 10*3/uL (ref 0.0–0.4)
Eos: 1 %
Hematocrit: 39.7 % (ref 34.0–46.6)
Hemoglobin: 12.1 g/dL (ref 11.1–15.9)
Immature Grans (Abs): 0 10*3/uL (ref 0.0–0.1)
Immature Granulocytes: 0 %
Lymphocytes Absolute: 2.4 10*3/uL (ref 0.7–3.1)
Lymphs: 24 %
MCH: 24.8 pg — ABNORMAL LOW (ref 26.6–33.0)
MCHC: 30.5 g/dL — ABNORMAL LOW (ref 31.5–35.7)
MCV: 81 fL (ref 79–97)
Monocytes Absolute: 0.7 10*3/uL (ref 0.1–0.9)
Monocytes: 7 %
Neutrophils Absolute: 6.9 10*3/uL (ref 1.4–7.0)
Neutrophils: 68 %
Platelets: 358 10*3/uL (ref 150–379)
RBC: 4.88 x10E6/uL (ref 3.77–5.28)
RDW: 16 % — ABNORMAL HIGH (ref 12.3–15.4)
WBC: 10.2 10*3/uL (ref 3.4–10.8)

## 2016-08-27 LAB — BASIC METABOLIC PANEL
BUN/Creatinine Ratio: 12 (ref 12–28)
BUN: 14 mg/dL (ref 8–27)
CO2: 25 mmol/L (ref 20–29)
Calcium: 9.3 mg/dL (ref 8.7–10.3)
Chloride: 102 mmol/L (ref 96–106)
Creatinine, Ser: 1.17 mg/dL — ABNORMAL HIGH (ref 0.57–1.00)
GFR calc Af Amer: 54 mL/min/{1.73_m2} — ABNORMAL LOW (ref >59)
GFR calc non Af Amer: 47 mL/min/{1.73_m2} — ABNORMAL LOW (ref >59)
Glucose: 114 mg/dL — ABNORMAL HIGH (ref 65–99)
Potassium: 5.4 mmol/L — ABNORMAL HIGH (ref 3.5–5.2)
Sodium: 141 mmol/L (ref 134–144)

## 2016-08-27 NOTE — Telephone Encounter (Signed)
-----   Message from Mar Daring, PA-C sent at 08/27/2016 10:14 AM EDT ----- Labs have continued to improve. Continue hydration. Ok to start furosemide today. Potassium borderline high so hold potassium supplement x 1 day. Then restart with furosemide.

## 2016-08-27 NOTE — Telephone Encounter (Signed)
LMTCB  Thanks,  -Joseline 

## 2016-08-30 ENCOUNTER — Telehealth: Payer: Self-pay | Admitting: Physician Assistant

## 2016-08-30 NOTE — Telephone Encounter (Signed)
Pt is requesting results of blood work °

## 2016-08-30 NOTE — Telephone Encounter (Signed)
Patient advised as below. Patient verbalizes understanding and is in agreement with treatment plan.  

## 2016-09-13 ENCOUNTER — Ambulatory Visit: Payer: Medicare Other | Admitting: Physician Assistant

## 2016-10-19 ENCOUNTER — Telehealth: Payer: Self-pay | Admitting: Physician Assistant

## 2016-10-19 NOTE — Telephone Encounter (Signed)
Patient reports that if her letter can state that due to her medical condition she cannot be on jury duty and any other medical condition that can help her.  Thanks,  -Shayna Eblen

## 2016-10-19 NOTE — Telephone Encounter (Signed)
Pt called [facility] (because our phones are down) and is requesting a letter for jury duty. Pt request a call back. Please advise. Thanks TNP

## 2016-10-20 ENCOUNTER — Encounter: Payer: Self-pay | Admitting: Physician Assistant

## 2016-10-20 NOTE — Telephone Encounter (Signed)
Patient advised.

## 2016-10-20 NOTE — Telephone Encounter (Signed)
Letter printed for patient. 

## 2016-10-22 ENCOUNTER — Other Ambulatory Visit: Payer: Self-pay | Admitting: Family Medicine

## 2016-10-22 DIAGNOSIS — I63232 Cerebral infarction due to unspecified occlusion or stenosis of left carotid arteries: Secondary | ICD-10-CM

## 2016-11-01 DIAGNOSIS — R0602 Shortness of breath: Secondary | ICD-10-CM | POA: Insufficient documentation

## 2016-11-01 DIAGNOSIS — G4733 Obstructive sleep apnea (adult) (pediatric): Secondary | ICD-10-CM | POA: Diagnosis not present

## 2016-11-01 DIAGNOSIS — I10 Essential (primary) hypertension: Secondary | ICD-10-CM | POA: Diagnosis not present

## 2016-11-01 DIAGNOSIS — I482 Chronic atrial fibrillation: Secondary | ICD-10-CM | POA: Diagnosis not present

## 2016-11-11 DIAGNOSIS — I482 Chronic atrial fibrillation: Secondary | ICD-10-CM | POA: Diagnosis not present

## 2016-11-11 DIAGNOSIS — R0602 Shortness of breath: Secondary | ICD-10-CM | POA: Diagnosis not present

## 2016-11-11 DIAGNOSIS — I340 Nonrheumatic mitral (valve) insufficiency: Secondary | ICD-10-CM | POA: Diagnosis not present

## 2016-11-11 DIAGNOSIS — I10 Essential (primary) hypertension: Secondary | ICD-10-CM | POA: Diagnosis not present

## 2016-11-11 DIAGNOSIS — G4733 Obstructive sleep apnea (adult) (pediatric): Secondary | ICD-10-CM | POA: Diagnosis not present

## 2016-11-23 DIAGNOSIS — I482 Chronic atrial fibrillation: Secondary | ICD-10-CM | POA: Diagnosis not present

## 2016-11-30 ENCOUNTER — Other Ambulatory Visit: Payer: Self-pay | Admitting: Physician Assistant

## 2016-11-30 DIAGNOSIS — F320 Major depressive disorder, single episode, mild: Secondary | ICD-10-CM

## 2016-11-30 MED ORDER — ESCITALOPRAM OXALATE 10 MG PO TABS: 10.0000 mg | ORAL_TABLET | Freq: Every day | ORAL | 3 refills | Status: DC

## 2016-11-30 NOTE — Telephone Encounter (Signed)
Pt contacted office for refill request on the following medications:  escitalopram (LEXAPRO) 10 MG tablet  [facility]  Pt stated that she would like to taking the medication again. Please advise. Thanks TNP

## 2016-11-30 NOTE — Telephone Encounter (Signed)
Will send in. Would recommend f/u in 4-6 weeks to make sure she is doing better.

## 2016-11-30 NOTE — Telephone Encounter (Signed)
Please Review.  Thanks,  -Joseline 

## 2016-11-30 NOTE — Telephone Encounter (Signed)
Patient advised as below. Patient verbalizes understanding and is in agreement with treatment plan. Patient reports she will call back to schedule appointment. sd

## 2016-12-03 DIAGNOSIS — I482 Chronic atrial fibrillation: Secondary | ICD-10-CM | POA: Diagnosis not present

## 2016-12-10 DIAGNOSIS — I482 Chronic atrial fibrillation: Secondary | ICD-10-CM | POA: Diagnosis not present

## 2016-12-10 DIAGNOSIS — I071 Rheumatic tricuspid insufficiency: Secondary | ICD-10-CM | POA: Diagnosis not present

## 2016-12-10 DIAGNOSIS — I340 Nonrheumatic mitral (valve) insufficiency: Secondary | ICD-10-CM | POA: Diagnosis not present

## 2016-12-10 DIAGNOSIS — I63232 Cerebral infarction due to unspecified occlusion or stenosis of left carotid arteries: Secondary | ICD-10-CM | POA: Diagnosis not present

## 2016-12-10 DIAGNOSIS — I10 Essential (primary) hypertension: Secondary | ICD-10-CM | POA: Diagnosis not present

## 2016-12-10 DIAGNOSIS — E782 Mixed hyperlipidemia: Secondary | ICD-10-CM | POA: Diagnosis not present

## 2017-01-05 ENCOUNTER — Other Ambulatory Visit: Payer: Self-pay | Admitting: Physician Assistant

## 2017-01-05 DIAGNOSIS — Z8679 Personal history of other diseases of the circulatory system: Secondary | ICD-10-CM

## 2017-01-19 ENCOUNTER — Ambulatory Visit (INDEPENDENT_AMBULATORY_CARE_PROVIDER_SITE_OTHER): Payer: Medicare Other | Admitting: Vascular Surgery

## 2017-01-19 ENCOUNTER — Encounter (INDEPENDENT_AMBULATORY_CARE_PROVIDER_SITE_OTHER): Payer: Medicare Other

## 2017-02-16 ENCOUNTER — Ambulatory Visit (INDEPENDENT_AMBULATORY_CARE_PROVIDER_SITE_OTHER): Payer: Medicare Other

## 2017-02-16 ENCOUNTER — Encounter (INDEPENDENT_AMBULATORY_CARE_PROVIDER_SITE_OTHER): Payer: Self-pay | Admitting: Vascular Surgery

## 2017-02-16 ENCOUNTER — Ambulatory Visit (INDEPENDENT_AMBULATORY_CARE_PROVIDER_SITE_OTHER): Payer: Medicare Other | Admitting: Vascular Surgery

## 2017-02-16 VITALS — BP 112/57 | HR 81 | Resp 16 | Ht 65.0 in | Wt 216.8 lb

## 2017-02-16 DIAGNOSIS — R609 Edema, unspecified: Secondary | ICD-10-CM | POA: Diagnosis not present

## 2017-02-16 DIAGNOSIS — E785 Hyperlipidemia, unspecified: Secondary | ICD-10-CM

## 2017-02-16 DIAGNOSIS — I739 Peripheral vascular disease, unspecified: Secondary | ICD-10-CM

## 2017-02-16 NOTE — Progress Notes (Signed)
Subjective:    Patient ID: Kayla Maxwell, female    DOB: 01/07/1945, 72 y.o.   MRN: [id number] Chief Complaint  Patient presents with  . Follow-up    48yr abi,aorta iliac   Patient presents for a yearly peripheral artery disease follow-up.  The patient presents today without complaint.  The patient denies any claudication-like symptoms, rest pain or ulceration to the bilateral lower extremity.  The patient underwent an aortic iliac which was notable for biphasic waveforms through the bilateral iliac arteries.  No evidence of significant stenosis noted in the bilateral aortoiliac arterial system.  The patient also underwent a bilateral ABI which was notable for biphasic blood flow bilaterally.  No evidence of lower extremity arterial disease.  The toe brachial indices are normal.  The patient denies any fever, nausea vomiting.   Review of Systems  Constitutional: Negative.   HENT: Negative.   Eyes: Negative.   Respiratory: Negative.   Cardiovascular: Negative.   Gastrointestinal: Negative.   Endocrine: Negative.   Genitourinary: Negative.   Musculoskeletal: Negative.   Skin: Negative.   Allergic/Immunologic: Negative.   Neurological: Negative.   Hematological: Negative.   Psychiatric/Behavioral: Negative.       Objective:   Physical Exam  Constitutional: She is oriented to person, place, and time. She appears well-developed and well-nourished. No distress.  HENT:  Head: Normocephalic and atraumatic.  Eyes: Conjunctivae are normal. Pupils are equal, round, and reactive to light.  Neck: Normal range of motion.  Cardiovascular: Normal rate, regular rhythm, normal heart sounds and intact distal pulses.  Pulses:      Radial pulses are 2+ on the right side, and 2+ on the left side.       Dorsalis pedis pulses are 2+ on the right side, and 2+ on the left side.       Posterior tibial pulses are 2+ on the right side, and 2+ on the left side.  Pulmonary/Chest: Effort normal and breath  sounds normal.  Musculoskeletal: Normal range of motion. She exhibits edema (Mild bilateral lower extremity edema noted).  Neurological: She is alert and oriented to person, place, and time.  Skin: Skin is warm and dry. She is not diaphoretic.  Psychiatric: She has a normal mood and affect. Her behavior is normal. Judgment and thought content normal.  Vitals reviewed.  BP (!) 112/57 (BP Location: Right Arm)   Pulse 81   Resp 16   Ht 5\' 5"  (1.651 m)   Wt 216 lb 12.8 oz (98.3 kg)   BMI 36.08 kg/m   Past Medical History:  Diagnosis Date  . Acid reflux    takes Nexium daily  . Allergy    takes Singulair daily as needed  . Anemia   . Atrial fibrillation ([place])   . Cancer ([place]) AGE 1   CERVICAL cancer with removal  . Chronic back pain    stenosis  . Complication of anesthesia   . Constipation    takes Colace daily as needed  . Coronary artery disease   . Difficult intubation   . Dysrhythmia    atrial fibrillation dx 04/2014  . Headache    sinus   . History of blood transfusion   . History of shingles   . Hyperlipidemia    takes Pravastatin daily-started taking this 6 months ago  . Lumbar surgical wound fluid collection   . Sleep apnea    CPAP  . Stroke ([place])    TIA'S X 2 --- one last yr and  one this yr...lost her memory over 4-6 hrs.  . Stroke ([place]) 06-04-15   left with right side weakness  . TIA (transient ischemic attack)    showed up on an MRI but she never knew it  . Urinary frequency    Social History   Socioeconomic History  . Marital status: Divorced    Spouse name: Not on file  . Number of children: 1  . Years of education: College  . Highest education level: Not on file  Social Needs  . Financial resource strain: Not on file  . Food insecurity - worry: Not on file  . Food insecurity - inability: Not on file  . Transportation needs - medical: Not on file  . Transportation needs - non-medical: Not on file  Occupational History  . Occupation: Retired    Tobacco Use  . Smoking status: Former Smoker    Packs/day: 1.00    Years: 45.00    Pack years: 45.00    Last attempt to quit: 08/19/2014    Years since quitting: 2.4  . Smokeless tobacco: Former Systems developer    Quit date: 08/19/2014  Substance and Sexual Activity  . Alcohol use: No    Comment: Rarely  . Drug use: No  . Sexual activity: Not on file  Other Topics Concern  . Not on file  Social History Narrative  . Not on file   Past Surgical History:  Procedure Laterality Date  . ABDOMINAL HYSTERECTOMY    . APPENDECTOMY  1963  . BACK SURGERY  08/19/2014   Dr. Hal Neer ([place] [facility])  . BREAST SURGERY  1984   augementation  . [place]  . CHOLECYSTECTOMY  2014   Dr Jamal Collin  . COLONOSCOPY    . ESOPHAGOGASTRODUODENOSCOPY    . HAMMER TOE SURGERY Bilateral   . LUMBAR LAMINECTOMY WITH COFLEX 1 LEVEL Bilateral 07/04/2014   Procedure: Laminectomy and Foraminotomy - bilateral - Lumbar Four-Five with coflex ;  Surgeon: Karie Chimera, MD;  Location: [facility];  Service: Neurosurgery;  Laterality: Bilateral;  . LUMBAR WOUND DEBRIDEMENT N/A 08/19/2014   Procedure: Exploration of Lumbar wound;  Surgeon: Karie Chimera, MD;  Location: [facility];  Service: Neurosurgery;  Laterality: N/A;  . PERIPHERAL VASCULAR CATHETERIZATION Left 08/12/2015   Procedure: Lower Extremity Angiography;  Surgeon: Katha Cabal, MD;  Location: [facility];  Service: Cardiovascular;  Laterality: Left;  . TONSILLECTOMY    . VENTRAL HERNIA REPAIR N/A 05/26/2015   Procedure: HERNIA REPAIR VENTRAL ADULT;  Surgeon: Christene Lye, MD;  Location: [facility] ORS;  Service: General;  Laterality: N/A;   Family History  Problem Relation Age of Onset  . Alzheimer's disease Mother   . Heart attack Father   . Pancreatic cancer Sister   . Healthy Brother    Allergies  Allergen Reactions  . Codeine Other (See Comments)    Makes her feel "crazy"  . Penicillins Swelling    Has patient had a PCN  reaction causing immediate rash, facial/tongue/throat swelling, SOB or lightheadedness with hypotension: unknown Has patient had a PCN reaction causing severe rash involving mucus membranes or skin necrosis: unknown Has patient had a PCN reaction that required hospitalization unknown Has patient had a PCN reaction occurring within the last 10 years: no If all of the above answers are "NO", then may proceed with Cephalosporin use.  . Adhesive [Tape] Hives  . Sulfa Antibiotics Nausea Only and Rash      Assessment & Plan:  Patient  presents for a yearly peripheral artery disease follow-up.  The patient presents today without complaint.  The patient denies any claudication-like symptoms, rest pain or ulceration to the bilateral lower extremity.  The patient underwent an aortic iliac which was notable for biphasic waveforms through the bilateral iliac arteries.  No evidence of significant stenosis noted in the bilateral aortoiliac arterial system.  The patient also underwent a bilateral ABI which was notable for biphasic blood flow bilaterally.  No evidence of lower extremity arterial disease.  The toe brachial indices are normal.  The patient denies any fever, nausea vomiting.  1. PAD (peripheral artery disease) ([place]) - Stable Studies reviewed with patient. Patient is asymptomatic.  Aortoiliac and ABI are stable.  No significant lower extremity arterial disease on today's duplex is Patient to follow up in one year. Patient to remain abstinent of tobacco use. I have discussed with the patient at length the risk factors for and pathogenesis of atherosclerotic disease and encouraged a healthy diet, regular exercise regimen and blood pressure / glucose control.  The patient was encouraged to call the office in the interim if he experiences any claudication like symptoms, rest pain or ulcers to his feet / toes.  - VAS [place] ABI WITH/WO TBI; Future - VAS US AORTA/IVC/ILIACS; Future  2. Hyperlipidemia,  unspecified hyperlipidemia type - Stable Encouraged good control as its slows the progression of atherosclerotic disease  3. Edema, unspecified type - Stable The patient was encouraged to wear graduated compression stockings (20-30 mmHg) on a daily basis. The patient was instructed to begin wearing the stockings first thing in the morning and removing them in the evening. The patient was instructed specifically not to sleep in the stockings. Prescription In addition, behavioral modification including elevation during the day will be initiated. Anti-inflammatories for pain. We discussed moving forward with a venous ultrasound.  At this time, the patient is not interested in moving forward with this The patient was instructed to call the office in the interim if any worsening edema or ulcerations to the legs, feet or toes occurs. The patient expresses their understanding.  Current Outpatient Medications on File Prior to Visit  Medication Sig Dispense Refill  . albuterol (PROVENTIL HFA;VENTOLIN HFA) 108 (90 Base) MCG/ACT inhaler Inhale 2 puffs into the lungs every 6 (six) hours as needed for wheezing or shortness of breath. 1 Inhaler 5  . aspirin EC 81 MG EC tablet Take 1 tablet (81 mg total) by mouth daily. 30 tablet 1  . atorvastatin (LIPITOR) 40 MG tablet TAKE 1 TABLET BY MOUTH EVERY EVENING AT 6 PM 30 tablet 12  . cetirizine (ZYRTEC) 10 MG tablet Take 10 mg by mouth daily as needed for allergies.     Marland Kitchen escitalopram (LEXAPRO) 10 MG tablet Take 1 tablet (10 mg total) by mouth daily. 30 tablet 3  . esomeprazole (NEXIUM) 40 MG capsule Take 40 mg by mouth daily at 12 noon. Reported on 06/09/2015    . furosemide (LASIX) 40 MG tablet TAKE 1 TABLET BY MOUTH ONCE DAILY 90 tablet 1  . ibuprofen (ADVIL,MOTRIN) 600 MG tablet Take 600 mg by mouth every 6 (six) hours as needed for moderate pain.     Marland Kitchen lisinopril (PRINIVIL,ZESTRIL) 2.5 MG tablet TAKE 1 TABLET BY MOUTH ONCE DAILY 90 tablet 1  . metoprolol  tartrate (LOPRESSOR) 25 MG tablet Take 25 mg by mouth 2 (two) times daily. Reported on 06/09/2015    . montelukast (SINGULAIR) 10 MG tablet Take 1 tablet (10 mg total)  by mouth at bedtime. (Patient taking differently: Take 10 mg by mouth daily as needed (allergies). ) 30 tablet 3  . Potassium Chloride ER 20 MEQ TBCR Take 1 tablet by mouth daily. 30 tablet 3  . potassium chloride SA (K-DUR,KLOR-CON) 20 MEQ tablet TAKE 1 TABLET BY MOUTH ONCE DAILY 90 tablet 1  . rivaroxaban (XARELTO) 20 MG TABS tablet Take 20 mg by mouth daily with supper.     No current facility-administered medications on file prior to visit.    There are no Patient Instructions on file for this visit. No Follow-up on file.  Atthew Coutant A Shyloh Krinke, PA-C

## 2017-02-21 ENCOUNTER — Telehealth: Payer: Self-pay | Admitting: Physician Assistant

## 2017-02-21 DIAGNOSIS — F320 Major depressive disorder, single episode, mild: Secondary | ICD-10-CM

## 2017-02-21 MED ORDER — FLUOXETINE HCL 20 MG PO TABS: 20.0000 mg | ORAL_TABLET | Freq: Every day | ORAL | 3 refills | Status: DC

## 2017-02-21 NOTE — Telephone Encounter (Signed)
[facility]oxetine [facility] ti [facility]

## 2017-02-21 NOTE — Telephone Encounter (Signed)
Please review. KW 

## 2017-02-21 NOTE — Telephone Encounter (Signed)
Pt states the Escitalopram 10 MG is $47 a month and she is wanting to know if she could take Fluoxtine instead.  States the pharmacy told her it was comparable.  States it will be cheaper for her.  States she would like a call back and if okay to have next [employer] [facility] [place].    **Note she has changed her Pharmacy.

## 2017-02-21 NOTE — Telephone Encounter (Signed)
LM as directed below.  Thanks,  -Yasmeen Manka 

## 2017-03-04 ENCOUNTER — Telehealth: Payer: Self-pay

## 2017-03-04 NOTE — Telephone Encounter (Signed)
Pt scheduled AWV for 03/09/17 @ 3 PM.

## 2017-03-04 NOTE — Telephone Encounter (Signed)
LMTCB and schedule AWV and pt states shes going to check with her daughter first (transportation) and will CB. -MM

## 2017-03-09 ENCOUNTER — Ambulatory Visit (INDEPENDENT_AMBULATORY_CARE_PROVIDER_SITE_OTHER): Payer: Medicare Other

## 2017-03-09 VITALS — BP 124/77 | HR 96 | Temp 98.7°F | Ht 65.0 in | Wt 221.6 lb

## 2017-03-09 DIAGNOSIS — Z0000 Encounter for general adult medical examination without abnormal findings: Secondary | ICD-10-CM

## 2017-03-09 NOTE — Patient Instructions (Signed)
Kayla Maxwell , Thank you for taking time to come for your Medicare Wellness Visit. I appreciate your ongoing commitment to your health goals. Please review the following plan we discussed and let me know if I can assist you in the future.   Screening recommendations/referrals: Colonoscopy: Up to date Mammogram: Up to date Bone Density: Up to date Recommended yearly ophthalmology/optometry visit for glaucoma screening and checkup Recommended yearly dental visit for hygiene and checkup  Vaccinations: Influenza vaccine: Pt declines today.  Pneumococcal vaccine: Pt declines Prevnar 13 today. Tdap vaccine: Pt declines today.  Shingles vaccine: Pt declines today.     Advanced directives: Already on file.  Conditions/risks identified: Obesity- recommend 3 small meals a day with 2 healthy snacks in between.   Next appointment: 04/04/17 @ 3:00 PM with Kayla Maxwell.   Preventive Care 43 Years and Older, Female Preventive care refers to lifestyle choices and visits with your health care provider that can promote health and wellness. What does preventive care include?  A yearly physical exam. This is also called an annual well check.  Dental exams once or twice a year.  Routine eye exams. Ask your health care provider how often you should have your eyes checked.  Personal lifestyle choices, including:  Daily care of your teeth and gums.  Regular physical activity.  Eating a healthy diet.  Avoiding tobacco and drug use.  Limiting alcohol use.  Practicing safe sex.  Taking low-dose aspirin every day.  Taking vitamin and mineral supplements as recommended by your health care provider. What happens during an annual well check? The services and screenings done by your health care provider during your annual well check will depend on your age, overall health, lifestyle risk factors, and family history of disease. Counseling  Your health care provider may ask you questions about  your:  Alcohol use.  Tobacco use.  Drug use.  Emotional well-being.  Home and relationship well-being.  Sexual activity.  Eating habits.  History of falls.  Memory and ability to understand (cognition).  Work and work Statistician.  Reproductive health. Screening  You may have the following tests or measurements:  Height, weight, and BMI.  Blood pressure.  Lipid and cholesterol levels. These may be checked every 5 years, or more frequently if you are over 35 years old.  Skin check.  Lung cancer screening. You may have this screening every year starting at age 65 if you have a 30-pack-year history of smoking and currently smoke or have quit within the past 15 years.  Fecal occult blood test (FOBT) of the stool. You may have this test every year starting at age 49.  Flexible sigmoidoscopy or colonoscopy. You may have a sigmoidoscopy every 5 years or a colonoscopy every 10 years starting at age 75.  Hepatitis C blood test.  Hepatitis B blood test.  Sexually transmitted disease (STD) testing.  Diabetes screening. This is done by checking your blood sugar (glucose) after you have not eaten for a while (fasting). You may have this done every 1-3 years.  Bone density scan. This is done to screen for osteoporosis. You may have this done starting at age 63.  Mammogram. This may be done every 1-2 years. Talk to your health care provider about how often you should have regular mammograms. Talk with your health care provider about your test results, treatment options, and if necessary, the need for more tests. Vaccines  Your health care provider may recommend certain vaccines, such as:  Influenza vaccine.  This is recommended every year.  Tetanus, diphtheria, and acellular pertussis (Tdap, Td) vaccine. You may need a Td booster every 10 years.  Zoster vaccine. You may need this after age 68.  Pneumococcal 13-valent conjugate (PCV13) vaccine. One dose is recommended  after age 76.  Pneumococcal polysaccharide (PPSV23) vaccine. One dose is recommended after age 20. Talk to your health care provider about which screenings and vaccines you need and how often you need them. This information is not intended to replace advice given to you by your health care provider. Make sure you discuss any questions you have with your health care provider. Document Released: 01/17/2015 Document Revised: 09/10/2015 Document Reviewed: 10/22/2014 Elsevier Interactive Patient Education  2017 [place] Prevention in the Home Falls can cause injuries. They can happen to people of all ages. There are many things you can do to make your home safe and to help prevent falls. What can I do on the outside of my home?  Regularly fix the edges of walkways and driveways and fix any cracks.  Remove anything that might make you trip as you walk through a door, such as a raised step or threshold.  Trim any bushes or trees on the path to your home.  Use bright outdoor lighting.  Clear any walking paths of anything that might make someone trip, such as rocks or tools.  Regularly check to see if handrails are loose or broken. Make sure that both sides of any steps have handrails.  Any raised decks and porches should have guardrails on the edges.  Have any leaves, snow, or ice cleared regularly.  Use sand or salt on walking paths during winter.  Clean up any spills in your garage right away. This includes oil or grease spills. What can I do in the bathroom?  Use night lights.  Install grab bars by the toilet and in the tub and shower. Do not use towel bars as grab bars.  Use non-skid mats or decals in the tub or shower.  If you need to sit down in the shower, use a plastic, non-slip stool.  Keep the floor dry. Clean up any water that spills on the floor as soon as it happens.  Remove soap buildup in the tub or shower regularly.  Attach bath mats securely with  double-sided non-slip rug tape.  Do not have throw rugs and other things on the floor that can make you trip. What can I do in the bedroom?  Use night lights.  Make sure that you have a light by your bed that is easy to reach.  Do not use any sheets or blankets that are too big for your bed. They should not hang down onto the floor.  Have a firm chair that has side arms. You can use this for support while you get dressed.  Do not have throw rugs and other things on the floor that can make you trip. What can I do in the kitchen?  Clean up any spills right away.  Avoid walking on wet floors.  Keep items that you use a lot in easy-to-reach places.  If you need to reach something above you, use a strong step stool that has a grab bar.  Keep electrical cords out of the way.  Do not use floor polish or wax that makes floors slippery. If you must use wax, use non-skid floor wax.  Do not have throw rugs and other things on the floor that can make  you trip. What can I do with my stairs?  Do not leave any items on the stairs.  Make sure that there are handrails on both sides of the stairs and use them. Fix handrails that are broken or loose. Make sure that handrails are as long as the stairways.  Check any carpeting to make sure that it is firmly attached to the stairs. Fix any carpet that is loose or worn.  Avoid having throw rugs at the top or bottom of the stairs. If you do have throw rugs, attach them to the floor with carpet tape.  Make sure that you have a light switch at the top of the stairs and the bottom of the stairs. If you do not have them, ask someone to add them for you. What else can I do to help prevent falls?  Wear shoes that:  Do not have high heels.  Have rubber bottoms.  Are comfortable and fit you well.  Are closed at the toe. Do not wear sandals.  If you use a stepladder:  Make sure that it is fully opened. Do not climb a closed stepladder.  Make  sure that both sides of the stepladder are locked into place.  Ask someone to hold it for you, if possible.  Clearly mark and make sure that you can see:  Any grab bars or handrails.  First and last steps.  Where the edge of each step is.  Use tools that help you move around (mobility aids) if they are needed. These include:  Canes.  Walkers.  Scooters.  Crutches.  Turn on the lights when you go into a dark area. Replace any light bulbs as soon as they burn out.  Set up your furniture so you have a clear path. Avoid moving your furniture around.  If any of your floors are uneven, fix them.  If there are any pets around you, be aware of where they are.  Review your medicines with your doctor. Some medicines can make you feel dizzy. This can increase your chance of falling. Ask your doctor what other things that you can do to help prevent falls. This information is not intended to replace advice given to you by your health care provider. Make sure you discuss any questions you have with your health care provider. Document Released: 10/17/2008 Document Revised: 05/29/2015 Document Reviewed: 01/25/2014 Elsevier Interactive Patient Education  2017 [employer].

## 2017-03-09 NOTE — Progress Notes (Signed)
Subjective:   Kayla Maxwell is a 72 y.o. female who presents for Medicare Annual (Subsequent) preventive examination.  Review of Systems:  N/A  Cardiac Risk Factors include: advanced age (>43men, >2 women);dyslipidemia;obesity (BMI >30kg/m2);hypertension     Objective:     Vitals: BP 124/77 (BP Location: Left Arm)   Pulse 96   Temp 98.7 F (37.1 C) (Oral)   Ht 5\' 5"  (1.651 m)   Wt 221 lb 9.6 oz (100.5 kg)   BMI 36.88 kg/m   Body mass index is 36.88 kg/m.  Advanced Directives 03/09/2017 08/16/2016 08/16/2016 01/14/2016 08/12/2015 06/09/2015 06/06/2015  Does Patient Have a Medical Advance Directive? Yes Yes Yes Yes Yes Yes Yes  Type of Paramedic of [place];Living will [place];Living will [place];Living will Living will;Healthcare Power of Attorney - Living will;Healthcare Power of Attorney -  Does patient want to make changes to medical advance directive? - No - Patient declined - - - - -  Copy of [place] in Chart? Yes Yes Yes - No - copy requested - -  Would patient like information on creating a medical advance directive? - - - - - - -    Tobacco Social History   Tobacco Use  Smoking Status Former Smoker  . Packs/day: 1.00  . Years: 45.00  . Pack years: 45.00  . Last attempt to quit: 08/19/2014  . Years since quitting: 2.5  Smokeless Tobacco Former Systems developer  . Quit date: 08/19/2014     Counseling given: Not Answered   Clinical Intake:  Pre-visit preparation completed: Yes  Pain : 0-10 Pain Score: 4  Pain Type: Chronic pain Pain Location: Foot Pain Orientation: Right, Left Pain Descriptors / Indicators: Crushing, Squeezing Pain Frequency: Constant     Nutritional Status: BMI > 30  Obese Nutritional Risks: None Diabetes: No  How often do you need to have someone help you when you read instructions, pamphlets, or other written materials from your doctor or pharmacy?: 1 -  Never  Interpreter Needed?: No  Information entered by :: [facility], LPN  Past Medical History:  Diagnosis Date  . Acid reflux    takes Nexium daily  . Allergy    takes Singulair daily as needed  . Anemia   . Atrial fibrillation ([place])   . Cancer ([place]) AGE 35   CERVICAL cancer with removal  . Chronic back pain    stenosis  . Complication of anesthesia   . Constipation    takes Colace daily as needed  . Coronary artery disease   . Difficult intubation   . Dysrhythmia    atrial fibrillation dx 04/2014  . Headache    sinus   . History of blood transfusion   . History of shingles   . Hyperlipidemia    takes Pravastatin daily-started taking this 6 months ago  . Lumbar surgical wound fluid collection   . Sleep apnea    CPAP  . Stroke ([place])    TIA'S X 2 --- one last yr and one this yr...lost her memory over 4-6 hrs.  . Stroke ([place]) 06-04-15   left with right side weakness  . TIA (transient ischemic attack)    showed up on an MRI but she never knew it  . Urinary frequency    Past Surgical History:  Procedure Laterality Date  . ABDOMINAL HYSTERECTOMY    . APPENDECTOMY  1963  . BACK SURGERY  08/19/2014   Dr. Hal Neer ([place])  . BREAST SURGERY  1984   augementation  . [place]  . CHOLECYSTECTOMY  2014   Dr Jamal Collin  . COLONOSCOPY    . ESOPHAGOGASTRODUODENOSCOPY    . HAMMER TOE SURGERY Bilateral   . LUMBAR LAMINECTOMY WITH COFLEX 1 LEVEL Bilateral 07/04/2014   Procedure: Laminectomy and Foraminotomy - bilateral - Lumbar Four-Five with coflex ;  Surgeon: Karie Chimera, MD;  Location: [facility]UR[facility] ORS;  Service: Neurosurgery;  Laterality: Bilateral;  . LUMBAR WOUND DEBRIDEMENT N/A 08/19/2014   Procedure: Exploration of Lumbar wound;  Surgeon: Karie Chimera, MD;  Location: [facility] NEUR[facility] ORS;  Service: Neurosurgery;  Laterality: N/A;  . PERIPHERAL VASCULAR CATHETERIZATION Left 08/12/2015   Procedure: Lower Extremity Angiography;  Surgeon: Katha Cabal, MD;   Location: [facility] CV LAB;  Service: Cardiovascular;  Laterality: Left;  . TONSILLECTOMY    . VENTRAL HERNIA REPAIR N/A 05/26/2015   Procedure: HERNIA REPAIR VENTRAL ADULT;  Surgeon: Christene Lye, MD;  Location: [facility] ORS;  Service: General;  Laterality: N/A;   Family History  Problem Relation Age of Onset  . Alzheimer's disease Mother   . Heart attack Father   . Pancreatic cancer Sister   . Healthy Brother    Social History   Socioeconomic History  . Marital status: Divorced    Spouse name: None  . Number of children: 1  . Years of education: College  . Highest education level: Some college, no degree  Social Needs  . Financial resource strain: Not hard at all  . Food insecurity - worry: Never true  . Food insecurity - inability: Never true  . Transportation needs - medical: No  . Transportation needs - non-medical: No  Occupational History  . Occupation: Retired  Tobacco Use  . Smoking status: Former Smoker    Packs/day: 1.00    Years: 45.00    Pack years: 45.00    Last attempt to quit: 08/19/2014    Years since quitting: 2.5  . Smokeless tobacco: Former Systems developer    Quit date: 08/19/2014  Substance and Sexual Activity  . Alcohol use: No  . Drug use: No  . Sexual activity: None  Other Topics Concern  . None  Social History Narrative  . None    Outpatient Encounter Medications as of 03/09/2017  Medication Sig  . albuterol (PROVENTIL HFA;VENTOLIN HFA) 108 (90 Base) MCG/ACT inhaler Inhale 2 puffs into the lungs every 6 (six) hours as needed for wheezing or shortness of breath.  Marland Kitchen aspirin EC 81 MG EC tablet Take 1 tablet (81 mg total) by mouth daily.  Marland Kitchen atorvastatin (LIPITOR) 40 MG tablet TAKE 1 TABLET BY MOUTH EVERY EVENING AT 6 PM  . cetirizine (ZYRTEC) 10 MG tablet Take 10 mg by mouth daily as needed for allergies.   Marland Kitchen esomeprazole (NEXIUM) 40 MG capsule Take 40 mg by mouth daily at 12 noon. Reported on 06/09/2015  . FLUoxetine (PROZAC) 20 MG tablet Take 1  tablet (20 mg total) by mouth daily.  . furosemide (LASIX) 40 MG tablet TAKE 1 TABLET BY MOUTH ONCE DAILY  . ibuprofen (ADVIL,MOTRIN) 600 MG tablet Take 600 mg by mouth every 6 (six) hours as needed for moderate pain.   Marland Kitchen lisinopril (PRINIVIL,ZESTRIL) 2.5 MG tablet TAKE 1 TABLET BY MOUTH ONCE DAILY  . metoprolol tartrate (LOPRESSOR) 25 MG tablet Take 25 mg by mouth 2 (two) times daily. Reported on 06/09/2015  . montelukast (SINGULAIR) 10 MG tablet Take 1 tablet (10 mg total) by  mouth at bedtime. (Patient taking differently: Take 10 mg by mouth daily as needed (allergies). )  . Potassium Chloride ER 20 MEQ TBCR Take 1 tablet by mouth daily.  . rivaroxaban (XARELTO) 20 MG TABS tablet Take 20 mg by mouth daily with supper.  . [DISCONTINUED] potassium chloride SA (K-DUR,KLOR-CON) 20 MEQ tablet TAKE 1 TABLET BY MOUTH ONCE DAILY   No facility-administered encounter medications on file as of 03/09/2017.     Activities of Daily Living In your present state of health, do you have any difficulty performing the following activities: 03/09/2017 08/16/2016  Hearing? N N  Vision? N N  Difficulty concentrating or making decisions? N N  Walking or climbing stairs? Y N  Comment Cant climb a lot of steps due to back pain.  -  Dressing or bathing? N N  Comment Due to SOB and low energy.  -  Doing errands, shopping? Y N  Comment Can drive but family does not want her driving.  -  Preparing Food and eating ? N -  Using the Toilet? N -  In the past six months, have you accidently leaked urine? Y -  Comment Wears protection daily.  -  Do you have problems with loss of bowel control? Y -  Comment Rare -  Managing your Medications? N -  Managing your Finances? N -  Housekeeping or managing your Housekeeping? Y -  Comment Due to back. -  Some recent data might be hidden    Patient Care Team: Mar Daring, PA-C as PCP - General (Family Medicine) Anell Barr, OD as Consulting Physician  (Optometry) Corey Skains, MD as Consulting Physician (Cardiology) Schnier, Dolores Lory, MD as Consulting Physician (Vascular Surgery)    Assessment:   This is a routine wellness examination for Kayla Maxwell.  Exercise Activities and Dietary recommendations Current Exercise Habits: The patient does not participate in regular exercise at present, Exercise limited by: orthopedic condition(s)  Goals    None      Fall Risk Fall Risk  03/09/2017 06/07/2016  Falls in the past year? No No   Is the patient's home free of loose throw rugs in walkways, pet beds, electrical cords, etc?   yes      Grab bars in the bathroom? no      Handrails on the stairs?   no      Adequate lighting?   yes  Timed Get Up and Go performed: N/A  Depression Screen PHQ 2/9 Scores 03/09/2017 06/07/2016  PHQ - 2 Score 1 1  PHQ- 9 Score - 7     Cognitive Function: Pt declined screening today.         Immunization History  Administered Date(s) Administered  . Influenza Split 02/23/2006, 12/23/2010  . Influenza, High Dose Seasonal PF 10/29/2014  . Pneumococcal Polysaccharide-23 12/23/2010  . Td 09/04/2004    Qualifies for Shingles Vaccine? Due for Shingles vaccine. Declined my offer to administer today. Education has been provided regarding the importance of this vaccine. Pt has been advised to call her insurance company to determine her out of pocket expense. Advised she may also receive this vaccine at her local pharmacy or Health Dept. Verbalized acceptance and understanding.  Screening Tests Health Maintenance  Topic Date Due  . Hepatitis C Screening  [birth year]  . PNA vac Low Risk Adult (2 of 2 - PCV13) 12/23/2011  . TETANUS/TDAP  09/05/2014  . INFLUENZA VACCINE  04/03/2017 (Originally 08/04/2016)  . COLONOSCOPY  03/20/2017  .  MAMMOGRAM  04/13/2018  . DEXA SCAN  Completed    Cancer Screenings: Lung: Low Dose CT Chest recommended if Age 44-80 years, 30 pack-year currently smoking OR have quit w/in  15years. Patient does qualify. An Epic message has been sent to Burgess Estelle, RN (Oncology Nurse Navigator) regarding the possible need for this exam. Raquel Sarna will review the patient's chart to determine if the patient truly qualifies for the exam. If the patient qualifies, Raquel Sarna will order the Low Dose CT of the chest to facilitate the scheduling of this exam.  Breast:  Up to date on Mammogram? Yes   Up to date of Bone Density/Dexa? Yes Colorectal: Up to date  Additional Screenings:  Hepatitis C Screening: Pt declines today.      Plan:  I have personally reviewed and addressed the Medicare Annual Wellness questionnaire and have noted the following in the patient's chart:  A. Medical and social history B. Use of alcohol, tobacco or illicit drugs  C. Current medications and supplements D. Functional ability and status E.  Nutritional status F.  Physical activity G. Advance directives H. List of other physicians I.  Hospitalizations, surgeries, and ER visits in previous 12 months J.  [place] such as hearing and vision if needed, cognitive and depression L. Referrals and appointments - none  In addition, I have reviewed and discussed with patient certain preventive protocols, quality metrics, and best practice recommendations. A written personalized care plan for preventive services as well as general preventive health recommendations were provided to patient.  See attached scanned questionnaire for additional information.   Signed,  Fabio Neighbors, LPN Nurse Health Advisor   Nurse Recommendations: Pt declined the influenza vaccine, Prevnar 13 vaccine and Hepatitis C screening today. Pt would like to add the Hep C lab to her yearly blood work orders at next [place]. Message sent to Burgess Estelle to review pts chart and see if eligible for a CT scan due to smoking history.

## 2017-03-10 ENCOUNTER — Telehealth: Payer: Self-pay | Admitting: *Deleted

## 2017-03-10 NOTE — Telephone Encounter (Signed)
Received referral for low dose lung cancer screening CT scan. Message left at phone number listed in EMR for patient to call me back to facilitate scheduling scan.  

## 2017-03-15 ENCOUNTER — Telehealth: Payer: Self-pay | Admitting: *Deleted

## 2017-03-15 DIAGNOSIS — Z122 Encounter for screening for malignant neoplasm of respiratory organs: Secondary | ICD-10-CM

## 2017-03-15 DIAGNOSIS — Z87891 Personal history of nicotine dependence: Secondary | ICD-10-CM

## 2017-03-15 NOTE — Telephone Encounter (Signed)
Received referral for initial lung cancer screening scan. Contacted patient and obtained smoking history,(former, quit 2016, 47 pack year) as well as answering questions related to screening process. Patient denies signs of lung cancer such as weight loss or hemoptysis. Patient denies comorbidity that would prevent curative treatment if lung cancer were found. Patient is scheduled for shared decision making visit and CT scan on 03/29/17.

## 2017-03-23 ENCOUNTER — Other Ambulatory Visit: Payer: Self-pay | Admitting: Physician Assistant

## 2017-03-23 DIAGNOSIS — F320 Major depressive disorder, single episode, mild: Secondary | ICD-10-CM

## 2017-03-23 MED ORDER — FLUOXETINE HCL 20 MG PO TABS: 20.0000 mg | ORAL_TABLET | Freq: Every day | ORAL | 3 refills | Status: DC

## 2017-03-23 NOTE — Telephone Encounter (Signed)
[facility] faxed a refill request for a 90-days supply for the following medication. Thanks CC  FLUoxetine (PROZAC) 20 MG tablet

## 2017-03-24 ENCOUNTER — Other Ambulatory Visit: Payer: Self-pay | Admitting: Physician Assistant

## 2017-03-24 DIAGNOSIS — F3341 Major depressive disorder, recurrent, in partial remission: Secondary | ICD-10-CM

## 2017-03-24 MED ORDER — FLUOXETINE HCL 20 MG PO CAPS: 20.0000 mg | ORAL_CAPSULE | Freq: Every day | ORAL | 1 refills | Status: DC

## 2017-03-24 NOTE — Progress Notes (Signed)
Fluoxetine changed to capsule from tablet due to tablet not covered

## 2017-03-28 ENCOUNTER — Encounter: Payer: Self-pay | Admitting: Oncology

## 2017-03-29 ENCOUNTER — Inpatient Hospital Stay: Payer: Medicare Other | Attending: Oncology | Admitting: Oncology

## 2017-03-29 ENCOUNTER — Ambulatory Visit
Admission: RE | Admit: 2017-03-29 | Discharge: 2017-03-29 | Disposition: A | Discharge status: Home / Self Care | Payer: Medicare Other | Source: Ambulatory Visit | Attending: Oncology | Admitting: Oncology

## 2017-03-29 DIAGNOSIS — Z122 Encounter for screening for malignant neoplasm of respiratory organs: Secondary | ICD-10-CM

## 2017-03-29 DIAGNOSIS — J438 Other emphysema: Secondary | ICD-10-CM | POA: Diagnosis not present

## 2017-03-29 DIAGNOSIS — Z87891 Personal history of nicotine dependence: Secondary | ICD-10-CM

## 2017-03-29 DIAGNOSIS — J432 Centrilobular emphysema: Secondary | ICD-10-CM | POA: Insufficient documentation

## 2017-03-29 DIAGNOSIS — I700 Atherosclerosis of aorta: Secondary | ICD-10-CM | POA: Diagnosis not present

## 2017-03-29 DIAGNOSIS — I2510 Atherosclerotic heart disease of native coronary artery without angina pectoris: Secondary | ICD-10-CM | POA: Diagnosis not present

## 2017-03-29 NOTE — Progress Notes (Signed)
In accordance with CMS guidelines, patient has met eligibility criteria including age, absence of signs or symptoms of lung cancer.  Social History   Tobacco Use  . Smoking status: Former Smoker    Packs/day: 1.00    Years: 47.00    Pack years: 47.00    Last attempt to quit: 08/19/2014    Years since quitting: 2.6  . Smokeless tobacco: Former Systems developer    Quit date: 08/19/2014  Substance Use Topics  . Alcohol use: No  . Drug use: No     A shared decision-making session was conducted prior to the performance of CT scan. This includes one or more decision aids, includes benefits and harms of screening, follow-up diagnostic testing, over-diagnosis, false positive rate, and total radiation exposure.  Counseling on the importance of adherence to annual lung cancer LDCT screening, impact of co-morbidities, and ability or willingness to undergo diagnosis and treatment is imperative for compliance of the program.  Counseling on the importance of continued smoking cessation for former smokers; the importance of smoking cessation for current smokers, and information about tobacco cessation interventions have been given to patient including [place] and 1800 quit [place] programs.  Written order for lung cancer screening with LDCT has been given to the patient and any and all questions have been answered to the best of my abilities.   Yearly follow up will be coordinated by Burgess Estelle, Thoracic Navigator.  Faythe Casa, NP 03/29/2017 12:01 PM

## 2017-03-30 DIAGNOSIS — I63232 Cerebral infarction due to unspecified occlusion or stenosis of left carotid arteries: Secondary | ICD-10-CM | POA: Diagnosis not present

## 2017-03-30 DIAGNOSIS — E782 Mixed hyperlipidemia: Secondary | ICD-10-CM | POA: Diagnosis not present

## 2017-03-30 DIAGNOSIS — R0602 Shortness of breath: Secondary | ICD-10-CM | POA: Diagnosis not present

## 2017-03-30 DIAGNOSIS — I340 Nonrheumatic mitral (valve) insufficiency: Secondary | ICD-10-CM | POA: Diagnosis not present

## 2017-03-30 DIAGNOSIS — I10 Essential (primary) hypertension: Secondary | ICD-10-CM | POA: Diagnosis not present

## 2017-03-30 DIAGNOSIS — I6523 Occlusion and stenosis of bilateral carotid arteries: Secondary | ICD-10-CM | POA: Diagnosis not present

## 2017-03-30 DIAGNOSIS — I70219 Atherosclerosis of native arteries of extremities with intermittent claudication, unspecified extremity: Secondary | ICD-10-CM | POA: Diagnosis not present

## 2017-03-30 DIAGNOSIS — I482 Chronic atrial fibrillation: Secondary | ICD-10-CM | POA: Diagnosis not present

## 2017-03-30 DIAGNOSIS — I071 Rheumatic tricuspid insufficiency: Secondary | ICD-10-CM | POA: Diagnosis not present

## 2017-03-30 DIAGNOSIS — G4733 Obstructive sleep apnea (adult) (pediatric): Secondary | ICD-10-CM | POA: Diagnosis not present

## 2017-03-30 DIAGNOSIS — F17219 Nicotine dependence, cigarettes, with unspecified nicotine-induced disorders: Secondary | ICD-10-CM | POA: Diagnosis not present

## 2017-04-04 ENCOUNTER — Encounter: Payer: Self-pay | Admitting: *Deleted

## 2017-04-04 ENCOUNTER — Encounter: Payer: Self-pay | Admitting: Physician Assistant

## 2017-04-04 ENCOUNTER — Ambulatory Visit (INDEPENDENT_AMBULATORY_CARE_PROVIDER_SITE_OTHER): Payer: Medicare Other | Admitting: Physician Assistant

## 2017-04-04 VITALS — BP 100/70 | HR 89 | Temp 98.2°F | Resp 16 | Ht 65.0 in | Wt 211.6 lb

## 2017-04-04 DIAGNOSIS — I482 Chronic atrial fibrillation, unspecified: Secondary | ICD-10-CM

## 2017-04-04 DIAGNOSIS — E785 Hyperlipidemia, unspecified: Secondary | ICD-10-CM | POA: Diagnosis not present

## 2017-04-04 DIAGNOSIS — I6523 Occlusion and stenosis of bilateral carotid arteries: Secondary | ICD-10-CM | POA: Diagnosis not present

## 2017-04-04 DIAGNOSIS — G453 Amaurosis fugax: Secondary | ICD-10-CM | POA: Diagnosis not present

## 2017-04-04 DIAGNOSIS — R7309 Other abnormal glucose: Secondary | ICD-10-CM | POA: Diagnosis not present

## 2017-04-04 DIAGNOSIS — F17219 Nicotine dependence, cigarettes, with unspecified nicotine-induced disorders: Secondary | ICD-10-CM

## 2017-04-04 DIAGNOSIS — I70219 Atherosclerosis of native arteries of extremities with intermittent claudication, unspecified extremity: Secondary | ICD-10-CM | POA: Diagnosis not present

## 2017-04-04 DIAGNOSIS — H6123 Impacted cerumen, bilateral: Secondary | ICD-10-CM

## 2017-04-04 DIAGNOSIS — Z87891 Personal history of nicotine dependence: Secondary | ICD-10-CM

## 2017-04-04 NOTE — Patient Instructions (Signed)

## 2017-04-04 NOTE — Progress Notes (Signed)
Patient: Kayla Maxwell Female    DOB: [birth year]   72 y.o.   MRN: [id number] Visit Date: 04/04/2017  Today's Provider: Mar Daring, PA-C   No chief complaint on file.  Subjective:    HPI  Patient is here to follow-up CT-chest for lung screening results. Patient saw [facility] on 03/06 for her AWE.  Lung cancer screening showed multiple small pulmonary nodules throughout both lungs with mild bronchial wall thickening and emphysema changes. Lesions are suspected to be benign but is recommended to continue annual lung cancer screenings.   She was also noted to have atherosclerosis in multiple arteries. Patient has known PAD, CAD followed by Dr. Delana Meyer, vascular surgery, and is on xarelto 20mg , pravastatin 40mg  and ASA 81mg . She does complain today of having visual changes in her left eye. She describes it as a "darkening of her vision" that lasts for a few seconds. It comes without pain. She denies any other visual changes. She has not seen her eye doctor recently.   She also is having ear pain bilaterally. Denies any hearing loss or drainage.    6CIT Screen 04/04/2017  What Year? 0 points  What month? 0 points  What time? 0 points  Count back from 20 0 points  Months in reverse 0 points  Repeat phrase 4 points  Total Score 4       Allergies  Allergen Reactions  . Codeine Other (See Comments)    Makes her feel "crazy"  . Penicillins Swelling    Has patient had a PCN reaction causing immediate rash, facial/tongue/throat swelling, SOB or lightheadedness with hypotension: unknown Has patient had a PCN reaction causing severe rash involving mucus membranes or skin necrosis: unknown Has patient had a PCN reaction that required hospitalization unknown Has patient had a PCN reaction occurring within the last 10 years: no If all of the above answers are "NO", then may proceed with Cephalosporin use.  . Adhesive [Tape] Hives  . Sulfa Antibiotics Nausea Only and Rash     Current  Outpatient Medications:  .  albuterol (PROVENTIL HFA;VENTOLIN HFA) 108 (90 Base) MCG/ACT inhaler, Inhale 2 puffs into the lungs every 6 (six) hours as needed for wheezing or shortness of breath., Disp: 1 Inhaler, Rfl: 5 .  aspirin EC 81 MG EC tablet, Take 1 tablet (81 mg total) by mouth daily., Disp: 30 tablet, Rfl: 1 .  atorvastatin (LIPITOR) 40 MG tablet, TAKE 1 TABLET BY MOUTH EVERY EVENING AT 6 PM, Disp: 30 tablet, Rfl: 12 .  cetirizine (ZYRTEC) 10 MG tablet, Take 10 mg by mouth daily as needed for allergies. , Disp: , Rfl:  .  esomeprazole (NEXIUM) 40 MG capsule, Take 40 mg by mouth daily at 12 noon. Reported on 06/09/2015, Disp: , Rfl:  .  FLUoxetine (PROZAC) 20 MG capsule, Take 1 capsule (20 mg total) by mouth daily., Disp: 90 capsule, Rfl: 1 .  furosemide (LASIX) 40 MG tablet, TAKE 1 TABLET BY MOUTH ONCE DAILY, Disp: 90 tablet, Rfl: 1 .  ibuprofen (ADVIL,MOTRIN) 600 MG tablet, Take 600 mg by mouth every 6 (six) hours as needed for moderate pain. , Disp: , Rfl:  .  lisinopril (PRINIVIL,ZESTRIL) 2.5 MG tablet, TAKE 1 TABLET BY MOUTH ONCE DAILY, Disp: 90 tablet, Rfl: 1 .  metoprolol tartrate (LOPRESSOR) 25 MG tablet, Take 25 mg by mouth 2 (two) times daily. Reported on 06/09/2015, Disp: , Rfl:  .  montelukast (SINGULAIR) 10 MG tablet, Take 1 tablet (10 mg total)  by mouth at bedtime. (Patient taking differently: Take 10 mg by mouth daily as needed (allergies). ), Disp: 30 tablet, Rfl: 3 .  Potassium Chloride ER 20 MEQ TBCR, Take 1 tablet by mouth daily., Disp: 30 tablet, Rfl: 3 .  rivaroxaban (XARELTO) 20 MG TABS tablet, Take 20 mg by mouth daily with supper., Disp: , Rfl:   Review of Systems  Constitutional: Positive for fatigue.  HENT: Positive for congestion, ear pain, nosebleeds and sneezing.   Eyes: Positive for photophobia, pain and itching.  Respiratory: Positive for apnea, cough, choking and shortness of breath.   Cardiovascular: Positive for leg swelling.  Gastrointestinal: Positive for  abdominal distention and constipation.  Genitourinary: Positive for enuresis.  Musculoskeletal: Positive for arthralgias, back pain and neck pain.  Allergic/Immunologic: Positive for environmental allergies.  Neurological: Positive for dizziness, tremors and weakness.  Psychiatric/Behavioral: Positive for dysphoric mood and sleep disturbance. The patient is nervous/anxious.     Social History   Tobacco Use  . Smoking status: Former Smoker    Packs/day: 1.00    Years: 47.00    Pack years: 47.00    Last attempt to quit: 08/19/2014    Years since quitting: 2.6  . Smokeless tobacco: Former Systems developer    Quit date: 08/19/2014  Substance Use Topics  . Alcohol use: No   Objective:   BP 100/70 (BP Location: Left Arm, Patient Position: Sitting, Cuff Size: Normal)   Pulse 89   Temp 98.2 F (36.8 C) (Oral)   Resp 16   Ht 5\' 5"  (1.651 m)   Wt 211 lb 9.6 oz (96 kg)   BMI 35.21 kg/m    Physical Exam  Constitutional: She is oriented to person, place, and time. She appears well-developed and well-nourished. No distress.  HENT:  Head: Normocephalic and atraumatic.  Right Ear: External ear normal.  Left Ear: External ear normal.  Nose: Nose normal.  Mouth/Throat: Oropharynx is clear and moist. No oropharyngeal exudate.  TM not visualized due to cerumen impaction. Lavage attempted but unsuccessful.  Eyes: Pupils are equal, round, and reactive to light. Conjunctivae and EOM are normal.  Neck: Normal range of motion. Neck supple. No JVD present. Carotid bruit is not present. No tracheal deviation present. No thyromegaly present.  Cardiovascular: Normal rate and normal heart sounds. An irregularly irregular rhythm present. Exam reveals no gallop and no friction rub.  No murmur heard. Pulmonary/Chest: Effort normal and breath sounds normal. No respiratory distress. She has no wheezes. She has no rales.  Lymphadenopathy:    She has no cervical adenopathy.  Neurological: She is alert and oriented to  person, place, and time.  Skin: She is not diaphoretic.  Psychiatric: She has a normal mood and affect. Her behavior is normal. Judgment and thought content normal.  Vitals reviewed.      Assessment & Plan:     1. Atherosclerotic peripheral vascular disease with intermittent claudication ([place]) Will check labs as below and f/u pending results. Continue xarelto, pravastatin and ASA. Has stent in left leg. Known occlusion left ICA noted from CTA 2017.  - CBC w/Diff/Platelet - Comprehensive Metabolic Panel (CMET) - Lipid Profile - HgB A1c  2. Hyperlipidemia, unspecified hyperlipidemia type Continue pravastatin 40mg . Will check labs as below and f/u pending results. - CBC w/Diff/Platelet - Comprehensive Metabolic Panel (CMET) - Lipid Profile - HgB A1c  3. Cigarette nicotine dependence with nicotine-induced disorder Quit smoking 3 years ago per patient. - CBC w/Diff/Platelet - Comprehensive Metabolic Panel (CMET) - Lipid Profile -  HgB A1c  4. Chronic atrial fibrillation ([facility]) Medication controlled. Will check labs as below and f/u pending results. - CBC w/Diff/Platelet - Comprehensive Metabolic Panel (CMET) - Lipid Profile - HgB A1c  5. Bilateral carotid artery stenosis Known occlusion L ICA. Now with possible amaurosis symptoms. Advised to call eye doctor for appt to make sure not retinal detachment. Referral and message will be sent back to Dr. Delana Meyer to make sure not amaurosis fugax. I will check labs. She is to call if symptoms worsen.  - CBC w/Diff/Platelet - Comprehensive Metabolic Panel (CMET) - Lipid Profile - HgB A1c  6. Abnormal blood sugar Diet controlled. Will check labs as below and f/u pending results. - CBC w/Diff/Platelet - Comprehensive Metabolic Panel (CMET) - Lipid Profile - HgB A1c  7. History of tobacco abuse Quit smoking 3 years ago.  - CBC w/Diff/Platelet - Comprehensive Metabolic Panel (CMET) - Lipid Profile - HgB A1c  8. Bilateral impacted  cerumen Debrox drops sent home with patient. Instructed on how to use. Perform daily. If wax does not release with at home treatments she is to call and will attempt lavage again.  - Ear Lavage  9. Amaurosis fugax of left eye See above medical treatment plan for # 5. - CBC w/Diff/Platelet - Comprehensive Metabolic Panel (CMET) - Lipid Profile - HgB A1c       Mar Daring, PA-C  [facility]

## 2017-04-05 ENCOUNTER — Telehealth: Payer: Self-pay

## 2017-04-05 LAB — COMPREHENSIVE METABOLIC PANEL
ALT: 11 IU/L (ref 0–32)
AST: 15 IU/L (ref 0–40)
Albumin/Globulin Ratio: 1.9 (ref 1.2–2.2)
Albumin: 4.4 g/dL (ref 3.5–4.8)
Alkaline Phosphatase: 87 IU/L (ref 39–117)
BUN/Creatinine Ratio: 26 (ref 12–28)
BUN: 31 mg/dL — ABNORMAL HIGH (ref 8–27)
Bilirubin Total: 0.5 mg/dL (ref 0.0–1.2)
CO2: 23 mmol/L (ref 20–29)
Calcium: 9.6 mg/dL (ref 8.7–10.3)
Chloride: 98 mmol/L (ref 96–106)
Creatinine, Ser: 1.21 mg/dL — ABNORMAL HIGH (ref 0.57–1.00)
GFR calc Af Amer: 52 mL/min/{1.73_m2} — ABNORMAL LOW (ref >59)
GFR calc non Af Amer: 45 mL/min/{1.73_m2} — ABNORMAL LOW (ref >59)
Globulin, Total: 2.3 g/dL (ref 1.5–4.5)
Glucose: 118 mg/dL — ABNORMAL HIGH (ref 65–99)
Potassium: 5.6 mmol/L — ABNORMAL HIGH (ref 3.5–5.2)
Sodium: 141 mmol/L (ref 134–144)
Total Protein: 6.7 g/dL (ref 6.0–8.5)

## 2017-04-05 LAB — CBC WITH DIFFERENTIAL/PLATELET
Basophils Absolute: 0 10*3/uL (ref 0.0–0.2)
Basos: 0 %
EOS (ABSOLUTE): 0.1 10*3/uL (ref 0.0–0.4)
Eos: 1 %
Hematocrit: 39.2 % (ref 34.0–46.6)
Hemoglobin: 12 g/dL (ref 11.1–15.9)
Immature Grans (Abs): 0 10*3/uL (ref 0.0–0.1)
Immature Granulocytes: 0 %
Lymphocytes Absolute: 2.5 10*3/uL (ref 0.7–3.1)
Lymphs: 26 %
MCH: 22.1 pg — ABNORMAL LOW (ref 26.6–33.0)
MCHC: 30.6 g/dL — ABNORMAL LOW (ref 31.5–35.7)
MCV: 72 fL — ABNORMAL LOW (ref 79–97)
Monocytes Absolute: 0.7 10*3/uL (ref 0.1–0.9)
Monocytes: 7 %
Neutrophils Absolute: 6.6 10*3/uL (ref 1.4–7.0)
Neutrophils: 66 %
Platelets: 354 10*3/uL (ref 150–379)
RBC: 5.42 x10E6/uL — ABNORMAL HIGH (ref 3.77–5.28)
RDW: 18.2 % — ABNORMAL HIGH (ref 12.3–15.4)
WBC: 10 10*3/uL (ref 3.4–10.8)

## 2017-04-05 LAB — LIPID PANEL
Chol/HDL Ratio: 2.6 ratio (ref 0.0–4.4)
Cholesterol, Total: 134 mg/dL (ref 100–199)
HDL: 51 mg/dL (ref >39)
LDL Calculated: 50 mg/dL (ref 0–99)
Triglycerides: 165 mg/dL — ABNORMAL HIGH (ref 0–149)
VLDL Cholesterol Cal: 33 mg/dL (ref 5–40)

## 2017-04-05 LAB — HEMOGLOBIN A1C
Est. average glucose Bld gHb Est-mCnc: 126 mg/dL
Hgb A1c MFr Bld: 6 % — ABNORMAL HIGH (ref 4.8–5.6)

## 2017-04-05 NOTE — Telephone Encounter (Signed)
-----   Message from Mar Daring, PA-C sent at 04/05/2017  8:24 AM EDT ----- Blood count normal and stable. Kidney function stable. Potassium is up slightly. Recommend holding potassium supplement x 3 days then can restart. Cholesterol stable and normal. A1c stable at 6.0. This [place] indicates prediabetes so make sure to try to adhere to healthy lifestyle modificaiotns including limiting carbs and sugars.

## 2017-04-05 NOTE — Telephone Encounter (Signed)
[email or web address]

## 2017-04-06 NOTE — Telephone Encounter (Signed)
Patient advised as below. Patient verbalizes understanding and is in agreement with treatment plan.  

## 2017-04-14 DIAGNOSIS — Z1231 Encounter for screening mammogram for malignant neoplasm of breast: Secondary | ICD-10-CM | POA: Diagnosis not present

## 2017-04-14 LAB — HM MAMMOGRAPHY

## 2017-04-18 ENCOUNTER — Encounter: Payer: Self-pay | Admitting: Physician Assistant

## 2017-04-21 DIAGNOSIS — R922 Inconclusive mammogram: Secondary | ICD-10-CM | POA: Diagnosis not present

## 2017-06-17 ENCOUNTER — Other Ambulatory Visit: Payer: Self-pay

## 2017-06-17 DIAGNOSIS — Z8679 Personal history of other diseases of the circulatory system: Secondary | ICD-10-CM

## 2017-06-20 ENCOUNTER — Ambulatory Visit (INDEPENDENT_AMBULATORY_CARE_PROVIDER_SITE_OTHER): Payer: Medicare Other | Admitting: Vascular Surgery

## 2017-06-20 ENCOUNTER — Encounter (INDEPENDENT_AMBULATORY_CARE_PROVIDER_SITE_OTHER): Payer: Self-pay | Admitting: Vascular Surgery

## 2017-06-20 VITALS — BP 120/71 | HR 95 | Ht 65.0 in | Wt 217.6 lb

## 2017-06-20 DIAGNOSIS — I482 Chronic atrial fibrillation, unspecified: Secondary | ICD-10-CM

## 2017-06-20 DIAGNOSIS — I70223 Atherosclerosis of native arteries of extremities with rest pain, bilateral legs: Secondary | ICD-10-CM | POA: Diagnosis not present

## 2017-06-20 DIAGNOSIS — I6523 Occlusion and stenosis of bilateral carotid arteries: Secondary | ICD-10-CM | POA: Diagnosis not present

## 2017-06-20 DIAGNOSIS — M7989 Other specified soft tissue disorders: Secondary | ICD-10-CM

## 2017-06-20 DIAGNOSIS — M79669 Pain in unspecified lower leg: Secondary | ICD-10-CM | POA: Diagnosis not present

## 2017-06-20 DIAGNOSIS — E785 Hyperlipidemia, unspecified: Secondary | ICD-10-CM

## 2017-06-20 MED ORDER — FUROSEMIDE 40 MG PO TABS: 40.0000 mg | ORAL_TABLET | Freq: Every day | ORAL | 1 refills | Status: DC

## 2017-06-20 NOTE — Progress Notes (Signed)
MRN : [id number]  Kayla Maxwell is a 72 y.o. (1945/05/12) female who presents with chief complaint of  Chief Complaint  Patient presents with  . Follow-up    bilateral feet swollen and red.   Marland Kitchen  History of Present Illness:   The patient is seen for evaluation of recurrent pain of the lower extremities. Patient notes the pain is variable and not always associated with activity. It is associated with redness of the forefeet bilaterally.  The pain is somewhat consistent day to day occurring on most days. The patient notes the pain also occurs with standing and routinely seems worse as the day wears on. The pain has been progressive over the past several years. The patient states these symptoms are causing  a profound negative impact on quality of life and daily activities.  She has a history of PAD s/p intervention:   Procedure(s) Performed 08/12/2015:             1.  Abdominal aortogram with left lower extremity distal runoff              2.  Introduction catheter into aorta bilateral femoral approach             3.  Percutaneous transluminal angioplasty right common iliac artery; "kissing balloon" technique             4.  Percutaneous transluminal and plasty and stent placement left common iliac artery; "kissing balloon" technique             5.  Ultrasound guided access bilateral common femoral arteries  The patient denies rest pain or dangling of an extremity off the side of the bed during the night for relief. No open wounds or sores at this time. No history of DVT or phlebitis. No prior interventions or surgeries.  There is a  history of back problems and DJD of the lumbar and sacral spine.        Current Meds  Medication Sig  . albuterol (PROVENTIL HFA;VENTOLIN HFA) 108 (90 Base) MCG/ACT inhaler Inhale 2 puffs into the lungs every 6 (six) hours as needed for wheezing or shortness of breath.  Marland Kitchen aspirin EC 81 MG EC tablet Take 1 tablet (81 mg total) by mouth daily.  Marland Kitchen  atorvastatin (LIPITOR) 40 MG tablet TAKE 1 TABLET BY MOUTH EVERY EVENING AT 6 PM  . cetirizine (ZYRTEC) 10 MG tablet Take 10 mg by mouth daily as needed for allergies.   Marland Kitchen esomeprazole (NEXIUM) 40 MG capsule Take 40 mg by mouth daily at 12 noon. Reported on 06/09/2015  . FLUoxetine (PROZAC) 20 MG capsule Take 1 capsule (20 mg total) by mouth daily.  . furosemide (LASIX) 40 MG tablet Take 1 tablet (40 mg total) by mouth daily.  Marland Kitchen ibuprofen (ADVIL,MOTRIN) 600 MG tablet Take 600 mg by mouth every 6 (six) hours as needed for moderate pain.   Marland Kitchen lisinopril (PRINIVIL,ZESTRIL) 2.5 MG tablet TAKE 1 TABLET BY MOUTH ONCE DAILY  . metoprolol tartrate (LOPRESSOR) 25 MG tablet Take 25 mg by mouth 2 (two) times daily. Reported on 06/09/2015  . montelukast (SINGULAIR) 10 MG tablet Take 1 tablet (10 mg total) by mouth at bedtime. (Patient taking differently: Take 10 mg by mouth daily as needed (allergies). )  . Potassium Chloride ER 20 MEQ TBCR Take 1 tablet by mouth daily.  . rivaroxaban (XARELTO) 20 MG TABS tablet Take 20 mg by mouth daily with supper.    Past Medical History:  Diagnosis Date  . Acid reflux    takes Nexium daily  . Allergy    takes Singulair daily as needed  . Anemia   . Atrial fibrillation ([place])   . Cancer ([place]) AGE 4   CERVICAL cancer with removal  . Chronic back pain    stenosis  . Complication of anesthesia   . Constipation    takes Colace daily as needed  . Coronary artery disease   . Difficult intubation   . Dysrhythmia    atrial fibrillation dx 04/2014  . Headache    sinus   . History of blood transfusion   . History of shingles   . Hyperlipidemia    takes Pravastatin daily-started taking this 6 months ago  . Lumbar surgical wound fluid collection   . Sleep apnea    CPAP  . Stroke ([place])    TIA'S X 2 --- one last yr and one this yr...lost her memory over 4-6 hrs.  . Stroke ([place]) 06-04-15   left with right side weakness  . TIA (transient ischemic attack)    showed up  on an MRI but she never knew it  . Urinary frequency     Past Surgical History:  Procedure Laterality Date  . ABDOMINAL HYSTERECTOMY    . APPENDECTOMY  1963  . BACK SURGERY  08/19/2014   Dr. Hal Neer ([place] [facility])  . BREAST SURGERY  1984   augementation  . [place]  . CHOLECYSTECTOMY  2014   Dr Jamal Collin  . COLONOSCOPY    . ESOPHAGOGASTRODUODENOSCOPY    . HAMMER TOE SURGERY Bilateral   . LUMBAR LAMINECTOMY WITH COFLEX 1 LEVEL Bilateral 07/04/2014   Procedure: Laminectomy and Foraminotomy - bilateral - Lumbar Four-Five with coflex ;  Surgeon: Karie Chimera, MD;  Location: [facility];  Service: Neurosurgery;  Laterality: Bilateral;  . LUMBAR WOUND DEBRIDEMENT N/A 08/19/2014   Procedure: Exploration of Lumbar wound;  Surgeon: Karie Chimera, MD;  Location: [facility];  Service: Neurosurgery;  Laterality: N/A;  . PERIPHERAL VASCULAR CATHETERIZATION Left 08/12/2015   Procedure: Lower Extremity Angiography;  Surgeon: Katha Cabal, MD;  Location: [facility];  Service: Cardiovascular;  Laterality: Left;  . TONSILLECTOMY    . VENTRAL HERNIA REPAIR N/A 05/26/2015   Procedure: HERNIA REPAIR VENTRAL ADULT;  Surgeon: Christene Lye, MD;  Location: [facility];  Service: General;  Laterality: N/A;    Social History Social History   Tobacco Use  . Smoking status: Former Smoker    Packs/day: 1.00    Years: 47.00    Pack years: 47.00    Last attempt to quit: 08/19/2014    Years since quitting: 2.8  . Smokeless tobacco: Former Systems developer    Quit date: 08/19/2014  Substance Use Topics  . Alcohol use: No  . Drug use: No    Family History Family History  Problem Relation Age of Onset  . Alzheimer's disease Mother   . Heart attack Father   . Pancreatic cancer Sister   . Healthy Brother     Allergies  Allergen Reactions  . Codeine Other (See Comments)    Makes her feel "crazy"  . Penicillins Swelling    Has patient had a PCN reaction causing immediate rash,  facial/tongue/throat swelling, SOB or lightheadedness with hypotension: unknown Has patient had a PCN reaction causing severe rash involving mucus membranes or skin necrosis: unknown Has patient had a PCN reaction that required hospitalization unknown Has patient had a PCN reaction  occurring within the last 10 years: no If all of the above answers are "NO", then may proceed with Cephalosporin use.  . Adhesive [Tape] Hives  . Sulfa Antibiotics Nausea Only and Rash     REVIEW OF SYSTEMS (Negative unless checked)  Constitutional: [] Weight loss  [] Fever  [] Chills Cardiac: [] Chest pain   [] Chest pressure   [] Palpitations   [] Shortness of breath when laying flat   [] Shortness of breath with exertion. Vascular:  [x] Pain in legs with walking   [x] Pain in legs at rest  [] History of DVT   [] Phlebitis   [x] Swelling in legs   [] Varicose veins   [] Non-healing ulcers Pulmonary:   [] Uses home oxygen   [] Productive cough   [] Hemoptysis   [] Wheeze  [] COPD   [] Asthma Neurologic:  [] Dizziness   [] Seizures   [] History of stroke   [] History of TIA  [] Aphasia   [] Vissual changes   [] Weakness or numbness in arm   [] Weakness or numbness in leg Musculoskeletal:   [] Joint swelling   [] Joint pain   [] Low back pain Hematologic:  [] Easy bruising  [] Easy bleeding   [] Hypercoagulable state   [] Anemic Gastrointestinal:  [] Diarrhea   [] Vomiting  [] Gastroesophageal reflux/heartburn   [] Difficulty swallowing. Genitourinary:  [] Chronic kidney disease   [] Difficult urination  [] Frequent urination   [] Blood in urine Skin:  [] Rashes   [] Ulcers  Psychological:  [] History of anxiety   []  History of major depression.  Physical Examination  Vitals:   06/20/17 1510  BP: 120/71  Pulse: 95  Weight: 217 lb 9.6 oz (98.7 kg)  Height: 5\' 5"  (1.651 m)   Body mass index is 36.21 kg/m. Gen: WD/WN, NAD Head: [place]/AT, No temporalis wasting.  Ear/Nose/Throat: Hearing grossly intact, nares w/o erythema or drainage Eyes: PER, EOMI,  sclera nonicteric.  Neck: Supple, no large masses.   Pulmonary:  Good air movement, no audible wheezing bilaterally, no use of accessory muscles.  Cardiac: RRR, no JVD Vascular:  Both feet arm warm to the touch and are red with brisk cap refill Vessel Right Left  Radial Palpable Palpable  Popliteal Not Palpable Not Palpable  PT Not Palpable Not Palpable  DP Not Palpable Not Palpable  Gastrointestinal: Non-distended. No guarding/no peritoneal signs.  Musculoskeletal: M/S 5/5 throughout.  No deformity or atrophy.  Neurologic: CN 2-12 intact. Symmetrical.  Speech is fluent. Motor exam as listed above. Psychiatric: Judgment intact, Mood & affect appropriate for pt's clinical situation. Dermatologic: red rashes no ulcers noted.  No changes consistent with cellulitis. Lymph : No lichenification or skin changes of chronic lymphedema.  CBC Lab Results  Component Value Date   WBC 10.0 04/04/2017   HGB 12.0 04/04/2017   HCT 39.2 04/04/2017   MCV 72 (L) 04/04/2017   PLT 354 04/04/2017    BMET    Component Value Date/Time   NA 141 04/04/2017 0000   NA 141 11/10/2013 0431   K 5.6 (H) 04/04/2017 0000   K 4.2 11/10/2013 0431   CL 98 04/04/2017 0000   CL 112 (H) 11/10/2013 0431   CO2 23 04/04/2017 0000   CO2 24 11/10/2013 0431   GLUCOSE 118 (H) 04/04/2017 0000   GLUCOSE 95 08/17/2016 0525   GLUCOSE 146 (H) 11/10/2013 0431   BUN 31 (H) 04/04/2017 0000   BUN 14 11/10/2013 0431   CREATININE 1.21 (H) 04/04/2017 0000   CREATININE 0.73 11/10/2013 0431   CALCIUM 9.6 04/04/2017 0000   CALCIUM 7.9 (L) 11/10/2013 0431   GFRNONAA 45 (L) 04/04/2017 0000  GFRNONAA >60 11/10/2013 0431   GFRNONAA >60 04/27/2012 1832   GFRAA 52 (L) 04/04/2017 0000   GFRAA >60 11/10/2013 0431   GFRAA >60 04/27/2012 1832   CrCl cannot be calculated (Patient's most recent lab result is older than the maximum 21 days allowed.).  COAG Lab Results  Component Value Date   INR 1.57 08/16/2016   INR 1.50  06/06/2015   INR 0.98 07/04/2014    Radiology No results found.   Assessment/Plan 1. Pain and swelling of lower leg, unspecified laterality  Recommend:  The patient has atypical pain symptoms for pure atherosclerotic disease. However, on physical exam there is evidence of mixed venous and arterial disease, given the diminished pulses and the edema associated with venous changes of the legs.  Noninvasive studies including CT angiogram with bilateral run off and venous ultrasound of the legs will be obtained and the patient will follow up with me to review these studies.  The patient should continue walking and begin a more formal exercise program. The patient should continue his antiplatelet therapy and aggressive treatment of the lipid abnormalities.  The patient should begin wearing graduated compression socks 15-20 mmHg strength to control edema.   - CT ANGIO AO+BIFEM W & OR WO CONTRAST; Future - VAS [place] LOWER EXTREMITY VENOUS (DVT); Future  2. Atherosclerosis of native artery of both lower extremities with rest pain ([place]) See #1 - CT ANGIO AO+BIFEM W & OR WO CONTRAST; Future  3. Chronic atrial fibrillation (HCC) Continue antiarrhythmia medications as already ordered, these medications have been reviewed and there are no changes at this time.  Continue anticoagulation as ordered by Cardiology Service   4. Hyperlipidemia, unspecified hyperlipidemia type Continue statin as ordered and reviewed, no changes at this time    Hortencia Pilar, MD  06/20/2017 3:21 PM

## 2017-06-23 ENCOUNTER — Other Ambulatory Visit: Payer: Self-pay | Admitting: Physician Assistant

## 2017-06-23 DIAGNOSIS — K219 Gastro-esophageal reflux disease without esophagitis: Secondary | ICD-10-CM

## 2017-06-23 MED ORDER — ESOMEPRAZOLE MAGNESIUM 40 MG PO CPDR: 40.0000 mg | DELAYED_RELEASE_CAPSULE | Freq: Every day | ORAL | 1 refills | Status: DC

## 2017-06-23 NOTE — Progress Notes (Signed)
Refilled Nexium 

## 2017-06-24 ENCOUNTER — Telehealth: Payer: Self-pay | Admitting: Physician Assistant

## 2017-06-24 DIAGNOSIS — K219 Gastro-esophageal reflux disease without esophagitis: Secondary | ICD-10-CM

## 2017-06-24 MED ORDER — OMEPRAZOLE 40 MG PO CPDR: 40.0000 mg | DELAYED_RELEASE_CAPSULE | Freq: Every day | ORAL | 1 refills | Status: DC

## 2017-06-24 NOTE — Telephone Encounter (Signed)
Changed therapy 

## 2017-06-24 NOTE — Telephone Encounter (Signed)
Pt called saying the esomeprazole that was prescribed was going to cost her 200.00 a month.  The pgharamacy suggested she use the  omeprazole 40 mg which will only cost 1.00 a month  She wants a 90 days supply  [facility]  Thanks  teri

## 2017-06-29 ENCOUNTER — Ambulatory Visit
Admission: RE | Admit: 2017-06-29 | Discharge: 2017-06-29 | Disposition: A | Discharge status: Home / Self Care | Payer: Medicare Other | Source: Ambulatory Visit | Attending: Vascular Surgery | Admitting: Vascular Surgery

## 2017-06-29 DIAGNOSIS — E782 Mixed hyperlipidemia: Secondary | ICD-10-CM | POA: Diagnosis not present

## 2017-06-29 DIAGNOSIS — M7989 Other specified soft tissue disorders: Secondary | ICD-10-CM | POA: Insufficient documentation

## 2017-06-29 DIAGNOSIS — I745 Embolism and thrombosis of iliac artery: Secondary | ICD-10-CM | POA: Insufficient documentation

## 2017-06-29 DIAGNOSIS — M79669 Pain in unspecified lower leg: Secondary | ICD-10-CM | POA: Insufficient documentation

## 2017-06-29 DIAGNOSIS — I70219 Atherosclerosis of native arteries of extremities with intermittent claudication, unspecified extremity: Secondary | ICD-10-CM | POA: Diagnosis not present

## 2017-06-29 DIAGNOSIS — I482 Chronic atrial fibrillation: Secondary | ICD-10-CM | POA: Diagnosis not present

## 2017-06-29 DIAGNOSIS — I70223 Atherosclerosis of native arteries of extremities with rest pain, bilateral legs: Secondary | ICD-10-CM | POA: Diagnosis not present

## 2017-06-29 DIAGNOSIS — I10 Essential (primary) hypertension: Secondary | ICD-10-CM | POA: Diagnosis not present

## 2017-06-29 HISTORY — DX: Essential (primary) hypertension: I10

## 2017-06-29 LAB — POCT I-STAT CREATININE: Creatinine, Ser: 1 mg/dL (ref 0.44–1.00)

## 2017-06-29 MED ORDER — IOPAMIDOL (ISOVUE-370) INJECTION 76%
125.0000 mL | Freq: Once | INTRAVENOUS | Status: AC | PRN
  Administered 2017-06-29: 125 mL via INTRAVENOUS

## 2017-07-04 ENCOUNTER — Ambulatory Visit (INDEPENDENT_AMBULATORY_CARE_PROVIDER_SITE_OTHER): Payer: Medicare Other | Admitting: Vascular Surgery

## 2017-07-11 ENCOUNTER — Encounter (INDEPENDENT_AMBULATORY_CARE_PROVIDER_SITE_OTHER): Payer: Self-pay | Admitting: Vascular Surgery

## 2017-07-11 ENCOUNTER — Ambulatory Visit (INDEPENDENT_AMBULATORY_CARE_PROVIDER_SITE_OTHER): Payer: Medicare Other | Admitting: Vascular Surgery

## 2017-07-11 VITALS — BP 111/58 | HR 85 | Resp 15 | Ht 65.0 in | Wt 214.0 lb

## 2017-07-11 DIAGNOSIS — E785 Hyperlipidemia, unspecified: Secondary | ICD-10-CM | POA: Diagnosis not present

## 2017-07-11 DIAGNOSIS — I482 Chronic atrial fibrillation, unspecified: Secondary | ICD-10-CM

## 2017-07-11 DIAGNOSIS — K219 Gastro-esophageal reflux disease without esophagitis: Secondary | ICD-10-CM | POA: Diagnosis not present

## 2017-07-11 DIAGNOSIS — I739 Peripheral vascular disease, unspecified: Secondary | ICD-10-CM

## 2017-07-11 DIAGNOSIS — I6523 Occlusion and stenosis of bilateral carotid arteries: Secondary | ICD-10-CM | POA: Diagnosis not present

## 2017-07-11 NOTE — Progress Notes (Signed)
MRN : [id number]  Kayla Maxwell is a 72 y.o. ([birth year]) female who presents with chief complaint of No chief complaint on file. Marland Kitchen  History of Present Illness:  The patient returns to the office for followup and review of the noninvasive studies. There have been no interval changes in lower extremity symptoms. No interval shortening of the patient's claudication distance or development of rest pain symptoms. No new ulcers or wounds have occurred since the last visit.  There have been no significant changes to the patient's overall health care.  The patient denies amaurosis fugax or recent TIA symptoms. There are no recent neurological changes noted. The patient denies history of DVT, PE or superficial thrombophlebitis. The patient denies recent episodes of angina or shortness of breath.     No outpatient medications have been marked as taking for the 07/11/17 encounter (Appointment) with Delana Meyer, Dolores Lory, MD.    Past Medical History:  Diagnosis Date  . Acid reflux    takes Nexium daily  . Allergy    takes Singulair daily as needed  . Anemia   . Atrial fibrillation ([place])   . Cancer ([place]) AGE 73   CERVICAL cancer with removal  . Chronic back pain    stenosis  . Complication of anesthesia   . Constipation    takes Colace daily as needed  . Coronary artery disease   . Difficult intubation   . Dysrhythmia    atrial fibrillation dx 04/2014  . Headache    sinus   . History of blood transfusion   . History of shingles   . Hyperlipidemia    takes Pravastatin daily-started taking this 6 months ago  . Hypertension   . Lumbar surgical wound fluid collection   . Sleep apnea    CPAP  . Stroke ([place])    TIA'S X 2 --- one last yr and one this yr...lost her memory over 4-6 hrs.  . Stroke ([place]) 06-04-15   left with right side weakness  . TIA (transient ischemic attack)    showed up on an MRI but she never knew it  . Urinary frequency     Past Surgical History:  Procedure  Laterality Date  . ABDOMINAL HYSTERECTOMY    . APPENDECTOMY  1963  . BACK SURGERY  08/19/2014   Dr. Hal Neer ([place] [facility])  . BREAST SURGERY  1984   augementation  . [place]  . CHOLECYSTECTOMY  2014   Dr Jamal Collin  . COLONOSCOPY    . ESOPHAGOGASTRODUODENOSCOPY    . HAMMER TOE SURGERY Bilateral   . LUMBAR LAMINECTOMY WITH COFLEX 1 LEVEL Bilateral 07/04/2014   Procedure: Laminectomy and Foraminotomy - bilateral - Lumbar Four-Five with coflex ;  Surgeon: Karie Chimera, MD;  Location: [facility];  Service: Neurosurgery;  Laterality: Bilateral;  . LUMBAR WOUND DEBRIDEMENT N/A 08/19/2014   Procedure: Exploration of Lumbar wound;  Surgeon: Karie Chimera, MD;  Location: [facility];  Service: Neurosurgery;  Laterality: N/A;  . PERIPHERAL VASCULAR CATHETERIZATION Left 08/12/2015   Procedure: Lower Extremity Angiography;  Surgeon: Katha Cabal, MD;  Location: [facility];  Service: Cardiovascular;  Laterality: Left;  . TONSILLECTOMY    . VENTRAL HERNIA REPAIR N/A 05/26/2015   Procedure: HERNIA REPAIR VENTRAL ADULT;  Surgeon: Christene Lye, MD;  Location: [facility] ORS;  Service: General;  Laterality: N/A;    Social History Social History   Tobacco Use  . Smoking status: Former Smoker    Packs/day:  1.00    Years: 47.00    Pack years: 47.00    Last attempt to quit: 08/19/2014    Years since quitting: 2.8  . Smokeless tobacco: Former Systems developer    Quit date: 08/19/2014  Substance Use Topics  . Alcohol use: No  . Drug use: No    Family History Family History  Problem Relation Age of Onset  . Alzheimer's disease Mother   . Heart attack Father   . Pancreatic cancer Sister   . Healthy Brother     Allergies  Allergen Reactions  . Codeine Other (See Comments)    Makes her feel "crazy"  . Penicillins Swelling    Has patient had a PCN reaction causing immediate rash, facial/tongue/throat swelling, SOB or lightheadedness with hypotension: unknown Has patient had  a PCN reaction causing severe rash involving mucus membranes or skin necrosis: unknown Has patient had a PCN reaction that required hospitalization unknown Has patient had a PCN reaction occurring within the last 10 years: no If all of the above answers are "NO", then may proceed with Cephalosporin use.  . Adhesive [Tape] Hives  . Sulfa Antibiotics Nausea Only and Rash     REVIEW OF SYSTEMS (Negative unless checked)  Constitutional: [] Weight loss  [] Fever  [] Chills Cardiac: [] Chest pain   [] Chest pressure   [] Palpitations   [] Shortness of breath when laying flat   [] Shortness of breath with exertion. Vascular:  [x] Pain in legs with walking   [] Pain in legs at rest  [] History of DVT   [] Phlebitis   [] Swelling in legs   [] Varicose veins   [] Non-healing ulcers Pulmonary:   [] Uses home oxygen   [] Productive cough   [] Hemoptysis   [] Wheeze  [] COPD   [] Asthma Neurologic:  [] Dizziness   [] Seizures   [] History of stroke   [] History of TIA  [] Aphasia   [] Vissual changes   [] Weakness or numbness in arm   [] Weakness or numbness in leg Musculoskeletal:   [] Joint swelling   [] Joint pain   [] Low back pain Hematologic:  [] Easy bruising  [] Easy bleeding   [] Hypercoagulable state   [] Anemic Gastrointestinal:  [] Diarrhea   [] Vomiting  [] Gastroesophageal reflux/heartburn   [] Difficulty swallowing. Genitourinary:  [] Chronic kidney disease   [] Difficult urination  [] Frequent urination   [] Blood in urine Skin:  [] Rashes   [] Ulcers  Psychological:  [] History of anxiety   []  History of major depression.  Physical Examination  There were no vitals filed for this visit. There is no height or weight on file to calculate BMI. Gen: WD/WN, NAD Head: [place]/AT, No temporalis wasting.  Ear/Nose/Throat: Hearing grossly intact, nares w/o erythema or drainage Eyes: PER, EOMI, sclera nonicteric.  Neck: Supple, no large masses.   Pulmonary:  Good air movement, no audible wheezing bilaterally, no use of accessory muscles.    Cardiac: RRR, no JVD Vascular:  Bilateral carotid bruits noted Vessel Right Left  Radial Palpable Palpable  PT Not Palpable Not Palpable  DP Not Palpable Not Palpable  Gastrointestinal: Non-distended. No guarding/no peritoneal signs.  Musculoskeletal: M/S 5/5 throughout.  No deformity or atrophy.  Neurologic: CN 2-12 intact. Symmetrical.  Speech is fluent. Motor exam as listed above. Psychiatric: Judgment intact, Mood & affect appropriate for pt's clinical situation. Dermatologic: No rashes or ulcers noted.  No changes consistent with cellulitis. Lymph : No lichenification or skin changes of chronic lymphedema.  CBC Lab Results  Component Value Date   WBC 10.0 04/04/2017   HGB 12.0 04/04/2017   HCT 39.2 04/04/2017  MCV 72 (L) 04/04/2017   PLT 354 04/04/2017    BMET    Component Value Date/Time   NA 141 04/04/2017 0000   NA 141 11/10/2013 0431   K 5.6 (H) 04/04/2017 0000   K 4.2 11/10/2013 0431   CL 98 04/04/2017 0000   CL 112 (H) 11/10/2013 0431   CO2 23 04/04/2017 0000   CO2 24 11/10/2013 0431   GLUCOSE 118 (H) 04/04/2017 0000   GLUCOSE 95 08/17/2016 0525   GLUCOSE 146 (H) 11/10/2013 0431   BUN 31 (H) 04/04/2017 0000   BUN 14 11/10/2013 0431   CREATININE 1.00 06/29/2017 1001   CREATININE 0.73 11/10/2013 0431   CALCIUM 9.6 04/04/2017 0000   CALCIUM 7.9 (L) 11/10/2013 0431   GFRNONAA 45 (L) 04/04/2017 0000   GFRNONAA >60 11/10/2013 0431   GFRNONAA >60 04/27/2012 1832   GFRAA 52 (L) 04/04/2017 0000   GFRAA >60 11/10/2013 0431   GFRAA >60 04/27/2012 1832   CrCl cannot be calculated (Unknown ideal weight.).  COAG Lab Results  Component Value Date   INR 1.57 08/16/2016   INR 1.50 06/06/2015   INR 0.98 07/04/2014    Radiology Ct Angio Ao+bifem W & Or Wo Contrast  Result Date: 06/29/2017 CLINICAL DATA:  Lower extremity pain. EXAM: CT ANGIOGRAPHY OF ABDOMINAL AORTA WITH ILIOFEMORAL RUNOFF TECHNIQUE: Multidetector CT imaging of the abdomen, pelvis and lower  extremities was performed using the standard protocol during bolus administration of intravenous contrast. Multiplanar CT image reconstructions and MIPs were obtained to evaluate the vascular anatomy. CONTRAST:  139mL ISOVUE-370 IOPAMIDOL (ISOVUE-370) INJECTION 76% COMPARISON:  None. FINDINGS: VASCULAR Aorta: Atherosclerotic calcifications of the abdominal aorta are noted. There is some predominately smooth soft plaque throughout the abdominal aorta. No evidence of aneurysm. No significant luminal narrowing. Celiac: Patent. SMA: Patent. Renals: 2 right renal arteries are diminutive and patent. A single left renal artery with early branching is patent. IMA: Patent. RIGHT Lower Extremity Inflow: Atherosclerotic calcifications of the right common iliac artery are noted. No significant luminal narrowing. Right internal iliac artery is occluded beyond its takeoff. Small branches reconstitute. Right external iliac artery is patent. Outflow: Mild atherosclerotic soft and calcified plaque in the right common femoral artery without significant narrowing. Profunda femoral artery branches are patent. Superficial femoral artery is patent. Runoff: Popliteal artery is patent.  3 vessel runoff to the ankle. LEFT Lower Extremity Inflow: There is a stent in the left common iliac artery extending from its origin to the distal left common iliac artery. The stent is widely patent. Internal and external iliac arteries are patent. Outflow: The common femoral, profunda femoral, and superficial femoral arteries are patent. Runoff: Popliteal artery is patent. There is 3 vessel runoff to the ankle. Review of the MIP images confirms the above findings. NON-VASCULAR Lower chest: Mild dependent atelectasis.  Right atrium is dilated. Hepatobiliary: No focal mass. There is reflux of contrast into the hepatic veins suggesting right heart malfunction. Postcholecystectomy. Pancreas: Unremarkable Spleen: Unremarkable Adrenals/Urinary Tract: Adrenal  glands are within normal limits. Simple cyst in the upper pole of the right kidney. Scarring of the right kidney is noted. Left kidney is minimally scarred. Tiny simple cyst in the mid left kidney. Bladder is decompressed. Stomach/Bowel: No obvious mass in the colon. No evidence of small-bowel obstruction. Stomach is decompressed. Lymphatic: No abnormal retroperitoneal adenopathy. Reproductive: Uterus is absent.  Adnexa are within normal limits. Other: No free fluid. Musculoskeletal: No vertebral compression deformity. Lumbar degenerative disc disease. IMPRESSION: VASCULAR Aorta, common iliac,  and external iliac arteries are patent. A stent in the left common iliac artery is widely patent. Right internal iliac artery occlusion.  Branches reconstitute. Femoral and popliteal arterial system is patent bilaterally with 3 vessel runoff. NON-VASCULAR No acute process. Electronically Signed   By: Marybelle Killings M.D.   On: 06/29/2017 10:56      Assessment/Plan 1. PAD (peripheral artery disease) (HCC)  Recommend:  The patient has evidence of atherosclerosis of the lower extremities with claudication.  The patient does not voice lifestyle limiting changes at this point in time.  Noninvasive studies do not suggest clinically significant change.  No invasive studies, angiography or surgery at this time The patient should continue walking and begin a more formal exercise program.  The patient should continue antiplatelet therapy and aggressive treatment of the lipid abnormalities  No changes in the patient's medications at this time  The patient should continue wearing graduated compression socks 10-15 mmHg strength to control the mild edema.   - VAS [place] ABI WITH/WO TBI; Future  2. Bilateral carotid artery stenosis Recommend:  Given the patient's asymptomatic subcritical stenosis no further invasive testing or surgery at this time.  Continue antiplatelet therapy as prescribed Continue management of CAD,  HTN and Hyperlipidemia Healthy heart diet,  encouraged exercise at least 4 times per week Follow up in 12 months with duplex ultrasound and physical exam   3. Chronic atrial fibrillation (HCC) Continue antiarrhythmia medications as already ordered, these medications have been reviewed and there are no changes at this time.  Continue anticoagulation as ordered by Cardiology Service   4. Gastro-esophageal reflux disease without esophagitis Continue antihypertensive medications as already ordered, these medications have been reviewed and there are no changes at this time.  Avoidence of caffeine and alcohol  Moderate elevation of the head of the bed   5. Hyperlipidemia, unspecified hyperlipidemia type Continue statin as ordered and reviewed, no changes at this time    Hortencia Pilar, MD  07/11/2017 1:13 PM

## 2017-07-16 ENCOUNTER — Encounter (INDEPENDENT_AMBULATORY_CARE_PROVIDER_SITE_OTHER): Payer: Self-pay | Admitting: Vascular Surgery

## 2017-07-27 ENCOUNTER — Other Ambulatory Visit: Payer: Self-pay | Admitting: Physician Assistant

## 2017-07-27 DIAGNOSIS — Z8679 Personal history of other diseases of the circulatory system: Secondary | ICD-10-CM

## 2017-09-11 ENCOUNTER — Other Ambulatory Visit: Payer: Self-pay | Admitting: Physician Assistant

## 2017-09-11 DIAGNOSIS — F3341 Major depressive disorder, recurrent, in partial remission: Secondary | ICD-10-CM

## 2017-09-11 DIAGNOSIS — E876 Hypokalemia: Secondary | ICD-10-CM

## 2017-11-02 DIAGNOSIS — I482 Chronic atrial fibrillation, unspecified: Secondary | ICD-10-CM | POA: Diagnosis not present

## 2017-11-02 DIAGNOSIS — I10 Essential (primary) hypertension: Secondary | ICD-10-CM | POA: Diagnosis not present

## 2017-11-02 DIAGNOSIS — E782 Mixed hyperlipidemia: Secondary | ICD-10-CM | POA: Diagnosis not present

## 2017-11-02 DIAGNOSIS — I872 Venous insufficiency (chronic) (peripheral): Secondary | ICD-10-CM | POA: Diagnosis not present

## 2017-12-07 ENCOUNTER — Other Ambulatory Visit: Payer: Self-pay | Admitting: Physician Assistant

## 2017-12-07 DIAGNOSIS — I10 Essential (primary) hypertension: Secondary | ICD-10-CM

## 2018-01-09 ENCOUNTER — Other Ambulatory Visit: Payer: Self-pay | Admitting: Family Medicine

## 2018-01-09 ENCOUNTER — Other Ambulatory Visit: Payer: Self-pay | Admitting: Physician Assistant

## 2018-01-09 DIAGNOSIS — Z8679 Personal history of other diseases of the circulatory system: Secondary | ICD-10-CM

## 2018-01-09 DIAGNOSIS — K219 Gastro-esophageal reflux disease without esophagitis: Secondary | ICD-10-CM

## 2018-01-09 DIAGNOSIS — I63232 Cerebral infarction due to unspecified occlusion or stenosis of left carotid arteries: Secondary | ICD-10-CM

## 2018-02-15 ENCOUNTER — Encounter (INDEPENDENT_AMBULATORY_CARE_PROVIDER_SITE_OTHER): Payer: Medicare Other

## 2018-02-15 ENCOUNTER — Ambulatory Visit (INDEPENDENT_AMBULATORY_CARE_PROVIDER_SITE_OTHER): Payer: Medicare Other | Admitting: Vascular Surgery

## 2018-03-09 ENCOUNTER — Encounter: Payer: Self-pay | Admitting: *Deleted

## 2018-03-15 ENCOUNTER — Ambulatory Visit: Payer: Self-pay

## 2018-03-16 ENCOUNTER — Telehealth: Payer: Self-pay

## 2018-03-16 NOTE — Telephone Encounter (Signed)
Pt called and LM on my VM earlier stating that she needed to cancel her upcoming apt and will r/s. Caller her back and left a VM requesting a CB to r/s this apt. 03/22/18 AWV cancelled. -MM

## 2018-03-18 ENCOUNTER — Telehealth: Payer: Self-pay

## 2018-03-18 NOTE — Telephone Encounter (Signed)
Call pt regarding lung screening. Left message for pt to return call.  

## 2018-03-22 ENCOUNTER — Ambulatory Visit: Payer: Medicare Other

## 2018-03-23 ENCOUNTER — Encounter: Payer: Self-pay | Admitting: *Deleted

## 2018-03-29 NOTE — Telephone Encounter (Signed)
Called pt to inquire about completing AWV over the phone due to COVID restriction and pt accepted and scheduled an apt for 04/03/18 @ 11:20 AM.  -MM

## 2018-04-03 ENCOUNTER — Ambulatory Visit: Payer: Medicare Other

## 2018-04-08 ENCOUNTER — Other Ambulatory Visit: Payer: Self-pay | Admitting: Physician Assistant

## 2018-04-08 DIAGNOSIS — Z8679 Personal history of other diseases of the circulatory system: Secondary | ICD-10-CM

## 2018-04-08 DIAGNOSIS — F3341 Major depressive disorder, recurrent, in partial remission: Secondary | ICD-10-CM

## 2018-05-15 ENCOUNTER — Other Ambulatory Visit: Payer: Self-pay | Admitting: Physician Assistant

## 2018-05-15 DIAGNOSIS — I10 Essential (primary) hypertension: Secondary | ICD-10-CM

## 2018-05-16 ENCOUNTER — Telehealth: Payer: Self-pay | Admitting: *Deleted

## 2018-05-16 NOTE — Telephone Encounter (Signed)
Left message for patient to notify them that it is time to schedule annual low dose lung cancer screening CT scan. Instructed patient to call back to verify information prior to the scan being scheduled.  

## 2018-06-22 ENCOUNTER — Encounter: Payer: Self-pay | Admitting: Physician Assistant

## 2018-06-22 DIAGNOSIS — N6489 Other specified disorders of breast: Secondary | ICD-10-CM | POA: Diagnosis not present

## 2018-06-22 LAB — HM MAMMOGRAPHY

## 2018-06-23 ENCOUNTER — Telehealth: Payer: Self-pay

## 2018-06-23 NOTE — Telephone Encounter (Signed)
Call pt regarding lung screening. Left message for pt to return call.  

## 2018-06-26 ENCOUNTER — Encounter: Payer: Self-pay | Admitting: Physician Assistant

## 2018-07-03 ENCOUNTER — Other Ambulatory Visit: Payer: Self-pay | Admitting: Physician Assistant

## 2018-07-03 DIAGNOSIS — Z8679 Personal history of other diseases of the circulatory system: Secondary | ICD-10-CM

## 2018-07-03 DIAGNOSIS — K219 Gastro-esophageal reflux disease without esophagitis: Secondary | ICD-10-CM

## 2018-07-10 ENCOUNTER — Encounter (INDEPENDENT_AMBULATORY_CARE_PROVIDER_SITE_OTHER): Payer: Self-pay | Admitting: Vascular Surgery

## 2018-07-10 ENCOUNTER — Other Ambulatory Visit: Payer: Self-pay

## 2018-07-10 ENCOUNTER — Ambulatory Visit (INDEPENDENT_AMBULATORY_CARE_PROVIDER_SITE_OTHER): Payer: Medicare Other

## 2018-07-10 ENCOUNTER — Telehealth (INDEPENDENT_AMBULATORY_CARE_PROVIDER_SITE_OTHER): Payer: Self-pay | Admitting: Vascular Surgery

## 2018-07-10 ENCOUNTER — Ambulatory Visit (INDEPENDENT_AMBULATORY_CARE_PROVIDER_SITE_OTHER): Payer: Medicare Other | Admitting: Vascular Surgery

## 2018-07-10 VITALS — BP 145/62 | HR 88 | Resp 18 | Ht 65.0 in | Wt 215.0 lb

## 2018-07-10 DIAGNOSIS — I6523 Occlusion and stenosis of bilateral carotid arteries: Secondary | ICD-10-CM | POA: Diagnosis not present

## 2018-07-10 DIAGNOSIS — I739 Peripheral vascular disease, unspecified: Secondary | ICD-10-CM

## 2018-07-10 DIAGNOSIS — Z7902 Long term (current) use of antithrombotics/antiplatelets: Secondary | ICD-10-CM | POA: Diagnosis not present

## 2018-07-10 DIAGNOSIS — K219 Gastro-esophageal reflux disease without esophagitis: Secondary | ICD-10-CM

## 2018-07-10 DIAGNOSIS — E782 Mixed hyperlipidemia: Secondary | ICD-10-CM

## 2018-07-10 DIAGNOSIS — Z87891 Personal history of nicotine dependence: Secondary | ICD-10-CM | POA: Diagnosis not present

## 2018-07-10 DIAGNOSIS — Z79899 Other long term (current) drug therapy: Secondary | ICD-10-CM

## 2018-07-10 DIAGNOSIS — M5136 Other intervertebral disc degeneration, lumbar region: Secondary | ICD-10-CM

## 2018-07-10 MED ORDER — TRAMADOL HCL 50 MG PO TABS: 50.0000 mg | ORAL_TABLET | Freq: Two times a day (BID) | ORAL | 0 refills | Status: DC | PRN

## 2018-07-10 NOTE — Telephone Encounter (Signed)
Patient called asking for RX that Schnier said he was going to send it to be sent to the pharmacy. I spoke with Schnier, he advised he was going to send it in. Spoke with patient and advised her. She verbalized understanding. AS, CMA

## 2018-07-23 ENCOUNTER — Encounter (INDEPENDENT_AMBULATORY_CARE_PROVIDER_SITE_OTHER): Payer: Self-pay | Admitting: Vascular Surgery

## 2018-07-23 NOTE — Progress Notes (Signed)
MRN : [id number]  Kayla Maxwell is a 73 y.o. (Mar 19, 1945) female who presents with chief complaint of  Chief Complaint  Patient presents with  . Follow-up    ultrasound follow up  .  History of Present Illness:   The patient returns to the office for followup and review of the noninvasive studies. There have been no interval changes in lower extremity symptoms. No interval shortening of the patient's claudication distance or development of rest pain symptoms. No new ulcers or wounds have occurred since the last visit.  There have been no significant changes to the patient's overall health care.  The patient denies amaurosis fugax or recent TIA symptoms. There are no recent neurological changes noted. The patient denies history of DVT, PE or superficial thrombophlebitis. The patient denies recent episodes of angina or shortness of breath.   ABI Rt=1.00 and Lt=1.03  Current Meds  Medication Sig  . albuterol (PROVENTIL HFA;VENTOLIN HFA) 108 (90 Base) MCG/ACT inhaler Inhale 2 puffs into the lungs every 6 (six) hours as needed for wheezing or shortness of breath.  Marland Kitchen aspirin EC 81 MG EC tablet Take 1 tablet (81 mg total) by mouth daily.  Marland Kitchen atorvastatin (LIPITOR) 40 MG tablet TAKE 1 TABLET BY MOUTH IN THE EVENING  . cetirizine (ZYRTEC) 10 MG tablet Take 10 mg by mouth daily as needed for allergies.   Marland Kitchen FLUoxetine (PROZAC) 20 MG capsule TAKE 1 CAPSULE BY MOUTH EVERY DAY  . furosemide (LASIX) 40 MG tablet TAKE 1 TABLET BY MOUTH EVERY DAY  . ibuprofen (ADVIL,MOTRIN) 600 MG tablet Take 600 mg by mouth every 6 (six) hours as needed for moderate pain.   Marland Kitchen KLOR-CON M20 20 MEQ tablet TAKE 1 TABLET BY MOUTH EVERY DAY  . lisinopril (PRINIVIL,ZESTRIL) 2.5 MG tablet TAKE 1 TABLET BY MOUTH EVERY DAY  . metoprolol tartrate (LOPRESSOR) 25 MG tablet TAKE 2 TABLETS BY MOUTH TWICE A DAY  . montelukast (SINGULAIR) 10 MG tablet Take 1 tablet (10 mg total) by mouth at bedtime. (Patient taking  differently: Take 10 mg by mouth daily as needed (allergies). )  . omeprazole (PRILOSEC) 40 MG capsule TAKE 1 CAPSULE BY MOUTH EVERY DAY  . Potassium Chloride ER 20 MEQ TBCR Take 1 tablet by mouth daily.  . rivaroxaban (XARELTO) 20 MG TABS tablet Take 20 mg by mouth daily with supper.    Past Medical History:  Diagnosis Date  . Acid reflux    takes Nexium daily  . Allergy    takes Singulair daily as needed  . Anemia   . Atrial fibrillation ([place])   . Cancer ([place]) AGE 73   CERVICAL cancer with removal  . Chronic back pain    stenosis  . Complication of anesthesia   . Constipation    takes Colace daily as needed  . Coronary artery disease   . Difficult intubation   . Dysrhythmia    atrial fibrillation dx 04/2014  . Headache    sinus   . History of blood transfusion   . History of shingles   . Hyperlipidemia    takes Pravastatin daily-started taking this 6 months ago  . Hypertension   . Lumbar surgical wound fluid collection   . Sleep apnea    CPAP  . Stroke ([place])    TIA'S X 2 --- one last yr and one this yr...lost her memory over 4-6 hrs.  . Stroke ([place]) 06-04-15   left with right side weakness  . TIA (transient ischemic  attack)    showed up on an MRI but she never knew it  . Urinary frequency     Past Surgical History:  Procedure Laterality Date  . ABDOMINAL HYSTERECTOMY    . APPENDECTOMY  1963  . BACK SURGERY  08/19/2014   Dr. Hal Neer ([place] [facility])  . BREAST SURGERY  1984   augementation  . [place]  . CHOLECYSTECTOMY  2014   Dr Jamal Collin  . COLONOSCOPY    . ESOPHAGOGASTRODUODENOSCOPY    . HAMMER TOE SURGERY Bilateral   . LUMBAR LAMINECTOMY WITH COFLEX 1 LEVEL Bilateral 07/04/2014   Procedure: Laminectomy and Foraminotomy - bilateral - Lumbar Four-Five with coflex ;  Surgeon: Karie Chimera, MD;  Location: [facility];  Service: Neurosurgery;  Laterality: Bilateral;  . LUMBAR WOUND DEBRIDEMENT N/A 08/19/2014   Procedure: Exploration of Lumbar  wound;  Surgeon: Karie Chimera, MD;  Location: [facility];  Service: Neurosurgery;  Laterality: N/A;  . PERIPHERAL VASCULAR CATHETERIZATION Left 08/12/2015   Procedure: Lower Extremity Angiography;  Surgeon: Katha Cabal, MD;  Location: [facility];  Service: Cardiovascular;  Laterality: Left;  . TONSILLECTOMY    . VENTRAL HERNIA REPAIR N/A 05/26/2015   Procedure: HERNIA REPAIR VENTRAL ADULT;  Surgeon: Christene Lye, MD;  Location: [facility];  Service: General;  Laterality: N/A;    Social History Social History   Tobacco Use  . Smoking status: Former Smoker    Packs/day: 1.00    Years: 47.00    Pack years: 47.00    Quit date: 08/19/2014    Years since quitting: 3.9  . Smokeless tobacco: Former Systems developer    Quit date: 08/19/2014  Substance Use Topics  . Alcohol use: No  . Drug use: No    Family History Family History  Problem Relation Age of Onset  . Alzheimer's disease Mother   . Heart attack Father   . Pancreatic cancer Sister   . Healthy Brother     Allergies  Allergen Reactions  . Codeine Other (See Comments)    Makes her feel "crazy"  . Penicillins Swelling    Has patient had a PCN reaction causing immediate rash, facial/tongue/throat swelling, SOB or lightheadedness with hypotension: unknown Has patient had a PCN reaction causing severe rash involving mucus membranes or skin necrosis: unknown Has patient had a PCN reaction that required hospitalization unknown Has patient had a PCN reaction occurring within the last 10 years: no If all of the above answers are "NO", then may proceed with Cephalosporin use.  . Adhesive [Tape] Hives  . Sulfa Antibiotics Nausea Only and Rash     REVIEW OF SYSTEMS (Negative unless checked)  Constitutional: [] Weight loss  [] Fever  [] Chills Cardiac: [] Chest pain   [] Chest pressure   [] Palpitations   [] Shortness of breath when laying flat   [] Shortness of breath with exertion. Vascular:  [x] Pain in legs with walking    [] Pain in legs at rest  [] History of DVT   [] Phlebitis   [] Swelling in legs   [] Varicose veins   [] Non-healing ulcers Pulmonary:   [] Uses home oxygen   [] Productive cough   [] Hemoptysis   [] Wheeze  [] COPD   [] Asthma Neurologic:  [] Dizziness   [] Seizures   [] History of stroke   [] History of TIA  [] Aphasia   [] Vissual changes   [] Weakness or numbness in arm   [] Weakness or numbness in leg Musculoskeletal:   [] Joint swelling   [] Joint pain   [] Low back pain Hematologic:  [] Easy  bruising  [] Easy bleeding   [] Hypercoagulable state   [] Anemic Gastrointestinal:  [] Diarrhea   [] Vomiting  [x] Gastroesophageal reflux/heartburn   [] Difficulty swallowing. Genitourinary:  [] Chronic kidney disease   [] Difficult urination  [] Frequent urination   [] Blood in urine Skin:  [] Rashes   [] Ulcers  Psychological:  [] History of anxiety   []  History of major depression.  Physical Examination  Vitals:   07/10/18 1342  BP: (!) 145/62  Pulse: 88  Resp: 18  Weight: 215 lb (97.5 kg)  Height: 5\' 5"  (1.651 m)   Body mass index is 35.78 kg/m. Gen: WD/WN, NAD Head: [place]/AT, No temporalis wasting.  Ear/Nose/Throat: Hearing grossly intact, nares w/o erythema or drainage Eyes: PER, EOMI, sclera nonicteric.  Neck: Supple, no large masses.   Pulmonary:  Good air movement, no audible wheezing bilaterally, no use of accessory muscles.  Cardiac: RRR, no JVD Vascular:  Vessel Right Left  Radial Palpable Palpable  PT Trace Palpable Not Palpable  DP Not Palpable Trace Palpable  Gastrointestinal: Non-distended. No guarding/no peritoneal signs.  Musculoskeletal: M/S 5/5 throughout.  No deformity or atrophy.  Neurologic: CN 2-12 intact. Symmetrical.  Speech is fluent. Motor exam as listed above. Psychiatric: Judgment intact, Mood & affect appropriate for pt's clinical situation. Dermatologic: No rashes or ulcers noted.  No changes consistent with cellulitis. Lymph : No lichenification or skin changes of chronic lymphedema.   CBC Lab Results  Component Value Date   WBC 10.0 04/04/2017   HGB 12.0 04/04/2017   HCT 39.2 04/04/2017   MCV 72 (L) 04/04/2017   PLT 354 04/04/2017    BMET    Component Value Date/Time   NA 141 04/04/2017 0000   NA 141 11/10/2013 0431   K 5.6 (H) 04/04/2017 0000   K 4.2 11/10/2013 0431   CL 98 04/04/2017 0000   CL 112 (H) 11/10/2013 0431   CO2 23 04/04/2017 0000   CO2 24 11/10/2013 0431   GLUCOSE 118 (H) 04/04/2017 0000   GLUCOSE 95 08/17/2016 0525   GLUCOSE 146 (H) 11/10/2013 0431   BUN 31 (H) 04/04/2017 0000   BUN 14 11/10/2013 0431   CREATININE 1.00 06/29/2017 1001   CREATININE 0.73 11/10/2013 0431   CALCIUM 9.6 04/04/2017 0000   CALCIUM 7.9 (L) 11/10/2013 0431   GFRNONAA 45 (L) 04/04/2017 0000   GFRNONAA >60 11/10/2013 0431   GFRNONAA >60 04/27/2012 1832   GFRAA 52 (L) 04/04/2017 0000   GFRAA >60 11/10/2013 0431   GFRAA >60 04/27/2012 1832   CrCl cannot be calculated (Patient's most recent lab result is older than the maximum 21 days allowed.).  COAG Lab Results  Component Value Date   INR 1.57 08/16/2016   INR 1.50 06/06/2015   INR 0.98 07/04/2014    Radiology Vas [place] Abi With/wo Tbi  Result Date: 07/10/2018 LOWER EXTREMITY DOPPLER STUDY  Vascular Interventions: 08/12/2015: PTA of the Right Common Iliac Artery. PTA and                         Plasty/Stent placement of the Left Common Iliac Artery. Comparison Study: 02/16/2017 Performing Technologist: Almira Coaster RVS  Examination Guidelines: A complete evaluation includes at minimum, Doppler waveform signals and systolic blood pressure reading at the level of bilateral brachial, anterior tibial, and posterior tibial arteries, when vessel segments are accessible. Bilateral testing is considered an integral part of a complete examination. Photoelectric Plethysmograph (PPG) waveforms and toe systolic pressure readings are included as required and additional duplex testing  as needed. Limited examinations for  reoccurring indications may be performed as noted.  ABI Findings: +---------+------------------+-----+---------+--------+ Right    Rt Pressure (mmHg)IndexWaveform Comment  +---------+------------------+-----+---------+--------+ Brachial 188                                      +---------+------------------+-----+---------+--------+ ATA      198               0.98 biphasic          +---------+------------------+-----+---------+--------+ PTA      204               1.00 triphasic         +---------+------------------+-----+---------+--------+ Great Toe173               0.85 Normal            +---------+------------------+-----+---------+--------+ +---------+------------------+-----+---------+-------+ Left     Lt Pressure (mmHg)IndexWaveform Comment +---------+------------------+-----+---------+-------+ Brachial 203                                     +---------+------------------+-----+---------+-------+ ATA      200               0.99 triphasic        +---------+------------------+-----+---------+-------+ PTA      210               1.03 biphasic         +---------+------------------+-----+---------+-------+ Great Toe189               0.93 Normal           +---------+------------------+-----+---------+-------+ +-------+-----------+-----------+------------+------------+ ABI/TBIToday's ABIToday's TBIPrevious ABIPrevious TBI +-------+-----------+-----------+------------+------------+ Right  1.00       0.85       1.27        .79          +-------+-----------+-----------+------------+------------+ Left   1.03       .93        1.09        .85          +-------+-----------+-----------+------------+------------+ Bilateral ABIs appear essentially unchanged compared to prior study on 02/16/2017. Bilateral TBIs appear increased compared to prior study on 02/16/2017.  Summary: Right: Resting right ankle-brachial index is within normal range. No evidence  of significant right lower extremity arterial disease. The right toe-brachial index is normal. Left: Resting left ankle-brachial index is within normal range. No evidence of significant left lower extremity arterial disease. The left toe-brachial index is normal.  *See table(s) above for measurements and observations.  Electronically signed by Hortencia Pilar MD on 07/10/2018 at 4:47:29 PM.    Final      Assessment/Plan 1. PAD (peripheral artery disease) (HCC) Recommend:  The patient has evidence of atherosclerosis of the lower extremities with claudication.  The patient does not voice lifestyle limiting changes at this point in time.  Noninvasive studies do not suggest clinically significant change.  No invasive studies, angiography or surgery at this time The patient should continue walking and begin a more formal exercise program.  The patient should continue antiplatelet therapy and aggressive treatment of the lipid abnormalities  No changes in the patient's medications at this time  The patient should continue wearing graduated compression socks 10-15 mmHg strength to control the mild edema. - VAS US AORTA/IVC/ILIACS; Future - VAS [place] ABI  WITH/WO TBI; Future  2. Bilateral carotid artery stenosis Recommend:  Given the patient's asymptomatic subcritical stenosis no further invasive testing or surgery at this time.  Continue antiplatelet therapy as prescribed Continue management of CAD, HTN and Hyperlipidemia Healthy heart diet,  encouraged exercise at least 4 times per week Follow up in 12 months with duplex ultrasound and physical exam   3. Gastro-esophageal reflux disease without esophagitis Continue PPI as already ordered, this medication has been reviewed and there are no changes at this time.  Avoidence of caffeine and alcohol  Moderate elevation of the head of the bed   4. DDD (degenerative disc disease), lumbar Continue NSAID medications as already ordered, these  medications have been reviewed and there are no changes at this time.  Continued activity and therapy was stressed.   5. Mixed hyperlipidemia Continue statin as ordered and reviewed, no changes at this time     Hortencia Pilar, MD  07/23/2018 4:24 PM

## 2018-08-04 ENCOUNTER — Other Ambulatory Visit: Payer: Self-pay

## 2018-08-13 ENCOUNTER — Other Ambulatory Visit: Payer: Self-pay | Admitting: Physician Assistant

## 2018-08-13 DIAGNOSIS — I10 Essential (primary) hypertension: Secondary | ICD-10-CM

## 2018-08-21 NOTE — Progress Notes (Signed)
Subjective:   Arlena Marsan Inniss is a 73 y.o. female who presents for Medicare Annual (Subsequent) preventive examination.    This visit is being conducted through telemedicine due to the COVID-19 pandemic. This patient has given me verbal consent via doximity to conduct this visit, patient states they are participating from their home address. Some vital signs may be absent or patient reported.    Patient identification: identified by name, DOB, and current address  Review of Systems:  N/A  Cardiac Risk Factors include: advanced age (>37men, >66 women);dyslipidemia;hypertension     Objective:     Vitals: There were no vitals taken for this visit.  There is no height or weight on file to calculate BMI. Unable to obtain vitals due to visit being conducted via telephonically.   Advanced Directives 08/22/2018 03/09/2017 08/16/2016 08/16/2016 01/14/2016 08/12/2015 06/09/2015  Does Patient Have a Medical Advance Directive? Yes Yes Yes Yes Yes Yes Yes  Type of Paramedic of [place];Living will [place];Living will [place];Living will [place];Living will Living will;Healthcare Power of Attorney - Living will;Healthcare Power of Attorney  Does patient want to make changes to medical advance directive? - - No - Patient declined - - - -  Copy of [place] in Chart? - Yes Yes Yes - No - copy requested -  Would patient like information on creating a medical advance directive? - - - - - - -    Tobacco Social History   Tobacco Use  Smoking Status Former Smoker  . Packs/day: 1.00  . Years: 47.00  . Pack years: 47.00  . Quit date: 08/19/2014  . Years since quitting: 4.0  Smokeless Tobacco Former Systems developer  . Quit date: 08/19/2014     Counseling given: Not Answered   Clinical Intake:  Pre-visit preparation completed: Yes  Pain : 0-10 Pain Score: 5  Pain Location: Foot Pain Orientation: Right,  Left Pain Descriptors / Indicators: Burning Pain Frequency: Constant     Nutritional Risks: None Diabetes: No  How often do you need to have someone help you when you read instructions, pamphlets, or other written materials from your doctor or pharmacy?: 1 - Never  Interpreter Needed?: No  Information entered by :: [facility], LPN  Past Medical History:  Diagnosis Date  . Acid reflux    takes Nexium daily  . Allergy    takes Singulair daily as needed  . Anemia   . Atrial fibrillation ([place])   . Cancer ([place]) AGE 34   CERVICAL cancer with removal  . Chronic back pain    stenosis  . Complication of anesthesia   . Constipation    takes Colace daily as needed  . Coronary artery disease   . Difficult intubation   . Dysrhythmia    atrial fibrillation dx 04/2014  . Headache    sinus   . History of blood transfusion   . History of shingles   . Hyperlipidemia    takes Pravastatin daily-started taking this 6 months ago  . Hypertension   . Lumbar surgical wound fluid collection   . Sleep apnea    CPAP  . Stroke ([place])    TIA'S X 2 --- one last yr and one this yr...lost her memory over 4-6 hrs.  . Stroke ([place]) 06-04-15   left with right side weakness  . TIA (transient ischemic attack)    showed up on an MRI but she never knew it  .  Urinary frequency    Past Surgical History:  Procedure Laterality Date  . ABDOMINAL HYSTERECTOMY    . APPENDECTOMY  1963  . BACK SURGERY  08/19/2014   Dr. Hal Neer ( otho)  . BREAST SURGERY  1984   augementation  . [place]  . CHOLECYSTECTOMY  2014   Dr Jamal Collin  . COLONOSCOPY    . ESOPHAGOGASTRODUODENOSCOPY    . HAMMER TOE SURGERY Bilateral   . LUMBAR LAMINECTOMY WITH COFLEX 1 LEVEL Bilateral 07/04/2014   Procedure: Laminectomy and Foraminotomy - bilateral - Lumbar Four-Five with coflex ;  Surgeon: Karie Chimera, MD;  Location: [place] NEURO ORS;  Service: Neurosurgery;  Laterality: Bilateral;  . LUMBAR WOUND DEBRIDEMENT N/A  08/19/2014   Procedure: Exploration of Lumbar wound;  Surgeon: Karie Chimera, MD;  Location: [place] NEURO ORS;  Service: Neurosurgery;  Laterality: N/A;  . PERIPHERAL VASCULAR CATHETERIZATION Left 08/12/2015   Procedure: Lower Extremity Angiography;  Surgeon: Katha Cabal, MD;  Location: [place] CV LAB;  Service: Cardiovascular;  Laterality: Left;  . TONSILLECTOMY    . VENTRAL HERNIA REPAIR N/A 05/26/2015   Procedure: HERNIA REPAIR VENTRAL ADULT;  Surgeon: Christene Lye, MD;  Location: [facility] ORS;  Service: General;  Laterality: N/A;   Family History  Problem Relation Age of Onset  . Alzheimer's disease Mother   . Heart attack Father   . Pancreatic cancer Sister   . Healthy Brother    Social History   Socioeconomic History  . Marital status: Divorced    Spouse name: Not on file  . Number of children: 1  . Years of education: College  . Highest education level: Some college, no degree  Occupational History  . Occupation: Retired  Scientific laboratory technician  . Financial resource strain: Not hard at all  . Food insecurity    Worry: Never true    Inability: Never true  . Transportation needs    Medical: No    Non-medical: No  Tobacco Use  . Smoking status: Former Smoker    Packs/day: 1.00    Years: 47.00    Pack years: 47.00    Quit date: 08/19/2014    Years since quitting: 4.0  . Smokeless tobacco: Former Systems developer    Quit date: 08/19/2014  Substance and Sexual Activity  . Alcohol use: No  . Drug use: No  . Sexual activity: Not on file  Lifestyle  . Physical activity    Days per week: 0 days    Minutes per session: 0 min  . Stress: Not at all  Relationships  . Social Herbalist on phone: Patient refused    Gets together: Patient refused    Attends religious service: Patient refused    Active member of club or organization: Patient refused    Attends meetings of clubs or organizations: Patient refused    Relationship status: Patient refused  Other Topics Concern   . Not on file  Social History Narrative  . Not on file    Outpatient Encounter Medications as of 08/22/2018  Medication Sig  . albuterol (PROVENTIL HFA;VENTOLIN HFA) 108 (90 Base) MCG/ACT inhaler Inhale 2 puffs into the lungs every 6 (six) hours as needed for wheezing or shortness of breath.  Marland Kitchen aspirin EC 81 MG EC tablet Take 1 tablet (81 mg total) by mouth daily.  Marland Kitchen atorvastatin (LIPITOR) 40 MG tablet TAKE 1 TABLET BY MOUTH IN THE EVENING  . cetirizine (ZYRTEC) 10 MG tablet Take 10 mg by mouth daily  as needed for allergies.   Marland Kitchen FLUoxetine (PROZAC) 20 MG capsule TAKE 1 CAPSULE BY MOUTH EVERY DAY  . furosemide (LASIX) 40 MG tablet TAKE 1 TABLET BY MOUTH EVERY DAY  . ibuprofen (ADVIL,MOTRIN) 600 MG tablet Take 600 mg by mouth every 6 (six) hours as needed for moderate pain.   Marland Kitchen lisinopril (PRINIVIL,ZESTRIL) 2.5 MG tablet TAKE 1 TABLET BY MOUTH EVERY DAY  . metoprolol tartrate (LOPRESSOR) 25 MG tablet TAKE 2 TABLETS BY MOUTH TWICE A DAY  . montelukast (SINGULAIR) 10 MG tablet Take 1 tablet (10 mg total) by mouth at bedtime. (Patient taking differently: Take 10 mg by mouth daily as needed (allergies). )  . omeprazole (PRILOSEC) 40 MG capsule TAKE 1 CAPSULE BY MOUTH EVERY DAY  . Potassium Chloride ER 20 MEQ TBCR Take 1 tablet by mouth daily.  . rivaroxaban (XARELTO) 20 MG TABS tablet Take 20 mg by mouth daily with supper.  . traMADol (ULTRAM) 50 MG tablet Take 1 tablet (50 mg total) by mouth every 12 (twelve) hours as needed for moderate pain or severe pain.  Marland Kitchen KLOR-CON M20 20 MEQ tablet TAKE 1 TABLET BY MOUTH EVERY DAY (Patient not taking: Reported on 08/22/2018)   No facility-administered encounter medications on file as of 08/22/2018.     Activities of Daily Living In your present state of health, do you have any difficulty performing the following activities: 08/22/2018  Hearing? N  Vision? N  Difficulty concentrating or making decisions? N  Walking or climbing stairs? N  Comment Due to  hip pain and being unstable.  Dressing or bathing? N  Doing errands, shopping? Y  Comment Does not drive.  Preparing Food and eating ? N  Using the Toilet? N  In the past six months, have you accidently leaked urine? N  Do you have problems with loss of bowel control? N  Managing your Medications? N  Managing your Finances? N  Housekeeping or managing your Housekeeping? N  Some recent data might be hidden    Patient Care Team: Mar Daring, PA-C as PCP - General (Family Medicine) Anell Barr, OD as Consulting Physician (Optometry) Corey Skains, MD as Consulting Physician (Cardiology) Schnier, Dolores Lory, MD as Consulting Physician (Vascular Surgery)    Assessment:   This is a routine wellness examination for Annisa.  Exercise Activities and Dietary recommendations Current Exercise Habits: The patient does not participate in regular exercise at present, Exercise limited by: orthopedic condition(s)  Goals    . Have 3 meals a day     Recommend 3 small meals a day with 2 healthy snacks in between.        Fall Risk: Fall Risk  08/22/2018 03/09/2017 06/07/2016  Falls in the past year? 0 No No    FALL RISK PREVENTION PERTAINING TO THE HOME:  Any stairs in or around the home? No  If so, are there any without handrails? N/A  Home free of loose throw rugs in walkways, pet beds, electrical cords, etc? Yes  Adequate lighting in your home to reduce risk of falls? Yes   ASSISTIVE DEVICES UTILIZED TO PREVENT FALLS:  Life alert? No  Use of a cane, walker or w/c? No  Grab bars in the bathroom? Yes  Shower chair or bench in shower? No  Elevated toilet seat or a handicapped toilet? No    TIMED UP AND GO:  Was the test performed? No .    Depression Screen PHQ 2/9 Scores 08/22/2018 03/09/2017 06/07/2016  PHQ - 2 Score 0 1 1  PHQ- 9 Score - - 7     Cognitive Function     6CIT Screen 08/22/2018 04/04/2017  What Year? 0 points 0 points  What month? 0 points 0 points   What time? 0 points 0 points  Count back from 20 0 points 0 points  Months in reverse 0 points 0 points  Repeat phrase 2 points 4 points  Total Score 2 4    Immunization History  Administered Date(s) Administered  . Influenza Split 02/23/2006, 12/23/2010  . Influenza, High Dose Seasonal PF 10/29/2014  . Pneumococcal Polysaccharide-23 12/23/2010  . Td 09/04/2004    Qualifies for Shingles Vaccine? Yes . Due for Shingrix. Education has been provided regarding the importance of this vaccine. Pt has been advised to call insurance company to determine out of pocket expense. Advised may also receive vaccine at local pharmacy or Health Dept. Verbalized acceptance and understanding.  Tdap: Although this vaccine is not a covered service during a Wellness Exam, does the patient Briley wish to receive this vaccine today?  No .   Flu Vaccine: Due fall 2020  Pneumococcal Vaccine: Due for Pneumococcal vaccine. Does the patient want to receive this vaccine today?  No .   Screening Tests Health Maintenance  Topic Date Due  . Hepatitis C Screening  Jan 27, 1945  . DEXA SCAN  03/29/2007  . PNA vac Low Risk Adult (2 of 2 - PCV13) 12/23/2011  . TETANUS/TDAP  09/05/2014  . COLONOSCOPY  03/20/2017  . INFLUENZA VACCINE  08/05/2018  . MAMMOGRAM  06/21/2020    Cancer Screenings:  Colorectal Screening: Completed 03/21/07. Repeat every 10 years. Declined referral today.   Mammogram: Completed 06/22/18.   Bone Density: Completed 03/29/02. Results reflect  OSTEOPENIA. Repeat every 5 years. Declined referral today.   Lung Cancer Screening: (Low Dose CT Chest recommended if Age 98-80 years, 30 pack-year currently smoking OR have quit w/in 15years.) does qualify, however declines at this time.   Additional Screening:  Hepatitis C Screening: does qualify; however declines future order.   Vision Screening: Recommended annual ophthalmology exams for early detection of glaucoma and other disorders of the eye.   Dental Screening: Recommended annual dental exams for proper oral hygiene  Community Resource Referral:  CRR required this visit?  No       Plan:  I have personally reviewed and addressed the Medicare Annual Wellness questionnaire and have noted the following in the patient's chart:  A. Medical and social history B. Use of alcohol, tobacco or illicit drugs  C. Current medications and supplements D. Functional ability and status E.  Nutritional status F.  Physical activity G. Advance directives H. List of other physicians I.  Hospitalizations, surgeries, and ER visits in previous 12 months J.  [place] such as hearing and vision if needed, cognitive and depression L. Referrals and appointments   In addition, I have reviewed and discussed with patient certain preventive protocols, quality metrics, and best practice recommendations. A written personalized care plan for preventive services as well as general preventive health recommendations were provided to patient. Nurse Health Advisor  Signed,    Oliver Heitzenrater [place], [place]  5/85/2778 Nurse Health Advisor   Nurse Notes: Pt declined a colonoscopy referral, DEXA referral and a future order for a Prevnar 13 vaccine, tetanus vaccine and Hep C lab order.

## 2018-08-22 ENCOUNTER — Ambulatory Visit (INDEPENDENT_AMBULATORY_CARE_PROVIDER_SITE_OTHER): Payer: Medicare Other

## 2018-08-22 ENCOUNTER — Other Ambulatory Visit: Payer: Self-pay

## 2018-08-22 DIAGNOSIS — Z0000 Encounter for general adult medical examination without abnormal findings: Secondary | ICD-10-CM | POA: Diagnosis not present

## 2018-08-22 NOTE — Patient Instructions (Signed)
[name] , Thank you for taking time to come for your Medicare Wellness Visit. I appreciate your ongoing commitment to your health goals. Please review the following plan we discussed and let me know if I can assist you in the future.   Screening recommendations/referrals: Colonoscopy: Currently due, declined referral today. Mammogram: Up to date, due 06/2020 Bone Density: Currently due, declined order today.  Recommended yearly ophthalmology/optometry visit for glaucoma screening and checkup Recommended yearly dental visit for hygiene and checkup  Vaccinations: Influenza vaccine: Due fall 2020 Pneumococcal vaccine: Pt declines today.  Tdap vaccine: Pt declines today.  Shingles vaccine: Pt declines today.     Advanced directives: Currently on file.   Conditions/risks identified: Continue to cut back on portion sizes and eat 3 small meals a day with 2 healthy snack in between.   Next appointment: Declined scheduling a CPE for this year or an AWV for 2021 at this time.    Preventive Care 66 Years and Older, Female Preventive care refers to lifestyle choices and visits with your health care provider that can promote health and wellness. What does preventive care include?  A yearly physical exam. This is also called an annual well check.  Dental exams once or twice a year.  Routine eye exams. Ask your health care provider how often you should have your eyes checked.  Personal lifestyle choices, including:  Daily care of your teeth and gums.  Regular physical activity.  Eating a healthy diet.  Avoiding tobacco and drug use.  Limiting alcohol use.  Practicing safe sex.  Taking low-dose aspirin every day.  Taking vitamin and mineral supplements as recommended by your health care provider. What happens during an annual well check? The services and screenings done by your health care provider during your annual well check will depend on your age, overall health, lifestyle  risk factors, and family history of disease. Counseling  Your health care provider may ask you questions about your:  Alcohol use.  Tobacco use.  Drug use.  Emotional well-being.  Home and relationship well-being.  Sexual activity.  Eating habits.  History of falls.  Memory and ability to understand (cognition).  Work and work Statistician.  Reproductive health. Screening  You may have the following tests or measurements:  Height, weight, and BMI.  Blood pressure.  Lipid and cholesterol levels. These may be checked every 5 years, or more frequently if you are over 27 years old.  Skin check.  Lung cancer screening. You may have this screening every year starting at age 4 if you have a 30-pack-year history of smoking and currently smoke or have quit within the past 15 years.  Fecal occult blood test (FOBT) of the stool. You may have this test every year starting at age 42.  Flexible sigmoidoscopy or colonoscopy. You may have a sigmoidoscopy every 5 years or a colonoscopy every 10 years starting at age 23.  Hepatitis C blood test.  Hepatitis B blood test.  Sexually transmitted disease (STD) testing.  Diabetes screening. This is done by checking your blood sugar (glucose) after you have not eaten for a while (fasting). You may have this done every 1-3 years.  Bone density scan. This is done to screen for osteoporosis. You may have this done starting at age 41.  Mammogram. This may be done every 1-2 years. Talk to your health care provider about how often you should have regular mammograms. Talk with your health care provider about your test results, treatment options, and  if necessary, the need for more tests. Vaccines  Your health care provider may recommend certain vaccines, such as:  Influenza vaccine. This is recommended every year.  Tetanus, diphtheria, and acellular pertussis (Tdap, Td) vaccine. You may need a Td booster every 10 years.  Zoster vaccine.  You may need this after age 1.  Pneumococcal 13-valent conjugate (PCV13) vaccine. One dose is recommended after age 8.  Pneumococcal polysaccharide (PPSV23) vaccine. One dose is recommended after age 58. Talk to your health care provider about which screenings and vaccines you need and how often you need them. This information is not intended to replace advice given to you by your health care provider. Make sure you discuss any questions you have with your health care provider. Document Released: 01/17/2015 Document Revised: 09/10/2015 Document Reviewed: 10/22/2014 Elsevier Interactive Patient Education  2017 [employer] Prevention in the Home Falls can cause injuries. They can happen to people of all ages. There are many things you can do to make your home safe and to help prevent falls. What can I do on the outside of my home?  Regularly fix the edges of walkways and driveways and fix any cracks.  Remove anything that might make you trip as you walk through a door, such as a raised step or threshold.  Trim any bushes or trees on the path to your home.  Use bright outdoor lighting.  Clear any walking paths of anything that might make someone trip, such as rocks or tools.  Regularly check to see if handrails are loose or broken. Make sure that both sides of any steps have handrails.  Any raised decks and porches should have guardrails on the edges.  Have any leaves, snow, or ice cleared regularly.  Use sand or salt on walking paths during winter.  Clean up any spills in your garage right away. This includes oil or grease spills. What can I do in the bathroom?  Use night lights.  Install grab bars by the toilet and in the tub and shower. Do not use towel bars as grab bars.  Use non-skid mats or decals in the tub or shower.  If you need to sit down in the shower, use a plastic, non-slip stool.  Keep the floor dry. Clean up any water that spills on the floor as soon  as it happens.  Remove soap buildup in the tub or shower regularly.  Attach bath mats securely with double-sided non-slip rug tape.  Do not have throw rugs and other things on the floor that can make you trip. What can I do in the bedroom?  Use night lights.  Make sure that you have a light by your bed that is easy to reach.  Do not use any sheets or blankets that are too big for your bed. They should not hang down onto the floor.  Have a firm chair that has side arms. You can use this for support while you get dressed.  Do not have throw rugs and other things on the floor that can make you trip. What can I do in the kitchen?  Clean up any spills right away.  Avoid walking on wet floors.  Keep items that you use a lot in easy-to-reach places.  If you need to reach something above you, use a strong step stool that has a grab bar.  Keep electrical cords out of the way.  Do not use floor polish or wax that makes floors slippery. If you  must use wax, use non-skid floor wax.  Do not have throw rugs and other things on the floor that can make you trip. What can I do with my stairs?  Do not leave any items on the stairs.  Make sure that there are handrails on both sides of the stairs and use them. Fix handrails that are broken or loose. Make sure that handrails are as long as the stairways.  Check any carpeting to make sure that it is firmly attached to the stairs. Fix any carpet that is loose or worn.  Avoid having throw rugs at the top or bottom of the stairs. If you do have throw rugs, attach them to the floor with carpet tape.  Make sure that you have a light switch at the top of the stairs and the bottom of the stairs. If you do not have them, ask someone to add them for you. What else can I do to help prevent falls?  Wear shoes that:  Do not have high heels.  Have rubber bottoms.  Are comfortable and fit you well.  Are closed at the toe. Do not wear sandals.  If  you use a stepladder:  Make sure that it is fully opened. Do not climb a closed stepladder.  Make sure that both sides of the stepladder are locked into place.  Ask someone to hold it for you, if possible.  Clearly mark and make sure that you can see:  Any grab bars or handrails.  First and last steps.  Where the edge of each step is.  Use tools that help you move around (mobility aids) if they are needed. These include:  Canes.  Walkers.  Scooters.  Crutches.  Turn on the lights when you go into a dark area. Replace any light bulbs as soon as they burn out.  Set up your furniture so you have a clear path. Avoid moving your furniture around.  If any of your floors are uneven, fix them.  If there are any pets around you, be aware of where they are.  Review your medicines with your doctor. Some medicines can make you feel dizzy. This can increase your chance of falling. Ask your doctor what other things that you can do to help prevent falls. This information is not intended to replace advice given to you by your health care provider. Make sure you discuss any questions you have with your health care provider. Document Released: 10/17/2008 Document Revised: 05/29/2015 Document Reviewed: 01/25/2014 Elsevier Interactive Patient Education  2017 [employer].

## 2018-09-19 ENCOUNTER — Encounter: Payer: Self-pay | Admitting: *Deleted

## 2018-10-02 ENCOUNTER — Other Ambulatory Visit: Payer: Self-pay | Admitting: Family Medicine

## 2018-10-02 DIAGNOSIS — I63232 Cerebral infarction due to unspecified occlusion or stenosis of left carotid arteries: Secondary | ICD-10-CM

## 2018-11-07 ENCOUNTER — Other Ambulatory Visit: Payer: Self-pay | Admitting: Physician Assistant

## 2018-11-07 MED ORDER — TRAMADOL HCL 50 MG PO TABS: 50.0000 mg | ORAL_TABLET | Freq: Two times a day (BID) | ORAL | 0 refills | Status: DC | PRN

## 2018-11-07 NOTE — Telephone Encounter (Signed)
Pt needing a refill on:  traMADol (ULTRAM) 50 MG tablet  Please fill at:  [id number] - [place], [place] - [address] [phone] (Phone) [phone] (Fax)    Please call pt back to let her know this was called in at [phone].  Thanks, [employer]

## 2018-11-07 NOTE — Telephone Encounter (Signed)
jenni's patient. Please review. Last OV was 06/23/2017. Last refill 07/10/2018

## 2019-01-11 ENCOUNTER — Encounter (INDEPENDENT_AMBULATORY_CARE_PROVIDER_SITE_OTHER): Payer: Self-pay

## 2019-01-11 ENCOUNTER — Ambulatory Visit (INDEPENDENT_AMBULATORY_CARE_PROVIDER_SITE_OTHER): Payer: Medicare Other | Admitting: Vascular Surgery

## 2019-01-11 ENCOUNTER — Telehealth (INDEPENDENT_AMBULATORY_CARE_PROVIDER_SITE_OTHER): Payer: Self-pay

## 2019-01-11 ENCOUNTER — Other Ambulatory Visit: Payer: Self-pay

## 2019-01-11 ENCOUNTER — Ambulatory Visit (INDEPENDENT_AMBULATORY_CARE_PROVIDER_SITE_OTHER): Payer: Medicare Other

## 2019-01-11 ENCOUNTER — Encounter (INDEPENDENT_AMBULATORY_CARE_PROVIDER_SITE_OTHER): Payer: Self-pay | Admitting: Vascular Surgery

## 2019-01-11 VITALS — BP 161/78 | HR 89 | Resp 16 | Wt 208.6 lb

## 2019-01-11 DIAGNOSIS — I75022 Atheroembolism of left lower extremity: Secondary | ICD-10-CM | POA: Diagnosis not present

## 2019-01-11 DIAGNOSIS — I739 Peripheral vascular disease, unspecified: Secondary | ICD-10-CM

## 2019-01-11 DIAGNOSIS — K219 Gastro-esophageal reflux disease without esophagitis: Secondary | ICD-10-CM

## 2019-01-11 DIAGNOSIS — E782 Mixed hyperlipidemia: Secondary | ICD-10-CM | POA: Diagnosis not present

## 2019-01-11 DIAGNOSIS — I6523 Occlusion and stenosis of bilateral carotid arteries: Secondary | ICD-10-CM | POA: Diagnosis not present

## 2019-01-11 DIAGNOSIS — I482 Chronic atrial fibrillation, unspecified: Secondary | ICD-10-CM

## 2019-01-11 DIAGNOSIS — I70222 Atherosclerosis of native arteries of extremities with rest pain, left leg: Secondary | ICD-10-CM | POA: Diagnosis not present

## 2019-01-11 MED ORDER — TRAMADOL HCL 50 MG PO TABS: 50.0000 mg | ORAL_TABLET | Freq: Four times a day (QID) | ORAL | 0 refills | Status: DC | PRN

## 2019-01-11 NOTE — Progress Notes (Signed)
MRN : [id number]  Kayla Maxwell is a 74 y.o. (10-21-1945) female who presents with chief complaint of follow up of PAD.  History of Present Illness:   The patient returns to the office for follow up of her PAD and review of the noninvasive studies.   She is status post percutaneous transluminal angioplasty and stent placement bilateral common iliac arteries using the kissing balloon technique on 08/12/2015.  There has been a significant deterioration in the lower extremity symptoms.    She notes the abrupt onset of severe pain in her left great toe which includes the second toe as well.  This occurred just a few weeks ago.  Pain is constant.  He also notes that the toe is discolored.  The patient notes interval shortening of their claudication distance and development of mild rest pain symptoms. No new ulcers or wounds have occurred since the last visit.  There have been no significant changes to the patient's overall health care.  The patient denies amaurosis fugax or recent TIA symptoms. There are no recent neurological changes noted. The patient denies history of DVT, PE or superficial thrombophlebitis. The patient denies recent episodes of angina or shortness of breath.  ABI Rt=1.01 and Lt=0.87 (previous ABI Rt=1.00 and Lt=1.03) Duplex ultrasound of the aorta iliac system shows common iliac stents appear patent.  No findings consistent with a hemodynamically significant stenosis within the iliacs.  Previous duplex ultrasound of the carotid arteries demonstrates less than 40% stenosis of the right internal carotid artery and occlusion of the left internal carotid artery.  No outpatient medications have been marked as taking for the 01/11/19 encounter (Appointment) with Delana Meyer, Dolores Lory, MD.    Past Medical History:  Diagnosis Date  . Acid reflux    takes Nexium daily  . Allergy    takes Singulair daily as needed  . Anemia   . Atrial fibrillation ([place])   . Cancer ([employer]) AGE 65    CERVICAL cancer with removal  . Chronic back pain    stenosis  . Complication of anesthesia   . Constipation    takes Colace daily as needed  . Coronary artery disease   . Difficult intubation   . Dysrhythmia    atrial fibrillation dx 04/2014  . Headache    sinus   . History of blood transfusion   . History of shingles   . Hyperlipidemia    takes Pravastatin daily-started taking this 6 months ago  . Hypertension   . Lumbar surgical wound fluid collection   . Sleep apnea    CPAP  . Stroke ([place])    TIA'S X 2 --- one last yr and one this yr...lost her memory over 4-6 hrs.  . Stroke ([place]) 06-04-15   left with right side weakness  . TIA (transient ischemic attack)    showed up on an MRI but she never knew it  . Urinary frequency     Past Surgical History:  Procedure Laterality Date  . ABDOMINAL HYSTERECTOMY    . APPENDECTOMY  1963  . BACK SURGERY  08/19/2014   Dr. Hal Neer ([place] [facility])  . BREAST SURGERY  1984   augementation  . [place]  . CHOLECYSTECTOMY  2014   Dr Jamal Collin  . COLONOSCOPY    . ESOPHAGOGASTRODUODENOSCOPY    . HAMMER TOE SURGERY Bilateral   . LUMBAR LAMINECTOMY WITH COFLEX 1 LEVEL Bilateral 07/04/2014   Procedure: Laminectomy and Foraminotomy - bilateral - Lumbar Four-Five with coflex ;  Surgeon: Karie Chimera, MD;  Location: [facility] NEURO ORS;  Service: Neurosurgery;  Laterality: Bilateral;  . LUMBAR WOUND DEBRIDEMENT N/A 08/19/2014   Procedure: Exploration of Lumbar wound;  Surgeon: Karie Chimera, MD;  Location: [facility] NEURO ORS;  Service: Neurosurgery;  Laterality: N/A;  . PERIPHERAL VASCULAR CATHETERIZATION Left 08/12/2015   Procedure: Lower Extremity Angiography;  Surgeon: Katha Cabal, MD;  Location: [facility] CV LAB;  Service: Cardiovascular;  Laterality: Left;  . TONSILLECTOMY    . VENTRAL HERNIA REPAIR N/A 05/26/2015   Procedure: HERNIA REPAIR VENTRAL ADULT;  Surgeon: Christene Lye, MD;  Location: [facility] ORS;  Service: General;   Laterality: N/A;    Social History Social History   Tobacco Use  . Smoking status: Former Smoker    Packs/day: 1.00    Years: 47.00    Pack years: 47.00    Quit date: 08/19/2014    Years since quitting: 4.4  . Smokeless tobacco: Former Systems developer    Quit date: 08/19/2014  Substance Use Topics  . Alcohol use: No  . Drug use: No    Family History Family History  Problem Relation Age of Onset  . Alzheimer's disease Mother   . Heart attack Father   . Pancreatic cancer Sister   . Healthy Brother     Allergies  Allergen Reactions  . Codeine Other (See Comments)    Makes her feel "crazy"  . Penicillins Swelling    Has patient had a PCN reaction causing immediate rash, facial/tongue/throat swelling, SOB or lightheadedness with hypotension: unknown Has patient had a PCN reaction causing severe rash involving mucus membranes or skin necrosis: unknown Has patient had a PCN reaction that required hospitalization unknown Has patient had a PCN reaction occurring within the last 10 years: no If all of the above answers are "NO", then may proceed with Cephalosporin use.  . Adhesive [Tape] Hives  . Sulfa Antibiotics Nausea Only and Rash     REVIEW OF SYSTEMS (Negative unless checked)  Constitutional: [] Weight loss  [] Fever  [] Chills Cardiac: [] Chest pain   [] Chest pressure   [] Palpitations   [] Shortness of breath when laying flat   [] Shortness of breath with exertion. Vascular:  [x] Pain in legs with walking   [x] Pain in legs at rest  [] History of DVT   [] Phlebitis   [] Swelling in legs   [] Varicose veins   [] Non-healing ulcers Pulmonary:   [] Uses home oxygen   [] Productive cough   [] Hemoptysis   [] Wheeze  [] COPD   [] Asthma Neurologic:  [] Dizziness   [] Seizures   [] History of stroke   [] History of TIA  [] Aphasia   [] Vissual changes   [] Weakness or numbness in arm   [] Weakness or numbness in leg Musculoskeletal:   [] Joint swelling   [x] Joint pain   [] Low back pain Hematologic:  [] Easy  bruising  [] Easy bleeding   [] Hypercoagulable state   [] Anemic Gastrointestinal:  [] Diarrhea   [] Vomiting  [x] Gastroesophageal reflux/heartburn   [] Difficulty swallowing. Genitourinary:  [] Chronic kidney disease   [] Difficult urination  [] Frequent urination   [] Blood in urine Skin:  [] Rashes   [] Ulcers  Psychological:  [] History of anxiety   []  History of major depression.  Physical Examination  There were no vitals filed for this visit. There is no height or weight on file to calculate BMI. Gen: WD/WN, NAD Head: [place]/AT, No temporalis wasting.  Ear/Nose/Throat: Hearing grossly intact, nares w/o erythema or drainage Eyes: PER, EOMI, sclera nonicteric.  Neck: Supple, no large masses.   Pulmonary:  Good air  movement, no audible wheezing bilaterally, no use of accessory muscles.  Cardiac: RRR, no JVD Vascular: Discoloration with a livedo pattern left great and second toe Vessel Right Left  Radial Palpable Palpable  PT Not Palpable Not Palpable  DP Not Palpable Not Palpable  Gastrointestinal: Non-distended. No guarding/no peritoneal signs.  Musculoskeletal: M/S 5/5 throughout.  No deformity or atrophy.  Neurologic: CN 2-12 intact. Symmetrical.  Speech is fluent. Motor exam as listed above. Psychiatric: Judgment intact, Mood & affect appropriate for pt's clinical situation. Dermatologic: No rashes or ulcers noted.  No changes consistent with cellulitis. Lymph : No lichenification or skin changes of chronic lymphedema.  CBC Lab Results  Component Value Date   WBC 10.0 04/04/2017   HGB 12.0 04/04/2017   HCT 39.2 04/04/2017   MCV 72 (L) 04/04/2017   PLT 354 04/04/2017    BMET    Component Value Date/Time   NA 141 04/04/2017 0000   NA 141 11/10/2013 0431   K 5.6 (H) 04/04/2017 0000   K 4.2 11/10/2013 0431   CL 98 04/04/2017 0000   CL 112 (H) 11/10/2013 0431   CO2 23 04/04/2017 0000   CO2 24 11/10/2013 0431   GLUCOSE 118 (H) 04/04/2017 0000   GLUCOSE 95 08/17/2016 0525    GLUCOSE 146 (H) 11/10/2013 0431   BUN 31 (H) 04/04/2017 0000   BUN 14 11/10/2013 0431   CREATININE 1.00 06/29/2017 1001   CREATININE 0.73 11/10/2013 0431   CALCIUM 9.6 04/04/2017 0000   CALCIUM 7.9 (L) 11/10/2013 0431   GFRNONAA 45 (L) 04/04/2017 0000   GFRNONAA >60 11/10/2013 0431   GFRNONAA >60 04/27/2012 1832   GFRAA 52 (L) 04/04/2017 0000   GFRAA >60 11/10/2013 0431   GFRAA >60 04/27/2012 1832   CrCl cannot be calculated (Patient's most recent lab result is older than the maximum 21 days allowed.).  COAG Lab Results  Component Value Date   INR 1.57 08/16/2016   INR 1.50 06/06/2015   INR 0.98 07/04/2014    Radiology No results found.   Assessment/Plan 1. Atherosclerosis of native artery of left lower extremity with rest pain (HCC) Recommend:  The patient has evidence of severe atherosclerotic changes of both lower extremities with rest pain that is associated with preulcerative changes and impending tissue loss of the left foot.  This represents a limb threatening ischemia and places the patient at the risk for left limb loss.  Patient should undergo angiography of the left lower extremity with the hope for intervention for limb salvage.  The risks and benefits as well as the alternative therapies was discussed in detail with the patient.  All questions were answered.  Patient agrees to proceed with left leg angiography.  The patient will follow up with me in the office after the procedure.     2. Atheroembolism of foot, left ([place]) Recommend:  The patient has evidence of severe atherosclerotic changes of both lower extremities with rest pain that is associated with preulcerative changes and impending tissue loss of the left foot.  This represents a limb threatening ischemia and places the patient at the risk for left limb loss.  Patient should undergo angiography of the left lower extremity with the hope for intervention for limb salvage.  The risks and benefits as well  as the alternative therapies was discussed in detail with the patient.  All questions were answered.  Patient agrees to proceed with left leg angiography.  The patient will follow up with me in the office  after the procedure.   3. Bilateral carotid artery stenosis Recommend:  Given the patient's asymptomatic subcritical stenosis no further invasive testing or surgery at this time.  Continue antiplatelet therapy as prescribed Continue management of CAD, HTN and Hyperlipidemia Healthy heart diet,  encouraged exercise at least 4 times per week Follow up in 6 months with duplex ultrasound and physical exam   4. Chronic atrial fibrillation (HCC) Continue antiarrhythmia medications as already ordered, these medications have been reviewed and there are no changes at this time.  Continue anticoagulation as ordered by Cardiology Service   5. Gastro-esophageal reflux disease without esophagitis Continue PPI as already ordered, this medication has been reviewed and there are no changes at this time.  Avoidence of caffeine and alcohol  Moderate elevation of the head of the bed   6. Mixed hyperlipidemia Continue statin as ordered and reviewed, no changes at this time     Hortencia Pilar, MD  01/11/2019 10:00 AM

## 2019-01-11 NOTE — Telephone Encounter (Signed)
I attempted to contact the patient and a message was left for a return call. Patient is scheduled with Dr. Delana Meyer for a left leg angio on 01/17/19 with a 11:30 am arrival time to the [facility]. Patient will do covid testing on 01/15/19 between 12:30-2:30 pm at the [facility]. Pre-procedure instructions will be mailed to the patient.

## 2019-01-12 ENCOUNTER — Encounter (INDEPENDENT_AMBULATORY_CARE_PROVIDER_SITE_OTHER): Payer: Self-pay

## 2019-01-12 NOTE — Telephone Encounter (Signed)
Patient has been rescheduled from 01/17/19 to 01/23/19 with Dr. Delana Meyer with a 6:45 am arrival time to the MM. Patient will do covid testing on 01/19/19 between 12:30-2:30 pm at the [facility]. Pre-procedure instructions will be mailed to the patient. The patient has been rescheduled per the patient.

## 2019-01-15 ENCOUNTER — Other Ambulatory Visit: Admission: RE | Admit: 2019-01-15 | Payer: Medicare Other | Source: Ambulatory Visit

## 2019-01-16 ENCOUNTER — Other Ambulatory Visit (INDEPENDENT_AMBULATORY_CARE_PROVIDER_SITE_OTHER): Payer: Self-pay | Admitting: Nurse Practitioner

## 2019-01-19 ENCOUNTER — Other Ambulatory Visit
Admission: RE | Admit: 2019-01-19 | Discharge: 2019-01-19 | Disposition: A | Discharge status: Home / Self Care | Payer: Medicare Other | Source: Ambulatory Visit | Attending: Vascular Surgery | Admitting: Vascular Surgery

## 2019-01-19 ENCOUNTER — Other Ambulatory Visit: Payer: Self-pay

## 2019-01-19 DIAGNOSIS — Z20822 Contact with and (suspected) exposure to covid-19: Secondary | ICD-10-CM | POA: Diagnosis not present

## 2019-01-19 DIAGNOSIS — Z01812 Encounter for preprocedural laboratory examination: Secondary | ICD-10-CM | POA: Diagnosis not present

## 2019-01-20 LAB — SARS CORONAVIRUS 2 (TAT 6-24 HRS): SARS Coronavirus 2: NEGATIVE

## 2019-01-22 MED ORDER — CLINDAMYCIN PHOSPHATE 300 MG/50ML IV SOLN: 300.0000 mg | Freq: Once | INTRAVENOUS | Status: DC

## 2019-01-23 ENCOUNTER — Other Ambulatory Visit: Payer: Self-pay

## 2019-01-23 ENCOUNTER — Ambulatory Visit
Admission: RE | Admit: 2019-01-23 | Discharge: 2019-01-23 | Disposition: A | Discharge status: Home / Self Care | Payer: Medicare Other | Attending: Vascular Surgery | Admitting: Vascular Surgery

## 2019-01-23 ENCOUNTER — Encounter: Payer: Self-pay | Admitting: Vascular Surgery

## 2019-01-23 ENCOUNTER — Encounter
Admission: RE | Disposition: A | Discharge status: Home / Self Care | Payer: Self-pay | Source: Home / Self Care | Attending: Vascular Surgery

## 2019-01-23 DIAGNOSIS — I6523 Occlusion and stenosis of bilateral carotid arteries: Secondary | ICD-10-CM | POA: Insufficient documentation

## 2019-01-23 DIAGNOSIS — E782 Mixed hyperlipidemia: Secondary | ICD-10-CM | POA: Insufficient documentation

## 2019-01-23 DIAGNOSIS — I70229 Atherosclerosis of native arteries of extremities with rest pain, unspecified extremity: Secondary | ICD-10-CM

## 2019-01-23 DIAGNOSIS — K219 Gastro-esophageal reflux disease without esophagitis: Secondary | ICD-10-CM | POA: Diagnosis not present

## 2019-01-23 DIAGNOSIS — Z885 Allergy status to narcotic agent status: Secondary | ICD-10-CM | POA: Insufficient documentation

## 2019-01-23 DIAGNOSIS — G4730 Sleep apnea, unspecified: Secondary | ICD-10-CM | POA: Diagnosis not present

## 2019-01-23 DIAGNOSIS — Z87891 Personal history of nicotine dependence: Secondary | ICD-10-CM | POA: Insufficient documentation

## 2019-01-23 DIAGNOSIS — Z8249 Family history of ischemic heart disease and other diseases of the circulatory system: Secondary | ICD-10-CM | POA: Insufficient documentation

## 2019-01-23 DIAGNOSIS — I75022 Atheroembolism of left lower extremity: Secondary | ICD-10-CM | POA: Insufficient documentation

## 2019-01-23 DIAGNOSIS — I2510 Atherosclerotic heart disease of native coronary artery without angina pectoris: Secondary | ICD-10-CM | POA: Diagnosis not present

## 2019-01-23 DIAGNOSIS — Z880 Allergy status to penicillin: Secondary | ICD-10-CM | POA: Insufficient documentation

## 2019-01-23 DIAGNOSIS — I482 Chronic atrial fibrillation, unspecified: Secondary | ICD-10-CM | POA: Insufficient documentation

## 2019-01-23 DIAGNOSIS — Z882 Allergy status to sulfonamides status: Secondary | ICD-10-CM | POA: Diagnosis not present

## 2019-01-23 DIAGNOSIS — Z8673 Personal history of transient ischemic attack (TIA), and cerebral infarction without residual deficits: Secondary | ICD-10-CM | POA: Insufficient documentation

## 2019-01-23 DIAGNOSIS — I70223 Atherosclerosis of native arteries of extremities with rest pain, bilateral legs: Secondary | ICD-10-CM | POA: Insufficient documentation

## 2019-01-23 DIAGNOSIS — I10 Essential (primary) hypertension: Secondary | ICD-10-CM | POA: Diagnosis not present

## 2019-01-23 HISTORY — PX: LOWER EXTREMITY ANGIOGRAPHY: CATH118251

## 2019-01-23 LAB — CREATININE, SERUM
Creatinine, Ser: 1 mg/dL (ref 0.44–1.00)
GFR calc Af Amer: >60 mL/min (ref >60)
GFR calc non Af Amer: 56 mL/min — ABNORMAL LOW (ref >60)

## 2019-01-23 LAB — BUN: BUN: 16 mg/dL (ref 8–23)

## 2019-01-23 SURGERY — LOWER EXTREMITY ANGIOGRAPHY
Anesthesia: Moderate Sedation | Site: Leg Lower | Laterality: Left

## 2019-01-23 MED ORDER — NALOXONE HCL 2 MG/2ML IJ SOSY
PREFILLED_SYRINGE | INTRAMUSCULAR | Status: AC
  Filled 2019-01-23: qty 2

## 2019-01-23 MED ORDER — METOCLOPRAMIDE HCL 5 MG/ML IJ SOLN
10.0000 mg | Freq: Once | INTRAMUSCULAR | Status: DC
  Filled 2019-01-23: qty 2

## 2019-01-23 MED ORDER — CLINDAMYCIN PHOSPHATE 300 MG/50ML IV SOLN
INTRAVENOUS | Status: AC
  Filled 2019-01-23: qty 50

## 2019-01-23 MED ORDER — MORPHINE SULFATE (PF) 4 MG/ML IV SOLN: 2.0000 mg | INTRAVENOUS | Status: DC | PRN

## 2019-01-23 MED ORDER — HYDRALAZINE HCL 20 MG/ML IJ SOLN: 5.0000 mg | INTRAMUSCULAR | Status: DC | PRN

## 2019-01-23 MED ORDER — ONDANSETRON 4 MG PO TBDP
ORAL_TABLET | ORAL | Status: AC
  Filled 2019-01-23: qty 1

## 2019-01-23 MED ORDER — METHYLPREDNISOLONE SODIUM SUCC 125 MG IJ SOLR: 125.0000 mg | Freq: Once | INTRAMUSCULAR | Status: DC | PRN

## 2019-01-23 MED ORDER — DIPHENHYDRAMINE HCL 50 MG/ML IJ SOLN: 50.0000 mg | Freq: Once | INTRAMUSCULAR | Status: DC | PRN

## 2019-01-23 MED ORDER — SODIUM CHLORIDE 0.9% FLUSH: 3.0000 mL | Freq: Two times a day (BID) | INTRAVENOUS | Status: DC

## 2019-01-23 MED ORDER — HYDROMORPHONE HCL 1 MG/ML IJ SOLN: 1.0000 mg | Freq: Once | INTRAMUSCULAR | Status: DC | PRN

## 2019-01-23 MED ORDER — ONDANSETRON HCL 4 MG PO TABS
8.0000 mg | ORAL_TABLET | Freq: Three times a day (TID) | ORAL | Status: DC | PRN
  Filled 2019-01-23: qty 2

## 2019-01-23 MED ORDER — SODIUM CHLORIDE FLUSH 0.9 % IV SOLN
INTRAVENOUS | Status: AC
  Filled 2019-01-23: qty 10

## 2019-01-23 MED ORDER — ACETAMINOPHEN 325 MG PO TABS
650.0000 mg | ORAL_TABLET | ORAL | Status: DC | PRN
  Administered 2019-01-23: 12:00:00 650 mg via ORAL

## 2019-01-23 MED ORDER — METOCLOPRAMIDE HCL 10 MG PO TABS
ORAL_TABLET | ORAL | Status: AC
  Administered 2019-01-23: 11:00:00 10 mg via ORAL
  Filled 2019-01-23: qty 1

## 2019-01-23 MED ORDER — SODIUM CHLORIDE 0.9 % IV SOLN: INTRAVENOUS | Status: DC

## 2019-01-23 MED ORDER — DIPHENHYDRAMINE HCL 50 MG/ML IJ SOLN
INTRAMUSCULAR | Status: AC
  Filled 2019-01-23: qty 1

## 2019-01-23 MED ORDER — ONDANSETRON HCL 4 MG/2ML IJ SOLN: 4.0000 mg | Freq: Four times a day (QID) | INTRAMUSCULAR | Status: DC | PRN

## 2019-01-23 MED ORDER — FENTANYL CITRATE (PF) 100 MCG/2ML IJ SOLN
INTRAMUSCULAR | Status: AC
  Filled 2019-01-23: qty 2

## 2019-01-23 MED ORDER — SODIUM CHLORIDE 0.9 % IV SOLN: 250.0000 mL | INTRAVENOUS | Status: DC | PRN

## 2019-01-23 MED ORDER — ONDANSETRON 4 MG PO TBDP: 4.0000 mg | ORAL_TABLET | Freq: Once | ORAL | Status: DC

## 2019-01-23 MED ORDER — FAMOTIDINE 20 MG PO TABS: 40.0000 mg | ORAL_TABLET | Freq: Once | ORAL | Status: DC | PRN

## 2019-01-23 MED ORDER — OXYCODONE HCL 5 MG PO TABS: 5.0000 mg | ORAL_TABLET | ORAL | Status: DC | PRN

## 2019-01-23 MED ORDER — LABETALOL HCL 5 MG/ML IV SOLN: 10.0000 mg | INTRAVENOUS | Status: DC | PRN

## 2019-01-23 MED ORDER — DIPHENHYDRAMINE HCL 50 MG/ML IJ SOLN
INTRAMUSCULAR | Status: DC | PRN
  Administered 2019-01-23: 25 mg via INTRAVENOUS

## 2019-01-23 MED ORDER — METOCLOPRAMIDE HCL 10 MG PO TABS: 10.0000 mg | ORAL_TABLET | Freq: Once | ORAL | Status: AC

## 2019-01-23 MED ORDER — FENTANYL CITRATE (PF) 100 MCG/2ML IJ SOLN
INTRAMUSCULAR | Status: DC | PRN
  Administered 2019-01-23 (×2): 50 ug via INTRAVENOUS

## 2019-01-23 MED ORDER — MIDAZOLAM HCL 5 MG/5ML IJ SOLN
INTRAMUSCULAR | Status: AC
  Filled 2019-01-23: qty 5

## 2019-01-23 MED ORDER — ONDANSETRON HCL 4 MG/2ML IJ SOLN
INTRAMUSCULAR | Status: AC
  Filled 2019-01-23: qty 2

## 2019-01-23 MED ORDER — IODIXANOL 320 MG/ML IV SOLN
INTRAVENOUS | Status: DC | PRN
  Administered 2019-01-23: 45 mL

## 2019-01-23 MED ORDER — MIDAZOLAM HCL 2 MG/2ML IJ SOLN
INTRAMUSCULAR | Status: DC | PRN
  Administered 2019-01-23: 1 mg via INTRAVENOUS
  Administered 2019-01-23: 2 mg via INTRAVENOUS

## 2019-01-23 MED ORDER — HEPARIN SODIUM (PORCINE) 1000 UNIT/ML IJ SOLN
INTRAMUSCULAR | Status: AC
  Filled 2019-01-23: qty 1

## 2019-01-23 MED ORDER — SODIUM CHLORIDE 0.9% FLUSH: 3.0000 mL | INTRAVENOUS | Status: DC | PRN

## 2019-01-23 MED ORDER — MIDAZOLAM HCL 2 MG/ML PO SYRP: 8.0000 mg | ORAL_SOLUTION | Freq: Once | ORAL | Status: DC | PRN

## 2019-01-23 MED ORDER — ACETAMINOPHEN 325 MG PO TABS
ORAL_TABLET | ORAL | Status: AC
  Filled 2019-01-23: qty 2

## 2019-01-23 SURGICAL SUPPLY — 13 items
CATH BEACON 5 .035 65 RIM TIP (CATHETERS) ×1 IMPLANT
CATH PIG 70CM (CATHETERS) ×1 IMPLANT
DEVICE SAFEGUARD 24CM (GAUZE/BANDAGES/DRESSINGS) ×1 IMPLANT
DEVICE STARCLOSE SE CLOSURE (Vascular Products) ×1 IMPLANT
GLIDEWIRE ADV .035X260CM (WIRE) ×1 IMPLANT
NDL ENTRY 21GA 7CM ECHOTIP (NEEDLE) IMPLANT
NEEDLE ENTRY 21GA 7CM ECHOTIP (NEEDLE) ×2 IMPLANT
PACK ANGIOGRAPHY (CUSTOM PROCEDURE TRAY) ×2 IMPLANT
SET INTRO CAPELLA COAXIAL (SET/KITS/TRAYS/PACK) ×1 IMPLANT
SHEATH BRITE TIP 5FRX11 (SHEATH) ×1 IMPLANT
SYR MEDRAD MARK 7 150ML (SYRINGE) ×1 IMPLANT
TUBING CONTRAST HIGH PRESS 72 (TUBING) ×1 IMPLANT
WIRE J 3MM .035X145CM (WIRE) ×1 IMPLANT

## 2019-01-23 NOTE — Op Note (Signed)
[facility]  Percutaneous Study/Intervention Procedural Note   Date of Surgery: 01/23/2019,9:34 AM  Surgeon:Detra Bores, Dolores Lory   Pre-operative Diagnosis: Atherosclerotic occlusive disease bilateral lower extremities with rest pain; atheroembolization left foot  Post-operative diagnosis:  Same  Procedure(s) Performed:  1.  Abdominal aortogram  2.  RAO projection of the pelvis  3.  Imaging of the common femoral profunda femoris and proximal SFA second-order catheter placement  4.  Star close right common femoral   Anesthesia: Conscious sedation was administered by the interventional radiology RN under my direct supervision. IV Versed plus fentanyl were utilized. Continuous ECG, pulse oximetry and blood pressure was monitored throughout the entire procedure.  Conscious sedation was administered for a total of 40 minutes.  Sheath: 5 French 11 cm Pinnacle  Contrast: 45 cc   Fluoroscopy Time: 2.9 minutes  Indications:  The patient [facility] with gangrenous changes to the left foot.  Pedal pulses are nonpalpable bilaterally suggesting atherosclerotic occlusive disease.  The risks and benefits as well as alternative therapies for lower extremity revascularization are reviewed with the patient all questions are answered the patient agrees to proceed.  The patient is therefore undergoing angiography with the hope for intervention for limb salvage.   Procedure:  ALMETIA NOP a 74 y.o. female who was identified and appropriate procedural time out was performed.  The patient was then placed supine on the table and prepped and draped in the usual sterile fashion.  Ultrasound was used to evaluate the right common femoral artery.  It was echolucent and pulsatile indicating it is patent .  An ultrasound image was acquired for the permanent record.  A micropuncture needle was used to access the right common femoral artery under direct ultrasound guidance.   The microwire was then advanced under fluoroscopic guidance without difficulty followed by the micro-sheath.  A 0.035 J wire was advanced without resistance and a 5Fr sheath was placed.    Pigtail catheter was then advanced to the level of T12 and AP projection of the aorta was obtained. Pigtail catheter was then repositioned to above the bifurcation and RAO view of the pelvis was obtained. Stiff angled Glidewire and rim catheter was then used across the bifurcation and the catheter was positioned in the distal external iliac artery.  LAO of the left groin was then obtained.  At this point the patient was combative and we elected to terminate the procedure for safety reasons.  StarClose device was deployed without difficulty.   Findings:   Aortogram: The abdominal aorta is opacified with a bolus injection of contrast.  There is rapid filling of the visceral vessels.  Bilateral renal arteries are noted no evidence of hemodynamically significant renal artery stenosis.  In the infrarenal aorta there is a bulky shelflike plaque which has an ulcerative component.  The previous placed common iliac artery stents are both patent however on the left distal to the stent there appears to be an ulceration of the medial distal left common iliac the external iliac arteries are widely patent bilaterally.  Left Lower Extremity: The common femoral and visualized portions of the profunda femoris and SFA are widely patent there is minimal if any atherosclerotic changes noted.  No hemodynamically significant lesions or strictures are identified.  Disposition: Patient was taken to the recovery room in stable condition having tolerated the procedure well.  Belenda Cruise Chrishana Spargur 01/23/2019,9:34 AM

## 2019-01-23 NOTE — Progress Notes (Signed)
RT called to vascular lab to readjust CPAP mask due to patient pulling at mask stating she could not breath. Mask replaced and patient seemed to tolerate for a few minutes before complaining that she could not breath again. Dr. Waldron [facility] asked that patient be taken off of CPAP and placed back on NRM. Sats 100% at this time.

## 2019-01-23 NOTE — H&P (Signed)
[facility] History & Physical Update  The patient was interviewed and re-examined.  The patient's previous History and Physical has been reviewed and is unchanged.  There is no change in the plan of care. We plan to proceed with the scheduled procedure.  Hortencia Pilar, MD  01/23/2019, 8:04 AM

## 2019-01-23 NOTE — OR Nursing (Signed)
zofran and reglan given for nausea (iv was out when attempted to pus) so [employer] notified and orders changed to po

## 2019-01-23 NOTE — OR Nursing (Signed)
Pt reports nausea improving , attempting to eat a bit of food. Alert and oriented but [place] fidgiting

## 2019-01-23 NOTE — Progress Notes (Signed)
Pt. With snorous respirations: respiratory called back into room: manually bagged. Procedure aborted. Starclosed right groin by MD. Narcan 0.4 mg IVP given. Pt. Aggitated, fighting respiratory.

## 2019-01-23 NOTE — Progress Notes (Signed)
Kayla Maxwell given discharge instructions at curb side of medical mall due to Covid restrictions

## 2019-01-23 NOTE — Discharge Instructions (Signed)
Restart your Eliquis today   Angiogram, Care After This sheet gives you information about how to care for yourself after your procedure. Your health care provider may also give you more specific instructions. If you have problems or questions, contact your health care provider. What can I expect after the procedure? After the procedure, it is common to have bruising and tenderness at the catheter insertion area. Follow these instructions at home: Insertion site care  Follow instructions from your health care provider about how to take care of your insertion site. Make sure you: ? Wash your hands with soap and water before you change your bandage (dressing). If soap and water are not available, use hand sanitizer. ? Change your dressing as told by your health care provider. ? Leave stitches (sutures), skin glue, or adhesive strips in place. These skin closures may need to stay in place for 2 weeks or longer. If adhesive strip edges start to loosen and curl up, you may trim the loose edges. Do not remove adhesive strips completely unless your health care provider tells you to do that.  Do not take baths, swim, or use a hot tub until your health care provider approves.  You may shower 24-48 hours after the procedure or as told by your health care provider. ? Gently wash the site with plain soap and water. ? Pat the area dry with a clean towel. ? Do not rub the site. This may cause bleeding.  Do not apply powder or lotion to the site. Keep the site clean and dry.  Check your insertion site every day for signs of infection. Check for: ? Redness, swelling, or pain. ? Fluid or blood. ? Warmth. ? Pus or a bad smell. Activity  Rest as told by your health care provider, usually for 1-2 days.  Do not lift anything that is heavier than 10 lbs. (4.5 kg) or as told by your health care provider.  Do not drive for 24 hours if you were given a medicine to help you relax (sedative).  Do not drive  or use heavy machinery while taking prescription pain medicine. General instructions   Return to your normal activities as told by your health care provider, usually in about a week. Ask your health care provider what activities are safe for you.  If the catheter site starts bleeding, lie flat and put pressure on the site. If the bleeding does not stop, get help right away. This is a medical emergency.  Drink enough fluid to keep your urine clear or pale yellow. This helps flush the contrast dye from your body.  Take over-the-counter and prescription medicines only as told by your health care provider.  Keep all follow-up visits as told by your health care provider. This is important. Contact a health care provider if:  You have a fever or chills.  You have redness, swelling, or pain around your insertion site.  You have fluid or blood coming from your insertion site.  The insertion site feels warm to the touch.  You have pus or a bad smell coming from your insertion site.  You have bruising around the insertion site.  You notice blood collecting in the tissue around the catheter site (hematoma). The hematoma may be painful to the touch. Get help right away if:  You have severe pain at the catheter insertion area.  The catheter insertion area swells very fast.  The catheter insertion area is bleeding, and the bleeding does not stop when you  hold steady pressure on the area.  The area near or just beyond the catheter insertion site becomes pale, cool, tingly, or numb. These symptoms may represent a serious problem that is an emergency. Do not wait to see if the symptoms will go away. Get medical help right away. Call your local emergency services (911 in the [place] Do not drive yourself to the hospital. Summary  After the procedure, it is common to have bruising and tenderness at the catheter insertion area.  After the procedure, it is important to rest and drink plenty of  fluids.  Do not take baths, swim, or use a hot tub until your health care provider says it is okay to do so. You may shower 24-48 hours after the procedure or as told by your health care provider.  If the catheter site starts bleeding, lie flat and put pressure on the site. If the bleeding does not stop, get help right away. This is a medical emergency. This information is not intended to replace advice given to you by your health care provider. Make sure you discuss any questions you have with your health care provider. Document Revised: 12/03/2016 Document Reviewed: 11/26/2015 Elsevier Patient Education  2020 [employer].

## 2019-01-24 ENCOUNTER — Encounter: Payer: Self-pay | Admitting: Cardiology

## 2019-01-29 ENCOUNTER — Telehealth (INDEPENDENT_AMBULATORY_CARE_PROVIDER_SITE_OTHER): Payer: Self-pay | Admitting: Vascular Surgery

## 2019-01-29 NOTE — Telephone Encounter (Signed)
The patient stated that the procedure had to be stop due to her reaction and would like to know any further information. I spoke with Dr Delana Meyer and he advise that tiredness is usually not related to the angio procedure.The patient stated that the oozing had stop last night and I made her aware that she can take the bandage off the site. When the patient get schedule she will have a different anesthesia from the anesthesiologist. Patient has been made aware with medical advice verbalized understanding.

## 2019-02-08 ENCOUNTER — Other Ambulatory Visit: Payer: Self-pay

## 2019-02-08 ENCOUNTER — Ambulatory Visit (INDEPENDENT_AMBULATORY_CARE_PROVIDER_SITE_OTHER): Payer: Medicare Other | Admitting: Vascular Surgery

## 2019-02-08 ENCOUNTER — Encounter (INDEPENDENT_AMBULATORY_CARE_PROVIDER_SITE_OTHER): Payer: Self-pay | Admitting: Vascular Surgery

## 2019-02-08 VITALS — BP 119/62 | HR 114 | Resp 18 | Wt 204.0 lb

## 2019-02-08 DIAGNOSIS — I872 Venous insufficiency (chronic) (peripheral): Secondary | ICD-10-CM

## 2019-02-08 DIAGNOSIS — I70222 Atherosclerosis of native arteries of extremities with rest pain, left leg: Secondary | ICD-10-CM

## 2019-02-08 DIAGNOSIS — I6523 Occlusion and stenosis of bilateral carotid arteries: Secondary | ICD-10-CM

## 2019-02-08 DIAGNOSIS — E782 Mixed hyperlipidemia: Secondary | ICD-10-CM

## 2019-02-08 DIAGNOSIS — I482 Chronic atrial fibrillation, unspecified: Secondary | ICD-10-CM

## 2019-02-08 DIAGNOSIS — I10 Essential (primary) hypertension: Secondary | ICD-10-CM

## 2019-02-08 MED ORDER — HYDROCODONE-ACETAMINOPHEN 5-325 MG PO TABS: 1.0000 | ORAL_TABLET | Freq: Four times a day (QID) | ORAL | 0 refills | Status: DC | PRN

## 2019-02-08 NOTE — Progress Notes (Signed)
MRN : [id number]  Kayla Maxwell is a 74 y.o. (1945-12-20) female who presents with chief complaint of No chief complaint on file. Marland Kitchen  History of Present Illness:   The patient returns to the office for followup and review status post angiogram with intervention. The patient notes there has been no improvement in the lower extremity symptoms.She actually feels there has been an increase in her rest pain symptoms.  No new ulcers or wounds have occurred since the last visit.  There have been no significant changes to the patient's overall health care.  The patient denies amaurosis fugax or recent TIA symptoms. There are no recent neurological changes noted. The patient denies history of DVT, PE or superficial thrombophlebitis. The patient denies recent episodes of angina or shortness of breath.     No outpatient medications have been marked as taking for the 02/08/19 encounter (Appointment) with Delana Meyer, Dolores Lory, MD.    Past Medical History:  Diagnosis Date   Acid reflux    takes Nexium daily   Allergy    takes Singulair daily as needed   Anemia    Atrial fibrillation ([place])    Cancer ([place]) AGE 56   CERVICAL cancer with removal   Chronic back pain    stenosis   Complication of anesthesia    Constipation    takes Colace daily as needed   Coronary artery disease    Difficult intubation    Dysrhythmia    atrial fibrillation dx 04/2014   Headache    sinus    History of blood transfusion    History of shingles    Hyperlipidemia    takes Pravastatin daily-started taking this 6 months ago   Hypertension    Lumbar surgical wound fluid collection    Sleep apnea    CPAP   Stroke ([place])    TIA'S X 2 --- one last yr and one this yr...lost her memory over 4-6 hrs.   Stroke [facility]) 06-04-15   left with right side weakness   TIA (transient ischemic attack)    showed up on an MRI but she never knew it   Urinary frequency     Past Surgical History:    Procedure Laterality Date   [place]  08/19/2014   Dr. Hal Neer ([facility])   [place]  2014   Dr Jamal Collin   COLONOSCOPY     ESOPHAGOGASTRODUODENOSCOPY     HAMMER TOE SURGERY Bilateral    LOWER EXTREMITY ANGIOGRAPHY Left 01/23/2019   Procedure: LOWER EXTREMITY ANGIOGRAPHY;  Surgeon: Katha Cabal, MD;  Location: [place] [facility];  Service: Cardiovascular;  Laterality: Left;   LUMBAR LAMINECTOMY WITH COFLEX 1 LEVEL Bilateral 07/04/2014   Procedure: Laminectomy and Foraminotomy - bilateral - Lumbar Four-Five with coflex ;  Surgeon: Karie Chimera, MD;  Location: [place] NEURO ORS;  Service: Neurosurgery;  Laterality: Bilateral;   LUMBAR WOUND DEBRIDEMENT N/A 08/19/2014   Procedure: Exploration of Lumbar wound;  Surgeon: Karie Chimera, MD;  Location: [place] NEURO ORS;  Service: Neurosurgery;  Laterality: N/A;   PERIPHERAL VASCULAR CATHETERIZATION Left 08/12/2015   Procedure: Lower Extremity Angiography;  Surgeon: Katha Cabal, MD;  Location: [employer] [facility];  Service: Cardiovascular;  Laterality: Left;   TONSILLECTOMY     VENTRAL HERNIA REPAIR N/A 05/26/2015   Procedure: HERNIA REPAIR VENTRAL ADULT;  Surgeon: Christene Lye, MD;  Location: [facility] ORS;  Service: General;  Laterality: N/A;    Social History Social History   Tobacco Use   Smoking status: Former Smoker    Packs/day: 1.00    Years: 47.00    Pack years: 47.00    Quit date: 08/19/2014    Years since quitting: 4.4   Smokeless tobacco: Former Systems developer    Quit date: 08/19/2014  Substance Use Topics   Alcohol use: No   Drug use: No    Family History Family History  Problem Relation Age of Onset   Alzheimer's disease Mother    Heart attack Father    Pancreatic cancer Sister    Healthy Brother     Allergies  Allergen Reactions   Codeine Other (See Comments)     Makes her feel "crazy"   Penicillins Swelling    Did it involve swelling of the face/tongue/throat, SOB, or low BP? Unknown Did it involve sudden or severe rash/hives, skin peeling, or any reaction on the inside of your mouth or nose? No Did you need to seek medical attention at a hospital or doctor's office? Unknown When did it last happen?Childhood allergy If all above answers are NO, may proceed with cephalosporin use.    Adhesive [Tape] Hives   Sulfa Antibiotics Nausea Only and Rash     REVIEW OF SYSTEMS (Negative unless checked)  Constitutional: [] Weight loss  [] Fever  [] Chills Cardiac: [] Chest pain   [] Chest pressure   [] Palpitations   [] Shortness of breath when laying flat   [] Shortness of breath with exertion. Vascular:  [x] Pain in legs with walking   [x] Pain in legs at rest  [] History of DVT   [] Phlebitis   [] Swelling in legs   [] Varicose veins   [] Non-healing ulcers Pulmonary:   [] Uses home oxygen   [] Productive cough   [] Hemoptysis   [] Wheeze  [] COPD   [] Asthma Neurologic:  [] Dizziness   [] Seizures   [] History of stroke   [] History of TIA  [] Aphasia   [] Vissual changes   [] Weakness or numbness in arm   [] Weakness or numbness in leg Musculoskeletal:   [] Joint swelling   [] Joint pain   [] Low back pain Hematologic:  [] Easy bruising  [] Easy bleeding   [] Hypercoagulable state   [] Anemic Gastrointestinal:  [] Diarrhea   [] Vomiting  [] Gastroesophageal reflux/heartburn   [] Difficulty swallowing. Genitourinary:  [] Chronic kidney disease   [] Difficult urination  [] Frequent urination   [] Blood in urine Skin:  [] Rashes   [] Ulcers  Psychological:  [] History of anxiety   []  History of major depression.  Physical Examination  There were no vitals filed for this visit. There is no height or weight on file to calculate BMI. Gen: WD/WN, NAD Head: [place]/AT, No temporalis wasting.  Ear/Nose/Throat: Hearing grossly intact, nares w/o erythema or drainage Eyes: PER, EOMI, sclera  nonicteric.  Neck: Supple, no large masses.   Pulmonary:  Good air movement, no audible wheezing bilaterally, no use of accessory muscles.  Cardiac: RRR, no JVD Vascular:  Discoloration of left great toe and second toe Vessel Right Left  Radial Palpable Palpable  PT Not Palpable Not Palpable  DP Not Palpable Not Palpable  Gastrointestinal: Non-distended. No guarding/no peritoneal signs.  Musculoskeletal: M/S 5/5 throughout.  No deformity or atrophy.  Neurologic: CN 2-12 intact. Symmetrical.  Speech is fluent. Motor exam as listed above. Psychiatric: Judgment intact, Mood & affect appropriate for pt's clinical situation. Dermatologic: No rashes or ulcers noted.  No changes consistent with cellulitis. Lymph :  No lichenification or skin changes of chronic lymphedema.  CBC Lab Results  Component Value Date   WBC 10.0 04/04/2017   HGB 12.0 04/04/2017   HCT 39.2 04/04/2017   MCV 72 (L) 04/04/2017   PLT 354 04/04/2017    BMET    Component Value Date/Time   NA 141 04/04/2017 0000   NA 141 11/10/2013 0431   K 5.6 (H) 04/04/2017 0000   K 4.2 11/10/2013 0431   CL 98 04/04/2017 0000   CL 112 (H) 11/10/2013 0431   CO2 23 04/04/2017 0000   CO2 24 11/10/2013 0431   GLUCOSE 118 (H) 04/04/2017 0000   GLUCOSE 95 08/17/2016 0525   GLUCOSE 146 (H) 11/10/2013 0431   BUN 16 01/23/2019 0721   BUN 31 (H) 04/04/2017 0000   BUN 14 11/10/2013 0431   CREATININE 1.00 01/23/2019 0721   CREATININE 0.73 11/10/2013 0431   CALCIUM 9.6 04/04/2017 0000   CALCIUM 7.9 (L) 11/10/2013 0431   GFRNONAA 56 (L) 01/23/2019 0721   GFRNONAA >60 11/10/2013 0431   GFRNONAA >60 04/27/2012 1832   GFRAA >60 01/23/2019 0721   GFRAA >60 11/10/2013 0431   GFRAA >60 04/27/2012 1832   CrCl cannot be calculated (Unknown ideal weight.).  COAG Lab Results  Component Value Date   INR 1.57 08/16/2016   INR 1.50 06/06/2015   INR 0.98 07/04/2014    Radiology PERIPHERAL VASCULAR CATHETERIZATION  Result Date:  01/23/2019 See op note   VAS [place] ABI WITH/WO TBI  Result Date: 01/11/2019 LOWER EXTREMITY DOPPLER STUDY Indications: Peripheral artery disease.  Vascular Interventions: 08/12/2015: PTA of the Right Common Iliac Artery. PTA and                         Plasty/Stent placement of the Left Common Iliac Artery. Comparison Study: 07/10/2018 Performing Technologist: Almira Coaster RVS  Examination Guidelines: A complete evaluation includes at minimum, Doppler waveform signals and systolic blood pressure reading at the level of bilateral brachial, anterior tibial, and posterior tibial arteries, when vessel segments are accessible. Bilateral testing is considered an integral part of a complete examination. Photoelectric Plethysmograph (PPG) waveforms and toe systolic pressure readings are included as required and additional duplex testing as needed. Limited examinations for reoccurring indications may be performed as noted.  ABI Findings: +---------+------------------+-----+---------+--------+  Right     Rt Pressure (mmHg) Index Waveform  Comment   +---------+------------------+-----+---------+--------+  Brachial  215                                          +---------+------------------+-----+---------+--------+  ATA       215                1.00  triphasic           +---------+------------------+-----+---------+--------+  PTA       217                1.01  triphasic           +---------+------------------+-----+---------+--------+  Great Toe 167                0.78  Normal              +---------+------------------+-----+---------+--------+ +---------+------------------+-----+--------+-------+  Left      Lt Pressure (mmHg) Index Waveform Comment  +---------+------------------+-----+--------+-------+  Brachial  212                                        +---------+------------------+-----+--------+-------+  ATA       182                0.85  biphasic          +---------+------------------+-----+--------+-------+  PTA        186                0.87  biphasic          +---------+------------------+-----+--------+-------+  Great Toe 159                0.74  Normal            +---------+------------------+-----+--------+-------+ +-------+-----------+-----------+------------+------------+  ABI/TBI Today's ABI Today's TBI Previous ABI Previous TBI  +-------+-----------+-----------+------------+------------+  Right   1.01        .78         1.00         .85           +-------+-----------+-----------+------------+------------+  Left    .87         .74         1.03         .93           +-------+-----------+-----------+------------+------------+ Left ABIs appear decreased compared to prior study on 07/10/2018. Bilateral TBIs appear decreased compared to prior study on 07/10/2018.  Summary: Right: Resting right ankle-brachial index is within normal range. No evidence of significant right lower extremity arterial disease. The right toe-brachial index is normal. Left: Resting left ankle-brachial index indicates mild left lower extremity arterial disease. The left toe-brachial index is normal.  *See table(s) above for measurements and observations.  Electronically signed by Hortencia Pilar MD on 01/11/2019 at 5:21:34 PM.    Final    VAS US AORTA/IVC/ILIACS  Result Date: 01/11/2019 ABDOMINAL AORTA STUDY Vascular Interventions: 08/12/2015: PTA of the Right Common Iliac Artery. PTA and                         Plasty/Stent placement of the Left Common Iliac Artery.  Comparison Study: 02/16/2017 Performing Technologist: Almira Coaster RVS  Examination Guidelines: A complete evaluation includes B-mode imaging, spectral Doppler, color Doppler, and power Doppler as needed of all accessible portions of each vessel. Bilateral testing is considered an integral part of a complete examination. Limited examinations for reoccurring indications may be performed as noted.  Abdominal Aorta Findings:  +-------------+-------+----------+----------+----------+--------+--------+  Location      AP (cm) Trans (cm) PSV (cm/s) Waveform   Thrombus Comments  +-------------+-------+----------+----------+----------+--------+--------+  Proximal      2.12    2.16       91         monophasic                    +-------------+-------+----------+----------+----------+--------+--------+  Mid           1.91    2.20       81         monophasic                    +-------------+-------+----------+----------+----------+--------+--------+  Distal        2.31    2.69       43         monophasic                    +-------------+-------+----------+----------+----------+--------+--------+  RT CIA Prox   1.3     1.1  183        biphasic                      +-------------+-------+----------+----------+----------+--------+--------+  RT CIA Distal 1.2     1.2        177        biphasic                      +-------------+-------+----------+----------+----------+--------+--------+  RT EIA Prox   1.2     1.3        165        biphasic                      +-------------+-------+----------+----------+----------+--------+--------+  RT EIA Distal 0.8     0.8        128        biphasic                      +-------------+-------+----------+----------+----------+--------+--------+  LT CIA Prox   0.9     0.9        180        monophasic                    +-------------+-------+----------+----------+----------+--------+--------+  LT CIA Distal 1.0     1.0        172        monophasic                    +-------------+-------+----------+----------+----------+--------+--------+  LT EIA Prox   0.9     1.1        178        monophasic                    +-------------+-------+----------+----------+----------+--------+--------+  LT EIA Distal 1.0     1.1        90         biphasic                      +-------------+-------+----------+----------+----------+--------+--------+  Summary: Abdominal Aorta: There is evidence of abnormal dilatation of the  proximal, mid and distal Abdominal aorta. The largest aortic measurement is 2.7 cm. No measurements recorded on Previous Exam. Previous diameter measurement was obtained on 02/16/2017.  *See table(s) above for measurements and observations.  Electronically signed by Hortencia Pilar MD on 01/11/2019 at 5:21:38 PM.   Final      Assessment/Plan 1. Atherosclerosis of native artery of left lower extremity with rest pain (HCC) Recommend:  The patient has evidence of severe atherosclerotic changes of both lower extremities with left rest pain that is associated with preulcerative changes and impending tissue loss of the foot.  This represents a limb threatening ischemia and places the patient at the risk for left limb loss.  Patient should undergo angiography of the left lower extremity with the hope for intervention for limb salvage.  The risks and benefits as well as the alternative therapies was discussed in detail with the patient.  All questions were answered.  Patient agrees to proceed with left leg angiography.  The patient will follow up with me in the office after the procedure.    2. Bilateral carotid artery stenosis Recommend:  Given the patient's asymptomatic subcritical stenosis no further invasive testing or surgery at this time.  Continue antiplatelet therapy as prescribed Continue management of CAD, HTN and Hyperlipidemia  Healthy heart diet,  encouraged exercise at least 4 times per week Follow up in 6 months with duplex ultrasound and physical exam   3. Benign essential HTN Continue antihypertensive medications as already ordered, these medications have been reviewed and there are no changes at this time.   4. Chronic atrial fibrillation (HCC) Continue antiarrhythmia medications as already ordered, these medications have been reviewed and there are no changes at this time.  Continue anticoagulation as ordered by Cardiology Service   5. Venous insufficiency of both lower  extremities  No surgery or intervention at this point in time.    I have reviewed my previous discussion with the patient regarding swelling and why it  causes symptoms.  The patient is doing well with compression and will continue wearing graduated compression stockings class 1 (20-30 mmHg) on a daily basis a prescription was given. The patient will  continue wearing the stockings first thing in the morning and removing them in the evening. The patient is instructed specifically not to sleep in the stockings.    In addition, behavioral modification including elevation during the day and exercise will be continued.    6. Mixed hyperlipidemia Continue statin as ordered and reviewed, no changes at this time     Hortencia Pilar, MD  02/08/2019 11:31 AM

## 2019-02-11 ENCOUNTER — Encounter (INDEPENDENT_AMBULATORY_CARE_PROVIDER_SITE_OTHER): Payer: Self-pay | Admitting: Vascular Surgery

## 2019-02-12 ENCOUNTER — Telehealth (INDEPENDENT_AMBULATORY_CARE_PROVIDER_SITE_OTHER): Payer: Self-pay

## 2019-02-12 NOTE — Telephone Encounter (Signed)
Spoke with the patient and she is now scheduled with Dr. Delana Meyer for a left leg angio on 02/20/19 with a 11:00 am arrival time to the [facility]. Patient will do covid testing on 02/16/19 between 12:30-2:30 pm at the [facility]. Pre--procedure instructions were discussed and will be mailed to the patient.

## 2019-02-16 ENCOUNTER — Other Ambulatory Visit
Admission: RE | Admit: 2019-02-16 | Discharge: 2019-02-16 | Disposition: A | Discharge status: Home / Self Care | Payer: Medicare Other | Source: Ambulatory Visit | Attending: Vascular Surgery | Admitting: Vascular Surgery

## 2019-02-16 ENCOUNTER — Other Ambulatory Visit: Payer: Self-pay

## 2019-02-16 DIAGNOSIS — Z01812 Encounter for preprocedural laboratory examination: Secondary | ICD-10-CM | POA: Diagnosis not present

## 2019-02-16 DIAGNOSIS — Z20822 Contact with and (suspected) exposure to covid-19: Secondary | ICD-10-CM | POA: Insufficient documentation

## 2019-02-17 LAB — SARS CORONAVIRUS 2 (TAT 6-24 HRS): SARS Coronavirus 2: NEGATIVE

## 2019-02-19 ENCOUNTER — Other Ambulatory Visit (INDEPENDENT_AMBULATORY_CARE_PROVIDER_SITE_OTHER): Payer: Self-pay | Admitting: Nurse Practitioner

## 2019-02-19 MED ORDER — CLINDAMYCIN PHOSPHATE 300 MG/50ML IV SOLN
300.0000 mg | Freq: Once | INTRAVENOUS | Status: AC
  Administered 2019-02-20: 300 mg via INTRAVENOUS

## 2019-02-20 ENCOUNTER — Ambulatory Visit: Payer: Medicare Other | Admitting: Registered Nurse

## 2019-02-20 ENCOUNTER — Ambulatory Visit
Admission: RE | Admit: 2019-02-20 | Discharge: 2019-02-20 | Disposition: A | Discharge status: Home / Self Care | Payer: Medicare Other | Attending: Vascular Surgery | Admitting: Vascular Surgery

## 2019-02-20 ENCOUNTER — Other Ambulatory Visit: Payer: Self-pay

## 2019-02-20 ENCOUNTER — Encounter
Admission: RE | Disposition: A | Discharge status: Home / Self Care | Payer: Self-pay | Source: Home / Self Care | Attending: Vascular Surgery

## 2019-02-20 ENCOUNTER — Encounter: Payer: Self-pay | Admitting: Vascular Surgery

## 2019-02-20 DIAGNOSIS — Z885 Allergy status to narcotic agent status: Secondary | ICD-10-CM | POA: Diagnosis not present

## 2019-02-20 DIAGNOSIS — Z882 Allergy status to sulfonamides status: Secondary | ICD-10-CM | POA: Insufficient documentation

## 2019-02-20 DIAGNOSIS — I6523 Occlusion and stenosis of bilateral carotid arteries: Secondary | ICD-10-CM | POA: Insufficient documentation

## 2019-02-20 DIAGNOSIS — E782 Mixed hyperlipidemia: Secondary | ICD-10-CM | POA: Diagnosis not present

## 2019-02-20 DIAGNOSIS — I70223 Atherosclerosis of native arteries of extremities with rest pain, bilateral legs: Secondary | ICD-10-CM | POA: Insufficient documentation

## 2019-02-20 DIAGNOSIS — K219 Gastro-esophageal reflux disease without esophagitis: Secondary | ICD-10-CM | POA: Diagnosis not present

## 2019-02-20 DIAGNOSIS — I872 Venous insufficiency (chronic) (peripheral): Secondary | ICD-10-CM | POA: Diagnosis not present

## 2019-02-20 DIAGNOSIS — I70222 Atherosclerosis of native arteries of extremities with rest pain, left leg: Secondary | ICD-10-CM | POA: Diagnosis not present

## 2019-02-20 DIAGNOSIS — Z87891 Personal history of nicotine dependence: Secondary | ICD-10-CM | POA: Insufficient documentation

## 2019-02-20 DIAGNOSIS — G4733 Obstructive sleep apnea (adult) (pediatric): Secondary | ICD-10-CM | POA: Diagnosis not present

## 2019-02-20 DIAGNOSIS — Z8249 Family history of ischemic heart disease and other diseases of the circulatory system: Secondary | ICD-10-CM | POA: Insufficient documentation

## 2019-02-20 DIAGNOSIS — I70201 Unspecified atherosclerosis of native arteries of extremities, right leg: Secondary | ICD-10-CM | POA: Diagnosis not present

## 2019-02-20 DIAGNOSIS — E785 Hyperlipidemia, unspecified: Secondary | ICD-10-CM | POA: Diagnosis not present

## 2019-02-20 DIAGNOSIS — Z8673 Personal history of transient ischemic attack (TIA), and cerebral infarction without residual deficits: Secondary | ICD-10-CM | POA: Insufficient documentation

## 2019-02-20 DIAGNOSIS — D649 Anemia, unspecified: Secondary | ICD-10-CM | POA: Diagnosis not present

## 2019-02-20 DIAGNOSIS — G4730 Sleep apnea, unspecified: Secondary | ICD-10-CM | POA: Insufficient documentation

## 2019-02-20 DIAGNOSIS — I2510 Atherosclerotic heart disease of native coronary artery without angina pectoris: Secondary | ICD-10-CM | POA: Insufficient documentation

## 2019-02-20 DIAGNOSIS — I482 Chronic atrial fibrillation, unspecified: Secondary | ICD-10-CM | POA: Insufficient documentation

## 2019-02-20 DIAGNOSIS — I70229 Atherosclerosis of native arteries of extremities with rest pain, unspecified extremity: Secondary | ICD-10-CM

## 2019-02-20 DIAGNOSIS — Z880 Allergy status to penicillin: Secondary | ICD-10-CM | POA: Diagnosis not present

## 2019-02-20 DIAGNOSIS — I10 Essential (primary) hypertension: Secondary | ICD-10-CM | POA: Insufficient documentation

## 2019-02-20 HISTORY — PX: LOWER EXTREMITY ANGIOGRAPHY: CATH118251

## 2019-02-20 LAB — BASIC METABOLIC PANEL
Anion gap: 9 (ref 5–15)
BUN: 16 mg/dL (ref 8–23)
CO2: 24 mmol/L (ref 22–32)
Calcium: 8.7 mg/dL — ABNORMAL LOW (ref 8.9–10.3)
Chloride: 106 mmol/L (ref 98–111)
Creatinine, Ser: 1 mg/dL (ref 0.44–1.00)
GFR calc Af Amer: >60 mL/min (ref >60)
GFR calc non Af Amer: 56 mL/min — ABNORMAL LOW (ref >60)
Glucose, Bld: 113 mg/dL — ABNORMAL HIGH (ref 70–99)
Potassium: 4.5 mmol/L (ref 3.5–5.1)
Sodium: 139 mmol/L (ref 135–145)

## 2019-02-20 SURGERY — LOWER EXTREMITY ANGIOGRAPHY
Anesthesia: General | Site: Leg Lower | Laterality: Left

## 2019-02-20 MED ORDER — METHYLPREDNISOLONE SODIUM SUCC 125 MG IJ SOLR: 125.0000 mg | Freq: Once | INTRAMUSCULAR | Status: DC | PRN

## 2019-02-20 MED ORDER — DEXAMETHASONE SODIUM PHOSPHATE 10 MG/ML IJ SOLN
INTRAMUSCULAR | Status: DC | PRN
  Administered 2019-02-20: 5 mg via INTRAVENOUS

## 2019-02-20 MED ORDER — PHENYLEPHRINE HCL (PRESSORS) 10 MG/ML IV SOLN
INTRAVENOUS | Status: AC
  Filled 2019-02-20: qty 1

## 2019-02-20 MED ORDER — FAMOTIDINE 20 MG PO TABS: 40.0000 mg | ORAL_TABLET | Freq: Once | ORAL | Status: DC | PRN

## 2019-02-20 MED ORDER — PROPOFOL 10 MG/ML IV BOLUS
INTRAVENOUS | Status: DC | PRN
  Administered 2019-02-20: 50 mg via INTRAVENOUS
  Administered 2019-02-20: 150 mg via INTRAVENOUS

## 2019-02-20 MED ORDER — IODIXANOL 320 MG/ML IV SOLN
INTRAVENOUS | Status: DC | PRN
  Administered 2019-02-20: 15:00:00 95 mL

## 2019-02-20 MED ORDER — FENTANYL CITRATE (PF) 100 MCG/2ML IJ SOLN: 12.5000 ug | Freq: Once | INTRAMUSCULAR | Status: DC | PRN

## 2019-02-20 MED ORDER — SODIUM CHLORIDE 0.9% FLUSH: 3.0000 mL | INTRAVENOUS | Status: DC | PRN

## 2019-02-20 MED ORDER — PROPOFOL 10 MG/ML IV BOLUS
INTRAVENOUS | Status: AC
  Filled 2019-02-20: qty 20

## 2019-02-20 MED ORDER — SUCCINYLCHOLINE CHLORIDE 20 MG/ML IJ SOLN
INTRAMUSCULAR | Status: AC
  Filled 2019-02-20: qty 1

## 2019-02-20 MED ORDER — HYDRALAZINE HCL 20 MG/ML IJ SOLN: 5.0000 mg | INTRAMUSCULAR | Status: DC | PRN

## 2019-02-20 MED ORDER — HEPARIN SODIUM (PORCINE) 1000 UNIT/ML IJ SOLN
INTRAMUSCULAR | Status: AC
  Filled 2019-02-20: qty 1

## 2019-02-20 MED ORDER — PHENYLEPHRINE HCL-NACL 10-0.9 MG/250ML-% IV SOLN
INTRAVENOUS | Status: DC | PRN
  Administered 2019-02-20: 50 ug/min via INTRAVENOUS

## 2019-02-20 MED ORDER — ONDANSETRON HCL 4 MG/2ML IJ SOLN: 4.0000 mg | Freq: Four times a day (QID) | INTRAMUSCULAR | Status: DC | PRN

## 2019-02-20 MED ORDER — ACETAMINOPHEN 325 MG PO TABS: 650.0000 mg | ORAL_TABLET | ORAL | Status: DC | PRN

## 2019-02-20 MED ORDER — LABETALOL HCL 5 MG/ML IV SOLN: 10.0000 mg | INTRAVENOUS | Status: DC | PRN

## 2019-02-20 MED ORDER — PHENYLEPHRINE HCL (PRESSORS) 10 MG/ML IV SOLN
INTRAVENOUS | Status: DC | PRN
  Administered 2019-02-20: 200 ug via INTRAVENOUS
  Administered 2019-02-20: 100 ug via INTRAVENOUS
  Administered 2019-02-20: 150 ug via INTRAVENOUS
  Administered 2019-02-20: 100 ug via INTRAVENOUS

## 2019-02-20 MED ORDER — ATORVASTATIN CALCIUM 10 MG PO TABS: 10.0000 mg | ORAL_TABLET | Freq: Every day | ORAL | Status: DC

## 2019-02-20 MED ORDER — KETAMINE HCL 10 MG/ML IJ SOLN
INTRAMUSCULAR | Status: DC | PRN
  Administered 2019-02-20: 30 mg via INTRAVENOUS
  Administered 2019-02-20: 20 mg via INTRAVENOUS

## 2019-02-20 MED ORDER — MORPHINE SULFATE (PF) 4 MG/ML IV SOLN: 2.0000 mg | INTRAVENOUS | Status: DC | PRN

## 2019-02-20 MED ORDER — FENTANYL CITRATE (PF) 100 MCG/2ML IJ SOLN: 25.0000 ug | INTRAMUSCULAR | Status: DC | PRN

## 2019-02-20 MED ORDER — ROCURONIUM BROMIDE 50 MG/5ML IV SOLN
INTRAVENOUS | Status: AC
  Filled 2019-02-20: qty 1

## 2019-02-20 MED ORDER — EPHEDRINE SULFATE 50 MG/ML IJ SOLN
INTRAMUSCULAR | Status: AC
  Filled 2019-02-20: qty 1

## 2019-02-20 MED ORDER — FENTANYL CITRATE (PF) 100 MCG/2ML IJ SOLN
INTRAMUSCULAR | Status: AC
  Filled 2019-02-20: qty 2

## 2019-02-20 MED ORDER — FENTANYL CITRATE (PF) 100 MCG/2ML IJ SOLN
INTRAMUSCULAR | Status: DC | PRN
  Administered 2019-02-20 (×2): 50 ug via INTRAVENOUS

## 2019-02-20 MED ORDER — EPHEDRINE SULFATE 50 MG/ML IJ SOLN
INTRAMUSCULAR | Status: DC | PRN
  Administered 2019-02-20 (×3): 10 mg via INTRAVENOUS

## 2019-02-20 MED ORDER — DIPHENHYDRAMINE HCL 50 MG/ML IJ SOLN: 50.0000 mg | Freq: Once | INTRAMUSCULAR | Status: DC | PRN

## 2019-02-20 MED ORDER — OXYCODONE HCL 5 MG PO TABS: 5.0000 mg | ORAL_TABLET | ORAL | Status: DC | PRN

## 2019-02-20 MED ORDER — KETAMINE HCL 50 MG/ML IJ SOLN
INTRAMUSCULAR | Status: AC
  Filled 2019-02-20: qty 10

## 2019-02-20 MED ORDER — SODIUM CHLORIDE (PF) 0.9 % IJ SOLN
INTRAMUSCULAR | Status: AC
  Filled 2019-02-20: qty 10

## 2019-02-20 MED ORDER — SODIUM CHLORIDE 0.9 % IV SOLN: INTRAVENOUS | Status: DC

## 2019-02-20 MED ORDER — ONDANSETRON HCL 4 MG/2ML IJ SOLN
INTRAMUSCULAR | Status: DC | PRN
  Administered 2019-02-20: 4 mg via INTRAVENOUS

## 2019-02-20 MED ORDER — SODIUM CHLORIDE 0.9% FLUSH: 3.0000 mL | Freq: Two times a day (BID) | INTRAVENOUS | Status: DC

## 2019-02-20 MED ORDER — ONDANSETRON HCL 4 MG/2ML IJ SOLN
INTRAMUSCULAR | Status: AC
  Filled 2019-02-20: qty 2

## 2019-02-20 MED ORDER — CLINDAMYCIN PHOSPHATE 300 MG/50ML IV SOLN
INTRAVENOUS | Status: AC
  Filled 2019-02-20: qty 50

## 2019-02-20 MED ORDER — HEPARIN SODIUM (PORCINE) 1000 UNIT/ML IJ SOLN
INTRAMUSCULAR | Status: DC | PRN
  Administered 2019-02-20: 5000 [IU] via INTRAVENOUS

## 2019-02-20 MED ORDER — DEXAMETHASONE SODIUM PHOSPHATE 10 MG/ML IJ SOLN
INTRAMUSCULAR | Status: AC
  Filled 2019-02-20: qty 1

## 2019-02-20 MED ORDER — LIDOCAINE HCL (CARDIAC) PF 100 MG/5ML IV SOSY
PREFILLED_SYRINGE | INTRAVENOUS | Status: DC | PRN
  Administered 2019-02-20: 80 mg via INTRAVENOUS

## 2019-02-20 MED ORDER — MIDAZOLAM HCL 2 MG/ML PO SYRP: 8.0000 mg | ORAL_SOLUTION | Freq: Once | ORAL | Status: DC | PRN

## 2019-02-20 MED ORDER — SODIUM CHLORIDE 0.9 % IV SOLN: 250.0000 mL | INTRAVENOUS | Status: DC | PRN

## 2019-02-20 MED ORDER — LIDOCAINE HCL (PF) 2 % IJ SOLN
INTRAMUSCULAR | Status: AC
  Filled 2019-02-20: qty 10

## 2019-02-20 SURGICAL SUPPLY — 18 items
BALLN ULTRVRSE 8X40X75C (BALLOONS) ×2
BALLOON ULTRVRSE 8X40X75C (BALLOONS) ×1 IMPLANT
CATH PIG 70CM (CATHETERS) ×2 IMPLANT
COVER PROBE U/S 5X48 (MISCELLANEOUS) ×2 IMPLANT
DEVICE PRESTO INFLATION (MISCELLANEOUS) ×4 IMPLANT
DEVICE SAFEGUARD 24CM (GAUZE/BANDAGES/DRESSINGS) ×2 IMPLANT
DEVICE STARCLOSE SE CLOSURE (Vascular Products) ×2 IMPLANT
GLIDEWIRE ADV .035X260CM (WIRE) ×2 IMPLANT
INTRODUCER 7FR 23CM (INTRODUCER) ×4 IMPLANT
NEEDLE ENTRY 21GA 7CM ECHOTIP (NEEDLE) ×2 IMPLANT
PACK ANGIOGRAPHY (CUSTOM PROCEDURE TRAY) ×2 IMPLANT
SET INTRO CAPELLA COAXIAL (SET/KITS/TRAYS/PACK) ×2 IMPLANT
SHEATH BRITE TIP 5FRX11 (SHEATH) ×2 IMPLANT
STENT LIFESTREAM 7X58X80 (Permanent Stent) ×4 IMPLANT
SYR MEDRAD MARK 7 150ML (SYRINGE) ×2 IMPLANT
TUBING CONTRAST HIGH PRESS 72 (TUBING) ×2 IMPLANT
WIRE J 3MM .035X145CM (WIRE) ×2 IMPLANT
WIRE MAGIC TORQUE 260C (WIRE) ×2 IMPLANT

## 2019-02-20 NOTE — H&P (Signed)
 [facility] History & Physical Update  The patient was interviewed and re-examined.  The patient's previous History and Physical has been reviewed and is unchanged.  There is no change in the plan of care. We plan to proceed with the scheduled procedure.  Hortencia Pilar, MD  02/20/2019, 12:42 PM

## 2019-02-20 NOTE — Anesthesia Procedure Notes (Signed)
Procedure Name: LMA Insertion Date/Time: 02/20/2019 1:19 PM Performed by: Lia Foyer, CRNA Pre-anesthesia Checklist: Patient identified, Emergency Drugs available, Suction available and Patient being monitored Patient Re-evaluated:Patient Re-evaluated prior to induction Oxygen Delivery Method: Circle system utilized Preoxygenation: Pre-oxygenation with 100% oxygen Induction Type: IV induction Ventilation: Mask ventilation without difficulty LMA: LMA inserted LMA Size: 3.5 Tube type: Oral Number of attempts: 2 Airway Equipment and Method: Patient positioned with wedge pillow Placement Confirmation: ETT inserted through vocal cords under direct vision,  positive ETCO2 and CO2 detector Tube secured with: Tape Dental Injury: Teeth and Oropharynx as per pre-operative assessment

## 2019-02-20 NOTE — Transfer of Care (Signed)
Immediate Anesthesia Transfer of Care Note  Patient: Kayla Maxwell  Procedure(s) Performed: LOWER EXTREMITY ANGIOGRAPHY (Left Leg Lower)  Patient Location: PACU  Anesthesia Type:General  Level of Consciousness: drowsy  Airway & Oxygen Therapy: Patient Spontanous Breathing and Patient connected to face mask oxygen  Post-op Assessment: Report given to RN and Post -op Vital signs reviewed and stable  Post vital signs: Reviewed and stable  Last Vitals:  Vitals Value Taken Time  BP 159/96 02/20/19 1530  Temp 36.1 C 02/20/19 1530  Pulse 81 02/20/19 1541  Resp 16 02/20/19 1541  SpO2 93 % 02/20/19 1541  Vitals shown include unvalidated device data.  Last Pain:  Vitals:   02/20/19 1530  TempSrc:   PainSc: 0-No pain         Complications: No apparent anesthesia complications

## 2019-02-20 NOTE — Anesthesia Preprocedure Evaluation (Addendum)
Anesthesia Evaluation  Patient identified by MRN, date of birth, ID band Patient awake    Reviewed: Allergy & Precautions, H&P , NPO status , Patient's Chart, lab work & pertinent test results  History of Anesthesia Complications (+) DIFFICULT AIRWAY and history of anesthetic complications  Airway Mallampati: IV  TM Distance: <3 FB Neck ROM: full  Mouth opening: Limited Mouth Opening  Dental  (+) Teeth Intact, Edentulous Lower   Pulmonary sleep apnea , former smoker,    Pulmonary exam normal breath sounds clear to auscultation       Cardiovascular hypertension, + CAD and + Peripheral Vascular Disease  + dysrhythmias Atrial Fibrillation  Rhythm:irregular Rate:Normal     Neuro/Psych  Headaches, PSYCHIATRIC DISORDERS Anxiety CVA, No Residual Symptoms    GI/Hepatic Neg liver ROS, GERD  Controlled,  Endo/Other  negative endocrine ROS  Renal/GU      Musculoskeletal   Abdominal   Peds  Hematology negative hematology ROS (+)   Anesthesia Other Findings Past Medical History: No date: Acid reflux     Comment:  takes Nexium daily No date: Allergy     Comment:  takes Singulair daily as needed No date: Anemia No date: Atrial fibrillation (HCC) AGE 74: Cancer ([place])     Comment:  CERVICAL cancer with removal No date: Chronic back pain     Comment:  stenosis No date: Complication of anesthesia No date: Constipation     Comment:  takes Colace daily as needed No date: Coronary artery disease No date: Difficult intubation No date: Dysrhythmia     Comment:  atrial fibrillation dx 04/2014 No date: Headache     Comment:  sinus  No date: History of blood transfusion No date: History of shingles No date: Hyperlipidemia     Comment:  takes Pravastatin daily-started taking this 6 months ago No date: Hypertension No date: Lumbar surgical wound fluid collection No date: Sleep apnea     Comment:  CPAP No date: Stroke [facility])      Comment:  TIA'S X 2 --- one last yr and one this yr...lost her               memory over 4-6 hrs. 06-04-15: Stroke ([place])     Comment:  left with right side weakness No date: TIA (transient ischemic attack)     Comment:  showed up on an MRI but she never knew it No date: Urinary frequency  Past Surgical History: No date: [place]: APPENDECTOMY 08/19/2014: BACK SURGERY     Comment:  Dr. Hal Neer ([place] otho) 1984: BREAST SURGERY     Comment:  augementation 1968: CESAREAN SECTION 2014: CHOLECYSTECTOMY     Comment:  Dr Jamal Collin No date: COLONOSCOPY No date: ESOPHAGOGASTRODUODENOSCOPY No date: HAMMER TOE SURGERY; Bilateral 01/23/2019: LOWER EXTREMITY ANGIOGRAPHY; Left     Comment:  Procedure: LOWER EXTREMITY ANGIOGRAPHY;  Surgeon:               Katha Cabal, MD;  Location: [place] CV LAB;               Service: Cardiovascular;  Laterality: Left; 07/04/2014: LUMBAR LAMINECTOMY WITH COFLEX 1 LEVEL; Bilateral     Comment:  Procedure: Laminectomy and Foraminotomy - bilateral -               Lumbar Four-Five with coflex ;  Surgeon: Karie Chimera,               MD;  Location: [place] [facility];  Service:  Neurosurgery;                Laterality: Bilateral; 08/19/2014: LUMBAR WOUND DEBRIDEMENT; N/A     Comment:  Procedure: Exploration of Lumbar wound;  Surgeon: Karie Chimera, MD;  Location: [place] [facility] ORS;  Service:               Neurosurgery;  Laterality: N/A; 08/12/2015: PERIPHERAL VASCULAR CATHETERIZATION; Left     Comment:  Procedure: Lower Extremity Angiography;  Surgeon:               Katha Cabal, MD;  Location: [place] [facility];                Service: Cardiovascular;  Laterality: Left; No date: TONSILLECTOMY 05/26/2015: VENTRAL HERNIA REPAIR; N/A     Comment:  Procedure: HERNIA REPAIR VENTRAL ADULT;  Surgeon:               Christene Lye, MD;  Location: [facility] ORS;  Service:              General;  Laterality: N/A;  BMI    Body  Mass Index: 33.93 kg/m      Reproductive/Obstetrics negative OB ROS                           Anesthesia Physical Anesthesia Plan  ASA: III  Anesthesia Plan: General LMA   Post-op Pain Management:    Induction:   PONV Risk Score and Plan: Dexamethasone, Ondansetron and Treatment may vary due to age or medical condition  Airway Management Planned:   Additional Equipment:   Intra-op Plan:   Post-operative Plan:   Informed Consent: I have reviewed the patients History and Physical, chart, labs and discussed the procedure including the risks, benefits and alternatives for the proposed anesthesia with the patient or authorized representative who has indicated his/her understanding and acceptance.     Dental Advisory Given  Plan Discussed with: Anesthesiologist  Anesthesia Plan Comments:         Anesthesia Quick Evaluation

## 2019-02-20 NOTE — Op Note (Signed)
[facility]  Percutaneous Study/Intervention Procedural Note   Date of Surgery: 02/20/2019  Surgeon:Nikitia Asbill, Dolores Lory   Pre-operative Diagnosis: Atherosclerotic occlusive disease bilateral lower extremities with rest pain of the left lower extremity  Post-operative diagnosis:  Same  Procedure(s) Performed:  1.  Abdominal aortogram  2.  Left lower extremity distal runoff third order catheter placement  3.  Percutaneous transluminal angioplasty and stent placement right common iliac artery; "kissing balloon" technique  4.  Percutaneous transluminal and plasty and stent placement left common iliac artery; "kissing balloon" technique  5.  Ultrasound guided access bilateral common femoral arteries  6.  StarClose closure device left common femoral artery manual hold on the right  Anesthesia: General by LMA  Sheath: 7 French 23 cm Pinnacle sheaths right common femoral retrograde and left common femoral retrograde  Contrast: 95  Fluoroscopy Time: 8.4   Procedure:  Kayla Maxwell a 74 y.o. female who was identified and appropriate procedural time out was performed.  The patient was then placed supine on the table and prepped and draped in the usual sterile fashion.  Ultrasound was used to evaluate the right common femoral artery.  It was echolucent and pulsatile indicating it is patent .  An ultrasound image was acquired for the permanent record.  A micropuncture needle was used to access the right common femoral artery under direct ultrasound guidance.  The microwire was then advanced under fluoroscopic guidance without difficulty followed by the micro-sheath  A 0.035 J wire was advanced without resistance and a 5Fr sheath was placed.    The pigtail catheter was then positioned at the level of T12 and an AP image of the aorta was obtained. After review the images the pigtail catheter was repositioned above the aortic bifurcation and bilateral oblique views of the  pelvis were obtained. Subsequently the detector was returned to the AP position and the pigtail catheter and advantage wire were used to cross the bifurcation.  The tip of the catheter was negotiated down to the distal left external iliac artery where a steep LAO projection of the femoral bifurcation was obtained.  Wire was reintroduced and the catheter wire combination negotiated into the SFA.  Left lower extremity distal runoff was then completed.  The detector was then returned to the distal aortic projection and a pressure line was hooked up the infrarenal abdominal aorta systolic pressure was [id number] mmHg.  The pigtail was then positioned below the distal aortic stenosis above the iliac bifurcation and the systolic pressures were noted to be 110 mmHg.  There is no gradient above and below this aortic stenosis.  Furthermore after the initial images based on calibrated measurements the most we were able to document was a 30-35 percent diameter reduction.  The pressure support that this is not hemodynamically significant.  I therefore elected to move forward with stenting of the actual aortic bifurcation.  After review the images the ultrasound was reprepped and delivered back onto the sterile field. The left common femoral was then imaged with the ultrasound it was noted to be echolucent and pulsatile indicating patency. Images recorded for the permanent record. Under real-time visualization a microneedle was inserted into the anterior wall the common femoral artery microwire was then advanced without difficulty under fluoroscopic guidance followed by placement of the micro-sheath.  A advantage wire was then negotiated under fluoroscopic guidance into the aorta.  7 French sheath was then placed.  5000 units of heparin was given and allowed to circulate for  proximally 4 minutes.  The right sheath was then upsized to a 7 [place] sheath as well after a Magic torque wire was advanced through the pigtail catheter.  Magnified images of the aortic bifurcation were then made using hand injection contrast from the femoral sheaths. After appropriate sizing a 7 x 58 lifestream stent was selected for the right and a 7 x 58 lifestream stent was selected for the left. There were then advanced and positioned just above the aortic bifurcation. Insufflation for full expansion of the stents was performed simultaneously.  Next an 8 mm x 40 Ultraverse balloon was advanced up the right the 7 mm balloon was used on the left and simultaneous inflation was performed.  The balloons were then exchanged and the 8 mm was advanced up the left side and the 7 mm balloon advanced up the right simultaneous inflations were performed.  The pigtail catheter was then introduced up the right.  Bolus injection of contrast was then used to create imaging of the aortic bifurcation.  Follow-up imaging was then performed and this demonstrated wide patency of both stents with full expansion.  The aortic bifurcation has been raised approximately 1 cm there is now wide patency of the stents..  Oblique views were then obtained of the groins in succession and Star close device is deployed without difficulty on the left manual pressure is held on the right. There were no immediate complications   Findings:   Aortogram:  The abdominal aorta is opacified with a bolus injection contrast. Demonstrates diffuse disease and approximately 3 cm above the aortic bifurcation there is a focal lesion pressure measurements do not indicate a hemodynamically significant stenosis and the actual calibrated measurement support approximately 30 to 35% diameter reduction and therefore this lesion was not stented.  There are no hemodynamically significant lesions noted until the distal aortic bifurcation where bilateral lesions are noted with the left greater than 80% in the right greater than 70% ostial iliac lesions are identified. There is moderate poststenotic dilatation noted of  the common iliac arteries as well.  Right Lower Extremity: Common femoral and visualized portions of the profunda femoris and SFA are widely patent  Left Lower Extremity: The common femoral profunda femoris and superficial femoral as well as the popliteal are widely patent.  There is three-vessel runoff to the foot with all 3 vessels crossing the ankle.  Dominant runoff is the anterior tibial which fills the dorsalis pedis and fills the pedal arch.  The posterior tibial also fills the plantar vessels.  Of note there appears to be an area of significant hyperemia of the left lateral and posterior heel.  Following placement of the iliac stents there is now wide patency of the aortic bifurcation and common iliac arteries with less than 5% residual stenosis with rapid flow through the aortic bifurcation bilaterally.  Summary:  Successful reconstruction of the distal aorta and bilateral iliac arteries  Disposition: Patient was taken to the recovery room in stable condition having tolerated the procedure well.  Kayla Maxwell 02/20/2019,3:22 PM

## 2019-02-21 ENCOUNTER — Encounter: Payer: Self-pay | Admitting: Cardiology

## 2019-02-22 NOTE — Anesthesia Postprocedure Evaluation (Signed)
Anesthesia Post Note  Patient: Teneka Baton Naumann  Procedure(s) Performed: LOWER EXTREMITY ANGIOGRAPHY (Left Leg Lower)  Patient location during evaluation: PACU Anesthesia Type: General Level of consciousness: awake and alert Pain management: pain level controlled Vital Signs Assessment: post-procedure vital signs reviewed and stable Respiratory status: spontaneous breathing, nonlabored ventilation and respiratory function stable Cardiovascular status: blood pressure returned to baseline and stable Postop Assessment: no apparent nausea or vomiting Anesthetic complications: no     Last Vitals:  Vitals:   02/20/19 1845 02/20/19 1900  BP: (!) 144/92 (!) 144/92  Pulse: 85 97  Resp: 20 18  Temp:    SpO2: 95% 94%    Last Pain:  Vitals:   02/20/19 1900  TempSrc:   PainSc: 0-No pain                 Brett Canales Sundae Maners

## 2019-03-07 ENCOUNTER — Other Ambulatory Visit (INDEPENDENT_AMBULATORY_CARE_PROVIDER_SITE_OTHER): Payer: Self-pay | Admitting: Vascular Surgery

## 2019-03-07 DIAGNOSIS — I70222 Atherosclerosis of native arteries of extremities with rest pain, left leg: Secondary | ICD-10-CM

## 2019-03-07 DIAGNOSIS — Z95820 Peripheral vascular angioplasty status with implants and grafts: Secondary | ICD-10-CM

## 2019-03-08 ENCOUNTER — Encounter (INDEPENDENT_AMBULATORY_CARE_PROVIDER_SITE_OTHER): Payer: Self-pay | Admitting: Nurse Practitioner

## 2019-03-08 ENCOUNTER — Ambulatory Visit (INDEPENDENT_AMBULATORY_CARE_PROVIDER_SITE_OTHER): Payer: Medicare Other | Admitting: Nurse Practitioner

## 2019-03-08 ENCOUNTER — Ambulatory Visit (INDEPENDENT_AMBULATORY_CARE_PROVIDER_SITE_OTHER): Payer: Medicare Other

## 2019-03-08 ENCOUNTER — Other Ambulatory Visit: Payer: Self-pay

## 2019-03-08 VITALS — BP 105/71 | HR 99 | Resp 16 | Wt 202.0 lb

## 2019-03-08 DIAGNOSIS — E782 Mixed hyperlipidemia: Secondary | ICD-10-CM | POA: Diagnosis not present

## 2019-03-08 DIAGNOSIS — I482 Chronic atrial fibrillation, unspecified: Secondary | ICD-10-CM | POA: Diagnosis not present

## 2019-03-08 DIAGNOSIS — I70222 Atherosclerosis of native arteries of extremities with rest pain, left leg: Secondary | ICD-10-CM

## 2019-03-08 DIAGNOSIS — Z95820 Peripheral vascular angioplasty status with implants and grafts: Secondary | ICD-10-CM | POA: Diagnosis not present

## 2019-03-08 MED ORDER — HYDROCODONE-ACETAMINOPHEN 5-325 MG PO TABS: 1.0000 | ORAL_TABLET | Freq: Four times a day (QID) | ORAL | 0 refills | Status: DC | PRN

## 2019-03-09 ENCOUNTER — Encounter (INDEPENDENT_AMBULATORY_CARE_PROVIDER_SITE_OTHER): Payer: Self-pay | Admitting: Nurse Practitioner

## 2019-03-09 NOTE — Progress Notes (Signed)
SUBJECTIVE:  Patient ID: Kayla Maxwell, female    DOB: 10-Apr-1945, 74 y.o.   MRN: [id number] Chief Complaint  Patient presents with  . Follow-up    [facility] 2week post angio    HPI  Kayla Maxwell is a 74 y.o. female that presents today after angiogram on 02/20/2019 as well as 01/23/2019.  The patient states that immediately following the procedure her right lower extremity pain felt better for several days and then suddenly returned.  The patient states that the pain is more of a throbbing and aching pain.  She denies any numbness or tingling sensations.  She states that the pain is not predictable as it can happen during the day or during the evening.  It happens whether she is walking or resting.  The patient also has notably bluish toes bilaterally extending approximately to nearly the midfoot.  There is minimal edema bilaterally.  Her toes are also cool bilaterally.  The pain that she describes is somewhat consistent with rest pain.  She denies any open wounds or ulcerations.  Patient is currently on Xarelto and has not missed any doses.  She denies any fever, chills, nausea, vomiting or diarrhea.  Today the patient underwent noninvasive studies.  Right lower extremity ABIs 1.09 with a left of 1.08.  The right TBI 0.80 with a left of 0.60.  Previously on 01/11/2019 the right ABI was 1.01 with a left of 0.87.  The right TBI was 0.78 with a left 0.74.  The tibial arteries in the left lower extremity has triphasic waveforms with biphasic/triphasic in the right lower extremity.  The bilateral toe waveforms are slightly dampened.  Past Medical History:  Diagnosis Date  . Acid reflux    takes Nexium daily  . Allergy    takes Singulair daily as needed  . Anemia   . Atrial fibrillation ([place])   . Cancer ([place]) AGE 68   CERVICAL cancer with removal  . Chronic back pain    stenosis  . Complication of anesthesia   . Constipation    takes Colace daily as needed  . Coronary artery disease   .  Difficult intubation   . Dysrhythmia    atrial fibrillation dx 04/2014  . Headache    sinus   . History of blood transfusion   . History of shingles   . Hyperlipidemia    takes Pravastatin daily-started taking this 6 months ago  . Hypertension   . Lumbar surgical wound fluid collection   . Sleep apnea    CPAP  . Stroke ([place])    TIA'S X 2 --- one last yr and one this yr...lost her memory over 4-6 hrs.  . Stroke ([place]) 06-04-15   left with right side weakness  . TIA (transient ischemic attack)    showed up on an MRI but she never knew it  . Urinary frequency     Past Surgical History:  Procedure Laterality Date  . ABDOMINAL HYSTERECTOMY    . APPENDECTOMY  1963  . BACK SURGERY  08/19/2014   Dr. Hal Neer ([place] [facility])  . BREAST SURGERY  1984   augementation  . [place]  . CHOLECYSTECTOMY  2014   Dr Jamal Collin  . COLONOSCOPY    . ESOPHAGOGASTRODUODENOSCOPY    . HAMMER TOE SURGERY Bilateral   . LOWER EXTREMITY ANGIOGRAPHY Left 01/23/2019   Procedure: LOWER EXTREMITY ANGIOGRAPHY;  Surgeon: Katha Cabal, MD;  Location: [place] [place] [facility];  Service: Cardiovascular;  Laterality:  Left;  . LOWER EXTREMITY ANGIOGRAPHY Left 02/20/2019   Procedure: LOWER EXTREMITY ANGIOGRAPHY;  Surgeon: Katha Cabal, MD;  Location: [place] [facility];  Service: Cardiovascular;  Laterality: Left;  . LUMBAR LAMINECTOMY WITH COFLEX 1 LEVEL Bilateral 07/04/2014   Procedure: Laminectomy and Foraminotomy - bilateral - Lumbar Four-Five with coflex ;  Surgeon: Karie Chimera, MD;  Location: [place];  Service: Neurosurgery;  Laterality: Bilateral;  . LUMBAR WOUND DEBRIDEMENT N/A 08/19/2014   Procedure: Exploration of Lumbar wound;  Surgeon: Karie Chimera, MD;  Location: [facility];  Service: Neurosurgery;  Laterality: N/A;  . PERIPHERAL VASCULAR CATHETERIZATION Left 08/12/2015   Procedure: Lower Extremity Angiography;  Surgeon: Katha Cabal, MD;  Location: [place];   Service: Cardiovascular;  Laterality: Left;  . TONSILLECTOMY    . VENTRAL HERNIA REPAIR N/A 05/26/2015   Procedure: HERNIA REPAIR VENTRAL ADULT;  Surgeon: Christene Lye, MD;  Location: [facility] ORS;  Service: General;  Laterality: N/A;    Social History   Socioeconomic History  . Marital status: Divorced    Spouse name: Not on file  . Number of children: 1  . Years of education: College  . Highest education level: Some college, no degree  Occupational History  . Occupation: Retired  Tobacco Use  . Smoking status: Former Smoker    Packs/day: 1.00    Years: 47.00    Pack years: 47.00    Quit date: 08/19/2014    Years since quitting: 4.5  . Smokeless tobacco: Former Systems developer    Quit date: 08/19/2014  Substance and Sexual Activity  . Alcohol use: No  . Drug use: No  . Sexual activity: Not on file  Other Topics Concern  . Not on file  Social History Narrative  . Not on file   Social Determinants of Health   Financial Resource Strain:   . Difficulty of Paying Living Expenses: Not on file  Food Insecurity:   . Worried About Charity fundraiser in the Last Year: Not on file  . Ran Out of Food in the Last Year: Not on file  Transportation Needs:   . Lack of Transportation (Medical): Not on file  . Lack of Transportation (Non-Medical): Not on file  Physical Activity: Inactive  . Days of Exercise per Week: 0 days  . Minutes of Exercise per Session: 0 min  Stress:   . Feeling of Stress : Not on file  Social Connections: Unknown  . Frequency of Communication with Friends and Family: Patient refused  . Frequency of Social Gatherings with Friends and Family: Patient refused  . Attends Religious Services: Patient refused  . Active Member of Clubs or Organizations: Patient refused  . Attends Archivist Meetings: Patient refused  . Marital Status: Patient refused  Intimate Partner Violence: Unknown  . Fear of Current or Ex-Partner: Patient refused  . Emotionally Abused:  Patient refused  . Physically Abused: Patient refused  . Sexually Abused: Patient refused    Family History  Problem Relation Age of Onset  . Alzheimer's disease Mother   . Heart attack Father   . Pancreatic cancer Sister   . Healthy Brother     Allergies  Allergen Reactions  . Codeine Other (See Comments)    Makes her feel "crazy"  . Penicillins Swelling    Did it involve swelling of the face/tongue/throat, SOB, or low BP? Unknown Did it involve sudden or severe rash/hives, skin peeling, or any reaction on the inside of  your mouth or nose? No Did you need to seek medical attention at a hospital or doctor's office? Unknown When did it last happen?Childhood allergy If all above answers are "NO", may proceed with cephalosporin use.   . Adhesive [Tape] Hives  . Sulfa Antibiotics Nausea Only and Rash     Review of Systems   Review of Systems: Negative Unless Checked Constitutional: [] Weight loss  [] Fever  [] Chills Cardiac: [] Chest pain   []  Atrial Fibrillation  [] Palpitations   [] Shortness of breath when laying flat   [] Shortness of breath with exertion. [] Shortness of breath at rest Vascular:  [] Pain in legs with walking   [] Pain in legs with standing [] Pain in legs when laying flat   [x] Claudication    [x] Pain in feet when laying flat    [] History of DVT   [] Phlebitis   [x] Swelling in legs   [x] Varicose veins   [] Non-healing ulcers Pulmonary:   [] Uses home oxygen   [] Productive cough   [] Hemoptysis   [] Wheeze  [] COPD   [] Asthma Neurologic:  [] Dizziness   [] Seizures  [] Blackouts [] History of stroke   [] History of TIA  [] Aphasia   [] Temporary Blindness   [] Weakness or numbness in arm   [] Weakness or numbness in leg Musculoskeletal:   [] Joint swelling   [] Joint pain   [] Low back pain  []  History of Knee Replacement [] Arthritis [] back Surgeries  []  Spinal Stenosis    Hematologic:  [] Easy bruising  [] Easy bleeding   [] Hypercoagulable state   [] Anemic Gastrointestinal:   [] Diarrhea   [] Vomiting  [] Gastroesophageal reflux/heartburn   [] Difficulty swallowing. [] Abdominal pain Genitourinary:  [] Chronic kidney disease   [] Difficult urination  [] Anuric   [] Blood in urine [] Frequent urination  [] Burning with urination   [] Hematuria Skin:  [] Rashes   [] Ulcers [] Wounds Psychological:  [] History of anxiety   []  History of major depression  []  Memory Difficulties      OBJECTIVE:   Physical Exam  BP 105/71 (BP Location: Right Arm)   Pulse 99   Resp 16   Wt 202 lb (91.6 kg)   BMI 33.61 kg/m   Gen: WD/WN, NAD Head: [place]/AT, No temporalis wasting.  Ear/Nose/Throat: Hearing grossly intact, nares w/o erythema or drainage Eyes: PER, EOMI, sclera nonicteric.  Neck: Supple, no masses.  No JVD.  Pulmonary:  Good air movement, no use of accessory muscles.  Cardiac: RRR Vascular:  Bilateral toe starting at mid foot has bluish tinge with coolness Vessel Right Left  Radial Palpable Palpable  Dorsalis Pedis Palpable Palpable  Posterior Tibial Palpable Palpable   Gastrointestinal: soft, non-distended. No guarding/no peritoneal signs.  Musculoskeletal: M/S 5/5 throughout.  No deformity or atrophy.  Neurologic: Pain and light touch intact in extremities.  Symmetrical.  Speech is fluent. Motor exam as listed above. Psychiatric: Judgment intact, Mood & affect appropriate for pt's clinical situation. Dermatologic: No Venous rashes. No Ulcers Noted.  No changes consistent with cellulitis. Lymph : No Cervical lymphadenopathy, no lichenification or skin changes of chronic lymphedema.       ASSESSMENT AND PLAN:  1. Atherosclerosis of native artery of left lower extremity with rest pain ([place]) The patient needs to have pain in her bilateral lower extremities with the left foot being worse than the right.  Following the patient's procedure this is atypical given her noninvasive studies as well as her presentation.  Embolization is possible however not likely given the toe  tracings, in addition to the fact that it is both of her lower extremities.  There is the  possibility for microvascular disease.  This could also be a reflexive response due to trauma from reperfusion injury.  We will proceed with obtaining ultrasounds of the patient's bilateral iliac arteries to evaluate the status of stents as well as bilateral lower extremity arterial duplexes to determine if there are any signs symptoms of distal disease.  If both of these tests are unremarkable we will try Pletal to see if that gives some resolution.  Finally if neither of these are effective we may proceed with angiogram to determine if there is possible calcification lower in the foot that may be amenable to intervention. - VAS US AORTA/IVC/ILIACS; Future - VAS [place] LOWER EXTREMITY ARTERIAL DUPLEX; Future  2. Chronic atrial fibrillation (HCC) Chronic atrial fibrillation increases the risk of distal emboli, however the fact that the patient has been on Xarelto and has been consistent with her medications makes this very less likely.  Also the patient has good toe tracings which is also not consistent with embolization.  Finally, embolization typically only affects 2-3 toes at a time not both feet equally.  Patient will continue with Xarelto.  We will continue to follow with cardiology for atrial fibrillation management.  3. Mixed hyperlipidemia Continue statin as ordered and reviewed, no changes at this time    Current Outpatient Medications on File Prior to Visit  Medication Sig Dispense Refill  . albuterol (PROVENTIL HFA;VENTOLIN HFA) 108 (90 Base) MCG/ACT inhaler Inhale 2 puffs into the lungs every 6 (six) hours as needed for wheezing or shortness of breath. 1 Inhaler 5  . aspirin EC 81 MG EC tablet Take 1 tablet (81 mg total) by mouth daily. 30 tablet 1  . atorvastatin (LIPITOR) 40 MG tablet TAKE 1 TABLET BY MOUTH EVERY DAY IN THE EVENING (Patient taking differently: Take 40 mg by mouth every evening. ) 90  tablet 2  . FLUoxetine (PROZAC) 20 MG capsule TAKE 1 CAPSULE BY MOUTH EVERY DAY (Patient taking differently: Take 20 mg by mouth daily. ) 90 capsule 1  . furosemide (LASIX) 40 MG tablet TAKE 1 TABLET BY MOUTH EVERY DAY (Patient taking differently: Take 40 mg by mouth daily as needed for edema. ) 90 tablet 1  . KLOR-CON M20 20 MEQ tablet TAKE 1 TABLET BY MOUTH EVERY DAY (Patient taking differently: Take 20 mEq by mouth daily as needed (when taking lasix). ) 90 tablet 1  . lisinopril (PRINIVIL,ZESTRIL) 2.5 MG tablet TAKE 1 TABLET BY MOUTH EVERY DAY (Patient taking differently: Take 2.5 mg by mouth daily. ) 90 tablet 1  . metoprolol tartrate (LOPRESSOR) 25 MG tablet TAKE 2 TABLETS BY MOUTH TWICE A DAY (Patient taking differently: Take 50 mg by mouth 2 (two) times daily. ) 360 tablet 1  . montelukast (SINGULAIR) 10 MG tablet Take 1 tablet (10 mg total) by mouth at bedtime. (Patient taking differently: Take 10 mg by mouth daily as needed (allergies). ) 30 tablet 3  . omeprazole (PRILOSEC) 40 MG capsule TAKE 1 CAPSULE BY MOUTH EVERY DAY (Patient taking differently: Take 40 mg by mouth daily. ) 90 capsule 1  . rivaroxaban (XARELTO) 20 MG TABS tablet Take 20 mg by mouth daily with supper.    . traMADol (ULTRAM) 50 MG tablet Take 50 mg by mouth every 6 (six) hours as needed for moderate pain.     No current facility-administered medications on file prior to visit.    There are no Patient Instructions on file for this visit. No follow-ups on file.   Lucretia Roers  Ow[place] Shark, NP  This note was completed with Sales executive.  Any errors are purely unintentional.

## 2019-03-12 ENCOUNTER — Ambulatory Visit: Payer: Medicare Other | Attending: Internal Medicine

## 2019-03-12 DIAGNOSIS — Z23 Encounter for immunization: Secondary | ICD-10-CM | POA: Insufficient documentation

## 2019-03-12 NOTE — Progress Notes (Signed)
   Covid-19 Vaccination Clinic  Name:  Kayla Maxwell    MRN: [id number] DOB: Jul 18, 1945  03/12/2019  [name] was observed post Covid-19 immunization for 15 minutes without incident. She was provided with Vaccine Information Sheet and instruction to access the V-Safe system.   [name] was instructed to call 911 with any severe reactions post vaccine: Marland Kitchen Difficulty breathing  . Swelling of face and throat  . A fast heartbeat  . A bad rash all over body  . Dizziness and weakness   Immunizations Administered    Name Date Dose VIS Date Route   Pfizer COVID-19 Vaccine 03/12/2019 11:05 AM 0.3 mL 12/15/2018 Intramuscular   Manufacturer: [place]   Lot: [id number]   [place]: [id number]

## 2019-04-02 ENCOUNTER — Encounter (INDEPENDENT_AMBULATORY_CARE_PROVIDER_SITE_OTHER): Payer: Medicare Other

## 2019-04-02 ENCOUNTER — Ambulatory Visit (INDEPENDENT_AMBULATORY_CARE_PROVIDER_SITE_OTHER): Payer: Medicare Other | Admitting: Vascular Surgery

## 2019-04-04 ENCOUNTER — Ambulatory Visit: Payer: Medicare Other | Attending: Internal Medicine

## 2019-04-04 DIAGNOSIS — Z23 Encounter for immunization: Secondary | ICD-10-CM

## 2019-04-04 NOTE — Progress Notes (Signed)
   Covid-19 Vaccination Clinic  Name:  Kayla Maxwell    MRN: [id number] DOB: Jul 03, 1945  04/04/2019  [name] was observed post Covid-19 immunization for 15 minutes without incident. She was provided with Vaccine Information Sheet and instruction to access the V-Safe system.   [name] was instructed to call 911 with any severe reactions post vaccine: Marland Kitchen Difficulty breathing  . Swelling of face and throat  . A fast heartbeat  . A bad rash all over body  . Dizziness and weakness   Immunizations Administered    Name Date Dose VIS Date Route   Pfizer COVID-19 Vaccine 04/04/2019 11:18 AM 0.3 mL 12/15/2018 Intramuscular   Manufacturer: [place]   Lot: [id number]   [place]: [id number]

## 2019-04-18 ENCOUNTER — Other Ambulatory Visit: Payer: Self-pay | Admitting: Physician Assistant

## 2019-04-18 NOTE — Telephone Encounter (Signed)
Best contact: [phone] Pt is completely out of medication and would like refill as soon as possible. Pt would like a call back from nurse

## 2019-04-18 NOTE — Telephone Encounter (Signed)
Requested medication (s) are due for refill today: yes  Requested medication (s) are on the active medication list: yes  Last refill:  03/08/2019  Future visit scheduled: no  Notes to clinic:  this refill cannot be delegated    Requested Prescriptions  Pending Prescriptions Disp Refills   HYDROcodone-acetaminophen (NORCO) 5-325 MG tablet 40 tablet 0    Sig: Take 1-2 tablets by mouth every 6 (six) hours as needed for moderate pain or severe pain.      Not Delegated - Analgesics:  Opioid Agonist Combinations Failed - 04/18/2019  9:36 AM      Failed - This refill cannot be delegated      Failed - Urine Drug Screen completed in last 360 days.      Failed - Valid encounter within last 6 months    Recent Outpatient Visits           2 years ago Atherosclerotic peripheral vascular disease with intermittent claudication [facility])   [place], Clearnce Sorrel, [place]   2 years ago Salmonella gastroenteritis   [place], [place], [place]   2 years ago Diarrhea, unspecified type   [place], Vickki Muff, [place]   2 years ago Localized edema   [place], Clearnce Sorrel, [place]   3 years ago Localized edema   [facility] [place], [place]

## 2019-04-18 NOTE — Telephone Encounter (Signed)
Copied from [place] [id number]. Topic: Quick Communication - Rx Refill/Question >> Apr 18, 2019  9:31 AM Leward Quan A wrote: Medication: HYDROcodone-acetaminophen ([place]) 5-325 MG tablet  Has the patient contacted their pharmacy? Yes.   (Agent: If no, request that the patient contact the pharmacy for the refill.) (Agent: If yes, when and what did the pharmacy advise?)  Preferred Pharmacy (with phone number or street name): [id number] - [place], [address]  Phone:  [phone] Fax:  [phone]     Agent: Please be advised that RX refills may take up to 3 business days. We ask that you follow-up with your pharmacy.

## 2019-04-20 ENCOUNTER — Other Ambulatory Visit (INDEPENDENT_AMBULATORY_CARE_PROVIDER_SITE_OTHER): Payer: Self-pay | Admitting: Nurse Practitioner

## 2019-04-20 ENCOUNTER — Telehealth (INDEPENDENT_AMBULATORY_CARE_PROVIDER_SITE_OTHER): Payer: Self-pay | Admitting: Vascular Surgery

## 2019-04-20 MED ORDER — HYDROCODONE-ACETAMINOPHEN 5-325 MG PO TABS: 1.0000 | ORAL_TABLET | Freq: Four times a day (QID) | ORAL | 0 refills | Status: DC | PRN

## 2019-04-20 NOTE — Telephone Encounter (Signed)
I sent a small refill.  If the pain has suddenly become constant there may be a change in her vascular status.  We will need to bring her in for studies before we can do any further refills

## 2019-04-20 NOTE — Telephone Encounter (Signed)
Called saying that she is in constant pain in her legs, (has been for about a week) and would like a refill [facility] [place]

## 2019-04-20 NOTE — Telephone Encounter (Signed)
A detail message has been left on patient voicemail from note below

## 2019-05-05 ENCOUNTER — Other Ambulatory Visit: Payer: Self-pay | Admitting: Physician Assistant

## 2019-05-05 DIAGNOSIS — F3341 Major depressive disorder, recurrent, in partial remission: Secondary | ICD-10-CM

## 2019-05-05 NOTE — Telephone Encounter (Signed)
Requested medication (s) are due for refill today: yes  Requested medication (s) are on the active medication list: yes  Last refill:  04/10/18  Future visit scheduled: no  Notes to clinic:  called pt and pt stated she will call back after she arranges transportation. Last OV > 3 months ago- routing to office as per protocol   Requested Prescriptions  Pending Prescriptions Disp Refills   FLUoxetine (PROZAC) 20 MG capsule [Pharmacy Med Name: FLUOXETINE HCL 20 MG CAPSULE] 90 capsule 1    Sig: TAKE 1 CAPSULE BY MOUTH EVERY DAY      Psychiatry:  Antidepressants - SSRI Failed - 05/05/2019 10:10 AM      Failed - Valid encounter within last 6 months    Recent Outpatient Visits           2 years ago Atherosclerotic peripheral vascular disease with intermittent claudication [facility])   [facility] [place], Clearnce Sorrel, [place]   2 years ago Salmonella gastroenteritis   [place], [place], [place]   2 years ago Diarrhea, unspecified type   [place], Vickki Muff, [place]   2 years ago Localized edema   [place], Clearnce Sorrel, [place]   3 years ago Localized edema   [facility] [place], [place], [place]

## 2019-05-06 ENCOUNTER — Other Ambulatory Visit: Payer: Self-pay | Admitting: Physician Assistant

## 2019-05-06 DIAGNOSIS — Z8679 Personal history of other diseases of the circulatory system: Secondary | ICD-10-CM

## 2019-05-06 NOTE — Telephone Encounter (Signed)
Requested medication (s) are due for refill today: yes  Requested medication (s) are on the active medication list: yes  Last refill:  04/10/18  Future visit scheduled: no  Notes to clinic:  called pt and pt stated she will call back after she arranges transportation. Last OV > 3 months ago- routing to office as per protocol   Requested Prescriptions  Pending Prescriptions Disp Refills   lisinopril (ZESTRIL) 2.5 MG tablet [Pharmacy Med Name: LISINOPRIL 2.5 MG TABLET] 90 tablet 1    Sig: TAKE 1 TABLET BY MOUTH EVERY DAY      Cardiovascular:  ACE Inhibitors Failed - 05/06/2019  9:49 AM      Failed - Valid encounter within last 6 months    Recent Outpatient Visits           2 years ago Atherosclerotic peripheral vascular disease with intermittent claudication [facility])   [facility] [place], Clearnce Sorrel, [place]   2 years ago Salmonella gastroenteritis   [place], [place], [place]   2 years ago Diarrhea, unspecified type   [place], [place], [place]   2 years ago Localized edema   [place], [place], [place]   3 years ago Localized edema   [facility] [place] M, [place]              Passed - Cr in normal range and within 180 days    Creatinine  Date Value Ref Range Status  11/10/2013 0.73 0.60 - 1.30 mg/dL Final   Creatinine, Ser  Date Value Ref Range Status  02/20/2019 1.00 0.44 - 1.00 mg/dL Final          Passed - K in normal range and within 180 days    Potassium  Date Value Ref Range Status  02/20/2019 4.5 3.5 - 5.1 mmol/L Final  11/10/2013 4.2 3.5 - 5.1 mmol/L Final          Passed - Patient is not pregnant      Passed - Last BP in normal range    BP Readings from Last 1 Encounters:  03/08/19 105/71

## 2019-05-09 ENCOUNTER — Telehealth: Payer: Self-pay

## 2019-05-09 NOTE — Telephone Encounter (Signed)
Copied from [place] [id number]. Topic: Appointment Scheduling - Scheduling Inquiry for Clinic >> May 09, 2019  9:51 AM Mathis Bud wrote: Reason for CRM: Patient is requesting a call back from [place], patient states she got missed call regarding setting up an appt. [phone]

## 2019-05-14 ENCOUNTER — Ambulatory Visit (INDEPENDENT_AMBULATORY_CARE_PROVIDER_SITE_OTHER): Payer: Medicare Other

## 2019-05-14 ENCOUNTER — Other Ambulatory Visit: Payer: Self-pay

## 2019-05-14 ENCOUNTER — Ambulatory Visit (INDEPENDENT_AMBULATORY_CARE_PROVIDER_SITE_OTHER): Payer: Medicare Other | Admitting: Nurse Practitioner

## 2019-05-14 ENCOUNTER — Encounter (INDEPENDENT_AMBULATORY_CARE_PROVIDER_SITE_OTHER): Payer: Self-pay | Admitting: Nurse Practitioner

## 2019-05-14 VITALS — BP 161/81 | HR 58 | Ht 65.0 in | Wt 204.0 lb

## 2019-05-14 DIAGNOSIS — I70222 Atherosclerosis of native arteries of extremities with rest pain, left leg: Secondary | ICD-10-CM

## 2019-05-14 DIAGNOSIS — I10 Essential (primary) hypertension: Secondary | ICD-10-CM | POA: Diagnosis not present

## 2019-05-14 DIAGNOSIS — I872 Venous insufficiency (chronic) (peripheral): Secondary | ICD-10-CM | POA: Diagnosis not present

## 2019-05-14 DIAGNOSIS — I6523 Occlusion and stenosis of bilateral carotid arteries: Secondary | ICD-10-CM

## 2019-05-14 MED ORDER — GABAPENTIN 300 MG PO CAPS: 300.0000 mg | ORAL_CAPSULE | Freq: Every day | ORAL | 3 refills | Status: DC

## 2019-05-14 MED ORDER — HYDROCODONE-ACETAMINOPHEN 5-325 MG PO TABS: 1.0000 | ORAL_TABLET | Freq: Four times a day (QID) | ORAL | 0 refills | Status: DC | PRN

## 2019-05-14 NOTE — Progress Notes (Signed)
Subjective:    Patient ID: Kayla Maxwell, female    DOB: [birth year], 74 y.o.   MRN: [id number] Chief Complaint  Patient presents with  . Follow-up    U/S Follow up    [place] is a 74 y.o. female that presents today after angiogram on 02/20/2019 as well as 01/23/2019. The patient continues to have lower extremity discoloration in addition to a throbbing and aching pain that is concentrated in the first two toes bilaterally.  This pain happens mostly at night when she is in bed and it can stop her from being able to rest.  The bluish discoloration has decreased since last visit.  She denies any open wounds or ulcerations.  She has scattered varicosities bilaterally.  She continues on Xarelto without any missed doses.   The patient underwent an aorta illiac duplex however the study was limited due to obesity and patient discomfort.  The bilateral common illiac arteries, and external illiac arteries were not adequately visualized. Abdominal aorta measured 2.2 cm.    The right lower extremity has mostly triphasic with some biphasic waveforms throughout except for some monophasic waveforms at the distal PTA.  The Left lower extremity has primarily biphasic/traphasic waveforms with monophasic waveforms in the distal peroneal arteries.     Review of Systems  Skin: Positive for color change (toes).  Neurological:       Pain in toes ( a squeezing)  All other systems reviewed and are negative.      Objective:   Physical Exam Vitals reviewed.  Constitutional:      Appearance: Normal appearance.  HENT:     Head: Normocephalic.  Cardiovascular:     Rate and Rhythm: Normal rate and regular rhythm.     Pulses:          Dorsalis pedis pulses are 2+ on the right side and 2+ on the left side.       Posterior tibial pulses are 1+ on the right side and 1+ on the left side.  Pulmonary:     Effort: Pulmonary effort is normal.     Breath sounds: Normal breath sounds.  Skin:    Comments: Blue  toes  Neurological:     Mental Status: She is alert and oriented to person, place, and time.  Psychiatric:        Mood and Affect: Mood normal.        Behavior: Behavior normal.        Thought Content: Thought content normal.        Judgment: Judgment normal.     BP (!) 161/81   Pulse (!) 58   Ht 5\' 5"  (1.651 m)   Wt 204 lb (92.5 kg)   BMI 33.95 kg/m   Past Medical History:  Diagnosis Date  . Acid reflux    takes Nexium daily  . Allergy    takes Singulair daily as needed  . Anemia   . Atrial fibrillation ([place])   . Cancer ([place]) AGE 51   CERVICAL cancer with removal  . Chronic back pain    stenosis  . Complication of anesthesia   . Constipation    takes Colace daily as needed  . Coronary artery disease   . Difficult intubation   . Dysrhythmia    atrial fibrillation dx 04/2014  . Headache    sinus   . History of blood transfusion   . History of shingles   . Hyperlipidemia    takes Pravastatin daily-started  taking this 6 months ago  . Hypertension   . Lumbar surgical wound fluid collection   . Sleep apnea    CPAP  . Stroke ([place])    TIA'S X 2 --- one last yr and one this yr...lost her memory over 4-6 hrs.  . Stroke ([place]) 06-04-15   left with right side weakness  . TIA (transient ischemic attack)    showed up on an MRI but she never knew it  . Urinary frequency     Social History   Socioeconomic History  . Marital status: Divorced    Spouse name: Not on file  . Number of children: 1  . Years of education: College  . Highest education level: Some college, no degree  Occupational History  . Occupation: Retired  Tobacco Use  . Smoking status: Former Smoker    Packs/day: 1.00    Years: 47.00    Pack years: 47.00    Quit date: 08/19/2014    Years since quitting: 4.7  . Smokeless tobacco: Former Systems developer    Quit date: 08/19/2014  Substance and Sexual Activity  . Alcohol use: No  . Drug use: No  . Sexual activity: Not on file  Other Topics Concern  . Not on  file  Social History Narrative  . Not on file   Social Determinants of Health   Financial Resource Strain:   . Difficulty of Paying Living Expenses:   Food Insecurity:   . Worried About Charity fundraiser in the Last Year:   . Arboriculturist in the Last Year:   Transportation Needs:   . Film/video editor (Medical):   Marland Kitchen Lack of Transportation (Non-Medical):   Physical Activity: Inactive  . Days of Exercise per Week: 0 days  . Minutes of Exercise per Session: 0 min  Stress:   . Feeling of Stress :   Social Connections: Unknown  . Frequency of Communication with Friends and Family: Patient refused  . Frequency of Social Gatherings with Friends and Family: Patient refused  . Attends Religious Services: Patient refused  . Active Member of Clubs or Organizations: Patient refused  . Attends Archivist Meetings: Patient refused  . Marital Status: Patient refused  Intimate Partner Violence: Unknown  . Fear of Current or Ex-Partner: Patient refused  . Emotionally Abused: Patient refused  . Physically Abused: Patient refused  . Sexually Abused: Patient refused    Past Surgical History:  Procedure Laterality Date  . ABDOMINAL HYSTERECTOMY    . APPENDECTOMY  1963  . BACK SURGERY  08/19/2014   Dr. Hal Neer ([place] [facility])  . BREAST SURGERY  1984   augementation  . [place]  . CHOLECYSTECTOMY  2014   Dr Jamal Collin  . COLONOSCOPY    . ESOPHAGOGASTRODUODENOSCOPY    . HAMMER TOE SURGERY Bilateral   . LOWER EXTREMITY ANGIOGRAPHY Left 01/23/2019   Procedure: LOWER EXTREMITY ANGIOGRAPHY;  Surgeon: Katha Cabal, MD;  Location: [place] [facility];  Service: Cardiovascular;  Laterality: Left;  . LOWER EXTREMITY ANGIOGRAPHY Left 02/20/2019   Procedure: LOWER EXTREMITY ANGIOGRAPHY;  Surgeon: Katha Cabal, MD;  Location: [place] [facility];  Service: Cardiovascular;  Laterality: Left;  . LUMBAR LAMINECTOMY WITH COFLEX 1 LEVEL Bilateral 07/04/2014    Procedure: Laminectomy and Foraminotomy - bilateral - Lumbar Four-Five with coflex ;  Surgeon: Karie Chimera, MD;  Location: [place] [facility];  Service: Neurosurgery;  Laterality: Bilateral;  . LUMBAR WOUND DEBRIDEMENT N/A 08/19/2014  Procedure: Exploration of Lumbar wound;  Surgeon: Karie Chimera, MD;  Location: [place] [facility];  Service: Neurosurgery;  Laterality: N/A;  . PERIPHERAL VASCULAR CATHETERIZATION Left 08/12/2015   Procedure: Lower Extremity Angiography;  Surgeon: Katha Cabal, MD;  Location: [place] [facility];  Service: Cardiovascular;  Laterality: Left;  . TONSILLECTOMY    . VENTRAL HERNIA REPAIR N/A 05/26/2015   Procedure: HERNIA REPAIR VENTRAL ADULT;  Surgeon: Christene Lye, MD;  Location: [facility];  Service: General;  Laterality: N/A;    Family History  Problem Relation Age of Onset  . Alzheimer's disease Mother   . Heart attack Father   . Pancreatic cancer Sister   . Healthy Brother     Allergies  Allergen Reactions  . Codeine Other (See Comments)    Makes her feel "crazy"  . Penicillins Swelling    Did it involve swelling of the face/tongue/throat, SOB, or low BP? Unknown Did it involve sudden or severe rash/hives, skin peeling, or any reaction on the inside of your mouth or nose? No Did you need to seek medical attention at a hospital or doctor's office? Unknown When did it last happen?Childhood allergy If all above answers are "NO", may proceed with cephalosporin use.   . Adhesive [Tape] Hives  . Sulfa Antibiotics Nausea Only and Rash       Assessment & Plan:   1. Venous insufficiency of both lower extremities I suspect that the patient's venous insufficiency also factors into her lower extremity discoloration.  The patient is advised to elevate her lower extremities as much as possible in addition to exercising and utilizing medical grade 1 compression stockings to help with her venous insufficiency.   2. Benign essential HTN Continue  antihypertensive medications as already ordered, these medications have been reviewed and there are no changes at this time.   3. Atherosclerosis of native artery of left lower extremity with rest pain ([place]) I suspect that some of the patient's pain may be related to neuropathy.  We will begin having her utilize a Neurontin during the evening to see if this makes a difference in her pain.  We will also give a one-time refill of Norco in case this is not helpful.  If this indeed helps the patient's lower extremity pain and discomfort we may refer the patient to a neurologist for further work-up and treatment. - gabapentin (NEURONTIN) 300 MG capsule; Take 1 capsule (300 mg total) by mouth at bedtime.  Dispense: 30 capsule; Refill: 3 - HYDROcodone-acetaminophen (NORCO) 5-325 MG tablet; Take 1 tablet by mouth every 6 (six) hours as needed for moderate pain or severe pain.  Dispense: 25 tablet; Refill: 0   Current Outpatient Medications on File Prior to Visit  Medication Sig Dispense Refill  . albuterol (PROVENTIL HFA;VENTOLIN HFA) 108 (90 Base) MCG/ACT inhaler Inhale 2 puffs into the lungs every 6 (six) hours as needed for wheezing or shortness of breath. 1 Inhaler 5  . aspirin EC 81 MG EC tablet Take 1 tablet (81 mg total) by mouth daily. 30 tablet 1  . atorvastatin (LIPITOR) 40 MG tablet TAKE 1 TABLET BY MOUTH EVERY DAY IN THE EVENING (Patient taking differently: Take 40 mg by mouth every evening. ) 90 tablet 2  . FLUoxetine (PROZAC) 20 MG capsule TAKE 1 CAPSULE BY MOUTH EVERY DAY (Patient taking differently: Take 20 mg by mouth daily. ) 90 capsule 1  . lisinopril (PRINIVIL,ZESTRIL) 2.5 MG tablet TAKE 1 TABLET BY MOUTH EVERY DAY (Patient taking differently: Take  2.5 mg by mouth daily. ) 90 tablet 1  . metoprolol tartrate (LOPRESSOR) 25 MG tablet TAKE 2 TABLETS BY MOUTH TWICE A DAY (Patient taking differently: Take 50 mg by mouth 2 (two) times daily. ) 360 tablet 1  . montelukast (SINGULAIR) 10 MG  tablet Take 1 tablet (10 mg total) by mouth at bedtime. (Patient taking differently: Take 10 mg by mouth daily as needed (allergies). ) 30 tablet 3  . omeprazole (PRILOSEC) 40 MG capsule TAKE 1 CAPSULE BY MOUTH EVERY DAY (Patient taking differently: Take 40 mg by mouth daily. ) 90 capsule 1  . rivaroxaban (XARELTO) 20 MG TABS tablet Take 20 mg by mouth daily with supper.    . traMADol (ULTRAM) 50 MG tablet Take 50 mg by mouth every 6 (six) hours as needed for moderate pain.    . furosemide (LASIX) 40 MG tablet TAKE 1 TABLET BY MOUTH EVERY DAY (Patient not taking: No sig reported) 90 tablet 1  . KLOR-CON M20 20 MEQ tablet TAKE 1 TABLET BY MOUTH EVERY DAY (Patient not taking: No sig reported) 90 tablet 1   No current facility-administered medications on file prior to visit.    There are no Patient Instructions on file for this visit. No follow-ups on file.   Kris Hartmann, NP

## 2019-05-17 ENCOUNTER — Encounter (INDEPENDENT_AMBULATORY_CARE_PROVIDER_SITE_OTHER): Payer: Medicare Other

## 2019-05-17 ENCOUNTER — Ambulatory Visit (INDEPENDENT_AMBULATORY_CARE_PROVIDER_SITE_OTHER): Payer: Medicare Other | Admitting: Vascular Surgery

## 2019-05-18 ENCOUNTER — Ambulatory Visit (INDEPENDENT_AMBULATORY_CARE_PROVIDER_SITE_OTHER): Payer: Medicare Other | Admitting: Physician Assistant

## 2019-05-18 ENCOUNTER — Other Ambulatory Visit: Payer: Self-pay

## 2019-05-18 ENCOUNTER — Encounter: Payer: Self-pay | Admitting: Physician Assistant

## 2019-05-18 DIAGNOSIS — M48062 Spinal stenosis, lumbar region with neurogenic claudication: Secondary | ICD-10-CM | POA: Diagnosis not present

## 2019-05-18 DIAGNOSIS — I6523 Occlusion and stenosis of bilateral carotid arteries: Secondary | ICD-10-CM

## 2019-05-18 DIAGNOSIS — I70222 Atherosclerosis of native arteries of extremities with rest pain, left leg: Secondary | ICD-10-CM | POA: Diagnosis not present

## 2019-05-18 DIAGNOSIS — I10 Essential (primary) hypertension: Secondary | ICD-10-CM | POA: Diagnosis not present

## 2019-05-18 DIAGNOSIS — E782 Mixed hyperlipidemia: Secondary | ICD-10-CM | POA: Diagnosis not present

## 2019-05-18 MED ORDER — HYDROCODONE-ACETAMINOPHEN 5-325 MG PO TABS: 1.0000 | ORAL_TABLET | Freq: Three times a day (TID) | ORAL | 0 refills | Status: DC | PRN

## 2019-05-18 NOTE — Progress Notes (Signed)
Virtual telephone visit    Virtual Visit via Telephone Note   This visit type was conducted due to national recommendations for restrictions regarding the COVID-19 Pandemic (e.g. social distancing) in an effort to limit this patient's exposure and mitigate transmission in our community. Due to her co-morbid illnesses, this patient is at least at moderate risk for complications without adequate follow up. This format is felt to be most appropriate for this patient at this time. The patient did not have access to video technology or had technical difficulties with video requiring transitioning to audio format only (telephone). Physical exam was limited to content and character of the telephone converstion.    Patient location: Home Provider location: [facility]   Visit Date: 05/18/2019  Today's healthcare provider: Mar Daring, PA-C   Chief Complaint  Patient presents with  . Follow-up   Subjective    HPI   Lipid/Cholesterol, Follow-up  Last lipid panel Other pertinent labs  Lab Results  Component Value Date   CHOL 134 04/04/2017   HDL 51 04/04/2017   LDLCALC 50 04/04/2017   TRIG 165 (H) 04/04/2017   CHOLHDL 2.6 04/04/2017   Lab Results  Component Value Date   ALT 11 04/04/2017   AST 15 04/04/2017   PLT 354 04/04/2017   TSH 0.446 (L) 11/10/2013     She was last seen for this 2 years ago.  Management since that visit includes continue Pravastatin 40 mg.  She reports excellent compliance with treatment. She is not having side effects.   Symptoms: No chest pain No chest pressure/discomfort  No dyspnea No lower extremity edema  No numbness or tingling of extremity No orthopnea  No palpitations No paroxysmal nocturnal dyspnea  No speech difficulty No syncope   Current diet: well balanced Current exercise: none  The 10-year ASCVD risk score Mikey Bussing DC Jr., et al., 2013) is:  27.2%  --------------------------------------------------------------------------------------------------- Atherosclerotic peripheral vascular disease: Patient has known PAD, CAD followed by Dr. Lupita Leash surgery. She is scheduled to follow up 08/06/2019. She is on Xarelto, pravastatin and ASA. [place] having chronic pain in her toes and feet. Was felt to have been rest pain, but now possibly more neuropathy. Has been using Norco 1-2 tabs daily for the pain. Recently placed on gabapentin 300mg  at bedtime. Reports it does help her sleep. Not noticed much change in foot pain yet.   Hypertension: Stable with current medications.    Patient Active Problem List   Diagnosis Date Noted  . Atheroembolism of foot, left ([place]) 01/11/2019  . Venous insufficiency of both lower extremities 11/02/2017  . Pain and swelling of lower leg 06/20/2017  . Benign essential HTN 11/01/2016  . SOBOE (shortness of breath on exertion) 11/01/2016  . Gastroenteritis 08/16/2016  . Bilateral carotid artery stenosis 03/12/2016  . Fine tremor 03/12/2016  . PAD (peripheral artery disease) ([place]) 01/14/2016  . Atherosclerosis of native arteries of extremity with rest pain ([place]) 11/10/2015  . Cough 06/09/2015  . Abnormal blood sugar 04/11/2015  . Anxiety 11/19/2014  . PTSD (post-traumatic stress disorder) 10/29/2014  . Allergic rhinitis 09/05/2014  . HLD (hyperlipidemia) 09/05/2014  . Mild memory disturbance 09/05/2014  . H/O stroke without residual deficits 09/05/2014  . Edema 09/05/2014  . Spinal stenosis, lumbar 07/04/2014  . MI (mitral incompetence) 05/08/2014  . TI (tricuspid incompetence) 05/08/2014  . Chronic atrial fibrillation ([place]) 05/08/2014  . History of tobacco abuse 04/29/2014  . Gastro-esophageal reflux disease without esophagitis 04/29/2014  . Obstructive apnea 04/29/2014  .  Atrial fibrillation by electrocardiography ([place]) 04/29/2014  . Cigarette nicotine dependence with nicotine-induced disorder  04/29/2014  . Neuritis or radiculitis due to rupture of lumbar intervertebral disc 09/21/2013  . DDD (degenerative disc disease), lumbar 08/24/2013  . Bursitis, trochanteric 06/18/2013  . Nerve root inflammation 06/18/2013  . Arthralgia of hip 06/04/2013  . Calculus of gallbladder with other cholecystitis, without mention of obstruction 04/27/2012   Past Medical History:  Diagnosis Date  . Acid reflux    takes Nexium daily  . Allergy    takes Singulair daily as needed  . Anemia   . Atrial fibrillation ([place])   . Cancer ([place]) AGE 49   CERVICAL cancer with removal  . Chronic back pain    stenosis  . Complication of anesthesia   . Constipation    takes Colace daily as needed  . Coronary artery disease   . Difficult intubation   . Dysrhythmia    atrial fibrillation dx 04/2014  . Headache    sinus   . History of blood transfusion   . History of shingles   . Hyperlipidemia    takes Pravastatin daily-started taking this 6 months ago  . Hypertension   . Lumbar surgical wound fluid collection   . Sleep apnea    CPAP  . Stroke ([place])    TIA'S X 2 --- one last yr and one this yr...lost her memory over 4-6 hrs.  . Stroke ([place]) 06-04-15   left with right side weakness  . TIA (transient ischemic attack)    showed up on an MRI but she never knew it  . Urinary frequency       Medications: Outpatient Medications Prior to Visit  Medication Sig  . albuterol (PROVENTIL HFA;VENTOLIN HFA) 108 (90 Base) MCG/ACT inhaler Inhale 2 puffs into the lungs every 6 (six) hours as needed for wheezing or shortness of breath.  Marland Kitchen aspirin EC 81 MG EC tablet Take 1 tablet (81 mg total) by mouth daily.  Marland Kitchen atorvastatin (LIPITOR) 40 MG tablet TAKE 1 TABLET BY MOUTH EVERY DAY IN THE EVENING (Patient taking differently: Take 40 mg by mouth every evening. )  . FLUoxetine (PROZAC) 20 MG capsule TAKE 1 CAPSULE BY MOUTH EVERY DAY (Patient taking differently: Take 20 mg by mouth daily. )  . gabapentin (NEURONTIN)  300 MG capsule Take 1 capsule (300 mg total) by mouth at bedtime.  Marland Kitchen HYDROcodone-acetaminophen (NORCO) 5-325 MG tablet Take 1 tablet by mouth every 6 (six) hours as needed for moderate pain or severe pain.  Marland Kitchen lisinopril (PRINIVIL,ZESTRIL) 2.5 MG tablet TAKE 1 TABLET BY MOUTH EVERY DAY (Patient taking differently: Take 2.5 mg by mouth daily. )  . metoprolol tartrate (LOPRESSOR) 25 MG tablet TAKE 2 TABLETS BY MOUTH TWICE A DAY (Patient taking differently: Take 50 mg by mouth 2 (two) times daily. )  . montelukast (SINGULAIR) 10 MG tablet Take 1 tablet (10 mg total) by mouth at bedtime. (Patient taking differently: Take 10 mg by mouth daily as needed (allergies). )  . omeprazole (PRILOSEC) 40 MG capsule TAKE 1 CAPSULE BY MOUTH EVERY DAY (Patient taking differently: Take 40 mg by mouth daily. )  . rivaroxaban (XARELTO) 20 MG TABS tablet Take 20 mg by mouth daily with supper.  . traMADol (ULTRAM) 50 MG tablet Take 50 mg by mouth every 6 (six) hours as needed for moderate pain.  . furosemide (LASIX) 40 MG tablet TAKE 1 TABLET BY MOUTH EVERY DAY (Patient not taking: No sig reported)  . KLOR-CON  M20 20 MEQ tablet TAKE 1 TABLET BY MOUTH EVERY DAY (Patient not taking: No sig reported)   No facility-administered medications prior to visit.    Review of Systems  Last CBC Lab Results  Component Value Date   WBC 10.0 04/04/2017   HGB 12.0 04/04/2017   HCT 39.2 04/04/2017   MCV 72 (L) 04/04/2017   MCH 22.1 (L) 04/04/2017   RDW 18.2 (H) 04/04/2017   PLT 354 [id number]   Last metabolic panel Lab Results  Component Value Date   GLUCOSE 113 (H) 02/20/2019   NA 139 02/20/2019   K 4.5 02/20/2019   CL 106 02/20/2019   CO2 24 02/20/2019   BUN 16 02/20/2019   CREATININE 1.00 02/20/2019   GFRNONAA 56 (L) 02/20/2019   GFRAA >60 02/20/2019   CALCIUM 8.7 (L) 02/20/2019   PROT 6.7 04/04/2017   ALBUMIN 4.4 04/04/2017   LABGLOB 2.3 04/04/2017   AGRATIO 1.9 04/04/2017   BILITOT 0.5 04/04/2017   ALKPHOS  87 04/04/2017   AST 15 04/04/2017   ALT 11 04/04/2017   ANIONGAP 9 02/20/2019   Last lipids Lab Results  Component Value Date   CHOL 134 04/04/2017   HDL 51 04/04/2017   LDLCALC 50 04/04/2017   TRIG 165 (H) 04/04/2017   CHOLHDL 2.6 04/04/2017   Last hemoglobin A1c Lab Results  Component Value Date   HGBA1C 6.0 (H) 04/04/2017      Objective    There were no vitals taken for this visit. BP Readings from Last 3 Encounters:  05/14/19 (!) 161/81  03/08/19 105/71  02/20/19 (!) 144/92   Wt Readings from Last 3 Encounters:  05/14/19 204 lb (92.5 kg)  03/08/19 202 lb (91.6 kg)  02/20/19 203 lb 14.8 oz (92.5 kg)        Assessment & Plan     1. Atherosclerosis of native artery of left lower extremity with rest pain ([place]) Most recent arterial US showed mostly triphasic and biphasic wave forms with only monophasic waveforms in the PTA on the right and the peroneals on the left. Felt toe and foot pain may be more neurogenic in nature. Patient does have h/o spinal stenosis and radiculitis due to lumbar disc rupture. Will refill Norco as below for patient. Advised I can take on this Rx but when she comes for her annual we will obtain her urine drug screen. She agrees. Continue Gabapentin as well. Can increase dose as tolerated.  - HYDROcodone-acetaminophen (NORCO) 5-325 MG tablet; Take 1-2 tablets by mouth every 8 (eight) hours as needed for moderate pain or severe pain.  Dispense: 90 tablet; Refill: 0  2. Benign essential HTN Stable. Continue Lisinopril 2.5mg  daily, metoprolol 25mg  BID.   3. Spinal stenosis of lumbar region with neurogenic claudication Continue Gabapentin 300mg  q hs.   4. Mixed hyperlipidemia Stable. Continue atorvastatin 40mg .    No follow-ups on file.    I discussed the assessment and treatment plan with the patient. The patient was provided an opportunity to ask questions and all were answered. The patient agreed with the plan and demonstrated an  understanding of the instructions.   The patient was advised to call back or seek an in-person evaluation if the symptoms worsen or if the condition fails to improve as anticipated.  I provided 12 minutes of non-face-to-face time during this encounter.  Reynolds Bowl, PA-C, have reviewed all documentation for this visit. The documentation on 05/18/19 for the exam, diagnosis, procedures, and orders are all accurate  and complete.  [place] [facility] [phone] (phone) [phone] (fax)  [place]

## 2019-05-29 ENCOUNTER — Telehealth: Payer: Self-pay | Admitting: Physician Assistant

## 2019-05-29 NOTE — Telephone Encounter (Signed)
Patient advise that script was sent over on 05/24/2019. Patient will contact pharmacy and call us back if they didn't receive script.

## 2019-05-29 NOTE — Telephone Encounter (Signed)
Medication Refill - Medication Hydrocodone- Acetaminophen 5-325 mg tablet  Has the patient contacted their pharmacy? No.   (Agent: If no, request that the patient contact the pharmacy for the refill.) (Agent: If yes, when and what did the pharmacy advise?) No   Preferred Pharmacy (with phone number or street name):  [facility] [place] [facility] [place]   Agent: Please be advised that RX refills may take up to 3 business days. We ask that you follow-up with your pharmacy.

## 2019-06-05 ENCOUNTER — Other Ambulatory Visit: Payer: Self-pay | Admitting: Physician Assistant

## 2019-06-05 DIAGNOSIS — Z8679 Personal history of other diseases of the circulatory system: Secondary | ICD-10-CM

## 2019-06-05 DIAGNOSIS — F3341 Major depressive disorder, recurrent, in partial remission: Secondary | ICD-10-CM

## 2019-07-02 DIAGNOSIS — R42 Dizziness and giddiness: Secondary | ICD-10-CM | POA: Diagnosis not present

## 2019-07-05 ENCOUNTER — Telehealth: Payer: Self-pay

## 2019-07-05 DIAGNOSIS — Z1239 Encounter for other screening for malignant neoplasm of breast: Secondary | ICD-10-CM

## 2019-07-05 LAB — HM MAMMOGRAPHY

## 2019-07-05 NOTE — Telephone Encounter (Signed)
Copied from [place] [id number]. Topic: General - Inquiry >> Jul 05, 2019  1:06 PM Percell Belt A wrote: Reason for CRM: [facility] called and pt it there right now and needs to see if Tawanna Sat could sign off on Mammogram order?  please contact them at [phone]

## 2019-07-05 NOTE — Telephone Encounter (Signed)
Called [facility] and gave the verbal order for the mammogram.   Thanks,   -Mickel Baas

## 2019-07-10 ENCOUNTER — Encounter: Payer: Self-pay | Admitting: Physician Assistant

## 2019-07-18 ENCOUNTER — Other Ambulatory Visit: Payer: Self-pay | Admitting: Physician Assistant

## 2019-07-18 DIAGNOSIS — I70222 Atherosclerosis of native arteries of extremities with rest pain, left leg: Secondary | ICD-10-CM

## 2019-07-18 MED ORDER — HYDROCODONE-ACETAMINOPHEN 5-325 MG PO TABS: 1.0000 | ORAL_TABLET | Freq: Three times a day (TID) | ORAL | 0 refills | Status: DC | PRN

## 2019-07-18 NOTE — Telephone Encounter (Signed)
Requested medication (s) are due for refill today: yes  Requested medication (s) are on the active medication list: yes  Last refill: 05/18/19  #90  0 refills  Future visit scheduled no  Notes to clinic  not delegated  Requested Prescriptions  Pending Prescriptions Disp Refills   HYDROcodone-acetaminophen (NORCO) 5-325 MG tablet 90 tablet 0    Sig: Take 1-2 tablets by mouth every 8 (eight) hours as needed for moderate pain or severe pain.      Not Delegated - Analgesics:  Opioid Agonist Combinations Failed - 07/18/2019  2:57 PM      Failed - This refill cannot be delegated      Failed - Urine Drug Screen completed in last 360 days.      Passed - Valid encounter within last 6 months    Recent Outpatient Visits           2 months ago Benign essential HTN   [place], [place], [place]   2 years ago Atherosclerotic peripheral vascular disease with intermittent claudication [facility])   [facility] [place], [place], [place]   2 years ago Salmonella gastroenteritis   [place], [place], [place]   2 years ago Diarrhea, unspecified type   [place], [place]ickki [place]uff, [place]   3 years ago Localized edema   [facility] [place], [place], [place]

## 2019-07-18 NOTE — Telephone Encounter (Signed)
Pt called in to request a refill for HYDROcodone-acetaminophen (NORCO) 5-325 MG tablet    [facility]  [id number] [place], [address] Phone:  [phone]  Fax:  [phone]

## 2019-08-02 ENCOUNTER — Telehealth: Payer: Self-pay

## 2019-08-02 ENCOUNTER — Encounter: Payer: Self-pay | Admitting: *Deleted

## 2019-08-02 ENCOUNTER — Ambulatory Visit: Payer: Self-pay

## 2019-08-02 DIAGNOSIS — R112 Nausea with vomiting, unspecified: Secondary | ICD-10-CM

## 2019-08-02 DIAGNOSIS — K529 Noninfective gastroenteritis and colitis, unspecified: Secondary | ICD-10-CM | POA: Diagnosis not present

## 2019-08-02 MED ORDER — ONDANSETRON HCL 4 MG PO TABS: 4.0000 mg | ORAL_TABLET | Freq: Three times a day (TID) | ORAL | 0 refills | Status: DC | PRN

## 2019-08-02 NOTE — Telephone Encounter (Signed)
[facility]  Will need appt if symptoms continue

## 2019-08-02 NOTE — Telephone Encounter (Signed)
Patient advised.

## 2019-08-02 NOTE — Telephone Encounter (Signed)
[facility] Originally it was not on normal it was phone in.

## 2019-08-02 NOTE — Telephone Encounter (Signed)
Pt. Reports she has had nausea and diarrhea x 2 days. "I feel awful." Has weakness. No fever. Vomited 1-2. Diarrhea "is a little better today. Was watery yesterday." No one else in family is sick. "The nausea is so bad I can't eat anything." Keeping Pedialyte down. Will try Imodium today. [facility] [place]. Please advise pt.  Answer Assessment - Initial Assessment Questions 1. NAUSEA SEVERITY: "How bad is the nausea?" (e.g., mild, moderate, severe; dehydration, weight loss)   - MILD: loss of appetite without change in eating habits   - MODERATE: decreased oral intake without significant weight loss, dehydration, or malnutrition   - SEVERE: inadequate caloric or fluid intake, significant weight loss, symptoms of dehydration     Moderate 2. ONSET: "When did the nausea begin?"     2 days ago 3. VOMITING: "Any vomiting?" If Yes, ask: "How many times today?"     1-2 4. RECURRENT SYMPTOM: "Have you had nausea before?" If Yes, ask: "When was the last time?" "What happened that time?"     No 5. CAUSE: "What do you think is causing the nausea?"     Unsure 6. PREGNANCY: "Is there any chance you are pregnant?" (e.g., unprotected intercourse, missed birth control pill, broken condom)     n/a  Protocols used: NAUSEA-A-AH

## 2019-08-02 NOTE — Telephone Encounter (Signed)
Chart says it is a phone in   Copied from [place] [id number]. Topic: General - Inquiry >> Aug 02, 2019  3:15 PM Greggory Keen D wrote: Reason for CRM: Pt's daughter [name] called back saying they are [place]ia waiting for her mom's rx of Zofran to be sent to [place].  CB@ [phone]

## 2019-08-06 ENCOUNTER — Other Ambulatory Visit (INDEPENDENT_AMBULATORY_CARE_PROVIDER_SITE_OTHER): Payer: Self-pay | Admitting: Nurse Practitioner

## 2019-08-06 ENCOUNTER — Ambulatory Visit (INDEPENDENT_AMBULATORY_CARE_PROVIDER_SITE_OTHER): Payer: Medicare Other | Admitting: Vascular Surgery

## 2019-08-06 ENCOUNTER — Encounter (INDEPENDENT_AMBULATORY_CARE_PROVIDER_SITE_OTHER): Payer: Medicare Other

## 2019-08-06 DIAGNOSIS — I70222 Atherosclerosis of native arteries of extremities with rest pain, left leg: Secondary | ICD-10-CM

## 2019-08-06 DIAGNOSIS — Z95820 Peripheral vascular angioplasty status with implants and grafts: Secondary | ICD-10-CM

## 2019-08-07 ENCOUNTER — Encounter: Payer: Self-pay | Admitting: Emergency Medicine

## 2019-08-07 ENCOUNTER — Telehealth: Payer: Self-pay

## 2019-08-07 ENCOUNTER — Emergency Department
Admission: EM | Admit: 2019-08-07 | Discharge: 2019-08-07 | Disposition: A | Discharge status: Home / Self Care | Payer: Medicare Other | Attending: Emergency Medicine | Admitting: Emergency Medicine

## 2019-08-07 ENCOUNTER — Ambulatory Visit: Payer: Self-pay

## 2019-08-07 ENCOUNTER — Other Ambulatory Visit: Payer: Self-pay

## 2019-08-07 DIAGNOSIS — R5383 Other fatigue: Secondary | ICD-10-CM | POA: Diagnosis not present

## 2019-08-07 DIAGNOSIS — Z79899 Other long term (current) drug therapy: Secondary | ICD-10-CM | POA: Diagnosis not present

## 2019-08-07 DIAGNOSIS — I10 Essential (primary) hypertension: Secondary | ICD-10-CM | POA: Insufficient documentation

## 2019-08-07 DIAGNOSIS — I4891 Unspecified atrial fibrillation: Secondary | ICD-10-CM | POA: Diagnosis not present

## 2019-08-07 DIAGNOSIS — I2510 Atherosclerotic heart disease of native coronary artery without angina pectoris: Secondary | ICD-10-CM | POA: Diagnosis not present

## 2019-08-07 DIAGNOSIS — R531 Weakness: Secondary | ICD-10-CM | POA: Insufficient documentation

## 2019-08-07 DIAGNOSIS — K529 Noninfective gastroenteritis and colitis, unspecified: Secondary | ICD-10-CM | POA: Insufficient documentation

## 2019-08-07 DIAGNOSIS — R112 Nausea with vomiting, unspecified: Secondary | ICD-10-CM | POA: Diagnosis present

## 2019-08-07 DIAGNOSIS — Z7901 Long term (current) use of anticoagulants: Secondary | ICD-10-CM | POA: Insufficient documentation

## 2019-08-07 DIAGNOSIS — Z7982 Long term (current) use of aspirin: Secondary | ICD-10-CM | POA: Diagnosis not present

## 2019-08-07 DIAGNOSIS — E860 Dehydration: Secondary | ICD-10-CM | POA: Diagnosis not present

## 2019-08-07 DIAGNOSIS — Z8541 Personal history of malignant neoplasm of cervix uteri: Secondary | ICD-10-CM | POA: Insufficient documentation

## 2019-08-07 DIAGNOSIS — Z87891 Personal history of nicotine dependence: Secondary | ICD-10-CM | POA: Insufficient documentation

## 2019-08-07 LAB — URINALYSIS, COMPLETE (UACMP) WITH MICROSCOPIC
Bilirubin Urine: NEGATIVE
Glucose, UA: NEGATIVE mg/dL
Hgb urine dipstick: NEGATIVE
Ketones, ur: 5 mg/dL — AB
Leukocytes,Ua: NEGATIVE
Nitrite: NEGATIVE
Protein, ur: 100 mg/dL — AB
Specific Gravity, Urine: 1.028 (ref 1.005–1.030)
pH: 5 (ref 5.0–8.0)

## 2019-08-07 LAB — COMPREHENSIVE METABOLIC PANEL
ALT: 14 U/L (ref 0–44)
AST: 24 U/L (ref 15–41)
Albumin: 4.1 g/dL (ref 3.5–5.0)
Alkaline Phosphatase: 73 U/L (ref 38–126)
Anion gap: 12 (ref 5–15)
BUN: 26 mg/dL — ABNORMAL HIGH (ref 8–23)
CO2: 26 mmol/L (ref 22–32)
Calcium: 9.4 mg/dL (ref 8.9–10.3)
Chloride: 98 mmol/L (ref 98–111)
Creatinine, Ser: 1.31 mg/dL — ABNORMAL HIGH (ref 0.44–1.00)
GFR calc Af Amer: 46 mL/min — ABNORMAL LOW (ref >60)
GFR calc non Af Amer: 40 mL/min — ABNORMAL LOW (ref >60)
Glucose, Bld: 129 mg/dL — ABNORMAL HIGH (ref 70–99)
Potassium: 3.8 mmol/L (ref 3.5–5.1)
Sodium: 136 mmol/L (ref 135–145)
Total Bilirubin: 1.3 mg/dL — ABNORMAL HIGH (ref 0.3–1.2)
Total Protein: 6.7 g/dL (ref 6.5–8.1)

## 2019-08-07 LAB — CBC
HCT: 49.6 % — ABNORMAL HIGH (ref 36.0–46.0)
Hemoglobin: 14.7 g/dL (ref 12.0–15.0)
MCH: 23.1 pg — ABNORMAL LOW (ref 26.0–34.0)
MCHC: 29.6 g/dL — ABNORMAL LOW (ref 30.0–36.0)
MCV: 78.1 fL — ABNORMAL LOW (ref 80.0–100.0)
Platelets: 333 10*3/uL (ref 150–400)
RBC: 6.35 MIL/uL — ABNORMAL HIGH (ref 3.87–5.11)
RDW: 18.2 % — ABNORMAL HIGH (ref 11.5–15.5)
WBC: 13.7 10*3/uL — ABNORMAL HIGH (ref 4.0–10.5)
nRBC: 0 % (ref 0.0–0.2)

## 2019-08-07 LAB — LIPASE, BLOOD: Lipase: 61 U/L — ABNORMAL HIGH (ref 11–51)

## 2019-08-07 LAB — TROPONIN I (HIGH SENSITIVITY): Troponin I (High Sensitivity): 28 ng/L — ABNORMAL HIGH (ref <18)

## 2019-08-07 MED ORDER — ONDANSETRON 4 MG PO TBDP: 4.0000 mg | ORAL_TABLET | Freq: Three times a day (TID) | ORAL | 0 refills | Status: DC | PRN

## 2019-08-07 MED ORDER — ONDANSETRON HCL 4 MG/2ML IJ SOLN
4.0000 mg | Freq: Once | INTRAMUSCULAR | Status: AC
  Administered 2019-08-07: 4 mg via INTRAVENOUS
  Filled 2019-08-07: qty 2

## 2019-08-07 MED ORDER — SODIUM CHLORIDE 0.9 % IV SOLN
1000.0000 mL | Freq: Once | INTRAVENOUS | Status: AC
  Administered 2019-08-07: 1000 mL via INTRAVENOUS

## 2019-08-07 MED ORDER — SODIUM CHLORIDE 0.9% FLUSH: 3.0000 mL | Freq: Once | INTRAVENOUS | Status: DC

## 2019-08-07 NOTE — ED Notes (Signed)
Ambulated pt from room 13 to DC hallway and pt tolerated well. Pt reports feeling much better.

## 2019-08-07 NOTE — Telephone Encounter (Signed)
Pt. And her friend calling to report pt. Is [place] sick - having 3 loose stools a day. Feels very weak. Drinking liquids and voiding, "but she can hardly walk." Instructed to go to ED for evaluation. Verbalizes understanding. Reason for Disposition . Patient sounds very sick or weak to the triager  Answer Assessment - Initial Assessment Questions 1. DIARRHEA SEVERITY: "How bad is the diarrhea?" "How many extra stools have you had in the past 24 hours than normal?"    - NO DIARRHEA (SCALE 0)   - MILD (SCALE 1-3): Few loose or mushy BMs; increase of 1-3 stools over normal daily number of stools; mild increase in ostomy output.   -  MODERATE (SCALE 4-7): Increase of 4-6 stools daily over normal; moderate increase in ostomy output. * SEVERE (SCALE 8-10; OR 'WORST POSSIBLE'): Increase of 7 or more stools daily over normal; moderate increase in ostomy output; incontinence.     3 2. ONSET: "When did the diarrhea begin?"      Last week 3. BM CONSISTENCY: "How loose or watery is the diarrhea?"      Loose 4. VOMITING: "Are you also vomiting?" If Yes, ask: "How many times in the past 24 hours?"      No 5. ABDOMINAL PAIN: "Are you having any abdominal pain?" If Yes, ask: "What does it feel like?" (e.g., crampy, dull, intermittent, constant)      Cramping 6. ABDOMINAL PAIN SEVERITY: If present, ask: "How bad is the pain?"  (e.g., Scale 1-10; mild, moderate, or severe)   - MILD (1-3): doesn't interfere with normal activities, abdomen soft and not tender to touch    - MODERATE (4-7): interferes with normal activities or awakens from sleep, tender to touch    - SEVERE (8-10): excruciating pain, doubled over, unable to do any normal activities       6 7. ORAL INTAKE: If vomiting, "Have you been able to drink liquids?" "How much fluids have you had in the past 24 hours?"     Yes 8. HYDRATION: "Any signs of dehydration?" (e.g., dry mouth [not just dry lips], too weak to stand, dizziness, new weight loss) "When  did you last urinate?"     No 9. EXPOSURE: "Have you traveled to a foreign country recently?" "Have you been exposed to anyone with diarrhea?" "Could you have eaten any food that was spoiled?"     No 10. ANTIBIOTIC USE: "Are you taking antibiotics now or have you taken antibiotics in the past 2 months?"       No 11. OTHER SYMPTOMS: "Do you have any other symptoms?" (e.g., fever, blood in stool)       No 12. PREGNANCY: "Is there any chance you are pregnant?" "When was your last menstrual period?"       No  Protocols used: DIARRHEA-A-AH

## 2019-08-07 NOTE — ED Provider Notes (Signed)
[facility] Emergency Department Provider Note   ____________________________________________    I have reviewed the triage vital signs and the nursing notes.   HISTORY  Chief Complaint Nausea and Diarrhea     HPI Kayla Maxwell is a 74 y.o. female with a history of atrial fibrillation who presents with complaints of nausea vomiting and diarrhea for nearly a week.  Patient reports she felt generalized weakness today and fatigued.  She denies fevers or chills.  No sick contacts.  Some abdominal cramping intermittently.  No chest pain.  No shortness of breath.  No cough.  No body aches.  Has had both Covid vaccines.  Watery brown stool, nonbloody vomitus.  She thinks that she is dehydrated.  Past Medical History:  Diagnosis Date  . Acid reflux    takes Nexium daily  . Allergy    takes Singulair daily as needed  . Anemia   . Atrial fibrillation ([place])   . Cancer ([place]) AGE 47   CERVICAL cancer with removal  . Chronic back pain    stenosis  . Complication of anesthesia   . Constipation    takes Colace daily as needed  . Coronary artery disease   . Difficult intubation   . Dysrhythmia    atrial fibrillation dx 04/2014  . Headache    sinus   . History of blood transfusion   . History of shingles   . Hyperlipidemia    takes Pravastatin daily-started taking this 6 months ago  . Hypertension   . Lumbar surgical wound fluid collection   . Sleep apnea    CPAP  . Stroke ([place])    TIA'S X 2 --- one last yr and one this yr...lost her memory over 4-6 hrs.  . Stroke ([place]) 06-04-15   left with right side weakness  . TIA (transient ischemic attack)    showed up on an MRI but she never knew it  . Urinary frequency     Patient Active Problem List   Diagnosis Date Noted  . Atheroembolism of foot, left ([place]) 01/11/2019  . Venous insufficiency of both lower extremities 11/02/2017  . Pain and swelling of lower leg 06/20/2017  . Benign essential HTN  11/01/2016  . SOBOE (shortness of breath on exertion) 11/01/2016  . Gastroenteritis 08/16/2016  . Bilateral carotid artery stenosis 03/12/2016  . Fine tremor 03/12/2016  . PAD (peripheral artery disease) ([place]) 01/14/2016  . Atherosclerosis of native arteries of extremity with rest pain ([place]) 11/10/2015  . Cough 06/09/2015  . Abnormal blood sugar 04/11/2015  . Anxiety 11/19/2014  . PTSD (post-traumatic stress disorder) 10/29/2014  . Allergic rhinitis 09/05/2014  . HLD (hyperlipidemia) 09/05/2014  . Mild memory disturbance 09/05/2014  . H/O stroke without residual deficits 09/05/2014  . Edema 09/05/2014  . Spinal stenosis, lumbar 07/04/2014  . MI (mitral incompetence) 05/08/2014  . TI (tricuspid incompetence) 05/08/2014  . Chronic atrial fibrillation ([place]) 05/08/2014  . History of tobacco abuse 04/29/2014  . Gastro-esophageal reflux disease without esophagitis 04/29/2014  . Obstructive apnea 04/29/2014  . Atrial fibrillation by electrocardiography ([place]) 04/29/2014  . Cigarette nicotine dependence with nicotine-induced disorder 04/29/2014  . Neuritis or radiculitis due to rupture of lumbar intervertebral disc 09/21/2013  . DDD (degenerative disc disease), lumbar 08/24/2013  . Bursitis, trochanteric 06/18/2013  . Nerve root inflammation 06/18/2013  . Arthralgia of hip 06/04/2013  . Calculus of gallbladder with other cholecystitis, without mention of obstruction 04/27/2012    Past Surgical History:  Procedure  Laterality Date  . ABDOMINAL HYSTERECTOMY    . APPENDECTOMY  1963  . BACK SURGERY  08/19/2014   Dr. Hal Neer ([place] [facility])  . BREAST SURGERY  1984   augementation  . [place]  . CHOLECYSTECTOMY  2014   Dr Jamal Collin  . COLONOSCOPY    . ESOPHAGOGASTRODUODENOSCOPY    . HAMMER TOE SURGERY Bilateral   . LOWER EXTREMITY ANGIOGRAPHY Left 01/23/2019   Procedure: LOWER EXTREMITY ANGIOGRAPHY;  Surgeon: Katha Cabal, MD;  Location: [facility];   Service: Cardiovascular;  Laterality: Left;  . LOWER EXTREMITY ANGIOGRAPHY Left 02/20/2019   Procedure: LOWER EXTREMITY ANGIOGRAPHY;  Surgeon: Katha Cabal, MD;  Location: [facility];  Service: Cardiovascular;  Laterality: Left;  . LUMBAR LAMINECTOMY WITH COFLEX 1 LEVEL Bilateral 07/04/2014   Procedure: Laminectomy and Foraminotomy - bilateral - Lumbar Four-Five with coflex ;  Surgeon: Karie Chimera, MD;  Location: [facility];  Service: Neurosurgery;  Laterality: Bilateral;  . LUMBAR WOUND DEBRIDEMENT N/A 08/19/2014   Procedure: Exploration of Lumbar wound;  Surgeon: Karie Chimera, MD;  Location: [facility];  Service: Neurosurgery;  Laterality: N/A;  . PERIPHERAL VASCULAR CATHETERIZATION Left 08/12/2015   Procedure: Lower Extremity Angiography;  Surgeon: Katha Cabal, MD;  Location: [facility];  Service: Cardiovascular;  Laterality: Left;  . TONSILLECTOMY    . VENTRAL HERNIA REPAIR N/A 05/26/2015   Procedure: HERNIA REPAIR VENTRAL ADULT;  Surgeon: Christene Lye, MD;  Location: [facility] ORS;  Service: General;  Laterality: N/A;    Prior to Admission medications   Medication Sig Start Date End Date Taking? Authorizing Provider  albuterol (PROVENTIL HFA;VENTOLIN HFA) 108 (90 Base) MCG/ACT inhaler Inhale 2 puffs into the lungs every 6 (six) hours as needed for wheezing or shortness of breath. 06/09/16   Mar Daring, PA-C  aspirin EC 81 MG EC tablet Take 1 tablet (81 mg total) by mouth daily. 06/08/15   Henreitta Leber, MD  atorvastatin (LIPITOR) 40 MG tablet TAKE 1 TABLET BY MOUTH EVERY DAY IN THE EVENING Patient taking differently: Take 40 mg by mouth every evening.  10/04/18   Mar Daring, PA-C  FLUoxetine (PROZAC) 20 MG capsule TAKE 1 CAPSULE BY MOUTH EVERY DAY 06/05/19   Mar Daring, PA-C  furosemide (LASIX) 40 MG tablet TAKE 1 TABLET BY MOUTH EVERY DAY Patient not taking: No sig reported 07/03/18   Mar Daring, PA-C  gabapentin  (NEURONTIN) 300 MG capsule Take 1 capsule (300 mg total) by mouth at bedtime. 05/14/19   Kris Hartmann, NP  HYDROcodone-acetaminophen (NORCO) 5-325 MG tablet Take 1-2 tablets by mouth every 8 (eight) hours as needed for moderate pain or severe pain. 07/18/19   Mar Daring, PA-C  KLOR-CON M20 20 MEQ tablet TAKE 1 TABLET BY MOUTH EVERY DAY Patient not taking: No sig reported 09/12/17   Mar Daring, PA-C  lisinopril (ZESTRIL) 2.5 MG tablet TAKE 1 TABLET BY MOUTH EVERY DAY 06/05/19   Mar Daring, PA-C  metoprolol tartrate (LOPRESSOR) 25 MG tablet TAKE 2 TABLETS BY MOUTH TWICE A DAY Patient taking differently: Take 50 mg by mouth 2 (two) times daily.  08/14/18   Mar Daring, PA-C  montelukast (SINGULAIR) 10 MG tablet Take 1 tablet (10 mg total) by mouth at bedtime. Patient taking differently: Take 10 mg by mouth daily as needed (allergies).  01/23/15   Margarita Rana, MD  omeprazole (PRILOSEC) 40 MG capsule TAKE 1 CAPSULE  BY MOUTH EVERY DAY Patient taking differently: Take 40 mg by mouth daily.  07/03/18   Mar Daring, PA-C  ondansetron (ZOFRAN ODT) 4 MG disintegrating tablet Take 1 tablet (4 mg total) by mouth every 8 (eight) hours as needed. 08/07/19   Lavonia Drafts, MD  ondansetron (ZOFRAN) 4 MG tablet Take 1 tablet (4 mg total) by mouth every 8 (eight) hours as needed for nausea or vomiting. 08/02/19   Mar Daring, PA-C  rivaroxaban (XARELTO) 20 MG TABS tablet Take 20 mg by mouth daily with supper. 08/27/14   [provider]  traMADol (ULTRAM) 50 MG tablet Take 50 mg by mouth every 6 (six) hours as needed for moderate pain.    [provider]     Allergies Codeine, Penicillins, Adhesive [tape], and Sulfa antibiotics  Family History  Problem Relation Age of Onset  . Alzheimer's disease Mother   . Heart attack Father   . Pancreatic cancer Sister   . Healthy Brother     Social History Social History   Tobacco Use  . Smoking  status: Former Smoker    Packs/day: 1.00    Years: 47.00    Pack years: 47.00    Quit date: 08/19/2014    Years since quitting: 4.9  . Smokeless tobacco: Former Systems developer    Quit date: 08/19/2014  Vaping Use  . Vaping Use: Never used  Substance Use Topics  . Alcohol use: No  . Drug use: No    Review of Systems  Constitutional: No fever/chills Eyes: No visual changes.  ENT: No sore throat. Cardiovascular: Denies chest pain. Respiratory: Denies shortness of breath. Gastrointestinal: As above Genitourinary: Negative for dysuria. Musculoskeletal: Negative for back pain. Skin: Negative for rash. Neurological: Negative for headaches   ____________________________________________   PHYSICAL EXAM:  VITAL SIGNS: ED Triage Vitals  Enc Vitals Group     BP 08/07/19 1527 (!) 133/91     Pulse Rate 08/07/19 1527 84     Resp 08/07/19 1527 18     Temp 08/07/19 1527 99.3 F (37.4 C)     Temp Source 08/07/19 1527 Oral     SpO2 08/07/19 1527 98 %     Weight 08/07/19 1528 90.7 kg (200 lb)     Height 08/07/19 1528 1.651 m (5\' 5" )     Head Circumference --      Peak Flow --      Pain Score 08/07/19 1539 6     Pain Loc --      Pain Edu? --      Excl. in [place]? --     Constitutional: Alert and oriented.   Nose: No congestion/rhinnorhea. Mouth/Throat: Mucous membranes are moist.    Cardiovascular: Normal rate, irregular rhythm. Grossly normal heart sounds.  Good peripheral circulation. Respiratory: Normal respiratory effort.  No retractions. Gastrointestinal: Soft and nontender. No distention.  No CVA tenderness.  Reassuring exam  Musculoskeletal: No lower extremity tenderness nor edema.  Warm and well perfused Neurologic:  Normal speech and language. No gross focal neurologic deficits are appreciated.  Skin:  Skin is warm, dry and intact. No rash noted. Psychiatric: Mood and affect are normal. Speech and behavior are normal.  ____________________________________________    LABS (all labs ordered are listed, but only abnormal results are displayed)  Labs Reviewed  LIPASE, BLOOD - Abnormal; Notable for the following components:      Result Value   Lipase 61 (*)    All other components within normal limits  COMPREHENSIVE  METABOLIC PANEL - Abnormal; Notable for the following components:   Glucose, Bld 129 (*)    BUN 26 (*)    Creatinine, Ser 1.31 (*)    Total Bilirubin 1.3 (*)    GFR calc non Af Amer 40 (*)    GFR calc Af Amer 46 (*)    All other components within normal limits  CBC - Abnormal; Notable for the following components:   WBC 13.7 (*)    RBC 6.35 (*)    HCT 49.6 (*)    MCV 78.1 (*)    MCH 23.1 (*)    MCHC 29.6 (*)    RDW 18.2 (*)    All other components within normal limits  URINALYSIS, COMPLETE (UACMP) WITH MICROSCOPIC - Abnormal; Notable for the following components:   Color, Urine AMBER (*)    APPearance CLOUDY (*)    Ketones, ur 5 (*)    Protein, ur 100 (*)    Bacteria, UA MANY (*)    All other components within normal limits  TROPONIN I (HIGH SENSITIVITY) - Abnormal; Notable for the following components:   Troponin I (High Sensitivity) 28 (*)    All other components within normal limits  TROPONIN I (HIGH SENSITIVITY)   ____________________________________________  EKG  ED ECG REPORT I, Lavonia Drafts, the attending physician, personally viewed and interpreted this ECG.  Date: 08/07/2019  Rhythm: Atrial fibrillation QRS Axis: normal Intervals: Abnormal ST/T Wave abnormalities: normal Narrative Interpretation: no evidence of acute ischemia  ____________________________________________  RADIOLOGY  None ____________________________________________   PROCEDURES  Procedure(s) performed: No  Procedures   Critical Care performed: No ____________________________________________   INITIAL IMPRESSION / ASSESSMENT AND PLAN / ED COURSE  Pertinent labs & imaging results that were available during my care of the  patient were reviewed by me and considered in my medical decision making (see chart for details).  Patient presents with complaints of fatigue and weakness in the setting of nausea vomiting diarrhea.  Suspect viral gastroenteritis causing dehydration.  Lab work demonstrates mild elevation of white blood cell count which be consistent with a gastroenteritis, also elevation of BUN to creatinine ratio consistent with dehydration.  Will give IV fluids, IV Zofran  Patient has no chest pain, has a long history of atrial fibrillation.  Mild elevation of troponin normal certainly related to her atrial fibrillation has no cardiac symptoms.  Mild elevation of lipase however patient has no tenderness palpation of her epigastrium.  After IV fluids patient ambulated well and feels much better.  Appropriate for discharge at this time, return precautions discussed.    ____________________________________________   FINAL CLINICAL IMPRESSION(S) / ED DIAGNOSES  Final diagnoses:  Dehydration  Gastroenteritis        Note:  This document was prepared using Dragon voice recognition software and may include unintentional dictation errors.   Lavonia Drafts, MD 08/07/19 [phone]

## 2019-08-07 NOTE — Telephone Encounter (Signed)
Copied from [place] [id number]. Topic: General - Other >> Aug 07, 2019 10:39 AM Leward Quan A wrote: Reason for CRM: Patient called to inform Fenton Malling that she is [place] not feeling very well states that the vomiting she called about last week finally subsided a little and she only had 2 bouts of diarrhea this morning. Per patient she feel terrible and would like a call back from [facility] with some instructions on what to do. Ph# [phone]

## 2019-08-07 NOTE — Telephone Encounter (Signed)
Kayla Maxwell calling back, Mc[place]e waiting on call from [place].  I informed her Kayla Maxwell is off on Tues and Wed.  Kayla Maxwell tells me pt is so weak so cannot put one foot in front of another. She has been vomiting and diarrhea. I transferred pt to NT

## 2019-08-07 NOTE — ED Triage Notes (Signed)
Pt presents to ED via POV with c/o N/V/D. Pt states PCP called in prescription for nausea medication, was taking OTC medication for diarrhea. Pt states symptoms started last Tuesday. Pt also c/o weakness at this time. In triage pt's EKG shows A-fib RVR.

## 2019-08-09 ENCOUNTER — Ambulatory Visit: Payer: Self-pay

## 2019-08-09 NOTE — Telephone Encounter (Signed)
Per [name] after advising to go to the ED for possible dehydration per provider, she stated that patient is eating well and is drinking fluids. That they are concern about her tremors and what to know what to do with them. Per provider if patient Kayla Maxwell feeling dehydrated she should go to the ER. Per [name] patient states she feels good and and is drinking plenty of fluids. With the tremors per provider tremors could be common after having a bad illness. Hopefully with time the tremors will continue to resolve as well.takes time for the body to feel better and recoup after the long GI bug she had. [name] was advised and patient that if she starts to feel lightheaded or feel dehydrated or falls she needs to go to ER. Patient and sister-in- law verbalized understanding. Patient is scheduled for hospital follow up 08/09 at 3pm

## 2019-08-09 NOTE — Telephone Encounter (Signed)
Patient's sister-in-law Kayla Maxwell called with the patient in the background on speaker. [name] says the patient has tremors and wants to know what to do about them. She says this morning around 0800-0900, the patient was standing in the kitchen writing a check out and her legs buckled, she fell in the floor. The patient says she fell on her buttocks and couldn't get herself up, so she crawled to her chair in the living room and pulled herself up. The patient says she didn't hit her head or any injury. [name] says the only injury is to her knee there's an abrasion from her crawling. The patient is alert and oriented. She says when she assisted the patient up to walk to the bathroom, she was trembling so bad she could feel her body getting weak, so she sat back down. I asked about her intake of fluids and food. She says the patient is eating some mashed potatoes, but her appetite is [place] not where it has been. She says she's been drinking fluids, but not sure if she's drinking enough, since she was in the ED on Tuesday for dehydration. I asked about any other symptoms, she denies any other symptoms. I advised to take her back to the ED due to her weakness as she may [place] be a little dehydrated and they would be able to test for that and give more fluids. She asked if I could run it back the provider to see if that is the best thing to do. I called the office and spoke to [place], [facility] who connected me to [place], Arnold Line. Josie advised to have the patient go to the ED, I asked if she would talk to [place], she agreed. The call was connected successfully.  Reason for Disposition . [1] MODERATE weakness (i.e., interferes with work, school, normal activities) AND [2] new-onset or worsening  Answer Assessment - Initial Assessment Questions 1. MECHANISM: "How did the fall happen?"     Standing and legs buckled under her, did not pass out 2. DOMESTIC VIOLENCE AND ELDER ABUSE SCREENING: "Did you fall because someone  pushed you or tried to hurt you?" If Yes, ask: "Are you safe now?"     No 3. ONSET: "When did the fall happen?" (e.g., minutes, hours, or days ago)     This morning 8-9 am 4. LOCATION: "What part of the body hit the ground?" (e.g., back, buttocks, head, hips, knees, hands, head, stomach)     Buttocks 5. INJURY: "Did you hurt (injure) yourself when you fell?" If Yes, ask: "What did you injure? Tell me more about this?" (e.g., body area; type of injury; pain severity)"     No 6. PAIN: "Is there any pain?" If Yes, ask: "How bad is the pain?" (e.g., Scale 1-10; or mild,  moderate, severe)   - NONE (0): no pain   - MILD (1-3): doesn't interfere with normal activities    - MODERATE (4-7): interferes with normal activities or awakens from sleep    - SEVERE (8-10): excruciating pain, unable to do any normal activities      No 7. SIZE: For cuts, bruises, or swelling, ask: "How large is it?" (e.g., inches or centimeters)      Abrasion to the knee when crawling to get to the chair 8. PREGNANCY: "Is there any chance you are pregnant?" "When was your last menstrual period?"     No 9. OTHER SYMPTOMS: "Do you have any other symptoms?" (e.g., dizziness, fever, weakness; new onset or  worsening).       Weakness, tremors 10. CAUSE: "What do you think caused the fall (or falling)?" (e.g., tripped, dizzy spell)       I don't know  Protocols used: FALLS AND FALLING-A-AH

## 2019-08-13 ENCOUNTER — Ambulatory Visit (INDEPENDENT_AMBULATORY_CARE_PROVIDER_SITE_OTHER): Payer: Medicare Other | Admitting: Physician Assistant

## 2019-08-13 ENCOUNTER — Encounter: Payer: Self-pay | Admitting: Physician Assistant

## 2019-08-13 ENCOUNTER — Other Ambulatory Visit: Payer: Self-pay

## 2019-08-13 VITALS — BP 116/80 | HR 92 | Temp 99.0°F | Resp 16 | Wt 193.6 lb

## 2019-08-13 DIAGNOSIS — K529 Noninfective gastroenteritis and colitis, unspecified: Secondary | ICD-10-CM | POA: Diagnosis not present

## 2019-08-13 DIAGNOSIS — D72829 Elevated white blood cell count, unspecified: Secondary | ICD-10-CM | POA: Diagnosis not present

## 2019-08-13 DIAGNOSIS — R7989 Other specified abnormal findings of blood chemistry: Secondary | ICD-10-CM | POA: Diagnosis not present

## 2019-08-13 DIAGNOSIS — K5792 Diverticulitis of intestine, part unspecified, without perforation or abscess without bleeding: Secondary | ICD-10-CM

## 2019-08-13 DIAGNOSIS — I6523 Occlusion and stenosis of bilateral carotid arteries: Secondary | ICD-10-CM | POA: Diagnosis not present

## 2019-08-13 MED ORDER — METRONIDAZOLE 500 MG PO TABS: 500.0000 mg | ORAL_TABLET | Freq: Two times a day (BID) | ORAL | 0 refills | Status: DC

## 2019-08-13 MED ORDER — CIPROFLOXACIN HCL 250 MG PO TABS: 250.0000 mg | ORAL_TABLET | Freq: Two times a day (BID) | ORAL | 0 refills | Status: DC

## 2019-08-13 NOTE — Patient Instructions (Signed)

## 2019-08-13 NOTE — Progress Notes (Signed)
Established patient visit   Patient: Kayla Maxwell   DOB: 21-Nov-1945   74 y.o. Female  MRN: [id number] Visit Date: 08/13/2019  Today's healthcare provider: Mar Daring, PA-C   Chief Complaint  Patient presents with  . Follow-up   Subjective    HPI  Follow up ER visit  Patient was seen in ER for nausea and diarrhea on 08/07/2019. She was treated for dehydration and gastroenteritis. Treatment for this included IV treatment and prescribing Zofran. She reports excellent compliance with treatment. She reports this condition is Improved. [name] having some mild abdominal pain and diarrhea.  -----------------------------------------------------------------------------------------     Patient Active Problem List   Diagnosis Date Noted  . Atheroembolism of foot, left ([place]) 01/11/2019  . Venous insufficiency of both lower extremities 11/02/2017  . Pain and swelling of lower leg 06/20/2017  . Benign essential HTN 11/01/2016  . SOBOE (shortness of breath on exertion) 11/01/2016  . Gastroenteritis 08/16/2016  . Bilateral carotid artery stenosis 03/12/2016  . Fine tremor 03/12/2016  . PAD (peripheral artery disease) ([place]) 01/14/2016  . Atherosclerosis of native arteries of extremity with rest pain ([place]) 11/10/2015  . Cough 06/09/2015  . Abnormal blood sugar 04/11/2015  . Anxiety 11/19/2014  . PTSD (post-traumatic stress disorder) 10/29/2014  . Allergic rhinitis 09/05/2014  . HLD (hyperlipidemia) 09/05/2014  . Mild memory disturbance 09/05/2014  . H/O stroke without residual deficits 09/05/2014  . Edema 09/05/2014  . Spinal stenosis, lumbar 07/04/2014  . MI (mitral incompetence) 05/08/2014  . TI (tricuspid incompetence) 05/08/2014  . Chronic atrial fibrillation ([place]) 05/08/2014  . History of tobacco abuse 04/29/2014  . Gastro-esophageal reflux disease without esophagitis 04/29/2014  . Obstructive apnea 04/29/2014  . Atrial fibrillation by electrocardiography ([place])  04/29/2014  . Cigarette nicotine dependence with nicotine-induced disorder 04/29/2014  . Neuritis or radiculitis due to rupture of lumbar intervertebral disc 09/21/2013  . DDD (degenerative disc disease), lumbar 08/24/2013  . Bursitis, trochanteric 06/18/2013  . Nerve root inflammation 06/18/2013  . Arthralgia of hip 06/04/2013  . Calculus of gallbladder with other cholecystitis, without mention of obstruction 04/27/2012   Past Medical History:  Diagnosis Date  . Acid reflux    takes Nexium daily  . Allergy    takes Singulair daily as needed  . Anemia   . Atrial fibrillation ([place])   . Cancer ([place]) AGE 31   CERVICAL cancer with removal  . Chronic back pain    stenosis  . Complication of anesthesia   . Constipation    takes Colace daily as needed  . Coronary artery disease   . Difficult intubation   . Dysrhythmia    atrial fibrillation dx 04/2014  . Headache    sinus   . History of blood transfusion   . History of shingles   . Hyperlipidemia    takes Pravastatin daily-started taking this 6 months ago  . Hypertension   . Lumbar surgical wound fluid collection   . Sleep apnea    CPAP  . Stroke ([place])    TIA'S X 2 --- one last yr and one this yr...lost her memory over 4-6 hrs.  . Stroke ([place]) 06-04-15   left with right side weakness  . TIA (transient ischemic attack)    showed up on an MRI but she never knew it  . Urinary frequency    Social History   Tobacco Use  . Smoking status: Former Smoker    Packs/day: 1.00    Years: 47.00  Pack years: 47.00    Quit date: 08/19/2014    Years since quitting: 5.0  . Smokeless tobacco: Former Systems developer    Quit date: 08/19/2014  Vaping Use  . Vaping Use: Never used  Substance Use Topics  . Alcohol use: No  . Drug use: No   Allergies  Allergen Reactions  . Codeine Other (See Comments)    Makes her feel "crazy"  . Penicillins Swelling    Did it involve swelling of the face/tongue/throat, SOB, or low BP? Unknown Did it involve  sudden or severe rash/hives, skin peeling, or any reaction on the inside of your mouth or nose? No Did you need to seek medical attention at a hospital or doctor's office? Unknown When did it last happen?Childhood allergy If all above answers are "NO", may proceed with cephalosporin use.   . Adhesive [Tape] Hives  . Sulfa Antibiotics Nausea Only and Rash       Medications: Outpatient Medications Prior to Visit  Medication Sig  . albuterol (PROVENTIL HFA;VENTOLIN HFA) 108 (90 Base) MCG/ACT inhaler Inhale 2 puffs into the lungs every 6 (six) hours as needed for wheezing or shortness of breath.  Marland Kitchen aspirin EC 81 MG EC tablet Take 1 tablet (81 mg total) by mouth daily.  Marland Kitchen FLUoxetine (PROZAC) 20 MG capsule TAKE 1 CAPSULE BY MOUTH EVERY DAY  . furosemide (LASIX) 40 MG tablet TAKE 1 TABLET BY MOUTH EVERY DAY  . gabapentin (NEURONTIN) 300 MG capsule Take 1 capsule (300 mg total) by mouth at bedtime.  Marland Kitchen HYDROcodone-acetaminophen (NORCO) 5-325 MG tablet Take 1-2 tablets by mouth every 8 (eight) hours as needed for moderate pain or severe pain.  Marland Kitchen lisinopril (ZESTRIL) 2.5 MG tablet TAKE 1 TABLET BY MOUTH EVERY DAY  . metoprolol tartrate (LOPRESSOR) 25 MG tablet TAKE 2 TABLETS BY MOUTH TWICE A DAY (Patient taking differently: Take 50 mg by mouth 2 (two) times daily. )  . montelukast (SINGULAIR) 10 MG tablet Take 1 tablet (10 mg total) by mouth at bedtime. (Patient taking differently: Take 10 mg by mouth daily as needed (allergies). )  . omeprazole (PRILOSEC) 40 MG capsule TAKE 1 CAPSULE BY MOUTH EVERY DAY (Patient taking differently: Take 40 mg by mouth daily. )  . rivaroxaban (XARELTO) 20 MG TABS tablet Take 20 mg by mouth daily with supper.   . [DISCONTINUED] atorvastatin (LIPITOR) 40 MG tablet TAKE 1 TABLET BY MOUTH EVERY DAY IN THE EVENING (Patient taking differently: Take 40 mg by mouth every evening. )  . KLOR-CON M20 20 MEQ tablet TAKE 1 TABLET BY MOUTH EVERY DAY  . ondansetron (ZOFRAN ODT)  4 MG disintegrating tablet Take 1 tablet (4 mg total) by mouth every 8 (eight) hours as needed.  . ondansetron (ZOFRAN) 4 MG tablet Take 1 tablet (4 mg total) by mouth every 8 (eight) hours as needed for nausea or vomiting. (Patient not taking: Reported on 08/24/2019)  . traMADol (ULTRAM) 50 MG tablet Take 50 mg by mouth every 6 (six) hours as needed for moderate pain.    No facility-administered medications prior to visit.    Review of Systems  Constitutional: Negative.   Respiratory: Negative.   Cardiovascular: Negative.   Gastrointestinal: Positive for abdominal pain and diarrhea.  Neurological: Negative.       Objective    BP 116/80   Pulse 92   Temp 99 F (37.2 C) (Oral)   Resp 16   Wt 193 lb 9.6 oz (87.8 kg)   SpO2 99%  BMI 32.22 kg/m    Physical Exam Constitutional:      General: She is not in acute distress.    Appearance: Normal appearance. She is well-developed. She is not diaphoretic.  Cardiovascular:     Rate and Rhythm: Normal rate and regular rhythm.     Heart sounds: Normal heart sounds. No murmur heard.  No friction rub. No gallop.   Pulmonary:     Effort: Pulmonary effort is normal. No respiratory distress.     Breath sounds: Normal breath sounds. No wheezing or rales.  Abdominal:     General: Bowel sounds are normal. There is no distension.     Palpations: Abdomen is soft. There is no mass.     Tenderness: There is abdominal tenderness in the left lower quadrant. There is guarding. There is no rebound.  Skin:    General: Skin is warm and dry.  Neurological:     Mental Status: She is alert and oriented to person, place, and time.     Results for orders placed or performed in visit on 08/13/19  CBC w/Diff/Platelet  Result Value Ref Range   WBC 8.6 3.4 - 10.8 x10E3/uL   RBC 5.34 (H) 3.77 - 5.28 x10E6/uL   Hemoglobin 12.8 11.1 - 15.9 g/dL   Hematocrit 41.7 34.0 - 46.6 %   MCV 78 (L) 79 - 97 fL   MCH 24.0 (L) 26.6 - 33.0 pg   MCHC 30.7 (L) 31 -  35 g/dL   RDW 17.3 (H) 11.7 - 15.4 %   Platelets 299 150 - 450 x10E3/uL   Neutrophils 70 Not Estab. %   Lymphs 22 Not Estab. %   Monocytes 6 Not Estab. %   Eos 1 Not Estab. %   Basos 1 Not Estab. %   Neutrophils Absolute 6.1 1 - 7 x10E3/uL   Lymphocytes Absolute 1.9 0 - 3 x10E3/uL   Monocytes Absolute 0.5 0 - 0 x10E3/uL   EOS (ABSOLUTE) 0.1 0.0 - 0.4 x10E3/uL   Basophils Absolute 0.1 0 - 0 x10E3/uL   Immature Granulocytes 0 Not Estab. %   Immature Grans (Abs) 0.0 0.0 - 0.1 H47Q2/VZ  Basic Metabolic Panel (BMET)  Result Value Ref Range   Glucose 101 (H) 65 - 99 mg/dL   BUN 17 8 - 27 mg/dL   Creatinine, Ser 1.24 (H) 0.57 - 1.00 mg/dL   GFR calc non Af Amer 43 (L) >59 mL/min/1.73   GFR calc Af Amer 49 (L) >59 mL/min/1.73   BUN/Creatinine Ratio 14 12 - 28   Sodium 139 134 - 144 mmol/L   Potassium 4.4 3.5 - 5.2 mmol/L   Chloride 103 96 - 106 mmol/L   CO2 24 20 - 29 mmol/L   Calcium 9.0 8.7 - 10.3 mg/dL    Assessment & Plan     1. Leukocytosis, unspecified type Will check labs as below and f/u pending results. - CBC w/Diff/Platelet  2. Elevated serum creatinine Suspected due to dehydration. Will check labs as below and f/u pending results. - Basic Metabolic Panel (BMET)  3. Gastroenteritis Symptoms suspected while in ER. Nausea has improved but [place] some tenderness and diarrhea.  - CBC w/Diff/Platelet - Basic Metabolic Panel (BMET)  4. Diverticulitis Has LLQ tenderness with guarding and diarrhea. Suspect diverticulitis. Will treat with Cipro and Flagyl. F/U in 2 weeks.    Return in about 1 week (around 08/20/2019), or if symptoms worsen or fail to improve, for stomach pain.      Artist Pais  Dorothy Puffer, PA-C, have reviewed all documentation for this visit. The documentation on 08/26/19 for the exam, diagnosis, procedures, and orders are all accurate and complete.   Rubye Beach  [facility] [phone] (phone) [phone] (fax)  [place]

## 2019-08-14 ENCOUNTER — Telehealth: Payer: Self-pay

## 2019-08-14 LAB — BASIC METABOLIC PANEL
BUN/Creatinine Ratio: 14 (ref 12–28)
BUN: 17 mg/dL (ref 8–27)
CO2: 24 mmol/L (ref 20–29)
Calcium: 9 mg/dL (ref 8.7–10.3)
Chloride: 103 mmol/L (ref 96–106)
Creatinine, Ser: 1.24 mg/dL — ABNORMAL HIGH (ref 0.57–1.00)
GFR calc Af Amer: 49 mL/min/{1.73_m2} — ABNORMAL LOW (ref >59)
GFR calc non Af Amer: 43 mL/min/{1.73_m2} — ABNORMAL LOW (ref >59)
Glucose: 101 mg/dL — ABNORMAL HIGH (ref 65–99)
Potassium: 4.4 mmol/L (ref 3.5–5.2)
Sodium: 139 mmol/L (ref 134–144)

## 2019-08-14 LAB — CBC WITH DIFFERENTIAL/PLATELET
Basophils Absolute: 0.1 10*3/uL (ref 0.0–0.2)
Basos: 1 %
EOS (ABSOLUTE): 0.1 10*3/uL (ref 0.0–0.4)
Eos: 1 %
Hematocrit: 41.7 % (ref 34.0–46.6)
Hemoglobin: 12.8 g/dL (ref 11.1–15.9)
Immature Grans (Abs): 0 10*3/uL (ref 0.0–0.1)
Immature Granulocytes: 0 %
Lymphocytes Absolute: 1.9 10*3/uL (ref 0.7–3.1)
Lymphs: 22 %
MCH: 24 pg — ABNORMAL LOW (ref 26.6–33.0)
MCHC: 30.7 g/dL — ABNORMAL LOW (ref 31.5–35.7)
MCV: 78 fL — ABNORMAL LOW (ref 79–97)
Monocytes Absolute: 0.5 10*3/uL (ref 0.1–0.9)
Monocytes: 6 %
Neutrophils Absolute: 6.1 10*3/uL (ref 1.4–7.0)
Neutrophils: 70 %
Platelets: 299 10*3/uL (ref 150–450)
RBC: 5.34 x10E6/uL — ABNORMAL HIGH (ref 3.77–5.28)
RDW: 17.3 % — ABNORMAL HIGH (ref 11.7–15.4)
WBC: 8.6 10*3/uL (ref 3.4–10.8)

## 2019-08-14 NOTE — Telephone Encounter (Signed)
-----   Message from Mar Daring, PA-C sent at 08/14/2019  9:18 AM EDT ----- Blood count is improving and stabilizing. WBC count is now back to normal range.  Kidney function is improving slowly as well. Continue to push fluids. Sodium, potassium, and calcium are normal.

## 2019-08-14 NOTE — Telephone Encounter (Signed)
Kayla Maxwell (on [place]) advised as directed below.

## 2019-08-22 NOTE — Progress Notes (Signed)
Subjective:   Kayla Maxwell is a 74 y.o. female who presents for Medicare Annual (Subsequent) preventive examination.  I connected with Kayla Maxwell today by telephone and verified that I am speaking with the correct person using two identifiers. Location patient: home Location provider: work Persons participating in the virtual visit: patient, provider.   I discussed the limitations, risks, security and privacy concerns of performing an evaluation and management service by telephone and the availability of in person appointments. I also discussed with the patient that there may be a patient responsible charge related to this service. The patient expressed understanding and verbally consented to this telephonic visit.    Interactive audio and video telecommunications were attempted between this provider and patient, however failed, due to patient having technical difficulties OR patient did not have access to video capability.  We continued and completed visit with audio only.   Review of Systems    N/A  Cardiac Risk Factors include: advanced age (>26men, >2 women);dyslipidemia;hypertension     Objective:    Today's Vitals   08/23/19 1435  PainSc: 5    There is no height or weight on file to calculate BMI.  Advanced Directives 08/23/2019 08/07/2019 02/20/2019 01/23/2019 08/22/2018 03/09/2017 08/16/2016  Does Patient Have a Medical Advance Directive? Yes Yes Yes No Yes Yes Yes  Type of Paramedic of [place];Living will [place];Living will [place];Living will - [place];Living will [place];Living will [place];Living will  Does patient want to make changes to medical advance directive? - No - Patient declined - - - - No - Patient declined  Copy of [place] in Chart? Yes - validated most recent copy scanned in chart (See row information) - - - -  Yes Yes  Would patient like information on creating a medical advance directive? - - - No - Patient declined - - -    Current Medications (verified) Outpatient Encounter Medications as of 08/23/2019  Medication Sig  . albuterol (PROVENTIL HFA;VENTOLIN HFA) 108 (90 Base) MCG/ACT inhaler Inhale 2 puffs into the lungs every 6 (six) hours as needed for wheezing or shortness of breath.  Marland Kitchen aspirin EC 81 MG EC tablet Take 1 tablet (81 mg total) by mouth daily.  Marland Kitchen atorvastatin (LIPITOR) 40 MG tablet TAKE 1 TABLET BY MOUTH EVERY DAY IN THE EVENING (Patient taking differently: Take 40 mg by mouth every evening. )  . ciprofloxacin (CIPRO) 250 MG tablet Take 1 tablet (250 mg total) by mouth 2 (two) times daily.  Marland Kitchen FLUoxetine (PROZAC) 20 MG capsule TAKE 1 CAPSULE BY MOUTH EVERY DAY  . furosemide (LASIX) 40 MG tablet TAKE 1 TABLET BY MOUTH EVERY DAY  . gabapentin (NEURONTIN) 300 MG capsule Take 1 capsule (300 mg total) by mouth at bedtime.  Marland Kitchen HYDROcodone-acetaminophen (NORCO) 5-325 MG tablet Take 1-2 tablets by mouth every 8 (eight) hours as needed for moderate pain or severe pain.  Marland Kitchen lisinopril (ZESTRIL) 2.5 MG tablet TAKE 1 TABLET BY MOUTH EVERY DAY  . metoprolol tartrate (LOPRESSOR) 25 MG tablet TAKE 2 TABLETS BY MOUTH TWICE A DAY (Patient taking differently: Take 50 mg by mouth 2 (two) times daily. )  . metroNIDAZOLE (FLAGYL) 500 MG tablet Take 1 tablet (500 mg total) by mouth 2 (two) times daily.  . montelukast (SINGULAIR) 10 MG tablet Take 1 tablet (10 mg total) by mouth at bedtime. (Patient taking differently: Take 10 mg by  mouth daily as needed (allergies). )  . omeprazole (PRILOSEC) 40 MG capsule TAKE 1 CAPSULE BY MOUTH EVERY DAY (Patient taking differently: Take 40 mg by mouth daily. )  . KLOR-CON M20 20 MEQ tablet TAKE 1 TABLET BY MOUTH EVERY DAY (Patient not taking: No sig reported)  . ondansetron (ZOFRAN ODT) 4 MG disintegrating tablet Take 1 tablet (4 mg total) by mouth every 8 (eight) hours as  needed. (Patient not taking: Reported on 08/13/2019)  . ondansetron (ZOFRAN) 4 MG tablet Take 1 tablet (4 mg total) by mouth every 8 (eight) hours as needed for nausea or vomiting. (Patient not taking: Reported on 08/13/2019)  . rivaroxaban (XARELTO) 20 MG TABS tablet Take 20 mg by mouth daily with supper. (Patient not taking: Reported on 08/23/2019)  . traMADol (ULTRAM) 50 MG tablet Take 50 mg by mouth every 6 (six) hours as needed for moderate pain. (Patient not taking: Reported on 08/13/2019)   No facility-administered encounter medications on file as of 08/23/2019.    Allergies (verified) Codeine, Penicillins, Adhesive [tape], and Sulfa antibiotics   History: Past Medical History:  Diagnosis Date  . Acid reflux    takes Nexium daily  . Allergy    takes Singulair daily as needed  . Anemia   . Atrial fibrillation ([place])   . Cancer ([place]) AGE 22   CERVICAL cancer with removal  . Chronic back pain    stenosis  . Complication of anesthesia   . Constipation    takes Colace daily as needed  . Coronary artery disease   . Difficult intubation   . Dysrhythmia    atrial fibrillation dx 04/2014  . Headache    sinus   . History of blood transfusion   . History of shingles   . Hyperlipidemia    takes Pravastatin daily-started taking this 6 months ago  . Hypertension   . Lumbar surgical wound fluid collection   . Sleep apnea    CPAP  . Stroke ([place])    TIA'S X 2 --- one last yr and one this yr...lost her memory over 4-6 hrs.  . Stroke ([place]) 06-04-15   left with right side weakness  . TIA (transient ischemic attack)    showed up on an MRI but she never knew it  . Urinary frequency    Past Surgical History:  Procedure Laterality Date  . ABDOMINAL HYSTERECTOMY    . APPENDECTOMY  1963  . BACK SURGERY  08/19/2014   Dr. Hal Neer ([place] o[facility])  . BREAST SURGERY  1984   augementation  . [place]  . CHOLECYSTECTOMY  2014   Dr Jamal Collin  . COLONOSCOPY    .  ESOPHAGOGASTRODUODENOSCOPY    . HAMMER TOE SURGERY Bilateral   . LOWER EXTREMITY ANGIOGRAPHY Left 01/23/2019   Procedure: LOWER EXTREMITY ANGIOGRAPHY;  Surgeon: Katha Cabal, MD;  Location: [facility];  Service: Cardiovascular;  Laterality: Left;  . LOWER EXTREMITY ANGIOGRAPHY Left 02/20/2019   Procedure: LOWER EXTREMITY ANGIOGRAPHY;  Surgeon: Katha Cabal, MD;  Location: [facility];  Service: Cardiovascular;  Laterality: Left;  . LUMBAR LAMINECTOMY WITH COFLEX 1 LEVEL Bilateral 07/04/2014   Procedure: Laminectomy and Foraminotomy - bilateral - Lumbar Four-Five with coflex ;  Surgeon: Karie Chimera, MD;  Location: [facility];  Service: Neurosurgery;  Laterality: Bilateral;  . LUMBAR WOUND DEBRIDEMENT N/A 08/19/2014   Procedure: Exploration of Lumbar wound;  Surgeon: Karie Chimera, MD;  Location: [facility];  Service: Neurosurgery;  Laterality: N/A;  . PERIPHERAL VASCULAR CATHETERIZATION Left 08/12/2015   Procedure: Lower Extremity Angiography;  Surgeon: Katha Cabal, MD;  Location: [facility];  Service: Cardiovascular;  Laterality: Left;  . TONSILLECTOMY    . VENTRAL HERNIA REPAIR N/A 05/26/2015   Procedure: HERNIA REPAIR VENTRAL ADULT;  Surgeon: Christene Lye, MD;  Location: [facility];  Service: General;  Laterality: N/A;   Family History  Problem Relation Age of Onset  . Alzheimer's disease Mother   . Heart attack Father   . Pancreatic cancer Sister   . Healthy Brother    Social History   Socioeconomic History  . Marital status: Divorced    Spouse name: Not on file  . Number of children: 1  . Years of education: College  . Highest education level: Some college, no degree  Occupational History  . Occupation: Retired  Tobacco Use  . Smoking status: Former Smoker    Packs/day: 1.00    Years: 47.00    Pack years: 47.00    Quit date: 08/19/2014    Years since quitting: 5.0  . Smokeless tobacco: Former Systems developer    Quit date: 08/19/2014   Vaping Use  . Vaping Use: Never used  Substance and Sexual Activity  . Alcohol use: No  . Drug use: No  . Sexual activity: Not on file  Other Topics Concern  . Not on file  Social History Narrative  . Not on file   Social Determinants of Health   Financial Resource Strain: Low Risk   . Difficulty of Paying Living Expenses: Not hard at all  Food Insecurity: No Food Insecurity  . Worried About Charity fundraiser in the Last Year: Never true  . Ran Out of Food in the Last Year: Never true  Transportation Needs: No Transportation Needs  . Lack of Transportation (Medical): No  . Lack of Transportation (Non-Medical): No  Physical Activity: Inactive  . Days of Exercise per Week: 0 days  . Minutes of Exercise per Session: 0 min  Stress: No Stress Concern Present  . Feeling of Stress : Not at all  Social Connections: Socially Isolated  . Frequency of Communication with Friends and Family: More than three times a week  . Frequency of Social Gatherings with Friends and Family: Three times a week  . Attends Religious Services: Never  . Active Member of Clubs or Organizations: No  . Attends Archivist Meetings: Never  . Marital Status: Divorced    Tobacco Counseling Counseling given: Not Answered   Clinical Intake:  Pre-visit preparation completed: Yes  Pain : 0-10 Pain Score: 5  Pain Type: Chronic pain Pain Location: Foot Pain Orientation: Right, Left Pain Descriptors / Indicators: Aching Pain Frequency: Intermittent Pain Relieving Factors: Taking Hydocodone- Acetaminophen for pain as needed.  Pain Relieving Factors: Taking Hydocodone- Acetaminophen for pain as needed.  Nutritional Risks: None Diabetes: No  How often do you need to have someone help you when you read instructions, pamphlets, or other written materials from your doctor or pharmacy?: 1 - Never  Diabetic? No  Interpreter Needed?: No  Information entered by :: [facility], LPN   Activities  of Daily Living In your present state of health, do you have any difficulty performing the following activities: 08/23/2019 01/23/2019  Hearing? N N  Vision? N N  Difficulty concentrating or making decisions? N N  Walking or climbing stairs? Y Y  Comment Due to back pain. -  Dressing or bathing? N N  Doing errands, shopping? N -  Preparing Food and eating ? N -  Using the Toilet? N -  In the past six months, have you accidently leaked urine? N -  Do you have problems with loss of bowel control? N -  Managing your Medications? N -  Managing your Finances? N -  Housekeeping or managing your Housekeeping? Y -  Comment Daughter assists with house work. -  Some recent data might be hidden    Patient Care Team: Mar Daring, PA-C as PCP - General (Family Medicine) Anell Barr, OD as Consulting Physician (Optometry) Corey Skains, MD as Consulting Physician (Cardiology) Schnier, Dolores Lory, MD as Consulting Physician (Vascular Surgery)  Indicate any recent Medical Services you may have received from other than [facility] providers in the past year (date may be approximate).     Assessment:   This is a routine wellness examination for Aava.  Hearing/Vision screen No exam data present  Dietary issues and exercise activities discussed: Current Exercise Habits: The patient does not participate in regular exercise at present, Exercise limited by: orthopedic condition(s)  Goals    . Have 3 meals a day     Recommend 3 small meals a day with 2 healthy snacks in between.       Depression Screen PHQ 2/9 Scores 08/23/2019 08/22/2018 03/09/2017 06/07/2016  PHQ - 2 Score 0 0 1 1  PHQ- 9 Score - - - 7    Fall Risk Fall Risk  08/23/2019 08/22/2018 08/04/2018 03/09/2017 06/07/2016  Falls in the past year? 1 0 (No Data) No No  Comment - - Emmi Telephone Survey: data to providers prior to load - -  Number falls in past yr: 1 - (No Data) - -  Comment Due to weakness from a stomach virus. -  Emmi Telephone Survey Actual Response =  - -  Injury with Fall? 0 - - - -  Follow up Falls prevention discussed - - - -    Any stairs in or around the home? No  If so, are there any without handrails? No  Home free of loose throw rugs in walkways, pet beds, electrical cords, etc? Yes  Adequate lighting in your home to reduce risk of falls? Yes   ASSISTIVE DEVICES UTILIZED TO PREVENT FALLS:  Life alert? No  Use of a cane, walker or w/c? No  Grab bars in the bathroom? Yes  Shower chair or bench in shower? No  Elevated toilet seat or a handicapped toilet? No   Cognitive Function:     6CIT Screen 08/23/2019 08/22/2018 04/04/2017  What Year? 0 points 0 points 0 points  What month? 0 points 0 points 0 points  What time? 0 points 0 points 0 points  Count back from 20 0 points 0 points 0 points  Months in reverse 0 points 0 points 0 points  Repeat phrase 0 points 2 points 4 points  Total Score 0 2 4    Immunizations Immunization History  Administered Date(s) Administered  . Influenza Split 02/23/2006, 12/23/2010  . Influenza, High Dose Seasonal PF 10/29/2014  . PFIZER SARS-COV-2 Vaccination 03/12/2019, 04/04/2019  . Pneumococcal Polysaccharide-23 12/23/2010  . Td 09/04/2004    TDAP status: Due, Education has been provided regarding the importance of this vaccine. Advised may receive this vaccine at local pharmacy or Health Dept. Aware to provide a copy of the vaccination record if obtained from local pharmacy or Health Dept. Verbalized acceptance and understanding. Flu Vaccine status: Due  fall 2021 Pneumococcal vaccine status: Declined,  Education has been provided regarding the importance of this vaccine but patient Jes[place] declined. Advised may receive this vaccine at local pharmacy or Health Dept. Aware to provide a copy of the vaccination record if obtained from local pharmacy or Health Dept. Verbalized acceptance and understanding.  Covid-19 vaccine status: Completed  vaccines  Qualifies for Shingles Vaccine? Yes   Zostavax completed No   Shingrix Completed?: No.    Education has been provided regarding the importance of this vaccine. Patient has been advised to call insurance company to determine out of pocket expense if they have not yet received this vaccine. Advised may also receive vaccine at local pharmacy or Health Dept. Verbalized acceptance and understanding.  Screening Tests Health Maintenance  Topic Date Due  . PNA vac Low Risk Adult (2 of 2 - PCV13) 12/23/2011  . INFLUENZA VACCINE  08/05/2019  . DEXA SCAN  08/22/2020 (Originally 03/29/2007)  . COLONOSCOPY  08/22/2020 (Originally 03/20/2017)  . TETANUS/TDAP  08/22/2020 (Originally 09/05/2014)  . Hepatitis C Screening  08/22/2020 (Originally 10-Oct-1945)  . MAMMOGRAM  07/04/2021  . COVID-19 Vaccine  Completed    Health Maintenance  Health Maintenance Due  Topic Date Due  . PNA vac Low Risk Adult (2 of 2 - PCV13) 12/23/2011  . INFLUENZA VACCINE  08/05/2019    Colorectal cancer screening: Currently due. Declined colonoscopy referral and cologuard order at this time. Mammogram status: Completed 07/05/19. Repeat every year Bone Density status: Currently due, declined order at this time.  Lung Cancer Screening: (Low Dose CT Chest recommended if Age 95-80 years, 30 pack-year currently smoking OR have quit w/in 15years.) does qualify however declined.  Additional Screening:  Hepatitis C Screening: does qualify however declines a future lab order for this.  Vision Screening: Recommended annual ophthalmology exams for early detection of glaucoma and other disorders of the eye. Is the patient up to date with their annual eye exam?  Yes  Who is the provider or what is the name of the office in which the patient attends annual eye exams? Dr Ellin Mayhew If pt is not established with a provider, would they like to be referred to a provider to establish care? No .   Dental Screening: Recommended annual  dental exams for proper oral hygiene  Community Resource Referral / Chronic Care Management: CRR required this visit?  No   CCM required this visit?  No      Plan:     I have personally reviewed and noted the following in the patient's chart:   . Medical and social history . Use of alcohol, tobacco or illicit drugs  . Current medications and supplements . Functional ability and status . Nutritional status . Physical activity . Advanced directives . List of other physicians . Hospitalizations, surgeries, and ER visits in previous 12 months . Vitals . Screenings to include cognitive, depression, and falls . Referrals and appointments  In addition, I have reviewed and discussed with patient certain preventive protocols, quality metrics, and best practice recommendations. A written personalized care plan for preventive services as well as general preventive health recommendations were provided to patient.     Kerrie Timm [place], [place]   08/13/1749   Nurse Notes: Pt would like to receive the Prevnar 13 vaccine at [facility] in office apt. Pt declined a DEXA referral, colonoscopy referral and a future Hep C lab order.

## 2019-08-23 ENCOUNTER — Ambulatory Visit (INDEPENDENT_AMBULATORY_CARE_PROVIDER_SITE_OTHER): Payer: Medicare Other

## 2019-08-23 ENCOUNTER — Other Ambulatory Visit: Payer: Self-pay

## 2019-08-23 DIAGNOSIS — Z0000 Encounter for general adult medical examination without abnormal findings: Secondary | ICD-10-CM | POA: Diagnosis not present

## 2019-08-23 NOTE — Patient Instructions (Signed)
Kayla Maxwell , Thank you for taking time to come for your Medicare Wellness Visit. I appreciate your ongoing commitment to your health goals. Please review the following plan we discussed and let me know if I can assist you in the future.   Screening recommendations/referrals: Colonoscopy: Currently due, declined order at this time. Mammogram: Up to date, due 07/2020 Bone Density: Currently due, declined order today.  Recommended yearly ophthalmology/optometry visit for glaucoma screening and checkup Recommended yearly dental visit for hygiene and checkup  Vaccinations: Influenza vaccine: Due fall 2021 Pneumococcal vaccine: Prevnar 13 due. Pt to receive at [place] in office apt.  Tdap vaccine: Currently due, declined at this time. Shingles vaccine: Shingrix discussed. Please contact your pharmacy for coverage information.     Advanced directives: Currently on file.  Conditions/risks identified: Obesity- continue to work on eating 3 small meals a day and 2 healthy snacks in between.    Next appointment: 08/24/19 @ 2:40 PM with [place] 65 Years and Older, Female Preventive care refers to lifestyle choices and visits with your health care provider that can promote health and wellness. What does preventive care include?  A yearly physical exam. This is also called an annual well check.  Dental exams once or twice a year.  Routine eye exams. Ask your health care provider how often you should have your eyes checked.  Personal lifestyle choices, including:  Daily care of your teeth and gums.  Regular physical activity.  Eating a healthy diet.  Avoiding tobacco and drug use.  Limiting alcohol use.  Practicing safe sex.  Taking low-dose aspirin every day.  Taking vitamin and mineral supplements as recommended by your health care provider. What happens during an annual well check? The services and screenings done by your health care provider during  your annual well check will depend on your age, overall health, lifestyle risk factors, and family history of disease. Counseling  Your health care provider may ask you questions about your:  Alcohol use.  Tobacco use.  Drug use.  Emotional well-being.  Home and relationship well-being.  Sexual activity.  Eating habits.  History of falls.  Memory and ability to understand (cognition).  Work and work Statistician.  Reproductive health. Screening  You may have the following tests or measurements:  Height, weight, and BMI.  Blood pressure.  Lipid and cholesterol levels. These may be checked every 5 years, or more frequently if you are over 74 years old.  Skin check.  Lung cancer screening. You may have this screening every year starting at age 46 if you have a 30-pack-year history of smoking and currently smoke or have quit within the past 15 years.  Fecal occult blood test (FOBT) of the stool. You may have this test every year starting at age 79.  Flexible sigmoidoscopy or colonoscopy. You may have a sigmoidoscopy every 5 years or a colonoscopy every 10 years starting at age 87.  Hepatitis C blood test.  Hepatitis B blood test.  Sexually transmitted disease (STD) testing.  Diabetes screening. This is done by checking your blood sugar (glucose) after you have not eaten for a while (fasting). You may have this done every 1-3 years.  Bone density scan. This is done to screen for osteoporosis. You may have this done starting at age 55.  Mammogram. This may be done every 1-2 years. Talk to your health care provider about how often you should have regular mammograms. Talk with your health care provider  about your test results, treatment options, and if necessary, the need for more tests. Vaccines  Your health care provider may recommend certain vaccines, such as:  Influenza vaccine. This is recommended every year.  Tetanus, diphtheria, and acellular pertussis (Tdap,  Td) vaccine. You may need a Td booster every 10 years.  Zoster vaccine. You may need this after age 7.  Pneumococcal 13-valent conjugate (PCV13) vaccine. One dose is recommended after age 74.  Pneumococcal polysaccharide (PPSV23) vaccine. One dose is recommended after age 69. Talk to your health care provider about which screenings and vaccines you need and how often you need them. This information is not intended to replace advice given to you by your health care provider. Make sure you discuss any questions you have with your health care provider. Document Released: 01/17/2015 Document Revised: 09/10/2015 Document Reviewed: 10/22/2014 Elsevier Interactive Patient Education  2017 [employer] Prevention in the Home Falls can cause injuries. They can happen to people of all ages. There are many things you can do to make your home safe and to help prevent falls. What can I do on the outside of my home?  Regularly fix the edges of walkways and driveways and fix any cracks.  Remove anything that might make you trip as you walk through a door, such as a raised step or threshold.  Trim any bushes or trees on the path to your home.  Use bright outdoor lighting.  Clear any walking paths of anything that might make someone trip, such as rocks or tools.  Regularly check to see if handrails are loose or broken. Make sure that both sides of any steps have handrails.  Any raised decks and porches should have guardrails on the edges.  Have any leaves, snow, or ice cleared regularly.  Use sand or salt on walking paths during winter.  Clean up any spills in your garage right away. This includes oil or grease spills. What can I do in the bathroom?  Use night lights.  Install grab bars by the toilet and in the tub and shower. Do not use towel bars as grab bars.  Use non-skid mats or decals in the tub or shower.  If you need to sit down in the shower, use a plastic, non-slip  stool.  Keep the floor dry. Clean up any water that spills on the floor as soon as it happens.  Remove soap buildup in the tub or shower regularly.  Attach bath mats securely with double-sided non-slip rug tape.  Do not have throw rugs and other things on the floor that can make you trip. What can I do in the bedroom?  Use night lights.  Make sure that you have a light by your bed that is easy to reach.  Do not use any sheets or blankets that are too big for your bed. They should not hang down onto the floor.  Have a firm chair that has side arms. You can use this for support while you get dressed.  Do not have throw rugs and other things on the floor that can make you trip. What can I do in the kitchen?  Clean up any spills right away.  Avoid walking on wet floors.  Keep items that you use a lot in easy-to-reach places.  If you need to reach something above you, use a strong step stool that has a grab bar.  Keep electrical cords out of the way.  Do not use floor polish or  wax that makes floors slippery. If you must use wax, use non-skid floor wax.  Do not have throw rugs and other things on the floor that can make you trip. What can I do with my stairs?  Do not leave any items on the stairs.  Make sure that there are handrails on both sides of the stairs and use them. Fix handrails that are broken or loose. Make sure that handrails are as long as the stairways.  Check any carpeting to make sure that it is firmly attached to the stairs. Fix any carpet that is loose or worn.  Avoid having throw rugs at the top or bottom of the stairs. If you do have throw rugs, attach them to the floor with carpet tape.  Make sure that you have a light switch at the top of the stairs and the bottom of the stairs. If you do not have them, ask someone to add them for you. What else can I do to help prevent falls?  Wear shoes that:  Do not have high heels.  Have rubber bottoms.  Are  comfortable and fit you well.  Are closed at the toe. Do not wear sandals.  If you use a stepladder:  Make sure that it is fully opened. Do not climb a closed stepladder.  Make sure that both sides of the stepladder are locked into place.  Ask someone to hold it for you, if possible.  Clearly mark and make sure that you can see:  Any grab bars or handrails.  First and last steps.  Where the edge of each step is.  Use tools that help you move around (mobility aids) if they are needed. These include:  Canes.  Walkers.  Scooters.  Crutches.  Turn on the lights when you go into a dark area. Replace any light bulbs as soon as they burn out.  Set up your furniture so you have a clear path. Avoid moving your furniture around.  If any of your floors are uneven, fix them.  If there are any pets around you, be aware of where they are.  Review your medicines with your doctor. Some medicines can make you feel dizzy. This can increase your chance of falling. Ask your doctor what other things that you can do to help prevent falls. This information is not intended to replace advice given to you by your health care provider. Make sure you discuss any questions you have with your health care provider. Document Released: 10/17/2008 Document Revised: 05/29/2015 Document Reviewed: 01/25/2014 Elsevier Interactive Patient Education  2017 [employer].

## 2019-08-24 ENCOUNTER — Ambulatory Visit (INDEPENDENT_AMBULATORY_CARE_PROVIDER_SITE_OTHER): Payer: Medicare Other | Admitting: Physician Assistant

## 2019-08-24 ENCOUNTER — Other Ambulatory Visit: Payer: Self-pay

## 2019-08-24 ENCOUNTER — Encounter: Payer: Self-pay | Admitting: Physician Assistant

## 2019-08-24 VITALS — BP 124/80 | HR 85 | Temp 99.0°F | Resp 16 | Wt 192.2 lb

## 2019-08-24 DIAGNOSIS — Z23 Encounter for immunization: Secondary | ICD-10-CM

## 2019-08-24 DIAGNOSIS — I70222 Atherosclerosis of native arteries of extremities with rest pain, left leg: Secondary | ICD-10-CM | POA: Diagnosis not present

## 2019-08-24 DIAGNOSIS — K5792 Diverticulitis of intestine, part unspecified, without perforation or abscess without bleeding: Secondary | ICD-10-CM | POA: Diagnosis not present

## 2019-08-24 DIAGNOSIS — I739 Peripheral vascular disease, unspecified: Secondary | ICD-10-CM

## 2019-08-24 DIAGNOSIS — I6523 Occlusion and stenosis of bilateral carotid arteries: Secondary | ICD-10-CM

## 2019-08-24 DIAGNOSIS — E782 Mixed hyperlipidemia: Secondary | ICD-10-CM

## 2019-08-24 MED ORDER — LOVASTATIN 40 MG PO TABS: 40.0000 mg | ORAL_TABLET | Freq: Every day | ORAL | 3 refills | Status: DC

## 2019-08-24 NOTE — Progress Notes (Signed)
Established patient visit   Patient: Kayla Maxwell   DOB: 1945-05-25   74 y.o. Female  MRN: [id number] Visit Date: 08/24/2019  Today's healthcare provider: Mar Daring, PA-C   Chief Complaint  Patient presents with  . Follow-up   Subjective    HPI  Had AWV with [facility] 08/23/2019  Follow up for  Diverticulitis   The patient was last seen for this 2 weeks ago. Changes made at last visit include monitoring and continuing to push fluids.  She reports excellent compliance with treatment. She feels that condition is Improved. She is not having side effects.   -----------------------------------------------------------------------------------------    Patient Active Problem List   Diagnosis Date Noted  . Atheroembolism of foot, left ([place]) 01/11/2019  . Venous insufficiency of both lower extremities 11/02/2017  . Pain and swelling of lower leg 06/20/2017  . Benign essential HTN 11/01/2016  . SOBOE (shortness of breath on exertion) 11/01/2016  . Gastroenteritis 08/16/2016  . Bilateral carotid artery stenosis 03/12/2016  . Fine tremor 03/12/2016  . PAD (peripheral artery disease) ([place]) 01/14/2016  . Atherosclerosis of native arteries of extremity with rest pain ([place]) 11/10/2015  . Cough 06/09/2015  . Abnormal blood sugar 04/11/2015  . Anxiety 11/19/2014  . PTSD (post-traumatic stress disorder) 10/29/2014  . Allergic rhinitis 09/05/2014  . HLD (hyperlipidemia) 09/05/2014  . Mild memory disturbance 09/05/2014  . H/O stroke without residual deficits 09/05/2014  . Edema 09/05/2014  . Spinal stenosis, lumbar 07/04/2014  . MI (mitral incompetence) 05/08/2014  . TI (tricuspid incompetence) 05/08/2014  . Chronic atrial fibrillation ([place]) 05/08/2014  . History of tobacco abuse 04/29/2014  . Gastro-esophageal reflux disease without esophagitis 04/29/2014  . Obstructive apnea 04/29/2014  . Atrial fibrillation by electrocardiography ([place]) 04/29/2014  . Cigarette  nicotine dependence with nicotine-induced disorder 04/29/2014  . Neuritis or radiculitis due to rupture of lumbar intervertebral disc 09/21/2013  . DDD (degenerative disc disease), lumbar 08/24/2013  . Bursitis, trochanteric 06/18/2013  . Nerve root inflammation 06/18/2013  . Arthralgia of hip 06/04/2013  . Calculus of gallbladder with other cholecystitis, without mention of obstruction 04/27/2012   Past Medical History:  Diagnosis Date  . Acid reflux    takes Nexium daily  . Allergy    takes Singulair daily as needed  . Anemia   . Atrial fibrillation ([place])   . Cancer ([place]) AGE 17   CERVICAL cancer with removal  . Chronic back pain    stenosis  . Complication of anesthesia   . Constipation    takes Colace daily as needed  . Coronary artery disease   . Difficult intubation   . Dysrhythmia    atrial fibrillation dx 04/2014  . Headache    sinus   . History of blood transfusion   . History of shingles   . Hyperlipidemia    takes Pravastatin daily-started taking this 6 months ago  . Hypertension   . Lumbar surgical wound fluid collection   . Sleep apnea    CPAP  . Stroke ([place])    TIA'S X 2 --- one last yr and one this yr...lost her memory over 4-6 hrs.  . Stroke ([place]) 06-04-15   left with right side weakness  . TIA (transient ischemic attack)    showed up on an MRI but she never knew it  . Urinary frequency    Social History   Tobacco Use  . Smoking status: Former Smoker    Packs/day: 1.00    Years: 47.00  Pack years: 47.00    Quit date: 08/19/2014    Years since quitting: 5.0  . Smokeless tobacco: Former Systems developer    Quit date: 08/19/2014  Vaping Use  . Vaping Use: Never used  Substance Use Topics  . Alcohol use: No  . Drug use: No   Allergies  Allergen Reactions  . Codeine Other (See Comments)    Makes her feel "crazy"  . Penicillins Swelling    Did it involve swelling of the face/tongue/throat, SOB, or low BP? Unknown Did it involve sudden or severe  rash/hives, skin peeling, or any reaction on the inside of your mouth or nose? No Did you need to seek medical attention at a hospital or doctor's office? Unknown When did it last happen?Childhood allergy If all above answers are "NO", may proceed with cephalosporin use.   . Adhesive [Tape] Hives  . Sulfa Antibiotics Nausea Only and Rash       Medications: Outpatient Medications Prior to Visit  Medication Sig  . albuterol (PROVENTIL HFA;VENTOLIN HFA) 108 (90 Base) MCG/ACT inhaler Inhale 2 puffs into the lungs every 6 (six) hours as needed for wheezing or shortness of breath.  Marland Kitchen aspirin EC 81 MG EC tablet Take 1 tablet (81 mg total) by mouth daily.  Marland Kitchen FLUoxetine (PROZAC) 20 MG capsule TAKE 1 CAPSULE BY MOUTH EVERY DAY  . furosemide (LASIX) 40 MG tablet TAKE 1 TABLET BY MOUTH EVERY DAY  . gabapentin (NEURONTIN) 300 MG capsule Take 1 capsule (300 mg total) by mouth at bedtime.  Marland Kitchen HYDROcodone-acetaminophen (NORCO) 5-325 MG tablet Take 1-2 tablets by mouth every 8 (eight) hours as needed for moderate pain or severe pain.  Marland Kitchen KLOR-CON M20 20 MEQ tablet TAKE 1 TABLET BY MOUTH EVERY DAY  . lisinopril (ZESTRIL) 2.5 MG tablet TAKE 1 TABLET BY MOUTH EVERY DAY  . metoprolol tartrate (LOPRESSOR) 25 MG tablet TAKE 2 TABLETS BY MOUTH TWICE A DAY (Patient taking differently: Take 50 mg by mouth 2 (two) times daily. )  . montelukast (SINGULAIR) 10 MG tablet Take 1 tablet (10 mg total) by mouth at bedtime. (Patient taking differently: Take 10 mg by mouth daily as needed (allergies). )  . omeprazole (PRILOSEC) 40 MG capsule TAKE 1 CAPSULE BY MOUTH EVERY DAY (Patient taking differently: Take 40 mg by mouth daily. )  . ondansetron (ZOFRAN ODT) 4 MG disintegrating tablet Take 1 tablet (4 mg total) by mouth every 8 (eight) hours as needed.  . rivaroxaban (XARELTO) 20 MG TABS tablet Take 20 mg by mouth daily with supper.   . traMADol (ULTRAM) 50 MG tablet Take 50 mg by mouth every 6 (six) hours as needed for  moderate pain.   . [DISCONTINUED] atorvastatin (LIPITOR) 40 MG tablet TAKE 1 TABLET BY MOUTH EVERY DAY IN THE EVENING (Patient taking differently: Take 40 mg by mouth every evening. )  . [DISCONTINUED] ciprofloxacin (CIPRO) 250 MG tablet Take 1 tablet (250 mg total) by mouth 2 (two) times daily.  . [DISCONTINUED] metroNIDAZOLE (FLAGYL) 500 MG tablet Take 1 tablet (500 mg total) by mouth 2 (two) times daily.  . ondansetron (ZOFRAN) 4 MG tablet Take 1 tablet (4 mg total) by mouth every 8 (eight) hours as needed for nausea or vomiting. (Patient not taking: Reported on 08/24/2019)   No facility-administered medications prior to visit.    Review of Systems  Constitutional: Negative.   Respiratory: Negative.   Cardiovascular: Negative.   Gastrointestinal: Positive for diarrhea (some, but better). Negative for abdominal distention, abdominal pain,  constipation, nausea and vomiting.  Genitourinary: Negative.   Neurological: Negative.     Last CBC Lab Results  Component Value Date   WBC 8.6 08/13/2019   HGB 12.8 08/13/2019   HCT 41.7 08/13/2019   MCV 78 (L) 08/13/2019   MCH 24.0 (L) 08/13/2019   RDW 17.3 (H) 08/13/2019   PLT 299 08/13/2019      Objective    BP 124/80   Pulse 85   Temp 99 F (37.2 C) (Oral)   Resp 16   Wt 192 lb 3.2 oz (87.2 kg)   SpO2 97%   BMI 31.98 kg/m    Physical Exam Vitals reviewed.  Constitutional:      General: She is not in acute distress.    Appearance: Normal appearance. She is well-developed. She is not diaphoretic.  Cardiovascular:     Rate and Rhythm: Normal rate and regular rhythm.     Pulses: Normal pulses.     Heart sounds: Normal heart sounds. No murmur heard.  No friction rub. No gallop.   Pulmonary:     Effort: Pulmonary effort is normal. No respiratory distress.     Breath sounds: Normal breath sounds. No wheezing or rales.  Abdominal:     General: Abdomen is flat. Bowel sounds are normal. There is no distension.     Palpations:  Abdomen is soft. There is no mass.     Tenderness: There is no abdominal tenderness. There is no guarding or rebound.  Skin:    General: Skin is warm and dry.  Neurological:     Mental Status: She is alert and oriented to person, place, and time.      No results found for any visits on 08/24/19.  Assessment & Plan     1. Diverticulitis Improved with antibiotic treatment. No more LLQ tenderness. Continue to push fluids.  2. PAD (peripheral artery disease) (HCC) Stable. Diagnosis pulled for medication refill. Continue current medical treatment plan. - lovastatin (MEVACOR) 40 MG tablet; Take 1 tablet (40 mg total) by mouth at bedtime.  Dispense: 90 tablet; Refill: 3  3. Atherosclerosis of native artery of left lower extremity with rest pain (HCC) Stable. Diagnosis pulled for medication refill. Continue current medical treatment plan. - lovastatin (MEVACOR) 40 MG tablet; Take 1 tablet (40 mg total) by mouth at bedtime.  Dispense: 90 tablet; Refill: 3  4. Mixed hyperlipidemia Stable. Diagnosis pulled for medication refill. Continue current medical treatment plan. - lovastatin (MEVACOR) 40 MG tablet; Take 1 tablet (40 mg total) by mouth at bedtime.  Dispense: 90 tablet; Refill: 3  5. Need for pneumococcal vaccination Prevnar 13 Vaccine given to patient without complications. Patient sat for 15 minutes after administration and was tolerated well without adverse effects. - Pneumococcal conjugate vaccine 13-valent   Return if symptoms worsen or fail to improve.      Reynolds Bowl, PA-C, have reviewed all documentation for this visit. The documentation on 08/29/19 for the exam, diagnosis, procedures, and orders are all accurate and complete.   [place]  [facility] [phone] (phone) [phone] (fax)  [place]

## 2019-08-24 NOTE — Patient Instructions (Signed)
Pneumococcal Conjugate Vaccine suspension for injection What is this medicine? PNEUMOCOCCAL VACCINE (NEU mo KOK al vak SEEN) is a vaccine used to prevent pneumococcus bacterial infections. These bacteria can cause serious infections like pneumonia, meningitis, and blood infections. This vaccine will lower your chance of getting pneumonia. If you do get pneumonia, it can make your symptoms milder and your illness shorter. This vaccine will not treat an infection and will not cause infection. This vaccine is recommended for infants and young children, adults with certain medical conditions, and adults 65 years or older. This medicine may be used for other purposes; ask your health care provider or pharmacist if you have questions. COMMON BRAND NAME(S): Prevnar, Prevnar 13 What should I tell my health care provider before I take this medicine? They need to know if you have any of these conditions:  bleeding problems  fever  immune system problems  an unusual or allergic reaction to pneumococcal vaccine, diphtheria toxoid, other vaccines, latex, other medicines, foods, dyes, or preservatives  pregnant or trying to get pregnant  breast-feeding How should I use this medicine? This vaccine is for injection into a muscle. It is given by a health care professional. A copy of Vaccine Information Statements will be given before each vaccination. Read this sheet carefully each time. The sheet may change frequently. Talk to your pediatrician regarding the use of this medicine in children. While this drug may be prescribed for children as young as 6 weeks old for selected conditions, precautions do apply. Overdosage: If you think you have taken too much of this medicine contact a poison control center or emergency room at once. NOTE: This medicine is only for you. Do not share this medicine with others. What if I miss a dose? It is important not to miss your dose. Call your doctor or health care  professional if you are unable to keep an appointment. What may interact with this medicine?  medicines for cancer chemotherapy  medicines that suppress your immune function  steroid medicines like prednisone or cortisone This list may not describe all possible interactions. Give your health care provider a list of all the medicines, herbs, non-prescription drugs, or dietary supplements you use. Also tell them if you smoke, drink alcohol, or use illegal drugs. Some items may interact with your medicine. What should I watch for while using this medicine? Mild fever and pain should go away in 3 days or less. Report any unusual symptoms to your doctor or health care professional. What side effects may I notice from receiving this medicine? Side effects that you should report to your doctor or health care professional as soon as possible:  allergic reactions like skin rash, itching or hives, swelling of the face, lips, or tongue  breathing problems  confused  fast or irregular heartbeat  fever over 102 degrees F  seizures  unusual bleeding or bruising  unusual muscle weakness Side effects that usually do not require medical attention (report to your doctor or health care professional if they continue or are bothersome):  aches and pains  diarrhea  fever of 102 degrees F or less  headache  irritable  loss of appetite  pain, tender at site where injected  trouble sleeping This list may not describe all possible side effects. Call your doctor for medical advice about side effects. You may report side effects to FDA at 1-800-FDA-1088. Where should I keep my medicine? This does not apply. This vaccine is given in a clinic, pharmacy, doctor's office,   or other health care setting and will not be stored at home. NOTE: This sheet is a summary. It may not cover all possible information. If you have questions about this medicine, talk to your doctor, pharmacist, or health care  provider.  2020 Elsevier/Gold Standard (2013-09-27 10:27:27)  

## 2019-08-29 ENCOUNTER — Encounter: Payer: Self-pay | Admitting: Physician Assistant

## 2019-09-02 ENCOUNTER — Other Ambulatory Visit: Payer: Self-pay | Admitting: Physician Assistant

## 2019-09-02 DIAGNOSIS — K219 Gastro-esophageal reflux disease without esophagitis: Secondary | ICD-10-CM

## 2019-09-20 ENCOUNTER — Other Ambulatory Visit: Payer: Self-pay | Admitting: Physician Assistant

## 2019-09-20 DIAGNOSIS — I70222 Atherosclerosis of native arteries of extremities with rest pain, left leg: Secondary | ICD-10-CM

## 2019-09-20 NOTE — Telephone Encounter (Signed)
Requested medication (s) are due for refill today: yes  Requested medication (s) are on the active medication list: yes   Last refill:  07/18/2019  Future visit scheduled: yes   Notes to clinic: this refill cannot be delegated    Requested Prescriptions  Pending Prescriptions Disp Refills   HYDROcodone-acetaminophen (NORCO) 5-325 MG tablet 90 tablet 0    Sig: Take 1-2 tablets by mouth every 8 (eight) hours as needed for moderate pain or severe pain.      Not Delegated - Analgesics:  Opioid Agonist Combinations Failed - 09/20/2019  9:10 AM      Failed - This refill cannot be delegated      Failed - Urine Drug Screen completed in last 360 days.      Passed - Valid encounter within last 6 months    Recent Outpatient Visits           3 weeks ago Diverticulitis   [place], [place]   1 month ago Leukocytosis, unspecified type   [place], [place]   4 months ago Benign essential HTN   [place], [place], [place]   2 years ago Atherosclerotic peripheral vascular disease with intermittent claudication [facility])   [facility] [place], [place], [place]   3 years ago Salmonella gastroenteritis   [facility] [place], [place], [place]

## 2019-09-20 NOTE — Telephone Encounter (Signed)
HYDROcodone-acetaminophen (NORCO) 5-325 MG tablet Medication Date: 07/18/2019 Department: [place] Ordering/Authorizing: Mar Daring, PA-C   [id number] [address], [place] [address] Phone:  [phone]  Fax:  [phone]

## 2019-09-21 MED ORDER — HYDROCODONE-ACETAMINOPHEN 5-325 MG PO TABS: 1.0000 | ORAL_TABLET | Freq: Three times a day (TID) | ORAL | 0 refills | Status: DC | PRN

## 2019-10-03 DIAGNOSIS — R079 Chest pain, unspecified: Secondary | ICD-10-CM | POA: Diagnosis not present

## 2019-10-03 DIAGNOSIS — I340 Nonrheumatic mitral (valve) insufficiency: Secondary | ICD-10-CM | POA: Diagnosis not present

## 2019-10-03 DIAGNOSIS — I071 Rheumatic tricuspid insufficiency: Secondary | ICD-10-CM | POA: Diagnosis not present

## 2019-10-03 DIAGNOSIS — R0602 Shortness of breath: Secondary | ICD-10-CM | POA: Diagnosis not present

## 2019-10-08 DIAGNOSIS — G4733 Obstructive sleep apnea (adult) (pediatric): Secondary | ICD-10-CM | POA: Diagnosis not present

## 2019-10-08 DIAGNOSIS — I482 Chronic atrial fibrillation, unspecified: Secondary | ICD-10-CM | POA: Diagnosis not present

## 2019-10-08 DIAGNOSIS — I071 Rheumatic tricuspid insufficiency: Secondary | ICD-10-CM | POA: Diagnosis not present

## 2019-10-08 DIAGNOSIS — I340 Nonrheumatic mitral (valve) insufficiency: Secondary | ICD-10-CM | POA: Diagnosis not present

## 2019-10-08 DIAGNOSIS — I872 Venous insufficiency (chronic) (peripheral): Secondary | ICD-10-CM | POA: Diagnosis not present

## 2019-10-08 DIAGNOSIS — I6523 Occlusion and stenosis of bilateral carotid arteries: Secondary | ICD-10-CM | POA: Diagnosis not present

## 2019-10-08 DIAGNOSIS — E782 Mixed hyperlipidemia: Secondary | ICD-10-CM | POA: Diagnosis not present

## 2019-10-08 DIAGNOSIS — I10 Essential (primary) hypertension: Secondary | ICD-10-CM | POA: Diagnosis not present

## 2019-10-08 DIAGNOSIS — I70219 Atherosclerosis of native arteries of extremities with intermittent claudication, unspecified extremity: Secondary | ICD-10-CM | POA: Diagnosis not present

## 2019-10-08 DIAGNOSIS — R0789 Other chest pain: Secondary | ICD-10-CM | POA: Diagnosis not present

## 2019-10-30 DIAGNOSIS — H25013 Cortical age-related cataract, bilateral: Secondary | ICD-10-CM | POA: Diagnosis not present

## 2019-10-30 DIAGNOSIS — H35033 Hypertensive retinopathy, bilateral: Secondary | ICD-10-CM | POA: Diagnosis not present

## 2019-10-31 ENCOUNTER — Other Ambulatory Visit: Payer: Self-pay | Admitting: Physician Assistant

## 2019-10-31 DIAGNOSIS — I70222 Atherosclerosis of native arteries of extremities with rest pain, left leg: Secondary | ICD-10-CM

## 2019-10-31 NOTE — Telephone Encounter (Signed)
Requested medication (s) are due for refill today: Yes  Requested medication (s) are on the active medication list: Yes  Last refill:  09/21/19  Future visit scheduled: No  Notes to clinic:  See request.    Requested Prescriptions  Pending Prescriptions Disp Refills   HYDROcodone-acetaminophen (NORCO) 5-325 MG tablet 90 tablet 0    Sig: Take 1-2 tablets by mouth every 8 (eight) hours as needed for moderate pain or severe pain.      Not Delegated - Analgesics:  Opioid Agonist Combinations Failed - 10/31/2019 12:53 PM      Failed - This refill cannot be delegated      Failed - Urine Drug Screen completed in last 360 days.      Passed - Valid encounter within last 6 months    Recent Outpatient Visits           2 months ago Diverticulitis   [place], [place]   2 months ago Leukocytosis, unspecified type   [place], [place]   5 months ago Benign essential HTN   [place], [place], [place]   2 years ago Atherosclerotic peripheral vascular disease with intermittent claudication [facility])   [facility] [place], [place], [place]   3 years ago Salmonella gastroenteritis   [facility] [place], [place], [place]

## 2019-10-31 NOTE — Telephone Encounter (Signed)
Pt request refill  HYDROcodone-acetaminophen (NORCO) 5-325 MG tablet  [id number] [place], [place] - [address] Phone:  [phone]  Fax:  [phone]

## 2019-11-01 MED ORDER — HYDROCODONE-ACETAMINOPHEN 5-325 MG PO TABS: 1.0000 | ORAL_TABLET | Freq: Three times a day (TID) | ORAL | 0 refills | Status: DC | PRN

## 2019-11-19 DIAGNOSIS — H35033 Hypertensive retinopathy, bilateral: Secondary | ICD-10-CM | POA: Diagnosis not present

## 2019-11-19 DIAGNOSIS — H2589 Other age-related cataract: Secondary | ICD-10-CM | POA: Diagnosis not present

## 2019-11-19 DIAGNOSIS — H2512 Age-related nuclear cataract, left eye: Secondary | ICD-10-CM | POA: Diagnosis not present

## 2019-11-19 DIAGNOSIS — H2513 Age-related nuclear cataract, bilateral: Secondary | ICD-10-CM | POA: Diagnosis not present

## 2019-11-19 DIAGNOSIS — H25013 Cortical age-related cataract, bilateral: Secondary | ICD-10-CM | POA: Diagnosis not present

## 2019-11-19 DIAGNOSIS — H25042 Posterior subcapsular polar age-related cataract, left eye: Secondary | ICD-10-CM | POA: Diagnosis not present

## 2019-11-28 ENCOUNTER — Other Ambulatory Visit: Payer: Self-pay | Admitting: Physician Assistant

## 2019-11-28 DIAGNOSIS — F3341 Major depressive disorder, recurrent, in partial remission: Secondary | ICD-10-CM

## 2019-11-28 DIAGNOSIS — Z8679 Personal history of other diseases of the circulatory system: Secondary | ICD-10-CM

## 2019-12-17 ENCOUNTER — Other Ambulatory Visit: Payer: Self-pay | Admitting: Physician Assistant

## 2019-12-17 DIAGNOSIS — I70222 Atherosclerosis of native arteries of extremities with rest pain, left leg: Secondary | ICD-10-CM

## 2019-12-17 NOTE — Telephone Encounter (Signed)
Requested medication (s) are due for refill today: Yes  Requested medication (s) are on the active medication list: Yes  Last refill:  11/01/19  Future visit scheduled: No  Notes to clinic:  Unable to refill per protocol, cannot delegate     Requested Prescriptions  Pending Prescriptions Disp Refills   HYDROcodone-acetaminophen (NORCO) 5-325 MG tablet 90 tablet 0    Sig: Take 1-2 tablets by mouth every 8 (eight) hours as needed for moderate pain or severe pain.      Not Delegated - Analgesics:  Opioid Agonist Combinations Failed - 12/17/2019  4:33 PM      Failed - This refill cannot be delegated      Failed - Urine Drug Screen completed in last 360 days      Passed - Valid encounter within last 6 months    Recent Outpatient Visits           3 months ago Diverticulitis   [place], [facility]   4 months ago Leukocytosis, unspecified type   [place], [place]   7 months ago Benign essential HTN   [place], [place], [place]   2 years ago Atherosclerotic peripheral vascular disease with intermittent claudication [facility])   [facility] [place], [place], [place]   3 years ago Salmonella gastroenteritis   [facility] [place], [place], [place]

## 2019-12-17 NOTE — Telephone Encounter (Signed)
Patient requesting HYDROcodone-acetaminophen (NORCO) 5-325 MG tablet , informed please allow 48 to 72 hour turn around time   [id number] [place], [place] - [address] Phone:  [phone]  Fax:  [phone]

## 2019-12-18 DIAGNOSIS — H25812 Combined forms of age-related cataract, left eye: Secondary | ICD-10-CM | POA: Diagnosis not present

## 2019-12-18 DIAGNOSIS — H2512 Age-related nuclear cataract, left eye: Secondary | ICD-10-CM | POA: Diagnosis not present

## 2019-12-18 MED ORDER — HYDROCODONE-ACETAMINOPHEN 5-325 MG PO TABS: 1.0000 | ORAL_TABLET | Freq: Three times a day (TID) | ORAL | 0 refills | Status: DC | PRN

## 2019-12-24 DIAGNOSIS — Z23 Encounter for immunization: Secondary | ICD-10-CM | POA: Diagnosis not present

## 2020-01-09 DIAGNOSIS — H2589 Other age-related cataract: Secondary | ICD-10-CM | POA: Diagnosis not present

## 2020-01-09 DIAGNOSIS — H25011 Cortical age-related cataract, right eye: Secondary | ICD-10-CM | POA: Diagnosis not present

## 2020-01-09 DIAGNOSIS — H2511 Age-related nuclear cataract, right eye: Secondary | ICD-10-CM | POA: Diagnosis not present

## 2020-01-15 DIAGNOSIS — H2511 Age-related nuclear cataract, right eye: Secondary | ICD-10-CM | POA: Diagnosis not present

## 2020-01-15 DIAGNOSIS — H25811 Combined forms of age-related cataract, right eye: Secondary | ICD-10-CM | POA: Diagnosis not present

## 2020-01-29 ENCOUNTER — Other Ambulatory Visit (INDEPENDENT_AMBULATORY_CARE_PROVIDER_SITE_OTHER): Payer: Self-pay | Admitting: Nurse Practitioner

## 2020-01-29 ENCOUNTER — Other Ambulatory Visit: Payer: Self-pay | Admitting: Physician Assistant

## 2020-01-29 DIAGNOSIS — I70222 Atherosclerosis of native arteries of extremities with rest pain, left leg: Secondary | ICD-10-CM

## 2020-01-29 MED ORDER — HYDROCODONE-ACETAMINOPHEN 5-325 MG PO TABS: 1.0000 | ORAL_TABLET | Freq: Three times a day (TID) | ORAL | 0 refills | Status: DC | PRN

## 2020-01-29 NOTE — Telephone Encounter (Signed)
Requested medications are due for refill today.  Yes  Requested medications are on the active medications list.  yes  Last refill. 12/18/2019  Future visit scheduled.  08/28/2020  Notes to clinic.  Not delegated

## 2020-01-29 NOTE — Telephone Encounter (Signed)
Medication:  HYDROcodone-acetaminophen (NORCO) 5-325 MG tablet [[id number]]   Has the patient contacted their pharmacy? Yes   (Agent: If yes, when and what did the pharmacy advise?) to call the office   Preferred Pharmacy (with phone number or street name):  [id number] [place], [place] [address]  [address] [place] [address]  Phone:  [phone] Fax:  [phone]  Agent: Please be advised that RX refills may take up to 3 business days. We ask that you follow-up with your pharmacy.

## 2020-01-29 NOTE — Telephone Encounter (Signed)
Is this ok to refill  the pt was supposed to be referred to a neurologists.

## 2020-02-12 ENCOUNTER — Emergency Department: Payer: Medicare Other

## 2020-02-12 ENCOUNTER — Inpatient Hospital Stay
Admission: EM | Admit: 2020-02-12 | Discharge: 2020-02-15 | DRG: 247 | Disposition: A | Discharge status: Home / Self Care | Payer: Medicare Other | Attending: Internal Medicine | Admitting: Internal Medicine

## 2020-02-12 ENCOUNTER — Other Ambulatory Visit: Payer: Self-pay

## 2020-02-12 ENCOUNTER — Inpatient Hospital Stay
Admit: 2020-02-12 | Discharge: 2020-02-12 | Disposition: A | Discharge status: Home / Self Care | Payer: Medicare Other | Attending: Internal Medicine | Admitting: Internal Medicine

## 2020-02-12 ENCOUNTER — Encounter
Admission: EM | Disposition: A | Discharge status: Home / Self Care | Payer: Self-pay | Source: Home / Self Care | Attending: Internal Medicine

## 2020-02-12 DIAGNOSIS — Z20822 Contact with and (suspected) exposure to covid-19: Secondary | ICD-10-CM | POA: Diagnosis present

## 2020-02-12 DIAGNOSIS — R5383 Other fatigue: Secondary | ICD-10-CM | POA: Diagnosis not present

## 2020-02-12 DIAGNOSIS — Z7901 Long term (current) use of anticoagulants: Secondary | ICD-10-CM | POA: Diagnosis not present

## 2020-02-12 DIAGNOSIS — I6523 Occlusion and stenosis of bilateral carotid arteries: Secondary | ICD-10-CM | POA: Diagnosis not present

## 2020-02-12 DIAGNOSIS — I10 Essential (primary) hypertension: Secondary | ICD-10-CM

## 2020-02-12 DIAGNOSIS — I081 Rheumatic disorders of both mitral and tricuspid valves: Secondary | ICD-10-CM | POA: Diagnosis present

## 2020-02-12 DIAGNOSIS — G4733 Obstructive sleep apnea (adult) (pediatric): Secondary | ICD-10-CM | POA: Diagnosis present

## 2020-02-12 DIAGNOSIS — Z885 Allergy status to narcotic agent status: Secondary | ICD-10-CM | POA: Diagnosis not present

## 2020-02-12 DIAGNOSIS — Z8249 Family history of ischemic heart disease and other diseases of the circulatory system: Secondary | ICD-10-CM

## 2020-02-12 DIAGNOSIS — I739 Peripheral vascular disease, unspecified: Secondary | ICD-10-CM | POA: Diagnosis present

## 2020-02-12 DIAGNOSIS — Z8673 Personal history of transient ischemic attack (TIA), and cerebral infarction without residual deficits: Secondary | ICD-10-CM

## 2020-02-12 DIAGNOSIS — I2510 Atherosclerotic heart disease of native coronary artery without angina pectoris: Secondary | ICD-10-CM | POA: Diagnosis present

## 2020-02-12 DIAGNOSIS — Z6829 Body mass index (BMI) 29.0-29.9, adult: Secondary | ICD-10-CM

## 2020-02-12 DIAGNOSIS — Z86718 Personal history of other venous thrombosis and embolism: Secondary | ICD-10-CM

## 2020-02-12 DIAGNOSIS — I5022 Chronic systolic (congestive) heart failure: Secondary | ICD-10-CM | POA: Diagnosis present

## 2020-02-12 DIAGNOSIS — I255 Ischemic cardiomyopathy: Secondary | ICD-10-CM | POA: Diagnosis present

## 2020-02-12 DIAGNOSIS — I071 Rheumatic tricuspid insufficiency: Secondary | ICD-10-CM | POA: Diagnosis not present

## 2020-02-12 DIAGNOSIS — J3089 Other allergic rhinitis: Secondary | ICD-10-CM

## 2020-02-12 DIAGNOSIS — Z79899 Other long term (current) drug therapy: Secondary | ICD-10-CM

## 2020-02-12 DIAGNOSIS — R0602 Shortness of breath: Secondary | ICD-10-CM | POA: Diagnosis not present

## 2020-02-12 DIAGNOSIS — K219 Gastro-esophageal reflux disease without esophagitis: Secondary | ICD-10-CM

## 2020-02-12 DIAGNOSIS — E782 Mixed hyperlipidemia: Secondary | ICD-10-CM

## 2020-02-12 DIAGNOSIS — Z880 Allergy status to penicillin: Secondary | ICD-10-CM

## 2020-02-12 DIAGNOSIS — I213 ST elevation (STEMI) myocardial infarction of unspecified site: Secondary | ICD-10-CM

## 2020-02-12 DIAGNOSIS — I219 Acute myocardial infarction, unspecified: Secondary | ICD-10-CM | POA: Diagnosis not present

## 2020-02-12 DIAGNOSIS — I249 Acute ischemic heart disease, unspecified: Secondary | ICD-10-CM | POA: Diagnosis not present

## 2020-02-12 DIAGNOSIS — I2109 ST elevation (STEMI) myocardial infarction involving other coronary artery of anterior wall: Secondary | ICD-10-CM | POA: Diagnosis present

## 2020-02-12 DIAGNOSIS — I4891 Unspecified atrial fibrillation: Secondary | ICD-10-CM | POA: Diagnosis not present

## 2020-02-12 DIAGNOSIS — Z800 Family history of malignant neoplasm of digestive organs: Secondary | ICD-10-CM

## 2020-02-12 DIAGNOSIS — Z8541 Personal history of malignant neoplasm of cervix uteri: Secondary | ICD-10-CM | POA: Diagnosis not present

## 2020-02-12 DIAGNOSIS — R079 Chest pain, unspecified: Secondary | ICD-10-CM | POA: Diagnosis not present

## 2020-02-12 DIAGNOSIS — Z820 Family history of epilepsy and other diseases of the nervous system: Secondary | ICD-10-CM

## 2020-02-12 DIAGNOSIS — R9431 Abnormal electrocardiogram [ECG] [EKG]: Secondary | ICD-10-CM | POA: Diagnosis not present

## 2020-02-12 DIAGNOSIS — I130 Hypertensive heart and chronic kidney disease with heart failure and stage 1 through stage 4 chronic kidney disease, or unspecified chronic kidney disease: Secondary | ICD-10-CM | POA: Diagnosis present

## 2020-02-12 DIAGNOSIS — I482 Chronic atrial fibrillation, unspecified: Secondary | ICD-10-CM

## 2020-02-12 DIAGNOSIS — E669 Obesity, unspecified: Secondary | ICD-10-CM | POA: Diagnosis present

## 2020-02-12 DIAGNOSIS — Z888 Allergy status to other drugs, medicaments and biological substances status: Secondary | ICD-10-CM

## 2020-02-12 DIAGNOSIS — Z7982 Long term (current) use of aspirin: Secondary | ICD-10-CM | POA: Diagnosis not present

## 2020-02-12 DIAGNOSIS — F17210 Nicotine dependence, cigarettes, uncomplicated: Secondary | ICD-10-CM | POA: Diagnosis present

## 2020-02-12 DIAGNOSIS — Z87891 Personal history of nicotine dependence: Secondary | ICD-10-CM

## 2020-02-12 DIAGNOSIS — R0600 Dyspnea, unspecified: Secondary | ICD-10-CM | POA: Diagnosis not present

## 2020-02-12 DIAGNOSIS — Z882 Allergy status to sulfonamides status: Secondary | ICD-10-CM

## 2020-02-12 DIAGNOSIS — N182 Chronic kidney disease, stage 2 (mild): Secondary | ICD-10-CM | POA: Diagnosis present

## 2020-02-12 DIAGNOSIS — I70219 Atherosclerosis of native arteries of extremities with intermittent claudication, unspecified extremity: Secondary | ICD-10-CM | POA: Diagnosis not present

## 2020-02-12 DIAGNOSIS — I959 Hypotension, unspecified: Secondary | ICD-10-CM | POA: Diagnosis not present

## 2020-02-12 DIAGNOSIS — I340 Nonrheumatic mitral (valve) insufficiency: Secondary | ICD-10-CM | POA: Diagnosis not present

## 2020-02-12 DIAGNOSIS — Z136 Encounter for screening for cardiovascular disorders: Secondary | ICD-10-CM | POA: Diagnosis not present

## 2020-02-12 LAB — BASIC METABOLIC PANEL
Anion gap: 9 (ref 5–15)
BUN: 28 mg/dL — ABNORMAL HIGH (ref 8–23)
CO2: 25 mmol/L (ref 22–32)
Calcium: 9 mg/dL (ref 8.9–10.3)
Chloride: 104 mmol/L (ref 98–111)
Creatinine, Ser: 1.19 mg/dL — ABNORMAL HIGH (ref 0.44–1.00)
GFR, Estimated: 48 mL/min — ABNORMAL LOW (ref >60)
Glucose, Bld: 122 mg/dL — ABNORMAL HIGH (ref 70–99)
Potassium: 4.2 mmol/L (ref 3.5–5.1)
Sodium: 138 mmol/L (ref 135–145)

## 2020-02-12 LAB — TROPONIN I (HIGH SENSITIVITY)
Troponin I (High Sensitivity): 24772 ng/L (ref <18)
Troponin I (High Sensitivity): 21875 ng/L (ref <18)
Troponin I (High Sensitivity): 19788 ng/L (ref <18)

## 2020-02-12 LAB — BRAIN NATRIURETIC PEPTIDE: B Natriuretic Peptide: 794.8 pg/mL — ABNORMAL HIGH (ref 0.0–100.0)

## 2020-02-12 LAB — APTT
aPTT: 36 seconds (ref 24–36)
aPTT: 61 seconds — ABNORMAL HIGH (ref 24–36)

## 2020-02-12 LAB — RESP PANEL BY RT-PCR (FLU A&B, COVID) ARPGX2
Influenza A by PCR: NEGATIVE
Influenza B by PCR: NEGATIVE
SARS Coronavirus 2 by RT PCR: NEGATIVE

## 2020-02-12 LAB — CBC
HCT: 41.9 % (ref 36.0–46.0)
Hemoglobin: 12.5 g/dL (ref 12.0–15.0)
MCH: 24.2 pg — ABNORMAL LOW (ref 26.0–34.0)
MCHC: 29.8 g/dL — ABNORMAL LOW (ref 30.0–36.0)
MCV: 81 fL (ref 80.0–100.0)
Platelets: 317 10*3/uL (ref 150–400)
RBC: 5.17 MIL/uL — ABNORMAL HIGH (ref 3.87–5.11)
RDW: 16.3 % — ABNORMAL HIGH (ref 11.5–15.5)
WBC: 12.4 10*3/uL — ABNORMAL HIGH (ref 4.0–10.5)
nRBC: 0 % (ref 0.0–0.2)

## 2020-02-12 LAB — HEPARIN LEVEL (UNFRACTIONATED): Heparin Unfractionated: 1.34 IU/mL — ABNORMAL HIGH (ref 0.30–0.70)

## 2020-02-12 LAB — PROTIME-INR
INR: 1.3 — ABNORMAL HIGH (ref 0.8–1.2)
Prothrombin Time: 16 seconds — ABNORMAL HIGH (ref 11.4–15.2)

## 2020-02-12 SURGERY — CORONARY/GRAFT ACUTE MI REVASCULARIZATION
Anesthesia: Moderate Sedation

## 2020-02-12 MED ORDER — ASPIRIN 81 MG PO CHEW
324.0000 mg | CHEWABLE_TABLET | ORAL | Status: AC
  Administered 2020-02-12: 324 mg via ORAL

## 2020-02-12 MED ORDER — SODIUM CHLORIDE 0.9% FLUSH
10.0000 mL | Freq: Two times a day (BID) | INTRAVENOUS | Status: DC
  Administered 2020-02-12 – 2020-02-15 (×3): 10 mL via INTRAVENOUS

## 2020-02-12 MED ORDER — PERFLUTREN LIPID MICROSPHERE
1.0000 mL | INTRAVENOUS | Status: AC | PRN
  Administered 2020-02-12: 2 mL via INTRAVENOUS
  Filled 2020-02-12: qty 10

## 2020-02-12 MED ORDER — PANTOPRAZOLE SODIUM 40 MG PO TBEC
40.0000 mg | DELAYED_RELEASE_TABLET | Freq: Every day | ORAL | Status: DC
  Administered 2020-02-12 – 2020-02-15 (×4): 40 mg via ORAL
  Filled 2020-02-12 (×4): qty 1

## 2020-02-12 MED ORDER — ONDANSETRON HCL 4 MG/2ML IJ SOLN
4.0000 mg | Freq: Four times a day (QID) | INTRAMUSCULAR | Status: DC | PRN
  Administered 2020-02-13: 4 mg via INTRAVENOUS
  Filled 2020-02-12: qty 2

## 2020-02-12 MED ORDER — MONTELUKAST SODIUM 10 MG PO TABS
10.0000 mg | ORAL_TABLET | Freq: Every day | ORAL | Status: DC | PRN
Start: 2020-02-12 — End: 2020-02-15
  Filled 2020-02-12: qty 1

## 2020-02-12 MED ORDER — HEPARIN BOLUS VIA INFUSION
1100.0000 [IU] | Freq: Once | INTRAVENOUS | Status: AC
  Administered 2020-02-13: 1100 [IU] via INTRAVENOUS
  Filled 2020-02-12: qty 1100

## 2020-02-12 MED ORDER — NITROGLYCERIN 0.4 MG SL SUBL: 0.4000 mg | SUBLINGUAL_TABLET | SUBLINGUAL | Status: DC | PRN

## 2020-02-12 MED ORDER — HEPARIN BOLUS VIA INFUSION
4000.0000 [IU] | Freq: Once | INTRAVENOUS | Status: AC
  Administered 2020-02-12: 4000 [IU] via INTRAVENOUS
  Filled 2020-02-12: qty 4000

## 2020-02-12 MED ORDER — HEPARIN (PORCINE) 25000 UT/250ML-% IV SOLN
1200.0000 [IU]/h | INTRAVENOUS | Status: DC
  Administered 2020-02-12: 1050 [IU]/h via INTRAVENOUS
  Administered 2020-02-13: 1200 [IU]/h via INTRAVENOUS
  Filled 2020-02-12 (×2): qty 250

## 2020-02-12 MED ORDER — LISINOPRIL 5 MG PO TABS
2.5000 mg | ORAL_TABLET | Freq: Every day | ORAL | Status: DC
Start: 2020-02-12 — End: 2020-02-15
  Administered 2020-02-12 – 2020-02-15 (×3): 2.5 mg via ORAL
  Filled 2020-02-12 (×4): qty 1

## 2020-02-12 MED ORDER — ACETAMINOPHEN 325 MG PO TABS
650.0000 mg | ORAL_TABLET | ORAL | Status: DC | PRN
  Administered 2020-02-14: 650 mg via ORAL
  Filled 2020-02-12: qty 2

## 2020-02-12 MED ORDER — ASPIRIN EC 81 MG PO TBEC
81.0000 mg | DELAYED_RELEASE_TABLET | Freq: Every day | ORAL | Status: DC
  Administered 2020-02-13: 81 mg via ORAL
  Filled 2020-02-12: qty 1

## 2020-02-12 MED ORDER — ASPIRIN 300 MG RE SUPP: 300.0000 mg | RECTAL | Status: AC

## 2020-02-12 MED ORDER — PRAVASTATIN SODIUM 40 MG PO TABS
40.0000 mg | ORAL_TABLET | Freq: Every day | ORAL | Status: DC
  Administered 2020-02-12 – 2020-02-13 (×2): 40 mg via ORAL
  Filled 2020-02-12 (×3): qty 1

## 2020-02-12 MED ORDER — METOPROLOL TARTRATE 50 MG PO TABS
50.0000 mg | ORAL_TABLET | Freq: Two times a day (BID) | ORAL | Status: DC
  Administered 2020-02-12 – 2020-02-15 (×7): 50 mg via ORAL
  Filled 2020-02-12 (×7): qty 1

## 2020-02-12 MED ORDER — ALBUTEROL SULFATE HFA 108 (90 BASE) MCG/ACT IN AERS
2.0000 | INHALATION_SPRAY | Freq: Four times a day (QID) | RESPIRATORY_TRACT | Status: DC | PRN
  Filled 2020-02-12: qty 6.7

## 2020-02-12 MED ORDER — LIDOCAINE HCL (PF) 1 % IJ SOLN
INTRAMUSCULAR | Status: AC
  Filled 2020-02-12: qty 30

## 2020-02-12 MED ORDER — POLYETHYLENE GLYCOL 3350 17 G PO PACK: 17.0000 g | PACK | Freq: Every day | ORAL | Status: DC | PRN

## 2020-02-12 NOTE — ED Provider Notes (Signed)
[facility] Emergency Department Provider Note ____________________________________________   Event Date/Time   First MD Initiated Contact with Patient 02/12/20 1129     (approximate)  I have reviewed the triage vital signs and the nursing notes.   HISTORY  Chief Complaint Chest Pain    HPI Makenah Karas Fackrell is a 75 y.o. female with PMH as noted below including CAD and atrial fibrillation who presents with an episode of chest pain that occurred 5 days ago.  She went to her PMD today and was sent in due to an abnormal EKG.  The patient states that last Thursday evening she felt chest pain "like an elephant sitting on my chest" that lasted about 2 hours and subsequently resolved.  Her arms felt heavy at the same time.  She has had some increased shortness of breath since then but no further chest pain.  She denies any acute symptoms at this time.  Past Medical History:  Diagnosis Date  . Acid reflux    takes Nexium daily  . Allergy    takes Singulair daily as needed  . Anemia   . Atrial fibrillation ([place])   . Cancer ([place]) AGE 58   CERVICAL cancer with removal  . Chronic back pain    stenosis  . Complication of anesthesia   . Constipation    takes Colace daily as needed  . Coronary artery disease   . Difficult intubation   . Dysrhythmia    atrial fibrillation dx 04/2014  . Headache    sinus   . History of blood transfusion   . History of shingles   . Hyperlipidemia    takes Pravastatin daily-started taking this 6 months ago  . Hypertension   . Lumbar surgical wound fluid collection   . Sleep apnea    CPAP  . Stroke ([place])    TIA'S X 2 --- one last yr and one this yr...lost her memory over 4-6 hrs.  . Stroke ([place]) 06-04-15   left with right side weakness  . TIA (transient ischemic attack)    showed up on an MRI but she never knew it  . Urinary frequency     Patient Active Problem List   Diagnosis Date Noted  . STEMI (ST elevation myocardial  infarction) ([place]) 02/12/2020  . Atheroembolism of foot, left ([place]) 01/11/2019  . Venous insufficiency of both lower extremities 11/02/2017  . Pain and swelling of lower leg 06/20/2017  . Benign essential HTN 11/01/2016  . SOBOE (shortness of breath on exertion) 11/01/2016  . Gastroenteritis 08/16/2016  . Bilateral carotid artery stenosis 03/12/2016  . Fine tremor 03/12/2016  . PAD (peripheral artery disease) ([place]) 01/14/2016  . Atherosclerosis of native arteries of extremity with rest pain ([place]) 11/10/2015  . Cough 06/09/2015  . Abnormal blood sugar 04/11/2015  . Anxiety 11/19/2014  . PTSD (post-traumatic stress disorder) 10/29/2014  . Allergic rhinitis 09/05/2014  . Mixed hyperlipidemia 09/05/2014  . Mild memory disturbance 09/05/2014  . H/O stroke without residual deficits 09/05/2014  . Edema 09/05/2014  . Spinal stenosis, lumbar 07/04/2014  . MI (mitral incompetence) 05/08/2014  . TI (tricuspid incompetence) 05/08/2014  . Chronic atrial fibrillation ([place]) 05/08/2014  . History of tobacco abuse 04/29/2014  . Gastro-esophageal reflux disease without esophagitis 04/29/2014  . Obstructive apnea 04/29/2014  . Atrial fibrillation by electrocardiography ([place]) 04/29/2014  . Nicotine dependence, cigarettes, uncomplicated 17/49/4496  . Neuritis or radiculitis due to rupture of lumbar intervertebral disc 09/21/2013  . DDD (degenerative disc disease),  lumbar 08/24/2013  . Bursitis, trochanteric 06/18/2013  . Nerve root inflammation 06/18/2013  . Arthralgia of hip 06/04/2013  . Calculus of gallbladder with other cholecystitis, without mention of obstruction 04/27/2012    Past Surgical History:  Procedure Laterality Date  . ABDOMINAL HYSTERECTOMY    . APPENDECTOMY  1963  . BACK SURGERY  08/19/2014   Dr. Hal Neer ([place] [facility])  . BREAST SURGERY  1984   augementation  . [place]  . CHOLECYSTECTOMY  2014   Dr Jamal Collin  . COLONOSCOPY    . ESOPHAGOGASTRODUODENOSCOPY     . HAMMER TOE SURGERY Bilateral   . LOWER EXTREMITY ANGIOGRAPHY Left 01/23/2019   Procedure: LOWER EXTREMITY ANGIOGRAPHY;  Surgeon: Katha Cabal, MD;  Location: [facility];  Service: Cardiovascular;  Laterality: Left;  . LOWER EXTREMITY ANGIOGRAPHY Left 02/20/2019   Procedure: LOWER EXTREMITY ANGIOGRAPHY;  Surgeon: Katha Cabal, MD;  Location: [facility];  Service: Cardiovascular;  Laterality: Left;  . LUMBAR LAMINECTOMY WITH COFLEX 1 LEVEL Bilateral 07/04/2014   Procedure: Laminectomy and Foraminotomy - bilateral - Lumbar Four-Five with coflex ;  Surgeon: Karie Chimera, MD;  Location: [place] [facility];  Service: Neurosurgery;  Laterality: Bilateral;  . LUMBAR WOUND DEBRIDEMENT N/A 08/19/2014   Procedure: Exploration of Lumbar wound;  Surgeon: Karie Chimera, MD;  Location: [place] [facility];  Service: Neurosurgery;  Laterality: N/A;  . PERIPHERAL VASCULAR CATHETERIZATION Left 08/12/2015   Procedure: Lower Extremity Angiography;  Surgeon: Katha Cabal, MD;  Location: [facility];  Service: Cardiovascular;  Laterality: Left;  . TONSILLECTOMY    . VENTRAL HERNIA REPAIR N/A 05/26/2015   Procedure: HERNIA REPAIR VENTRAL ADULT;  Surgeon: Christene Lye, MD;  Location: [facility] ORS;  Service: General;  Laterality: N/A;    Prior to Admission medications   Medication Sig Start Date End Date Taking? Authorizing Provider  albuterol (PROVENTIL HFA;VENTOLIN HFA) 108 (90 Base) MCG/ACT inhaler Inhale 2 puffs into the lungs every 6 (six) hours as needed for wheezing or shortness of breath. 06/09/16   Mar Daring, PA-C  aspirin EC 81 MG EC tablet Take 1 tablet (81 mg total) by mouth daily. 06/08/15   Henreitta Leber, MD  FLUoxetine (PROZAC) 20 MG capsule TAKE 1 CAPSULE BY MOUTH EVERY DAY 11/28/19   Mar Daring, PA-C  furosemide (LASIX) 40 MG tablet TAKE 1 TABLET BY MOUTH EVERY DAY 07/03/18   Mar Daring, PA-C  gabapentin (NEURONTIN) 300 MG capsule TAKE 1  CAPSULE BY MOUTH EVERYDAY AT BEDTIME 01/31/20   Kris Hartmann, NP  HYDROcodone-acetaminophen (NORCO) 5-325 MG tablet Take 1-2 tablets by mouth every 8 (eight) hours as needed for moderate pain or severe pain. 01/29/20   Mar Daring, PA-C  KLOR-CON M20 20 MEQ tablet TAKE 1 TABLET BY MOUTH EVERY DAY 09/12/17   Mar Daring, PA-C  lisinopril (ZESTRIL) 2.5 MG tablet TAKE 1 TABLET BY MOUTH EVERY DAY 11/28/19   Mar Daring, PA-C  lovastatin (MEVACOR) 40 MG tablet Take 1 tablet (40 mg total) by mouth at bedtime. 08/24/19   Mar Daring, PA-C  metoprolol tartrate (LOPRESSOR) 25 MG tablet TAKE 2 TABLETS BY MOUTH TWICE A DAY Patient taking differently: Take 50 mg by mouth 2 (two) times daily.  08/14/18   Mar Daring, PA-C  montelukast (SINGULAIR) 10 MG tablet Take 1 tablet (10 mg total) by mouth at bedtime. Patient taking differently: Take 10 mg by mouth daily as needed (allergies).  01/23/15   Margarita Rana, MD  omeprazole (PRILOSEC) 40 MG capsule TAKE 1 CAPSULE BY MOUTH EVERY DAY 09/02/19   Mar Daring, PA-C  ondansetron (ZOFRAN ODT) 4 MG disintegrating tablet Take 1 tablet (4 mg total) by mouth every 8 (eight) hours as needed. 08/07/19   Lavonia Drafts, MD  ondansetron (ZOFRAN) 4 MG tablet Take 1 tablet (4 mg total) by mouth every 8 (eight) hours as needed for nausea or vomiting. Patient not taking: Reported on 08/24/2019 08/02/19   Mar Daring, PA-C  rivaroxaban (XARELTO) 20 MG TABS tablet Take 20 mg by mouth daily with supper.  08/27/14   [provider]  traMADol (ULTRAM) 50 MG tablet Take 50 mg by mouth every 6 (six) hours as needed for moderate pain.     [provider]    Allergies Codeine, Penicillins, Adhesive [tape], and Sulfa antibiotics  Family History  Problem Relation Age of Onset  . Alzheimer's disease Mother   . Heart attack Father   . Pancreatic cancer Sister   . Healthy Brother     Social History Social  History   Tobacco Use  . Smoking status: Former Smoker    Packs/day: 1.00    Years: 47.00    Pack years: 47.00    Quit date: 08/19/2014    Years since quitting: 5.4  . Smokeless tobacco: Former Systems developer    Quit date: 08/19/2014  Vaping Use  . Vaping Use: Never used  Substance Use Topics  . Alcohol use: No  . Drug use: No    Review of Systems  Constitutional: No fever. Eyes: No redness. ENT: No sore throat. Cardiovascular: Positive for resolved chest pain. Respiratory: Denies acute shortness of breath. Gastrointestinal: No vomiting or diarrhea.  Genitourinary: Negative for flank pain. Musculoskeletal: Negative for back pain. Skin: Negative for rash. Neurological: Negative for headaches, focal weakness or numbness.   ____________________________________________   PHYSICAL EXAM:  VITAL SIGNS: ED Triage Vitals  Enc Vitals Group     BP 02/12/20 1120 103/64     Pulse Rate 02/12/20 1120 99     Resp 02/12/20 1120 19     Temp 02/12/20 1120 98.1 F (36.7 C)     Temp src --      SpO2 02/12/20 1120 97 %     Weight 02/12/20 1118 170 lb (77.1 kg)     Height 02/12/20 1118 5\' 5"  (1.651 m)     Head Circumference --      Peak Flow --      Pain Score 02/12/20 1118 0     Pain Loc --      Pain Edu? --      Excl. in [place]? --     Constitutional: Alert and oriented. Well appearing and in no acute distress. Eyes: Conjunctivae are normal.  Head: Atraumatic. Nose: No congestion/rhinnorhea. Mouth/Throat: Mucous membranes are moist.   Neck: Normal range of motion.  Cardiovascular: Normal rate, regular rhythm. Grossly normal heart sounds.  Good peripheral circulation. Respiratory: Normal respiratory effort.  No retractions. Lungs CTAB. Gastrointestinal: Soft and nontender. No distention.  Genitourinary: No flank tenderness. Musculoskeletal: No lower extremity edema.  Extremities warm and well perfused.  Neurologic:  Normal speech and language. No gross focal neurologic deficits are  appreciated.  Skin:  Skin is warm and dry. No rash noted. Psychiatric: Mood and affect are normal. Speech and behavior are normal.  ____________________________________________   LABS (all labs ordered are listed, but only abnormal results are displayed)  Labs  Reviewed  BASIC METABOLIC PANEL - Abnormal; Notable for the following components:      Result Value   Glucose, Bld 122 (*)    BUN 28 (*)    Creatinine, Ser 1.19 (*)    GFR, Estimated 48 (*)    All other components within normal limits  CBC - Abnormal; Notable for the following components:   WBC 12.4 (*)    RBC 5.17 (*)    MCH 24.2 (*)    MCHC 29.8 (*)    RDW 16.3 (*)    All other components within normal limits  PROTIME-INR - Abnormal; Notable for the following components:   Prothrombin Time 16.0 (*)    INR 1.3 (*)    All other components within normal limits  HEPARIN LEVEL (UNFRACTIONATED) - Abnormal; Notable for the following components:   Heparin Unfractionated 1.34 (*)    All other components within normal limits  TROPONIN I (HIGH SENSITIVITY) - Abnormal; Notable for the following components:   Troponin I (High Sensitivity) 97,673 (*)    All other components within normal limits  RESP PANEL BY RT-PCR (FLU A&B, COVID) ARPGX2  APTT  TROPONIN I (HIGH SENSITIVITY)   ____________________________________________  EKG  ED ECG REPORT I, Arta Silence, the attending physician, personally viewed and interpreted this ECG.  Date: 02/12/2020 EKG Time: 1118 Rate: 109 Rhythm: Atrial fibrillation QRS Axis: Left axis Intervals: normal ST/T Wave abnormalities: 1 to 2 mm ST elevations in V4-V6 Narrative Interpretation: New borderline ST elevations in the lateral leads concerning for acute ischemia  ____________________________________________  RADIOLOGY  Chest x-ray interpreted by me shows no focal infiltrate or edema  ____________________________________________   PROCEDURES  Procedure(s) performed:  No  Procedures  Critical Care performed: Yes  CRITICAL CARE Performed by: Arta Silence   Total critical care time: 30 minutes  Critical care time was exclusive of separately billable procedures and treating other patients.  Critical care was necessary to treat or prevent imminent or life-threatening deterioration.  Critical care was time spent personally by me on the following activities: development of treatment plan with patient and/or surrogate as well as nursing, discussions with consultants, evaluation of patient's response to treatment, examination of patient, obtaining history from patient or surrogate, ordering and performing treatments and interventions, ordering and review of laboratory studies, ordering and review of radiographic studies, pulse oximetry and re-evaluation of patient's condition. ____________________________________________   INITIAL IMPRESSION / ASSESSMENT AND PLAN / ED COURSE  Pertinent labs & imaging results that were available during my care of the patient were reviewed by me and considered in my medical decision making (see chart for details).  75 year old female with PMH as noted above including a history of CAD and atrial fibrillation presents after she had an episode of chest pain 5 days ago.  Her EKG was abnormal when she went for an outpatient visit today and she was referred to the ED.    I reviewed the past medical records in [place].  The patient was most recently seen in the ED last summer with  vomiting and diarrhea.  She has no recent admissions.  Her last echocardiogram was in 2017.  On exam, the patient is overall well-appearing.  Her vital signs are normal.  The physical exam is otherwise unremarkable.  EKG was handed to me while the patient was in triage and showed new ST elevations in the lateral leads.  I immediately activated code STEMI.  When the patient arrived in the room I obtained her history and  discussed the case with Dr. Saunders Revel  who will come to evaluate the patient.  ----------------------------------------- 11:52 AM on 02/12/2020 -----------------------------------------  Dr. Saunders Revel has evaluated the patient.  Given that the patient has not had any chest pain since last Thursday and has no other active symptoms, he suspects that she likely had an acute MI at that time.  He advises that there is no indication for emergent catheterization, and the patient agrees.  We will obtain labs including cardiac enzymes and plan for admission for ACS rule out.  ----------------------------------------- 1:34 PM on 02/12/2020 -----------------------------------------  Troponin is significant elevated consistent with an acute MI several days ago.  I discussed the case with Dr. Clayborn Bigness from cardiology who will take over as the consultant.  He recommended heparin infusion which has been ordered.  I then discussed the case with Dr. Cyd Silence from the hospitalist service for admission. ____________________________________________   FINAL CLINICAL IMPRESSION(S) / ED DIAGNOSES  Final diagnoses:  Myocardial infarction, unspecified MI type, unspecified artery ([place])      NEW MEDICATIONS STARTED DURING THIS VISIT:  New Prescriptions   No medications on file     Note:  This document was prepared using Dragon voice recognition software and may include unintentional dictation errors.   Arta Silence, MD 02/12/20 1335

## 2020-02-12 NOTE — Consult Note (Signed)
ANTICOAGULATION CONSULT NOTE - Initial Consult  Pharmacy Consult for Heparin Infusion Indication: atrial fibrillation and ACS  Allergies  Allergen Reactions  . Codeine Other (See Comments)    Makes her feel "crazy"  . Penicillins Swelling    Did it involve swelling of the face/tongue/throat, SOB, or low BP? Unknown Did it involve sudden or severe rash/hives, skin peeling, or any reaction on the inside of your mouth or nose? No Did you need to seek medical attention at a hospital or doctor's office? Unknown When did it last happen?Childhood allergy If all above answers are "NO", may proceed with cephalosporin use.   . Adhesive [Tape] Hives    (Latex)  . Sulfa Antibiotics Nausea Only and Rash    Patient Measurements: Height: 5\' 5"  (165.1 cm) Weight: 80.6 kg (177 lb 12.8 oz) IBW/kg (Calculated) : 57 Heparin Dosing Weight: 77.1 kg  Vital Signs: Temp: 99.5 F (37.5 C) (02/08 1631) Temp Source: Oral (02/08 1631) BP: 104/52 (02/08 1631) Pulse Rate: 71 (02/08 1631)  Labs: Recent Labs    02/12/20 1120 02/12/20 1136 02/12/20 1307 02/12/20 2049  HGB 12.5  --   --   --   HCT 41.9  --   --   --   PLT 317  --   --   --   APTT  --  36  --  61*  LABPROT  --  16.0*  --   --   INR  --  1.3*  --   --   HEPARINUNFRC  --  1.34*  --   --   CREATININE 1.19*  --   --   --   VQQVZDGLOVF 64,332*  --  21,875* 95,188*    Estimated Creatinine Clearance: 43.5 mL/min (A) (by C-G formula based on SCr of 1.19 mg/dL (H)).   Medical History: Past Medical History:  Diagnosis Date  . Acid reflux    takes Nexium daily  . Allergy    takes Singulair daily as needed  . Anemia   . Atrial fibrillation ([place])   . Cancer ([place]) AGE 13   CERVICAL cancer with removal  . Chronic back pain    stenosis  . Complication of anesthesia   . Constipation    takes Colace daily as needed  . Coronary artery disease   . Difficult intubation   . Dysrhythmia    atrial fibrillation dx 04/2014  .  Headache    sinus   . History of blood transfusion   . History of shingles   . Hyperlipidemia    takes Pravastatin daily-started taking this 6 months ago  . Hypertension   . Lumbar surgical wound fluid collection   . Sleep apnea    CPAP  . Stroke ([place])    TIA'S X 2 --- one last yr and one this yr...lost her memory over 4-6 hrs.  . Stroke ([place]) 06-04-15   left with right side weakness  . TIA (transient ischemic attack)    showed up on an MRI but she never knew it  . Urinary frequency      Assessment: Pt is a 75 y.o female admitted to ED for NSTEMI/ACS with a PMH of A.fib. Currently takes Xarelto at home (last reported dose 2/7 AM). EKG reported A.fib with rapid ventricular response, left axis deviation, low voltage, anterolateral infarct and nonspecific infarct and nonspecific ST changes. Per cards, catherization deferred, pending further workup. Plan is to transition from Xarelto to heparin in case invasive procedures are needed in hospitalization.  Pharmacy was consulted for Heparin dosing and monitoring.  Troponin HS: 24,772>>21,875  Date Time HL/aPTT Rate/Comment 02/08 1136 1.34 / 36s W/o correlation d/t DOAC 02/08 2049 61s  Slightly subthera; 1050 un/hr  Heparin Dosing Weight: 77.1 kg  Goal of Therapy:  Heparin level 0.3-0.7 units/ml Monitor platelets by anticoagulation protocol: Yes   Plan:  Slightly subtherapeutic by aPTT.  Bolus 1100 units x1; then increase rate to 1200 units/hr. Check aptt level in 8 hours and HL daily while on heparin Continue to monitor H&H and platelets  Note - Baseline HL elevated due to Xarelto, will need to adjust heparin infusion based on aPTT level until heparin and aPTT levels correlate   Lorna Dibble, PharmD 02/12/2020,10:12 PM

## 2020-02-12 NOTE — Progress Notes (Signed)
  Chaplain On-Call responded to Code STEMI notification.  Met patient at bedside while Dr. Saunders Revel discussed a plan of care with the patient.  Patient requested prayer, which this Chaplain provided along with spiritual and emotional support.  Patient stated that she is awaiting for her sister to arrive from the ED parking lot to visit with her.  [place] Josy Peaden M.Div., [facility]

## 2020-02-12 NOTE — ED Notes (Signed)
Per MD, pt not going to cath lab at this time.

## 2020-02-12 NOTE — Consult Note (Signed)
Cardiology Consultation:   Patient ID: Kayla Maxwell MRN: [id number]; DOB: [birth year]  Admit date: 02/12/2020 Date of Consult: 02/12/2020  Primary Care Provider: Mar Daring, PA-C [facility] Cardiologist: Kayla Royals, MD [facility] Electrophysiologist:  None    Patient Profile:   Kayla Maxwell is a 75 y.o. female with a hx of coronary artery disease (details uncertain), chronic atrial fibrillation, valvular heart disease with moderate mitral and tricuspid regurgitation, PAD, stroke with bilateral carotid artery stenosis, hypertension, hyperlipidemia, and sleep apnea, who is being seen today for the evaluation of possible STEMI at the request of Kayla Maxwell.  History of Present Illness:   [name] reports that she was in her usual state of health until last Thursday (4 days ago) when she had sudden onset of severe substernal chest pressure radiating to the left arm.  The pain was 10/10 in intensity and lasted for about 2 hours after which time it resolved.  She did not seek medical attention.  She has noticed progressive shortness of breath since that time but otherwise feels close to normal.  She denies palpitations and lightheadedness as well as edema.  She is currently chest pain-free.  She presented to Dr. Alveria Maxwell office this morning for further evaluation and was seen by Kayla Maxwell.  EKG was concerning for possible acute MI, prompting referral to the emergency department.  Past Medical History:  Diagnosis Date  . Acid reflux    takes Nexium daily  . Allergy    takes Singulair daily as needed  . Anemia   . Atrial fibrillation ([place])   . Cancer ([place]) AGE 38   CERVICAL cancer with removal  . Chronic back pain    stenosis  . Complication of anesthesia   . Constipation    takes Colace daily as needed  . Coronary artery disease   . Difficult intubation   . Dysrhythmia    atrial fibrillation dx 04/2014  . Headache    sinus   . History of blood  transfusion   . History of shingles   . Hyperlipidemia    takes Pravastatin daily-started taking this 6 months ago  . Hypertension   . Lumbar surgical wound fluid collection   . Sleep apnea    CPAP  . Stroke ([place])    TIA'S X 2 --- one last yr and one this yr...lost her memory over 4-6 hrs.  . Stroke ([place]) 06-04-15   left with right side weakness  . TIA (transient ischemic attack)    showed up on an MRI but she never knew it  . Urinary frequency     Past Surgical History:  Procedure Laterality Date  . ABDOMINAL HYSTERECTOMY    . APPENDECTOMY  1963  . BACK SURGERY  08/19/2014   Dr. Hal Neer ([place] otho)  . BREAST SURGERY  1984   augementation  . [place]  . CHOLECYSTECTOMY  2014   Dr Jamal Collin  . COLONOSCOPY    . ESOPHAGOGASTRODUODENOSCOPY    . HAMMER TOE SURGERY Bilateral   . LOWER EXTREMITY ANGIOGRAPHY Left 01/23/2019   Procedure: LOWER EXTREMITY ANGIOGRAPHY;  Surgeon: Katha Cabal, MD;  Location: [place] CV LAB;  Service: Cardiovascular;  Laterality: Left;  . LOWER EXTREMITY ANGIOGRAPHY Left 02/20/2019   Procedure: LOWER EXTREMITY ANGIOGRAPHY;  Surgeon: Katha Cabal, MD;  Location: [place] CV LAB;  Service: Cardiovascular;  Laterality: Left;  . LUMBAR LAMINECTOMY WITH COFLEX 1 LEVEL Bilateral 07/04/2014   Procedure: Laminectomy and Foraminotomy -  bilateral - Lumbar Four-Five with coflex ;  Surgeon: Karie Chimera, MD;  Location: [place] NEURO ORS;  Service: Neurosurgery;  Laterality: Bilateral;  . LUMBAR WOUND DEBRIDEMENT N/A 08/19/2014   Procedure: Exploration of Lumbar wound;  Surgeon: Karie Chimera, MD;  Location: [place] NEURO ORS;  Service: Neurosurgery;  Laterality: N/A;  . PERIPHERAL VASCULAR CATHETERIZATION Left 08/12/2015   Procedure: Lower Extremity Angiography;  Surgeon: Katha Cabal, MD;  Location: [place] CV LAB;  Service: Cardiovascular;  Laterality: Left;  . TONSILLECTOMY    . VENTRAL HERNIA REPAIR N/A 05/26/2015   Procedure:  HERNIA REPAIR VENTRAL ADULT;  Surgeon: Christene Lye, MD;  Location: [facility] ORS;  Service: General;  Laterality: N/A;     Home Medications:  Prior to Admission medications   Medication Sig Start Date Kayla Maxwell Date Taking? Authorizing Provider  albuterol (PROVENTIL HFA;VENTOLIN HFA) 108 (90 Base) MCG/ACT inhaler Inhale 2 puffs into the lungs every 6 (six) hours as needed for wheezing or shortness of breath. 06/09/16   Kayla Daring, PA-C  aspirin EC 81 MG EC tablet Take 1 tablet (81 mg total) by mouth daily. 06/08/15   Henreitta Leber, MD  FLUoxetine (PROZAC) 20 MG capsule TAKE 1 CAPSULE BY MOUTH EVERY DAY 11/28/19   Kayla Daring, PA-C  furosemide (LASIX) 40 MG tablet TAKE 1 TABLET BY MOUTH EVERY DAY 07/03/18   Kayla Daring, PA-C  gabapentin (NEURONTIN) 300 MG capsule TAKE 1 CAPSULE BY MOUTH EVERYDAY AT BEDTIME 01/31/20   Kris Hartmann, Maxwell  HYDROcodone-acetaminophen (NORCO) 5-325 MG tablet Take 1-2 tablets by mouth every 8 (eight) hours as needed for moderate pain or severe pain. 01/29/20   Kayla Daring, PA-C  KLOR-CON M20 20 MEQ tablet TAKE 1 TABLET BY MOUTH EVERY DAY 09/12/17   Kayla Daring, PA-C  lisinopril (ZESTRIL) 2.5 MG tablet TAKE 1 TABLET BY MOUTH EVERY DAY 11/28/19   Kayla Daring, PA-C  lovastatin (MEVACOR) 40 MG tablet Take 1 tablet (40 mg total) by mouth at bedtime. 08/24/19   Kayla Daring, PA-C  metoprolol tartrate (LOPRESSOR) 25 MG tablet TAKE 2 TABLETS BY MOUTH TWICE A DAY Patient taking differently: Take 50 mg by mouth 2 (two) times daily.  08/14/18   Kayla Daring, PA-C  montelukast (SINGULAIR) 10 MG tablet Take 1 tablet (10 mg total) by mouth at bedtime. Patient taking differently: Take 10 mg by mouth daily as needed (allergies).  01/23/15   Margarita Rana, MD  omeprazole (PRILOSEC) 40 MG capsule TAKE 1 CAPSULE BY MOUTH EVERY DAY 09/02/19   Kayla Daring, PA-C  ondansetron (ZOFRAN ODT) 4 MG disintegrating tablet Take 1  tablet (4 mg total) by mouth every 8 (eight) hours as needed. 08/07/19   Lavonia Drafts, MD  ondansetron (ZOFRAN) 4 MG tablet Take 1 tablet (4 mg total) by mouth every 8 (eight) hours as needed for nausea or vomiting. Patient not taking: Reported on 08/24/2019 08/02/19   Kayla Daring, PA-C  rivaroxaban (XARELTO) 20 MG TABS tablet Take 20 mg by mouth daily with supper.  08/27/14   [provider]  traMADol (ULTRAM) 50 MG tablet Take 50 mg by mouth every 6 (six) hours as needed for moderate pain.     [provider]    Inpatient Medications: Scheduled Meds:  Continuous Infusions:  PRN Meds:   Allergies:    Allergies  Allergen Reactions  . Codeine Other (See Comments)    Makes her feel "crazy"  . Penicillins Swelling  Did it involve swelling of the face/tongue/throat, SOB, or low BP? Unknown Did it involve sudden or severe rash/hives, skin peeling, or any reaction on the inside of your mouth or nose? No Did you need to seek medical attention at a hospital or doctor's office? Unknown When did it last happen?Childhood allergy If all above answers are "NO", may proceed with cephalosporin use.   . Adhesive [Tape] Hives  . Sulfa Antibiotics Nausea Only and Rash    Social History:   Social History   Tobacco Use  . Smoking status: Former Smoker    Packs/day: 1.00    Years: 47.00    Pack years: 47.00    Quit date: 08/19/2014    Years since quitting: 5.4  . Smokeless tobacco: Former Systems developer    Quit date: 08/19/2014  Vaping Use  . Vaping Use: Never used  Substance Use Topics  . Alcohol use: No  . Drug use: No     Family History:   Family History  Problem Relation Age of Onset  . Alzheimer's disease Mother   . Heart attack Father   . Pancreatic cancer Sister   . Healthy Brother      ROS:  Please see the history of present illness. All other ROS reviewed and negative.     Physical Exam/Data:   Vitals:   02/12/20 1120 02/12/20 1140 02/12/20  1145 02/12/20 1150  BP: 103/64 97/84 120/77 (!) 109/55  Pulse: 99 99 95 (!) 107  Resp: 19 16 (!) 24 20  Temp: 98.1 F (36.7 C)     SpO2: 97% 98% 97% 98%  Weight:      Height:       No intake or output data in the 24 hours ending 02/12/20 1224 Last 3 Weights 02/12/2020 08/24/2019 08/13/2019  Weight (lbs) 170 lb 192 lb 3.2 oz 193 lb 9.6 oz  Weight (kg) 77.111 kg 87.181 kg 87.816 kg     Body mass index is 28.29 kg/m.  General:  Well nourished, well developed, in no acute distress HEENT: normal Lymph: no adenopathy Neck: no JVD Endocrine:  No thryomegaly Vascular: No carotid bruits; 1+ radial pulses bilaterally Cardiac: Tachycardic and irregularly irregular with 1/6 systolic murmur. Lungs: Diminished breath sounds at both lung bases.  No wheezes or crackles. Abd: soft, nontender, no hepatomegaly  Ext: no edema Musculoskeletal:  No deformities, BUE and BLE strength normal and equal Skin: warm and dry  Neuro:  CNs 2-12 intact, no focal abnormalities noted Psych:  Normal affect   EKG:  The EKG obtained today at 11:37 AM was personally reviewed and demonstrates: Atrial fibrillation with rapid ventricular response, left axis deviation, low voltage, anterolateral infarct and nonspecific ST changes with less than 1 mm of ST elevation in V5 and V6.  Relevant CV Studies: None  Laboratory Data:  High Sensitivity Troponin:   Recent Labs  Lab 02/12/20 1120  TROPONINIHS 24,772*     Chemistry Recent Labs  Lab 02/12/20 1120  NA 138  K 4.2  CL 104  CO2 25  GLUCOSE 122*  BUN 28*  CREATININE 1.19*  CALCIUM 9.0  GFRNONAA 48*  ANIONGAP 9    No results for input(s): PROT, ALBUMIN, AST, ALT, ALKPHOS, BILITOT in the last 168 hours. Hematology Recent Labs  Lab 02/12/20 1120  WBC 12.4*  RBC 5.17*  HGB 12.5  HCT 41.9  MCV 81.0  MCH 24.2*  MCHC 29.8*  RDW 16.3*  PLT 317   BNPNo results for input(s): BNP, PROBNP in  the last 168 hours.  DDimer No results for input(s): DDIMER in  the last 168 hours.   Radiology/Studies:  DG Chest 2 View  Result Date: 02/12/2020 CLINICAL DATA:  Shortness of breath and chest pain. EXAM: CHEST - 2 VIEW COMPARISON:  06/09/2015 FINDINGS: Lungs are hyperexpanded. The lungs are clear without focal pneumonia, edema, pneumothorax or pleural effusion. Interstitial markings are diffusely coarsened with chronic features. Cardiopericardial silhouette is at upper limits of normal for size. Telemetry leads overlie the chest. IMPRESSION: Hyperexpansion without acute cardiopulmonary findings. Electronically Signed   By: Misty Stanley M.D.   On: 02/12/2020 12:20     Assessment and Plan:   Late presenting anterolateral STEMI: Patient reports severe chest pain lasting two hours 4 days ago and now presents with predominantly worsening shortness of breath most suggestive of heart failure.  She is without chest pain today.  EKG shows evidence of anterolateral infarct, new from her prior tracing in 08/2019.  She continues to have subtle ST elevation in V5 and V6, though this does not meet STEMI criteria.  We discussed emergent cardiac catheterization but given that her event likely happened several days ago, we have agreed to defer catheterization in favor of further work-up.  Specifically, we should make sure that her renal function and hemoglobin are appropriate for catheterization.  She has also been taking anticoagulation, with last dose of rivaroxaban yesterday morning.  This should be held in favor of a heparin infusion so that catheterization can be pursued during this admission.  As Ms. Churchman follows with Dr. Nehemiah Massed, I will defer ongoing management to [facility] clinic cardiology.  I have spoken with Drs. Nehemiah Massed and Dr. Clayborn Bigness regarding the case.  I will defer ongoing management to them.  Chronic atrial fibrillation: Ventricular rates mildly elevated.  Consider escalation of beta-blocker therapy.  Transition anticoagulation from rivaroxaban to heparin in  case invasive procedures are needed during this hospitalization.  Risk Assessment/Risk Scores:   For questions or updates, please contact [place] Please consult [email or web address] for contact info under [facility].  Signed, Nelva Bush, MD  02/12/2020 12:24 PM

## 2020-02-12 NOTE — ED Notes (Signed)
Family at bedside. Updated on plan of care.

## 2020-02-12 NOTE — ED Triage Notes (Signed)
Pt comes with c/o SOB and CP that started on Thursday. Pt states she was sent here by her PCP for evaluation based on her EKG.   Pt states no current CP. Pt states some SOB.

## 2020-02-12 NOTE — H&P (Signed)
History and Physical    Kayla Maxwell VOH:[id number] DOB: 08-Apr-1945 DOA: 02/12/2020  PCP: Mar Daring, PA-C  Patient coming from: Home   Chief Complaint:  Chief Complaint  Patient presents with  . Chest Pain     HPI:    75 year old female with past medical history of coronary artery disease, previous CVA, atrial fibrillation on Xarelto, gastroesophageal reflux disease, hypertension, hyperlipidemia who presents to [facility] emergency department with complaints of weakness.  Patient explains that approximately 5 days ago at 1:30 AM she was sitting on her couch at home when she suddenly began to experience chest discomfort.  Patient describes chest discomfort as midsternal in location, severe in intensity, pressure-like in quality and associated with shortness of breath.  This discomfort had no alleviating factors.  Discomfort lasted for approximately 3 hours before slowly improving.  The following day, patient states that her chest discomfort had resolved but she noticed that she was feeling extremely weak and short of breath.  The shortness of breath and weakness was severe and worse with any exertion whatsoever.  She denies fevers, sick contacts, recent travel, dysuria, vomiting, cough or confirmed contact with COVID-19 infection.    The symptoms continued to persist for several days until the patient eventually presented to [facility] emergency department for evaluation.  Upon evaluation in the emergency department initial EKG revealed ST elevation in the lateral leads concerning for STEMI.  Troponin found to be 24,772.  Case was discussed with Dr. Saunders Revel who promptly evaluated the patient.  It is felt that the patient likely did experience an acute MI at the time of the onset of her chest pain symptoms.  They recommended hospitalization on the medicine service for medical management for now with the plan to eventually undergo cardiac catheterization.  Dr. Clayborn Bigness with cardiology was notified  by the ER provider who stated they will follow the patient going forward in consultation.  The hospitalist group was then called to assess the patient for mission to the hospital.  Review of Systems:   Review of Systems  Constitutional: Positive for malaise/fatigue.  Cardiovascular: Positive for chest pain.  Neurological: Positive for weakness.    Past Medical History:  Diagnosis Date  . Acid reflux    takes Nexium daily  . Allergy    takes Singulair daily as needed  . Anemia   . Atrial fibrillation ([place])   . Cancer ([place]) AGE 35   CERVICAL cancer with removal  . Chronic back pain    stenosis  . Complication of anesthesia   . Constipation    takes Colace daily as needed  . Coronary artery disease   . Difficult intubation   . Dysrhythmia    atrial fibrillation dx 04/2014  . Headache    sinus   . History of blood transfusion   . History of shingles   . Hyperlipidemia    takes Pravastatin daily-started taking this 6 months ago  . Hypertension   . Lumbar surgical wound fluid collection   . Sleep apnea    CPAP  . Stroke ([place])    TIA'S X 2 --- one last yr and one this yr...lost her memory over 4-6 hrs.  . Stroke ([place]) 06-04-15   left with right side weakness  . TIA (transient ischemic attack)    showed up on an MRI but she never knew it  . Urinary frequency     Past Surgical History:  Procedure Laterality Date  . ABDOMINAL HYSTERECTOMY    .  APPENDECTOMY  1963  . BACK SURGERY  08/19/2014   Dr. Hal Neer ([place] [facility])  . BREAST SURGERY  1984   augementation  . [place]  . CHOLECYSTECTOMY  2014   Dr Jamal Collin  . COLONOSCOPY    . ESOPHAGOGASTRODUODENOSCOPY    . HAMMER TOE SURGERY Bilateral   . LOWER EXTREMITY ANGIOGRAPHY Left 01/23/2019   Procedure: LOWER EXTREMITY ANGIOGRAPHY;  Surgeon: Katha Cabal, MD;  Location: [place] CV LAB;  Service: Cardiovascular;  Laterality: Left;  . LOWER EXTREMITY ANGIOGRAPHY Left 02/20/2019   Procedure: LOWER  EXTREMITY ANGIOGRAPHY;  Surgeon: Katha Cabal, MD;  Location: [place] CV LAB;  Service: Cardiovascular;  Laterality: Left;  . LUMBAR LAMINECTOMY WITH COFLEX 1 LEVEL Bilateral 07/04/2014   Procedure: Laminectomy and Foraminotomy - bilateral - Lumbar Four-Five with coflex ;  Surgeon: Karie Chimera, MD;  Location: [place] NEURO ORS;  Service: Neurosurgery;  Laterality: Bilateral;  . LUMBAR WOUND DEBRIDEMENT N/A 08/19/2014   Procedure: Exploration of Lumbar wound;  Surgeon: Karie Chimera, MD;  Location: [place] NEURO ORS;  Service: Neurosurgery;  Laterality: N/A;  . PERIPHERAL VASCULAR CATHETERIZATION Left 08/12/2015   Procedure: Lower Extremity Angiography;  Surgeon: Katha Cabal, MD;  Location: [place] CV LAB;  Service: Cardiovascular;  Laterality: Left;  . TONSILLECTOMY    . VENTRAL HERNIA REPAIR N/A 05/26/2015   Procedure: HERNIA REPAIR VENTRAL ADULT;  Surgeon: Christene Lye, MD;  Location: [facility] ORS;  Service: General;  Laterality: N/A;     reports that she quit smoking about 5 years ago. She has a 47.00 pack-year smoking history. She quit smokeless tobacco use about 5 years ago. She reports that she does not drink alcohol and does not use drugs.  Allergies  Allergen Reactions  . Codeine Other (See Comments)    Makes her feel "crazy"  . Penicillins Swelling    Did it involve swelling of the face/tongue/throat, SOB, or low BP? Unknown Did it involve sudden or severe rash/hives, skin peeling, or any reaction on the inside of your mouth or nose? No Did you need to seek medical attention at a hospital or doctor's office? Unknown When did it last happen?Childhood allergy If all above answers are "NO", may proceed with cephalosporin use.   . Adhesive [Tape] Hives    (Latex)  . Sulfa Antibiotics Nausea Only and Rash    Family History  Problem Relation Age of Onset  . Alzheimer's disease Mother   . Heart attack Father   . Pancreatic cancer Sister   . Healthy Brother       Prior to Admission medications   Medication Sig Start Date End Date Taking? Authorizing Provider  aspirin EC 81 MG EC tablet Take 1 tablet (81 mg total) by mouth daily. 06/08/15  Yes Sainani, Belia Heman, MD  FLUoxetine (PROZAC) 20 MG capsule TAKE 1 CAPSULE BY MOUTH EVERY DAY Patient taking differently: Take 20 mg by mouth daily. 11/28/19  Yes Mar Daring, PA-C  HYDROcodone-acetaminophen (NORCO) 5-325 MG tablet Take 1-2 tablets by mouth every 8 (eight) hours as needed for moderate pain or severe pain. 01/29/20  Yes Mar Daring, PA-C  lisinopril (ZESTRIL) 2.5 MG tablet TAKE 1 TABLET BY MOUTH EVERY DAY Patient taking differently: Take 2.5 mg by mouth daily. 11/28/19  Yes Mar Daring, PA-C  metoprolol tartrate (LOPRESSOR) 25 MG tablet TAKE 2 TABLETS BY MOUTH TWICE A DAY Patient taking differently: Take 50 mg by mouth 2 (two) times daily. 08/14/18  Yes Burnette,  Clearnce Sorrel, PA-C  omeprazole (PRILOSEC) 40 MG capsule TAKE 1 CAPSULE BY MOUTH EVERY DAY Patient taking differently: Take 40 mg by mouth daily. 09/02/19  Yes Mar Daring, PA-C  rivaroxaban (XARELTO) 20 MG TABS tablet Take 20 mg by mouth daily with supper.  08/27/14  Yes [provider]  albuterol (PROVENTIL HFA;VENTOLIN HFA) 108 (90 Base) MCG/ACT inhaler Inhale 2 puffs into the lungs every 6 (six) hours as needed for wheezing or shortness of breath. 06/09/16   Mar Daring, PA-C  gabapentin (NEURONTIN) 300 MG capsule TAKE 1 CAPSULE BY MOUTH EVERYDAY AT BEDTIME Patient taking differently: Take 300 mg by mouth at bedtime. 01/31/20   Kris Hartmann, NP  lovastatin (MEVACOR) 40 MG tablet Take 1 tablet (40 mg total) by mouth at bedtime. 08/24/19   Mar Daring, PA-C  ondansetron (ZOFRAN ODT) 4 MG disintegrating tablet Take 1 tablet (4 mg total) by mouth every 8 (eight) hours as needed. 08/07/19   Lavonia Drafts, MD  ondansetron (ZOFRAN) 4 MG tablet Take 1 tablet (4 mg total) by mouth every 8  (eight) hours as needed for nausea or vomiting. Patient not taking: No sig reported 08/02/19   Mar Daring, [place]    Physical Exam: Vitals:   02/12/20 1230 02/12/20 1530 02/12/20 1631 02/12/20 1647  BP: 95/62 115/61 (!) 104/52   Pulse: 94 (!) 116 71   Resp: 18 (!) 24 20   Temp:   99.5 F (37.5 C)   TempSrc:   Oral   SpO2: 97% 97% 99%   Weight:    80.6 kg  Height:    5\' 5"  (1.651 m)    Constitutional: Acute alert and oriented x3, no associated distress.   Skin: no rashes, no lesions, good skin turgor noted. Eyes: Pupils are equally reactive to light.  No evidence of scleral icterus or conjunctival pallor.  ENMT: Moist mucous membranes noted.  Posterior pharynx clear of any exudate or lesions.   Neck: normal, supple, no masses, no thyromegaly.  No evidence of jugular venous distension.   Respiratory: clear to auscultation bilaterally, no wheezing, no crackles. Normal respiratory effort. No accessory muscle use.  Cardiovascular: Regular rate and rhythm, no murmurs / rubs / gallops. No extremity edema. 2+ pedal pulses. No carotid bruits.  Chest:   Nontender without crepitus or deformity.   Back:   Nontender without crepitus or deformity. Abdomen: Abdomen is soft and nontender.  No evidence of intra-abdominal masses.  Positive bowel sounds noted in all quadrants.   Musculoskeletal: No joint deformity upper and lower extremities. Good ROM, no contractures. Normal muscle tone.  Neurologic: CN 2-12 grossly intact. Sensation intact.  Patient moving all 4 extremities spontaneously.  Patient is following all commands.  Patient is responsive to verbal stimuli.   Psychiatric: Patient exhibits normal mood with appropriate affect.  Patient seems to possess insight as to their current situation.     Labs on Admission: I have personally reviewed following labs and imaging studies -   CBC: Recent Labs  Lab 02/12/20 1120  WBC 12.4*  HGB 12.5  HCT 41.9  MCV 81.0  PLT 299   Basic  Metabolic Panel: Recent Labs  Lab 02/12/20 1120  NA 138  K 4.2  CL 104  CO2 25  GLUCOSE 122*  BUN 28*  CREATININE 1.19*  CALCIUM 9.0   GFR: Estimated Creatinine Clearance: 43.5 mL/min (A) (by C-G formula based on SCr of 1.19 mg/dL (H)). Liver Function Tests: No results for input(s):  AST, ALT, ALKPHOS, BILITOT, PROT, ALBUMIN in the last 168 hours. No results for input(s): LIPASE, AMYLASE in the last 168 hours. No results for input(s): AMMONIA in the last 168 hours. Coagulation Profile: Recent Labs  Lab 02/12/20 1136  INR 1.3*   Cardiac Enzymes: No results for input(s): CKTOTAL, CKMB, CKMBINDEX, TROPONINI in the last 168 hours. BNP (last 3 results) No results for input(s): PROBNP in the last 8760 hours. HbA1C: No results for input(s): HGBA1C in the last 72 hours. CBG: No results for input(s): GLUCAP in the last 168 hours. Lipid Profile: No results for input(s): CHOL, HDL, LDLCALC, TRIG, CHOLHDL, LDLDIRECT in the last 72 hours. Thyroid Function Tests: No results for input(s): TSH, T4TOTAL, FREET4, T3FREE, THYROIDAB in the last 72 hours. Anemia Panel: No results for input(s): VITAMINB12, FOLATE, FERRITIN, TIBC, IRON, RETICCTPCT in the last 72 hours. Urine analysis:    Component Value Date/Time   COLORURINE AMBER (A) 08/07/2019 1604   APPEARANCEUR CLOUDY (A) 08/07/2019 1604   APPEARANCEUR Turbid (A) 10/29/2014 0000   LABSPEC 1.028 08/07/2019 1604   LABSPEC 1.012 11/09/2013 1155   PHURINE 5.0 08/07/2019 1604   GLUCOSEU NEGATIVE 08/07/2019 1604   GLUCOSEU Negative 11/09/2013 1155   HGBUR NEGATIVE 08/07/2019 1604   BILIRUBINUR NEGATIVE 08/07/2019 1604   BILIRUBINUR neg 10/29/2014 1103   BILIRUBINUR Negative 10/29/2014 0000   BILIRUBINUR Negative 11/09/2013 1155   KETONESUR 5 (A) 08/07/2019 1604   PROTEINUR 100 (A) 08/07/2019 1604   UROBILINOGEN 0.2 10/29/2014 1103   NITRITE NEGATIVE 08/07/2019 1604   LEUKOCYTESUR NEGATIVE 08/07/2019 1604   LEUKOCYTESUR Negative  11/09/2013 1155    Radiological Exams on Admission - Personally Reviewed: DG Chest 2 View  Result Date: 02/12/2020 CLINICAL DATA:  Shortness of breath and chest pain. EXAM: CHEST - 2 VIEW COMPARISON:  06/09/2015 FINDINGS: Lungs are hyperexpanded. The lungs are clear without focal pneumonia, edema, pneumothorax or pleural effusion. Interstitial markings are diffusely coarsened with chronic features. Cardiopericardial silhouette is at upper limits of normal for size. Telemetry leads overlie the chest. IMPRESSION: Hyperexpansion without acute cardiopulmonary findings. Electronically Signed   By: Misty Stanley M.D.   On: 02/12/2020 12:20    EKG: Personally reviewed.  Rhythm is rapid atrial fibrillation with heart rate of 109 bpm.  Notable ST segment elevation in the lateral leads with some additional ST segment elevation noted in the inferior leads . Assessment/Plan Principal Problem:   STEMI (ST elevation myocardial infarction) [facility])   Patient presenting several days after an episode of severe typical sounding chest pain during which the patient did not seek medical attention  On arrival here, patient exhibits EKG evidence of a STEMI with markedly elevated initial troponin of 24,772  Patient has already been evaluated by Dr. Saunders Revel with cardiology (with Dr. Clayborn Bigness to begin following the patient tomorrow).   Recommendations have been for the patient to be placed on a heparin infusion.  Antiplatelet therapy initiated  Continue metoprolol  Continue statin therapy  Admitting patient to progressive unit for close monitoring on telemetry  Cardiac catheterization is being deferred for now  Echocardiogram ordered  Active Problems:   Gastro-esophageal reflux disease without esophagitis   Continue daily PPI    Mixed hyperlipidemia   Continuing statin therapy as mentioned above  Lipid panel ordered    Chronic atrial fibrillation ([place])   Holding home regimen of Xarelto for now in  lieu of heparin infusion per cardiology recommendations  Currently receiving metoprolol 50 mg twice daily which will be titrated to achieve  rate control  Monitoring patient on telemetry    Benign essential HTN  Continue home regimen of antihypertensive therapy  As needed intravenous antihypertensives for markedly elevated blood pressure   Code Status:  Full code Family Communication: Sister at bedside who has been updated on plan of care  Status is: Inpatient  Remains inpatient appropriate because:Ongoing diagnostic testing needed not appropriate for outpatient work up, Unsafe d/c plan, IV treatments appropriate due to intensity of illness or inability to take PO and Inpatient level of care appropriate due to severity of illness   Dispo: The patient is from: Home              Anticipated d/c is to: Home              Anticipated d/c date is: > 3 days              Patient currently is not medically stable to d/c.   Difficult to place patient No        Vernelle Emerald MD [facility] Pager [phone]  If 7PM-7AM, please contact night-coverage [email or web address] Use universal [place] password for that web site. If you do not have the password, please call the hospital operator.  02/12/2020, 11:37 PM

## 2020-02-12 NOTE — Consult Note (Addendum)
ANTICOAGULATION CONSULT NOTE - Initial Consult  Pharmacy Consult for Heparin Infusion Indication: atrial fibrillation and ACS  Allergies  Allergen Reactions  . Codeine Other (See Comments)    Makes her feel "crazy"  . Penicillins Swelling    Did it involve swelling of the face/tongue/throat, SOB, or low BP? Unknown Did it involve sudden or severe rash/hives, skin peeling, or any reaction on the inside of your mouth or nose? No Did you need to seek medical attention at a hospital or doctor's office? Unknown When did it last happen?Childhood allergy If all above answers are "NO", may proceed with cephalosporin use.   . Adhesive [Tape] Hives  . Sulfa Antibiotics Nausea Only and Rash    Patient Measurements: Height: 5\' 5"  (165.1 cm) Weight: 77.1 kg (170 lb) IBW/kg (Calculated) : 57 Heparin Dosing Weight: 77.1 kg  Vital Signs: Temp: 98.1 F (36.7 C) (02/08 1120) BP: 109/55 (02/08 1150) Pulse Rate: 107 (02/08 1150)  Labs: Recent Labs    02/12/20 1120  HGB 12.5  HCT 41.9  PLT 317  CREATININE 1.19*  TROPONINIHS 24,772*    Estimated Creatinine Clearance: 42.6 mL/min (A) (by C-G formula based on SCr of 1.19 mg/dL (H)).   Medical History: Past Medical History:  Diagnosis Date  . Acid reflux    takes Nexium daily  . Allergy    takes Singulair daily as needed  . Anemia   . Atrial fibrillation ([place])   . Cancer ([place]) AGE 65   CERVICAL cancer with removal  . Chronic back pain    stenosis  . Complication of anesthesia   . Constipation    takes Colace daily as needed  . Coronary artery disease   . Difficult intubation   . Dysrhythmia    atrial fibrillation dx 04/2014  . Headache    sinus   . History of blood transfusion   . History of shingles   . Hyperlipidemia    takes Pravastatin daily-started taking this 6 months ago  . Hypertension   . Lumbar surgical wound fluid collection   . Sleep apnea    CPAP  . Stroke ([place])    TIA'S X 2 --- one last yr and  one this yr...lost her memory over 4-6 hrs.  . Stroke ([place]) 06-04-15   left with right side weakness  . TIA (transient ischemic attack)    showed up on an MRI but she never knew it  . Urinary frequency      Assessment: Pt is a 75 y.o female admitted to ED for NSTEMI/ACS with a PMH of A.fib. Currently takes Xarelto at home (last reported dose 2/7 AM). EKG reported A.fib with rapid ventricular response, left axis deviation, low voltage, anterolateral infarct and nonspecific infarct and nonspecific ST changes. Per cards, catherization deferred, pending further workup. Plan is to transition from Xarelto to heparin in case invasive procedures are needed in hospitalization. Pharmacy was consulted for Heparin dosing and monitoring. Troponin HS: 24,772>>21,875  Goal of Therapy:  Heparin level 0.3-0.7 units/ml Monitor platelets by anticoagulation protocol: Yes   Plan:  Give 4000 units bolus x 1 Start heparin infusion at 1050 units/hr Baseline HL elevated due to Xarelto, will need to adjust heparin infusion based on aPTT level until heparin and aPTT levels correlate  Check aptt level in 8 hours and HL daily while on heparin Continue to monitor H&H and platelets  Chrystie Nose, PharmD Student 02/12/2020,12:35 PM

## 2020-02-12 NOTE — ED Notes (Signed)
Pt undressed and placed in gown. Pads placed on pt. MD Siadecki at bedside

## 2020-02-12 NOTE — ED Notes (Signed)
CODE STEMI called to [facility] spoke with [facility]

## 2020-02-12 NOTE — ED Notes (Signed)
RN Reed at bedside

## 2020-02-12 NOTE — ED Notes (Signed)
324 ASA given per cards MD

## 2020-02-12 NOTE — ED Notes (Signed)
Walk down covid swab and advised needed it done stat

## 2020-02-12 NOTE — H&P (Incomplete)
History and Physical    Kayla Maxwell GHW:[id number] DOB: [birth year] DOA: 02/12/2020  PCP: Mar Daring, PA-C  Patient coming from: Home   Chief Complaint:  Chief Complaint  Patient presents with  . Chest Pain     HPI:    74 year old female with past medical history of coronary artery disease, previous CVA, atrial fibrillation on Xarelto, gastroesophageal reflux disease, hypertension, hyperlipidemia who presents to [facility] emergency department with complaints of weakness.  Patient explains that approximately 5 days ago at 1:30 AM she was sitting on her couch at home when she suddenly began to experience chest discomfort.  Patient describes chest discomfort as midsternal in location, severe in intensity, pressure-like in quality and associated with shortness of breath.  This discomfort had no alleviating factors.  Discomfort lasted for approximately 3 hours before slowly improving.  The following day, patient states that her chest discomfort had resolved but she noticed that she was feeling extremely weak and short of breath.  The shortness of breath and weakness was severe and worse with any exertion whatsoever.  She denies fevers, sick contacts, recent travel, dysuria, vomiting, cough or confirmed contact with COVID-19 infection.    The symptoms continued to persist for several days until the patient eventually presented to [facility] emergency department for evaluation.  Upon evaluation in the emergency department initial EKG revealed ST elevation in the lateral leads concerning for STEMI.  Troponin found to be 24,772.  Case was discussed with Dr. Saunders Revel who promptly evaluated the patient.  It is felt that the patient likely did experience an acute MI at the time of the onset of her chest pain symptoms.  They recommended hospitalization on the medicine service for medical management for now with the plan to eventually undergo cardiac catheterization.  Dr. Clayborn Bigness with cardiology was notified  by the ER provider who stated they will follow the patient going forward in consultation.  The hospitalist group was then called to assess the patient for mission to the hospital.  Review of Systems:   Review of Systems  Constitutional: Positive for malaise/fatigue.  Cardiovascular: Positive for chest pain.  Neurological: Positive for weakness.    Past Medical History:  Diagnosis Date  . Acid reflux    takes Nexium daily  . Allergy    takes Singulair daily as needed  . Anemia   . Atrial fibrillation ([place])   . Cancer ([place]) AGE 32   CERVICAL cancer with removal  . Chronic back pain    stenosis  . Complication of anesthesia   . Constipation    takes Colace daily as needed  . Coronary artery disease   . Difficult intubation   . Dysrhythmia    atrial fibrillation dx 04/2014  . Headache    sinus   . History of blood transfusion   . History of shingles   . Hyperlipidemia    takes Pravastatin daily-started taking this 6 months ago  . Hypertension   . Lumbar surgical wound fluid collection   . Sleep apnea    CPAP  . Stroke ([place])    TIA'S X 2 --- one last yr and one this yr...lost her memory over 4-6 hrs.  . Stroke ([place]) 06-04-15   left with right side weakness  . TIA (transient ischemic attack)    showed up on an MRI but she never knew it  . Urinary frequency     Past Surgical History:  Procedure Laterality Date  . ABDOMINAL HYSTERECTOMY    .  APPENDECTOMY  1963  . BACK SURGERY  08/19/2014   Dr. Hal Neer ([place] [facility])  . BREAST SURGERY  1984   augementation  . [place]  . CHOLECYSTECTOMY  2014   Dr Jamal Collin  . COLONOSCOPY    . ESOPHAGOGASTRODUODENOSCOPY    . HAMMER TOE SURGERY Bilateral   . LOWER EXTREMITY ANGIOGRAPHY Left 01/23/2019   Procedure: LOWER EXTREMITY ANGIOGRAPHY;  Surgeon: Katha Cabal, MD;  Location: [place] CV LAB;  Service: Cardiovascular;  Laterality: Left;  . LOWER EXTREMITY ANGIOGRAPHY Left 02/20/2019   Procedure: LOWER  EXTREMITY ANGIOGRAPHY;  Surgeon: Katha Cabal, MD;  Location: [place] CV LAB;  Service: Cardiovascular;  Laterality: Left;  . LUMBAR LAMINECTOMY WITH COFLEX 1 LEVEL Bilateral 07/04/2014   Procedure: Laminectomy and Foraminotomy - bilateral - Lumbar Four-Five with coflex ;  Surgeon: Karie Chimera, MD;  Location: [place] NEURO ORS;  Service: Neurosurgery;  Laterality: Bilateral;  . LUMBAR WOUND DEBRIDEMENT N/A 08/19/2014   Procedure: Exploration of Lumbar wound;  Surgeon: Karie Chimera, MD;  Location: [place] NEURO ORS;  Service: Neurosurgery;  Laterality: N/A;  . PERIPHERAL VASCULAR CATHETERIZATION Left 08/12/2015   Procedure: Lower Extremity Angiography;  Surgeon: Katha Cabal, MD;  Location: [place] CV LAB;  Service: Cardiovascular;  Laterality: Left;  . TONSILLECTOMY    . VENTRAL HERNIA REPAIR N/A 05/26/2015   Procedure: HERNIA REPAIR VENTRAL ADULT;  Surgeon: Christene Lye, MD;  Location: [facility] ORS;  Service: General;  Laterality: N/A;     reports that she quit smoking about 5 years ago. She has a 47.00 pack-year smoking history. She quit smokeless tobacco use about 5 years ago. She reports that she does not drink alcohol and does not use drugs.  Allergies  Allergen Reactions  . Codeine Other (See Comments)    Makes her feel "crazy"  . Penicillins Swelling    Did it involve swelling of the face/tongue/throat, SOB, or low BP? Unknown Did it involve sudden or severe rash/hives, skin peeling, or any reaction on the inside of your mouth or nose? No Did you need to seek medical attention at a hospital or doctor's office? Unknown When did it last happen?Childhood allergy If all above answers are "NO", may proceed with cephalosporin use.   . Adhesive [Tape] Hives    (Latex)  . Sulfa Antibiotics Nausea Only and Rash    Family History  Problem Relation Age of Onset  . Alzheimer's disease Mother   . Heart attack Father   . Pancreatic cancer Sister   . Healthy Brother       Prior to Admission medications   Medication Sig Start Date End Date Taking? Authorizing Provider  aspirin EC 81 MG EC tablet Take 1 tablet (81 mg total) by mouth daily. 06/08/15  Yes Sainani, Belia Heman, MD  FLUoxetine (PROZAC) 20 MG capsule TAKE 1 CAPSULE BY MOUTH EVERY DAY Patient taking differently: Take 20 mg by mouth daily. 11/28/19  Yes Mar Daring, PA-C  HYDROcodone-acetaminophen (NORCO) 5-325 MG tablet Take 1-2 tablets by mouth every 8 (eight) hours as needed for moderate pain or severe pain. 01/29/20  Yes Mar Daring, PA-C  lisinopril (ZESTRIL) 2.5 MG tablet TAKE 1 TABLET BY MOUTH EVERY DAY Patient taking differently: Take 2.5 mg by mouth daily. 11/28/19  Yes Mar Daring, PA-C  metoprolol tartrate (LOPRESSOR) 25 MG tablet TAKE 2 TABLETS BY MOUTH TWICE A DAY Patient taking differently: Take 50 mg by mouth 2 (two) times daily. 08/14/18  Yes Burnette,  Clearnce Sorrel, PA-C  omeprazole (PRILOSEC) 40 MG capsule TAKE 1 CAPSULE BY MOUTH EVERY DAY Patient taking differently: Take 40 mg by mouth daily. 09/02/19  Yes Mar Daring, PA-C  rivaroxaban (XARELTO) 20 MG TABS tablet Take 20 mg by mouth daily with supper.  08/27/14  Yes [provider]  albuterol (PROVENTIL HFA;VENTOLIN HFA) 108 (90 Base) MCG/ACT inhaler Inhale 2 puffs into the lungs every 6 (six) hours as needed for wheezing or shortness of breath. 06/09/16   Mar Daring, PA-C  gabapentin (NEURONTIN) 300 MG capsule TAKE 1 CAPSULE BY MOUTH EVERYDAY AT BEDTIME Patient taking differently: Take 300 mg by mouth at bedtime. 01/31/20   Kris Hartmann, NP  lovastatin (MEVACOR) 40 MG tablet Take 1 tablet (40 mg total) by mouth at bedtime. 08/24/19   Mar Daring, PA-C  ondansetron (ZOFRAN ODT) 4 MG disintegrating tablet Take 1 tablet (4 mg total) by mouth every 8 (eight) hours as needed. 08/07/19   Lavonia Drafts, MD  ondansetron (ZOFRAN) 4 MG tablet Take 1 tablet (4 mg total) by mouth every 8  (eight) hours as needed for nausea or vomiting. Patient not taking: No sig reported 08/02/19   Mar Daring, [place]    Physical Exam: Vitals:   02/12/20 1230 02/12/20 1530 02/12/20 1631 02/12/20 1647  BP: 95/62 115/61 (!) 104/52   Pulse: 94 (!) 116 71   Resp: 18 (!) 24 20   Temp:   99.5 F (37.5 C)   TempSrc:   Oral   SpO2: 97% 97% 99%   Weight:    80.6 kg  Height:    5\' 5"  (1.651 m)    Constitutional: Acute alert and oriented x3, no associated distress.   Skin: no rashes, no lesions, good skin turgor noted. Eyes: Pupils are equally reactive to light.  No evidence of scleral icterus or conjunctival pallor.  ENMT: Moist mucous membranes noted.  Posterior pharynx clear of any exudate or lesions.   Neck: normal, supple, no masses, no thyromegaly.  No evidence of jugular venous distension.   Respiratory: clear to auscultation bilaterally, no wheezing, no crackles. Normal respiratory effort. No accessory muscle use.  Cardiovascular: Regular rate and rhythm, no murmurs / rubs / gallops. No extremity edema. 2+ pedal pulses. No carotid bruits.  Chest:   Nontender without crepitus or deformity.   Back:   Nontender without crepitus or deformity. Abdomen: Abdomen is soft and nontender.  No evidence of intra-abdominal masses.  Positive bowel sounds noted in all quadrants.   Musculoskeletal: No joint deformity upper and lower extremities. Good ROM, no contractures. Normal muscle tone.  Neurologic: CN 2-12 grossly intact. Sensation intact.  Patient moving all 4 extremities spontaneously.  Patient is following all commands.  Patient is responsive to verbal stimuli.   Psychiatric: Patient exhibits normal mood with appropriate affect.  Patient seems to possess insight as to their current situation.     Labs on Admission: I have personally reviewed following labs and imaging studies -   CBC: Recent Labs  Lab 02/12/20 1120  WBC 12.4*  HGB 12.5  HCT 41.9  MCV 81.0  PLT 086   Basic  Metabolic Panel: Recent Labs  Lab 02/12/20 1120  NA 138  K 4.2  CL 104  CO2 25  GLUCOSE 122*  BUN 28*  CREATININE 1.19*  CALCIUM 9.0   GFR: Estimated Creatinine Clearance: 43.5 mL/min (A) (by C-G formula based on SCr of 1.19 mg/dL (H)). Liver Function Tests: No results for input(s):  AST, ALT, ALKPHOS, BILITOT, PROT, ALBUMIN in the last 168 hours. No results for input(s): LIPASE, AMYLASE in the last 168 hours. No results for input(s): AMMONIA in the last 168 hours. Coagulation Profile: Recent Labs  Lab 02/12/20 1136  INR 1.3*   Cardiac Enzymes: No results for input(s): CKTOTAL, CKMB, CKMBINDEX, TROPONINI in the last 168 hours. BNP (last 3 results) No results for input(s): PROBNP in the last 8760 hours. HbA1C: No results for input(s): HGBA1C in the last 72 hours. CBG: No results for input(s): GLUCAP in the last 168 hours. Lipid Profile: No results for input(s): CHOL, HDL, LDLCALC, TRIG, CHOLHDL, LDLDIRECT in the last 72 hours. Thyroid Function Tests: No results for input(s): TSH, T4TOTAL, FREET4, T3FREE, THYROIDAB in the last 72 hours. Anemia Panel: No results for input(s): VITAMINB12, FOLATE, FERRITIN, TIBC, IRON, RETICCTPCT in the last 72 hours. Urine analysis:    Component Value Date/Time   COLORURINE AMBER (A) 08/07/2019 1604   APPEARANCEUR CLOUDY (A) 08/07/2019 1604   APPEARANCEUR Turbid (A) 10/29/2014 0000   LABSPEC 1.028 08/07/2019 1604   LABSPEC 1.012 11/09/2013 1155   PHURINE 5.0 08/07/2019 1604   GLUCOSEU NEGATIVE 08/07/2019 1604   GLUCOSEU Negative 11/09/2013 1155   HGBUR NEGATIVE 08/07/2019 1604   BILIRUBINUR NEGATIVE 08/07/2019 1604   BILIRUBINUR neg 10/29/2014 1103   BILIRUBINUR Negative 10/29/2014 0000   BILIRUBINUR Negative 11/09/2013 1155   KETONESUR 5 (A) 08/07/2019 1604   PROTEINUR 100 (A) 08/07/2019 1604   UROBILINOGEN 0.2 10/29/2014 1103   NITRITE NEGATIVE 08/07/2019 1604   LEUKOCYTESUR NEGATIVE 08/07/2019 1604   LEUKOCYTESUR Negative  11/09/2013 1155    Radiological Exams on Admission - Personally Reviewed: DG Chest 2 View  Result Date: 02/12/2020 CLINICAL DATA:  Shortness of breath and chest pain. EXAM: CHEST - 2 VIEW COMPARISON:  06/09/2015 FINDINGS: Lungs are hyperexpanded. The lungs are clear without focal pneumonia, edema, pneumothorax or pleural effusion. Interstitial markings are diffusely coarsened with chronic features. Cardiopericardial silhouette is at upper limits of normal for size. Telemetry leads overlie the chest. IMPRESSION: Hyperexpansion without acute cardiopulmonary findings. Electronically Signed   By: Misty Stanley M.D.   On: 02/12/2020 12:20    EKG: Personally reviewed.  Rhythm is rapid atrial fibrillation with heart rate of 109 bpm.  Notable ST segment elevation in the lateral leads with some additional ST segment elevation noted in the inferior leads . Assessment/Plan Principal Problem:   STEMI (ST elevation myocardial infarction) [facility]   Patient presenting several days after an episode of severe typical sounding chest pain during which the patient did not seek medical attention  On arrival here, patient exhibits EKG evidence of a STEMI with markedly elevated initial troponin of 24,772  Patient has already been evaluated by Dr. Saunders Revel with cardiology (with Dr. Clayborn Bigness to begin following the patient tomorrow).   Recommendations have been for the patient to be placed on a heparin infusion.  Antiplatelet therapy initiated  Continue metoprolol  Continue statin therapy  Admitting patient to progressive unit for close monitoring on telemetry  Cardiac catheterization is being deferred for now  Echocardiogram ordered  Active Problems:   Gastro-esophageal reflux disease without esophagitis   Continue daily PPI    Mixed hyperlipidemia   Continuing statin therapy as mentioned above  Lipid panel ordered    Chronic atrial fibrillation ([place])   Holding home regimen of Xarelto for now in  lieu of heparin infusion per cardiology recommendations  Currently receiving metoprolol 50 mg twice daily which will be titrated to achieve  rate control  Monitoring patient on telemetry     Bilateral carotid artery stenosis   Nicotine dependence, cigarettes, uncomplicated   Benign essential HTN    ***  Code Status:  {Palliative Code status:23503} Family Communication: ***   Status is: Inpatient  {Inpatient:23812}  Dispo: The patient is from: {From:23814}              Anticipated d/c is to: {To:23815}              Anticipated d/c date is: {Days:23816}              Patient currently {Medically stable:23817}   Difficult to place patient {Yes/No:25151}        Vernelle Emerald MD [facility] Pager [phone]  If 7PM-7AM, please contact night-coverage [email or web address] Use universal [place] password for that web site. If you do not have the password, please call the hospital operator.  02/12/2020, 11:37 PM

## 2020-02-12 NOTE — ED Notes (Addendum)
Calling a code STEMI. instructions from [place]. Charge called for bed at this time. Moving patient to room 10 per [employer].

## 2020-02-13 ENCOUNTER — Other Ambulatory Visit: Payer: Self-pay

## 2020-02-13 ENCOUNTER — Encounter
Admission: EM | Disposition: A | Discharge status: Home / Self Care | Payer: Self-pay | Source: Home / Self Care | Attending: Internal Medicine

## 2020-02-13 DIAGNOSIS — I213 ST elevation (STEMI) myocardial infarction of unspecified site: Secondary | ICD-10-CM | POA: Diagnosis not present

## 2020-02-13 DIAGNOSIS — I482 Chronic atrial fibrillation, unspecified: Secondary | ICD-10-CM | POA: Diagnosis not present

## 2020-02-13 DIAGNOSIS — I10 Essential (primary) hypertension: Secondary | ICD-10-CM | POA: Diagnosis not present

## 2020-02-13 DIAGNOSIS — K219 Gastro-esophageal reflux disease without esophagitis: Secondary | ICD-10-CM | POA: Diagnosis not present

## 2020-02-13 HISTORY — PX: CORONARY STENT INTERVENTION: CATH118234

## 2020-02-13 HISTORY — PX: LEFT HEART CATH AND CORONARY ANGIOGRAPHY: CATH118249

## 2020-02-13 LAB — COMPREHENSIVE METABOLIC PANEL
ALT: 40 U/L (ref 0–44)
AST: 37 U/L (ref 15–41)
Albumin: 2.9 g/dL — ABNORMAL LOW (ref 3.5–5.0)
Alkaline Phosphatase: 94 U/L (ref 38–126)
Anion gap: 11 (ref 5–15)
BUN: 25 mg/dL — ABNORMAL HIGH (ref 8–23)
CO2: 22 mmol/L (ref 22–32)
Calcium: 8.3 mg/dL — ABNORMAL LOW (ref 8.9–10.3)
Chloride: 104 mmol/L (ref 98–111)
Creatinine, Ser: 1.14 mg/dL — ABNORMAL HIGH (ref 0.44–1.00)
GFR, Estimated: 51 mL/min — ABNORMAL LOW (ref >60)
Glucose, Bld: 147 mg/dL — ABNORMAL HIGH (ref 70–99)
Potassium: 3.7 mmol/L (ref 3.5–5.1)
Sodium: 137 mmol/L (ref 135–145)
Total Bilirubin: 0.8 mg/dL (ref 0.3–1.2)
Total Protein: 5.6 g/dL — ABNORMAL LOW (ref 6.5–8.1)

## 2020-02-13 LAB — CBC WITH DIFFERENTIAL/PLATELET
Abs Immature Granulocytes: 0.04 10*3/uL (ref 0.00–0.07)
Basophils Absolute: 0.1 10*3/uL (ref 0.0–0.1)
Basophils Relative: 1 %
Eosinophils Absolute: 0.1 10*3/uL (ref 0.0–0.5)
Eosinophils Relative: 1 %
HCT: 37.6 % (ref 36.0–46.0)
Hemoglobin: 11.1 g/dL — ABNORMAL LOW (ref 12.0–15.0)
Immature Granulocytes: 0 %
Lymphocytes Relative: 18 %
Lymphs Abs: 1.7 10*3/uL (ref 0.7–4.0)
MCH: 24 pg — ABNORMAL LOW (ref 26.0–34.0)
MCHC: 29.5 g/dL — ABNORMAL LOW (ref 30.0–36.0)
MCV: 81.4 fL (ref 80.0–100.0)
Monocytes Absolute: 0.5 10*3/uL (ref 0.1–1.0)
Monocytes Relative: 6 %
Neutro Abs: 6.7 10*3/uL (ref 1.7–7.7)
Neutrophils Relative %: 74 %
Platelets: 264 10*3/uL (ref 150–400)
RBC: 4.62 MIL/uL (ref 3.87–5.11)
RDW: 16.8 % — ABNORMAL HIGH (ref 11.5–15.5)
WBC: 9.1 10*3/uL (ref 4.0–10.5)
nRBC: 0 % (ref 0.0–0.2)

## 2020-02-13 LAB — MAGNESIUM: Magnesium: 1.9 mg/dL (ref 1.7–2.4)

## 2020-02-13 LAB — LIPID PANEL
Cholesterol: 106 mg/dL (ref 0–200)
HDL: 36 mg/dL — ABNORMAL LOW (ref >40)
LDL Cholesterol: 53 mg/dL (ref 0–99)
Total CHOL/HDL Ratio: 2.9 RATIO
Triglycerides: 87 mg/dL (ref <150)
VLDL: 17 mg/dL (ref 0–40)

## 2020-02-13 LAB — PROTIME-INR
INR: 1.2 (ref 0.8–1.2)
Prothrombin Time: 15.1 seconds (ref 11.4–15.2)

## 2020-02-13 LAB — ECHOCARDIOGRAM COMPLETE
Height: 65 in
S' Lateral: 3.13 cm
Weight: 2844.8 oz

## 2020-02-13 LAB — APTT: aPTT: 90 seconds — ABNORMAL HIGH (ref 24–36)

## 2020-02-13 LAB — HEPARIN LEVEL (UNFRACTIONATED): Heparin Unfractionated: 0.78 IU/mL — ABNORMAL HIGH (ref 0.30–0.70)

## 2020-02-13 SURGERY — LEFT HEART CATH AND CORONARY ANGIOGRAPHY
Anesthesia: Moderate Sedation

## 2020-02-13 MED ORDER — VERAPAMIL HCL 2.5 MG/ML IV SOLN
INTRAVENOUS | Status: AC
  Filled 2020-02-13: qty 2

## 2020-02-13 MED ORDER — HEPARIN (PORCINE) IN NACL 1000-0.9 UT/500ML-% IV SOLN
INTRAVENOUS | Status: AC
  Filled 2020-02-13: qty 1000

## 2020-02-13 MED ORDER — IOHEXOL 300 MG/ML  SOLN
INTRAMUSCULAR | Status: DC | PRN
  Administered 2020-02-13: 280 mL

## 2020-02-13 MED ORDER — MIDAZOLAM HCL 2 MG/2ML IJ SOLN
INTRAMUSCULAR | Status: DC | PRN
  Administered 2020-02-13 (×2): 1 mg via INTRAVENOUS

## 2020-02-13 MED ORDER — ACETAMINOPHEN 325 MG PO TABS
650.0000 mg | ORAL_TABLET | ORAL | Status: DC | PRN
  Filled 2020-02-13: qty 2

## 2020-02-13 MED ORDER — SODIUM CHLORIDE 0.9% FLUSH: 3.0000 mL | INTRAVENOUS | Status: DC | PRN

## 2020-02-13 MED ORDER — SODIUM CHLORIDE 0.9 % WEIGHT BASED INFUSION: 1.0000 mL/kg/h | INTRAVENOUS | Status: DC

## 2020-02-13 MED ORDER — LABETALOL HCL 5 MG/ML IV SOLN: 10.0000 mg | INTRAVENOUS | Status: AC | PRN

## 2020-02-13 MED ORDER — TIROFIBAN HCL IN NACL 5-0.9 MG/100ML-% IV SOLN
INTRAVENOUS | Status: AC | PRN
  Administered 2020-02-13: 0.075 ug/kg/min via INTRAVENOUS

## 2020-02-13 MED ORDER — SODIUM CHLORIDE 0.9 % IV SOLN: 250.0000 mL | INTRAVENOUS | Status: DC | PRN

## 2020-02-13 MED ORDER — HEPARIN (PORCINE) IN NACL 2000-0.9 UNIT/L-% IV SOLN
INTRAVENOUS | Status: DC | PRN
  Administered 2020-02-13: 1000 mL

## 2020-02-13 MED ORDER — MIDAZOLAM HCL 2 MG/2ML IJ SOLN
INTRAMUSCULAR | Status: AC
  Filled 2020-02-13: qty 2

## 2020-02-13 MED ORDER — HEPARIN SODIUM (PORCINE) 1000 UNIT/ML IJ SOLN
INTRAMUSCULAR | Status: DC | PRN
  Administered 2020-02-13: 4000 [IU] via INTRAVENOUS
  Administered 2020-02-13 (×2): 3000 [IU] via INTRAVENOUS

## 2020-02-13 MED ORDER — VERAPAMIL HCL 2.5 MG/ML IV SOLN
INTRAVENOUS | Status: DC | PRN
  Administered 2020-02-13 (×2): 2.5 mg via INTRAVENOUS

## 2020-02-13 MED ORDER — LIDOCAINE HCL (PF) 1 % IJ SOLN
INTRAMUSCULAR | Status: AC
  Filled 2020-02-13: qty 30

## 2020-02-13 MED ORDER — SODIUM CHLORIDE 0.9 % WEIGHT BASED INFUSION
3.0000 mL/kg/h | INTRAVENOUS | Status: DC
  Administered 2020-02-13: 3 mL/kg/h via INTRAVENOUS

## 2020-02-13 MED ORDER — ASPIRIN 81 MG PO CHEW: 81.0000 mg | CHEWABLE_TABLET | ORAL | Status: DC

## 2020-02-13 MED ORDER — TICAGRELOR 90 MG PO TABS
ORAL_TABLET | ORAL | Status: DC | PRN
  Administered 2020-02-13: 180 mg via ORAL

## 2020-02-13 MED ORDER — FENTANYL CITRATE (PF) 100 MCG/2ML IJ SOLN
INTRAMUSCULAR | Status: DC | PRN
  Administered 2020-02-13 (×2): 25 ug via INTRAVENOUS

## 2020-02-13 MED ORDER — TICAGRELOR 90 MG PO TABS
90.0000 mg | ORAL_TABLET | Freq: Two times a day (BID) | ORAL | Status: DC
  Administered 2020-02-13 – 2020-02-15 (×4): 90 mg via ORAL
  Filled 2020-02-13 (×4): qty 1

## 2020-02-13 MED ORDER — HEPARIN SODIUM (PORCINE) 1000 UNIT/ML IJ SOLN
INTRAMUSCULAR | Status: AC
  Filled 2020-02-13: qty 1

## 2020-02-13 MED ORDER — TIROFIBAN HCL IN NACL 5-0.9 MG/100ML-% IV SOLN
0.0750 ug/kg/min | INTRAVENOUS | Status: AC
  Administered 2020-02-13: 0.075 ug/kg/min via INTRAVENOUS
  Filled 2020-02-13 (×2): qty 100

## 2020-02-13 MED ORDER — SODIUM CHLORIDE 0.9 % IV SOLN: INTRAVENOUS | Status: DC

## 2020-02-13 MED ORDER — TICAGRELOR 90 MG PO TABS
ORAL_TABLET | ORAL | Status: AC
  Filled 2020-02-13: qty 2

## 2020-02-13 MED ORDER — NITROGLYCERIN 1 MG/10 ML FOR IR/CATH LAB
INTRA_ARTERIAL | Status: DC | PRN
  Administered 2020-02-13: 100 ug

## 2020-02-13 MED ORDER — FENTANYL CITRATE (PF) 100 MCG/2ML IJ SOLN
INTRAMUSCULAR | Status: AC
  Filled 2020-02-13: qty 2

## 2020-02-13 MED ORDER — SODIUM CHLORIDE 0.9 % IV SOLN
250.0000 mL | INTRAVENOUS | Status: DC | PRN
Start: 2020-02-13 — End: 2020-02-15

## 2020-02-13 MED ORDER — HYDRALAZINE HCL 20 MG/ML IJ SOLN: 10.0000 mg | INTRAMUSCULAR | Status: AC | PRN

## 2020-02-13 MED ORDER — NITROGLYCERIN 1 MG/10 ML FOR IR/CATH LAB
INTRA_ARTERIAL | Status: AC
  Filled 2020-02-13: qty 10

## 2020-02-13 MED ORDER — TIROFIBAN (AGGRASTAT) BOLUS VIA INFUSION
INTRAVENOUS | Status: DC | PRN
  Administered 2020-02-13: 2025 ug via INTRAVENOUS

## 2020-02-13 MED ORDER — MELATONIN 5 MG PO TABS
5.0000 mg | ORAL_TABLET | Freq: Every day | ORAL | Status: DC
  Administered 2020-02-13 – 2020-02-14 (×3): 5 mg via ORAL
  Filled 2020-02-13 (×3): qty 1

## 2020-02-13 MED ORDER — ASPIRIN 81 MG PO CHEW
CHEWABLE_TABLET | ORAL | Status: AC
  Filled 2020-02-13: qty 3

## 2020-02-13 MED ORDER — SODIUM CHLORIDE 0.9% FLUSH
3.0000 mL | Freq: Two times a day (BID) | INTRAVENOUS | Status: DC
  Administered 2020-02-13: 3 mL via INTRAVENOUS

## 2020-02-13 MED ORDER — SODIUM CHLORIDE 0.9% FLUSH
3.0000 mL | INTRAVENOUS | Status: DC | PRN
Start: 2020-02-13 — End: 2020-02-15

## 2020-02-13 MED ORDER — ASPIRIN 81 MG PO CHEW
CHEWABLE_TABLET | ORAL | Status: DC | PRN
  Administered 2020-02-13: 243 mg via ORAL

## 2020-02-13 MED ORDER — LIDOCAINE HCL (PF) 1 % IJ SOLN
INTRAMUSCULAR | Status: DC | PRN
  Administered 2020-02-13: 2 mL

## 2020-02-13 MED ORDER — BIVALIRUDIN BOLUS VIA INFUSION - CUPID: INTRAVENOUS | Status: DC | PRN

## 2020-02-13 MED ORDER — ASPIRIN 81 MG PO CHEW
81.0000 mg | CHEWABLE_TABLET | Freq: Every day | ORAL | Status: DC
  Administered 2020-02-14 – 2020-02-15 (×2): 81 mg via ORAL
  Filled 2020-02-13 (×2): qty 1

## 2020-02-13 MED ORDER — ONDANSETRON HCL 4 MG/2ML IJ SOLN: 4.0000 mg | Freq: Four times a day (QID) | INTRAMUSCULAR | Status: DC | PRN

## 2020-02-13 MED ORDER — SODIUM CHLORIDE 0.9% FLUSH
3.0000 mL | Freq: Two times a day (BID) | INTRAVENOUS | Status: DC
  Administered 2020-02-14: 3 mL via INTRAVENOUS

## 2020-02-13 MED ORDER — TIROFIBAN HCL IN NACL 5-0.9 MG/100ML-% IV SOLN
INTRAVENOUS | Status: AC
  Filled 2020-02-13: qty 100

## 2020-02-13 SURGICAL SUPPLY — 15 items
BALLN TREK RX 2.5X15 (BALLOONS) ×2
BALLOON TREK RX 2.5X15 (BALLOONS) ×1 IMPLANT
CATH INFINITI 5FR JL4 (CATHETERS) ×2 IMPLANT
CATH INFINITI JR4 5F (CATHETERS) ×2 IMPLANT
CATH VISTA GUIDE 6FR XB3.5 SH (CATHETERS) ×2 IMPLANT
DEVICE RAD TR BAND REGULAR (VASCULAR PRODUCTS) ×2 IMPLANT
GLIDESHEATH SLEND SS 6F .021 (SHEATH) ×2 IMPLANT
GUIDEWIRE INQWIRE 1.5J.035X260 (WIRE) ×1 IMPLANT
INQWIRE 1.5J .035X260CM (WIRE) ×2
KIT ENCORE 26 ADVANTAGE (KITS) ×2 IMPLANT
KIT MANI 3VAL PERCEP (MISCELLANEOUS) ×2 IMPLANT
PACK CARDIAC CATH (CUSTOM PROCEDURE TRAY) ×2 IMPLANT
STENT RESOLUTE ONYX 2.25X8 (Permanent Stent) ×2 IMPLANT
STENT RESOLUTE ONYX 2.5X15 (Permanent Stent) ×2 IMPLANT
WIRE G HI TQ BMW 190 (WIRE) ×2 IMPLANT

## 2020-02-13 NOTE — Progress Notes (Signed)
PROGRESS NOTE    Kayla Maxwell  GNF:[id number] DOB: 11-01-1945 DOA: 02/12/2020 PCP: Mar Daring, PA-C   Brief Narrative: Taken from H&P. 75 year old female with past medical history of coronary artery disease, previous CVA, atrial fibrillation on Xarelto, gastroesophageal reflux disease, hypertension, hyperlipidemia who presents to [facility] emergency department with complaints of weakness.  Patient explains that approximately 5 days ago at 1:30 AM she was sitting on her couch at home when she suddenly began to experience chest discomfort.  Patient describes chest discomfort as midsternal in location, severe in intensity, pressure-like in quality and associated with shortness of breath.  This discomfort had no alleviating factors.  Discomfort lasted for approximately 3 hours before slowly improving.  The following day, patient states that her chest discomfort had resolved but she noticed that she was feeling extremely weak and short of breath.  The shortness of breath and weakness was severe and worse with any exertion whatsoever.  She denies fevers, sick contacts, recent travel, dysuria, vomiting, cough or confirmed contact with COVID-19 infection.    The symptoms continued to persist for several days until the patient eventually presented to [facility] emergency department for evaluation.  She was found to have ST elevation in lateral leads and markedly elevated troponin.  Code STEMI was called but due to her symptoms going on for many days it was decided to do further work-up with echocardiogram followed by cardiac catheterization tomorrow.  Troponin started trending down, echocardiogram with lateral wall motion abnormalities and reduced EF to 30 to 35%.  Subjective: Patient denies any chest pain or shortness of breath while resting comfortably in bed.  Sister at bedside.  Agreeable to do cardiac catheterization tomorrow.  Assessment & Plan:   Principal Problem:   STEMI (ST elevation  myocardial infarction) ([place]) Active Problems:   Gastro-esophageal reflux disease without esophagitis   Mixed hyperlipidemia   Chronic atrial fibrillation (HCC)   Benign essential HTN  STEMI.  Although having less than 1 mm ST elevation in V5 and V6 and keeping in mind the duration of her illness she most likely had STEMI few days ago.  She was started on heparin infusion. Echocardiogram with new diagnosis of reduced EF to 30 to 35% and lateral wall motion abnormalities. Going for cardiac catheterization tomorrow. -Continue with heparin infusion -Continue with metoprolol -Continue with statin  Chronic atrial fibrillation.  Holding home dose of Xarelto as she is on heparin infusion. -Continue with metoprolol.  Essential hypertension.  Blood pressure within goal -Continue home meds.  GERD. -Continue PPI  Objective: Vitals:   02/13/20 1615 02/13/20 1620 02/13/20 1630 02/13/20 1657  BP: (!) 102/49 (!) 102/49 (!) 103/52 (!) 111/91  Pulse: 85 91 81 (!) 103  Resp: 20 (!) 21 16   Temp:    98.2 F (36.8 C)  TempSrc:    Oral  SpO2: 96% 93% 96% 99%  Weight:      Height:        Intake/Output Summary (Last 24 hours) at 02/13/2020 1715 Last data filed at 02/13/2020 1141 Gross per 24 hour  Intake 553.42 ml  Output 400 ml  Net 153.42 ml   Filed Weights   02/12/20 1118 02/12/20 1647 02/13/20 0238  Weight: 77.1 kg 80.6 kg 81 kg    Examination:  General exam: Appears calm and comfortable  Respiratory system: Clear to auscultation. Respiratory effort normal. Cardiovascular system: Irregularly irregular Gastrointestinal system: Soft, nontender, nondistended, bowel sounds positive. Central nervous system: Alert and oriented. No focal neurological deficits.  Extremities: No edema, no cyanosis, pulses intact and symmetrical. Psychiatry: Judgement and insight appear normal. Mood & affect appropriate.    DVT prophylaxis: Heparin infusion Code Status: Full Family Communication: Sister  was updated at bedside. Disposition Plan:  Status is: Inpatient  Remains inpatient appropriate because:Inpatient level of care appropriate due to severity of illness   Dispo: The patient is from: Home              Anticipated d/c is to: Home              Anticipated d/c date is: 2 days              Patient currently is not medically stable to d/c.   Difficult to place patient No              Level of care: Progressive Cardiac  Consultants:   Cardiology  Procedures:  Antimicrobials:   Data Reviewed: I have personally reviewed following labs and imaging studies  CBC: Recent Labs  Lab 02/12/20 1120 02/13/20 0658  WBC 12.4* 9.1  NEUTROABS  --  6.7  HGB 12.5 11.1*  HCT 41.9 37.6  MCV 81.0 81.4  PLT 317 563   Basic Metabolic Panel: Recent Labs  Lab 02/12/20 1120 02/13/20 0658  NA 138 137  K 4.2 3.7  CL 104 104  CO2 25 22  GLUCOSE 122* 147*  BUN 28* 25*  CREATININE 1.19* 1.14*  CALCIUM 9.0 8.3*  MG  --  1.9   GFR: Estimated Creatinine Clearance: 45.5 mL/min (A) (by C-G formula based on SCr of 1.14 mg/dL (H)). Liver Function Tests: Recent Labs  Lab 02/13/20 0658  AST 37  ALT 40  ALKPHOS 94  BILITOT 0.8  PROT 5.6*  ALBUMIN 2.9*   No results for input(s): LIPASE, AMYLASE in the last 168 hours. No results for input(s): AMMONIA in the last 168 hours. Coagulation Profile: Recent Labs  Lab 02/12/20 1136 02/13/20 0658  INR 1.3* 1.2   Cardiac Enzymes: No results for input(s): CKTOTAL, CKMB, CKMBINDEX, TROPONINI in the last 168 hours. BNP (last 3 results) No results for input(s): PROBNP in the last 8760 hours. HbA1C: No results for input(s): HGBA1C in the last 72 hours. CBG: No results for input(s): GLUCAP in the last 168 hours. Lipid Profile: Recent Labs    02/13/20 0658  CHOL 106  HDL 36*  LDLCALC 53  TRIG 87  CHOLHDL 2.9   Thyroid Function Tests: No results for input(s): TSH, T4TOTAL, FREET4, T3FREE, THYROIDAB in the last 72 hours. Anemia  Panel: No results for input(s): VITAMINB12, FOLATE, FERRITIN, TIBC, IRON, RETICCTPCT in the last 72 hours. Sepsis Labs: No results for input(s): PROCALCITON, LATICACIDVEN in the last 168 hours.  Recent Results (from the past 240 hour(s))  Resp Panel by RT-PCR (Flu A&B, Covid) Nasopharyngeal Swab     Status: None   Collection Time: 02/12/20 11:30 AM   Specimen: Nasopharyngeal Swab; Nasopharyngeal(NP) swabs in vial transport medium  Result Value Ref Range Status   SARS Coronavirus 2 by RT PCR NEGATIVE NEGATIVE Final    Comment: (NOTE) SARS-CoV-2 target nucleic acids are NOT DETECTED.  The SARS-CoV-2 RNA is generally detectable in upper respiratory specimens during the acute phase of infection. The lowest concentration of SARS-CoV-2 viral copies this assay can detect is 138 copies/mL. A negative result does not preclude SARS-Cov-2 infection and should not be used as the sole basis for treatment or other patient management decisions. A negative result may occur with  improper specimen collection/handling, submission of specimen other than nasopharyngeal swab, presence of viral mutation(s) within the areas targeted by this assay, and inadequate number of viral copies(<138 copies/mL). A negative result must be combined with clinical observations, patient history, and epidemiological information. The expected result is Negative.  Fact Sheet for Patients:  EntrepreneurPulse.com.au  Fact Sheet for Healthcare Providers:  IncredibleEmployment.be  This test is no t yet approved or cleared by the [place] FDA and  has been authorized for detection and/or diagnosis of SARS-CoV-2 by FDA under an Emergency Use Authorization (EUA). This EUA will remain  in effect (meaning this test can be used) for the duration of the COVID-19 declaration under Section 564(b)(1) of the Act, 21 U.S.C.section 360bbb-3(b)(1), unless the authorization is terminated  or  revoked sooner.       Influenza A by PCR NEGATIVE NEGATIVE Final   Influenza B by PCR NEGATIVE NEGATIVE Final    Comment: (NOTE) The Xpert Xpress SARS-CoV-2/FLU/RSV plus assay is intended as an aid in the diagnosis of influenza from Nasopharyngeal swab specimens and should not be used as a sole basis for treatment. Nasal washings and aspirates are unacceptable for Xpert Xpress SARS-CoV-2/FLU/RSV testing.  Fact Sheet for Patients: EntrepreneurPulse.com.au  Fact Sheet for Healthcare Providers: IncredibleEmployment.be  This test is not yet approved or cleared by the [place] FDA and has been authorized for detection and/or diagnosis of SARS-CoV-2 by FDA under an Emergency Use Authorization (EUA). This EUA will remain in effect (meaning this test can be used) for the duration of the COVID-19 declaration under Section 564(b)(1) of the Act, 21 U.S.C. section 360bbb-3(b)(1), unless the authorization is terminated or revoked.  Performed at [facility], [address], [place], [place] [address]      Radiology Studies: DG Chest 2 View  Result Date: 02/12/2020 CLINICAL DATA:  Shortness of breath and chest pain. EXAM: CHEST - 2 VIEW COMPARISON:  06/09/2015 FINDINGS: Lungs are hyperexpanded. The lungs are clear without focal pneumonia, edema, pneumothorax or pleural effusion. Interstitial markings are diffusely coarsened with chronic features. Cardiopericardial silhouette is at upper limits of normal for size. Telemetry leads overlie the chest. IMPRESSION: Hyperexpansion without acute cardiopulmonary findings. Electronically Signed   By: Misty Stanley M.D.   On: 02/12/2020 12:20   CARDIAC CATHETERIZATION  Result Date: 02/13/2020  Mid LAD lesion is 99% stenosed.  Post intervention, there is a 0% residual stenosis.  A stent was successfully placed.  Minimal disease in circumflex  Minimal disease in RCA  Successful PCI and stent of mid  LAD  Conclusion Severely depressed left ventricular function anterior apical septal akinesis EF less than 25% Successful cardiac cath Radial approach Large left main free of disease LAD large with 100% mid lesion thrombotic ulcerated Circumflex medium to large minor irregularity RCA medium free of disease Intervention Successful PCI and stent of mid LAD with DES 2.5 x 15 mm resolute Onyx Questionable distal edge dissection Placement of a short 2.25 x 8 mm resolute Onyx to the distal edge Evidence of potential distal embolization Possible no reflow Treated with nitro with significant improvement in flow Patient will need extensive heart failure therapy and evaluation for ischemic cardiomyopathy  ECHOCARDIOGRAM COMPLETE  Result Date: 02/13/2020    ECHOCARDIOGRAM REPORT   Patient Name:   [place] Date of Exam: 02/12/2020 Medical Rec #:  [id number]      Height:       65.0 in Accession #:    [id number]     Weight:  177.8 lb Date of Birth:  Mar 27, 1945      BSA:          1.882 m Patient Age:    51 years       BP:           103/64 mmHg Patient Gender: F              HR:           105 bpm. Exam Location:  [facility] Procedure: 2D Echo, Cardiac Doppler, Color Doppler and Intracardiac            Opacification Agent Indications:     I21.3 STEMI  History:         Patient has prior history of Echocardiogram examinations, most                  recent 06/07/2015. Risk Factors:Hypertension and Dyslipidemia.                  TIA. Sleep apnea. Atrial Fibrillation. Coronary artery disease.  Sonographer:     Wilford Sports Rodgers-Jones Referring Phys:  [id number] Vernelle Emerald Diagnosing Phys: Yolonda Kida MD IMPRESSIONS  1. Large area anterior/Apical/Septal/Lateral Hypokinesis.  2. Left ventricular ejection fraction, by estimation, is 30 to 35%. The left ventricle has moderately decreased function. The left ventricle demonstrates regional wall motion abnormalities (see scoring diagram/findings for description). Left ventricular   diastolic parameters were normal.  3. Right ventricular systolic function is mildly reduced. The right ventricular size is mildly enlarged.  4. The mitral valve is normal in structure. Trivial mitral valve regurgitation.  5. The aortic valve is normal in structure. Aortic valve regurgitation is not visualized. Conclusion(s)/Recommendation(s): Findings consistent with ischemic cardiomyopathy. No left ventricular mural or apical thrombus/thrombi. FINDINGS  Left Ventricle: Left ventricular ejection fraction, by estimation, is 30 to 35%. The left ventricle has moderately decreased function. The left ventricle demonstrates regional wall motion abnormalities. Definity contrast agent was given IV to delineate the left ventricular endocardial borders. The left ventricular internal cavity size was normal in size. There is no left ventricular hypertrophy. Left ventricular diastolic parameters were normal. Right Ventricle: The right ventricular size is mildly enlarged. No increase in right ventricular wall thickness. Right ventricular systolic function is mildly reduced. Left Atrium: Left atrial size was normal in size. Right Atrium: Right atrial size was normal in size. Pericardium: There is no evidence of pericardial effusion. Mitral Valve: The mitral valve is normal in structure. Trivial mitral valve regurgitation. Tricuspid Valve: The tricuspid valve is normal in structure. Tricuspid valve regurgitation is mild. Aortic Valve: The aortic valve is normal in structure. Aortic valve regurgitation is not visualized. Pulmonic Valve: The pulmonic valve was normal in structure. Pulmonic valve regurgitation is not visualized. Aorta: The ascending aorta was not well visualized. IAS/Shunts: No atrial level shunt detected by color flow Doppler. Additional Comments: Large area anterior/Apical/Septal/Lateral Hypokinesis.  LEFT VENTRICLE PLAX 2D LVIDd:         4.89 cm LVIDs:         3.13 cm LV PW:         1.25 cm LV IVS:        0.99 cm  LVOT diam:     1.80 cm LV SV:         23 LV SV Index:   12 LVOT Area:     2.54 cm  RIGHT VENTRICLE            IVC RV Basal diam:  3.37 cm    IVC diam: 2.03 cm RV S prime:     6.47 cm/s TAPSE (M-mode): 0.8 cm LEFT ATRIUM             Index       RIGHT ATRIUM           Index LA diam:        5.80 cm 3.08 cm/m  RA Area:     23.30 cm LA Vol (A2C):   68.2 ml 36.24 ml/m RA Volume:   75.90 ml  40.34 ml/m LA Vol (A4C):   66.5 ml 35.34 ml/m LA Biplane Vol: 74.5 ml 39.59 ml/m  AORTIC VALVE LVOT Vmax:   61.57 cm/s LVOT Vmean:  41.233 cm/s LVOT VTI:    0.089 m  AORTA Ao Root diam: 3.10 cm MV E velocity: 105.75 cm/s  TRICUSPID VALVE                             TR Peak grad:   25.8 mmHg                             TR Vmax:        254.00 cm/s                              SHUNTS                             Systemic VTI:  0.09 m                             Systemic Diam: 1.80 cm Dwayne Prince Rome MD Electronically signed by Yolonda Kida MD Signature Date/Time: 02/13/2020/8:01:07 AM    Final     Scheduled Meds: . aspirin  81 mg Oral Daily  . aspirin EC  81 mg Oral Daily  . lisinopril  2.5 mg Oral Daily  . melatonin  5 mg Oral QHS  . metoprolol tartrate  50 mg Oral BID  . pantoprazole  40 mg Oral Daily  . pravastatin  40 mg Oral q1800  . sodium chloride flush  10 mL Intravenous Q12H  . sodium chloride flush  3 mL Intravenous Q12H  . sodium chloride flush  3 mL Intravenous Q12H  . ticagrelor  90 mg Oral BID   Continuous Infusions: . sodium chloride    . sodium chloride 50 mL/hr at 02/13/20 1614  . tirofiban 0.075 mcg/kg/min (02/13/20 1618)     LOS: 1 day   Time spent: 40 minutes.  Lorella Nimrod, MD [facility]  If 7PM-7AM, please contact night-coverage [email or web address]  02/13/2020, 5:15 PM   This record has been created using Systems analyst. Errors have been sought and corrected,but may not always be located. Such creation errors do not reflect on the standard of care.

## 2020-02-13 NOTE — Consult Note (Addendum)
[place] for Heparin Infusion Indication: atrial fibrillation and ACS  Allergies  Allergen Reactions  . Codeine Other (See Comments)    Makes her feel "crazy"  . Penicillins Swelling    Did it involve swelling of the face/tongue/throat, SOB, or low BP? Unknown Did it involve sudden or severe rash/hives, skin peeling, or any reaction on the inside of your mouth or nose? No Did you need to seek medical attention at a hospital or doctor's office? Unknown When did it last happen?Childhood allergy If all above answers are "NO", may proceed with cephalosporin use.   . Adhesive [Tape] Hives    (Latex)  . Sulfa Antibiotics Nausea Only and Rash    Patient Measurements: Height: 5\' 5"  (165.1 cm) Weight: 81 kg (178 lb 9.2 oz) IBW/kg (Calculated) : 57 Heparin Dosing Weight: 77.1 kg  Vital Signs: Temp: 98.1 F (36.7 C) (02/09 0740) Temp Source: Oral (02/09 0740) BP: 103/43 (02/09 0740) Pulse Rate: 90 (02/09 0740)  Labs: Recent Labs    02/12/20 1120 02/12/20 1136 02/12/20 1307 02/12/20 2049 02/13/20 0658  HGB 12.5  --   --   --  11.1*  HCT 41.9  --   --   --  37.6  PLT 317  --   --   --  264  APTT  --  36  --  61* 90*  LABPROT  --  16.0*  --   --  15.1  INR  --  1.3*  --   --  1.2  HEPARINUNFRC  --  1.34*  --   --  0.78*  CREATININE 1.19*  --   --   --  1.14*  TROPONINIHS 74,259*  --  21,875* 56,387*  --     Estimated Creatinine Clearance: 45.5 mL/min (A) (by C-G formula based on SCr of 1.14 mg/dL (H)).   Medical History: Past Medical History:  Diagnosis Date  . Acid reflux    takes Nexium daily  . Allergy    takes Singulair daily as needed  . Anemia   . Atrial fibrillation ([place])   . Cancer ([place]) AGE 51   CERVICAL cancer with removal  . Chronic back pain    stenosis  . Complication of anesthesia   . Constipation    takes Colace daily as needed  . Coronary artery disease   . Difficult intubation   . Dysrhythmia     atrial fibrillation dx 04/2014  . Headache    sinus   . History of blood transfusion   . History of shingles   . Hyperlipidemia    takes Pravastatin daily-started taking this 6 months ago  . Hypertension   . Lumbar surgical wound fluid collection   . Sleep apnea    CPAP  . Stroke ([place])    TIA'S X 2 --- one last yr and one this yr...lost her memory over 4-6 hrs.  . Stroke ([place]) 06-04-15   left with right side weakness  . TIA (transient ischemic attack)    showed up on an MRI but she never knew it  . Urinary frequency      Assessment: Pt is a 75 y.o female admitted to ED for NSTEMI/ACS with a PMH of A.fib. Currently takes Xarelto at home (last reported dose 2/7 AM). EKG reported A.fib with rapid ventricular response, left axis deviation, low voltage, anterolateral infarct and nonspecific infarct and nonspecific ST changes. Per cards, catherization deferred, pending further workup. Transitioned from Xarelto to heparin  in case invasive procedures are needed. Pharmacy was consulted for Heparin dosing and monitoring.  Troponin HS: 24,772>>21,875 >> 19K  Date Time HL/aPTT Rate/Comment 02/08 1136 1.34 / 36s W/o correlation d/t DOAC 02/08 2049 61s  Slightly subthera; 1050 un/hr 02/09  0658 0.78/90s Goal range  Heparin Dosing Weight: 77.1 kg  Goal of Therapy:  Heparin level 0.3-0.7 units/ml once aPTT and heparin level correlate.  APTT level 66-102 seconds.  Monitor platelets by anticoagulation protocol: Yes   Plan:  Heparin level is [place] elevated and not correlating with aPTT. APTT is therapeutic. Will continue heparin infusion at 1200 units/hr. Recheck aPTT in 8 hours. HL and CBC daily while on heparin.   Note - Baseline HL elevated due to Xarelto, will need to adjust heparin infusion based on aPTT level until heparin and aPTT levels correlate   Oswald Hillock, PharmD, BCPS 02/13/2020,8:07 AM

## 2020-02-13 NOTE — Consult Note (Signed)
CARDIOLOGY CONSULT NOTE               Patient ID: Kayla Maxwell MRN: [id number] DOB/AGE: 01-06-1945 75 y.o.  Admit date: 02/12/2020 Referring Physician Dr. Lorella Nimrod hospitalist Primary Physician Dr. Lillia Abed, PA-C Primary Cardiologist Nehemiah Massed Reason for Consultation acute myocardial infarction late presenter STEMI  HPI: Patient is a 75 year old female history of atrial fibrillation on Xarelto previous CVA reflux hypertension hyperlipidemia presented to the office after given a history of a week ago having what sounds like a acute myocardial infarction and she stated home with severe substernal chest pressure radiating to the arm with shortness of breath and dyspnea and sweating she said the episode the worst pain she is ever had but she [place] did not call for help or EMS and suffered through 2 to 3 hours of severe discomfort before it began to improve she finally told her sister who told her to come to see the doctor and one seen in the cardiologist office we immediately recognized on EKG that she had a recent myocardial infarction and advised her to come to the emergency room.  Once evaluated emergency room STEMI physician Dr. And evaluated her and determined that this was a late presenter so no acute catheter intervention was indicated at that moment and it can be stage so she was placed on heparin initial troponins were 24,000 which is not surprising.  Patient denied any chest pain or significant shortness of breath currently  Review of systems complete and found to be negative unless listed above     Past Medical History:  Diagnosis Date  . Acid reflux    takes Nexium daily  . Allergy    takes Singulair daily as needed  . Anemia   . Atrial fibrillation ([place])   . Cancer ([place]) AGE 78   CERVICAL cancer with removal  . Chronic back pain    stenosis  . Complication of anesthesia   . Constipation    takes Colace daily as needed  . Coronary artery disease   .  Difficult intubation   . Dysrhythmia    atrial fibrillation dx 04/2014  . Headache    sinus   . History of blood transfusion   . History of shingles   . Hyperlipidemia    takes Pravastatin daily-started taking this 6 months ago  . Hypertension   . Lumbar surgical wound fluid collection   . Sleep apnea    CPAP  . Stroke ([place])    TIA'S X 2 --- one last yr and one this yr...lost her memory over 4-6 hrs.  . Stroke ([place]) 06-04-15   left with right side weakness  . TIA (transient ischemic attack)    showed up on an MRI but she never knew it  . Urinary frequency     Past Surgical History:  Procedure Laterality Date  . ABDOMINAL HYSTERECTOMY    . APPENDECTOMY  1963  . BACK SURGERY  08/19/2014   Dr. Hal Neer ([place] otho)  . BREAST SURGERY  1984   augementation  . [place]  . CHOLECYSTECTOMY  2014   Dr Jamal Collin  . COLONOSCOPY    . ESOPHAGOGASTRODUODENOSCOPY    . HAMMER TOE SURGERY Bilateral   . LOWER EXTREMITY ANGIOGRAPHY Left 01/23/2019   Procedure: LOWER EXTREMITY ANGIOGRAPHY;  Surgeon: Katha Cabal, MD;  Location: [place] [facility];  Service: Cardiovascular;  Laterality: Left;  . LOWER EXTREMITY ANGIOGRAPHY Left 02/20/2019   Procedure: LOWER EXTREMITY ANGIOGRAPHY;  Surgeon: Katha Cabal, MD;  Location: [place] [facility];  Service: Cardiovascular;  Laterality: Left;  . LUMBAR LAMINECTOMY WITH COFLEX 1 LEVEL Bilateral 07/04/2014   Procedure: Laminectomy and Foraminotomy - bilateral - Lumbar Four-Five with coflex ;  Surgeon: Karie Chimera, MD;  Location: [place] [facility];  Service: Neurosurgery;  Laterality: Bilateral;  . LUMBAR WOUND DEBRIDEMENT N/A 08/19/2014   Procedure: Exploration of Lumbar wound;  Surgeon: Karie Chimera, MD;  Location: [place] [facility];  Service: Neurosurgery;  Laterality: N/A;  . PERIPHERAL VASCULAR CATHETERIZATION Left 08/12/2015   Procedure: Lower Extremity Angiography;  Surgeon: Katha Cabal, MD;  Location: [place] [facility];   Service: Cardiovascular;  Laterality: Left;  . TONSILLECTOMY    . VENTRAL HERNIA REPAIR N/A 05/26/2015   Procedure: HERNIA REPAIR VENTRAL ADULT;  Surgeon: Christene Lye, MD;  Location: [facility];  Service: General;  Laterality: N/A;    Medications Prior to Admission  Medication Sig Dispense Refill Last Dose  . aspirin EC 81 MG EC tablet Take 1 tablet (81 mg total) by mouth daily. 30 tablet 1 02/11/2020 at 0800  . FLUoxetine (PROZAC) 20 MG capsule TAKE 1 CAPSULE BY MOUTH EVERY DAY (Patient taking differently: Take 20 mg by mouth daily.) 90 capsule 0 02/11/2020 at 0800  . HYDROcodone-acetaminophen (NORCO) 5-325 MG tablet Take 1-2 tablets by mouth every 8 (eight) hours as needed for moderate pain or severe pain. 90 tablet 0 Past Week at prn  . lisinopril (ZESTRIL) 2.5 MG tablet TAKE 1 TABLET BY MOUTH EVERY DAY (Patient taking differently: Take 2.5 mg by mouth daily.) 90 tablet 0 02/11/2020 at 0800  . metoprolol tartrate (LOPRESSOR) 25 MG tablet TAKE 2 TABLETS BY MOUTH TWICE A DAY (Patient taking differently: Take 50 mg by mouth 2 (two) times daily.) 360 tablet 1 02/11/2020 at 0800  . omeprazole (PRILOSEC) 40 MG capsule TAKE 1 CAPSULE BY MOUTH EVERY DAY (Patient taking differently: Take 40 mg by mouth daily.) 90 capsule 1 02/11/2020 at 0800  . rivaroxaban (XARELTO) 20 MG TABS tablet Take 20 mg by mouth daily with supper.    02/11/2020 at 0800  . albuterol (PROVENTIL HFA;VENTOLIN HFA) 108 (90 Base) MCG/ACT inhaler Inhale 2 puffs into the lungs every 6 (six) hours as needed for wheezing or shortness of breath. 1 Inhaler 5 unknown at prn  . gabapentin (NEURONTIN) 300 MG capsule TAKE 1 CAPSULE BY MOUTH EVERYDAY AT BEDTIME (Patient taking differently: Take 300 mg by mouth at bedtime.) 30 capsule 3 02/10/2020 at 2100  . lovastatin (MEVACOR) 40 MG tablet Take 1 tablet (40 mg total) by mouth at bedtime. 90 tablet 3 02/10/2020 at 2100  . ondansetron (ZOFRAN ODT) 4 MG disintegrating tablet Take 1 tablet (4 mg total) by  mouth every 8 (eight) hours as needed. 20 tablet 0 unknown at prn  . ondansetron (ZOFRAN) 4 MG tablet Take 1 tablet (4 mg total) by mouth every 8 (eight) hours as needed for nausea or vomiting. (Patient not taking: No sig reported) 20 tablet 0    Social History   Socioeconomic History  . Marital status: Divorced    Spouse name: Not on file  . Number of children: 1  . Years of education: College  . Highest education level: Some college, no degree  Occupational History  . Occupation: Retired  Tobacco Use  . Smoking status: Former Smoker    Packs/day: 1.00    Years: 47.00    Pack years: 47.00    Quit date: 08/19/2014  Years since quitting: 5.4  . Smokeless tobacco: Former Systems developer    Quit date: 08/19/2014  Vaping Use  . Vaping Use: Never used  Substance and Sexual Activity  . Alcohol use: No  . Drug use: No  . Sexual activity: Not on file  Other Topics Concern  . Not on file  Social History Narrative  . Not on file   Social Determinants of Health   Financial Resource Strain: Low Risk   . Difficulty of Paying Living Expenses: Not hard at all  Food Insecurity: No Food Insecurity  . Worried About Charity fundraiser in the Last Year: Never true  . Ran Out of Food in the Last Year: Never true  Transportation Needs: No Transportation Needs  . Lack of Transportation (Medical): No  . Lack of Transportation (Non-Medical): No  Physical Activity: Inactive  . Days of Exercise per Week: 0 days  . Minutes of Exercise per Session: 0 min  Stress: No Stress Concern Present  . Feeling of Stress : Not at all  Social Connections: Socially Isolated  . Frequency of Communication with Friends and Family: More than three times a week  . Frequency of Social Gatherings with Friends and Family: Three times a week  . Attends Religious Services: Never  . Active Member of Clubs or Organizations: No  . Attends Archivist Meetings: Never  . Marital Status: Divorced  Human resources officer  Violence: Not At Risk  . Fear of Current or Ex-Partner: No  . Emotionally Abused: No  . Physically Abused: No  . Sexually Abused: No    Family History  Problem Relation Age of Onset  . Alzheimer's disease Mother   . Heart attack Father   . Pancreatic cancer Sister   . Healthy Brother       Review of systems complete and found to be negative unless listed above      PHYSICAL EXAM  General: Well developed, well nourished, in no acute distress HEENT:  Normocephalic and atramatic Neck:  No JVD.  Lungs: Clear bilaterally to auscultation and percussion. Heart: HRRR . Normal S1 and S2 without gallops or murmurs.  Abdomen: Bowel sounds are positive, abdomen soft and non-tender  Msk:  Back normal, normal gait. Normal strength and tone for age. Extremities: No clubbing, cyanosis or edema.   Neuro: Alert and oriented X 3. Psych:  Good affect, responds appropriately  Labs:   Lab Results  Component Value Date   WBC 9.1 02/13/2020   HGB 11.1 (L) 02/13/2020   HCT 37.6 02/13/2020   MCV 81.4 02/13/2020   PLT 264 02/13/2020    Recent Labs  Lab 02/13/20 0658  NA 137  K 3.7  CL 104  CO2 22  BUN 25*  CREATININE 1.14*  CALCIUM 8.3*  PROT 5.6*  BILITOT 0.8  ALKPHOS 94  ALT 40  AST 37  GLUCOSE 147*   Lab Results  Component Value Date   TROPONINI 0.06 (HH) 08/16/2016    Lab Results  Component Value Date   CHOL 106 02/13/2020   CHOL 134 04/04/2017   CHOL 138 06/30/2015   Lab Results  Component Value Date   HDL 36 (L) 02/13/2020   HDL 51 04/04/2017   HDL 55 06/30/2015   Lab Results  Component Value Date   LDLCALC 53 02/13/2020   LDLCALC 50 04/04/2017   LDLCALC 47 06/30/2015   Lab Results  Component Value Date   TRIG 87 02/13/2020   TRIG 165 (H) 04/04/2017  TRIG 180 (H) 06/30/2015   Lab Results  Component Value Date   CHOLHDL 2.9 02/13/2020   CHOLHDL 2.6 04/04/2017   CHOLHDL 2.5 06/30/2015   No results found for: LDLDIRECT    Radiology: DG Chest 2  View  Result Date: 02/12/2020 CLINICAL DATA:  Shortness of breath and chest pain. EXAM: CHEST - 2 VIEW COMPARISON:  06/09/2015 FINDINGS: Lungs are hyperexpanded. The lungs are clear without focal pneumonia, edema, pneumothorax or pleural effusion. Interstitial markings are diffusely coarsened with chronic features. Cardiopericardial silhouette is at upper limits of normal for size. Telemetry leads overlie the chest. IMPRESSION: Hyperexpansion without acute cardiopulmonary findings. Electronically Signed   By: Misty Stanley M.D.   On: 02/12/2020 12:20   ECHOCARDIOGRAM COMPLETE  Result Date: 02/13/2020    ECHOCARDIOGRAM REPORT   Patient Name:   KYLII ENNIS Privett Date of Exam: 02/12/2020 Medical Rec #:  [id number]      Height:       65.0 in Accession #:    [id number]     Weight:       177.8 lb Date of Birth:  April 23, 1945      BSA:          1.882 m Patient Age:    35 years       BP:           103/64 mmHg Patient Gender: F              HR:           105 bpm. Exam Location:  [facility] Procedure: 2D Echo, Cardiac Doppler, Color Doppler and Intracardiac            Opacification Agent Indications:     I21.3 STEMI  History:         Patient has prior history of Echocardiogram examinations, most                  recent 06/07/2015. Risk Factors:Hypertension and Dyslipidemia.                  TIA. Sleep apnea. Atrial Fibrillation. Coronary artery disease.  Sonographer:     Wilford Sports Rodgers-Jones Referring Phys:  [id number] Vernelle Emerald Diagnosing Phys: Yolonda Kida MD IMPRESSIONS  1. Large area anterior/Apical/Septal/Lateral Hypokinesis.  2. Left ventricular ejection fraction, by estimation, is 30 to 35%. The left ventricle has moderately decreased function. The left ventricle demonstrates regional wall motion abnormalities (see scoring diagram/findings for description). Left ventricular  diastolic parameters were normal.  3. Right ventricular systolic function is mildly reduced. The right ventricular size is mildly enlarged.  4.  The mitral valve is normal in structure. Trivial mitral valve regurgitation.  5. The aortic valve is normal in structure. Aortic valve regurgitation is not visualized. Conclusion(s)/Recommendation(s): Findings consistent with ischemic cardiomyopathy. No left ventricular mural or apical thrombus/thrombi. FINDINGS  Left Ventricle: Left ventricular ejection fraction, by estimation, is 30 to 35%. The left ventricle has moderately decreased function. The left ventricle demonstrates regional wall motion abnormalities. Definity contrast agent was given IV to delineate the left ventricular endocardial borders. The left ventricular internal cavity size was normal in size. There is no left ventricular hypertrophy. Left ventricular diastolic parameters were normal. Right Ventricle: The right ventricular size is mildly enlarged. No increase in right ventricular wall thickness. Right ventricular systolic function is mildly reduced. Left Atrium: Left atrial size was normal in size. Right Atrium: Right atrial size was normal in size. Pericardium: There is no evidence of pericardial  effusion. Mitral Valve: The mitral valve is normal in structure. Trivial mitral valve regurgitation. Tricuspid Valve: The tricuspid valve is normal in structure. Tricuspid valve regurgitation is mild. Aortic Valve: The aortic valve is normal in structure. Aortic valve regurgitation is not visualized. Pulmonic Valve: The pulmonic valve was normal in structure. Pulmonic valve regurgitation is not visualized. Aorta: The ascending aorta was not well visualized. IAS/Shunts: No atrial level shunt detected by color flow Doppler. Additional Comments: Large area anterior/Apical/Septal/Lateral Hypokinesis.  LEFT VENTRICLE PLAX 2D LVIDd:         4.89 cm LVIDs:         3.13 cm LV PW:         1.25 cm LV IVS:        0.99 cm LVOT diam:     1.80 cm LV SV:         23 LV SV Index:   12 LVOT Area:     2.54 cm  RIGHT VENTRICLE            IVC RV Basal diam:  3.37 cm    IVC  diam: 2.03 cm RV S prime:     6.47 cm/s TAPSE (M-mode): 0.8 cm LEFT ATRIUM             Index       RIGHT ATRIUM           Index LA diam:        5.80 cm 3.08 cm/m  RA Area:     23.30 cm LA Vol (A2C):   68.2 ml 36.24 ml/m RA Volume:   75.90 ml  40.34 ml/m LA Vol (A4C):   66.5 ml 35.34 ml/m LA Biplane Vol: 74.5 ml 39.59 ml/m  AORTIC VALVE LVOT Vmax:   61.57 cm/s LVOT Vmean:  41.233 cm/s LVOT VTI:    0.089 m  AORTA Ao Root diam: 3.10 cm MV E velocity: 105.75 cm/s  TRICUSPID VALVE                             TR Peak grad:   25.8 mmHg                             TR Vmax:        254.00 cm/s                              SHUNTS                             Systemic VTI:  0.09 m                             Systemic Diam: 1.80 cm Nai Borromeo D Safal Halderman MD Electronically signed by Yolonda Kida MD Signature Date/Time: 02/13/2020/8:01:07 AM    Final     EKG: Normal sinus rhythm Q waves anterior laterally persistent ST elevation suggestive of evolving STEMI rate of 90  ASSESSMENT AND PLAN:  Anterior STEMI late presenter Ischemic cardiomyopathy Mild congestive heart failure Hypertension Hyperlipidemia Obesity Coronary artery disease Chronic renal insufficiency stage II History of CVA History of chronic atrial fibrillation on Xarelto GERD Elevated troponins . Plan Agree with admit Place on telemetry anticoagulation with heparin Continue  heparin therapy hold Xarelto Continue  rate control for atrial fibrillation Recommend ACE inhibitor beta-blocker aspirin statin for coronary disease post MI Echocardiogram for assessment left ventricular function overall Will schedule cardiac cath based on Xarelto hopefully to be done in the next few days We will consider Entresto if significant ischemic cardiomyopathy versus ACE ARB Mild heart failure with elevated BNP mostly dyspnea on exertion none at rest no lower extremity edema Reflux with consider omeprazole or Protonix therapy  Signed: Yolonda Kida  MD 02/13/2020, 8:46 AM

## 2020-02-14 ENCOUNTER — Encounter: Payer: Self-pay | Admitting: Internal Medicine

## 2020-02-14 DIAGNOSIS — I482 Chronic atrial fibrillation, unspecified: Secondary | ICD-10-CM | POA: Diagnosis not present

## 2020-02-14 DIAGNOSIS — I213 ST elevation (STEMI) myocardial infarction of unspecified site: Secondary | ICD-10-CM | POA: Diagnosis not present

## 2020-02-14 DIAGNOSIS — K219 Gastro-esophageal reflux disease without esophagitis: Secondary | ICD-10-CM | POA: Diagnosis not present

## 2020-02-14 DIAGNOSIS — I10 Essential (primary) hypertension: Secondary | ICD-10-CM | POA: Diagnosis not present

## 2020-02-14 LAB — CBC
HCT: 36.1 % (ref 36.0–46.0)
Hemoglobin: 11 g/dL — ABNORMAL LOW (ref 12.0–15.0)
MCH: 24.9 pg — ABNORMAL LOW (ref 26.0–34.0)
MCHC: 30.5 g/dL (ref 30.0–36.0)
MCV: 81.7 fL (ref 80.0–100.0)
Platelets: 292 10*3/uL (ref 150–400)
RBC: 4.42 MIL/uL (ref 3.87–5.11)
RDW: 16.7 % — ABNORMAL HIGH (ref 11.5–15.5)
WBC: 9.5 10*3/uL (ref 4.0–10.5)
nRBC: 0 % (ref 0.0–0.2)

## 2020-02-14 LAB — BASIC METABOLIC PANEL
Anion gap: 9 (ref 5–15)
BUN: 18 mg/dL (ref 8–23)
CO2: 21 mmol/L — ABNORMAL LOW (ref 22–32)
Calcium: 8.3 mg/dL — ABNORMAL LOW (ref 8.9–10.3)
Chloride: 106 mmol/L (ref 98–111)
Creatinine, Ser: 0.98 mg/dL (ref 0.44–1.00)
GFR, Estimated: >60 mL/min (ref >60)
Glucose, Bld: 111 mg/dL — ABNORMAL HIGH (ref 70–99)
Potassium: 4.4 mmol/L (ref 3.5–5.1)
Sodium: 136 mmol/L (ref 135–145)

## 2020-02-14 MED ORDER — DAPAGLIFLOZIN PROPANEDIOL 5 MG PO TABS
5.0000 mg | ORAL_TABLET | Freq: Every day | ORAL | Status: DC
  Administered 2020-02-14 – 2020-02-15 (×2): 5 mg via ORAL
  Filled 2020-02-14 (×2): qty 1

## 2020-02-14 MED ORDER — MIDODRINE HCL 5 MG PO TABS
2.5000 mg | ORAL_TABLET | Freq: Three times a day (TID) | ORAL | Status: DC
  Administered 2020-02-14 – 2020-02-15 (×3): 2.5 mg via ORAL
  Filled 2020-02-14 (×3): qty 1

## 2020-02-14 MED ORDER — FLUOXETINE HCL 20 MG PO CAPS
20.0000 mg | ORAL_CAPSULE | Freq: Every day | ORAL | Status: DC
  Administered 2020-02-14 – 2020-02-15 (×2): 20 mg via ORAL
  Filled 2020-02-14 (×3): qty 1

## 2020-02-14 MED ORDER — ROSUVASTATIN CALCIUM 10 MG PO TABS
20.0000 mg | ORAL_TABLET | Freq: Every day | ORAL | Status: DC
  Administered 2020-02-14 – 2020-02-15 (×2): 20 mg via ORAL
  Filled 2020-02-14 (×2): qty 2

## 2020-02-14 MED ORDER — RIVAROXABAN 20 MG PO TABS
20.0000 mg | ORAL_TABLET | Freq: Every day | ORAL | Status: DC
  Administered 2020-02-14 – 2020-02-15 (×2): 20 mg via ORAL
  Filled 2020-02-14 (×2): qty 1

## 2020-02-14 MED ORDER — DM-GUAIFENESIN ER 30-600 MG PO TB12
1.0000 | ORAL_TABLET | Freq: Two times a day (BID) | ORAL | Status: DC
  Administered 2020-02-14 – 2020-02-15 (×3): 1 via ORAL
  Filled 2020-02-14 (×3): qty 1

## 2020-02-14 MED ORDER — FUROSEMIDE 20 MG PO TABS: 20.0000 mg | ORAL_TABLET | Freq: Every day | ORAL | Status: DC | PRN

## 2020-02-14 NOTE — Progress Notes (Signed)
PROGRESS NOTE    Kayla Maxwell  VOZ:[id number] DOB: 11-18-1945 DOA: 02/12/2020 PCP: Mar Daring, PA-C   Brief Narrative: Taken from H&P. 75 year old female with past medical history of coronary artery disease, previous CVA, atrial fibrillation on Xarelto, gastroesophageal reflux disease, hypertension, hyperlipidemia who presents to [facility] emergency department with complaints of weakness.  Patient explains that approximately 5 days ago at 1:30 AM she was sitting on her couch at home when she suddenly began to experience chest discomfort.  Patient describes chest discomfort as midsternal in location, severe in intensity, pressure-like in quality and associated with shortness of breath.  This discomfort had no alleviating factors.  Discomfort lasted for approximately 3 hours before slowly improving.  The following day, patient states that her chest discomfort had resolved but she noticed that she was feeling extremely weak and short of breath.  The shortness of breath and weakness was severe and worse with any exertion whatsoever.  She denies fevers, sick contacts, recent travel, dysuria, vomiting, cough or confirmed contact with COVID-19 infection.    The symptoms continued to persist for several days until the patient eventually presented to [facility] emergency department for evaluation.  She was found to have ST elevation in lateral leads and markedly elevated troponin.  Code STEMI was called but due to her symptoms going on for many days it was decided to do further work-up with echocardiogram followed by cardiac catheterization tomorrow.  Troponin started trending down, echocardiogram with lateral wall motion abnormalities and reduced EF to 30 to 35%. Cardiac catheterization with 90% blockage of mid LAD which was stented successfully.  Subjective: Patient was sitting comfortably in chair when seen today. No chest pain. Cardiology wants to keep her for another day and she  agrees.  Assessment & Plan:   Principal Problem:   STEMI (ST elevation myocardial infarction) ([place]) Active Problems:   Gastro-esophageal reflux disease without esophagitis   Mixed hyperlipidemia   Chronic atrial fibrillation (HCC)   Benign essential HTN  STEMI.  Although having less than 1 mm ST elevation in V5 and V6 and keeping in mind the duration of her illness she most likely had STEMI few days ago.  She was started on heparin infusion. Echocardiogram with new diagnosis of reduced EF to 30 to 35% and lateral wall motion abnormalities. Cardiac catheterization with 90% stenosis of mid LAD s/p successful PCI. -Continue with DAPT -Continue with metoprolol -Continue with statin -Cardiology is recommending LifeVest on discharge, repeat echocardiogram in 45-month if no improvement in EF then she will require AICD.  Chronic atrial fibrillation.  Holding home dose of Xarelto as she is on heparin infusion. -Continue with metoprolol.  Essential hypertension.  Blood pressure within goal -Continue home meds.  GERD. -Continue PPI  Objective: Vitals:   02/14/20 0522 02/14/20 0724 02/14/20 1121 02/14/20 1621  BP: 116/68 95/72 114/61 112/84  Pulse: 78 97 93 (!) 103  Resp: 18 18 18 20   Temp: 97.8 F (36.6 C) 98 F (36.7 C) 99.1 F (37.3 C) (!) 97.5 F (36.4 C)  TempSrc: Oral Oral Oral Axillary  SpO2: 99% 97%  96%  Weight:      Height:        Intake/Output Summary (Last 24 hours) at 02/14/2020 1745 Last data filed at 02/14/2020 1350 Gross per 24 hour  Intake 1417.83 ml  Output 1000 ml  Net 417.83 ml   Filed Weights   02/12/20 1647 02/13/20 0238 02/14/20 0506  Weight: 80.6 kg 81 kg 81.7 kg  Examination:  General. Well-developed lady, in no acute distress. Pulmonary.  Lungs clear bilaterally, normal respiratory effort. CV.  Regular rate and rhythm, no JVD, rub or murmur. Abdomen.  Soft, nontender, nondistended, BS positive. CNS.  Alert and oriented x3.  No focal  neurologic deficit. Extremities.  No edema, no cyanosis, pulses intact and symmetrical. Psychiatry.  Judgment and insight appears normal.   DVT prophylaxis: Heparin infusion Code Status: Full Family Communication: Discussed with patient Disposition Plan:  Status is: Inpatient  Remains inpatient appropriate because:Inpatient level of care appropriate due to severity of illness   Dispo: The patient is from: Home              Anticipated d/c is to: Home              Anticipated d/c date is: 1 day              Patient currently is not medically stable to d/c.   Difficult to place patient No              Level of care: Progressive Cardiac  Consultants:   Cardiology  Procedures:  Antimicrobials:   Data Reviewed: I have personally reviewed following labs and imaging studies  CBC: Recent Labs  Lab 02/12/20 1120 02/13/20 0658 02/14/20 0637  WBC 12.4* 9.1 9.5  NEUTROABS  --  6.7  --   HGB 12.5 11.1* 11.0*  HCT 41.9 37.6 36.1  MCV 81.0 81.4 81.7  PLT 317 264 409   Basic Metabolic Panel: Recent Labs  Lab 02/12/20 1120 02/13/20 0658 02/14/20 0637  NA 138 137 136  K 4.2 3.7 4.4  CL 104 104 106  CO2 25 22 21*  GLUCOSE 122* 147* 111*  BUN 28* 25* 18  CREATININE 1.19* 1.14* 0.98  CALCIUM 9.0 8.3* 8.3*  MG  --  1.9  --    GFR: Estimated Creatinine Clearance: 53.2 mL/min (by C-G formula based on SCr of 0.98 mg/dL). Liver Function Tests: Recent Labs  Lab 02/13/20 0658  AST 37  ALT 40  ALKPHOS 94  BILITOT 0.8  PROT 5.6*  ALBUMIN 2.9*   No results for input(s): LIPASE, AMYLASE in the last 168 hours. No results for input(s): AMMONIA in the last 168 hours. Coagulation Profile: Recent Labs  Lab 02/12/20 1136 02/13/20 0658  INR 1.3* 1.2   Cardiac Enzymes: No results for input(s): CKTOTAL, CKMB, CKMBINDEX, TROPONINI in the last 168 hours. BNP (last 3 results) No results for input(s): PROBNP in the last 8760 hours. HbA1C: No results for input(s): HGBA1C in  the last 72 hours. CBG: No results for input(s): GLUCAP in the last 168 hours. Lipid Profile: Recent Labs    02/13/20 0658  CHOL 106  HDL 36*  LDLCALC 53  TRIG 87  CHOLHDL 2.9   Thyroid Function Tests: No results for input(s): TSH, T4TOTAL, FREET4, T3FREE, THYROIDAB in the last 72 hours. Anemia Panel: No results for input(s): VITAMINB12, FOLATE, FERRITIN, TIBC, IRON, RETICCTPCT in the last 72 hours. Sepsis Labs: No results for input(s): PROCALCITON, LATICACIDVEN in the last 168 hours.  Recent Results (from the past 240 hour(s))  Resp Panel by RT-PCR (Flu A&B, Covid) Nasopharyngeal Swab     Status: None   Collection Time: 02/12/20 11:30 AM   Specimen: Nasopharyngeal Swab; Nasopharyngeal(NP) swabs in vial transport medium  Result Value Ref Range Status   SARS Coronavirus 2 by RT PCR NEGATIVE NEGATIVE Final    Comment: (NOTE) SARS-CoV-2 target nucleic acids are NOT DETECTED.  The SARS-CoV-2 RNA is generally detectable in upper respiratory specimens during the acute phase of infection. The lowest concentration of SARS-CoV-2 viral copies this assay can detect is 138 copies/mL. A negative result does not preclude SARS-Cov-2 infection and should not be used as the sole basis for treatment or other patient management decisions. A negative result may occur with  improper specimen collection/handling, submission of specimen other than nasopharyngeal swab, presence of viral mutation(s) within the areas targeted by this assay, and inadequate number of viral copies(<138 copies/mL). A negative result must be combined with clinical observations, patient history, and epidemiological information. The expected result is Negative.  Fact Sheet for Patients:  EntrepreneurPulse.com.au  Fact Sheet for Healthcare Providers:  IncredibleEmployment.be  This test is no t yet approved or cleared by the [place] FDA and  has been authorized for detection  and/or diagnosis of SARS-CoV-2 by FDA under an Emergency Use Authorization (EUA). This EUA will remain  in effect (meaning this test can be used) for the duration of the COVID-19 declaration under Section 564(b)(1) of the Act, 21 U.S.C.section 360bbb-3(b)(1), unless the authorization is terminated  or revoked sooner.       Influenza A by PCR NEGATIVE NEGATIVE Final   Influenza B by PCR NEGATIVE NEGATIVE Final    Comment: (NOTE) The Xpert Xpress SARS-CoV-2/FLU/RSV plus assay is intended as an aid in the diagnosis of influenza from Nasopharyngeal swab specimens and should not be used as a sole basis for treatment. Nasal washings and aspirates are unacceptable for Xpert Xpress SARS-CoV-2/FLU/RSV testing.  Fact Sheet for Patients: EntrepreneurPulse.com.au  Fact Sheet for Healthcare Providers: IncredibleEmployment.be  This test is not yet approved or cleared by the [place] FDA and has been authorized for detection and/or diagnosis of SARS-CoV-2 by FDA under an Emergency Use Authorization (EUA). This EUA will remain in effect (meaning this test can be used) for the duration of the COVID-19 declaration under Section 564(b)(1) of the Act, 21 U.S.C. section 360bbb-3(b)(1), unless the authorization is terminated or revoked.  Performed at [facility], [address], [place], [place] [address]      Radiology Studies: CARDIAC CATHETERIZATION  Result Date: 02/13/2020  Mid LAD lesion is 99% stenosed.  Post intervention, there is a 0% residual stenosis.  A stent was successfully placed.  Minimal disease in circumflex  Minimal disease in RCA  Successful PCI and stent of mid LAD  Conclusion Severely depressed left ventricular function anterior apical septal akinesis EF less than 25% Successful cardiac cath Radial approach Large left main free of disease LAD large with 100% mid lesion thrombotic ulcerated Circumflex medium to large minor  irregularity RCA medium free of disease Intervention Successful PCI and stent of mid LAD with DES 2.5 x 15 mm resolute Onyx Questionable distal edge dissection Placement of a short 2.25 x 8 mm resolute Onyx to the distal edge Evidence of potential distal embolization Possible no reflow Treated with nitro with significant improvement in flow Patient will need extensive heart failure therapy and evaluation for ischemic cardiomyopathy  ECHOCARDIOGRAM COMPLETE  Result Date: 02/13/2020    ECHOCARDIOGRAM REPORT   Patient Name:   Kayla Maxwell Date of Exam: 02/12/2020 Medical Rec #:  [id number]      Height:       65.0 in Accession #:    [id number]     Weight:       177.8 lb Date of Birth:  [birth year]      BSA:  1.882 m Patient Age:    60 years       BP:           103/64 mmHg Patient Gender: F              HR:           105 bpm. Exam Location:  [facility] Procedure: 2D Echo, Cardiac Doppler, Color Doppler and Intracardiac            Opacification Agent Indications:     I21.3 STEMI  History:         Patient has prior history of Echocardiogram examinations, most                  recent 06/07/2015. Risk Factors:Hypertension and Dyslipidemia.                  TIA. Sleep apnea. Atrial Fibrillation. Coronary artery disease.  Sonographer:     Wilford Sports Rodgers-Jones Referring Phys:  [id number] Vernelle Emerald Diagnosing Phys: Yolonda Kida MD IMPRESSIONS  1. Large area anterior/Apical/Septal/Lateral Hypokinesis.  2. Left ventricular ejection fraction, by estimation, is 30 to 35%. The left ventricle has moderately decreased function. The left ventricle demonstrates regional wall motion abnormalities (see scoring diagram/findings for description). Left ventricular  diastolic parameters were normal.  3. Right ventricular systolic function is mildly reduced. The right ventricular size is mildly enlarged.  4. The mitral valve is normal in structure. Trivial mitral valve regurgitation.  5. The aortic valve is normal in structure.  Aortic valve regurgitation is not visualized. Conclusion(s)/Recommendation(s): Findings consistent with ischemic cardiomyopathy. No left ventricular mural or apical thrombus/thrombi. FINDINGS  Left Ventricle: Left ventricular ejection fraction, by estimation, is 30 to 35%. The left ventricle has moderately decreased function. The left ventricle demonstrates regional wall motion abnormalities. Definity contrast agent was given IV to delineate the left ventricular endocardial borders. The left ventricular internal cavity size was normal in size. There is no left ventricular hypertrophy. Left ventricular diastolic parameters were normal. Right Ventricle: The right ventricular size is mildly enlarged. No increase in right ventricular wall thickness. Right ventricular systolic function is mildly reduced. Left Atrium: Left atrial size was normal in size. Right Atrium: Right atrial size was normal in size. Pericardium: There is no evidence of pericardial effusion. Mitral Valve: The mitral valve is normal in structure. Trivial mitral valve regurgitation. Tricuspid Valve: The tricuspid valve is normal in structure. Tricuspid valve regurgitation is mild. Aortic Valve: The aortic valve is normal in structure. Aortic valve regurgitation is not visualized. Pulmonic Valve: The pulmonic valve was normal in structure. Pulmonic valve regurgitation is not visualized. Aorta: The ascending aorta was not well visualized. IAS/Shunts: No atrial level shunt detected by color flow Doppler. Additional Comments: Large area anterior/Apical/Septal/Lateral Hypokinesis.  LEFT VENTRICLE PLAX 2D LVIDd:         4.89 cm LVIDs:         3.13 cm LV PW:         1.25 cm LV IVS:        0.99 cm LVOT diam:     1.80 cm LV SV:         23 LV SV Index:   12 LVOT Area:     2.54 cm  RIGHT VENTRICLE            IVC RV Basal diam:  3.37 cm    IVC diam: 2.03 cm RV S prime:     6.47 cm/s TAPSE (M-mode): 0.8 cm LEFT  ATRIUM             Index       RIGHT ATRIUM            Index LA diam:        5.80 cm 3.08 cm/m  RA Area:     23.30 cm LA Vol (A2C):   68.2 ml 36.24 ml/m RA Volume:   75.90 ml  40.34 ml/m LA Vol (A4C):   66.5 ml 35.34 ml/m LA Biplane Vol: 74.5 ml 39.59 ml/m  AORTIC VALVE LVOT Vmax:   61.57 cm/s LVOT Vmean:  41.233 cm/s LVOT VTI:    0.089 m  AORTA Ao Root diam: 3.10 cm MV E velocity: 105.75 cm/s  TRICUSPID VALVE                             TR Peak grad:   25.8 mmHg                             TR Vmax:        254.00 cm/s                              SHUNTS                             Systemic VTI:  0.09 m                             Systemic Diam: 1.80 cm Dwayne Prince Rome MD Electronically signed by Yolonda Kida MD Signature Date/Time: 02/13/2020/8:01:07 AM    Final     Scheduled Meds: . aspirin  81 mg Oral Daily  . dapagliflozin propanediol  5 mg Oral Daily  . dextromethorphan-guaiFENesin  1 tablet Oral BID  . FLUoxetine  20 mg Oral Daily  . lisinopril  2.5 mg Oral Daily  . melatonin  5 mg Oral QHS  . metoprolol tartrate  50 mg Oral BID  . midodrine  2.5 mg Oral TID WC  . pantoprazole  40 mg Oral Daily  . rivaroxaban  20 mg Oral Daily  . rosuvastatin  20 mg Oral Daily  . sodium chloride flush  10 mL Intravenous Q12H  . sodium chloride flush  3 mL Intravenous Q12H  . sodium chloride flush  3 mL Intravenous Q12H  . ticagrelor  90 mg Oral BID   Continuous Infusions: . sodium chloride       LOS: 2 days   Time spent: 30 minutes.  Lorella Nimrod, MD [facility]  If 7PM-7AM, please contact night-coverage [email or web address]  02/14/2020, 5:45 PM   This record has been created using Systems analyst. Errors have been sought and corrected,but may not always be located. Such creation errors do not reflect on the standard of care.

## 2020-02-14 NOTE — Progress Notes (Addendum)
[facility] Cardiology    SUBJECTIVE: Patient states she feels okay Atiyeh has some mild shortness of breath denies any pain Olheiser has some bruising of her right wrist denies any palpitations or tachycardia denies any leg edema Przybylski weak somewhat fatigue has not ambulated [place] has a decent appetite   Vitals:   02/13/20 1950 02/14/20 0506 02/14/20 0522 02/14/20 0724  BP: (!) 102/46  116/68 95/72  Pulse: (!) 107  78 97  Resp: 18  18 18   Temp: 98.7 F (37.1 C)  97.8 F (36.6 C) 98 F (36.7 C)  TempSrc: Oral  Oral Oral  SpO2: 97%  99% 97%  Weight:  81.7 kg    Height:         Intake/Output Summary (Last 24 hours) at 02/14/2020 9937 Last data filed at 02/14/2020 0505 Gross per 24 hour  Intake 1177.83 ml  Output 1100 ml  Net 77.83 ml      PHYSICAL EXAM  General: Well developed, well nourished, in no acute distress HEENT:  Normocephalic and atramatic Neck:  No JVD.  Lungs: Clear bilaterally to auscultation and percussion. Heart: Irregular irregular.  S3 normal S1 and S2 without gallops or murmurs.  Abdomen: Bowel sounds are positive, abdomen soft and non-tender  Msk:  Back normal, normal gait. Normal strength and tone for age. Extremities: No clubbing, cyanosis or edema.  Mild bruising of the right wrist from access site Neuro: Alert and oriented X 3. Psych:  Good affect, responds appropriately   LABS: Basic Metabolic Panel: Recent Labs    02/13/20 0658 02/14/20 0637  NA 137 136  K 3.7 4.4  CL 104 106  CO2 22 21*  GLUCOSE 147* 111*  BUN 25* 18  CREATININE 1.14* 0.98  CALCIUM 8.3* 8.3*  MG 1.9  --    Liver Function Tests: Recent Labs    02/13/20 0658  AST 37  ALT 40  ALKPHOS 94  BILITOT 0.8  PROT 5.6*  ALBUMIN 2.9*   No results for input(s): LIPASE, AMYLASE in the last 72 hours. CBC: Recent Labs    02/13/20 0658 02/14/20 0637  WBC 9.1 9.5  NEUTROABS 6.7  --   HGB 11.1* 11.0*  HCT 37.6 36.1  MCV 81.4 81.7  PLT 264 292   Cardiac Enzymes: No results  for input(s): CKTOTAL, CKMB, CKMBINDEX, TROPONINI in the last 72 hours. BNP: Invalid input(s): POCBNP D-Dimer: No results for input(s): DDIMER in the last 72 hours. Hemoglobin A1C: No results for input(s): HGBA1C in the last 72 hours. Fasting Lipid Panel: Recent Labs    02/13/20 0658  CHOL 106  HDL 36*  LDLCALC 53  TRIG 87  CHOLHDL 2.9   Thyroid Function Tests: No results for input(s): TSH, T4TOTAL, T3FREE, THYROIDAB in the last 72 hours.  Invalid input(s): FREET3 Anemia Panel: No results for input(s): VITAMINB12, FOLATE, FERRITIN, TIBC, IRON, RETICCTPCT in the last 72 hours.  DG Chest 2 View  Result Date: 02/12/2020 CLINICAL DATA:  Shortness of breath and chest pain. EXAM: CHEST - 2 VIEW COMPARISON:  06/09/2015 FINDINGS: Lungs are hyperexpanded. The lungs are clear without focal pneumonia, edema, pneumothorax or pleural effusion. Interstitial markings are diffusely coarsened with chronic features. Cardiopericardial silhouette is at upper limits of normal for size. Telemetry leads overlie the chest. IMPRESSION: Hyperexpansion without acute cardiopulmonary findings. Electronically Signed   By: Misty Stanley M.D.   On: 02/12/2020 12:20   CARDIAC CATHETERIZATION  Result Date: 02/13/2020  Mid LAD lesion is 99% stenosed.  Post intervention, there  is a 0% residual stenosis.  A stent was successfully placed.  Minimal disease in circumflex  Minimal disease in RCA  Successful PCI and stent of mid LAD  Conclusion Severely depressed left ventricular function anterior apical septal akinesis EF less than 25% Successful cardiac cath Radial approach Large left main free of disease LAD large with 100% mid lesion thrombotic ulcerated Circumflex medium to large minor irregularity RCA medium free of disease Intervention Successful PCI and stent of mid LAD with DES 2.5 x 15 mm resolute Onyx Questionable distal edge dissection Placement of a short 2.25 x 8 mm resolute Onyx to the distal edge Evidence of  potential distal embolization Possible no reflow Treated with nitro with significant improvement in flow Patient will need extensive heart failure therapy and evaluation for ischemic cardiomyopathy  ECHOCARDIOGRAM COMPLETE  Result Date: 02/13/2020    ECHOCARDIOGRAM REPORT   Patient Name:   Kayla Maxwell Bobeck Date of Exam: 02/12/2020 Medical Rec #:  [id number]      Height:       65.0 in Accession #:    [id number]     Weight:       177.8 lb Date of Birth:  [birth year]      BSA:          1.882 m Patient Age:    75 years       BP:           103/64 mmHg Patient Gender: F              HR:           105 bpm. Exam Location:  [facility] Procedure: 2D Echo, Cardiac Doppler, Color Doppler and Intracardiac            Opacification Agent Indications:     I21.3 STEMI  History:         Patient has prior history of Echocardiogram examinations, most                  recent 06/07/2015. Risk Factors:Hypertension and Dyslipidemia.                  TIA. Sleep apnea. Atrial Fibrillation. Coronary artery disease.  Sonographer:     Wilford Sports Rodgers-Jones Referring Phys:  [id number] Vernelle Emerald Diagnosing Phys: Yolonda Kida MD IMPRESSIONS  1. Large area anterior/Apical/Septal/Lateral Hypokinesis.  2. Left ventricular ejection fraction, by estimation, is 30 to 35%. The left ventricle has moderately decreased function. The left ventricle demonstrates regional wall motion abnormalities (see scoring diagram/findings for description). Left ventricular  diastolic parameters were normal.  3. Right ventricular systolic function is mildly reduced. The right ventricular size is mildly enlarged.  4. The mitral valve is normal in structure. Trivial mitral valve regurgitation.  5. The aortic valve is normal in structure. Aortic valve regurgitation is not visualized. Conclusion(s)/Recommendation(s): Findings consistent with ischemic cardiomyopathy. No left ventricular mural or apical thrombus/thrombi. FINDINGS  Left Ventricle: Left ventricular ejection  fraction, by estimation, is 30 to 35%. The left ventricle has moderately decreased function. The left ventricle demonstrates regional wall motion abnormalities. Definity contrast agent was given IV to delineate the left ventricular endocardial borders. The left ventricular internal cavity size was normal in size. There is no left ventricular hypertrophy. Left ventricular diastolic parameters were normal. Right Ventricle: The right ventricular size is mildly enlarged. No increase in right ventricular wall thickness. Right ventricular systolic function is mildly reduced. Left Atrium: Left atrial size was normal in size. Right Atrium: Right  atrial size was normal in size. Pericardium: There is no evidence of pericardial effusion. Mitral Valve: The mitral valve is normal in structure. Trivial mitral valve regurgitation. Tricuspid Valve: The tricuspid valve is normal in structure. Tricuspid valve regurgitation is mild. Aortic Valve: The aortic valve is normal in structure. Aortic valve regurgitation is not visualized. Pulmonic Valve: The pulmonic valve was normal in structure. Pulmonic valve regurgitation is not visualized. Aorta: The ascending aorta was not well visualized. IAS/Shunts: No atrial level shunt detected by color flow Doppler. Additional Comments: Large area anterior/Apical/Septal/Lateral Hypokinesis.  LEFT VENTRICLE PLAX 2D LVIDd:         4.89 cm LVIDs:         3.13 cm LV PW:         1.25 cm LV IVS:        0.99 cm LVOT diam:     1.80 cm LV SV:         23 LV SV Index:   12 LVOT Area:     2.54 cm  RIGHT VENTRICLE            IVC RV Basal diam:  3.37 cm    IVC diam: 2.03 cm RV S prime:     6.47 cm/s TAPSE (M-mode): 0.8 cm LEFT ATRIUM             Index       RIGHT ATRIUM           Index LA diam:        5.80 cm 3.08 cm/m  RA Area:     23.30 cm LA Vol (A2C):   68.2 ml 36.24 ml/m RA Volume:   75.90 ml  40.34 ml/m LA Vol (A4C):   66.5 ml 35.34 ml/m LA Biplane Vol: 74.5 ml 39.59 ml/m  AORTIC VALVE LVOT Vmax:    61.57 cm/s LVOT Vmean:  41.233 cm/s LVOT VTI:    0.089 m  AORTA Ao Root diam: 3.10 cm MV E velocity: 105.75 cm/s  TRICUSPID VALVE                             TR Peak grad:   25.8 mmHg                             TR Vmax:        254.00 cm/s                              SHUNTS                             Systemic VTI:  0.09 m                             Systemic Diam: 1.80 cm Simaya Lumadue D Wildon Cuevas MD Electronically signed by Yolonda Kida MD Signature Date/Time: 02/13/2020/8:01:07 AM    Final      Echo severely depressed left ventricular function large area of anterior apical septal akinesis EF less than 30-35 % consistent with an ischemic cardiomyopathy  TELEMETRY: Atrial fibrillation nonspecific ST-T changes Q waves anterior laterally persistent ST elevation anterior laterally rate of about 100  ASSESSMENT AND PLAN:  Principal Problem:   STEMI (ST elevation myocardial infarction) ([place]) Active Problems:  Gastro-esophageal reflux disease without esophagitis   Mixed hyperlipidemia   Chronic atrial fibrillation (HCC)   Benign essential HTN Ischemic cardiomyopathy severe EF less than 25% Status post PCI and stent to mid LAD DES Obesity History of CVA Hypotension  Plan Agree with telemetry Follow-up EKG progression and evolution Increase activity consider physical therapy start ambulating in the hall Discontinue Aggrastat heparin Recommend aspirin 81 mg a day Brilinta 90 bid and Xarelto 20 Resume Xarelto 20 mg once a day Recommend discontinuing aspirin after 2 to 4 weeks Consider switching from Brilinta to Plavix after above 4 weeks Recommend a LifeVest prior to discharge Patient should be referred to EP for possible AICD in 3 months if LV function does not improve above 35% Consider low-dose midodrine therapy for blood pressure support Recommend heart failure therapy beta-blockers ACE ARB or Entresto consider starting spironolactone diuretics as needed  Consider amiodarone for atrial  fibrillation rhythm management to help with deficiency with depressed left ventricular function Patient should be referred to cardiac rehab Anticipate discharge within the next 24 to 48 hours the patient remained stable  Discharge cardiac medications Lisinopril 2.5 daily Metoprolol tartrate 50 mg twice a day Brilinta 90 mg twice a day Aspirin 81 mg x 4 weeks Xarelto 20 mg daily Midodrine 2.5 mg     3 times a day Lasix 20 mg daily as needed shortness of breath edema Consider starting Farxiga 5 mg daily LifeVest for 90 days  Yolonda Kida, MD 02/14/2020 8:23 AM

## 2020-02-14 NOTE — TOC Initial Note (Signed)
Transition of Care [facility]) - Initial/Assessment Note    Patient Details  Name: Kayla Maxwell MRN: [id number] Date of Birth: 29-May-1945  Transition of [facility]) CM/SW Contact:    Beverly Sessions, RN Phone Number: 02/14/2020, 3:47 PM  Clinical Narrative:                 Patient admitted from home with STEMI Patient states that she lives at home alone Daughter lives locally for support  PCP [facility]  Baseline is indpendent and drives.   Denies issues obtaining medications  Dorian Pod with [facility] [phone] to come to the bedside and fit patient for life vest tomorrow by 10 am Patient agreeable to home health services.  States she does not have a preference of home health agency.  Referral made to [facility] with Alvis Lemmings for RN and PT  Expected Discharge Plan: [place] Barriers to Discharge: Continued Medical Work up   Patient Goals and CMS Choice     Choice offered to / list presented to : Patient  Expected Discharge Plan and Services Expected Discharge Plan: [place] Acute Care Choice: [place] arrangements for the past 2 months: Single Family Home                 DME Arranged: Life vest DME Agency: [place] Date DME Agency Contacted: 02/14/20   Representative spoke with at DME Agency: [place]: RN,PT [place] Agency: [place] Date [facility] Agency Contacted: 02/14/20   Representative spoke with at [place]: Tommi Rumps  Prior Living Arrangements/Services Living arrangements for the past 2 months: Single Family Home Lives with:: Self Patient language and need for interpreter reviewed:: Yes Do you feel safe going back to the place where you live?: Yes      Need for Family Participation in Patient Care: Yes (Comment) Care giver support system in place?: Yes (comment) Current home services: DME Criminal Activity/Legal Involvement Pertinent to Current Situation/Hospitalization: No - Comment as  needed  Activities of Daily Living Home Assistive Devices/Equipment: None ADL Screening (condition at time of admission) Patient's cognitive ability adequate to safely complete daily activities?: Yes Is the patient deaf or have difficulty hearing?: No Does the patient have difficulty seeing, even when wearing glasses/contacts?: No Does the patient have difficulty concentrating, remembering, or making decisions?: No Patient able to express need for assistance with ADLs?: Yes Does the patient have difficulty dressing or bathing?: No Independently performs ADLs?: Yes (appropriate for developmental age) Does the patient have difficulty walking or climbing stairs?: No Weakness of Legs: None Weakness of Arms/Hands: None  Permission Sought/Granted                  Emotional Assessment       Orientation: : Oriented to Self,Oriented to Place,Oriented to  Time,Oriented to Situation      Admission diagnosis:  STEMI (ST elevation myocardial infarction) ([place]) [I21.3] Myocardial infarction, unspecified MI type, unspecified artery ([place]) [I21.9] Patient Active Problem List   Diagnosis Date Noted  . STEMI (ST elevation myocardial infarction) ([place]) 02/12/2020  . Atheroembolism of foot, left ([place]) 01/11/2019  . Venous insufficiency of both lower extremities 11/02/2017  . Pain and swelling of lower leg 06/20/2017  . Benign essential HTN 11/01/2016  . SOBOE (shortness of breath on exertion) 11/01/2016  . Gastroenteritis 08/16/2016  . Bilateral carotid artery stenosis 03/12/2016  . Fine tremor 03/12/2016  . PAD (peripheral artery disease) (  [place]) 01/14/2016  . Atherosclerosis of native arteries of extremity with rest pain ([place]) 11/10/2015  . Cough 06/09/2015  . Abnormal blood sugar 04/11/2015  . Anxiety 11/19/2014  . PTSD (post-traumatic stress disorder) 10/29/2014  . Allergic rhinitis 09/05/2014  . Mixed hyperlipidemia 09/05/2014  . Mild memory disturbance 09/05/2014  . H/O stroke  without residual deficits 09/05/2014  . Edema 09/05/2014  . Spinal stenosis, lumbar 07/04/2014  . MI (mitral incompetence) 05/08/2014  . TI (tricuspid incompetence) 05/08/2014  . Chronic atrial fibrillation ([place]) 05/08/2014  . History of tobacco abuse 04/29/2014  . Gastro-esophageal reflux disease without esophagitis 04/29/2014  . Obstructive apnea 04/29/2014  . Atrial fibrillation by electrocardiography ([place]) 04/29/2014  . Neuritis or radiculitis due to rupture of lumbar intervertebral disc 09/21/2013  . DDD (degenerative disc disease), lumbar 08/24/2013  . Bursitis, trochanteric 06/18/2013  . Nerve root inflammation 06/18/2013  . Arthralgia of hip 06/04/2013  . Calculus of gallbladder with other cholecystitis, without mention of obstruction 04/27/2012   PCP:  Mar Daring, PA-C Pharmacy:   [id number] [place], [place] [address] [address] Phone: [phone] Fax: [phone]     Social Determinants of Health (SDOH) Interventions    Readmission Risk Interventions No flowsheet data found.

## 2020-02-15 DIAGNOSIS — I482 Chronic atrial fibrillation, unspecified: Secondary | ICD-10-CM | POA: Diagnosis not present

## 2020-02-15 DIAGNOSIS — I219 Acute myocardial infarction, unspecified: Secondary | ICD-10-CM

## 2020-02-15 DIAGNOSIS — I213 ST elevation (STEMI) myocardial infarction of unspecified site: Secondary | ICD-10-CM | POA: Diagnosis not present

## 2020-02-15 DIAGNOSIS — I10 Essential (primary) hypertension: Secondary | ICD-10-CM | POA: Diagnosis not present

## 2020-02-15 LAB — POCT ACTIVATED CLOTTING TIME
Activated Clotting Time: 238 seconds
Activated Clotting Time: 279 seconds

## 2020-02-15 MED ORDER — BISACODYL 10 MG RE SUPP
10.0000 mg | Freq: Once | RECTAL | Status: AC
  Administered 2020-02-15: 10 mg via RECTAL
  Filled 2020-02-15: qty 1

## 2020-02-15 MED ORDER — TICAGRELOR 90 MG PO TABS: 90.0000 mg | ORAL_TABLET | Freq: Two times a day (BID) | ORAL | 11 refills | Status: DC

## 2020-02-15 MED ORDER — NITROGLYCERIN 0.4 MG SL SUBL: 0.4000 mg | SUBLINGUAL_TABLET | SUBLINGUAL | 12 refills | Status: AC | PRN

## 2020-02-15 MED ORDER — MIDODRINE HCL 2.5 MG PO TABS: 2.5000 mg | ORAL_TABLET | Freq: Three times a day (TID) | ORAL | 1 refills | Status: DC

## 2020-02-15 MED ORDER — ROSUVASTATIN CALCIUM 20 MG PO TABS: 20.0000 mg | ORAL_TABLET | Freq: Every day | ORAL | 1 refills | Status: AC

## 2020-02-15 MED ORDER — DAPAGLIFLOZIN PROPANEDIOL 5 MG PO TABS: 5.0000 mg | ORAL_TABLET | Freq: Every day | ORAL | 2 refills | Status: DC

## 2020-02-15 MED ORDER — MONTELUKAST SODIUM 10 MG PO TABS: 10.0000 mg | ORAL_TABLET | Freq: Every day | ORAL | 0 refills | Status: DC | PRN

## 2020-02-15 MED ORDER — FUROSEMIDE 20 MG PO TABS: 20.0000 mg | ORAL_TABLET | Freq: Every day | ORAL | 2 refills | Status: AC | PRN

## 2020-02-15 NOTE — Consult Note (Signed)
   Heart Failure Nurse Navigator Note  HFrEF 30 to 35%.  The left ventricle demonstrates regional wall motion abnormalities.  Right ventricular systolic function is mildly reduced.  The right ventricular size is mildly enlarged.  She presented to the with complaints of chest pain, weakness and shortness of that started approximately 5 days before she presented.  Her EKG was concerning for STEMI and troponin was elevated 24,772.  Comorbidities:  Anemia Paroxysmal atrial fibrillation Coronary artery disease Hyperlipidemia Hypertension Sleep apnea  Labs:  Sodium 136, potassium 4.4, chloride 106, CO2 21, BUN 18, creatinine 0.98, total cholesterol 106, triglycerides 87, HDL 36, LDL 53, Intake 1171 Output 1100 Weight 81 kg  Medications:  Aspirin 81 mg daily Farxiga 5 mg daily Furosemide 20 mg daily as needed Lisinopril 2.5 mg daily Metoprolol tartrate 50 mg 2 times a day Midodrine 2.5 mg 3 times a day with meals Xarelto 20 mg daily Crestor 20 mg daily Brilinta 90 mg 2 times a day   Assessment:  General-she is awake and alert lying in bed in no acute distress.  States that she [place]au[place]feind gets short of breath with activity.  HEENT-pupils are equal, sclera nonicteric, normocephalic.  No JVD.  Cardiac-heart tones of irregular rate and rhythm, no murmurs rubs or gallops appreciated.  Chest-breath sounds are clear to posterior auscultation.  Abdomen-soft nontender  Musculoskeletal-no lower extremity edema noted, pedal pulses are palpable at 2+  Psych-she is pleasant and appropriate makes good eye contact.  Neurologic-speech is clear she moves all extremities without difficulty.    Had initially try to meet with the patient and her sister yesterday but with complaints of increasing shortness of breath and weakness having just returned from ambulating from the bathroom I told him that I would come back at another time.  Spoke with her today and offered the meds to beds program,  to help with  obtaining her GDMT medications but she states that she has Medicare and [employer] and can obtain her medications for 90 days at a time so she is interested in pursuing her medications with that programs.   Discussed heart failure and how she is going to take care of herself.  She states she uses salt but is going to remove that.  Also discussed weighing daily, recording and what to report to doctor.  Importance of fluid restriction, changes in symptoms to report.  She was given heart failure booklet, zone magnet and info about the outpatient heart failure clinic and appointment.   Pricilla Riffle RN, CHFN

## 2020-02-15 NOTE — Plan of Care (Signed)

## 2020-02-15 NOTE — Care Management Important Message (Signed)
Important Message  Patient Details  Name: Kayla Maxwell MRN: [id number] Date of Birth: 14-Nov-1945   Medicare Important Message Given:  Yes     Dannette Barbara 02/15/2020, 1:11 PM

## 2020-02-15 NOTE — Discharge Summary (Signed)
Physician Discharge Summary  Kayla Maxwell WUJ:[id number] DOB: May 31, 1945 DOA: 02/12/2020  PCP: Mar Daring, PA-C  Admit date: 02/12/2020 Discharge date: 02/15/2020  Admitted From: Home Disposition: Home  Recommendations for Outpatient Follow-up:  1. Follow up with PCP in 1-2 weeks 2. Follow-up with cardiology 3. Please obtain BMP/CBC in one week 4. Please follow up on the following pending results: None  Home Health: No Equipment/Devices: LifeVest Discharge Condition: Stable CODE STATUS: Full Diet recommendation: Heart Healthy / Carb Modified   Brief/Interim Summary: 75 year old female with past medical history of coronary artery disease, previous CVA, atrial fibrillation on Xarelto, gastroesophageal reflux disease, hypertension, hyperlipidemia who presents to [facility] emergency department with complaints of weakness.  Patient explains that approximately 5 days ago at 1:30 AM she was sitting on her couch at home when she suddenly began to experience chest discomfort. Patient describes chest discomfort as midsternal in location, severe in intensity, pressure-like in quality and associated with shortness of breath. This discomfort had no alleviating factors. Discomfort lasted for approximately 3 hours before slowly improving.  The following day, patient states that her chest discomfort had resolved but she noticed that she was feeling extremely weak and short of breath. The shortness of breath and weakness was severe and worse with any exertion whatsoever. She denies fevers, sick contacts, recent travel, dysuria, vomiting, cough or confirmed contact with COVID-19 infection.   The symptoms continuedto persist for several days until the patient eventually presented to [facility] emergency department for evaluation.  She was found to have ST elevation in lateral leads and markedly elevated troponin.  Code STEMI was called but due to her symptoms going on for many days it was decided  to do further work-up with echocardiogram followed by cardiac catheterization. Echocardiogram with lateral wall motion abnormalities and reduced EF to 30 to 35%. Cardiac catheterization with 90% blockage of mid LAD which was stented successfully, and in catheterization EF was 20 to 25%. Patient was started on DAPT along with metoprolol and high intensity statin. She was also given a LifeVest with a plan to repeat echocardiogram in 36-month and if there is no improvement in EF then she will require AICD.  Patient has an history of chronic atrial fibrillation which was rate controlled on metoprolol.  She will resume her home dose of Xarelto.  She will continue with rest of her medications and follow-up with cardiology and primary care provider for further management.  Discharge Diagnoses:  Principal Problem:   Myocardial infarction [facility]) Active Problems:   Gastro-esophageal reflux disease without esophagitis   Mixed hyperlipidemia   Chronic atrial fibrillation (HCC)   Benign essential HTN  Discharge Instructions  Discharge Instructions    AMB Referral to Cardiac Rehabilitation - Phase II   Complete by: As directed    Diagnosis:  STEMI Heart Failure (see criteria below if ordering Phase II) Coronary Stents     Heart Failure Type: Chronic Systolic   After initial evaluation and assessments completed: Virtual Based Care may be provided alone or in conjunction with Phase 2 Cardiac Rehab based on patient barriers.: Yes   Diet - low sodium heart healthy   Complete by: As directed    Discharge instructions   Complete by: As directed    It was pleasure taking care of you. You are being provided with LifeVest, use as directed. Follow-up closely with your cardiologist for further recommendations and management. Your cardiologist added some new medications and discontinued your prior level statin as you know need higher  intensity, please take your medications as directed.   Increase activity  slowly   Complete by: As directed    No wound care   Complete by: As directed      Allergies as of 02/15/2020      Reactions   Codeine Other (See Comments)   Makes her feel "crazy"   Penicillins Swelling   Did it involve swelling of the face/tongue/throat, SOB, or low BP? Unknown Did it involve sudden or severe rash/hives, skin peeling, or any reaction on the inside of your mouth or nose? No Did you need to seek medical attention at a hospital or doctor's office? Unknown When did it last happen?Childhood allergy If all above answers are "NO", may proceed with cephalosporin use.   Latex    Adhesive [tape] Hives   (Latex)   Sulfa Antibiotics Nausea Only, Rash      Medication List    STOP taking these medications   lovastatin 40 MG tablet Commonly known as: MEVACOR   ondansetron 4 MG tablet Commonly known as: Zofran     TAKE these medications   albuterol 108 (90 Base) MCG/ACT inhaler Commonly known as: VENTOLIN HFA Inhale 2 puffs into the lungs every 6 (six) hours as needed for wheezing or shortness of breath.   aspirin 81 MG EC tablet Take 1 tablet (81 mg total) by mouth daily.   dapagliflozin propanediol 5 MG Tabs tablet Commonly known as: FARXIGA Take 1 tablet (5 mg total) by mouth daily.   FLUoxetine 20 MG capsule Commonly known as: PROZAC TAKE 1 CAPSULE BY MOUTH EVERY DAY What changed: how much to take   furosemide 20 MG tablet Commonly known as: LASIX Take 1 tablet (20 mg total) by mouth daily as needed for fluid or edema.   gabapentin 300 MG capsule Commonly known as: NEURONTIN TAKE 1 CAPSULE BY MOUTH EVERYDAY AT BEDTIME What changed: See the new instructions.   HYDROcodone-acetaminophen 5-325 MG tablet Commonly known as: Norco Take 1-2 tablets by mouth every 8 (eight) hours as needed for moderate pain or severe pain.   lisinopril 2.5 MG tablet Commonly known as: ZESTRIL TAKE 1 TABLET BY MOUTH EVERY DAY   metoprolol tartrate 25 MG  tablet Commonly known as: LOPRESSOR TAKE 2 TABLETS BY MOUTH TWICE A DAY   midodrine 2.5 MG tablet Commonly known as: PROAMATINE Take 1 tablet (2.5 mg total) by mouth 3 (three) times daily with meals.   montelukast 10 MG tablet Commonly known as: SINGULAIR Take 1 tablet (10 mg total) by mouth daily as needed (allergies).   nitroGLYCERIN 0.4 MG SL tablet Commonly known as: NITROSTAT Place 1 tablet (0.4 mg total) under the tongue every 5 (five) minutes x 3 doses as needed for chest pain.   omeprazole 40 MG capsule Commonly known as: PRILOSEC TAKE 1 CAPSULE BY MOUTH EVERY DAY What changed: how much to take   ondansetron 4 MG disintegrating tablet Commonly known as: Zofran ODT Take 1 tablet (4 mg total) by mouth every 8 (eight) hours as needed.   rivaroxaban 20 MG Tabs tablet Commonly known as: XARELTO Take 20 mg by mouth daily with supper.   rosuvastatin 20 MG tablet Commonly known as: CRESTOR Take 1 tablet (20 mg total) by mouth daily.   ticagrelor 90 MG Tabs tablet Commonly known as: BRILINTA Take 1 tablet (90 mg total) by mouth 2 (two) times daily.            Durable Medical Equipment  (From admission, onward)  Start     Ordered   02/14/20 0831  For home use only DME Vest life vest  Once        02/14/20 8338          Follow-up Information    Corey Skains, MD Follow up in 4 week(s).   Specialty: Cardiology Contact information: [address] [facility] [place] [address] [phone]        Mar Daring, [place]. Schedule an appointment as soon as possible for a visit.   Specialty: Family Medicine Contact information: [place] [address] [phone]              Allergies  Allergen Reactions  . Codeine Other (See Comments)    Makes her feel "crazy"  . Penicillins Swelling    Did it involve swelling of the face/tongue/throat, SOB, or low BP? Unknown Did it  involve sudden or severe rash/hives, skin peeling, or any reaction on the inside of your mouth or nose? No Did you need to seek medical attention at a hospital or doctor's office? Unknown When did it last happen?Childhood allergy If all above answers are "NO", may proceed with cephalosporin use.   . Latex   . Adhesive [Tape] Hives    (Latex)  . Sulfa Antibiotics Nausea Only and Rash    Consultations:  Cardiology  Procedures/Studies: DG Chest 2 View  Result Date: 02/12/2020 CLINICAL DATA:  Shortness of breath and chest pain. EXAM: CHEST - 2 VIEW COMPARISON:  06/09/2015 FINDINGS: Lungs are hyperexpanded. The lungs are clear without focal pneumonia, edema, pneumothorax or pleural effusion. Interstitial markings are diffusely coarsened with chronic features. Cardiopericardial silhouette is at upper limits of normal for size. Telemetry leads overlie the chest. IMPRESSION: Hyperexpansion without acute cardiopulmonary findings. Electronically Signed   By: Misty Stanley M.D.   On: 02/12/2020 12:20   CARDIAC CATHETERIZATION  Result Date: 02/13/2020  Mid LAD lesion is 99% stenosed.  Post intervention, there is a 0% residual stenosis.  A stent was successfully placed.  Minimal disease in circumflex  Minimal disease in RCA  Successful PCI and stent of mid LAD  Conclusion Severely depressed left ventricular function anterior apical septal akinesis EF less than 25% Successful cardiac cath Radial approach Large left main free of disease LAD large with 100% mid lesion thrombotic ulcerated Circumflex medium to large minor irregularity RCA medium free of disease Intervention Successful PCI and stent of mid LAD with DES 2.5 x 15 mm resolute Onyx Questionable distal edge dissection Placement of a short 2.25 x 8 mm resolute Onyx to the distal edge Evidence of potential distal embolization Possible no reflow Treated with nitro with significant improvement in flow Patient will need extensive heart failure  therapy and evaluation for ischemic cardiomyopathy  ECHOCARDIOGRAM COMPLETE  Result Date: 02/13/2020    ECHOCARDIOGRAM REPORT   Patient Name:   Kayla Maxwell Date of Exam: 02/12/2020 Medical Rec #:  [id number]      Height:       65.0 in Accession #:    [id number]     Weight:       177.8 lb Date of Birth:  1945-02-19      BSA:          1.882 m Patient Age:    28 years       BP:           103/64 mmHg Patient Gender: F  HR:           105 bpm. Exam Location:  [facility] Procedure: 2D Echo, Cardiac Doppler, Color Doppler and Intracardiac            Opacification Agent Indications:     I21.3 STEMI  History:         Patient has prior history of Echocardiogram examinations, most                  recent 06/07/2015. Risk Factors:Hypertension and Dyslipidemia.                  TIA. Sleep apnea. Atrial Fibrillation. Coronary artery disease.  Sonographer:     Wilford Sports Rodgers-Jones Referring Phys:  [id number] Vernelle Emerald Diagnosing Phys: Yolonda Kida MD IMPRESSIONS  1. Large area anterior/Apical/Septal/Lateral Hypokinesis.  2. Left ventricular ejection fraction, by estimation, is 30 to 35%. The left ventricle has moderately decreased function. The left ventricle demonstrates regional wall motion abnormalities (see scoring diagram/findings for description). Left ventricular  diastolic parameters were normal.  3. Right ventricular systolic function is mildly reduced. The right ventricular size is mildly enlarged.  4. The mitral valve is normal in structure. Trivial mitral valve regurgitation.  5. The aortic valve is normal in structure. Aortic valve regurgitation is not visualized. Conclusion(s)/Recommendation(s): Findings consistent with ischemic cardiomyopathy. No left ventricular mural or apical thrombus/thrombi. FINDINGS  Left Ventricle: Left ventricular ejection fraction, by estimation, is 30 to 35%. The left ventricle has moderately decreased function. The left ventricle demonstrates regional wall motion  abnormalities. Definity contrast agent was given IV to delineate the left ventricular endocardial borders. The left ventricular internal cavity size was normal in size. There is no left ventricular hypertrophy. Left ventricular diastolic parameters were normal. Right Ventricle: The right ventricular size is mildly enlarged. No increase in right ventricular wall thickness. Right ventricular systolic function is mildly reduced. Left Atrium: Left atrial size was normal in size. Right Atrium: Right atrial size was normal in size. Pericardium: There is no evidence of pericardial effusion. Mitral Valve: The mitral valve is normal in structure. Trivial mitral valve regurgitation. Tricuspid Valve: The tricuspid valve is normal in structure. Tricuspid valve regurgitation is mild. Aortic Valve: The aortic valve is normal in structure. Aortic valve regurgitation is not visualized. Pulmonic Valve: The pulmonic valve was normal in structure. Pulmonic valve regurgitation is not visualized. Aorta: The ascending aorta was not well visualized. IAS/Shunts: No atrial level shunt detected by color flow Doppler. Additional Comments: Large area anterior/Apical/Septal/Lateral Hypokinesis.  LEFT VENTRICLE PLAX 2D LVIDd:         4.89 cm LVIDs:         3.13 cm LV PW:         1.25 cm LV IVS:        0.99 cm LVOT diam:     1.80 cm LV SV:         23 LV SV Index:   12 LVOT Area:     2.54 cm  RIGHT VENTRICLE            IVC RV Basal diam:  3.37 cm    IVC diam: 2.03 cm RV S prime:     6.47 cm/s TAPSE (M-mode): 0.8 cm LEFT ATRIUM             Index       RIGHT ATRIUM           Index LA diam:        5.80 cm  3.08 cm/m  RA Area:     23.30 cm LA Vol (A2C):   68.2 ml 36.24 ml/m RA Volume:   75.90 ml  40.34 ml/m LA Vol (A4C):   66.5 ml 35.34 ml/m LA Biplane Vol: 74.5 ml 39.59 ml/m  AORTIC VALVE LVOT Vmax:   61.57 cm/s LVOT Vmean:  41.233 cm/s LVOT VTI:    0.089 m  AORTA Ao Root diam: 3.10 cm MV E velocity: 105.75 cm/s  TRICUSPID VALVE                              TR Peak grad:   25.8 mmHg                             TR Vmax:        254.00 cm/s                              SHUNTS                             Systemic VTI:  0.09 m                             Systemic Diam: 1.80 cm Dwayne Prince Rome MD Electronically signed by Yolonda Kida MD Signature Date/Time: 02/13/2020/8:01:07 AM    Final      Subjective: Patient was feeling better when seen today.  Ready to go home.  She was getting her LifeVest.  Daughter at bedside who has multiple questions which were answered.  Discharge Exam: Vitals:   02/15/20 0410 02/15/20 0751  BP: (!) 106/34 125/67  Pulse: 94 82  Resp: 18 18  Temp: 98.7 F (37.1 C) 98.3 F (36.8 C)  SpO2: 94% 95%   Vitals:   02/14/20 1942 02/15/20 0100 02/15/20 0410 02/15/20 0751  BP: 109/62  (!) 106/34 125/67  Pulse: 90  94 82  Resp: 18  18 18   Temp: 98.3 F (36.8 C)  98.7 F (37.1 C) 98.3 F (36.8 C)  TempSrc: Oral  Oral Oral  SpO2: 97%  94% 95%  Weight:  81 kg    Height:        General: Pt is alert, awake, not in acute distress Cardiovascular: RRR, S1/S2 +, no rubs, no gallops Respiratory: CTA bilaterally, no wheezing, no rhonchi Abdominal: Soft, NT, ND, bowel sounds + Extremities: no edema, no cyanosis   The results of significant diagnostics from this hospitalization (including imaging, microbiology, ancillary and laboratory) are listed below for reference.    Microbiology: Recent Results (from the past 240 hour(s))  Resp Panel by RT-PCR (Flu A&B, Covid) Nasopharyngeal Swab     Status: None   Collection Time: 02/12/20 11:30 AM   Specimen: Nasopharyngeal Swab; Nasopharyngeal(NP) swabs in vial transport medium  Result Value Ref Range Status   SARS Coronavirus 2 by RT PCR NEGATIVE NEGATIVE Final    Comment: (NOTE) SARS-CoV-2 target nucleic acids are NOT DETECTED.  The SARS-CoV-2 RNA is generally detectable in upper respiratory specimens during the acute phase of infection. The  lowest concentration of SARS-CoV-2 viral copies this assay can detect is 138 copies/mL. A negative result does not preclude SARS-Cov-2 infection and should not be used as the sole basis for treatment or  other patient management decisions. A negative result may occur with  improper specimen collection/handling, submission of specimen other than nasopharyngeal swab, presence of viral mutation(s) within the areas targeted by this assay, and inadequate number of viral copies(<138 copies/mL). A negative result must be combined with clinical observations, patient history, and epidemiological information. The expected result is Negative.  Fact Sheet for Patients:  EntrepreneurPulse.com.au  Fact Sheet for Healthcare Providers:  IncredibleEmployment.be  This test is no t yet approved or cleared by the [place] FDA and  has been authorized for detection and/or diagnosis of SARS-CoV-2 by FDA under an Emergency Use Authorization (EUA). This EUA will remain  in effect (meaning this test can be used) for the duration of the COVID-19 declaration under Section 564(b)(1) of the Act, 21 U.S.C.section 360bbb-3(b)(1), unless the authorization is terminated  or revoked sooner.       Influenza A by PCR NEGATIVE NEGATIVE Final   Influenza B by PCR NEGATIVE NEGATIVE Final    Comment: (NOTE) The Xpert Xpress SARS-CoV-2/FLU/RSV plus assay is intended as an aid in the diagnosis of influenza from Nasopharyngeal swab specimens and should not be used as a sole basis for treatment. Nasal washings and aspirates are unacceptable for Xpert Xpress SARS-CoV-2/FLU/RSV testing.  Fact Sheet for Patients: EntrepreneurPulse.com.au  Fact Sheet for Healthcare Providers: IncredibleEmployment.be  This test is not yet approved or cleared by the [place] FDA and has been authorized for detection and/or diagnosis of SARS-CoV-2 by FDA under  an Emergency Use Authorization (EUA). This EUA will remain in effect (meaning this test can be used) for the duration of the COVID-19 declaration under Section 564(b)(1) of the Act, 21 U.S.C. section 360bbb-3(b)(1), unless the authorization is terminated or revoked.  Performed at [facility], [address] [place], [place] [address]      Labs: BNP (last 3 results) Recent Labs    02/12/20 1120  BNP 604.5*   Basic Metabolic Panel: Recent Labs  Lab 02/12/20 1120 02/13/20 0658 02/14/20 0637  NA 138 137 136  K 4.2 3.7 4.4  CL 104 104 106  CO2 25 22 21*  GLUCOSE 122* 147* 111*  BUN 28* 25* 18  CREATININE 1.19* 1.14* 0.98  CALCIUM 9.0 8.3* 8.3*  MG  --  1.9  --    Liver Function Tests: Recent Labs  Lab 02/13/20 0658  AST 37  ALT 40  ALKPHOS 94  BILITOT 0.8  PROT 5.6*  ALBUMIN 2.9*   No results for input(s): LIPASE, AMYLASE in the last 168 hours. No results for input(s): AMMONIA in the last 168 hours. CBC: Recent Labs  Lab 02/12/20 1120 02/13/20 0658 02/14/20 0637  WBC 12.4* 9.1 9.5  NEUTROABS  --  6.7  --   HGB 12.5 11.1* 11.0*  HCT 41.9 37.6 36.1  MCV 81.0 81.4 81.7  PLT 317 264 292   Cardiac Enzymes: No results for input(s): CKTOTAL, CKMB, CKMBINDEX, TROPONINI in the last 168 hours. BNP: Invalid input(s): POCBNP CBG: No results for input(s): GLUCAP in the last 168 hours. D-Dimer No results for input(s): DDIMER in the last 72 hours. Hgb A1c No results for input(s): HGBA1C in the last 72 hours. Lipid Profile Recent Labs    02/13/20 0658  CHOL 106  HDL 36*  LDLCALC 53  TRIG 87  CHOLHDL 2.9   Thyroid function studies No results for input(s): TSH, T4TOTAL, T3FREE, THYROIDAB in the last 72 hours.  Invalid input(s): FREET3 Anemia work up No results for input(s): VITAMINB12, FOLATE,  FERRITIN, TIBC, IRON, RETICCTPCT in the last 72 hours. Urinalysis    Component Value Date/Time   COLORURINE AMBER (A) 08/07/2019 1604    APPEARANCEUR CLOUDY (A) 08/07/2019 1604   APPEARANCEUR Turbid (A) 10/29/2014 0000   LABSPEC 1.028 08/07/2019 1604   LABSPEC 1.012 11/09/2013 1155   PHURINE 5.0 08/07/2019 1604   GLUCOSEU NEGATIVE 08/07/2019 1604   GLUCOSEU Negative 11/09/2013 1155   HGBUR NEGATIVE 08/07/2019 1604   BILIRUBINUR NEGATIVE 08/07/2019 1604   BILIRUBINUR neg 10/29/2014 1103   BILIRUBINUR Negative 10/29/2014 0000   BILIRUBINUR Negative 11/09/2013 1155   KETONESUR 5 (A) 08/07/2019 1604   PROTEINUR 100 (A) 08/07/2019 1604   UROBILINOGEN 0.2 10/29/2014 1103   NITRITE NEGATIVE 08/07/2019 1604   LEUKOCYTESUR NEGATIVE 08/07/2019 1604   LEUKOCYTESUR Negative 11/09/2013 1155   Sepsis Labs Invalid input(s): PROCALCITONIN,  WBC,  LACTICIDVEN Microbiology Recent Results (from the past 240 hour(s))  Resp Panel by RT-PCR (Flu A&B, Covid) Nasopharyngeal Swab     Status: None   Collection Time: 02/12/20 11:30 AM   Specimen: Nasopharyngeal Swab; Nasopharyngeal(NP) swabs in vial transport medium  Result Value Ref Range Status   SARS Coronavirus 2 by RT PCR NEGATIVE NEGATIVE Final    Comment: (NOTE) SARS-CoV-2 target nucleic acids are NOT DETECTED.  The SARS-CoV-2 RNA is generally detectable in upper respiratory specimens during the acute phase of infection. The lowest concentration of SARS-CoV-2 viral copies this assay can detect is 138 copies/mL. A negative result does not preclude SARS-Cov-2 infection and should not be used as the sole basis for treatment or other patient management decisions. A negative result may occur with  improper specimen collection/handling, submission of specimen other than nasopharyngeal swab, presence of viral mutation(s) within the areas targeted by this assay, and inadequate number of viral copies(<138 copies/mL). A negative result must be combined with clinical observations, patient history, and epidemiological information. The expected result is Negative.  Fact Sheet for Patients:   EntrepreneurPulse.com.au  Fact Sheet for Healthcare Providers:  IncredibleEmployment.be  This test is no t yet approved or cleared by the [place] FDA and  has been authorized for detection and/or diagnosis of SARS-CoV-2 by FDA under an Emergency Use Authorization (EUA). This EUA will remain  in effect (meaning this test can be used) for the duration of the COVID-19 declaration under Section 564(b)(1) of the Act, 21 U.S.C.section 360bbb-3(b)(1), unless the authorization is terminated  or revoked sooner.       Influenza A by PCR NEGATIVE NEGATIVE Final   Influenza B by PCR NEGATIVE NEGATIVE Final    Comment: (NOTE) The Xpert Xpress SARS-CoV-2/FLU/RSV plus assay is intended as an aid in the diagnosis of influenza from Nasopharyngeal swab specimens and should not be used as a sole basis for treatment. Nasal washings and aspirates are unacceptable for Xpert Xpress SARS-CoV-2/FLU/RSV testing.  Fact Sheet for Patients: EntrepreneurPulse.com.au  Fact Sheet for Healthcare Providers: IncredibleEmployment.be  This test is not yet approved or cleared by the [place] FDA and has been authorized for detection and/or diagnosis of SARS-CoV-2 by FDA under an Emergency Use Authorization (EUA). This EUA will remain in effect (meaning this test can be used) for the duration of the COVID-19 declaration under Section 564(b)(1) of the Act, 21 U.S.C. section 360bbb-3(b)(1), unless the authorization is terminated or revoked.  Performed at [facility], [address] [place],  [address]     Time coordinating discharge: Over 30 minutes  SIGNED:  Lorella Nimrod, MD  [facility] 02/15/2020, 10:25 AM  If 7PM-7AM, please contact night-coverage [email or web address]  This record has been created using Systems analyst. Errors have been sought and corrected,but may not always be  located. Such creation errors do not reflect on the standard of care.

## 2020-02-16 ENCOUNTER — Other Ambulatory Visit: Payer: Self-pay | Admitting: Internal Medicine

## 2020-02-16 DIAGNOSIS — J3089 Other allergic rhinitis: Secondary | ICD-10-CM

## 2020-02-18 ENCOUNTER — Ambulatory Visit (INDEPENDENT_AMBULATORY_CARE_PROVIDER_SITE_OTHER): Payer: Medicare Other | Admitting: Physician Assistant

## 2020-02-18 ENCOUNTER — Encounter: Payer: Self-pay | Admitting: Physician Assistant

## 2020-02-18 ENCOUNTER — Other Ambulatory Visit: Payer: Self-pay

## 2020-02-18 ENCOUNTER — Telehealth: Payer: Self-pay

## 2020-02-18 VITALS — BP 113/71 | HR 56 | Temp 99.2°F | Wt 178.0 lb

## 2020-02-18 DIAGNOSIS — I2102 ST elevation (STEMI) myocardial infarction involving left anterior descending coronary artery: Secondary | ICD-10-CM

## 2020-02-18 DIAGNOSIS — I5021 Acute systolic (congestive) heart failure: Secondary | ICD-10-CM

## 2020-02-18 DIAGNOSIS — K5792 Diverticulitis of intestine, part unspecified, without perforation or abscess without bleeding: Secondary | ICD-10-CM | POA: Diagnosis not present

## 2020-02-18 DIAGNOSIS — I70222 Atherosclerosis of native arteries of extremities with rest pain, left leg: Secondary | ICD-10-CM | POA: Diagnosis not present

## 2020-02-18 DIAGNOSIS — I482 Chronic atrial fibrillation, unspecified: Secondary | ICD-10-CM

## 2020-02-18 MED ORDER — CIPROFLOXACIN HCL 500 MG PO TABS: 500.0000 mg | ORAL_TABLET | Freq: Two times a day (BID) | ORAL | 0 refills | Status: DC

## 2020-02-18 MED ORDER — METRONIDAZOLE 500 MG PO TABS: 500.0000 mg | ORAL_TABLET | Freq: Three times a day (TID) | ORAL | 0 refills | Status: DC

## 2020-02-18 NOTE — Progress Notes (Signed)
Established patient visit   Patient: Kayla Maxwell   DOB: 1945-02-08   75 y.o. Female  MRN: [id number] Visit Date: 02/18/2020  Today's healthcare provider: Mar Daring, PA-C   No chief complaint on file.  Subjective    HPI  Follow up Hospitalization  Patient was admitted to [facility] on 02/12/2020 and discharged on 02/15/2020. She was treated for Myocardial infarction. Treatment for this included medications, left heart catheterization and stenting. Telephone follow up was done on N/A She reports excellent compliance with treatment. She reports this condition is improved.  ----------------------------------------------------------------------------------------- -    Patient Active Problem List   Diagnosis Date Noted  . Myocardial infarction ([place]) 02/12/2020  . Atheroembolism of foot, left ([place]) 01/11/2019  . Venous insufficiency of both lower extremities 11/02/2017  . Pain and swelling of lower leg 06/20/2017  . Benign essential HTN 11/01/2016  . SOBOE (shortness of breath on exertion) 11/01/2016  . Gastroenteritis 08/16/2016  . Bilateral carotid artery stenosis 03/12/2016  . Fine tremor 03/12/2016  . PAD (peripheral artery disease) ([place]) 01/14/2016  . Atherosclerosis of native arteries of extremity with rest pain ([place]) 11/10/2015  . Cough 06/09/2015  . Abnormal blood sugar 04/11/2015  . Anxiety 11/19/2014  . PTSD (post-traumatic stress disorder) 10/29/2014  . Allergic rhinitis 09/05/2014  . Mixed hyperlipidemia 09/05/2014  . Mild memory disturbance 09/05/2014  . H/O stroke without residual deficits 09/05/2014  . Edema 09/05/2014  . Spinal stenosis, lumbar 07/04/2014  . MI (mitral incompetence) 05/08/2014  . TI (tricuspid incompetence) 05/08/2014  . Chronic atrial fibrillation ([place]) 05/08/2014  . History of tobacco abuse 04/29/2014  . Gastro-esophageal reflux disease without esophagitis 04/29/2014  . Obstructive apnea 04/29/2014  . Atrial fibrillation  by electrocardiography ([place]) 04/29/2014  . Neuritis or radiculitis due to rupture of lumbar intervertebral disc 09/21/2013  . DDD (degenerative disc disease), lumbar 08/24/2013  . Bursitis, trochanteric 06/18/2013  . Nerve root inflammation 06/18/2013  . Arthralgia of hip 06/04/2013  . Calculus of gallbladder with other cholecystitis, without mention of obstruction 04/27/2012   Past Medical History:  Diagnosis Date  . Acid reflux    takes Nexium daily  . Allergy    takes Singulair daily as needed  . Anemia   . Atrial fibrillation ([place])   . Cancer ([place]) AGE 66   CERVICAL cancer with removal  . Chronic back pain    stenosis  . Complication of anesthesia   . Constipation    takes Colace daily as needed  . Coronary artery disease   . Difficult intubation   . Dysrhythmia    atrial fibrillation dx 04/2014  . Headache    sinus   . History of blood transfusion   . History of shingles   . Hyperlipidemia    takes Pravastatin daily-started taking this 6 months ago  . Hypertension   . Lumbar surgical wound fluid collection   . Sleep apnea    CPAP  . Stroke ([place])    TIA'S X 2 --- one last yr and one this yr...lost her memory over 4-6 hrs.  . Stroke ([place]) 06-04-15   left with right side weakness  . TIA (transient ischemic attack)    showed up on an MRI but she never knew it  . Urinary frequency    Social History   Tobacco Use  . Smoking status: Former Smoker    Packs/day: 1.00    Years: 47.00    Pack years: 47.00    Quit date: 08/19/2014  Years since quitting: 5.5  . Smokeless tobacco: Former Systems developer    Quit date: 08/19/2014  Vaping Use  . Vaping Use: Never used  Substance Use Topics  . Alcohol use: No  . Drug use: No   Allergies  Allergen Reactions  . Codeine Other (See Comments)    Makes her feel "crazy"  . Penicillins Swelling    Did it involve swelling of the face/tongue/throat, SOB, or low BP? Unknown Did it involve sudden or severe rash/hives, skin peeling, or  any reaction on the inside of your mouth or nose? No Did you need to seek medical attention at a hospital or doctor's office? Unknown When did it last happen?Childhood allergy If all above answers are "NO", may proceed with cephalosporin use.   . Latex   . Adhesive [Tape] Hives    (Latex)  . Sulfa Antibiotics Nausea Only and Rash     Medications: Outpatient Medications Prior to Visit  Medication Sig  . albuterol (PROVENTIL HFA;VENTOLIN HFA) 108 (90 Base) MCG/ACT inhaler Inhale 2 puffs into the lungs every 6 (six) hours as needed for wheezing or shortness of breath.  Marland Kitchen aspirin EC 81 MG EC tablet Take 1 tablet (81 mg total) by mouth daily.  . dapagliflozin propanediol (FARXIGA) 5 MG TABS tablet Take 1 tablet (5 mg total) by mouth daily.  Marland Kitchen FLUoxetine (PROZAC) 20 MG capsule TAKE 1 CAPSULE BY MOUTH EVERY DAY (Patient taking differently: Take 20 mg by mouth daily.)  . furosemide (LASIX) 20 MG tablet Take 1 tablet (20 mg total) by mouth daily as needed for fluid or edema.  . gabapentin (NEURONTIN) 300 MG capsule TAKE 1 CAPSULE BY MOUTH EVERYDAY AT BEDTIME (Patient taking differently: Take 300 mg by mouth at bedtime.)  . HYDROcodone-acetaminophen (NORCO) 5-325 MG tablet Take 1-2 tablets by mouth every 8 (eight) hours as needed for moderate pain or severe pain.  Marland Kitchen lisinopril (ZESTRIL) 2.5 MG tablet TAKE 1 TABLET BY MOUTH EVERY DAY (Patient taking differently: Take 2.5 mg by mouth daily.)  . metoprolol tartrate (LOPRESSOR) 25 MG tablet TAKE 2 TABLETS BY MOUTH TWICE A DAY (Patient taking differently: Take 50 mg by mouth 2 (two) times daily.)  . midodrine (PROAMATINE) 2.5 MG tablet Take 1 tablet (2.5 mg total) by mouth 3 (three) times daily with meals.  . nitroGLYCERIN (NITROSTAT) 0.4 MG SL tablet Place 1 tablet (0.4 mg total) under the tongue every 5 (five) minutes x 3 doses as needed for chest pain.  Marland Kitchen omeprazole (PRILOSEC) 40 MG capsule TAKE 1 CAPSULE BY MOUTH EVERY DAY (Patient taking  differently: Take 40 mg by mouth daily.)  . ondansetron (ZOFRAN ODT) 4 MG disintegrating tablet Take 1 tablet (4 mg total) by mouth every 8 (eight) hours as needed.  . rivaroxaban (XARELTO) 20 MG TABS tablet Take 20 mg by mouth daily with supper.   . rosuvastatin (CRESTOR) 20 MG tablet Take 1 tablet (20 mg total) by mouth daily.  . ticagrelor (BRILINTA) 90 MG TABS tablet Take 1 tablet (90 mg total) by mouth 2 (two) times daily.  . montelukast (SINGULAIR) 10 MG tablet Take 1 tablet (10 mg total) by mouth daily as needed (allergies).   No facility-administered medications prior to visit.    Review of Systems  Constitutional: Positive for fatigue. Negative for activity change, appetite change, chills, diaphoresis, fever and unexpected weight change.  Respiratory: Positive for shortness of breath. Negative for apnea, cough, choking, chest tightness, wheezing and stridor.   Cardiovascular: Negative.   Gastrointestinal: Negative.  Neurological: Negative for dizziness, light-headedness and headaches.        Objective    BP 113/71 (BP Location: Left Arm, Patient Position: Sitting, Cuff Size: Large)   Pulse (!) 56   Temp 99.2 F (37.3 C) (Oral)   Wt 178 lb (80.7 kg)   SpO2 96%   BMI 29.62 kg/m    Physical Exam Vitals reviewed.  Constitutional:      General: She is not in acute distress.    Appearance: Normal appearance. She is well-developed and well-nourished. She is not ill-appearing or diaphoretic.  HENT:     Head: Normocephalic and atraumatic.  Eyes:     General: No scleral icterus.       Right eye: No discharge.        Left eye: No discharge.     Extraocular Movements: Extraocular movements intact.  Neck:     Thyroid: No thyromegaly.     Vascular: No JVD.     Trachea: No tracheal deviation.  Cardiovascular:     Rate and Rhythm: Normal rate and regular rhythm.     Pulses: Normal pulses.     Heart sounds: Normal heart sounds. No murmur heard. No friction rub. No gallop.    Pulmonary:     Effort: Pulmonary effort is normal. No respiratory distress.     Breath sounds: Normal breath sounds. No wheezing or rales.  Musculoskeletal:     Cervical back: Normal range of motion and neck supple.     Right lower leg: No edema.     Left lower leg: No edema.  Lymphadenopathy:     Cervical: No cervical adenopathy.  Skin:    General: Skin is warm and dry.     Capillary Refill: Capillary refill takes less than 2 seconds.  Neurological:     General: No focal deficit present.     Mental Status: She is alert. Mental status is at baseline.  Psychiatric:        Mood and Affect: Mood normal.        Thought Content: Thought content normal.     No results found for any visits on 02/18/20.  Assessment & Plan     1. ST elevation myocardial infarction involving left anterior descending (LAD) coronary artery (HCC) Improved. Followed by cardiology.  Continue medications as prescribed. Will check labs as below and f/u pending results. - CBC w/Diff/Platelet - Comprehensive Metabolic Panel (CMET)  2. Chronic atrial fibrillation (HCC) Stable. Continue medications as prescribed. Followed by cardiology. Will check labs as below and f/u pending results. - CBC w/Diff/Platelet - Comprehensive Metabolic Panel (CMET)  3. Reduced ejection fraction concurrent with and due to acute heart failure ([place]) In lifevest. Followed by Cardiology. See above medical treatment plan. - CBC w/Diff/Platelet - Comprehensive Metabolic Panel (CMET)  4. Diverticulitis Patient having some LLQ pain. Suspects Diverticulitis. Will treat with Flagyl and Cipro. Call if not improving.    No follow-ups on file.      Reynolds Bowl, PA-C, have reviewed all documentation for this visit. The documentation on 03/02/20 for the exam, diagnosis, procedures, and orders are all accurate and complete.   Rubye Beach  [facility] [phone] (phone) [phone]  (fax)  [place]

## 2020-02-18 NOTE — Telephone Encounter (Addendum)
   Post Discharge from  Hospital call to patient   Spoke with patients sister [name].  She states she is weighing daily and weight is steady.  She has a good appetite and does not drink over her fluid restriction.  She did she her PCP and that activity wore her out.  She is sleeping 9 to 12 hours every night in bed with using her CPAP.  All her prescriptions were filled, and taking as ordered.  She has appointment in heart failure clinic with Otila Kluver on February 24.  [name] was asking about agencies that could help out in the home, patient has insurance that would cover this.  Instructed to log onto [email or web address]  Went over her medications list and she is taking as ordered.  Pricilla Riffle RN [facility]

## 2020-02-18 NOTE — Patient Instructions (Signed)
Acute Coronary Syndrome Acute coronary syndrome (ACS) is a serious problem in which there is suddenly not enough blood and oxygen reaching the heart. ACS can result in chest pain or a heart attack. This condition is a medical emergency. If you have any symptoms of this condition, get help right away. What are the causes? This condition may be caused by:  A buildup of fat and cholesterol inside the arteries (atherosclerosis). This is the most common cause. The buildup (plaque) can cause blood vessels in the heart (coronary arteries) to become narrow or blocked, which reduces blood flow to the heart. Plaque can also break off and lead to a clot, which can block an artery and cause a heart attack or stroke.  Sudden tightening of the muscles around the coronary arteries (coronary spasm).  Tearing of a coronary artery (spontaneous coronary artery dissection).  Very low blood pressure (hypotension).  An abnormal heartbeat (arrhythmia).  Other medical conditions that cause a decrease of oxygen to the heart, such as anemiaorrespiratory failure.  Using cocaine or methamphetamine.   What increases the risk? The following factors may make you more likely to develop this condition:  Age. The risk for ACS increases as you get older.  History of chest pain, heart attack, peripheral artery disease, or stroke.  Having taken chemotherapy or immune-suppressing medicines.  Being female.  Family history of chest pain, heart disease, or stroke.  Smoking.  Not exercising enough.  Being overweight.  High cholesterol.  High blood pressure (hypertension).  Diabetes.  Excessive alcohol use. What are the signs or symptoms? Common symptoms of this condition include:  Chest pain. The pain may last a long time, or it may stop and come back (recur). It may feel like: ? Crushing or squeezing. ? Tightness, pressure, fullness, or heaviness.  Arm, neck, jaw, or back pain.  Heartburn or  indigestion.  Shortness of breath.  Nausea.  Sudden cold sweats.  Light-headedness.  Dizziness or passing out.  Tiredness (fatigue). Sometimes there are no symptoms. How is this diagnosed? This condition may be diagnosed based on:  Your medical history and symptoms.  Imaging tests, such as: ? An electrocardiogram (ECG). This measures the heart's electrical activity. ? X-rays. ? CT scan. ? A coronary angiogram. For this test, dye is injected into the heart arteries and then X-rays are taken. ? Myocardial perfusion imaging. This test shows how well blood flows through your heart muscle.  Blood tests. These may be repeated at certain time intervals.  Exercise stress testing.  Echocardiogram. This is a test that uses sound waves to produce detailed images of the heart. How is this treated? Treatment for this condition may include:  Oxygen therapy.  Medicines, such as: ? Antiplatelet medicines and blood-thinning medicines, such as aspirin. These help prevent blood clots. ? Medicine that dissolves any blood clots (fibrinolytic therapy). ? Blood pressure medicines. ? Nitroglycerin. This helps widen blood vessels to improve blood flow. ? Pain medicine. ? Cholesterol-lowering medicine.  Surgery, such as: ? Coronary angioplasty with stent placement. This involves placing a small piece of metal that looks like mesh or a spring into a narrow coronary artery. This widens the artery and keeps it open. ? Coronary artery bypass surgery. This involves taking a section of a blood vessel from a different part of your body and placing it on the blocked coronary artery to allow blood to flow around the blockage.  Cardiac rehabilitation. This is a program that includes exercise training, education, and counseling to help  you recover. Follow these instructions at home: Eating and drinking  Eat a heart-healthy diet that includes whole grains, fruits and vegetables, lean proteins, and  low-fat or nonfat dairy products.  Limit how much salt (sodium) you eat as told by your health care provider. Follow instructions from your health care provider about any other eating or drinking restrictions, such as limiting foods that are high in fat and processed sugars.  Use healthy cooking methods such as roasting, grilling, broiling, baking, poaching, steaming, or stir-frying.  Work with a dietitian to follow a heart-healthy eating plan. Medicines  Take over-the-counter and prescription medicines only as told by your health care provider.  Do not take these medicines unless your health care provider approves: ? Vitamin supplements that contain vitamin A or vitamin E. ? NSAIDs, such as ibuprofen, naproxen, or celecoxib. ? Hormone replacement therapy that contains estrogen.  If you are taking blood thinners: ? Talk with your health care provider before you take any medicines that contain aspirin or NSAIDs. These medicines increase your risk for dangerous bleeding. ? Take your medicine exactly as told, at the same time every day. ? Avoid activities that could cause injury or bruising, and follow instructions about how to prevent falls. ? Wear a medical alert bracelet, and carry a card that lists what medicines you take. Activity  Follow your cardiac rehabilitation program. Do exercises as told by your physical therapist.  Ask your health care provider what activities and exercises are safe for you. Follow his or her instructions about lifting, driving, or climbing stairs. Lifestyle  Do not use any products that contain nicotine or tobacco, such as cigarettes, e-cigarettes, and chewing tobacco. If you need help quitting, ask your health care provider.  Do not drink alcohol if your health care provider tells you not to drink.  If you drink alcohol: ? Limit how much you have to 0-1 drink a day. ? Be aware of how much alcohol is in your drink. In the [place] one drink equals one 12 oz  bottle of beer (355 mL), one 5 oz glass of wine (148 mL), or one 1 oz glass of hard liquor (44 mL).  Maintain a healthy weight. If you need to lose weight, work with your health care provider to do so safely. General instructions  Tell all the health care providers who provide care for you about your heart condition, including your dentist. This may affect the medicines or treatment you receive.  Manage any other health conditions you have, such as hypertension or diabetes. These conditions affect your heart.  Pay attention to your mental health. You may be at higher risk for depression. ? Find ways to manage stress. ? Talk to your health care provider about depression screening and treatment.  Keep your vaccinations up to date. ? Get the flu shot (influenza vaccine) every year. ? Get the pneumococcal vaccine if you are age 40 or older.  If directed, monitor your blood pressure at home.  Keep all follow-up visits as told by your health care provider. This is important. Contact a health care provider if you:  Feel overwhelmed or sad.  Have trouble doing your daily activities. Get help right away if you:  Have pain in your chest, neck, arm, jaw, stomach, or back that recurs, and: ? It lasts for more than a few minutes. ? It is not relieved by taking the [place] health care provider prescribed.  Have unexplained: ? Heavy sweating. ? Heartburn or indigestion. ? Nausea  or vomiting. ? Shortness of breath. ? Difficulty breathing. ? Fatigue. ? Nervousness or anxiety. ? Weakness. ? Diarrhea. ? Dark stools or blood in your stool.  Have sudden light-headedness or dizziness.  Have blood pressure that is higher than 180/120.  Faint.  Have thoughts about hurting yourself. These symptoms may represent a serious problem that is an emergency. Do not wait to see if the symptoms will go away. Get medical help right away. Call your local emergency services (911 in the [place] Do  not drive yourself to the hospital.  Summary  Acute coronary syndrome (ACS) is when there is not enough blood and oxygen being supplied to the heart. ACS can result in chest pain or a heart attack.  Acute coronary syndrome is a medical emergency. If you have any symptoms of this condition, get help right away.  Treatment includes medicines and procedures to open the blocked arteries and restore blood flow. This information is not intended to replace advice given to you by your health care provider. Make sure you discuss any questions you have with your health care provider. Document Revised: 05/24/2018 Document Reviewed: 01/02/2018 Elsevier Patient Education  2021 [employer].

## 2020-02-19 ENCOUNTER — Encounter: Payer: Self-pay | Admitting: Physician Assistant

## 2020-02-19 ENCOUNTER — Telehealth (INDEPENDENT_AMBULATORY_CARE_PROVIDER_SITE_OTHER): Payer: Medicare Other | Admitting: Physician Assistant

## 2020-02-19 DIAGNOSIS — R059 Cough, unspecified: Secondary | ICD-10-CM | POA: Diagnosis not present

## 2020-02-19 DIAGNOSIS — R0981 Nasal congestion: Secondary | ICD-10-CM | POA: Diagnosis not present

## 2020-02-19 MED ORDER — BENZONATATE 200 MG PO CAPS: 200.0000 mg | ORAL_CAPSULE | Freq: Three times a day (TID) | ORAL | 0 refills | Status: DC | PRN

## 2020-02-19 NOTE — Progress Notes (Signed)
Virtual telephone visit    Virtual Visit via Telephone Note   This visit type was conducted due to national recommendations for restrictions regarding the COVID-19 Pandemic (e.g. social distancing) in an effort to limit this patient's exposure and mitigate transmission in our community. Due to her co-morbid illnesses, this patient is at least at moderate risk for complications without adequate follow up. This format is felt to be most appropriate for this patient at this time. The patient did not have access to video technology or had technical difficulties with video requiring transitioning to audio format only (telephone). Physical exam was limited to content and character of the telephone converstion.    Patient location: Home Provider location: [facility]  I discussed the limitations of evaluation and management by telemedicine and the availability of in person appointments. The patient expressed understanding and agreed to proceed.   Visit Date: 02/19/2020  Today's healthcare provider: Mar Daring, PA-C   No chief complaint on file.  Subjective    Cough This is a new problem. The current episode started yesterday. The problem has been unchanged. Associated symptoms include nasal congestion and rhinorrhea. Pertinent negatives include no chills, ear congestion, ear pain, fever, headaches, hemoptysis, myalgias, postnasal drip, sore throat, shortness of breath, weight loss or wheezing.     Patient Active Problem List   Diagnosis Date Noted  . Myocardial infarction ([place]) 02/12/2020  . Atheroembolism of foot, left ([place]) 01/11/2019  . Venous insufficiency of both lower extremities 11/02/2017  . Pain and swelling of lower leg 06/20/2017  . Benign essential HTN 11/01/2016  . SOBOE (shortness of breath on exertion) 11/01/2016  . Gastroenteritis 08/16/2016  . Bilateral carotid artery stenosis 03/12/2016  . Fine tremor 03/12/2016  . PAD (peripheral artery disease) ([place]) 01/14/2016   . Atherosclerosis of native arteries of extremity with rest pain ([place]) 11/10/2015  . Cough 06/09/2015  . Abnormal blood sugar 04/11/2015  . Anxiety 11/19/2014  . PTSD (post-traumatic stress disorder) 10/29/2014  . Allergic rhinitis 09/05/2014  . Mixed hyperlipidemia 09/05/2014  . Mild memory disturbance 09/05/2014  . H/O stroke without residual deficits 09/05/2014  . Edema 09/05/2014  . Spinal stenosis, lumbar 07/04/2014  . MI (mitral incompetence) 05/08/2014  . TI (tricuspid incompetence) 05/08/2014  . Chronic atrial fibrillation ([place]) 05/08/2014  . History of tobacco abuse 04/29/2014  . Gastro-esophageal reflux disease without esophagitis 04/29/2014  . Obstructive apnea 04/29/2014  . Atrial fibrillation by electrocardiography ([place]) 04/29/2014  . Neuritis or radiculitis due to rupture of lumbar intervertebral disc 09/21/2013  . DDD (degenerative disc disease), lumbar 08/24/2013  . Bursitis, trochanteric 06/18/2013  . Nerve root inflammation 06/18/2013  . Arthralgia of hip 06/04/2013  . Calculus of gallbladder with other cholecystitis, without mention of obstruction 04/27/2012   Past Medical History:  Diagnosis Date  . Acid reflux    takes Nexium daily  . Allergy    takes Singulair daily as needed  . Anemia   . Atrial fibrillation ([place])   . Cancer ([place]) AGE 52   CERVICAL cancer with removal  . CHF (congestive heart failure) ([place])   . Chronic back pain    stenosis  . Complication of anesthesia   . Constipation    takes Colace daily as needed  . Coronary artery disease   . Difficult intubation   . Dysrhythmia    atrial fibrillation dx 04/2014  . Headache    sinus   . History of blood transfusion   . History of shingles   .  Hyperlipidemia    takes Pravastatin daily-started taking this 6 months ago  . Hypertension   . Lumbar surgical wound fluid collection   . Sleep apnea    CPAP  . Stroke ([place])    TIA'S X 2 --- one last yr and one this yr...lost her memory over  4-6 hrs.  . Stroke ([place]) 06-04-15   left with right side weakness  . TIA (transient ischemic attack)    showed up on an MRI but she never knew it  . Urinary frequency       Medications: Outpatient Medications Prior to Visit  Medication Sig  . albuterol (PROVENTIL HFA;VENTOLIN HFA) 108 (90 Base) MCG/ACT inhaler Inhale 2 puffs into the lungs every 6 (six) hours as needed for wheezing or shortness of breath.  Marland Kitchen aspirin EC 81 MG EC tablet Take 1 tablet (81 mg total) by mouth daily.  . dapagliflozin propanediol (FARXIGA) 5 MG TABS tablet Take 1 tablet (5 mg total) by mouth daily.  Marland Kitchen FLUoxetine (PROZAC) 20 MG capsule TAKE 1 CAPSULE BY MOUTH EVERY DAY (Patient taking differently: Take 20 mg by mouth daily.)  . furosemide (LASIX) 20 MG tablet Take 1 tablet (20 mg total) by mouth daily as needed for fluid or edema.  . gabapentin (NEURONTIN) 300 MG capsule TAKE 1 CAPSULE BY MOUTH EVERYDAY AT BEDTIME (Patient taking differently: Take 300 mg by mouth at bedtime.)  . HYDROcodone-acetaminophen (NORCO) 5-325 MG tablet Take 1-2 tablets by mouth every 8 (eight) hours as needed for moderate pain or severe pain.  Marland Kitchen lisinopril (ZESTRIL) 2.5 MG tablet TAKE 1 TABLET BY MOUTH EVERY DAY (Patient taking differently: Take 2.5 mg by mouth daily.)  . metoprolol tartrate (LOPRESSOR) 25 MG tablet TAKE 2 TABLETS BY MOUTH TWICE A DAY (Patient taking differently: Take 50 mg by mouth 2 (two) times daily.)  . midodrine (PROAMATINE) 2.5 MG tablet Take 1 tablet (2.5 mg total) by mouth 3 (three) times daily with meals.  . montelukast (SINGULAIR) 10 MG tablet Take 1 tablet (10 mg total) by mouth daily as needed (allergies).  . nitroGLYCERIN (NITROSTAT) 0.4 MG SL tablet Place 1 tablet (0.4 mg total) under the tongue every 5 (five) minutes x 3 doses as needed for chest pain.  Marland Kitchen omeprazole (PRILOSEC) 40 MG capsule TAKE 1 CAPSULE BY MOUTH EVERY DAY (Patient taking differently: Take 40 mg by mouth daily.)  . ondansetron (ZOFRAN ODT) 4  MG disintegrating tablet Take 1 tablet (4 mg total) by mouth every 8 (eight) hours as needed.  . rivaroxaban (XARELTO) 20 MG TABS tablet Take 20 mg by mouth daily with supper.   . rosuvastatin (CRESTOR) 20 MG tablet Take 1 tablet (20 mg total) by mouth daily.  . ticagrelor (BRILINTA) 90 MG TABS tablet Take 1 tablet (90 mg total) by mouth 2 (two) times daily.   No facility-administered medications prior to visit.    Review of Systems  Constitutional: Negative for chills, fever and weight loss.  HENT: Positive for rhinorrhea. Negative for ear pain, postnasal drip and sore throat.   Respiratory: Positive for cough. Negative for hemoptysis, shortness of breath and wheezing.   Musculoskeletal: Negative for myalgias.  Neurological: Negative for headaches.    Last CBC Lab Results  Component Value Date   WBC 9.1 02/22/2020   HGB 11.9 (L) 02/22/2020   HCT 40.1 02/22/2020   MCV 81.7 02/22/2020   MCH 24.2 (L) 02/22/2020   RDW 16.7 (H) 02/22/2020   PLT 392 75/64/3329   Last metabolic panel  Lab Results  Component Value Date   GLUCOSE 122 (H) 02/22/2020   NA 134 (L) 02/22/2020   K 3.9 02/22/2020   CL 101 02/22/2020   CO2 27 02/22/2020   BUN 18 02/22/2020   CREATININE 1.16 (H) 02/22/2020   GFRNONAA 49 (L) 02/22/2020   GFRAA 49 (L) 08/13/2019   CALCIUM 8.5 (L) 02/22/2020   PROT 6.1 (L) 02/22/2020   ALBUMIN 3.3 (L) 02/22/2020   LABGLOB 2.3 04/04/2017   AGRATIO 1.9 04/04/2017   BILITOT 1.1 02/22/2020   ALKPHOS 68 02/22/2020   AST 14 (L) 02/22/2020   ALT 13 02/22/2020   ANIONGAP 6 02/22/2020      Objective    There were no vitals taken for this visit. BP Readings from Last 3 Encounters:  02/29/20 98/70  02/18/20 113/71  02/15/20 109/67   Wt Readings from Last 3 Encounters:  02/29/20 172 lb (78 kg)  02/18/20 178 lb (80.7 kg)  02/15/20 178 lb 9.2 oz (81 kg)        Assessment & Plan     1. Nasal congestion Worsening. Discussed adding flonase PRN. Nasal saline  irrigation can help as well.   2. Cough Tessalon perles prescribed for cough. Push fluids. Call if worsening.    No follow-ups on file.    I discussed the assessment and treatment plan with the patient. The patient was provided an opportunity to ask questions and all were answered. The patient agreed with the plan and demonstrated an understanding of the instructions.   The patient was advised to call back or seek an in-person evaluation if the symptoms worsen or if the condition fails to improve as anticipated.  I provided 12 minutes of non-face-to-face time during this encounter.  Reynolds Bowl, PA-C, have reviewed all documentation for this visit. The documentation on 03/02/20 for the exam, diagnosis, procedures, and orders are all accurate and complete.  [place] [facility] [phone] (phone) [phone] (fax)  [place]

## 2020-02-21 ENCOUNTER — Encounter: Payer: Self-pay | Admitting: Physician Assistant

## 2020-02-21 DIAGNOSIS — J0140 Acute pansinusitis, unspecified: Secondary | ICD-10-CM

## 2020-02-21 MED ORDER — DOXYCYCLINE HYCLATE 100 MG PO TABS: 100.0000 mg | ORAL_TABLET | Freq: Two times a day (BID) | ORAL | 0 refills | Status: DC

## 2020-02-22 ENCOUNTER — Other Ambulatory Visit
Admission: RE | Admit: 2020-02-22 | Discharge: 2020-02-22 | Disposition: A | Discharge status: Home / Self Care | Payer: Medicare Other | Source: Ambulatory Visit | Attending: Physician Assistant | Admitting: Physician Assistant

## 2020-02-22 DIAGNOSIS — K5792 Diverticulitis of intestine, part unspecified, without perforation or abscess without bleeding: Secondary | ICD-10-CM | POA: Insufficient documentation

## 2020-02-22 DIAGNOSIS — I482 Chronic atrial fibrillation, unspecified: Secondary | ICD-10-CM | POA: Diagnosis not present

## 2020-02-22 DIAGNOSIS — I5022 Chronic systolic (congestive) heart failure: Secondary | ICD-10-CM | POA: Insufficient documentation

## 2020-02-22 DIAGNOSIS — I2109 ST elevation (STEMI) myocardial infarction involving other coronary artery of anterior wall: Secondary | ICD-10-CM | POA: Diagnosis not present

## 2020-02-22 LAB — COMPREHENSIVE METABOLIC PANEL
ALT: 13 U/L (ref 0–44)
AST: 14 U/L — ABNORMAL LOW (ref 15–41)
Albumin: 3.3 g/dL — ABNORMAL LOW (ref 3.5–5.0)
Alkaline Phosphatase: 68 U/L (ref 38–126)
Anion gap: 6 (ref 5–15)
BUN: 18 mg/dL (ref 8–23)
CO2: 27 mmol/L (ref 22–32)
Calcium: 8.5 mg/dL — ABNORMAL LOW (ref 8.9–10.3)
Chloride: 101 mmol/L (ref 98–111)
Creatinine, Ser: 1.16 mg/dL — ABNORMAL HIGH (ref 0.44–1.00)
GFR, Estimated: 49 mL/min — ABNORMAL LOW (ref >60)
Glucose, Bld: 122 mg/dL — ABNORMAL HIGH (ref 70–99)
Potassium: 3.9 mmol/L (ref 3.5–5.1)
Sodium: 134 mmol/L — ABNORMAL LOW (ref 135–145)
Total Bilirubin: 1.1 mg/dL (ref 0.3–1.2)
Total Protein: 6.1 g/dL — ABNORMAL LOW (ref 6.5–8.1)

## 2020-02-22 LAB — CBC WITH DIFFERENTIAL/PLATELET
Abs Immature Granulocytes: 0.04 10*3/uL (ref 0.00–0.07)
Basophils Absolute: 0.1 10*3/uL (ref 0.0–0.1)
Basophils Relative: 1 %
Eosinophils Absolute: 0.1 10*3/uL (ref 0.0–0.5)
Eosinophils Relative: 1 %
HCT: 40.1 % (ref 36.0–46.0)
Hemoglobin: 11.9 g/dL — ABNORMAL LOW (ref 12.0–15.0)
Immature Granulocytes: 0 %
Lymphocytes Relative: 13 %
Lymphs Abs: 1.2 10*3/uL (ref 0.7–4.0)
MCH: 24.2 pg — ABNORMAL LOW (ref 26.0–34.0)
MCHC: 29.7 g/dL — ABNORMAL LOW (ref 30.0–36.0)
MCV: 81.7 fL (ref 80.0–100.0)
Monocytes Absolute: 0.3 10*3/uL (ref 0.1–1.0)
Monocytes Relative: 3 %
Neutro Abs: 7.4 10*3/uL (ref 1.7–7.7)
Neutrophils Relative %: 82 %
Platelets: 392 10*3/uL (ref 150–400)
RBC: 4.91 MIL/uL (ref 3.87–5.11)
RDW: 16.7 % — ABNORMAL HIGH (ref 11.5–15.5)
WBC: 9.1 10*3/uL (ref 4.0–10.5)
nRBC: 0 % (ref 0.0–0.2)

## 2020-02-29 ENCOUNTER — Other Ambulatory Visit: Payer: Self-pay

## 2020-02-29 ENCOUNTER — Ambulatory Visit: Payer: Medicare Other | Attending: Family | Admitting: Family

## 2020-02-29 ENCOUNTER — Encounter: Payer: Self-pay | Admitting: Family

## 2020-02-29 ENCOUNTER — Other Ambulatory Visit: Payer: Self-pay | Admitting: Physician Assistant

## 2020-02-29 VITALS — BP 98/70 | HR 74 | Resp 18 | Ht 65.0 in | Wt 172.0 lb

## 2020-02-29 DIAGNOSIS — Z8673 Personal history of transient ischemic attack (TIA), and cerebral infarction without residual deficits: Secondary | ICD-10-CM | POA: Diagnosis not present

## 2020-02-29 DIAGNOSIS — Z87891 Personal history of nicotine dependence: Secondary | ICD-10-CM | POA: Diagnosis not present

## 2020-02-29 DIAGNOSIS — Z7984 Long term (current) use of oral hypoglycemic drugs: Secondary | ICD-10-CM | POA: Insufficient documentation

## 2020-02-29 DIAGNOSIS — Z79899 Other long term (current) drug therapy: Secondary | ICD-10-CM | POA: Diagnosis not present

## 2020-02-29 DIAGNOSIS — Z885 Allergy status to narcotic agent status: Secondary | ICD-10-CM | POA: Insufficient documentation

## 2020-02-29 DIAGNOSIS — G4730 Sleep apnea, unspecified: Secondary | ICD-10-CM | POA: Insufficient documentation

## 2020-02-29 DIAGNOSIS — I2102 ST elevation (STEMI) myocardial infarction involving left anterior descending coronary artery: Secondary | ICD-10-CM

## 2020-02-29 DIAGNOSIS — I2510 Atherosclerotic heart disease of native coronary artery without angina pectoris: Secondary | ICD-10-CM | POA: Diagnosis not present

## 2020-02-29 DIAGNOSIS — Z880 Allergy status to penicillin: Secondary | ICD-10-CM | POA: Insufficient documentation

## 2020-02-29 DIAGNOSIS — I4891 Unspecified atrial fibrillation: Secondary | ICD-10-CM | POA: Insufficient documentation

## 2020-02-29 DIAGNOSIS — Z882 Allergy status to sulfonamides status: Secondary | ICD-10-CM | POA: Diagnosis not present

## 2020-02-29 DIAGNOSIS — Z7901 Long term (current) use of anticoagulants: Secondary | ICD-10-CM | POA: Diagnosis not present

## 2020-02-29 DIAGNOSIS — Z955 Presence of coronary angioplasty implant and graft: Secondary | ICD-10-CM | POA: Insufficient documentation

## 2020-02-29 DIAGNOSIS — Z8679 Personal history of other diseases of the circulatory system: Secondary | ICD-10-CM

## 2020-02-29 DIAGNOSIS — F3341 Major depressive disorder, recurrent, in partial remission: Secondary | ICD-10-CM

## 2020-02-29 DIAGNOSIS — Z8249 Family history of ischemic heart disease and other diseases of the circulatory system: Secondary | ICD-10-CM | POA: Diagnosis not present

## 2020-02-29 DIAGNOSIS — I5022 Chronic systolic (congestive) heart failure: Secondary | ICD-10-CM | POA: Diagnosis not present

## 2020-02-29 DIAGNOSIS — I110 Hypertensive heart disease with heart failure: Secondary | ICD-10-CM | POA: Diagnosis not present

## 2020-02-29 DIAGNOSIS — I10 Essential (primary) hypertension: Secondary | ICD-10-CM

## 2020-02-29 DIAGNOSIS — Z7982 Long term (current) use of aspirin: Secondary | ICD-10-CM | POA: Insufficient documentation

## 2020-02-29 DIAGNOSIS — E785 Hyperlipidemia, unspecified: Secondary | ICD-10-CM | POA: Insufficient documentation

## 2020-02-29 NOTE — Patient Instructions (Signed)
Continue weighing daily and call for an overnight weight gain of > 2 pounds or a weekly weight gain of >5 pounds. 

## 2020-02-29 NOTE — Progress Notes (Signed)
Patient ID: Kayla Maxwell, female    DOB: Nov 18, 1945, 75 y.o.   MRN: [id number]  HPI  Ms Harten is a 75 y/o female with a history of CAD, hyperlipidemia, HTN, stroke, TIA, sleep apnea, anemia, atrial fibrillation, previous tobacco use and chronic heart failure.   Echo report from 02/12/20 reviewed and showed an EF of 30-35%  LHC done 02/13/20 showed:  Mid LAD lesion is 99% stenosed.  Post intervention, there is a 0% residual stenosis.  A stent was successfully placed.  Minimal disease in circumflex  Minimal disease in RCA  Successful PCI and stent of mid LAD   Conclusion Severely depressed left ventricular function anterior apical septal akinesis EF less than 25% Successful cardiac cath Radial approach Large left main free of disease LAD large with 100% mid lesion thrombotic ulcerated Circumflex medium to large minor irregularity RCA medium free of disease  Admitted 02/12/20 due to chest pain with subsequent weakness. ST elevation noted but symptoms had been going on for many days prior to presentation. Cardiology consult obtained. Cath done with stent placed to mid LAD. Lifevest placed. Referral made to cardiac rehab. Discharged after 3 days.   She presents today for her initial visit with a chief complaint of minimal fatigue upon moderate exertion. She describes this as having been present for several months. She has associated cough and shortness of breath along with this. She denies any difficulty sleeping, abdominal distention, palpitations, pedal edema, chest pain, dizziness or weight gain.   Denies having any shocks delivered with her lifevest.   Past Medical History:  Diagnosis Date  . Acid reflux    takes Nexium daily  . Allergy    takes Singulair daily as needed  . Anemia   . Atrial fibrillation ([place])   . Cancer ([place]) AGE 38   CERVICAL cancer with removal  . CHF (congestive heart failure) ([place])   . Chronic back pain    stenosis  . Complication of anesthesia   .  Constipation    takes Colace daily as needed  . Coronary artery disease   . Difficult intubation   . Dysrhythmia    atrial fibrillation dx 04/2014  . Headache    sinus   . History of blood transfusion   . History of shingles   . Hyperlipidemia    takes Pravastatin daily-started taking this 6 months ago  . Hypertension   . Lumbar surgical wound fluid collection   . Sleep apnea    CPAP  . Stroke ([place])    TIA'S X 2 --- one last yr and one this yr...lost her memory over 4-6 hrs.  . Stroke ([place]) 06-04-15   left with right side weakness  . TIA (transient ischemic attack)    showed up on an MRI but she never knew it  . Urinary frequency    Past Surgical History:  Procedure Laterality Date  . ABDOMINAL HYSTERECTOMY    . APPENDECTOMY  1963  . BACK SURGERY  08/19/2014   Dr. Hal Neer ([place] [facility])  . BREAST SURGERY  1984   augementation  . [place]  . CHOLECYSTECTOMY  2014   Dr Jamal Collin  . COLONOSCOPY    . CORONARY STENT INTERVENTION N/A 02/13/2020   Procedure: CORONARY STENT INTERVENTION;  Surgeon: Yolonda Kida, MD;  Location: [facility];  Service: Cardiovascular;  Laterality: N/A;  . ESOPHAGOGASTRODUODENOSCOPY    . HAMMER TOE SURGERY Bilateral   . LEFT HEART CATH AND CORONARY ANGIOGRAPHY N/A  02/13/2020   Procedure: LEFT HEART CATH AND CORONARY ANGIOGRAPHY and possible PCI and stent;  Surgeon: Yolonda Kida, MD;  Location: [facility];  Service: Cardiovascular;  Laterality: N/A;  . LOWER EXTREMITY ANGIOGRAPHY Left 01/23/2019   Procedure: LOWER EXTREMITY ANGIOGRAPHY;  Surgeon: Katha Cabal, MD;  Location: [facility];  Service: Cardiovascular;  Laterality: Left;  . LOWER EXTREMITY ANGIOGRAPHY Left 02/20/2019   Procedure: LOWER EXTREMITY ANGIOGRAPHY;  Surgeon: Katha Cabal, MD;  Location: [facility];  Service: Cardiovascular;  Laterality: Left;  . LUMBAR LAMINECTOMY WITH COFLEX 1 LEVEL Bilateral 07/04/2014    Procedure: Laminectomy and Foraminotomy - bilateral - Lumbar Four-Five with coflex ;  Surgeon: Karie Chimera, MD;  Location: [facility];  Service: Neurosurgery;  Laterality: Bilateral;  . LUMBAR WOUND DEBRIDEMENT N/A 08/19/2014   Procedure: Exploration of Lumbar wound;  Surgeon: Karie Chimera, MD;  Location: [facility];  Service: Neurosurgery;  Laterality: N/A;  . PERIPHERAL VASCULAR CATHETERIZATION Left 08/12/2015   Procedure: Lower Extremity Angiography;  Surgeon: Katha Cabal, MD;  Location: [facility];  Service: Cardiovascular;  Laterality: Left;  . TONSILLECTOMY    . VENTRAL HERNIA REPAIR N/A 05/26/2015   Procedure: HERNIA REPAIR VENTRAL ADULT;  Surgeon: Christene Lye, MD;  Location: [facility];  Service: General;  Laterality: N/A;   Family History  Problem Relation Age of Onset  . Alzheimer's disease Mother   . Heart attack Father   . Pancreatic cancer Sister   . Healthy Brother    Social History   Tobacco Use  . Smoking status: Former Smoker    Packs/day: 1.00    Years: 47.00    Pack years: 47.00    Quit date: 08/19/2014    Years since quitting: 5.5  . Smokeless tobacco: Former Systems developer    Quit date: 08/19/2014  Substance Use Topics  . Alcohol use: No   Allergies  Allergen Reactions  . Codeine Other (See Comments)    Makes her feel "crazy"  . Penicillins Swelling    Did it involve swelling of the face/tongue/throat, SOB, or low BP? Unknown Did it involve sudden or severe rash/hives, skin peeling, or any reaction on the inside of your mouth or nose? No Did you need to seek medical attention at a hospital or doctor's office? Unknown When did it last happen?Childhood allergy If all above answers are "NO", may proceed with cephalosporin use.   . Latex   . Adhesive [Tape] Hives    (Latex)  . Sulfa Antibiotics Nausea Only and Rash   Prior to Admission medications   Medication Sig Start Date End Date Taking? Authorizing Provider  albuterol  (PROVENTIL HFA;VENTOLIN HFA) 108 (90 Base) MCG/ACT inhaler Inhale 2 puffs into the lungs every 6 (six) hours as needed for wheezing or shortness of breath. 06/09/16  Yes Mar Daring, PA-C  aspirin EC 81 MG EC tablet Take 1 tablet (81 mg total) by mouth daily. 06/08/15  Yes Henreitta Leber, MD  dapagliflozin propanediol (FARXIGA) 5 MG TABS tablet Take 1 tablet (5 mg total) by mouth daily. 02/15/20  Yes Lorella Nimrod, MD  FLUoxetine (PROZAC) 20 MG capsule TAKE 1 CAPSULE BY MOUTH EVERY DAY 02/29/20  Yes Mar Daring, PA-C  furosemide (LASIX) 20 MG tablet Take 1 tablet (20 mg total) by mouth daily as needed for fluid or edema. 02/15/20  Yes Lorella Nimrod, MD  gabapentin (NEURONTIN) 300 MG capsule TAKE 1 CAPSULE BY MOUTH EVERYDAY AT  BEDTIME Patient taking differently: Take 300 mg by mouth at bedtime. 01/31/20  Yes Kris Hartmann, NP  HYDROcodone-acetaminophen (NORCO) 5-325 MG tablet Take 1-2 tablets by mouth every 8 (eight) hours as needed for moderate pain or severe pain. 01/29/20  Yes Mar Daring, PA-C  lisinopril (ZESTRIL) 2.5 MG tablet TAKE 1 TABLET BY MOUTH EVERY DAY 02/29/20  Yes Fenton Malling M, PA-C  metoprolol tartrate (LOPRESSOR) 25 MG tablet TAKE 2 TABLETS BY MOUTH TWICE A DAY Patient taking differently: Take 50 mg by mouth 2 (two) times daily. 08/14/18  Yes Mar Daring, PA-C  midodrine (PROAMATINE) 2.5 MG tablet Take 1 tablet (2.5 mg total) by mouth 3 (three) times daily with meals. 02/15/20  Yes Lorella Nimrod, MD  montelukast (SINGULAIR) 10 MG tablet Take 1 tablet (10 mg total) by mouth daily as needed (allergies). 02/15/20  Yes Lorella Nimrod, MD  nitroGLYCERIN (NITROSTAT) 0.4 MG SL tablet Place 1 tablet (0.4 mg total) under the tongue every 5 (five) minutes x 3 doses as needed for chest pain. 02/15/20  Yes Lorella Nimrod, MD  omeprazole (PRILOSEC) 40 MG capsule TAKE 1 CAPSULE BY MOUTH EVERY DAY Patient taking differently: Take 40 mg by mouth daily. 09/02/19  Yes  Mar Daring, PA-C  ondansetron (ZOFRAN ODT) 4 MG disintegrating tablet Take 1 tablet (4 mg total) by mouth every 8 (eight) hours as needed. 08/07/19  Yes Lavonia Drafts, MD  rivaroxaban (XARELTO) 20 MG TABS tablet Take 20 mg by mouth daily with supper.  08/27/14  Yes [provider]  rosuvastatin (CRESTOR) 20 MG tablet Take 1 tablet (20 mg total) by mouth daily. 02/15/20  Yes Lorella Nimrod, MD  ticagrelor (BRILINTA) 90 MG TABS tablet Take 1 tablet (90 mg total) by mouth 2 (two) times daily. 02/15/20  Yes Lorella Nimrod, MD    Review of Systems  Constitutional: Positive for fatigue. Negative for appetite change.  HENT: Negative for congestion, postnasal drip and sore throat.   Eyes: Negative.   Respiratory: Positive for cough (dry ) and shortness of breath.   Cardiovascular: Negative for chest pain, palpitations and leg swelling.  Gastrointestinal: Negative for abdominal distention and abdominal pain.  Endocrine: Negative.   Genitourinary: Negative.   Musculoskeletal: Negative for back pain and neck pain.  Skin: Negative.   Allergic/Immunologic: Negative.   Neurological: Negative for dizziness and light-headedness.  Hematological: Negative for adenopathy. Bruises/bleeds easily.  Psychiatric/Behavioral: Negative for dysphoric mood and sleep disturbance (sleeping on 1 pillow with CPAP). The patient is not nervous/anxious.    Vitals:   02/29/20 1348  BP: 98/70  Pulse: 74  Resp: 18  SpO2: 90%  Weight: 172 lb (78 kg)  Height: 5\' 5"  (1.651 m)   Wt Readings from Last 3 Encounters:  02/29/20 172 lb (78 kg)  02/18/20 178 lb (80.7 kg)  02/15/20 178 lb 9.2 oz (81 kg)   Lab Results  Component Value Date   CREATININE 1.16 (H) 02/22/2020   CREATININE 0.98 02/14/2020   CREATININE 1.14 (H) 02/13/2020    Physical Exam Vitals and nursing note reviewed. Exam conducted with a chaperone present (daughter and sister).  Constitutional:      Appearance: Normal appearance.  HENT:      Head: Normocephalic and atraumatic.  Cardiovascular:     Rate and Rhythm: Normal rate and regular rhythm.  Pulmonary:     Effort: Pulmonary effort is normal. No respiratory distress.     Breath sounds: No wheezing or rales.  Abdominal:  General: Abdomen is flat. There is no distension.     Palpations: Abdomen is soft.  Musculoskeletal:        General: No tenderness.     Cervical back: Normal range of motion and neck supple.     Right lower leg: No edema.     Left lower leg: No edema.  Skin:    General: Skin is warm and dry.  Neurological:     General: No focal deficit present.     Mental Status: She is alert and oriented to person, place, and time.  Psychiatric:        Mood and Affect: Mood normal.        Behavior: Behavior normal.    Assessment & Plan:  1: Chronic heart failure with reduced ejection fraction- - NYHA class II - euvolemic today - weighing daily and says that her weight has been stable; reminded to call for an overnight weight gain of > 2 pounds or a weekly weight gain of > 5 pounds - not adding much salt to her food; reviewed the importance of reading food labels; dietary information & low sodium cookbook provided to patient - current BP will not allow for transition from lisinopril to entresto - discussed changing her BID metoprolol to daily metoprolol at next visit - saw cardiology Zenia Resides) 02/12/20 - BP may not allow for spironolactone  - wearing lifevest - BNP 02/12/20 was 794.8   2: HTN- - BP on the low side today - saw PCP Marlyn Corporal) 02/18/20 - BMP 02/22/20 reviewed and showed sodium 134, potassium 3.9, creatinine 1.16 and GFR 49  3: CAD- - stent placed in LAD - currently on both xarelto and brilinta  Patient did not bring her medications nor a list. Each medication was verbally reviewed with the patient and she was encouraged to bring the bottles to every visit to confirm accuracy of list.  Return in 1 month or sooner for any questions/problems  before then.

## 2020-03-02 ENCOUNTER — Encounter: Payer: Self-pay | Admitting: Physician Assistant

## 2020-03-03 ENCOUNTER — Other Ambulatory Visit: Payer: Self-pay | Admitting: Physician Assistant

## 2020-03-03 DIAGNOSIS — K219 Gastro-esophageal reflux disease without esophagitis: Secondary | ICD-10-CM

## 2020-03-04 ENCOUNTER — Telehealth: Payer: Self-pay

## 2020-03-04 NOTE — Telephone Encounter (Signed)
Copied from [place] [id number]. Topic: General - Other >> Mar 04, 2020  4:05 PM Leward Quan A wrote: Reason for CRM: Patient called in to ask for Fenton Malling to give her a call back did not say what for. Please call  Ph# [phone]

## 2020-03-05 NOTE — Telephone Encounter (Signed)
Contacted patient to inquire what call is about, she requested to only speak with Kayla Maxwell, she request a call back when provider is available, she would not discuss further what this was in regards to. KW

## 2020-03-07 ENCOUNTER — Encounter: Payer: Self-pay | Admitting: Physician Assistant

## 2020-03-13 ENCOUNTER — Other Ambulatory Visit: Payer: Self-pay | Admitting: Physician Assistant

## 2020-03-13 ENCOUNTER — Telehealth: Payer: Self-pay

## 2020-03-13 DIAGNOSIS — I10 Essential (primary) hypertension: Secondary | ICD-10-CM

## 2020-03-13 NOTE — Telephone Encounter (Signed)
Called patient and talked over phone. Answered all questions.

## 2020-03-13 NOTE — Telephone Encounter (Signed)
Copied from [place] [phone]. Topic: General - Other >> Mar 13, 2020  9:49 AM Yvette Rack wrote: Reason for CRM: Pt requests call back from [facility] nurse to discuss Anderson Malta leaving as well as some questions pt has about her medications. Pt declined to provide more details and insisted that Claybon Jabs nurse call her back. Pt requests call back

## 2020-03-13 NOTE — Telephone Encounter (Signed)
  Notes to clinic:this script is expired  Review for continue use    Requested Prescriptions  Pending Prescriptions Disp Refills   metoprolol tartrate (LOPRESSOR) 25 MG tablet [Pharmacy Med Name: METOPROLOL TARTRATE 25 MG TAB] 360 tablet 1    Sig: TAKE 2 TABLETS BY MOUTH TWICE A DAY      Cardiovascular:  Beta Blockers Passed - 03/13/2020  9:14 AM      Passed - Last BP in normal range    BP Readings from Last 1 Encounters:  02/29/20 98/70          Passed - Last Heart Rate in normal range    Pulse Readings from Last 1 Encounters:  02/29/20 74          Passed - Valid encounter within last 6 months    Recent Outpatient Visits           3 weeks ago Nasal congestion   [place], [place]   3 weeks ago ST elevation myocardial infarction involving left anterior descending (LAD) coronary artery [facility])   [facility] [place], [place], [place]   6 months ago Diverticulitis   [place], [place]   7 months ago Leukocytosis, unspecified type   [place], [place]   10 months ago Benign essential HTN   [facility] [place], [place], [place]

## 2020-03-14 NOTE — Telephone Encounter (Signed)
Spoke with patient last night and answered all questions.

## 2020-03-17 ENCOUNTER — Encounter: Payer: Medicare Other | Attending: Internal Medicine | Admitting: *Deleted

## 2020-03-17 ENCOUNTER — Other Ambulatory Visit: Payer: Self-pay

## 2020-03-17 DIAGNOSIS — Z955 Presence of coronary angioplasty implant and graft: Secondary | ICD-10-CM | POA: Insufficient documentation

## 2020-03-17 DIAGNOSIS — I213 ST elevation (STEMI) myocardial infarction of unspecified site: Secondary | ICD-10-CM | POA: Insufficient documentation

## 2020-03-17 NOTE — Progress Notes (Signed)
Initial telelphone orientation completed. Diagnosis can be found in [facility] 2/8. EP orientation scheduled for Monday 3/21 at 10am.

## 2020-03-20 DIAGNOSIS — I5021 Acute systolic (congestive) heart failure: Secondary | ICD-10-CM | POA: Insufficient documentation

## 2020-03-20 DIAGNOSIS — I25118 Atherosclerotic heart disease of native coronary artery with other forms of angina pectoris: Secondary | ICD-10-CM | POA: Diagnosis not present

## 2020-03-20 DIAGNOSIS — I482 Chronic atrial fibrillation, unspecified: Secondary | ICD-10-CM | POA: Diagnosis not present

## 2020-03-20 DIAGNOSIS — E782 Mixed hyperlipidemia: Secondary | ICD-10-CM | POA: Diagnosis not present

## 2020-03-20 DIAGNOSIS — I10 Essential (primary) hypertension: Secondary | ICD-10-CM | POA: Diagnosis not present

## 2020-03-24 ENCOUNTER — Telehealth: Payer: Self-pay | Admitting: Physician Assistant

## 2020-03-24 ENCOUNTER — Other Ambulatory Visit: Payer: Self-pay

## 2020-03-24 ENCOUNTER — Encounter: Payer: Medicare Other | Admitting: *Deleted

## 2020-03-24 VITALS — Ht 63.9 in | Wt 168.7 lb

## 2020-03-24 DIAGNOSIS — Z955 Presence of coronary angioplasty implant and graft: Secondary | ICD-10-CM

## 2020-03-24 DIAGNOSIS — I213 ST elevation (STEMI) myocardial infarction of unspecified site: Secondary | ICD-10-CM | POA: Diagnosis not present

## 2020-03-24 DIAGNOSIS — I70222 Atherosclerosis of native arteries of extremities with rest pain, left leg: Secondary | ICD-10-CM

## 2020-03-24 MED ORDER — HYDROCODONE-ACETAMINOPHEN 5-325 MG PO TABS: 1.0000 | ORAL_TABLET | Freq: Three times a day (TID) | ORAL | 0 refills | Status: DC | PRN

## 2020-03-24 NOTE — Patient Instructions (Signed)
Patient Instructions  Patient Details  Name: Kayla Maxwell MRN: [id number] Date of Birth: January 13, 1945 Referring Provider:  Corey Skains, MD  Below are your personal goals for exercise, nutrition, and risk factors. Our goal is to help you stay on track towards obtaining and maintaining these goals. We will be discussing your progress on these goals with you throughout the program.  Initial Exercise Prescription:  Initial Exercise Prescription - 03/24/20 1400      Date of Initial Exercise RX and Referring Provider   Date 03/24/20    Referring Provider Serafina Royals  MD      Treadmill   MPH 0.8    Grade 0    Minutes 5    METs 1.6      Recumbant Bike   Level 1    RPM 50    Watts 10    Minutes 15    METs 1.5      NuStep   Level 1    SPM 80    Minutes 15    METs 1.5      REL-XR   Level 1    Speed 50    Minutes 15    METs 1.5      Prescription Details   Frequency (times per week) 2    Duration Progress to 30 minutes of continuous aerobic without signs/symptoms of physical distress      Intensity   THRR 40-80% of Max Heartrate 106-133    Ratings of Perceived Exertion 11-13    Perceived Dyspnea 0-4      Progression   Progression Continue to progress workloads to maintain intensity without signs/symptoms of physical distress.      Resistance Training   Training Prescription Yes    Weight 3 lb    Reps 10-15           Exercise Goals: Frequency: Be able to perform aerobic exercise two to three times per week in program working toward 2-5 days per week of home exercise.  Intensity: Work with a perceived exertion of 11 (fairly light) - 15 (hard) while following your exercise prescription.  We will make changes to your prescription with you as you progress through the program.   Duration: Be able to do 30 to 45 minutes of continuous aerobic exercise in addition to a 5 minute warm-up and a 5 minute cool-down routine.   Nutrition Goals: Your personal  nutrition goals will be established when you do your nutrition analysis with the dietician.  The following are general nutrition guidelines to follow: Cholesterol < 200mg /day Sodium < 1500mg /day Fiber: Women over 50 yrs - 21 grams per day  Personal Goals:  Personal Goals and Risk Factors at Admission - 03/24/20 1420      Core Components/Risk Factors/Patient Goals on Admission    Weight Management Yes;Weight Loss;Weight Maintenance    Intervention Weight Management: Develop a combined nutrition and exercise program designed to reach desired caloric intake, while maintaining appropriate intake of nutrient and fiber, sodium and fats, and appropriate energy expenditure required for the weight goal.;Weight Management: Provide education and appropriate resources to help participant work on and attain dietary goals.;Weight Management/Obesity: Establish reasonable short term and long term weight goals.    Admit Weight 168 lb 11.2 oz (76.5 kg)    Goal Weight: Short Term 165 lb (74.8 kg)    Goal Weight: Long Term 165 lb (74.8 kg)    Expected Outcomes Long Term: Adherence to nutrition and physical activity/exercise program aimed toward  attainment of established weight goal;Short Term: Continue to assess and modify interventions until short term weight is achieved;Weight Maintenance: Understanding of the daily nutrition guidelines, which includes 25-35% calories from fat, 7% or less cal from saturated fats, less than 200mg  cholesterol, less than 1.5gm of sodium, & 5 or more servings of fruits and vegetables daily;Understanding recommendations for meals to include 15-35% energy as protein, 25-35% energy from fat, 35-60% energy from carbohydrates, less than 200mg  of dietary cholesterol, 20-35 gm of total fiber daily;Weight Loss: Understanding of general recommendations for a balanced deficit meal plan, which promotes 1-2 lb weight loss per week and includes a negative energy balance of [phone]  kcal/d;Understanding of distribution of calorie intake throughout the day with the consumption of 4-5 meals/snacks    Heart Failure Yes    Intervention Provide a combined exercise and nutrition program that is supplemented with education, support and counseling about heart failure. Directed toward relieving symptoms such as shortness of breath, decreased exercise tolerance, and extremity edema.    Expected Outcomes Improve functional capacity of life;Short term: Attendance in program 2-3 days a week with increased exercise capacity. Reported lower sodium intake. Reported increased fruit and vegetable intake. Reports medication compliance.;Short term: Daily weights obtained and reported for increase. Utilizing diuretic protocols set by physician.;Long term: Adoption of self-care skills and reduction of barriers for early signs and symptoms recognition and intervention leading to self-care maintenance.    Hypertension Yes    Intervention Provide education on lifestyle modifcations including regular physical activity/exercise, weight management, moderate sodium restriction and increased consumption of fresh fruit, vegetables, and low fat dairy, alcohol moderation, and smoking cessation.;Monitor prescription use compliance.    Expected Outcomes Short Term: Continued assessment and intervention until BP is < 140/4mm HG in hypertensive participants. < 130/47mm HG in hypertensive participants with diabetes, heart failure or chronic kidney disease.;Long Term: Maintenance of blood pressure at goal levels.    Lipids Yes    Intervention Provide education and support for participant on nutrition & aerobic/resistive exercise along with prescribed medications to achieve LDL 70mg , HDL >40mg .    Expected Outcomes Short Term: Participant states understanding of desired cholesterol values and is compliant with medications prescribed. Participant is following exercise prescription and nutrition guidelines.;Long Term:  Cholesterol controlled with medications as prescribed, with individualized exercise RX and with personalized nutrition plan. Value goals: LDL < 70mg , HDL > 40 mg.           Tobacco Use Initial Evaluation: Social History   Tobacco Use  Smoking Status Former Smoker  . Packs/day: 1.00  . Years: 47.00  . Pack years: 47.00  . Quit date: 08/19/2014  . Years since quitting: 5.6  Smokeless Tobacco Former Systems developer  . Quit date: 08/19/2014    Exercise Goals and Review:  Exercise Goals    Row Name 03/24/20 1415             Exercise Goals   Increase Physical Activity Yes       Intervention Provide advice, education, support and counseling about physical activity/exercise needs.;Develop an individualized exercise prescription for aerobic and resistive training based on initial evaluation findings, risk stratification, comorbidities and participant's personal goals.       Expected Outcomes Short Term: Attend rehab on a regular basis to increase amount of physical activity.;Long Term: Exercising regularly at least 3-5 days a week.;Long Term: Add in home exercise to make exercise part of routine and to increase amount of physical activity.       Increase  Strength and Stamina Yes       Intervention Provide advice, education, support and counseling about physical activity/exercise needs.;Develop an individualized exercise prescription for aerobic and resistive training based on initial evaluation findings, risk stratification, comorbidities and participant's personal goals.       Expected Outcomes Short Term: Increase workloads from initial exercise prescription for resistance, speed, and METs.;Short Term: Perform resistance training exercises routinely during rehab and add in resistance training at home;Long Term: Improve cardiorespiratory fitness, muscular endurance and strength as measured by increased METs and functional capacity (6MWT)       Able to understand and use rate of perceived exertion (RPE)  scale Yes       Intervention Provide education and explanation on how to use RPE scale       Expected Outcomes Short Term: Able to use RPE daily in rehab to express subjective intensity level;Long Term:  Able to use RPE to guide intensity level when exercising independently       Able to understand and use Dyspnea scale Yes       Intervention Provide education and explanation on how to use Dyspnea scale       Expected Outcomes Short Term: Able to use Dyspnea scale daily in rehab to express subjective sense of shortness of breath during exertion;Long Term: Able to use Dyspnea scale to guide intensity level when exercising independently       Knowledge and understanding of Target Heart Rate Range (THRR) Yes       Intervention Provide education and explanation of THRR including how the numbers were predicted and where they are located for reference       Expected Outcomes Short Term: Able to state/look up THRR;Short Term: Able to use daily as guideline for intensity in rehab;Long Term: Able to use THRR to govern intensity when exercising independently       Able to check pulse independently Yes       Intervention Provide education and demonstration on how to check pulse in carotid and radial arteries.;Review the importance of being able to check your own pulse for safety during independent exercise       Expected Outcomes Short Term: Able to explain why pulse checking is important during independent exercise;Long Term: Able to check pulse independently and accurately       Understanding of Exercise Prescription Yes       Intervention Provide education, explanation, and written materials on patient's individual exercise prescription       Expected Outcomes Short Term: Able to explain program exercise prescription;Long Term: Able to explain home exercise prescription to exercise independently              Copy of goals given to participant.

## 2020-03-24 NOTE — Telephone Encounter (Signed)
refilled 

## 2020-03-24 NOTE — Telephone Encounter (Signed)
Medication Refill - Medication: Hydrocodone 5/325  Has the patient contacted their pharmacy? No. (Agent: If no, request that the patient contact the pharmacy for the refill.) (Agent: If yes, when and what did the pharmacy advise?)  Preferred Pharmacy (with phone number or street name): [facility]  Agent: Please be advised that RX refills may take up to 3 business days. We ask that you follow-up with your pharmacy.

## 2020-03-24 NOTE — Progress Notes (Signed)
Cardiac Individual Treatment Plan  Patient Details  Name: Kayla Maxwell MRN: [id number] Date of Birth: 27-Dec-1945 Referring Provider:   Flowsheet Row Cardiac Rehab from 03/24/2020 in [facility] Cardiac and Pulmonary Rehab  Referring Provider Serafina Royals  MD      Initial Encounter Date:  Flowsheet Row Cardiac Rehab from 03/24/2020 in [facility] Cardiac and Pulmonary Rehab  Date 03/24/20      Visit Diagnosis: ST elevation myocardial infarction (STEMI), unspecified artery [facility])  Status post coronary artery stent placement  Patient's Home Medications on Admission:  Current Outpatient Medications:  .  albuterol (PROVENTIL HFA;VENTOLIN HFA) 108 (90 Base) MCG/ACT inhaler, Inhale 2 puffs into the lungs every 6 (six) hours as needed for wheezing or shortness of breath., Disp: 1 Inhaler, Rfl: 5 .  aspirin EC 81 MG EC tablet, Take 1 tablet (81 mg total) by mouth daily., Disp: 30 tablet, Rfl: 1 .  dapagliflozin propanediol (FARXIGA) 5 MG TABS tablet, Take 1 tablet (5 mg total) by mouth daily., Disp: 30 tablet, Rfl: 2 .  FLUoxetine (PROZAC) 20 MG capsule, TAKE 1 CAPSULE BY MOUTH EVERY DAY, Disp: 90 capsule, Rfl: 1 .  furosemide (LASIX) 20 MG tablet, Take 1 tablet (20 mg total) by mouth daily as needed for fluid or edema., Disp: 30 tablet, Rfl: 2 .  gabapentin (NEURONTIN) 300 MG capsule, TAKE 1 CAPSULE BY MOUTH EVERYDAY AT BEDTIME (Patient taking differently: Take 300 mg by mouth at bedtime.), Disp: 30 capsule, Rfl: 3 .  HYDROcodone-acetaminophen (NORCO) 5-325 MG tablet, Take 1-2 tablets by mouth every 8 (eight) hours as needed for moderate pain or severe pain., Disp: 90 tablet, Rfl: 0 .  lisinopril (ZESTRIL) 2.5 MG tablet, TAKE 1 TABLET BY MOUTH EVERY DAY, Disp: 90 tablet, Rfl: 1 .  metoprolol tartrate (LOPRESSOR) 25 MG tablet, TAKE 2 TABLETS BY MOUTH TWICE A DAY, Disp: 360 tablet, Rfl: 1 .  midodrine (PROAMATINE) 2.5 MG tablet, Take 1 tablet (2.5 mg total) by mouth 3 (three) times daily with meals., Disp:  90 tablet, Rfl: 1 .  nitroGLYCERIN (NITROSTAT) 0.4 MG SL tablet, Place 1 tablet (0.4 mg total) under the tongue every 5 (five) minutes x 3 doses as needed for chest pain., Disp: 30 tablet, Rfl: 12 .  omeprazole (PRILOSEC) 40 MG capsule, TAKE 1 CAPSULE BY MOUTH EVERY DAY, Disp: 90 capsule, Rfl: 1 .  ondansetron (ZOFRAN ODT) 4 MG disintegrating tablet, Take 1 tablet (4 mg total) by mouth every 8 (eight) hours as needed., Disp: 20 tablet, Rfl: 0 .  rivaroxaban (XARELTO) 20 MG TABS tablet, Take 20 mg by mouth daily with supper. , Disp: , Rfl:  .  rosuvastatin (CRESTOR) 20 MG tablet, Take 1 tablet (20 mg total) by mouth daily., Disp: 90 tablet, Rfl: 1 .  ticagrelor (BRILINTA) 90 MG TABS tablet, Take 1 tablet (90 mg total) by mouth 2 (two) times daily., Disp: 60 tablet, Rfl: 11  Past Medical History: Past Medical History:  Diagnosis Date  . Acid reflux    takes Nexium daily  . Allergy    takes Singulair daily as needed  . Anemia   . Atrial fibrillation ([place])   . Cancer ([place]) AGE 75   CERVICAL cancer with removal  . CHF (congestive heart failure) ([place])   . Chronic back pain    stenosis  . Complication of anesthesia   . Constipation    takes Colace daily as needed  . Coronary artery disease   . Difficult intubation   . Dysrhythmia  atrial fibrillation dx 04/2014  . Headache    sinus   . History of blood transfusion   . History of shingles   . Hyperlipidemia    takes Pravastatin daily-started taking this 6 months ago  . Hypertension   . Lumbar surgical wound fluid collection   . Sleep apnea    CPAP  . Stroke ([place])    TIA'S X 2 --- one last yr and one this yr...lost her memory over 4-6 hrs.  . Stroke ([place]) 06-04-15   left with right side weakness  . TIA (transient ischemic attack)    showed up on an MRI but she never knew it  . Urinary frequency     Tobacco Use: Social History   Tobacco Use  Smoking Status Former Smoker  . Packs/day: 1.00  . Years: 47.00  . Pack years:  47.00  . Quit date: 08/19/2014  . Years since quitting: 5.6  Smokeless Tobacco Former Systems developer  . Quit date: 08/19/2014    Labs: Recent Review Flowsheet Data    Labs for ITP Cardiac and Pulmonary Rehab Latest Ref Rng & Units 04/11/2015 06/06/2015 06/30/2015 04/04/2017 02/13/2020   Cholestrol 0 - 200 mg/dL - - 138 134 106   LDLCALC 0 - 99 mg/dL - - 47 50 53   HDL >40 mg/dL - - 55 51 36(L)   Trlycerides <150 mg/dL - - 180(H) 165(H) 87   Hemoglobin A1c 4.8 - 5.6 % 5.8 5.8 - 6.0(H) -       Exercise Target Goals: Exercise Program Goal: Individual exercise prescription set using results from initial 6 min walk test and THRR while considering  patient's activity barriers and safety.   Exercise Prescription Goal: Initial exercise prescription builds to 30-45 minutes a day of aerobic activity, 2-3 days per week.  Home exercise guidelines will be given to patient during program as part of exercise prescription that the participant will acknowledge.   Education: Aerobic Exercise: - Group verbal and visual presentation on the components of exercise prescription. Introduces F.I.T.T principle from ACSM for exercise prescriptions.  Reviews F.I.T.T. principles of aerobic exercise including progression. Written material given at graduation. Flowsheet Row Cardiac Rehab from 03/24/2020 in [facility] Cardiac and Pulmonary Rehab  Education need identified 03/24/20      Education: Resistance Exercise: - Group verbal and visual presentation on the components of exercise prescription. Introduces F.I.T.T principle from ACSM for exercise prescriptions  Reviews F.I.T.T. principles of resistance exercise including progression. Written material given at graduation.    Education: Exercise & Equipment Safety: - Individual verbal instruction and demonstration of equipment use and safety with use of the equipment. Flowsheet Row Cardiac Rehab from 03/24/2020 in [facility] Cardiac and Pulmonary Rehab  Date 03/24/20  Educator [facility]   Instruction Review Code 1- Verbalizes Understanding      Education: Exercise Physiology & General Exercise Guidelines: - Group verbal and written instruction with models to review the exercise physiology of the cardiovascular system and associated critical values. Provides general exercise guidelines with specific guidelines to those with heart or lung disease.    Education: Flexibility, Balance, Mind/Body Relaxation: - Group verbal and visual presentation with interactive activity on the components of exercise prescription. Introduces F.I.T.T principle from ACSM for exercise prescriptions. Reviews F.I.T.T. principles of flexibility and balance exercise training including progression. Also discusses the mind body connection.  Reviews various relaxation techniques to help reduce and manage stress (i.e. Deep breathing, progressive muscle relaxation, and visualization). Balance handout provided to take home. Written material  given at graduation.   Activity Barriers & Risk Stratification:  Activity Barriers & Cardiac Risk Stratification - 03/24/20 1410      Activity Barriers & Cardiac Risk Stratification   Activity Barriers Back Problems;Other (comment);Deconditioning;Muscular Weakness;Shortness of Breath;Balance Concerns    Comments hx of stent in groin, nerve pain, chronic back pain, and pain in feet    Cardiac Risk Stratification High           6 Minute Walk:  6 Minute Walk    Row Name 03/24/20 1409         6 Minute Walk   Phase Initial     Distance 400 feet     Walk Time 4.02 minutes     # of Rest Breaks 5  5 sec, 7 sec, 7 sec, 1:10, 8 sec, stopped at 5:42     MPH 1.13     METS 1.09     RPE 17     Perceived Dyspnea  3     VO2 Peak 3.84     Symptoms Yes (comment)     Comments SOB, chronic back pain 6/10     Resting HR 79 bpm     Resting BP 134/64     Resting Oxygen Saturation  93 %     Exercise Oxygen Saturation  during 6 min walk 91 %     Max Ex. HR 100 bpm     Max  Ex. BP 148/82     2 Minute Post BP 122/66            Oxygen Initial Assessment:   Oxygen Re-Evaluation:   Oxygen Discharge (Final Oxygen Re-Evaluation):   Initial Exercise Prescription:  Initial Exercise Prescription - 03/24/20 1400      Date of Initial Exercise RX and Referring Provider   Date 03/24/20    Referring Provider Serafina Royals  MD      Treadmill   MPH 0.8    Grade 0    Minutes 5    METs 1.6      Recumbant Bike   Level 1    RPM 50    Watts 10    Minutes 15    METs 1.5      NuStep   Level 1    SPM 80    Minutes 15    METs 1.5      REL-XR   Level 1    Speed 50    Minutes 15    METs 1.5      Prescription Details   Frequency (times per week) 2    Duration Progress to 30 minutes of continuous aerobic without signs/symptoms of physical distress      Intensity   THRR 40-80% of Max Heartrate 106-133    Ratings of Perceived Exertion 11-13    Perceived Dyspnea 0-4      Progression   Progression Continue to progress workloads to maintain intensity without signs/symptoms of physical distress.      Resistance Training   Training Prescription Yes    Weight 3 lb    Reps 10-15           Perform Capillary Blood Glucose checks as needed.  Exercise Prescription Changes:  Exercise Prescription Changes    Row Name 03/24/20 1400             Response to Exercise   Blood Pressure (Admit) 134/64       Blood Pressure (Exercise) 148/82       Blood  Pressure (Exit) 122/66       Heart Rate (Admit) 79 bpm       Heart Rate (Exercise) 100 bpm       Heart Rate (Exit) 85 bpm       Oxygen Saturation (Admit) 93 %       Oxygen Saturation (Exercise) 91 %       Rating of Perceived Exertion (Exercise) 17       Perceived Dyspnea (Exercise) 3       Symptoms SOB, chronic back pain 6/10       Comments walk test results              Exercise Comments:   Exercise Goals and Review:  Exercise Goals    Row Name 03/24/20 1415             Exercise  Goals   Increase Physical Activity Yes       Intervention Provide advice, education, support and counseling about physical activity/exercise needs.;Develop an individualized exercise prescription for aerobic and resistive training based on initial evaluation findings, risk stratification, comorbidities and participant's personal goals.       Expected Outcomes Short Term: Attend rehab on a regular basis to increase amount of physical activity.;Long Term: Exercising regularly at least 3-5 days a week.;Long Term: Add in home exercise to make exercise part of routine and to increase amount of physical activity.       Increase Strength and Stamina Yes       Intervention Provide advice, education, support and counseling about physical activity/exercise needs.;Develop an individualized exercise prescription for aerobic and resistive training based on initial evaluation findings, risk stratification, comorbidities and participant's personal goals.       Expected Outcomes Short Term: Increase workloads from initial exercise prescription for resistance, speed, and METs.;Short Term: Perform resistance training exercises routinely during rehab and add in resistance training at home;Long Term: Improve cardiorespiratory fitness, muscular endurance and strength as measured by increased METs and functional capacity (6MWT)       Able to understand and use rate of perceived exertion (RPE) scale Yes       Intervention Provide education and explanation on how to use RPE scale       Expected Outcomes Short Term: Able to use RPE daily in rehab to express subjective intensity level;Long Term:  Able to use RPE to guide intensity level when exercising independently       Able to understand and use Dyspnea scale Yes       Intervention Provide education and explanation on how to use Dyspnea scale       Expected Outcomes Short Term: Able to use Dyspnea scale daily in rehab to express subjective sense of shortness of breath during  exertion;Long Term: Able to use Dyspnea scale to guide intensity level when exercising independently       Knowledge and understanding of Target Heart Rate Range (THRR) Yes       Intervention Provide education and explanation of THRR including how the numbers were predicted and where they are located for reference       Expected Outcomes Short Term: Able to state/look up THRR;Short Term: Able to use daily as guideline for intensity in rehab;Long Term: Able to use THRR to govern intensity when exercising independently       Able to check pulse independently Yes       Intervention Provide education and demonstration on how to check pulse in carotid and radial arteries.;Review the importance  of being able to check your own pulse for safety during independent exercise       Expected Outcomes Short Term: Able to explain why pulse checking is important during independent exercise;Long Term: Able to check pulse independently and accurately       Understanding of Exercise Prescription Yes       Intervention Provide education, explanation, and written materials on patient's individual exercise prescription       Expected Outcomes Short Term: Able to explain program exercise prescription;Long Term: Able to explain home exercise prescription to exercise independently              Exercise Goals Re-Evaluation :   Discharge Exercise Prescription (Final Exercise Prescription Changes):  Exercise Prescription Changes - 03/24/20 1400      Response to Exercise   Blood Pressure (Admit) 134/64    Blood Pressure (Exercise) 148/82    Blood Pressure (Exit) 122/66    Heart Rate (Admit) 79 bpm    Heart Rate (Exercise) 100 bpm    Heart Rate (Exit) 85 bpm    Oxygen Saturation (Admit) 93 %    Oxygen Saturation (Exercise) 91 %    Rating of Perceived Exertion (Exercise) 17    Perceived Dyspnea (Exercise) 3    Symptoms SOB, chronic back pain 6/10    Comments walk test results           Nutrition:  Target  Goals: Understanding of nutrition guidelines, daily intake of sodium 1500mg , cholesterol 200mg , calories 30% from fat and 7% or less from saturated fats, daily to have 5 or more servings of fruits and vegetables.  Education: All About Nutrition: -Group instruction provided by verbal, written material, interactive activities, discussions, models, and posters to present general guidelines for heart healthy nutrition including fat, fiber, MyPlate, the role of sodium in heart healthy nutrition, utilization of the nutrition label, and utilization of this knowledge for meal planning. Follow up email sent as well. Written material given at graduation. Flowsheet Row Cardiac Rehab from 03/24/2020 in [facility] Cardiac and Pulmonary Rehab  Education need identified 03/24/20      Biometrics:  Pre Biometrics - 03/24/20 1416      Pre Biometrics   Height 5' 3.9" (1.623 m)    Weight 168 lb 11.2 oz (76.5 kg)    BMI (Calculated) 29.05    Single Leg Stand 3.2 seconds            Nutrition Therapy Plan and Nutrition Goals:   Nutrition Assessments:  MEDIFICTS Score Key:  ?70 Need to make dietary changes   40-70 Heart Healthy Diet  ? 40 Therapeutic Level Cholesterol Diet  Flowsheet Row Cardiac Rehab from 03/24/2020 in [facility] Cardiac and Pulmonary Rehab  Picture Your Plate Total Score on Admission 74     Picture Your Plate Scores:  <54 Unhealthy dietary pattern with much room for improvement.  41-50 Dietary pattern unlikely to meet recommendations for good health and room for improvement.  51-60 More healthful dietary pattern, with some room for improvement.   >60 Healthy dietary pattern, although there may be some specific behaviors that could be improved.    Nutrition Goals Re-Evaluation:   Nutrition Goals Discharge (Final Nutrition Goals Re-Evaluation):   Psychosocial: Target Goals: Acknowledge presence or absence of significant depression and/or stress, maximize coping skills, provide  positive support system. Participant is able to verbalize types and ability to use techniques and skills needed for reducing stress and depression.   Education: Stress, Anxiety, and Depression - Group  verbal and visual presentation to define topics covered.  Reviews how body is impacted by stress, anxiety, and depression.  Also discusses healthy ways to reduce stress and to treat/manage anxiety and depression.  Written material given at graduation.   Education: Sleep Hygiene -Provides group verbal and written instruction about how sleep can affect your health.  Define sleep hygiene, discuss sleep cycles and impact of sleep habits. Review good sleep hygiene tips.    Initial Review & Psychosocial Screening:  Initial Psych Review & Screening - 03/17/20 1413      Initial Review   Current issues with Current Anxiety/Panic;Current Stress Concerns    Source of Stress Concerns Chronic Illness      Family Dynamics   Good Support System? Yes   sister     Barriers   Psychosocial barriers to participate in program There are no identifiable barriers or psychosocial needs.;The patient should benefit from training in stress management and relaxation.      Screening Interventions   Interventions To provide support and resources with identified psychosocial needs;Encouraged to exercise;Provide feedback about the scores to participant    Expected Outcomes Short Term goal: Utilizing psychosocial counselor, staff and physician to assist with identification of specific Stressors or current issues interfering with healing process. Setting desired goal for each stressor or current issue identified.;Long Term Goal: Stressors or current issues are controlled or eliminated.;Short Term goal: Identification and review with participant of any Quality of Life or Depression concerns found by scoring the questionnaire.;Long Term goal: The participant improves quality of Life and PHQ9 Scores as seen by post scores and/or  verbalization of changes           Quality of Life Scores:   Quality of Life - 03/24/20 1416      Quality of Life   Select Quality of Life      Quality of Life Scores   Health/Function Pre 9.97 %    Socioeconomic Pre 30 %    Psych/Spiritual Pre 15.43 %    Family Pre 27.6 %    GLOBAL Pre 17.81 %          Scores of 19 and below usually indicate a poorer quality of life in these areas.  A difference of  2-3 points is a clinically meaningful difference.  A difference of 2-3 points in the total score of the Quality of Life Index has been associated with significant improvement in overall quality of life, self-image, physical symptoms, and general health in studies assessing change in quality of life.  PHQ-9: Recent Review Flowsheet Data    Depression screen [facility] 2/9 03/24/2020 02/18/2020 08/23/2019 08/22/2018 03/09/2017   Decreased Interest 1 0 0 0 0   Down, Depressed, Hopeless 1 0 0 0 1   PHQ - 2 Score 2 0 0 0 1   Altered sleeping 0 1 - - -   Tired, decreased energy 3 3 - - -   Change in appetite 2 2 - - -   Feeling bad or failure about yourself  1 0 - - -   Trouble concentrating 0 0 - - -   Moving slowly or fidgety/restless 0 0 - - -   Suicidal thoughts 0 0 - - -   PHQ-9 Score 8 6 - - -   Difficult doing work/chores Somewhat difficult Not difficult at all - - -     Interpretation of Total Score  Total Score Depression Severity:  1-4 = Minimal depression, 5-9 = Mild depression,  10-14 = Moderate depression, 15-19 = Moderately severe depression, 20-27 = Severe depression   Psychosocial Evaluation and Intervention:  Psychosocial Evaluation - 03/17/20 1430      Psychosocial Evaluation & Interventions   Interventions Stress management education;Relaxation education;Encouraged to exercise with the program and follow exercise prescription    Comments Ms. Dufner reports doing okay post MI. She Lorek is having issues sleeping, some of it related to anxiety from her CPAP mask. She doesn't  report any pain, but increased weakness. Her sister is her main support system and helps her daily. She is looking forward to coming to Cardiac Rehab for education and exercise to boost her stamina and strength. She does struggle with a decreased appetite which is new post MI.    Expected Outcomes Short: attend cardiac rehab for education and exercise. Long: develop and maintain positive self care    Continue Psychosocial Services  Follow up required by staff           Psychosocial Re-Evaluation:   Psychosocial Discharge (Final Psychosocial Re-Evaluation):   Vocational Rehabilitation: Provide vocational rehab assistance to qualifying candidates.   Vocational Rehab Evaluation & Intervention:  Vocational Rehab - 03/17/20 1413      Initial Vocational Rehab Evaluation & Intervention   Assessment shows need for Vocational Rehabilitation No           Education: Education Goals: Education classes will be provided on a variety of topics geared toward better understanding of heart health and risk factor modification. Participant will state understanding/return demonstration of topics presented as noted by education test scores.  Learning Barriers/Preferences:  Learning Barriers/Preferences - 03/17/20 1413      Learning Barriers/Preferences   Learning Barriers None    Learning Preferences None           General Cardiac Education Topics:  AED/CPR: - Group verbal and written instruction with the use of models to demonstrate the basic use of the AED with the basic ABC's of resuscitation.   Anatomy and Cardiac Procedures: - Group verbal and visual presentation and models provide information about basic cardiac anatomy and function. Reviews the testing methods done to diagnose heart disease and the outcomes of the test results. Describes the treatment choices: Medical Management, Angioplasty, or Coronary Bypass Surgery for treating various heart conditions including Myocardial  Infarction, Angina, Valve Disease, and Cardiac Arrhythmias.  Written material given at graduation. Flowsheet Row Cardiac Rehab from 03/24/2020 in [facility] Cardiac and Pulmonary Rehab  Education need identified 03/24/20      Medication Safety: - Group verbal and visual instruction to review commonly prescribed medications for heart and lung disease. Reviews the medication, class of the drug, and side effects. Includes the steps to properly store meds and maintain the prescription regimen.  Written material given at graduation.   Intimacy: - Group verbal instruction through game format to discuss how heart and lung disease can affect sexual intimacy. Written material given at graduation..   Know Your Numbers and Heart Failure: - Group verbal and visual instruction to discuss disease risk factors for cardiac and pulmonary disease and treatment options.  Reviews associated critical values for Overweight/Obesity, Hypertension, Cholesterol, and Diabetes.  Discusses basics of heart failure: signs/symptoms and treatments.  Introduces Heart Failure Zone chart for action plan for heart failure.  Written material given at graduation.   Infection Prevention: - Provides verbal and written material to individual with discussion of infection control including proper hand washing and proper equipment cleaning during exercise session. [place] [facility]  from 03/24/2020 in [facility] Cardiac and Pulmonary Rehab  Date 03/24/20  Educator [facility]  Instruction Review Code 1- Verbalizes Understanding      Falls Prevention: - Provides verbal and written material to individual with discussion of falls prevention and safety. Flowsheet Row Cardiac Rehab from 03/24/2020 in [facility] Cardiac and Pulmonary Rehab  Date 03/24/20  Educator [facility]  Instruction Review Code 1- Verbalizes Understanding      Other: -Provides group and verbal instruction on various topics (see comments)   Knowledge Questionnaire Score:  Knowledge  Questionnaire Score - 03/24/20 1419      Knowledge Questionnaire Score   Pre Score 23/26 Education Focus: angina, nurtition, exercise           Core Components/Risk Factors/Patient Goals at Admission:  Personal Goals and Risk Factors at Admission - 03/24/20 1420      Core Components/Risk Factors/Patient Goals on Admission    Weight Management Yes;Weight Loss;Weight Maintenance    Intervention Weight Management: Develop a combined nutrition and exercise program designed to reach desired caloric intake, while maintaining appropriate intake of nutrient and fiber, sodium and fats, and appropriate energy expenditure required for the weight goal.;Weight Management: Provide education and appropriate resources to help participant work on and attain dietary goals.;Weight Management/Obesity: Establish reasonable short term and long term weight goals.    Admit Weight 168 lb 11.2 oz (76.5 kg)    Goal Weight: Short Term 165 lb (74.8 kg)    Goal Weight: Long Term 165 lb (74.8 kg)    Expected Outcomes Long Term: Adherence to nutrition and physical activity/exercise program aimed toward attainment of established weight goal;Short Term: Continue to assess and modify interventions until short term weight is achieved;Weight Maintenance: Understanding of the daily nutrition guidelines, which includes 25-35% calories from fat, 7% or less cal from saturated fats, less than 200mg  cholesterol, less than 1.5gm of sodium, & 5 or more servings of fruits and vegetables daily;Understanding recommendations for meals to include 15-35% energy as protein, 25-35% energy from fat, 35-60% energy from carbohydrates, less than 200mg  of dietary cholesterol, 20-35 gm of total fiber daily;Weight Loss: Understanding of general recommendations for a balanced deficit meal plan, which promotes 1-2 lb weight loss per week and includes a negative energy balance of [phone] kcal/d;Understanding of distribution of calorie intake throughout the  day with the consumption of 4-5 meals/snacks    Heart Failure Yes    Intervention Provide a combined exercise and nutrition program that is supplemented with education, support and counseling about heart failure. Directed toward relieving symptoms such as shortness of breath, decreased exercise tolerance, and extremity edema.    Expected Outcomes Improve functional capacity of life;Short term: Attendance in program 2-3 days a week with increased exercise capacity. Reported lower sodium intake. Reported increased fruit and vegetable intake. Reports medication compliance.;Short term: Daily weights obtained and reported for increase. Utilizing diuretic protocols set by physician.;Long term: Adoption of self-care skills and reduction of barriers for early signs and symptoms recognition and intervention leading to self-care maintenance.    Hypertension Yes    Intervention Provide education on lifestyle modifcations including regular physical activity/exercise, weight management, moderate sodium restriction and increased consumption of fresh fruit, vegetables, and low fat dairy, alcohol moderation, and smoking cessation.;Monitor prescription use compliance.    Expected Outcomes Short Term: Continued assessment and intervention until BP is < 140/70mm HG in hypertensive participants. < 130/65mm HG in hypertensive participants with diabetes, heart failure or chronic kidney disease.;Long Term: Maintenance of blood pressure at goal levels.  Lipids Yes    Intervention Provide education and support for participant on nutrition & aerobic/resistive exercise along with prescribed medications to achieve LDL 70mg , HDL >40mg .    Expected Outcomes Short Term: Participant states understanding of desired cholesterol values and is compliant with medications prescribed. Participant is following exercise prescription and nutrition guidelines.;Long Term: Cholesterol controlled with medications as prescribed, with individualized  exercise RX and with personalized nutrition plan. Value goals: LDL < 70mg , HDL > 40 mg.           Education:Diabetes - Individual verbal and written instruction to review signs/symptoms of diabetes, desired ranges of glucose level fasting, after meals and with exercise. Acknowledge that pre and post exercise glucose checks will be done for 3 sessions at entry of program.   Core Components/Risk Factors/Patient Goals Review:    Core Components/Risk Factors/Patient Goals at Discharge (Final Review):    ITP Comments:  ITP Comments    Row Name 03/17/20 1421 03/24/20 1409         ITP Comments Initial telelphone orientation completed. Diagnosis can be found in [facility] 2/8. EP orientation scheduled for Monday 3/21 at 10am. Completed 6MWT and gym orientation. Initial ITP created and sent for review to Dr. Emily Filbert, Medical Director.             Comments: Initial ITP

## 2020-03-26 ENCOUNTER — Encounter: Payer: Self-pay | Admitting: *Deleted

## 2020-03-26 DIAGNOSIS — Z955 Presence of coronary angioplasty implant and graft: Secondary | ICD-10-CM

## 2020-03-26 DIAGNOSIS — I213 ST elevation (STEMI) myocardial infarction of unspecified site: Secondary | ICD-10-CM

## 2020-03-26 NOTE — Progress Notes (Signed)
Cardiac Individual Treatment Plan  Patient Details  Name: Kayla Maxwell MRN: [id number] Date of Birth: September 12, 1945 Referring Provider:   Flowsheet Row Cardiac Rehab from 03/24/2020 in [facility] Cardiac and Pulmonary Rehab  Referring Provider Serafina Royals  MD      Initial Encounter Date:  Flowsheet Row Cardiac Rehab from 03/24/2020 in [facility] Cardiac and Pulmonary Rehab  Date 03/24/20      Visit Diagnosis: ST elevation myocardial infarction (STEMI), unspecified artery [facility])  Status post coronary artery stent placement  Patient's Home Medications on Admission:  Current Outpatient Medications:  .  albuterol (PROVENTIL HFA;VENTOLIN HFA) 108 (90 Base) MCG/ACT inhaler, Inhale 2 puffs into the lungs every 6 (six) hours as needed for wheezing or shortness of breath., Disp: 1 Inhaler, Rfl: 5 .  aspirin EC 81 MG EC tablet, Take 1 tablet (81 mg total) by mouth daily., Disp: 30 tablet, Rfl: 1 .  dapagliflozin propanediol (FARXIGA) 5 MG TABS tablet, Take 1 tablet (5 mg total) by mouth daily., Disp: 30 tablet, Rfl: 2 .  FLUoxetine (PROZAC) 20 MG capsule, TAKE 1 CAPSULE BY MOUTH EVERY DAY, Disp: 90 capsule, Rfl: 1 .  furosemide (LASIX) 20 MG tablet, Take 1 tablet (20 mg total) by mouth daily as needed for fluid or edema., Disp: 30 tablet, Rfl: 2 .  gabapentin (NEURONTIN) 300 MG capsule, TAKE 1 CAPSULE BY MOUTH EVERYDAY AT BEDTIME (Patient taking differently: Take 300 mg by mouth at bedtime.), Disp: 30 capsule, Rfl: 3 .  HYDROcodone-acetaminophen (NORCO) 5-325 MG tablet, Take 1-2 tablets by mouth every 8 (eight) hours as needed for moderate pain or severe pain., Disp: 90 tablet, Rfl: 0 .  lisinopril (ZESTRIL) 2.5 MG tablet, TAKE 1 TABLET BY MOUTH EVERY DAY, Disp: 90 tablet, Rfl: 1 .  metoprolol tartrate (LOPRESSOR) 25 MG tablet, TAKE 2 TABLETS BY MOUTH TWICE A DAY, Disp: 360 tablet, Rfl: 1 .  midodrine (PROAMATINE) 2.5 MG tablet, Take 1 tablet (2.5 mg total) by mouth 3 (three) times daily with meals., Disp:  90 tablet, Rfl: 1 .  nitroGLYCERIN (NITROSTAT) 0.4 MG SL tablet, Place 1 tablet (0.4 mg total) under the tongue every 5 (five) minutes x 3 doses as needed for chest pain., Disp: 30 tablet, Rfl: 12 .  omeprazole (PRILOSEC) 40 MG capsule, TAKE 1 CAPSULE BY MOUTH EVERY DAY, Disp: 90 capsule, Rfl: 1 .  ondansetron (ZOFRAN ODT) 4 MG disintegrating tablet, Take 1 tablet (4 mg total) by mouth every 8 (eight) hours as needed., Disp: 20 tablet, Rfl: 0 .  rivaroxaban (XARELTO) 20 MG TABS tablet, Take 20 mg by mouth daily with supper. , Disp: , Rfl:  .  rosuvastatin (CRESTOR) 20 MG tablet, Take 1 tablet (20 mg total) by mouth daily., Disp: 90 tablet, Rfl: 1 .  ticagrelor (BRILINTA) 90 MG TABS tablet, Take 1 tablet (90 mg total) by mouth 2 (two) times daily., Disp: 60 tablet, Rfl: 11  Past Medical History: Past Medical History:  Diagnosis Date  . Acid reflux    takes Nexium daily  . Allergy    takes Singulair daily as needed  . Anemia   . Atrial fibrillation ([place])   . Cancer ([place]) AGE 2   CERVICAL cancer with removal  . CHF (congestive heart failure) ([place])   . Chronic back pain    stenosis  . Complication of anesthesia   . Constipation    takes Colace daily as needed  . Coronary artery disease   . Difficult intubation   . Dysrhythmia  atrial fibrillation dx 04/2014  . Headache    sinus   . History of blood transfusion   . History of shingles   . Hyperlipidemia    takes Pravastatin daily-started taking this 6 months ago  . Hypertension   . Lumbar surgical wound fluid collection   . Sleep apnea    CPAP  . Stroke ([place])    TIA'S X 2 --- one last yr and one this yr...lost her memory over 4-6 hrs.  . Stroke ([place]) 06-04-15   left with right side weakness  . TIA (transient ischemic attack)    showed up on an MRI but she never knew it  . Urinary frequency     Tobacco Use: Social History   Tobacco Use  Smoking Status Former Smoker  . Packs/day: 1.00  . Years: 47.00  . Pack years:  47.00  . Quit date: 08/19/2014  . Years since quitting: 5.6  Smokeless Tobacco Former Systems developer  . Quit date: 08/19/2014    Labs: Recent Review Flowsheet Data    Labs for ITP Cardiac and Pulmonary Rehab Latest Ref Rng & Units 04/11/2015 06/06/2015 06/30/2015 04/04/2017 02/13/2020   Cholestrol 0 - 200 mg/dL - - 138 134 106   LDLCALC 0 - 99 mg/dL - - 47 50 53   HDL >40 mg/dL - - 55 51 36(L)   Trlycerides <150 mg/dL - - 180(H) 165(H) 87   Hemoglobin A1c 4.8 - 5.6 % 5.8 5.8 - 6.0(H) -       Exercise Target Goals: Exercise Program Goal: Individual exercise prescription set using results from initial 6 min walk test and THRR while considering  patient's activity barriers and safety.   Exercise Prescription Goal: Initial exercise prescription builds to 30-45 minutes a day of aerobic activity, 2-3 days per week.  Home exercise guidelines will be given to patient during program as part of exercise prescription that the participant will acknowledge.   Education: Aerobic Exercise: - Group verbal and visual presentation on the components of exercise prescription. Introduces F.I.T.T principle from ACSM for exercise prescriptions.  Reviews F.I.T.T. principles of aerobic exercise including progression. Written material given at graduation. Flowsheet Row Cardiac Rehab from 03/24/2020 in [facility] Cardiac and Pulmonary Rehab  Education need identified 03/24/20      Education: Resistance Exercise: - Group verbal and visual presentation on the components of exercise prescription. Introduces F.I.T.T principle from ACSM for exercise prescriptions  Reviews F.I.T.T. principles of resistance exercise including progression. Written material given at graduation.    Education: Exercise & Equipment Safety: - Individual verbal instruction and demonstration of equipment use and safety with use of the equipment. Flowsheet Row Cardiac Rehab from 03/24/2020 in [facility] Cardiac and Pulmonary Rehab  Date 03/24/20  Educator [facility]   Instruction Review Code 1- Verbalizes Understanding      Education: Exercise Physiology & General Exercise Guidelines: - Group verbal and written instruction with models to review the exercise physiology of the cardiovascular system and associated critical values. Provides general exercise guidelines with specific guidelines to those with heart or lung disease.    Education: Flexibility, Balance, Mind/Body Relaxation: - Group verbal and visual presentation with interactive activity on the components of exercise prescription. Introduces F.I.T.T principle from ACSM for exercise prescriptions. Reviews F.I.T.T. principles of flexibility and balance exercise training including progression. Also discusses the mind body connection.  Reviews various relaxation techniques to help reduce and manage stress (i.e. Deep breathing, progressive muscle relaxation, and visualization). Balance handout provided to take home. Written material  given at graduation.   Activity Barriers & Risk Stratification:  Activity Barriers & Cardiac Risk Stratification - 03/24/20 1410      Activity Barriers & Cardiac Risk Stratification   Activity Barriers Back Problems;Other (comment);Deconditioning;Muscular Weakness;Shortness of Breath;Balance Concerns    Comments hx of stent in groin, nerve pain, chronic back pain, and pain in feet    Cardiac Risk Stratification High           6 Minute Walk:  6 Minute Walk    Row Name 03/24/20 1409         6 Minute Walk   Phase Initial     Distance 400 feet     Walk Time 4.02 minutes     # of Rest Breaks 5  5 sec, 7 sec, 7 sec, 1:10, 8 sec, stopped at 5:42     MPH 1.13     METS 1.09     RPE 17     Perceived Dyspnea  3     VO2 Peak 3.84     Symptoms Yes (comment)     Comments SOB, chronic back pain 6/10     Resting HR 79 bpm     Resting BP 134/64     Resting Oxygen Saturation  93 %     Exercise Oxygen Saturation  during 6 min walk 91 %     Max Ex. HR 100 bpm     Max  Ex. BP 148/82     2 Minute Post BP 122/66            Oxygen Initial Assessment:   Oxygen Re-Evaluation:   Oxygen Discharge (Final Oxygen Re-Evaluation):   Initial Exercise Prescription:  Initial Exercise Prescription - 03/24/20 1400      Date of Initial Exercise RX and Referring Provider   Date 03/24/20    Referring Provider Serafina Royals  MD      Treadmill   MPH 0.8    Grade 0    Minutes 5    METs 1.6      Recumbant Bike   Level 1    RPM 50    Watts 10    Minutes 15    METs 1.5      NuStep   Level 1    SPM 80    Minutes 15    METs 1.5      REL-XR   Level 1    Speed 50    Minutes 15    METs 1.5      Prescription Details   Frequency (times per week) 2    Duration Progress to 30 minutes of continuous aerobic without signs/symptoms of physical distress      Intensity   THRR 40-80% of Max Heartrate 106-133    Ratings of Perceived Exertion 11-13    Perceived Dyspnea 0-4      Progression   Progression Continue to progress workloads to maintain intensity without signs/symptoms of physical distress.      Resistance Training   Training Prescription Yes    Weight 3 lb    Reps 10-15           Perform Capillary Blood Glucose checks as needed.  Exercise Prescription Changes:  Exercise Prescription Changes    Row Name 03/24/20 1400             Response to Exercise   Blood Pressure (Admit) 134/64       Blood Pressure (Exercise) 148/82       Blood  Pressure (Exit) 122/66       Heart Rate (Admit) 79 bpm       Heart Rate (Exercise) 100 bpm       Heart Rate (Exit) 85 bpm       Oxygen Saturation (Admit) 93 %       Oxygen Saturation (Exercise) 91 %       Rating of Perceived Exertion (Exercise) 17       Perceived Dyspnea (Exercise) 3       Symptoms SOB, chronic back pain 6/10       Comments walk test results              Exercise Comments:   Exercise Goals and Review:  Exercise Goals    Row Name 03/24/20 1415             Exercise  Goals   Increase Physical Activity Yes       Intervention Provide advice, education, support and counseling about physical activity/exercise needs.;Develop an individualized exercise prescription for aerobic and resistive training based on initial evaluation findings, risk stratification, comorbidities and participant's personal goals.       Expected Outcomes Short Term: Attend rehab on a regular basis to increase amount of physical activity.;Long Term: Exercising regularly at least 3-5 days a week.;Long Term: Add in home exercise to make exercise part of routine and to increase amount of physical activity.       Increase Strength and Stamina Yes       Intervention Provide advice, education, support and counseling about physical activity/exercise needs.;Develop an individualized exercise prescription for aerobic and resistive training based on initial evaluation findings, risk stratification, comorbidities and participant's personal goals.       Expected Outcomes Short Term: Increase workloads from initial exercise prescription for resistance, speed, and METs.;Short Term: Perform resistance training exercises routinely during rehab and add in resistance training at home;Long Term: Improve cardiorespiratory fitness, muscular endurance and strength as measured by increased METs and functional capacity (6MWT)       Able to understand and use rate of perceived exertion (RPE) scale Yes       Intervention Provide education and explanation on how to use RPE scale       Expected Outcomes Short Term: Able to use RPE daily in rehab to express subjective intensity level;Long Term:  Able to use RPE to guide intensity level when exercising independently       Able to understand and use Dyspnea scale Yes       Intervention Provide education and explanation on how to use Dyspnea scale       Expected Outcomes Short Term: Able to use Dyspnea scale daily in rehab to express subjective sense of shortness of breath during  exertion;Long Term: Able to use Dyspnea scale to guide intensity level when exercising independently       Knowledge and understanding of Target Heart Rate Range (THRR) Yes       Intervention Provide education and explanation of THRR including how the numbers were predicted and where they are located for reference       Expected Outcomes Short Term: Able to state/look up THRR;Short Term: Able to use daily as guideline for intensity in rehab;Long Term: Able to use THRR to govern intensity when exercising independently       Able to check pulse independently Yes       Intervention Provide education and demonstration on how to check pulse in carotid and radial arteries.;Review the importance  of being able to check your own pulse for safety during independent exercise       Expected Outcomes Short Term: Able to explain why pulse checking is important during independent exercise;Long Term: Able to check pulse independently and accurately       Understanding of Exercise Prescription Yes       Intervention Provide education, explanation, and written materials on patient's individual exercise prescription       Expected Outcomes Short Term: Able to explain program exercise prescription;Long Term: Able to explain home exercise prescription to exercise independently              Exercise Goals Re-Evaluation :   Discharge Exercise Prescription (Final Exercise Prescription Changes):  Exercise Prescription Changes - 03/24/20 1400      Response to Exercise   Blood Pressure (Admit) 134/64    Blood Pressure (Exercise) 148/82    Blood Pressure (Exit) 122/66    Heart Rate (Admit) 79 bpm    Heart Rate (Exercise) 100 bpm    Heart Rate (Exit) 85 bpm    Oxygen Saturation (Admit) 93 %    Oxygen Saturation (Exercise) 91 %    Rating of Perceived Exertion (Exercise) 17    Perceived Dyspnea (Exercise) 3    Symptoms SOB, chronic back pain 6/10    Comments walk test results           Nutrition:  Target  Goals: Understanding of nutrition guidelines, daily intake of sodium 1500mg , cholesterol 200mg , calories 30% from fat and 7% or less from saturated fats, daily to have 5 or more servings of fruits and vegetables.  Education: All About Nutrition: -Group instruction provided by verbal, written material, interactive activities, discussions, models, and posters to present general guidelines for heart healthy nutrition including fat, fiber, MyPlate, the role of sodium in heart healthy nutrition, utilization of the nutrition label, and utilization of this knowledge for meal planning. Follow up email sent as well. Written material given at graduation. Flowsheet Row Cardiac Rehab from 03/24/2020 in [facility] Cardiac and Pulmonary Rehab  Education need identified 03/24/20      Biometrics:  Pre Biometrics - 03/24/20 1416      Pre Biometrics   Height 5' 3.9" (1.623 m)    Weight 168 lb 11.2 oz (76.5 kg)    BMI (Calculated) 29.05    Single Leg Stand 3.2 seconds            Nutrition Therapy Plan and Nutrition Goals:   Nutrition Assessments:  MEDIFICTS Score Key:  ?70 Need to make dietary changes   40-70 Heart Healthy Diet  ? 40 Therapeutic Level Cholesterol Diet  Flowsheet Row Cardiac Rehab from 03/24/2020 in [facility] Cardiac and Pulmonary Rehab  Picture Your Plate Total Score on Admission 74     Picture Your Plate Scores:  <54 Unhealthy dietary pattern with much room for improvement.  41-50 Dietary pattern unlikely to meet recommendations for good health and room for improvement.  51-60 More healthful dietary pattern, with some room for improvement.   >60 Healthy dietary pattern, although there may be some specific behaviors that could be improved.    Nutrition Goals Re-Evaluation:   Nutrition Goals Discharge (Final Nutrition Goals Re-Evaluation):   Psychosocial: Target Goals: Acknowledge presence or absence of significant depression and/or stress, maximize coping skills, provide  positive support system. Participant is able to verbalize types and ability to use techniques and skills needed for reducing stress and depression.   Education: Stress, Anxiety, and Depression - Group  verbal and visual presentation to define topics covered.  Reviews how body is impacted by stress, anxiety, and depression.  Also discusses healthy ways to reduce stress and to treat/manage anxiety and depression.  Written material given at graduation.   Education: Sleep Hygiene -Provides group verbal and written instruction about how sleep can affect your health.  Define sleep hygiene, discuss sleep cycles and impact of sleep habits. Review good sleep hygiene tips.    Initial Review & Psychosocial Screening:  Initial Psych Review & Screening - 03/17/20 1413      Initial Review   Current issues with Current Anxiety/Panic;Current Stress Concerns    Source of Stress Concerns Chronic Illness      Family Dynamics   Good Support System? Yes   sister     Barriers   Psychosocial barriers to participate in program There are no identifiable barriers or psychosocial needs.;The patient should benefit from training in stress management and relaxation.      Screening Interventions   Interventions To provide support and resources with identified psychosocial needs;Encouraged to exercise;Provide feedback about the scores to participant    Expected Outcomes Short Term goal: Utilizing psychosocial counselor, staff and physician to assist with identification of specific Stressors or current issues interfering with healing process. Setting desired goal for each stressor or current issue identified.;Long Term Goal: Stressors or current issues are controlled or eliminated.;Short Term goal: Identification and review with participant of any Quality of Life or Depression concerns found by scoring the questionnaire.;Long Term goal: The participant improves quality of Life and PHQ9 Scores as seen by post scores and/or  verbalization of changes           Quality of Life Scores:   Quality of Life - 03/24/20 1416      Quality of Life   Select Quality of Life      Quality of Life Scores   Health/Function Pre 9.97 %    Socioeconomic Pre 30 %    Psych/Spiritual Pre 15.43 %    Family Pre 27.6 %    GLOBAL Pre 17.81 %          Scores of 19 and below usually indicate a poorer quality of life in these areas.  A difference of  2-3 points is a clinically meaningful difference.  A difference of 2-3 points in the total score of the Quality of Life Index has been associated with significant improvement in overall quality of life, self-image, physical symptoms, and general health in studies assessing change in quality of life.  PHQ-9: Recent Review Flowsheet Data    Depression screen [facility] 2/9 03/24/2020 02/18/2020 08/23/2019 08/22/2018 03/09/2017   Decreased Interest 1 0 0 0 0   Down, Depressed, Hopeless 1 0 0 0 1   PHQ - 2 Score 2 0 0 0 1   Altered sleeping 0 1 - - -   Tired, decreased energy 3 3 - - -   Change in appetite 2 2 - - -   Feeling bad or failure about yourself  1 0 - - -   Trouble concentrating 0 0 - - -   Moving slowly or fidgety/restless 0 0 - - -   Suicidal thoughts 0 0 - - -   PHQ-9 Score 8 6 - - -   Difficult doing work/chores Somewhat difficult Not difficult at all - - -     Interpretation of Total Score  Total Score Depression Severity:  1-4 = Minimal depression, 5-9 = Mild depression,  10-14 = Moderate depression, 15-19 = Moderately severe depression, 20-27 = Severe depression   Psychosocial Evaluation and Intervention:  Psychosocial Evaluation - 03/17/20 1430      Psychosocial Evaluation & Interventions   Interventions Stress management education;Relaxation education;Encouraged to exercise with the program and follow exercise prescription    Comments Ms. Dufner reports doing okay post MI. She Lorek is having issues sleeping, some of it related to anxiety from her CPAP mask. She doesn't  report any pain, but increased weakness. Her sister is her main support system and helps her daily. She is looking forward to coming to Cardiac Rehab for education and exercise to boost her stamina and strength. She does struggle with a decreased appetite which is new post MI.    Expected Outcomes Short: attend cardiac rehab for education and exercise. Long: develop and maintain positive self care    Continue Psychosocial Services  Follow up required by staff           Psychosocial Re-Evaluation:   Psychosocial Discharge (Final Psychosocial Re-Evaluation):   Vocational Rehabilitation: Provide vocational rehab assistance to qualifying candidates.   Vocational Rehab Evaluation & Intervention:  Vocational Rehab - 03/17/20 1413      Initial Vocational Rehab Evaluation & Intervention   Assessment shows need for Vocational Rehabilitation No           Education: Education Goals: Education classes will be provided on a variety of topics geared toward better understanding of heart health and risk factor modification. Participant will state understanding/return demonstration of topics presented as noted by education test scores.  Learning Barriers/Preferences:  Learning Barriers/Preferences - 03/17/20 1413      Learning Barriers/Preferences   Learning Barriers None    Learning Preferences None           General Cardiac Education Topics:  AED/CPR: - Group verbal and written instruction with the use of models to demonstrate the basic use of the AED with the basic ABC's of resuscitation.   Anatomy and Cardiac Procedures: - Group verbal and visual presentation and models provide information about basic cardiac anatomy and function. Reviews the testing methods done to diagnose heart disease and the outcomes of the test results. Describes the treatment choices: Medical Management, Angioplasty, or Coronary Bypass Surgery for treating various heart conditions including Myocardial  Infarction, Angina, Valve Disease, and Cardiac Arrhythmias.  Written material given at graduation. Flowsheet Row Cardiac Rehab from 03/24/2020 in [facility] Cardiac and Pulmonary Rehab  Education need identified 03/24/20      Medication Safety: - Group verbal and visual instruction to review commonly prescribed medications for heart and lung disease. Reviews the medication, class of the drug, and side effects. Includes the steps to properly store meds and maintain the prescription regimen.  Written material given at graduation.   Intimacy: - Group verbal instruction through game format to discuss how heart and lung disease can affect sexual intimacy. Written material given at graduation..   Know Your Numbers and Heart Failure: - Group verbal and visual instruction to discuss disease risk factors for cardiac and pulmonary disease and treatment options.  Reviews associated critical values for Overweight/Obesity, Hypertension, Cholesterol, and Diabetes.  Discusses basics of heart failure: signs/symptoms and treatments.  Introduces Heart Failure Zone chart for action plan for heart failure.  Written material given at graduation.   Infection Prevention: - Provides verbal and written material to individual with discussion of infection control including proper hand washing and proper equipment cleaning during exercise session. [place] [facility]  from 03/24/2020 in [facility] Cardiac and Pulmonary Rehab  Date 03/24/20  Educator [facility]  Instruction Review Code 1- Verbalizes Understanding      Falls Prevention: - Provides verbal and written material to individual with discussion of falls prevention and safety. Flowsheet Row Cardiac Rehab from 03/24/2020 in [facility] Cardiac and Pulmonary Rehab  Date 03/24/20  Educator [facility]  Instruction Review Code 1- Verbalizes Understanding      Other: -Provides group and verbal instruction on various topics (see comments)   Knowledge Questionnaire Score:  Knowledge  Questionnaire Score - 03/24/20 1419      Knowledge Questionnaire Score   Pre Score 23/26 Education Focus: angina, nurtition, exercise           Core Components/Risk Factors/Patient Goals at Admission:  Personal Goals and Risk Factors at Admission - 03/24/20 1420      Core Components/Risk Factors/Patient Goals on Admission    Weight Management Yes;Weight Loss;Weight Maintenance    Intervention Weight Management: Develop a combined nutrition and exercise program designed to reach desired caloric intake, while maintaining appropriate intake of nutrient and fiber, sodium and fats, and appropriate energy expenditure required for the weight goal.;Weight Management: Provide education and appropriate resources to help participant work on and attain dietary goals.;Weight Management/Obesity: Establish reasonable short term and long term weight goals.    Admit Weight 168 lb 11.2 oz (76.5 kg)    Goal Weight: Short Term 165 lb (74.8 kg)    Goal Weight: Long Term 165 lb (74.8 kg)    Expected Outcomes Long Term: Adherence to nutrition and physical activity/exercise program aimed toward attainment of established weight goal;Short Term: Continue to assess and modify interventions until short term weight is achieved;Weight Maintenance: Understanding of the daily nutrition guidelines, which includes 25-35% calories from fat, 7% or less cal from saturated fats, less than 200mg  cholesterol, less than 1.5gm of sodium, & 5 or more servings of fruits and vegetables daily;Understanding recommendations for meals to include 15-35% energy as protein, 25-35% energy from fat, 35-60% energy from carbohydrates, less than 200mg  of dietary cholesterol, 20-35 gm of total fiber daily;Weight Loss: Understanding of general recommendations for a balanced deficit meal plan, which promotes 1-2 lb weight loss per week and includes a negative energy balance of [phone] kcal/d;Understanding of distribution of calorie intake throughout the  day with the consumption of 4-5 meals/snacks    Heart Failure Yes    Intervention Provide a combined exercise and nutrition program that is supplemented with education, support and counseling about heart failure. Directed toward relieving symptoms such as shortness of breath, decreased exercise tolerance, and extremity edema.    Expected Outcomes Improve functional capacity of life;Short term: Attendance in program 2-3 days a week with increased exercise capacity. Reported lower sodium intake. Reported increased fruit and vegetable intake. Reports medication compliance.;Short term: Daily weights obtained and reported for increase. Utilizing diuretic protocols set by physician.;Long term: Adoption of self-care skills and reduction of barriers for early signs and symptoms recognition and intervention leading to self-care maintenance.    Hypertension Yes    Intervention Provide education on lifestyle modifcations including regular physical activity/exercise, weight management, moderate sodium restriction and increased consumption of fresh fruit, vegetables, and low fat dairy, alcohol moderation, and smoking cessation.;Monitor prescription use compliance.    Expected Outcomes Short Term: Continued assessment and intervention until BP is < 140/70mm HG in hypertensive participants. < 130/65mm HG in hypertensive participants with diabetes, heart failure or chronic kidney disease.;Long Term: Maintenance of blood pressure at goal levels.  Lipids Yes    Intervention Provide education and support for participant on nutrition & aerobic/resistive exercise along with prescribed medications to achieve LDL 70mg , HDL >40mg .    Expected Outcomes Short Term: Participant states understanding of desired cholesterol values and is compliant with medications prescribed. Participant is following exercise prescription and nutrition guidelines.;Long Term: Cholesterol controlled with medications as prescribed, with individualized  exercise RX and with personalized nutrition plan. Value goals: LDL < 70mg , HDL > 40 mg.           Education:Diabetes - Individual verbal and written instruction to review signs/symptoms of diabetes, desired ranges of glucose level fasting, after meals and with exercise. Acknowledge that pre and post exercise glucose checks will be done for 3 sessions at entry of program.   Core Components/Risk Factors/Patient Goals Review:    Core Components/Risk Factors/Patient Goals at Discharge (Final Review):    ITP Comments:  ITP Comments    Row Name 03/17/20 1421 03/24/20 1409 03/26/20 1124       ITP Comments Initial telelphone orientation completed. Diagnosis can be found in [facility] 2/8. EP orientation scheduled for Monday 3/21 at 10am. Completed 6MWT and gym orientation. Initial ITP created and sent for review to Dr. Emily Filbert, Medical Director. 30 Day review completed. Medical Director ITP review done, changes made as directed, and signed approval by Medical Director.  New to program            Comments:

## 2020-03-27 ENCOUNTER — Ambulatory Visit: Payer: Medicare Other | Attending: Family | Admitting: Family

## 2020-03-27 ENCOUNTER — Other Ambulatory Visit: Payer: Self-pay

## 2020-03-27 ENCOUNTER — Encounter: Payer: Self-pay | Admitting: Family

## 2020-03-27 VITALS — BP 97/56 | HR 68 | Resp 18 | Ht 65.0 in | Wt 166.0 lb

## 2020-03-27 DIAGNOSIS — Z882 Allergy status to sulfonamides status: Secondary | ICD-10-CM | POA: Insufficient documentation

## 2020-03-27 DIAGNOSIS — Z7982 Long term (current) use of aspirin: Secondary | ICD-10-CM | POA: Insufficient documentation

## 2020-03-27 DIAGNOSIS — Z880 Allergy status to penicillin: Secondary | ICD-10-CM | POA: Diagnosis not present

## 2020-03-27 DIAGNOSIS — Z8249 Family history of ischemic heart disease and other diseases of the circulatory system: Secondary | ICD-10-CM | POA: Diagnosis not present

## 2020-03-27 DIAGNOSIS — Z87891 Personal history of nicotine dependence: Secondary | ICD-10-CM | POA: Diagnosis not present

## 2020-03-27 DIAGNOSIS — E785 Hyperlipidemia, unspecified: Secondary | ICD-10-CM | POA: Insufficient documentation

## 2020-03-27 DIAGNOSIS — Z885 Allergy status to narcotic agent status: Secondary | ICD-10-CM | POA: Diagnosis not present

## 2020-03-27 DIAGNOSIS — I110 Hypertensive heart disease with heart failure: Secondary | ICD-10-CM | POA: Insufficient documentation

## 2020-03-27 DIAGNOSIS — M79671 Pain in right foot: Secondary | ICD-10-CM | POA: Insufficient documentation

## 2020-03-27 DIAGNOSIS — K219 Gastro-esophageal reflux disease without esophagitis: Secondary | ICD-10-CM | POA: Diagnosis present

## 2020-03-27 DIAGNOSIS — Z7901 Long term (current) use of anticoagulants: Secondary | ICD-10-CM | POA: Insufficient documentation

## 2020-03-27 DIAGNOSIS — I10 Essential (primary) hypertension: Secondary | ICD-10-CM

## 2020-03-27 DIAGNOSIS — I2510 Atherosclerotic heart disease of native coronary artery without angina pectoris: Secondary | ICD-10-CM | POA: Diagnosis not present

## 2020-03-27 DIAGNOSIS — Z8673 Personal history of transient ischemic attack (TIA), and cerebral infarction without residual deficits: Secondary | ICD-10-CM | POA: Insufficient documentation

## 2020-03-27 DIAGNOSIS — Z955 Presence of coronary angioplasty implant and graft: Secondary | ICD-10-CM | POA: Diagnosis not present

## 2020-03-27 DIAGNOSIS — Z79899 Other long term (current) drug therapy: Secondary | ICD-10-CM | POA: Diagnosis not present

## 2020-03-27 DIAGNOSIS — I2102 ST elevation (STEMI) myocardial infarction involving left anterior descending coronary artery: Secondary | ICD-10-CM

## 2020-03-27 DIAGNOSIS — M79672 Pain in left foot: Secondary | ICD-10-CM | POA: Insufficient documentation

## 2020-03-27 DIAGNOSIS — I5022 Chronic systolic (congestive) heart failure: Secondary | ICD-10-CM

## 2020-03-27 NOTE — Progress Notes (Signed)
Patient ID: Kayla Maxwell, female    DOB: Dec 30, 1945, 75 y.o.   MRN: [id number]  HPI  Kayla Maxwell is a 75 y/o female with a history of CAD, hyperlipidemia, HTN, stroke, TIA, sleep apnea, anemia, atrial fibrillation, previous tobacco use and chronic heart failure.   Echo report from 02/12/20 reviewed and showed an EF of 30-35%  LHC done 02/13/20 showed:  Mid LAD lesion is 99% stenosed.  Post intervention, there is a 0% residual stenosis.  A stent was successfully placed.  Minimal disease in circumflex  Minimal disease in RCA  Successful PCI and stent of mid LAD   Conclusion Severely depressed left ventricular function anterior apical septal akinesis EF less than 25% Successful cardiac cath Radial approach Large left main free of disease LAD large with 100% mid lesion thrombotic ulcerated Circumflex medium to large minor irregularity RCA medium free of disease  Admitted 02/12/20 due to chest pain with subsequent weakness. ST elevation noted but symptoms had been going on for many days prior to presentation. Cardiology consult obtained. Cath done with stent placed to mid LAD. Lifevest placed. Referral made to cardiac rehab. Discharged after 3 days.   She presents today for a follow-up visit with a chief complaint of minimal shortness of breath upon moderate exertion. She describes this as chronic in nature having been present for several months. She has associated fatigue, cough and bilateral feet pain along with this. She denies any difficulty sleeping, dizziness, abdominal distention, palpitations, pedal edema, chest pain or weight gain.   Denies having any shocks delivered with her lifevest. Currently participating in cardiac rehab.   Past Medical History:  Diagnosis Date  . Acid reflux    takes Nexium daily  . Allergy    takes Singulair daily as needed  . Anemia   . Atrial fibrillation ([place])   . Cancer ([place]) AGE 44   CERVICAL cancer with removal  . CHF (congestive heart  failure) ([place])   . Chronic back pain    stenosis  . Complication of anesthesia   . Constipation    takes Colace daily as needed  . Coronary artery disease   . Difficult intubation   . Dysrhythmia    atrial fibrillation dx 04/2014  . Headache    sinus   . History of blood transfusion   . History of shingles   . Hyperlipidemia    takes Pravastatin daily-started taking this 6 months ago  . Hypertension   . Lumbar surgical wound fluid collection   . Sleep apnea    CPAP  . Stroke ([place])    TIA'S X 2 --- one last yr and one this yr...lost her memory over 4-6 hrs.  . Stroke ([place]) 06-04-15   left with right side weakness  . TIA (transient ischemic attack)    showed up on an MRI but she never knew it  . Urinary frequency    Past Surgical History:  Procedure Laterality Date  . ABDOMINAL HYSTERECTOMY    . APPENDECTOMY  1963  . BACK SURGERY  08/19/2014   Dr. Hal Neer ([place]  . BREAST SURGERY  1984   augementation  . [place]  . CHOLECYSTECTOMY  2014   Dr Jamal Collin  . COLONOSCOPY    . CORONARY STENT INTERVENTION N/A 02/13/2020   Procedure: CORONARY STENT INTERVENTION;  Surgeon: Yolonda Kida, MD;  Location: [facility];  Service: Cardiovascular;  Laterality: N/A;  . ESOPHAGOGASTRODUODENOSCOPY    . HAMMER TOE SURGERY  Bilateral   . LEFT HEART CATH AND CORONARY ANGIOGRAPHY N/A 02/13/2020   Procedure: LEFT HEART CATH AND CORONARY ANGIOGRAPHY and possible PCI and stent;  Surgeon: Yolonda Kida, MD;  Location: [facility];  Service: Cardiovascular;  Laterality: N/A;  . LOWER EXTREMITY ANGIOGRAPHY Left 01/23/2019   Procedure: LOWER EXTREMITY ANGIOGRAPHY;  Surgeon: Katha Cabal, MD;  Location: [facility];  Service: Cardiovascular;  Laterality: Left;  . LOWER EXTREMITY ANGIOGRAPHY Left 02/20/2019   Procedure: LOWER EXTREMITY ANGIOGRAPHY;  Surgeon: Katha Cabal, MD;  Location: [facility];  Service: Cardiovascular;   Laterality: Left;  . LUMBAR LAMINECTOMY WITH COFLEX 1 LEVEL Bilateral 07/04/2014   Procedure: Laminectomy and Foraminotomy - bilateral - Lumbar Four-Five with coflex ;  Surgeon: Karie Chimera, MD;  Location: [facility];  Service: Neurosurgery;  Laterality: Bilateral;  . LUMBAR WOUND DEBRIDEMENT N/A 08/19/2014   Procedure: Exploration of Lumbar wound;  Surgeon: Karie Chimera, MD;  Location: [facility];  Service: Neurosurgery;  Laterality: N/A;  . PERIPHERAL VASCULAR CATHETERIZATION Left 08/12/2015   Procedure: Lower Extremity Angiography;  Surgeon: Katha Cabal, MD;  Location: [facility];  Service: Cardiovascular;  Laterality: Left;  . TONSILLECTOMY    . VENTRAL HERNIA REPAIR N/A 05/26/2015   Procedure: HERNIA REPAIR VENTRAL ADULT;  Surgeon: Christene Lye, MD;  Location: [facility];  Service: General;  Laterality: N/A;   Family History  Problem Relation Age of Onset  . Alzheimer's disease Mother   . Heart attack Father   . Pancreatic cancer Sister   . Healthy Brother    Social History   Tobacco Use  . Smoking status: Former Smoker    Packs/day: 1.00    Years: 47.00    Pack years: 47.00    Quit date: 08/19/2014    Years since quitting: 5.6  . Smokeless tobacco: Former Systems developer    Quit date: 08/19/2014  Substance Use Topics  . Alcohol use: No   Allergies  Allergen Reactions  . Codeine Other (See Comments)    Makes her feel "crazy"  . Penicillins Swelling    Did it involve swelling of the face/tongue/throat, SOB, or low BP? Unknown Did it involve sudden or severe rash/hives, skin peeling, or any reaction on the inside of your mouth or nose? No Did you need to seek medical attention at a hospital or doctor's office? Unknown When did it last happen?Childhood allergy If all above answers are "NO", may proceed with cephalosporin use.   . Latex   . Adhesive [Tape] Hives    (Latex)  . Sulfa Antibiotics Nausea Only and Rash   Prior to Admission medications    Medication Sig Start Date End Date Taking? Authorizing Provider  albuterol (PROVENTIL HFA;VENTOLIN HFA) 108 (90 Base) MCG/ACT inhaler Inhale 2 puffs into the lungs every 6 (six) hours as needed for wheezing or shortness of breath. 06/09/16  Yes Mar Daring, PA-C  aspirin EC 81 MG EC tablet Take 1 tablet (81 mg total) by mouth daily. 06/08/15  Yes Henreitta Leber, MD  dapagliflozin propanediol (FARXIGA) 5 MG TABS tablet Take 1 tablet (5 mg total) by mouth daily. 02/15/20  Yes Lorella Nimrod, MD  FLUoxetine (PROZAC) 20 MG capsule TAKE 1 CAPSULE BY MOUTH EVERY DAY 02/29/20  Yes Mar Daring, PA-C  furosemide (LASIX) 20 MG tablet Take 1 tablet (20 mg total) by mouth daily as needed for fluid or edema. 02/15/20  Yes Lorella Nimrod, MD  gabapentin (  NEURONTIN) 300 MG capsule TAKE 1 CAPSULE BY MOUTH EVERYDAY AT BEDTIME Patient taking differently: Take 300 mg by mouth at bedtime. 01/31/20  Yes Kris Hartmann, NP  HYDROcodone-acetaminophen (NORCO) 5-325 MG tablet Take 1-2 tablets by mouth every 8 (eight) hours as needed for moderate pain or severe pain. 03/24/20  Yes Fenton Malling M, PA-C  lisinopril (ZESTRIL) 2.5 MG tablet TAKE 1 TABLET BY MOUTH EVERY DAY 02/29/20  Yes Mar Daring, PA-C  metoprolol tartrate (LOPRESSOR) 25 MG tablet TAKE 2 TABLETS BY MOUTH TWICE A DAY 03/13/20  Yes Fenton Malling M, PA-C  midodrine (PROAMATINE) 2.5 MG tablet Take 1 tablet (2.5 mg total) by mouth 3 (three) times daily with meals. 02/15/20  Yes Lorella Nimrod, MD  nitroGLYCERIN (NITROSTAT) 0.4 MG SL tablet Place 1 tablet (0.4 mg total) under the tongue every 5 (five) minutes x 3 doses as needed for chest pain. 02/15/20  Yes Lorella Nimrod, MD  omeprazole (PRILOSEC) 40 MG capsule TAKE 1 CAPSULE BY MOUTH EVERY DAY 03/03/20  Yes Mar Daring, PA-C  ondansetron (ZOFRAN ODT) 4 MG disintegrating tablet Take 1 tablet (4 mg total) by mouth every 8 (eight) hours as needed. 08/07/19  Yes Lavonia Drafts, MD   rivaroxaban (XARELTO) 20 MG TABS tablet Take 20 mg by mouth daily with supper.  08/27/14  Yes [provider]  rosuvastatin (CRESTOR) 20 MG tablet Take 1 tablet (20 mg total) by mouth daily. 02/15/20  Yes Lorella Nimrod, MD  ticagrelor (BRILINTA) 90 MG TABS tablet Take 1 tablet (90 mg total) by mouth 2 (two) times daily. 02/15/20  Yes Lorella Nimrod, MD    Review of Systems  Constitutional: Positive for fatigue. Negative for appetite change.  HENT: Negative for congestion, postnasal drip and sore throat.   Eyes: Negative.   Respiratory: Positive for cough (dry ) and shortness of breath.   Cardiovascular: Negative for chest pain, palpitations and leg swelling.  Gastrointestinal: Negative for abdominal distention and abdominal pain.  Endocrine: Negative.   Genitourinary: Negative.   Musculoskeletal: Positive for arthralgias (bilateral feet pain). Negative for back pain and neck pain.  Skin: Negative.   Allergic/Immunologic: Negative.   Neurological: Negative for dizziness and light-headedness.  Hematological: Negative for adenopathy. Bruises/bleeds easily.  Psychiatric/Behavioral: Negative for dysphoric mood and sleep disturbance (sleeping on 1 pillow with CPAP). The patient is not nervous/anxious.    Vitals:   03/27/20 1322  BP: (!) 97/56  Pulse: 68  Resp: 18  SpO2: 93%  Weight: 166 lb (75.3 kg)  Height: 5\' 5"  (1.651 m)   Wt Readings from Last 3 Encounters:  03/27/20 166 lb (75.3 kg)  03/24/20 168 lb 11.2 oz (76.5 kg)  02/29/20 172 lb (78 kg)   Lab Results  Component Value Date   CREATININE 1.16 (H) 02/22/2020   CREATININE 0.98 02/14/2020   CREATININE 1.14 (H) 02/13/2020    Physical Exam Vitals and nursing note reviewed. Exam conducted with a chaperone present (daughter ).  Constitutional:      Appearance: Normal appearance.  HENT:     Head: Normocephalic and atraumatic.  Cardiovascular:     Rate and Rhythm: Normal rate and regular rhythm.  Pulmonary:      Effort: Pulmonary effort is normal. No respiratory distress.     Breath sounds: No wheezing or rales.  Abdominal:     General: Abdomen is flat. There is no distension.     Palpations: Abdomen is soft.  Musculoskeletal:        General: No  tenderness.     Cervical back: Normal range of motion and neck supple.     Right lower leg: No edema.     Left lower leg: No edema.  Skin:    General: Skin is warm and dry.  Neurological:     General: No focal deficit present.     Mental Status: She is alert and oriented to person, place, and time.  Psychiatric:        Mood and Affect: Mood normal.        Behavior: Behavior normal.    Assessment & Plan:  1: Chronic heart failure with reduced ejection fraction- - NYHA class II - euvolemic today - weighing daily and says that her weight has been stable; reminded to call for an overnight weight gain of > 2 pounds or a weekly weight gain of > 5 pounds - weight down 6 pounds from last visit here 1 month ago - participating in cardiac rehab - not adding much salt to her food; reviewed the importance of reading food labels - current BP will not allow for transition from lisinopril to entresto - saw cardiology Nehemiah Massed) 03/20/20 - BP may not allow for spironolactone  - wearing lifevest and no shocks reported - BNP 02/12/20 was 794.8   2: HTN- - BP low but patient without dizziness - saw PCP Marlyn Corporal) 02/18/20 - BMP 02/22/20 reviewed and showed sodium 134, potassium 3.9, creatinine 1.16 and GFR 49  3: CAD- - stent placed in LAD - currently on both xarelto and brilinta   Patient did not bring her medications nor a list. Each medication was verbally reviewed with the patient and she was encouraged to bring the bottles to every visit to confirm accuracy of list.  Due to HF stability, will not make a return appointment for patient at this time. Advised patient to follow closely with cardiology and PCP and call us anytime for any questions or to make  another appointment. Patient was comfortable with this plan.

## 2020-03-27 NOTE — Patient Instructions (Addendum)
Continue weighing daily and call for an overnight weight gain of > 2 pounds or a weekly weight gain of >5 pounds.   Call us in the future if you'd like to schedule another appointment 

## 2020-04-02 ENCOUNTER — Encounter: Payer: Self-pay | Admitting: Physician Assistant

## 2020-04-07 ENCOUNTER — Other Ambulatory Visit: Payer: Self-pay

## 2020-04-07 ENCOUNTER — Encounter: Payer: Medicare Other | Attending: Internal Medicine

## 2020-04-07 DIAGNOSIS — I213 ST elevation (STEMI) myocardial infarction of unspecified site: Secondary | ICD-10-CM | POA: Insufficient documentation

## 2020-04-07 DIAGNOSIS — Z955 Presence of coronary angioplasty implant and graft: Secondary | ICD-10-CM | POA: Diagnosis not present

## 2020-04-07 NOTE — Progress Notes (Signed)
Daily Session Note  Patient Details  Name: Kayla Maxwell MRN: [id number] Date of Birth: 18-Jan-1945 Referring Provider:   Flowsheet Row Cardiac Rehab from 03/24/2020 in [facility] Cardiac and [facility]monary Re[facility]  Referring Provider Serafina Royals  MD      Encounter Date: 04/07/2020  Check In:  Session Check In - 04/07/20 1342      Check-In   Supervising physician immediately available to respond to emergencies See telemetry face sheet for immediately available ER MD    Location [facility]-Cardiac & Pulmonary Rehab    Staff Present Birdie Sons, MPA, Mauricia Area, BS, ACSM CEP, Exercise Physiologist;Kara Eliezer Bottom, MS Exercise Physiologist    Virtual Visit No    Medication changes reported     No    Fall or balance concerns reported    No    Warm-up and Cool-down Performed on first and last piece of equipment    Resistance Training Performed Yes    VAD Patient? No    PAD/SET Patient? No      Pain Assessment   Currently in Pain? No/denies              Social History   Tobacco Use  Smoking Status Former Smoker  . Packs/day: 1.00  . Years: 47.00  . Pack years: 47.00  . Quit date: 08/19/2014  . Years since quitting: 5.6  Smokeless Tobacco Former Systems developer  . Quit date: 08/19/2014    Goals Met:  Independence with exercise equipment Exercise tolerated well No report of cardiac concerns or symptoms Strength training completed today  Goals Unmet:  Not Applicable  Comments: First full day of exercise!  Patient was oriented to gym and equipment including functions, settings, policies, and procedures.  Patient's individual exercise prescription and treatment plan were reviewed.  All starting workloads were established based on the results of the 6 minute walk test done at initial orientation visit.  The plan for exercise progression was also introduced and progression will be customized based on patient's performance and goals.    Dr. Emily Filbert is Medical Director for [facility] and [facility].

## 2020-04-09 ENCOUNTER — Other Ambulatory Visit: Payer: Self-pay

## 2020-04-09 DIAGNOSIS — I213 ST elevation (STEMI) myocardial infarction of unspecified site: Secondary | ICD-10-CM

## 2020-04-09 DIAGNOSIS — Z955 Presence of coronary angioplasty implant and graft: Secondary | ICD-10-CM | POA: Diagnosis not present

## 2020-04-09 NOTE — Progress Notes (Signed)
Daily Session Note  Patient Details  Name: Kayla Maxwell MRN: [id number] Date of Birth: May 27, 1945 Referring Provider:   Flowsheet Row Cardiac Rehab from 03/24/2020 in [facility] Cardiac and Pulmonary Rehab  Referring Provider Serafina Royals  MD      Encounter Date: 04/09/2020  Check In:  Session Check In - 04/09/20 1339      Check-In   Supervising physician immediately available to respond to emergencies See telemetry face sheet for immediately available ER MD    Location [facility]    Staff Present Birdie Sons, MPA, Nino Glow, MS Exercise Physiologist;Meredith Sherryll Burger, RN BSN    Virtual Visit No    Medication changes reported     No    Fall or balance concerns reported    No    Warm-up and Cool-down Performed on first and last piece of equipment    Resistance Training Performed Yes    VAD Patient? No    PAD/SET Patient? No      Pain Assessment   Currently in Pain? No/denies              Social History   Tobacco Use  Smoking Status Former Smoker  . Packs/day: 1.00  . Years: 47.00  . Pack years: 47.00  . Quit date: 08/19/2014  . Years since quitting: 5.6  Smokeless Tobacco Former Systems developer  . Quit date: 08/19/2014    Goals Met:  Independence with exercise equipment Exercise tolerated well No report of cardiac concerns or symptoms Strength training completed today  Goals Unmet:  Not Applicable  Comments: Pt able to follow exercise prescription today without complaint.  Will continue to monitor for progression.    Dr. Emily Filbert is Medical Director for [place] and [facility].

## 2020-04-09 NOTE — Progress Notes (Signed)
Completed initial RD evaluation 

## 2020-04-14 ENCOUNTER — Other Ambulatory Visit: Payer: Self-pay

## 2020-04-14 DIAGNOSIS — I213 ST elevation (STEMI) myocardial infarction of unspecified site: Secondary | ICD-10-CM

## 2020-04-14 DIAGNOSIS — Z955 Presence of coronary angioplasty implant and graft: Secondary | ICD-10-CM | POA: Diagnosis not present

## 2020-04-14 NOTE — Progress Notes (Signed)
Daily Session Note  Patient Details  Name: DILIA ALEMANY MRN: [id number] Date of Birth: 10-Nov-1945 Referring Provider:   Flowsheet Row Cardiac Rehab from 03/24/2020 in [facility] Cardiac and Pulmonary Rehab  Referring Provider Serafina Royals  MD      Encounter Date: 04/14/2020  Check In:  Session Check In - 04/14/20 1343      Check-In   Supervising physician immediately available to respond to emergencies See telemetry face sheet for immediately available ER MD    Location [facility]    Staff Present Birdie Sons, MPA, Mauricia Area, BS, ACSM CEP, Exercise Physiologist;Kara Eliezer Bottom, MS Exercise Physiologist    Virtual Visit No    Medication changes reported     No    Fall or balance concerns reported    No    Warm-up and Cool-down Performed on first and last piece of equipment    Resistance Training Performed Yes    VAD Patient? No    PAD/SET Patient? No      Pain Assessment   Currently in Pain? No/denies              Social History   Tobacco Use  Smoking Status Former Smoker  . Packs/day: 1.00  . Years: 47.00  . Pack years: 47.00  . Quit date: 08/19/2014  . Years since quitting: 5.6  Smokeless Tobacco Former Systems developer  . Quit date: 08/19/2014    Goals Met:  Independence with exercise equipment Exercise tolerated well Personal goals reviewed No report of cardiac concerns or symptoms Strength training completed today  Goals Unmet:  Not Applicable  Comments: Pt able to follow exercise prescription today without complaint.  Will continue to monitor for progression.    Dr. Emily Filbert is Medical Director for [place] and [facility].

## 2020-04-21 ENCOUNTER — Telehealth: Payer: Self-pay | Admitting: Physician Assistant

## 2020-04-21 ENCOUNTER — Encounter: Payer: Medicare Other | Admitting: *Deleted

## 2020-04-21 ENCOUNTER — Other Ambulatory Visit: Payer: Self-pay

## 2020-04-21 DIAGNOSIS — Z955 Presence of coronary angioplasty implant and graft: Secondary | ICD-10-CM | POA: Diagnosis not present

## 2020-04-21 DIAGNOSIS — I70222 Atherosclerosis of native arteries of extremities with rest pain, left leg: Secondary | ICD-10-CM

## 2020-04-21 DIAGNOSIS — I213 ST elevation (STEMI) myocardial infarction of unspecified site: Secondary | ICD-10-CM

## 2020-04-21 NOTE — Telephone Encounter (Signed)
Medication Refill - Medication:   HYDROcodone-acetaminophen (NORCO) 5-325 MG tablet    Has the patient contacted their pharmacy? No. controlled substance, need doctor approval.  (Agent: If no, request that the patient contact the pharmacy for the refill.) (Agent: If yes, when and what did the pharmacy advise?)  Preferred Pharmacy (with phone number or street name):   [id number] - [place], [address]  [address] [place] [address]  Phone: [phone] Fax: [phone]      Agent: Please be advised that RX refills may take up to 3 business days. We ask that you follow-up with your pharmacy.

## 2020-04-21 NOTE — Telephone Encounter (Signed)
Requested medication (s) are due for refill today:  yes  Requested medication (s) are on the active medication list: yes  Last refill: 03/24/2020  Future visit scheduled: no  Notes to clinic: this refill cannot be delegated    Requested Prescriptions  Pending Prescriptions Disp Refills   HYDROcodone-acetaminophen (NORCO) 5-325 MG tablet 90 tablet 0    Sig: Take 1-2 tablets by mouth every 8 (eight) hours as needed for moderate pain or severe pain.      Not Delegated - Analgesics:  Opioid Agonist Combinations Failed - 04/21/2020 12:41 PM      Failed - This refill cannot be delegated      Failed - Urine Drug Screen completed in last 360 days      Passed - Valid encounter within last 6 months    Recent Outpatient Visits           2 months ago Nasal congestion   [place], [place]   2 months ago ST elevation myocardial infarction involving left anterior descending (LAD) coronary artery [facility])   [facility] [place], [place], [place]   8 months ago Diverticulitis   [place], [facility]   8 months ago Leukocytosis, unspecified type   [place], [place]   11 months ago Benign essential HTN   [facility] [place], [place], [place]

## 2020-04-21 NOTE — Progress Notes (Signed)
Daily Session Note  Patient Details  Name: Kayla Maxwell MRN: [id number] Date of Birth: December 27, 1945 Referring Provider:   Flowsheet Row Cardiac Rehab from 03/24/2020 in [facility]  Referring Provider Serafina Royals  MD      Encounter Date: 04/21/2020  Check In:  Session Check In - 04/21/20 1356      Check-In   Supervising physician immediately available to respond to emergencies See telemetry face sheet for immediately available ER MD    Location [facility]    Staff Present Heath Lark, RN, BSN, Laveda Norman, BS, ACSM CEP, Exercise Physiologist;Joseph Tessie Fass RCP,RRT,BSRT    Virtual Visit No    Medication changes reported     No    Fall or balance concerns reported    No    Warm-up and Cool-down Performed on first and last piece of equipment    Resistance Training Performed Yes    VAD Patient? No    PAD/SET Patient? No      Pain Assessment   Currently in Pain? No/denies              Social History   Tobacco Use  Smoking Status Former Smoker  . Packs/day: 1.00  . Years: 47.00  . Pack years: 47.00  . Quit date: 08/19/2014  . Years since quitting: 5.6  Smokeless Tobacco Former Systems developer  . Quit date: 08/19/2014    Goals Met:  Exercise tolerated well No report of cardiac concerns or symptoms  Goals Unmet:  Not Applicable  Comments:  While on the TM for less than 45 seconds, Myrikal got short of breath , then got the dry heaves. This lasted  For about 5 minutes and then she felt better. HR increased to 113 during this time. She stated that this is normal for her and that there is not any known trigger. It just happens.  Skin warm and dry, BP and HR in good range. She went home escorted by her aide.     Dr. Emily Filbert is Medical Director for [place] [facility].

## 2020-04-23 ENCOUNTER — Encounter: Payer: Self-pay | Admitting: *Deleted

## 2020-04-23 DIAGNOSIS — I213 ST elevation (STEMI) myocardial infarction of unspecified site: Secondary | ICD-10-CM

## 2020-04-23 DIAGNOSIS — Z955 Presence of coronary angioplasty implant and graft: Secondary | ICD-10-CM

## 2020-04-23 MED ORDER — HYDROCODONE-ACETAMINOPHEN 5-325 MG PO TABS: 1.0000 | ORAL_TABLET | Freq: Three times a day (TID) | ORAL | 0 refills | Status: DC | PRN

## 2020-04-23 NOTE — Progress Notes (Signed)
Cardiac Individual Treatment Plan  Patient Details  Name: Kayla Maxwell MRN: [id number] Date of Birth: 27-Dec-1945 Referring Provider:   Flowsheet Row Cardiac Rehab from 03/24/2020 in [facility] Cardiac and Pulmonary Rehab  Referring Provider Serafina Royals  MD      Initial Encounter Date:  Flowsheet Row Cardiac Rehab from 03/24/2020 in [facility] Cardiac and Pulmonary Rehab  Date 03/24/20      Visit Diagnosis: ST elevation myocardial infarction (STEMI), unspecified artery [facility])  Status post coronary artery stent placement  Patient's Home Medications on Admission:  Current Outpatient Medications:  .  albuterol (PROVENTIL HFA;VENTOLIN HFA) 108 (90 Base) MCG/ACT inhaler, Inhale 2 puffs into the lungs every 6 (six) hours as needed for wheezing or shortness of breath., Disp: 1 Inhaler, Rfl: 5 .  aspirin EC 81 MG EC tablet, Take 1 tablet (81 mg total) by mouth daily., Disp: 30 tablet, Rfl: 1 .  dapagliflozin propanediol (FARXIGA) 5 MG TABS tablet, Take 1 tablet (5 mg total) by mouth daily., Disp: 30 tablet, Rfl: 2 .  FLUoxetine (PROZAC) 20 MG capsule, TAKE 1 CAPSULE BY MOUTH EVERY DAY, Disp: 90 capsule, Rfl: 1 .  furosemide (LASIX) 20 MG tablet, Take 1 tablet (20 mg total) by mouth daily as needed for fluid or edema., Disp: 30 tablet, Rfl: 2 .  gabapentin (NEURONTIN) 300 MG capsule, TAKE 1 CAPSULE BY MOUTH EVERYDAY AT BEDTIME (Patient taking differently: Take 300 mg by mouth at bedtime.), Disp: 30 capsule, Rfl: 3 .  HYDROcodone-acetaminophen (NORCO) 5-325 MG tablet, Take 1-2 tablets by mouth every 8 (eight) hours as needed for moderate pain or severe pain., Disp: 90 tablet, Rfl: 0 .  lisinopril (ZESTRIL) 2.5 MG tablet, TAKE 1 TABLET BY MOUTH EVERY DAY, Disp: 90 tablet, Rfl: 1 .  metoprolol tartrate (LOPRESSOR) 25 MG tablet, TAKE 2 TABLETS BY MOUTH TWICE A DAY, Disp: 360 tablet, Rfl: 1 .  midodrine (PROAMATINE) 2.5 MG tablet, Take 1 tablet (2.5 mg total) by mouth 3 (three) times daily with meals., Disp:  90 tablet, Rfl: 1 .  nitroGLYCERIN (NITROSTAT) 0.4 MG SL tablet, Place 1 tablet (0.4 mg total) under the tongue every 5 (five) minutes x 3 doses as needed for chest pain., Disp: 30 tablet, Rfl: 12 .  omeprazole (PRILOSEC) 40 MG capsule, TAKE 1 CAPSULE BY MOUTH EVERY DAY, Disp: 90 capsule, Rfl: 1 .  ondansetron (ZOFRAN ODT) 4 MG disintegrating tablet, Take 1 tablet (4 mg total) by mouth every 8 (eight) hours as needed., Disp: 20 tablet, Rfl: 0 .  rivaroxaban (XARELTO) 20 MG TABS tablet, Take 20 mg by mouth daily with supper. , Disp: , Rfl:  .  rosuvastatin (CRESTOR) 20 MG tablet, Take 1 tablet (20 mg total) by mouth daily., Disp: 90 tablet, Rfl: 1 .  ticagrelor (BRILINTA) 90 MG TABS tablet, Take 1 tablet (90 mg total) by mouth 2 (two) times daily., Disp: 60 tablet, Rfl: 11  Past Medical History: Past Medical History:  Diagnosis Date  . Acid reflux    takes Nexium daily  . Allergy    takes Singulair daily as needed  . Anemia   . Atrial fibrillation ([place])   . Cancer ([place]) AGE 75   CERVICAL cancer with removal  . CHF (congestive heart failure) ([place])   . Chronic back pain    stenosis  . Complication of anesthesia   . Constipation    takes Colace daily as needed  . Coronary artery disease   . Difficult intubation   . Dysrhythmia  atrial fibrillation dx 04/2014  . Headache    sinus   . History of blood transfusion   . History of shingles   . Hyperlipidemia    takes Pravastatin daily-started taking this 6 months ago  . Hypertension   . Lumbar surgical wound fluid collection   . Sleep apnea    CPAP  . Stroke ([place])    TIA'S X 2 --- one last yr and one this yr...lost her memory over 4-6 hrs.  . Stroke ([place]) 06-04-15   left with right side weakness  . TIA (transient ischemic attack)    showed up on an MRI but she never knew it  . Urinary frequency     Tobacco Use: Social History   Tobacco Use  Smoking Status Former Smoker  . Packs/day: 1.00  . Years: 47.00  . Pack years:  47.00  . Quit date: 08/19/2014  . Years since quitting: 5.6  Smokeless Tobacco Former Systems developer  . Quit date: 08/19/2014    Labs: Recent Review Flowsheet Data    Labs for ITP Cardiac and Pulmonary Rehab Latest Ref Rng & Units 04/11/2015 06/06/2015 06/30/2015 04/04/2017 02/13/2020   Cholestrol 0 - 200 mg/dL - - 138 134 106   LDLCALC 0 - 99 mg/dL - - 47 50 53   HDL >40 mg/dL - - 55 51 36(L)   Trlycerides <150 mg/dL - - 180(H) 165(H) 87   Hemoglobin A1c 4.8 - 5.6 % 5.8 5.8 - 6.0(H) -       Exercise Target Goals: Exercise Program Goal: Individual exercise prescription set using results from initial 6 min walk test and THRR while considering  patient's activity barriers and safety.   Exercise Prescription Goal: Initial exercise prescription builds to 30-45 minutes a day of aerobic activity, 2-3 days per week.  Home exercise guidelines will be given to patient during program as part of exercise prescription that the participant will acknowledge.   Education: Aerobic Exercise: - Group verbal and visual presentation on the components of exercise prescription. Introduces F.I.T.T principle from ACSM for exercise prescriptions.  Reviews F.I.T.T. principles of aerobic exercise including progression. Written material given at graduation. Flowsheet Row Cardiac Rehab from 03/24/2020 in [facility] Cardiac and Pulmonary Rehab  Education need identified 03/24/20      Education: Resistance Exercise: - Group verbal and visual presentation on the components of exercise prescription. Introduces F.I.T.T principle from ACSM for exercise prescriptions  Reviews F.I.T.T. principles of resistance exercise including progression. Written material given at graduation.    Education: Exercise & Equipment Safety: - Individual verbal instruction and demonstration of equipment use and safety with use of the equipment. Flowsheet Row Cardiac Rehab from 03/24/2020 in [facility] Cardiac and Pulmonary Rehab  Date 03/24/20  Educator [facility]   Instruction Review Code 1- Verbalizes Understanding      Education: Exercise Physiology & General Exercise Guidelines: - Group verbal and written instruction with models to review the exercise physiology of the cardiovascular system and associated critical values. Provides general exercise guidelines with specific guidelines to those with heart or lung disease.    Education: Flexibility, Balance, Mind/Body Relaxation: - Group verbal and visual presentation with interactive activity on the components of exercise prescription. Introduces F.I.T.T principle from ACSM for exercise prescriptions. Reviews F.I.T.T. principles of flexibility and balance exercise training including progression. Also discusses the mind body connection.  Reviews various relaxation techniques to help reduce and manage stress (i.e. Deep breathing, progressive muscle relaxation, and visualization). Balance handout provided to take home. Written material  given at graduation.   Activity Barriers & Risk Stratification:  Activity Barriers & Cardiac Risk Stratification - 03/24/20 1410      Activity Barriers & Cardiac Risk Stratification   Activity Barriers Back Problems;Other (comment);Deconditioning;Muscular Weakness;Shortness of Breath;Balance Concerns    Comments hx of stent in groin, nerve pain, chronic back pain, and pain in feet    Cardiac Risk Stratification High           6 Minute Walk:  6 Minute Walk    Row Name 03/24/20 1409         6 Minute Walk   Phase Initial     Distance 400 feet     Walk Time 4.02 minutes     # of Rest Breaks 5  5 sec, 7 sec, 7 sec, 1:10, 8 sec, stopped at 5:42     MPH 1.13     METS 1.09     RPE 17     Perceived Dyspnea  3     VO2 Peak 3.84     Symptoms Yes (comment)     Comments SOB, chronic back pain 6/10     Resting HR 79 bpm     Resting BP 134/64     Resting Oxygen Saturation  93 %     Exercise Oxygen Saturation  during 6 min walk 91 %     Max Ex. HR 100 bpm     Max  Ex. BP 148/82     2 Minute Post BP 122/66            Oxygen Initial Assessment:   Oxygen Re-Evaluation:   Oxygen Discharge (Final Oxygen Re-Evaluation):   Initial Exercise Prescription:  Initial Exercise Prescription - 03/24/20 1400      Date of Initial Exercise RX and Referring Provider   Date 03/24/20    Referring Provider Serafina Royals  MD      Treadmill   MPH 0.8    Grade 0    Minutes 5    METs 1.6      Recumbant Bike   Level 1    RPM 50    Watts 10    Minutes 15    METs 1.5      NuStep   Level 1    SPM 80    Minutes 15    METs 1.5      REL-XR   Level 1    Speed 50    Minutes 15    METs 1.5      Prescription Details   Frequency (times per week) 2    Duration Progress to 30 minutes of continuous aerobic without signs/symptoms of physical distress      Intensity   THRR 40-80% of Max Heartrate 106-133    Ratings of Perceived Exertion 11-13    Perceived Dyspnea 0-4      Progression   Progression Continue to progress workloads to maintain intensity without signs/symptoms of physical distress.      Resistance Training   Training Prescription Yes    Weight 3 lb    Reps 10-15           Perform Capillary Blood Glucose checks as needed.  Exercise Prescription Changes:  Exercise Prescription Changes    Row Name 03/24/20 1400             Response to Exercise   Blood Pressure (Admit) 134/64       Blood Pressure (Exercise) 148/82       Blood  Pressure (Exit) 122/66       Heart Rate (Admit) 79 bpm       Heart Rate (Exercise) 100 bpm       Heart Rate (Exit) 85 bpm       Oxygen Saturation (Admit) 93 %       Oxygen Saturation (Exercise) 91 %       Rating of Perceived Exertion (Exercise) 17       Perceived Dyspnea (Exercise) 3       Symptoms SOB, chronic back pain 6/10       Comments walk test results              Exercise Comments:  Exercise Comments    Row Name 04/07/20 1343 04/21/20 1429         Exercise Comments First full  day of exercise!  Patient was oriented to gym and equipment including functions, settings, policies, and procedures.  Patient's individual exercise prescription and treatment plan were reviewed.  All starting workloads were established based on the results of the 6 minute walk test done at initial orientation visit.  The plan for exercise progression was also introduced and progression will be customized based on patient's performance and goals. While on the TM for less than 45 seconds, Kayla Maxwell got short of breath , then got the dry heaves. This lasted  For about 5 minutes and then she felt better. HR increased to 113 during this time. She stated that this is normal for her and that there is not any known trigger. It just happens.  Skin warm and dry, BP and HR in good range. She went home escorted by her aide.             Exercise Goals and Review:  Exercise Goals    Row Name 03/24/20 1415             Exercise Goals   Increase Physical Activity Yes       Intervention Provide advice, education, support and counseling about physical activity/exercise needs.;Develop an individualized exercise prescription for aerobic and resistive training based on initial evaluation findings, risk stratification, comorbidities and participant's personal goals.       Expected Outcomes Short Term: Attend rehab on a regular basis to increase amount of physical activity.;Long Term: Exercising regularly at least 3-5 days a week.;Long Term: Add in home exercise to make exercise part of routine and to increase amount of physical activity.       Increase Strength and Stamina Yes       Intervention Provide advice, education, support and counseling about physical activity/exercise needs.;Develop an individualized exercise prescription for aerobic and resistive training based on initial evaluation findings, risk stratification, comorbidities and participant's personal goals.       Expected Outcomes Short Term: Increase  workloads from initial exercise prescription for resistance, speed, and METs.;Short Term: Perform resistance training exercises routinely during rehab and add in resistance training at home;Long Term: Improve cardiorespiratory fitness, muscular endurance and strength as measured by increased METs and functional capacity (6MWT)       Able to understand and use rate of perceived exertion (RPE) scale Yes       Intervention Provide education and explanation on how to use RPE scale       Expected Outcomes Short Term: Able to use RPE daily in rehab to express subjective intensity level;Long Term:  Able to use RPE to guide intensity level when exercising independently       Able to  understand and use Dyspnea scale Yes       Intervention Provide education and explanation on how to use Dyspnea scale       Expected Outcomes Short Term: Able to use Dyspnea scale daily in rehab to express subjective sense of shortness of breath during exertion;Long Term: Able to use Dyspnea scale to guide intensity level when exercising independently       Knowledge and understanding of Target Heart Rate Range (THRR) Yes       Intervention Provide education and explanation of THRR including how the numbers were predicted and where they are located for reference       Expected Outcomes Short Term: Able to state/look up THRR;Short Term: Able to use daily as guideline for intensity in rehab;Long Term: Able to use THRR to govern intensity when exercising independently       Able to check pulse independently Yes       Intervention Provide education and demonstration on how to check pulse in carotid and radial arteries.;Review the importance of being able to check your own pulse for safety during independent exercise       Expected Outcomes Short Term: Able to explain why pulse checking is important during independent exercise;Long Term: Able to check pulse independently and accurately       Understanding of Exercise Prescription Yes        Intervention Provide education, explanation, and written materials on patient's individual exercise prescription       Expected Outcomes Short Term: Able to explain program exercise prescription;Long Term: Able to explain home exercise prescription to exercise independently              Exercise Goals Re-Evaluation :  Exercise Goals Re-Evaluation    Row Name 04/07/20 1343 04/14/20 1351           Exercise Goal Re-Evaluation   Exercise Goals Review Increase Physical Activity;Able to understand and use rate of perceived exertion (RPE) scale;Knowledge and understanding of Target Heart Rate Range (THRR);Understanding of Exercise Prescription;Increase Strength and Stamina;Able to understand and use Dyspnea scale;Able to check pulse independently Increase Physical Activity;Increase Strength and Stamina;Able to understand and use rate of perceived exertion (RPE) scale;Knowledge and understanding of Target Heart Rate Range (THRR);Understanding of Exercise Prescription      Comments Reviewed RPE and dyspnea scales, THR and program prescription with pt today.  Pt voiced understanding and was given a copy of goals to take home. Discussed patient progress with her exercise. She feels like she is starting to get stronger but stated that her initial exercise workloads Kayla Maxwell feel challenging. Discussed with patient how to properly procress with workload intensity when she gets stronger and current workloads are no longer challenging.      Expected Outcomes Short: Use RPE daily to regulate intensity. Long: Follow program prescription in THR. Short: evaluate workloads in the next week or 2 and progress with intensity if appropriate at that time. Long: Become indepndent with exercise program to continue to control cardiac risk factors.             Discharge Exercise Prescription (Final Exercise Prescription Changes):  Exercise Prescription Changes - 03/24/20 1400      Response to Exercise   Blood Pressure  (Admit) 134/64    Blood Pressure (Exercise) 148/82    Blood Pressure (Exit) 122/66    Heart Rate (Admit) 79 bpm    Heart Rate (Exercise) 100 bpm    Heart Rate (Exit) 85 bpm    Oxygen  Saturation (Admit) 93 %    Oxygen Saturation (Exercise) 91 %    Rating of Perceived Exertion (Exercise) 17    Perceived Dyspnea (Exercise) 3    Symptoms SOB, chronic back pain 6/10    Comments walk test results           Nutrition:  Target Goals: Understanding of nutrition guidelines, daily intake of sodium 1500mg , cholesterol 200mg , calories 30% from fat and 7% or less from saturated fats, daily to have 5 or more servings of fruits and vegetables.  Education: All About Nutrition: -Group instruction provided by verbal, written material, interactive activities, discussions, models, and posters to present general guidelines for heart healthy nutrition including fat, fiber, MyPlate, the role of sodium in heart healthy nutrition, utilization of the nutrition label, and utilization of this knowledge for meal planning. Follow up email sent as well. Written material given at graduation. Flowsheet Row Cardiac Rehab from 03/24/2020 in [facility] Cardiac and Pulmonary Rehab  Education need identified 03/24/20      Biometrics:  Pre Biometrics - 03/24/20 1416      Pre Biometrics   Height 5' 3.9" (1.623 m)    Weight 168 lb 11.2 oz (76.5 kg)    BMI (Calculated) 29.05    Single Leg Stand 3.2 seconds            Nutrition Therapy Plan and Nutrition Goals:  Nutrition Therapy & Goals - 04/09/20 1606      Nutrition Therapy   Diet Heart healthy, low Na    Protein (specify units) 60g    Fiber 25 grams    Whole Grain Foods 3 servings    Saturated Fats 12 max. grams    Fruits and Vegetables 8 servings/day    Sodium 1.5 grams      Personal Nutrition Goals   Nutrition Goal ST: Eat a rainbow of vegetables with dinner, have either starchy vegetables or grains with dinner as well LT: meet nutritional needs with one  meal per day    Comments Kayla Maxwell reports that she lost her appetite since her heart attack. Hasn't eaten anythign today - now 4pm. She enjoys fish and roasted chicken. 1 meal per day - brussells sprouts or green beans or another kind of green vegetable. Saute most of her foods - olive oil. She has whole wheat bread as a sandwich - chicken sandwich.  She cooks with very little salt - she has cut back. Drinks: water and milk (chocolate or whole milk). She was always eating one meal, but portions have cut down due to appetite. Similar diet to that before the heart attack. Weight stable - within 5 pounds. She has low energy since the heart attack, does not change with eating. Discussed heart healthy eating.      Intervention Plan   Intervention Nutrition handout(s) given to patient.;Prescribe, educate and counsel regarding individualized specific dietary modifications aiming towards targeted core components such as weight, hypertension, lipid management, diabetes, heart failure and other comorbidities.    Expected Outcomes Short Term Goal: Understand basic principles of dietary content, such as calories, fat, sodium, cholesterol and nutrients.;Short Term Goal: A plan has been developed with personal nutrition goals set during dietitian appointment.;Long Term Goal: Adherence to prescribed nutrition plan.           Nutrition Assessments:  MEDIFICTS Score Key:  ?70 Need to make dietary changes   40-70 Heart Healthy Diet  ? 40 Therapeutic Level Cholesterol Diet  Flowsheet Row Cardiac Rehab from 03/24/2020 in [facility] Cardiac  and Pulmonary Rehab  Picture Your Plate Total Score on Admission 74     Picture Your Plate Scores:  <52 Unhealthy dietary pattern with much room for improvement.  41-50 Dietary pattern unlikely to meet recommendations for good health and room for improvement.  51-60 More healthful dietary pattern, with some room for improvement.   >60 Healthy dietary pattern, although there  may be some specific behaviors that could be improved.    Nutrition Goals Re-Evaluation:  Nutrition Goals Re-Evaluation    [place] Name 04/14/20 1359             Goals   Comment Spoke with program dietician on 4/6 to set initial goals.       Expected Outcome Short: work towards goals set by dietician at recent meeting. Long: adapt to heart healthy nutrician plan to help control cardiac risk factors.              Nutrition Goals Discharge (Final Nutrition Goals Re-Evaluation):  Nutrition Goals Re-Evaluation - 04/14/20 1359      Goals   Comment Spoke with program dietician on 4/6 to set initial goals.    Expected Outcome Short: work towards goals set by dietician at recent meeting. Long: adapt to heart healthy nutrician plan to help control cardiac risk factors.           Psychosocial: Target Goals: Acknowledge presence or absence of significant depression and/or stress, maximize coping skills, provide positive support system. Participant is able to verbalize types and ability to use techniques and skills needed for reducing stress and depression.   Education: Stress, Anxiety, and Depression - Group verbal and visual presentation to define topics covered.  Reviews how body is impacted by stress, anxiety, and depression.  Also discusses healthy ways to reduce stress and to treat/manage anxiety and depression.  Written material given at graduation.   Education: Sleep Hygiene -Provides group verbal and written instruction about how sleep can affect your health.  Define sleep hygiene, discuss sleep cycles and impact of sleep habits. Review good sleep hygiene tips.    Initial Review & Psychosocial Screening:  Initial Psych Review & Screening - 03/17/20 1413      Initial Review   Current issues with Current Anxiety/Panic;Current Stress Concerns    Source of Stress Concerns Chronic Illness      Family Dynamics   Good Support System? Yes   sister     Barriers   Psychosocial  barriers to participate in program There are no identifiable barriers or psychosocial needs.;The patient should benefit from training in stress management and relaxation.      Screening Interventions   Interventions To provide support and resources with identified psychosocial needs;Encouraged to exercise;Provide feedback about the scores to participant    Expected Outcomes Short Term goal: Utilizing psychosocial counselor, staff and physician to assist with identification of specific Stressors or current issues interfering with healing process. Setting desired goal for each stressor or current issue identified.;Long Term Goal: Stressors or current issues are controlled or eliminated.;Short Term goal: Identification and review with participant of any Quality of Life or Depression concerns found by scoring the questionnaire.;Long Term goal: The participant improves quality of Life and PHQ9 Scores as seen by post scores and/or verbalization of changes           Quality of Life Scores:   Quality of Life - 03/24/20 1416      Quality of Life   Select Quality of Life      Quality  of Life Scores   Health/Function Pre 9.97 %    Socioeconomic Pre 30 %    Psych/Spiritual Pre 15.43 %    Family Pre 27.6 %    GLOBAL Pre 17.81 %          Scores of 19 and below usually indicate a poorer quality of life in these areas.  A difference of  2-3 points is a clinically meaningful difference.  A difference of 2-3 points in the total score of the Quality of Life Index has been associated with significant improvement in overall quality of life, self-image, physical symptoms, and general health in studies assessing change in quality of life.  PHQ-9: Recent Review Flowsheet Data    Depression screen [facility] 2/9 03/24/2020 02/18/2020 08/23/2019 08/22/2018 03/09/2017   Decreased Interest 1 0 0 0 0   Down, Depressed, Hopeless 1 0 0 0 1   PHQ - 2 Score 2 0 0 0 1   Altered sleeping 0 1 - - -   Tired, decreased energy 3 3 -  - -   Change in appetite 2 2 - - -   Feeling bad or failure about yourself  1 0 - - -   Trouble concentrating 0 0 - - -   Moving slowly or fidgety/restless 0 0 - - -   Suicidal thoughts 0 0 - - -   PHQ-9 Score 8 6 - - -   Difficult doing work/chores Somewhat difficult Not difficult at all - - -     Interpretation of Total Score  Total Score Depression Severity:  1-4 = Minimal depression, 5-9 = Mild depression, 10-14 = Moderate depression, 15-19 = Moderately severe depression, 20-27 = Severe depression   Psychosocial Evaluation and Intervention:  Psychosocial Evaluation - 03/17/20 1430      Psychosocial Evaluation & Interventions   Interventions Stress management education;Relaxation education;Encouraged to exercise with the program and follow exercise prescription    Comments Kayla Maxwell reports doing okay post MI. She Kayla Maxwell is having issues sleeping, some of it related to anxiety from her CPAP mask. She doesn't report any pain, but increased weakness. Her sister is her main support system and helps her daily. She is looking forward to coming to Cardiac Rehab for education and exercise to boost her stamina and strength. She does struggle with a decreased appetite which is new post MI.    Expected Outcomes Short: attend cardiac rehab for education and exercise. Long: develop and maintain positive self care    Continue Psychosocial Services  Follow up required by staff           Psychosocial Re-Evaluation:  Psychosocial Re-Evaluation    [place] Name 04/14/20 1401             Psychosocial Re-Evaluation   Current issues with Current Anxiety/Panic;Current Stress Concerns       Comments Kayla Maxwell report no new stress or sleep concerns. She reports that she continues to have a strong support system and is sleeping better. She has had a smaller appitite after her MI. We discussed that improtance of meeting her caloric needs even though her appitite is not a big.       Expected Outcomes Short:  continue healthy sleeping pattern. Long: maintain good mental health and stress management.       Interventions Encouraged to attend Cardiac Rehabilitation for the exercise       Continue Psychosocial Services  Follow up required by staff  Psychosocial Discharge (Final Psychosocial Re-Evaluation):  Psychosocial Re-Evaluation - 04/14/20 1401      Psychosocial Re-Evaluation   Current issues with Current Anxiety/Panic;Current Stress Concerns    Comments Kayla Maxwell report no new stress or sleep concerns. She reports that she continues to have a strong support system and is sleeping better. She has had a smaller appitite after her MI. We discussed that improtance of meeting her caloric needs even though her appitite is not a big.    Expected Outcomes Short: continue healthy sleeping pattern. Long: maintain good mental health and stress management.    Interventions Encouraged to attend Cardiac Rehabilitation for the exercise    Continue Psychosocial Services  Follow up required by staff           Vocational Rehabilitation: Provide vocational rehab assistance to qualifying candidates.   Vocational Rehab Evaluation & Intervention:  Vocational Rehab - 03/17/20 1413      Initial Vocational Rehab Evaluation & Intervention   Assessment shows need for Vocational Rehabilitation No           Education: Education Goals: Education classes will be provided on a variety of topics geared toward better understanding of heart health and risk factor modification. Participant will state understanding/return demonstration of topics presented as noted by education test scores.  Learning Barriers/Preferences:  Learning Barriers/Preferences - 03/17/20 1413      Learning Barriers/Preferences   Learning Barriers None    Learning Preferences None           General Cardiac Education Topics:  AED/CPR: - Group verbal and written instruction with the use of models to demonstrate the basic  use of the AED with the basic ABC's of resuscitation.   Anatomy and Cardiac Procedures: - Group verbal and visual presentation and models provide information about basic cardiac anatomy and function. Reviews the testing methods done to diagnose heart disease and the outcomes of the test results. Describes the treatment choices: Medical Management, Angioplasty, or Coronary Bypass Surgery for treating various heart conditions including Myocardial Infarction, Angina, Valve Disease, and Cardiac Arrhythmias.  Written material given at graduation. Flowsheet Row Cardiac Rehab from 03/24/2020 in [facility] Cardiac and Pulmonary Rehab  Education need identified 03/24/20      Medication Safety: - Group verbal and visual instruction to review commonly prescribed medications for heart and lung disease. Reviews the medication, class of the drug, and side effects. Includes the steps to properly store meds and maintain the prescription regimen.  Written material given at graduation.   Intimacy: - Group verbal instruction through game format to discuss how heart and lung disease can affect sexual intimacy. Written material given at graduation..   Know Your Numbers and Heart Failure: - Group verbal and visual instruction to discuss disease risk factors for cardiac and pulmonary disease and treatment options.  Reviews associated critical values for Overweight/Obesity, Hypertension, Cholesterol, and Diabetes.  Discusses basics of heart failure: signs/symptoms and treatments.  Introduces Heart Failure Zone chart for action plan for heart failure.  Written material given at graduation.   Infection Prevention: - Provides verbal and written material to individual with discussion of infection control including proper hand washing and proper equipment cleaning during exercise session. Flowsheet Row Cardiac Rehab from 03/24/2020 in [facility] Cardiac and Pulmonary Rehab  Date 03/24/20  Educator [facility]  Instruction Review Code 1-  Verbalizes Understanding      Falls Prevention: - Provides verbal and written material to individual with discussion of falls prevention and safety. Flowsheet Row Cardiac Rehab from  03/24/2020 in [facility] Cardiac and Pulmonary Rehab  Date 03/24/20  Educator [facility]  Instruction Review Code 1- Verbalizes Understanding      Other: -Provides group and verbal instruction on various topics (see comments)   Knowledge Questionnaire Score:  Knowledge Questionnaire Score - 03/24/20 1419      Knowledge Questionnaire Score   Pre Score 23/26 Education Focus: angina, nurtition, exercise           Core Components/Risk Factors/Patient Goals at Admission:  Personal Goals and Risk Factors at Admission - 03/24/20 1420      Core Components/Risk Factors/Patient Goals on Admission    Weight Management Yes;Weight Loss;Weight Maintenance    Intervention Weight Management: Develop a combined nutrition and exercise program designed to reach desired caloric intake, while maintaining appropriate intake of nutrient and fiber, sodium and fats, and appropriate energy expenditure required for the weight goal.;Weight Management: Provide education and appropriate resources to help participant work on and attain dietary goals.;Weight Management/Obesity: Establish reasonable short term and long term weight goals.    Admit Weight 168 lb 11.2 oz (76.5 kg)    Goal Weight: Short Term 165 lb (74.8 kg)    Goal Weight: Long Term 165 lb (74.8 kg)    Expected Outcomes Long Term: Adherence to nutrition and physical activity/exercise program aimed toward attainment of established weight goal;Short Term: Continue to assess and modify interventions until short term weight is achieved;Weight Maintenance: Understanding of the daily nutrition guidelines, which includes 25-35% calories from fat, 7% or less cal from saturated fats, less than 200mg  cholesterol, less than 1.5gm of sodium, & 5 or more servings of fruits and vegetables  daily;Understanding recommendations for meals to include 15-35% energy as protein, 25-35% energy from fat, 35-60% energy from carbohydrates, less than 200mg  of dietary cholesterol, 20-35 gm of total fiber daily;Weight Loss: Understanding of general recommendations for a balanced deficit meal plan, which promotes 1-2 lb weight loss per week and includes a negative energy balance of [phone] kcal/d;Understanding of distribution of calorie intake throughout the day with the consumption of 4-5 meals/snacks    Heart Failure Yes    Intervention Provide a combined exercise and nutrition program that is supplemented with education, support and counseling about heart failure. Directed toward relieving symptoms such as shortness of breath, decreased exercise tolerance, and extremity edema.    Expected Outcomes Improve functional capacity of life;Short term: Attendance in program 2-3 days a week with increased exercise capacity. Reported lower sodium intake. Reported increased fruit and vegetable intake. Reports medication compliance.;Short term: Daily weights obtained and reported for increase. Utilizing diuretic protocols set by physician.;Long term: Adoption of self-care skills and reduction of barriers for early signs and symptoms recognition and intervention leading to self-care maintenance.    Hypertension Yes    Intervention Provide education on lifestyle modifcations including regular physical activity/exercise, weight management, moderate sodium restriction and increased consumption of fresh fruit, vegetables, and low fat dairy, alcohol moderation, and smoking cessation.;Monitor prescription use compliance.    Expected Outcomes Short Term: Continued assessment and intervention until BP is < 140/67mm HG in hypertensive participants. < 130/17mm HG in hypertensive participants with diabetes, heart failure or chronic kidney disease.;Long Term: Maintenance of blood pressure at goal levels.    Lipids Yes     Intervention Provide education and support for participant on nutrition & aerobic/resistive exercise along with prescribed medications to achieve LDL 70mg , HDL >40mg .    Expected Outcomes Short Term: Participant states understanding of desired cholesterol values and is compliant with  medications prescribed. Participant is following exercise prescription and nutrition guidelines.;Long Term: Cholesterol controlled with medications as prescribed, with individualized exercise RX and with personalized nutrition plan. Value goals: LDL < 70mg , HDL > 40 mg.           Education:Diabetes - Individual verbal and written instruction to review signs/symptoms of diabetes, desired ranges of glucose level fasting, after meals and with exercise. Acknowledge that pre and post exercise glucose checks will be done for 3 sessions at entry of program.   Core Components/Risk Factors/Patient Goals Review:   Goals and Risk Factor Review    Row Name 04/14/20 1355             Core Components/Risk Factors/Patient Goals Review   Personal Goals Review Weight Management/Obesity;Hypertension;Heart Failure;Lipids       Review Patient reports that she takes all medications at prescribed. She weighs every morning and keeps track to her weights. She reports that her weight has been consistent and knows the proticol of what to do when there are major changes in weight or heart failure symptoms.       Expected Outcomes Short: continue to attend cardiac rehab consistently and continue to weigh every day. Long: adapt to a heart healthy lifestyle to control cardiac risk factors.              Core Components/Risk Factors/Patient Goals at Discharge (Final Review):   Goals and Risk Factor Review - 04/14/20 1355      Core Components/Risk Factors/Patient Goals Review   Personal Goals Review Weight Management/Obesity;Hypertension;Heart Failure;Lipids    Review Patient reports that she takes all medications at prescribed. She  weighs every morning and keeps track to her weights. She reports that her weight has been consistent and knows the proticol of what to do when there are major changes in weight or heart failure symptoms.    Expected Outcomes Short: continue to attend cardiac rehab consistently and continue to weigh every day. Long: adapt to a heart healthy lifestyle to control cardiac risk factors.           ITP Comments:  ITP Comments    Row Name 03/17/20 1421 03/24/20 1409 03/26/20 1124 04/07/20 1342 04/09/20 1631   ITP Comments Initial telelphone orientation completed. Diagnosis can be found in [facility] 2/8. EP orientation scheduled for Monday 3/21 at 10am. Completed 6MWT and gym orientation. Initial ITP created and sent for review to Dr. Emily Filbert, Medical Director. 30 Day review completed. Medical Director ITP review done, changes made as directed, and signed approval by Medical Director.  New to program First full day of exercise!  Patient was oriented to gym and equipment including functions, settings, policies, and procedures.  Patient's individual exercise prescription and treatment plan were reviewed.  All starting workloads were established based on the results of the 6 minute walk test done at initial orientation visit.  The plan for exercise progression was also introduced and progression will be customized based on patient's performance and goals. Completed initial RD evaluation   Row Name 04/21/20 1429 04/23/20 0638         ITP Comments While on the TM for less than 45 seconds, Kayla Maxwell got short of breath , then got the dry heaves. This lasted  For about 5 minutes and then she felt better. HR increased to 113 during this time. She stated that this is normal for her and that there is not any known trigger. It just happens.  Skin warm and dry, BP and HR  in good range. She went home escorted by her aide. 30 Day review completed. Medical Director ITP review done, changes made as directed, and signed approval by  Medical Director.             Comments:

## 2020-04-23 NOTE — Telephone Encounter (Signed)
Pt following up on her request for this medication. Pt would like call back to give the status.  HYDROcodone-acetaminophen (NORCO) 5-325 MG tablet  [id number] - [place],  - [address]

## 2020-04-25 ENCOUNTER — Telehealth: Payer: Self-pay | Admitting: Physician Assistant

## 2020-04-25 NOTE — Telephone Encounter (Signed)
Please review , telephone encounter from 04/21/20 states that patient request refill on Hydrocodone

## 2020-04-25 NOTE — Telephone Encounter (Signed)
Candace, from [employer], calling regarding the PA for the medication, Omeprazole. She states that this medication is on the pts formulary. She states that she is needing to check with PCP to make sure that this was the only thing that was needed. She states that the pt is receiving 90 day supply. Please advise.   [phone]  Reference # [id number]

## 2020-04-28 ENCOUNTER — Other Ambulatory Visit: Payer: Self-pay

## 2020-04-28 ENCOUNTER — Telehealth: Payer: Self-pay

## 2020-04-28 DIAGNOSIS — Z955 Presence of coronary angioplasty implant and graft: Secondary | ICD-10-CM | POA: Diagnosis not present

## 2020-04-28 DIAGNOSIS — I213 ST elevation (STEMI) myocardial infarction of unspecified site: Secondary | ICD-10-CM | POA: Diagnosis not present

## 2020-04-28 NOTE — Progress Notes (Signed)
Daily Session Note  Patient Details  Name: Kayla Maxwell MRN: [id number] Date of Birth: [birth year] Referring Provider:   Flowsheet Row Cardiac Rehab from 03/24/2020 in [facility] Cardiac and Pulmonary Rehab  Referring Provider Serafina Royals  MD      Encounter Date: 04/28/2020  Check In:  Session Check In - 04/28/20 1333      Check-In   Supervising physician immediately available to respond to emergencies See telemetry face sheet for immediately available ER MD    Location [facility]-Cardiac & Pulmonary Rehab    Staff Present Birdie Sons, MPA, Mauricia Area, BS, ACSM CEP, Exercise Physiologist;Kara Eliezer Bottom, MS Exercise Physiologist    Virtual Visit No    Medication changes reported     No    Fall or balance concerns reported    No    Warm-up and Cool-down Performed on first and last piece of equipment    Resistance Training Performed Yes    VAD Patient? No    PAD/SET Patient? No      Pain Assessment   Currently in Pain? No/denies              Social History   Tobacco Use  Smoking Status Former Smoker  . Packs/day: 1.00  . Years: 47.00  . Pack years: 47.00  . Quit date: 08/19/2014  . Years since quitting: 5.6  Smokeless Tobacco Former Systems developer  . Quit date: 08/19/2014    Goals Met:  Independence with exercise equipment Exercise tolerated well No report of cardiac concerns or symptoms Strength training completed today  Goals Unmet:  Not Applicable  Comments: Pt able to follow exercise prescription today without complaint.  Will continue to monitor for progression.    Dr. Emily Filbert is Medical Director for [place] and [facility].

## 2020-04-28 NOTE — Telephone Encounter (Signed)
Please review. KW 

## 2020-04-28 NOTE — Telephone Encounter (Signed)
Copied from [place] [id number]. Topic: General - Other >> Apr 25, 2020  4:58 PM Celene Kras wrote: Reason for CRM: Remi Haggard, from [facility], calling in regards to the pts omeprazole. She states that the pt is requesting to have something on a lower teir to help with pricing. She states that she is following up with PCP to see if there were any other options that had been tried. Please advise.

## 2020-05-05 ENCOUNTER — Telehealth: Payer: Self-pay

## 2020-05-05 ENCOUNTER — Encounter: Payer: Medicare Other | Attending: Internal Medicine

## 2020-05-05 DIAGNOSIS — I213 ST elevation (STEMI) myocardial infarction of unspecified site: Secondary | ICD-10-CM

## 2020-05-05 DIAGNOSIS — Z955 Presence of coronary angioplasty implant and graft: Secondary | ICD-10-CM | POA: Insufficient documentation

## 2020-05-05 NOTE — Progress Notes (Signed)
Called Kayla Maxwell to check in as she has not been to rehab since 04/28/20. Kayla Maxwell reports sporadic shortness of breath and she was afraid to come back. She reports feeling better now and will be at rehab Wednesday. Encouraged her to contact her doctor if she is concerned regarding this.  

## 2020-05-05 NOTE — Telephone Encounter (Signed)
Called Kayla Maxwell to check in as she has not been to rehab since 04/28/20. Kayla Maxwell reports sporadic shortness of breath and she was afraid to come back. She reports feeling better now and will be at rehab Wednesday. Encouraged her to contact her doctor if she is concerned regarding this.

## 2020-05-06 DIAGNOSIS — M79672 Pain in left foot: Secondary | ICD-10-CM | POA: Diagnosis not present

## 2020-05-06 DIAGNOSIS — B351 Tinea unguium: Secondary | ICD-10-CM | POA: Diagnosis not present

## 2020-05-06 DIAGNOSIS — I739 Peripheral vascular disease, unspecified: Secondary | ICD-10-CM | POA: Diagnosis not present

## 2020-05-06 DIAGNOSIS — M79674 Pain in right toe(s): Secondary | ICD-10-CM | POA: Diagnosis not present

## 2020-05-06 DIAGNOSIS — G629 Polyneuropathy, unspecified: Secondary | ICD-10-CM | POA: Diagnosis not present

## 2020-05-06 DIAGNOSIS — M79675 Pain in left toe(s): Secondary | ICD-10-CM | POA: Diagnosis not present

## 2020-05-07 ENCOUNTER — Other Ambulatory Visit: Payer: Self-pay

## 2020-05-07 DIAGNOSIS — Z955 Presence of coronary angioplasty implant and graft: Secondary | ICD-10-CM | POA: Diagnosis not present

## 2020-05-07 DIAGNOSIS — I213 ST elevation (STEMI) myocardial infarction of unspecified site: Secondary | ICD-10-CM | POA: Diagnosis not present

## 2020-05-07 NOTE — Progress Notes (Signed)
Daily Session Note  Patient Details  Name: Kayla Maxwell MRN: [id number] Date of Birth: June 23, 1945 Referring Provider:   Flowsheet Row Cardiac Rehab from 03/24/2020 in [facility] Cardiac and Pulmonary Rehab  Referring Provider Serafina Royals  MD      Encounter Date: 05/07/2020  Check In:  Session Check In - 05/07/20 1341      Check-In   Supervising physician immediately available to respond to emergencies See telemetry face sheet for immediately available ER MD    Location [facility]-Cardiac & Pulmonary Rehab    Staff Present Birdie Sons, MPA, RN;Joseph Lou Miner, [place] Exercise Physiologist    Virtual Visit No    Medication changes reported     No    Fall or balance concerns reported    No    Warm-up and Cool-down Performed on first and last piece of equipment    Resistance Training Performed Yes    VAD Patient? No    PAD/SET Patient? No      Pain Assessment   Currently in Pain? No/denies              Social History   Tobacco Use  Smoking Status Former Smoker  . Packs/day: 1.00  . Years: 47.00  . Pack years: 47.00  . Quit date: 08/19/2014  . Years since quitting: 5.7  Smokeless Tobacco Former Systems developer  . Quit date: 08/19/2014    Goals Met:  Independence with exercise equipment Exercise tolerated well No report of cardiac concerns or symptoms Strength training completed today  Goals Unmet:  Not Applicable  Comments: Pt able to follow exercise prescription today without complaint.  Will continue to monitor for progression.    Dr. Emily Filbert is Medical Director for [place] and [facility].

## 2020-05-12 ENCOUNTER — Other Ambulatory Visit: Payer: Self-pay

## 2020-05-12 DIAGNOSIS — I213 ST elevation (STEMI) myocardial infarction of unspecified site: Secondary | ICD-10-CM

## 2020-05-12 DIAGNOSIS — Z955 Presence of coronary angioplasty implant and graft: Secondary | ICD-10-CM

## 2020-05-12 NOTE — Progress Notes (Signed)
Daily Session Note  Patient Details  Name: Kayla Maxwell MRN: [id number] Date of Birth: 1945/01/22 Referring Provider:   Flowsheet Row Cardiac Rehab from 03/24/2020 in [facility] Cardiac and Pulmonary Rehab  Referring Provider Serafina Royals  MD      Encounter Date: 05/12/2020  Check In:  Session Check In - 05/12/20 1343      Check-In   Supervising physician immediately available to respond to emergencies See telemetry face sheet for immediately available ER MD    Location [facility]-Cardiac & Pulmonary Rehab    Staff Present Birdie Sons, MPA, Mauricia Area, BS, ACSM CEP, Exercise Physiologist;Kara Eliezer Bottom, MS Exercise Physiologist    Virtual Visit No    Medication changes reported     No    Fall or balance concerns reported    No    Warm-up and Cool-down Performed on first and last piece of equipment    Resistance Training Performed Yes    VAD Patient? No    PAD/SET Patient? No      Pain Assessment   Currently in Pain? No/denies              Social History   Tobacco Use  Smoking Status Former Smoker  . Packs/day: 1.00  . Years: 47.00  . Pack years: 47.00  . Quit date: 08/19/2014  . Years since quitting: 5.7  Smokeless Tobacco Former Systems developer  . Quit date: 08/19/2014    Goals Met:  Independence with exercise equipment Exercise tolerated well Personal goals reviewed No report of cardiac concerns or symptoms Strength training completed today  Goals Unmet:  Not Applicable  Comments: Pt able to follow exercise prescription today without complaint.  Will continue to monitor for progression.    Dr. Emily Filbert is Medical Director for [place] and [facility].

## 2020-05-21 ENCOUNTER — Encounter: Payer: Self-pay | Admitting: *Deleted

## 2020-05-21 DIAGNOSIS — I213 ST elevation (STEMI) myocardial infarction of unspecified site: Secondary | ICD-10-CM

## 2020-05-21 DIAGNOSIS — I5021 Acute systolic (congestive) heart failure: Secondary | ICD-10-CM | POA: Diagnosis not present

## 2020-05-21 DIAGNOSIS — Z955 Presence of coronary angioplasty implant and graft: Secondary | ICD-10-CM

## 2020-05-21 NOTE — Progress Notes (Signed)
Cardiac Individual Treatment Plan  Patient Details  Name: Kayla Maxwell MRN: [id number] Date of Birth: 27-Dec-1945 Referring Provider:   Flowsheet Row Cardiac Rehab from 03/24/2020 in [facility] Cardiac and Pulmonary Rehab  Referring Provider Serafina Royals  MD      Initial Encounter Date:  Flowsheet Row Cardiac Rehab from 03/24/2020 in [facility] Cardiac and Pulmonary Rehab  Date 03/24/20      Visit Diagnosis: ST elevation myocardial infarction (STEMI), unspecified artery [facility])  Status post coronary artery stent placement  Patient's Home Medications on Admission:  Current Outpatient Medications:  .  albuterol (PROVENTIL HFA;VENTOLIN HFA) 108 (90 Base) MCG/ACT inhaler, Inhale 2 puffs into the lungs every 6 (six) hours as needed for wheezing or shortness of breath., Disp: 1 Inhaler, Rfl: 5 .  aspirin EC 81 MG EC tablet, Take 1 tablet (81 mg total) by mouth daily., Disp: 30 tablet, Rfl: 1 .  dapagliflozin propanediol (FARXIGA) 5 MG TABS tablet, Take 1 tablet (5 mg total) by mouth daily., Disp: 30 tablet, Rfl: 2 .  FLUoxetine (PROZAC) 20 MG capsule, TAKE 1 CAPSULE BY MOUTH EVERY DAY, Disp: 90 capsule, Rfl: 1 .  furosemide (LASIX) 20 MG tablet, Take 1 tablet (20 mg total) by mouth daily as needed for fluid or edema., Disp: 30 tablet, Rfl: 2 .  gabapentin (NEURONTIN) 300 MG capsule, TAKE 1 CAPSULE BY MOUTH EVERYDAY AT BEDTIME (Patient taking differently: Take 300 mg by mouth at bedtime.), Disp: 30 capsule, Rfl: 3 .  HYDROcodone-acetaminophen (NORCO) 5-325 MG tablet, Take 1-2 tablets by mouth every 8 (eight) hours as needed for moderate pain or severe pain., Disp: 90 tablet, Rfl: 0 .  lisinopril (ZESTRIL) 2.5 MG tablet, TAKE 1 TABLET BY MOUTH EVERY DAY, Disp: 90 tablet, Rfl: 1 .  metoprolol tartrate (LOPRESSOR) 25 MG tablet, TAKE 2 TABLETS BY MOUTH TWICE A DAY, Disp: 360 tablet, Rfl: 1 .  midodrine (PROAMATINE) 2.5 MG tablet, Take 1 tablet (2.5 mg total) by mouth 3 (three) times daily with meals., Disp:  90 tablet, Rfl: 1 .  nitroGLYCERIN (NITROSTAT) 0.4 MG SL tablet, Place 1 tablet (0.4 mg total) under the tongue every 5 (five) minutes x 3 doses as needed for chest pain., Disp: 30 tablet, Rfl: 12 .  omeprazole (PRILOSEC) 40 MG capsule, TAKE 1 CAPSULE BY MOUTH EVERY DAY, Disp: 90 capsule, Rfl: 1 .  ondansetron (ZOFRAN ODT) 4 MG disintegrating tablet, Take 1 tablet (4 mg total) by mouth every 8 (eight) hours as needed., Disp: 20 tablet, Rfl: 0 .  rivaroxaban (XARELTO) 20 MG TABS tablet, Take 20 mg by mouth daily with supper. , Disp: , Rfl:  .  rosuvastatin (CRESTOR) 20 MG tablet, Take 1 tablet (20 mg total) by mouth daily., Disp: 90 tablet, Rfl: 1 .  ticagrelor (BRILINTA) 90 MG TABS tablet, Take 1 tablet (90 mg total) by mouth 2 (two) times daily., Disp: 60 tablet, Rfl: 11  Past Medical History: Past Medical History:  Diagnosis Date  . Acid reflux    takes Nexium daily  . Allergy    takes Singulair daily as needed  . Anemia   . Atrial fibrillation ([place])   . Cancer ([place]) AGE 75   CERVICAL cancer with removal  . CHF (congestive heart failure) ([place])   . Chronic back pain    stenosis  . Complication of anesthesia   . Constipation    takes Colace daily as needed  . Coronary artery disease   . Difficult intubation   . Dysrhythmia  atrial fibrillation dx 04/2014  . Headache    sinus   . History of blood transfusion   . History of shingles   . Hyperlipidemia    takes Pravastatin daily-started taking this 6 months ago  . Hypertension   . Lumbar surgical wound fluid collection   . Sleep apnea    CPAP  . Stroke ([place])    TIA'S X 2 --- one last yr and one this yr...lost her memory over 4-6 hrs.  . Stroke ([place]) 06-04-15   left with right side weakness  . TIA (transient ischemic attack)    showed up on an MRI but she never knew it  . Urinary frequency     Tobacco Use: Social History   Tobacco Use  Smoking Status Former Smoker  . Packs/day: 1.00  . Years: 47.00  . Pack years:  47.00  . Quit date: 08/19/2014  . Years since quitting: 5.7  Smokeless Tobacco Former Systems developer  . Quit date: 08/19/2014    Labs: Recent Review Flowsheet Data    Labs for ITP Cardiac and Pulmonary Rehab Latest Ref Rng & Units 04/11/2015 06/06/2015 06/30/2015 04/04/2017 02/13/2020   Cholestrol 0 - 200 mg/dL - - 138 134 106   LDLCALC 0 - 99 mg/dL - - 47 50 53   HDL >40 mg/dL - - 55 51 36(L)   Trlycerides <150 mg/dL - - 180(H) 165(H) 87   Hemoglobin A1c 4.8 - 5.6 % 5.8 5.8 - 6.0(H) -       Exercise Target Goals: Exercise Program Goal: Individual exercise prescription set using results from initial 6 min walk test and THRR while considering  patient's activity barriers and safety.   Exercise Prescription Goal: Initial exercise prescription builds to 30-45 minutes a day of aerobic activity, 2-3 days per week.  Home exercise guidelines will be given to patient during program as part of exercise prescription that the participant will acknowledge.   Education: Aerobic Exercise: - Group verbal and visual presentation on the components of exercise prescription. Introduces F.I.T.T principle from ACSM for exercise prescriptions.  Reviews F.I.T.T. principles of aerobic exercise including progression. Written material given at graduation. Flowsheet Row Cardiac Rehab from 03/24/2020 in [facility] Cardiac and Pulmonary Rehab  Education need identified 03/24/20      Education: Resistance Exercise: - Group verbal and visual presentation on the components of exercise prescription. Introduces F.I.T.T principle from ACSM for exercise prescriptions  Reviews F.I.T.T. principles of resistance exercise including progression. Written material given at graduation.    Education: Exercise & Equipment Safety: - Individual verbal instruction and demonstration of equipment use and safety with use of the equipment. Flowsheet Row Cardiac Rehab from 03/24/2020 in [facility] Cardiac and Pulmonary Rehab  Date 03/24/20  Educator [facility]   Instruction Review Code 1- Verbalizes Understanding      Education: Exercise Physiology & General Exercise Guidelines: - Group verbal and written instruction with models to review the exercise physiology of the cardiovascular system and associated critical values. Provides general exercise guidelines with specific guidelines to those with heart or lung disease.    Education: Flexibility, Balance, Mind/Body Relaxation: - Group verbal and visual presentation with interactive activity on the components of exercise prescription. Introduces F.I.T.T principle from ACSM for exercise prescriptions. Reviews F.I.T.T. principles of flexibility and balance exercise training including progression. Also discusses the mind body connection.  Reviews various relaxation techniques to help reduce and manage stress (i.e. Deep breathing, progressive muscle relaxation, and visualization). Balance handout provided to take home. Written material  given at graduation.   Activity Barriers & Risk Stratification:  Activity Barriers & Cardiac Risk Stratification - 03/24/20 1410      Activity Barriers & Cardiac Risk Stratification   Activity Barriers Back Problems;Other (comment);Deconditioning;Muscular Weakness;Shortness of Breath;Balance Concerns    Comments hx of stent in groin, nerve pain, chronic back pain, and pain in feet    Cardiac Risk Stratification High           6 Minute Walk:  6 Minute Walk    Row Name 03/24/20 1409         6 Minute Walk   Phase Initial     Distance 400 feet     Walk Time 4.02 minutes     # of Rest Breaks 5  5 sec, 7 sec, 7 sec, 1:10, 8 sec, stopped at 5:42     MPH 1.13     METS 1.09     RPE 17     Perceived Dyspnea  3     VO2 Peak 3.84     Symptoms Yes (comment)     Comments SOB, chronic back pain 6/10     Resting HR 79 bpm     Resting BP 134/64     Resting Oxygen Saturation  93 %     Exercise Oxygen Saturation  during 6 min walk 91 %     Max Ex. HR 100 bpm     Max  Ex. BP 148/82     2 Minute Post BP 122/66            Oxygen Initial Assessment:   Oxygen Re-Evaluation:   Oxygen Discharge (Final Oxygen Re-Evaluation):   Initial Exercise Prescription:  Initial Exercise Prescription - 03/24/20 1400      Date of Initial Exercise RX and Referring Provider   Date 03/24/20    Referring Provider Serafina Royals  MD      Treadmill   MPH 0.8    Grade 0    Minutes 5    METs 1.6      Recumbant Bike   Level 1    RPM 50    Watts 10    Minutes 15    METs 1.5      NuStep   Level 1    SPM 80    Minutes 15    METs 1.5      REL-XR   Level 1    Speed 50    Minutes 15    METs 1.5      Prescription Details   Frequency (times per week) 2    Duration Progress to 30 minutes of continuous aerobic without signs/symptoms of physical distress      Intensity   THRR 40-80% of Max Heartrate 106-133    Ratings of Perceived Exertion 11-13    Perceived Dyspnea 0-4      Progression   Progression Continue to progress workloads to maintain intensity without signs/symptoms of physical distress.      Resistance Training   Training Prescription Yes    Weight 3 lb    Reps 10-15           Perform Capillary Blood Glucose checks as needed.  Exercise Prescription Changes:  Exercise Prescription Changes    Row Name 03/24/20 1400 04/29/20 1200 05/13/20 1400         Response to Exercise   Blood Pressure (Admit) 134/64 122/72 124/62     Blood Pressure (Exercise) 148/82 112/64 120/60     Blood  Pressure (Exit) 122/66 110/66 112/64     Heart Rate (Admit) 79 bpm 78 bpm 76 bpm     Heart Rate (Exercise) 100 bpm 93 bpm 101 bpm     Heart Rate (Exit) 85 bpm 87 bpm 81 bpm     Oxygen Saturation (Admit) 93 % -- --     Oxygen Saturation (Exercise) 91 % -- --     Rating of Perceived Exertion (Exercise) 17 13 11      Perceived Dyspnea (Exercise) 3 -- --     Symptoms SOB, chronic back pain 6/10 none none     Comments walk test results -- --     Duration --  Progress to 30 minutes of  aerobic without signs/symptoms of physical distress Progress to 30 minutes of  aerobic without signs/symptoms of physical distress     Intensity -- THRR unchanged THRR unchanged           Progression   Progression -- Continue to progress workloads to maintain intensity without signs/symptoms of physical distress. Continue to progress workloads to maintain intensity without signs/symptoms of physical distress.     Average METs -- 1.3 --  not reporting METs on XR and only 2 laps on track           Resistance Training   Training Prescription -- Yes Yes     Weight -- 3 lb 3 lb     Reps -- 10-15 10-15           Interval Training   Interval Training -- -- No           NuStep   Level -- 1 --     Minutes -- 15 --     METs -- 1.3 --           REL-XR   Level -- -- 1     Minutes -- -- 30            Exercise Comments:  Exercise Comments    Row Name 04/07/20 1343 04/21/20 1429         Exercise Comments First full day of exercise!  Patient was oriented to gym and equipment including functions, settings, policies, and procedures.  Patient's individual exercise prescription and treatment plan were reviewed.  All starting workloads were established based on the results of the 6 minute walk test done at initial orientation visit.  The plan for exercise progression was also introduced and progression will be customized based on patient's performance and goals. While on the TM for less than 45 seconds, Darice got short of breath , then got the dry heaves. This lasted  For about 5 minutes and then she felt better. HR increased to 113 during this time. She stated that this is normal for her and that there is not any known trigger. It just happens.  Skin warm and dry, BP and HR in good range. She went home escorted by her aide.             Exercise Goals and Review:  Exercise Goals    Row Name 03/24/20 1415             Exercise Goals   Increase Physical  Activity Yes       Intervention Provide advice, education, support and counseling about physical activity/exercise needs.;Develop an individualized exercise prescription for aerobic and resistive training based on initial evaluation findings, risk stratification, comorbidities and participant's personal goals.       Expected Outcomes Short  Term: Attend rehab on a regular basis to increase amount of physical activity.;Long Term: Exercising regularly at least 3-5 days a week.;Long Term: Add in home exercise to make exercise part of routine and to increase amount of physical activity.       Increase Strength and Stamina Yes       Intervention Provide advice, education, support and counseling about physical activity/exercise needs.;Develop an individualized exercise prescription for aerobic and resistive training based on initial evaluation findings, risk stratification, comorbidities and participant's personal goals.       Expected Outcomes Short Term: Increase workloads from initial exercise prescription for resistance, speed, and METs.;Short Term: Perform resistance training exercises routinely during rehab and add in resistance training at home;Long Term: Improve cardiorespiratory fitness, muscular endurance and strength as measured by increased METs and functional capacity (6MWT)       Able to understand and use rate of perceived exertion (RPE) scale Yes       Intervention Provide education and explanation on how to use RPE scale       Expected Outcomes Short Term: Able to use RPE daily in rehab to express subjective intensity level;Long Term:  Able to use RPE to guide intensity level when exercising independently       Able to understand and use Dyspnea scale Yes       Intervention Provide education and explanation on how to use Dyspnea scale       Expected Outcomes Short Term: Able to use Dyspnea scale daily in rehab to express subjective sense of shortness of breath during exertion;Long Term: Able to  use Dyspnea scale to guide intensity level when exercising independently       Knowledge and understanding of Target Heart Rate Range (THRR) Yes       Intervention Provide education and explanation of THRR including how the numbers were predicted and where they are located for reference       Expected Outcomes Short Term: Able to state/look up THRR;Short Term: Able to use daily as guideline for intensity in rehab;Long Term: Able to use THRR to govern intensity when exercising independently       Able to check pulse independently Yes       Intervention Provide education and demonstration on how to check pulse in carotid and radial arteries.;Review the importance of being able to check your own pulse for safety during independent exercise       Expected Outcomes Short Term: Able to explain why pulse checking is important during independent exercise;Long Term: Able to check pulse independently and accurately       Understanding of Exercise Prescription Yes       Intervention Provide education, explanation, and written materials on patient's individual exercise prescription       Expected Outcomes Short Term: Able to explain program exercise prescription;Long Term: Able to explain home exercise prescription to exercise independently              Exercise Goals Re-Evaluation :  Exercise Goals Re-Evaluation    Row Name 04/07/20 1343 04/14/20 1351 04/29/20 1238 05/12/20 1346       Exercise Goal Re-Evaluation   Exercise Goals Review Increase Physical Activity;Able to understand and use rate of perceived exertion (RPE) scale;Knowledge and understanding of Target Heart Rate Range (THRR);Understanding of Exercise Prescription;Increase Strength and Stamina;Able to understand and use Dyspnea scale;Able to check pulse independently Increase Physical Activity;Increase Strength and Stamina;Able to understand and use rate of perceived exertion (RPE) scale;Knowledge and understanding of  Target Heart Rate Range  (THRR);Understanding of Exercise Prescription Increase Physical Activity;Increase Strength and Stamina Increase Physical Activity;Increase Strength and Stamina    Comments Reviewed RPE and dyspnea scales, THR and program prescription with pt today.  Pt voiced understanding and was given a copy of goals to take home. Discussed patient progress with her exercise. She feels like she is starting to get stronger but stated that her initial exercise workloads Motyka feel challenging. Discussed with patient how to properly procress with workload intensity when she gets stronger and current workloads are no longer challenging. Srinidhi has tried several machines in her first sessions.  Staff will encourage her to work towards increasing workloads. Gara can tell a difference in her stamina and flexability. When she is done with the program she wants to workout at home. She has yet to get home exercise with the EPs. Informed her what to ask for and exercises that she is interested when they do her home exercise.    Expected Outcomes Short: Use RPE daily to regulate intensity. Long: Follow program prescription in THR. Short: evaluate workloads in the next week or 2 and progress with intensity if appropriate at that time. Long: Become indepndent with exercise program to continue to control cardiac risk factors. Short: attend consistently Long: follow program prescription Short: go over home exercise. Long: exercise at home independently           Discharge Exercise Prescription (Final Exercise Prescription Changes):  Exercise Prescription Changes - 05/13/20 1400      Response to Exercise   Blood Pressure (Admit) 124/62    Blood Pressure (Exercise) 120/60    Blood Pressure (Exit) 112/64    Heart Rate (Admit) 76 bpm    Heart Rate (Exercise) 101 bpm    Heart Rate (Exit) 81 bpm    Rating of Perceived Exertion (Exercise) 11    Symptoms none    Duration Progress to 30 minutes of  aerobic without signs/symptoms of  physical distress    Intensity THRR unchanged      Progression   Progression Continue to progress workloads to maintain intensity without signs/symptoms of physical distress.    Average METs --   not reporting METs on XR and only 2 laps on track     Resistance Training   Training Prescription Yes    Weight 3 lb    Reps 10-15      Interval Training   Interval Training No      REL-XR   Level 1    Minutes 30           Nutrition:  Target Goals: Understanding of nutrition guidelines, daily intake of sodium 1500mg , cholesterol 200mg , calories 30% from fat and 7% or less from saturated fats, daily to have 5 or more servings of fruits and vegetables.  Education: All About Nutrition: -Group instruction provided by verbal, written material, interactive activities, discussions, models, and posters to present general guidelines for heart healthy nutrition including fat, fiber, MyPlate, the role of sodium in heart healthy nutrition, utilization of the nutrition label, and utilization of this knowledge for meal planning. Follow up email sent as well. Written material given at graduation. Flowsheet Row Cardiac Rehab from 03/24/2020 in [facility] Cardiac and Pulmonary Rehab  Education need identified 03/24/20      Biometrics:  Pre Biometrics - 03/24/20 1416      Pre Biometrics   Height 5' 3.9" (1.623 m)    Weight 168 lb 11.2 oz (76.5 kg)  BMI (Calculated) 29.05    Single Leg Stand 3.2 seconds            Nutrition Therapy Plan and Nutrition Goals:  Nutrition Therapy & Goals - 04/09/20 1606      Nutrition Therapy   Diet Heart healthy, low Na    Protein (specify units) 60g    Fiber 25 grams    Whole Grain Foods 3 servings    Saturated Fats 12 max. grams    Fruits and Vegetables 8 servings/day    Sodium 1.5 grams      Personal Nutrition Goals   Nutrition Goal ST: Eat a rainbow of vegetables with dinner, have either starchy vegetables or grains with dinner as well LT: meet  nutritional needs with one meal per day    Comments Janon reports that she lost her appetite since her heart attack. Hasn't eaten anythign today - now 4pm. She enjoys fish and roasted chicken. 1 meal per day - brussells sprouts or green beans or another kind of green vegetable. Saute most of her foods - olive oil. She has whole wheat bread as a sandwich - chicken sandwich.  She cooks with very little salt - she has cut back. Drinks: water and milk (chocolate or whole milk). She was always eating one meal, but portions have cut down due to appetite. Similar diet to that before the heart attack. Weight stable - within 5 pounds. She has low energy since the heart attack, does not change with eating. Discussed heart healthy eating.      Intervention Plan   Intervention Nutrition handout(s) given to patient.;Prescribe, educate and counsel regarding individualized specific dietary modifications aiming towards targeted core components such as weight, hypertension, lipid management, diabetes, heart failure and other comorbidities.    Expected Outcomes Short Term Goal: Understand basic principles of dietary content, such as calories, fat, sodium, cholesterol and nutrients.;Short Term Goal: A plan has been developed with personal nutrition goals set during dietitian appointment.;Long Term Goal: Adherence to prescribed nutrition plan.           Nutrition Assessments:  MEDIFICTS Score Key:  ?70 Need to make dietary changes   40-70 Heart Healthy Diet  ? 40 Therapeutic Level Cholesterol Diet  Flowsheet Row Cardiac Rehab from 03/24/2020 in [facility] Cardiac and Pulmonary Rehab  Picture Your Plate Total Score on Admission 74     Picture Your Plate Scores:  [id number] Unhealthy dietary pattern with much room for improvement.  41-50 Dietary pattern unlikely to meet recommendations for good health and room for improvement.  51-60 More healthful dietary pattern, with some room for improvement.   >60 Healthy dietary  pattern, although there may be some specific behaviors that could be improved.    Nutrition Goals Re-Evaluation:  Nutrition Goals Re-Evaluation    [place] Name 04/14/20 1359 05/12/20 1351           Goals   Current Weight -- 164 lb (74.4 kg)      Nutrition Goal -- Continue to ear smaller meals.      Comment Spoke with program dietician on 4/6 to set initial goals. Royann eats smaller meals at a time. She states she has been eating more vegetables. Her weight has been doing well and wants to lose a little more.      Expected Outcome Short: work towards goals set by dietician at recent meeting. Long: adapt to heart healthy nutrician plan to help control cardiac risk factors. Short: continue to eat small meals. Long: lose more  weight.             Nutrition Goals Discharge (Final Nutrition Goals Re-Evaluation):  Nutrition Goals Re-Evaluation - 05/12/20 1351      Goals   Current Weight 164 lb (74.4 kg)    Nutrition Goal Continue to ear smaller meals.    Comment Analeya eats smaller meals at a time. She states she has been eating more vegetables. Her weight has been doing well and wants to lose a little more.    Expected Outcome Short: continue to eat small meals. Long: lose more weight.           Psychosocial: Target Goals: Acknowledge presence or absence of significant depression and/or stress, maximize coping skills, provide positive support system. Participant is able to verbalize types and ability to use techniques and skills needed for reducing stress and depression.   Education: Stress, Anxiety, and Depression - Group verbal and visual presentation to define topics covered.  Reviews how body is impacted by stress, anxiety, and depression.  Also discusses healthy ways to reduce stress and to treat/manage anxiety and depression.  Written material given at graduation.   Education: Sleep Hygiene -Provides group verbal and written instruction about how sleep can affect your health.   Define sleep hygiene, discuss sleep cycles and impact of sleep habits. Review good sleep hygiene tips.    Initial Review & Psychosocial Screening:  Initial Psych Review & Screening - 03/17/20 1413      Initial Review   Current issues with Current Anxiety/Panic;Current Stress Concerns    Source of Stress Concerns Chronic Illness      Family Dynamics   Good Support System? Yes   sister     Barriers   Psychosocial barriers to participate in program There are no identifiable barriers or psychosocial needs.;The patient should benefit from training in stress management and relaxation.      Screening Interventions   Interventions To provide support and resources with identified psychosocial needs;Encouraged to exercise;Provide feedback about the scores to participant    Expected Outcomes Short Term goal: Utilizing psychosocial counselor, staff and physician to assist with identification of specific Stressors or current issues interfering with healing process. Setting desired goal for each stressor or current issue identified.;Long Term Goal: Stressors or current issues are controlled or eliminated.;Short Term goal: Identification and review with participant of any Quality of Life or Depression concerns found by scoring the questionnaire.;Long Term goal: The participant improves quality of Life and PHQ9 Scores as seen by post scores and/or verbalization of changes           Quality of Life Scores:   Quality of Life - 03/24/20 1416      Quality of Life   Select Quality of Life      Quality of Life Scores   Health/Function Pre 9.97 %    Socioeconomic Pre 30 %    Psych/Spiritual Pre 15.43 %    Family Pre 27.6 %    GLOBAL Pre 17.81 %          Scores of 19 and below usually indicate a poorer quality of life in these areas.  A difference of  2-3 points is a clinically meaningful difference.  A difference of 2-3 points in the total score of the Quality of Life Index has been associated with  significant improvement in overall quality of life, self-image, physical symptoms, and general health in studies assessing change in quality of life.  PHQ-9: Recent Review Flowsheet Data  Depression screen [facility] 2/9 05/12/2020 03/24/2020 02/18/2020 08/23/2019 08/22/2018   Decreased Interest 0 1 0 0 0   Down, Depressed, Hopeless 0 1 0 0 0   PHQ - 2 Score 0 2 0 0 0   Altered sleeping 1 0 1 - -   Tired, decreased energy 1 3 3  - -   Change in appetite 0 2 2 - -   Feeling bad or failure about yourself  1 1 0 - -   Trouble concentrating 0 0 0 - -   Moving slowly or fidgety/restless 0 0 0 - -   Suicidal thoughts 0 0 0 - -   PHQ-9 Score 3 8 6  - -   Difficult doing work/chores Not difficult at all Somewhat difficult Not difficult at all - -     Interpretation of Total Score  Total Score Depression Severity:  1-4 = Minimal depression, 5-9 = Mild depression, 10-14 = Moderate depression, 15-19 = Moderately severe depression, 20-27 = Severe depression   Psychosocial Evaluation and Intervention:  Psychosocial Evaluation - 03/17/20 1430      Psychosocial Evaluation & Interventions   Interventions Stress management education;Relaxation education;Encouraged to exercise with the program and follow exercise prescription    Comments Ms. Njie reports doing okay post MI. She Acoff is having issues sleeping, some of it related to anxiety from her CPAP mask. She doesn't report any pain, but increased weakness. Her sister is her main support system and helps her daily. She is looking forward to coming to Cardiac Rehab for education and exercise to boost her stamina and strength. She does struggle with a decreased appetite which is new post MI.    Expected Outcomes Short: attend cardiac rehab for education and exercise. Long: develop and maintain positive self care    Continue Psychosocial Services  Follow up required by staff           Psychosocial Re-Evaluation:  Psychosocial Re-Evaluation    Dawson Name  04/14/20 1401 05/12/20 1358           Psychosocial Re-Evaluation   Current issues with Current Anxiety/Panic;Current Stress Concerns Current Psychotropic Meds      Comments Carrolyn report no new stress or sleep concerns. She reports that she continues to have a strong support system and is sleeping better. She has had a smaller appitite after her MI. We discussed that improtance of meeting her caloric needs even though her appitite is not a big. Reviewed patient health questionnaire (PHQ-9) with patient for follow up. Previously, patients score indicated signs/symptoms of depression.  Reviewed to see if patient is improving symptom wise while in program.  Score improved and patient states that it is because she has been able to exercise and feels like her health is back under control. Her health care worker come to her house three times a week and is not by herself so much.      Expected Outcomes Short: continue healthy sleeping pattern. Long: maintain good mental health and stress management. Short: Continue to attend [facility] regularly for regular exercise and social engagement. Long: Continue to improve symptoms and manage a positive mental state.      Interventions Encouraged to attend Cardiac Rehabilitation for the exercise Encouraged to attend Cardiac Rehabilitation for the exercise      Continue Psychosocial Services  Follow up required by staff No Follow up required             Psychosocial Discharge (Final Psychosocial Re-Evaluation):  Psychosocial Re-Evaluation -  05/12/20 1358      Psychosocial Re-Evaluation   Current issues with Current Psychotropic Meds    Comments Reviewed patient health questionnaire (PHQ-9) with patient for follow up. Previously, patients score indicated signs/symptoms of depression.  Reviewed to see if patient is improving symptom wise while in program.  Score improved and patient states that it is because she has been able to exercise and feels like her health  is back under control. Her health care worker come to her house three times a week and is not by herself so much.    Expected Outcomes Short: Continue to attend [facility] regularly for regular exercise and social engagement. Long: Continue to improve symptoms and manage a positive mental state.    Interventions Encouraged to attend Cardiac Rehabilitation for the exercise    Continue Psychosocial Services  No Follow up required           Vocational Rehabilitation: Provide vocational rehab assistance to qualifying candidates.   Vocational Rehab Evaluation & Intervention:  Vocational Rehab - 03/17/20 1413      Initial Vocational Rehab Evaluation & Intervention   Assessment shows need for Vocational Rehabilitation No           Education: Education Goals: Education classes will be provided on a variety of topics geared toward better understanding of heart health and risk factor modification. Participant will state understanding/return demonstration of topics presented as noted by education test scores.  Learning Barriers/Preferences:  Learning Barriers/Preferences - 03/17/20 1413      Learning Barriers/Preferences   Learning Barriers None    Learning Preferences None           General Cardiac Education Topics:  AED/CPR: - Group verbal and written instruction with the use of models to demonstrate the basic use of the AED with the basic ABC's of resuscitation.   Anatomy and Cardiac Procedures: - Group verbal and visual presentation and models provide information about basic cardiac anatomy and function. Reviews the testing methods done to diagnose heart disease and the outcomes of the test results. Describes the treatment choices: Medical Management, Angioplasty, or Coronary Bypass Surgery for treating various heart conditions including Myocardial Infarction, Angina, Valve Disease, and Cardiac Arrhythmias.  Written material given at graduation. Flowsheet Row Cardiac Rehab from  03/24/2020 in [facility] Cardiac and Pulmonary Rehab  Education need identified 03/24/20      Medication Safety: - Group verbal and visual instruction to review commonly prescribed medications for heart and lung disease. Reviews the medication, class of the drug, and side effects. Includes the steps to properly store meds and maintain the prescription regimen.  Written material given at graduation.   Intimacy: - Group verbal instruction through game format to discuss how heart and lung disease can affect sexual intimacy. Written material given at graduation..   Know Your Numbers and Heart Failure: - Group verbal and visual instruction to discuss disease risk factors for cardiac and pulmonary disease and treatment options.  Reviews associated critical values for Overweight/Obesity, Hypertension, Cholesterol, and Diabetes.  Discusses basics of heart failure: signs/symptoms and treatments.  Introduces Heart Failure Zone chart for action plan for heart failure.  Written material given at graduation.   Infection Prevention: - Provides verbal and written material to individual with discussion of infection control including proper hand washing and proper equipment cleaning during exercise session. Flowsheet Row Cardiac Rehab from 03/24/2020 in [facility] Cardiac and Pulmonary Rehab  Date 03/24/20  Educator [facility]  Instruction Review Code 1- Verbalizes Understanding  Falls Prevention: - Provides verbal and written material to individual with discussion of falls prevention and safety. Flowsheet Row Cardiac Rehab from 03/24/2020 in [facility] Cardiac and Pulmonary Rehab  Date 03/24/20  Educator [facility]  Instruction Review Code 1- Verbalizes Understanding      Other: -Provides group and verbal instruction on various topics (see comments)   Knowledge Questionnaire Score:  Knowledge Questionnaire Score - 03/24/20 1419      Knowledge Questionnaire Score   Pre Score 23/26 Education Focus: angina, nurtition,  exercise           Core Components/Risk Factors/Patient Goals at Admission:  Personal Goals and Risk Factors at Admission - 03/24/20 1420      Core Components/Risk Factors/Patient Goals on Admission    Weight Management Yes;Weight Loss;Weight Maintenance    Intervention Weight Management: Develop a combined nutrition and exercise program designed to reach desired caloric intake, while maintaining appropriate intake of nutrient and fiber, sodium and fats, and appropriate energy expenditure required for the weight goal.;Weight Management: Provide education and appropriate resources to help participant work on and attain dietary goals.;Weight Management/Obesity: Establish reasonable short term and long term weight goals.    Admit Weight 168 lb 11.2 oz (76.5 kg)    Goal Weight: Short Term 165 lb (74.8 kg)    Goal Weight: Long Term 165 lb (74.8 kg)    Expected Outcomes Long Term: Adherence to nutrition and physical activity/exercise program aimed toward attainment of established weight goal;Short Term: Continue to assess and modify interventions until short term weight is achieved;Weight Maintenance: Understanding of the daily nutrition guidelines, which includes 25-35% calories from fat, 7% or less cal from saturated fats, less than 200mg  cholesterol, less than 1.5gm of sodium, & 5 or more servings of fruits and vegetables daily;Understanding recommendations for meals to include 15-35% energy as protein, 25-35% energy from fat, 35-60% energy from carbohydrates, less than 200mg  of dietary cholesterol, 20-35 gm of total fiber daily;Weight Loss: Understanding of general recommendations for a balanced deficit meal plan, which promotes 1-2 lb weight loss per week and includes a negative energy balance of [phone] kcal/d;Understanding of distribution of calorie intake throughout the day with the consumption of 4-5 meals/snacks    Heart Failure Yes    Intervention Provide a combined exercise and nutrition  program that is supplemented with education, support and counseling about heart failure. Directed toward relieving symptoms such as shortness of breath, decreased exercise tolerance, and extremity edema.    Expected Outcomes Improve functional capacity of life;Short term: Attendance in program 2-3 days a week with increased exercise capacity. Reported lower sodium intake. Reported increased fruit and vegetable intake. Reports medication compliance.;Short term: Daily weights obtained and reported for increase. Utilizing diuretic protocols set by physician.;Long term: Adoption of self-care skills and reduction of barriers for early signs and symptoms recognition and intervention leading to self-care maintenance.    Hypertension Yes    Intervention Provide education on lifestyle modifcations including regular physical activity/exercise, weight management, moderate sodium restriction and increased consumption of fresh fruit, vegetables, and low fat dairy, alcohol moderation, and smoking cessation.;Monitor prescription use compliance.    Expected Outcomes Short Term: Continued assessment and intervention until BP is < 140/66mm HG in hypertensive participants. < 130/84mm HG in hypertensive participants with diabetes, heart failure or chronic kidney disease.;Long Term: Maintenance of blood pressure at goal levels.    Lipids Yes    Intervention Provide education and support for participant on nutrition & aerobic/resistive exercise along with prescribed medications to achieve  LDL 70mg , HDL >40mg .    Expected Outcomes Short Term: Participant states understanding of desired cholesterol values and is compliant with medications prescribed. Participant is following exercise prescription and nutrition guidelines.;Long Term: Cholesterol controlled with medications as prescribed, with individualized exercise RX and with personalized nutrition plan. Value goals: LDL < 70mg , HDL > 40 mg.           Education:Diabetes -  Individual verbal and written instruction to review signs/symptoms of diabetes, desired ranges of glucose level fasting, after meals and with exercise. Acknowledge that pre and post exercise glucose checks will be done for 3 sessions at entry of program.   Core Components/Risk Factors/Patient Goals Review:   Goals and Risk Factor Review    Row Name 04/14/20 1355 05/12/20 1349           Core Components/Risk Factors/Patient Goals Review   Personal Goals Review Weight Management/Obesity;Hypertension;Heart Failure;Lipids Weight Management/Obesity;Hypertension;Heart Failure      Review Patient reports that she takes all medications at prescribed. She weighs every morning and keeps track to her weights. She reports that her weight has been consistent and knows the proticol of what to do when there are major changes in weight or heart failure symptoms. Sonnia is checkin her blood pressure at home and states that it is very close to what we are monitoring in Rehab. She would like to lose a little more weight. She wantst to reach a weight goal of 135-130lbs.      Expected Outcomes Short: continue to attend cardiac rehab consistently and continue to weigh every day. Long: adapt to a heart healthy lifestyle to control cardiac risk factors. Short: lose more weight. Long: reach her weight goal.             Core Components/Risk Factors/Patient Goals at Discharge (Final Review):   Goals and Risk Factor Review - 05/12/20 1349      Core Components/Risk Factors/Patient Goals Review   Personal Goals Review Weight Management/Obesity;Hypertension;Heart Failure    Review Emia is checkin her blood pressure at home and states that it is very close to what we are monitoring in Rehab. She would like to lose a little more weight. She wantst to reach a weight goal of 135-130lbs.    Expected Outcomes Short: lose more weight. Long: reach her weight goal.           ITP Comments:  ITP Comments    Row Name  03/17/20 1421 03/24/20 1409 03/26/20 1124 04/07/20 1342 04/09/20 1631   ITP Comments Initial telelphone orientation completed. Diagnosis can be found in [facility] 2/8. EP orientation scheduled for Monday 3/21 at 10am. Completed 6MWT and gym orientation. Initial ITP created and sent for review to Dr. Emily Filbert, Medical Director. 30 Day review completed. Medical Director ITP review done, changes made as directed, and signed approval by Medical Director.  New to program First full day of exercise!  Patient was oriented to gym and equipment including functions, settings, policies, and procedures.  Patient's individual exercise prescription and treatment plan were reviewed.  All starting workloads were established based on the results of the 6 minute walk test done at initial orientation visit.  The plan for exercise progression was also introduced and progression will be customized based on patient's performance and goals. Completed initial RD evaluation   Row Name 04/21/20 1429 04/23/20 0638 05/21/20 0957       ITP Comments While on the TM for less than 45 seconds, Hyla got short of breath , then  got the dry heaves. This lasted  For about 5 minutes and then she felt better. HR increased to 113 during this time. She stated that this is normal for her and that there is not any known trigger. It just happens.  Skin warm and dry, BP and HR in good range. She went home escorted by her aide. 30 Day review completed. Medical Director ITP review done, changes made as directed, and signed approval by Medical Director. 30 Day review completed. Medical Director ITP review done, changes made as directed, and signed approval by Medical Director.            Comments:

## 2020-05-27 DIAGNOSIS — I213 ST elevation (STEMI) myocardial infarction of unspecified site: Secondary | ICD-10-CM

## 2020-05-27 DIAGNOSIS — I2510 Atherosclerotic heart disease of native coronary artery without angina pectoris: Secondary | ICD-10-CM | POA: Diagnosis not present

## 2020-05-27 DIAGNOSIS — I10 Essential (primary) hypertension: Secondary | ICD-10-CM | POA: Diagnosis not present

## 2020-05-27 DIAGNOSIS — E782 Mixed hyperlipidemia: Secondary | ICD-10-CM | POA: Diagnosis not present

## 2020-05-27 DIAGNOSIS — I482 Chronic atrial fibrillation, unspecified: Secondary | ICD-10-CM | POA: Diagnosis not present

## 2020-05-27 DIAGNOSIS — I6523 Occlusion and stenosis of bilateral carotid arteries: Secondary | ICD-10-CM | POA: Diagnosis not present

## 2020-05-27 NOTE — Progress Notes (Signed)
Called Kayla Maxwell as she has not been to rehab since 5/9. [name] reports being out due to other things going on like needing her teeth pulled - she was at the MD office and will call back to discuss attendance.

## 2020-05-29 ENCOUNTER — Other Ambulatory Visit: Payer: Self-pay | Admitting: Family Medicine

## 2020-05-29 DIAGNOSIS — I70222 Atherosclerosis of native arteries of extremities with rest pain, left leg: Secondary | ICD-10-CM

## 2020-05-29 NOTE — Telephone Encounter (Signed)
Requested medication (s) are due for refill today: Yes  Requested medication (s) are on the active medication list: Yes  Last refill:  04/23/20  Future visit scheduled: No  Notes to clinic:  See request.    Requested Prescriptions  Pending Prescriptions Disp Refills   HYDROcodone-acetaminophen (NORCO) 5-325 MG tablet 90 tablet 0    Sig: Take 1-2 tablets by mouth every 8 (eight) hours as needed for moderate pain or severe pain.      Not Delegated - Analgesics:  Opioid Agonist Combinations Failed - 05/29/2020  2:37 PM      Failed - This refill cannot be delegated      Failed - Urine Drug Screen completed in last 360 days      Passed - Valid encounter within last 6 months    Recent Outpatient Visits           3 months ago Nasal congestion   [place], [place]   3 months ago ST elevation myocardial infarction involving left anterior descending (LAD) coronary artery [facility])   [facility] [place], [place], [place]   9 months ago Diverticulitis   [place], [place]   9 months ago Leukocytosis, unspecified type   [facility], [place], [place]   1 year ago Benign essential HTN   [facility] [place], [place], [place]

## 2020-05-29 NOTE — Telephone Encounter (Signed)
Medication Refill - Medication:   HYDROcodone-acetaminophen (NORCO) 5-325 MG tablet    Has the patient contacted their pharmacy? Yes.  needs doctor approval for refills.    Preferred Pharmacy (with phone number or street name):   [id number] - [place], [place]  [address] [place] [address]  Phone:  [phone] Fax:  [phone]   Agent: Please be advised that RX refills may take up to 3 business days. We ask that you follow-up with your pharmacy.

## 2020-05-30 MED ORDER — HYDROCODONE-ACETAMINOPHEN 5-325 MG PO TABS: 1.0000 | ORAL_TABLET | Freq: Three times a day (TID) | ORAL | 0 refills | Status: DC | PRN

## 2020-06-06 DIAGNOSIS — I213 ST elevation (STEMI) myocardial infarction of unspecified site: Secondary | ICD-10-CM

## 2020-06-06 NOTE — Progress Notes (Signed)
Cardiac Individual Treatment Plan  Patient Details  Name: Kayla Maxwell MRN: [id number] Date of Birth: 03-Feb-1945 Referring Provider:   Flowsheet Row Cardiac Rehab from 03/24/2020 in [facility] Cardiac and Pulmonary Rehab  Referring Provider Serafina Royals  MD      Initial Encounter Date:  Flowsheet Row Cardiac Rehab from 03/24/2020 in [facility] Cardiac and Pulmonary Rehab  Date 03/24/20      Visit Diagnosis: ST elevation myocardial infarction (STEMI), unspecified artery ([place])  Patient's Home Medications on Admission:  Current Outpatient Medications:  .  albuterol (PROVENTIL HFA;VENTOLIN HFA) 108 (90 Base) MCG/ACT inhaler, Inhale 2 puffs into the lungs every 6 (six) hours as needed for wheezing or shortness of breath., Disp: 1 Inhaler, Rfl: 5 .  aspirin EC 81 MG EC tablet, Take 1 tablet (81 mg total) by mouth daily., Disp: 30 tablet, Rfl: 1 .  dapagliflozin propanediol (FARXIGA) 5 MG TABS tablet, Take 1 tablet (5 mg total) by mouth daily., Disp: 30 tablet, Rfl: 2 .  FLUoxetine (PROZAC) 20 MG capsule, TAKE 1 CAPSULE BY MOUTH EVERY DAY, Disp: 90 capsule, Rfl: 1 .  furosemide (LASIX) 20 MG tablet, Take 1 tablet (20 mg total) by mouth daily as needed for fluid or edema., Disp: 30 tablet, Rfl: 2 .  gabapentin (NEURONTIN) 300 MG capsule, TAKE 1 CAPSULE BY MOUTH EVERYDAY AT BEDTIME (Patient taking differently: Take 300 mg by mouth at bedtime.), Disp: 30 capsule, Rfl: 3 .  HYDROcodone-acetaminophen (NORCO) 5-325 MG tablet, Take 1-2 tablets by mouth every 8 (eight) hours as needed for moderate pain or severe pain., Disp: 90 tablet, Rfl: 0 .  lisinopril (ZESTRIL) 2.5 MG tablet, TAKE 1 TABLET BY MOUTH EVERY DAY, Disp: 90 tablet, Rfl: 1 .  metoprolol tartrate (LOPRESSOR) 25 MG tablet, TAKE 2 TABLETS BY MOUTH TWICE A DAY, Disp: 360 tablet, Rfl: 1 .  midodrine (PROAMATINE) 2.5 MG tablet, Take 1 tablet (2.5 mg total) by mouth 3 (three) times daily with meals., Disp: 90 tablet, Rfl: 1 .  nitroGLYCERIN  (NITROSTAT) 0.4 MG SL tablet, Place 1 tablet (0.4 mg total) under the tongue every 5 (five) minutes x 3 doses as needed for chest pain., Disp: 30 tablet, Rfl: 12 .  omeprazole (PRILOSEC) 40 MG capsule, TAKE 1 CAPSULE BY MOUTH EVERY DAY, Disp: 90 capsule, Rfl: 1 .  ondansetron (ZOFRAN ODT) 4 MG disintegrating tablet, Take 1 tablet (4 mg total) by mouth every 8 (eight) hours as needed., Disp: 20 tablet, Rfl: 0 .  rivaroxaban (XARELTO) 20 MG TABS tablet, Take 20 mg by mouth daily with supper. , Disp: , Rfl:  .  rosuvastatin (CRESTOR) 20 MG tablet, Take 1 tablet (20 mg total) by mouth daily., Disp: 90 tablet, Rfl: 1 .  ticagrelor (BRILINTA) 90 MG TABS tablet, Take 1 tablet (90 mg total) by mouth 2 (two) times daily., Disp: 60 tablet, Rfl: 11  Past Medical History: Past Medical History:  Diagnosis Date  . Acid reflux    takes Nexium daily  . Allergy    takes Singulair daily as needed  . Anemia   . Atrial fibrillation ([place])   . Cancer ([place]) AGE 8   CERVICAL cancer with removal  . CHF (congestive heart failure) ([place])   . Chronic back pain    stenosis  . Complication of anesthesia   . Constipation    takes Colace daily as needed  . Coronary artery disease   . Difficult intubation   . Dysrhythmia    atrial fibrillation dx 04/2014  .  Headache    sinus   . History of blood transfusion   . History of shingles   . Hyperlipidemia    takes Pravastatin daily-started taking this 6 months ago  . Hypertension   . Lumbar surgical wound fluid collection   . Sleep apnea    CPAP  . Stroke ([place])    TIA'S X 2 --- one last yr and one this yr...lost her memory over 4-6 hrs.  . Stroke ([place]) 06-04-15   left with right side weakness  . TIA (transient ischemic attack)    showed up on an MRI but she never knew it  . Urinary frequency     Tobacco Use: Social History   Tobacco Use  Smoking Status Former Smoker  . Packs/day: 1.00  . Years: 47.00  . Pack years: 47.00  . Quit date: 08/19/2014  .  Years since quitting: 5.8  Smokeless Tobacco Former Systems developer  . Quit date: 08/19/2014    Labs: Recent Review Flowsheet Data    Labs for ITP Cardiac and Pulmonary Rehab Latest Ref Rng & Units 04/11/2015 06/06/2015 06/30/2015 04/04/2017 02/13/2020   Cholestrol 0 - 200 mg/dL - - 138 134 106   LDLCALC 0 - 99 mg/dL - - 47 50 53   HDL >40 mg/dL - - 55 51 36(L)   Trlycerides <150 mg/dL - - 180(H) 165(H) 87   Hemoglobin A1c 4.8 - 5.6 % 5.8 5.8 - 6.0(H) -       Exercise Target Goals: Exercise Program Goal: Individual exercise prescription set using results from initial 6 min walk test and THRR while considering  patient's activity barriers and safety.   Exercise Prescription Goal: Initial exercise prescription builds to 30-45 minutes a day of aerobic activity, 2-3 days per week.  Home exercise guidelines will be given to patient during program as part of exercise prescription that the participant will acknowledge.   Education: Aerobic Exercise: - Group verbal and visual presentation on the components of exercise prescription. Introduces F.I.T.T principle from ACSM for exercise prescriptions.  Reviews F.I.T.T. principles of aerobic exercise including progression. Written material given at graduation. Flowsheet Row Cardiac Rehab from 03/24/2020 in [facility] Cardiac and Pulmonary Rehab  Education need identified 03/24/20      Education: Resistance Exercise: - Group verbal and visual presentation on the components of exercise prescription. Introduces F.I.T.T principle from ACSM for exercise prescriptions  Reviews F.I.T.T. principles of resistance exercise including progression. Written material given at graduation.    Education: Exercise & Equipment Safety: - Individual verbal instruction and demonstration of equipment use and safety with use of the equipment. Flowsheet Row Cardiac Rehab from 03/24/2020 in [facility] Cardiac and Pulmonary Rehab  Date 03/24/20  Educator [facility]  Instruction Review Code 1- Verbalizes  Understanding      Education: Exercise Physiology & General Exercise Guidelines: - Group verbal and written instruction with models to review the exercise physiology of the cardiovascular system and associated critical values. Provides general exercise guidelines with specific guidelines to those with heart or lung disease.    Education: Flexibility, Balance, Mind/Body Relaxation: - Group verbal and visual presentation with interactive activity on the components of exercise prescription. Introduces F.I.T.T principle from ACSM for exercise prescriptions. Reviews F.I.T.T. principles of flexibility and balance exercise training including progression. Also discusses the mind body connection.  Reviews various relaxation techniques to help reduce and manage stress (i.e. Deep breathing, progressive muscle relaxation, and visualization). Balance handout provided to take home. Written material given at graduation.   Activity  Barriers & Risk Stratification:  Activity Barriers & Cardiac Risk Stratification - 03/24/20 1410      Activity Barriers & Cardiac Risk Stratification   Activity Barriers Back Problems;Other (comment);Deconditioning;Muscular Weakness;Shortness of Breath;Balance Concerns    Comments hx of stent in groin, nerve pain, chronic back pain, and pain in feet    Cardiac Risk Stratification High           6 Minute Walk:  6 Minute Walk    Row Name 03/24/20 1409         6 Minute Walk   Phase Initial     Distance 400 feet     Walk Time 4.02 minutes     # of Rest Breaks 5  5 sec, 7 sec, 7 sec, 1:10, 8 sec, stopped at 5:42     MPH 1.13     METS 1.09     RPE 17     Perceived Dyspnea  3     VO2 Peak 3.84     Symptoms Yes (comment)     Comments SOB, chronic back pain 6/10     Resting HR 79 bpm     Resting BP 134/64     Resting Oxygen Saturation  93 %     Exercise Oxygen Saturation  during 6 min walk 91 %     Max Ex. HR 100 bpm     Max Ex. BP 148/82     2 Minute Post BP 122/66             Oxygen Initial Assessment:   Oxygen Re-Evaluation:   Oxygen Discharge (Final Oxygen Re-Evaluation):   Initial Exercise Prescription:  Initial Exercise Prescription - 03/24/20 1400      Date of Initial Exercise RX and Referring Provider   Date 03/24/20    Referring Provider Serafina Royals  MD      Treadmill   MPH 0.8    Grade 0    Minutes 5    METs 1.6      Recumbant Bike   Level 1    RPM 50    Watts 10    Minutes 15    METs 1.5      NuStep   Level 1    SPM 80    Minutes 15    METs 1.5      REL-XR   Level 1    Speed 50    Minutes 15    METs 1.5      Prescription Details   Frequency (times per week) 2    Duration Progress to 30 minutes of continuous aerobic without signs/symptoms of physical distress      Intensity   THRR 40-80% of Max Heartrate 106-133    Ratings of Perceived Exertion 11-13    Perceived Dyspnea 0-4      Progression   Progression Continue to progress workloads to maintain intensity without signs/symptoms of physical distress.      Resistance Training   Training Prescription Yes    Weight 3 lb    Reps 10-15           Perform Capillary Blood Glucose checks as needed.  Exercise Prescription Changes:  Exercise Prescription Changes    Row Name 03/24/20 1400 04/29/20 1200 05/13/20 1400         Response to Exercise   Blood Pressure (Admit) 134/64 122/72 124/62     Blood Pressure (Exercise) 148/82 112/64 120/60     Blood Pressure (Exit) 122/66 110/66 112/64  Heart Rate (Admit) 79 bpm 78 bpm 76 bpm     Heart Rate (Exercise) 100 bpm 93 bpm 101 bpm     Heart Rate (Exit) 85 bpm 87 bpm 81 bpm     Oxygen Saturation (Admit) 93 % -- --     Oxygen Saturation (Exercise) 91 % -- --     Rating of Perceived Exertion (Exercise) 17 13 11      Perceived Dyspnea (Exercise) 3 -- --     Symptoms SOB, chronic back pain 6/10 none none     Comments walk test results -- --     Duration -- Progress to 30 minutes of  aerobic without  signs/symptoms of physical distress Progress to 30 minutes of  aerobic without signs/symptoms of physical distress     Intensity -- THRR unchanged THRR unchanged           Progression   Progression -- Continue to progress workloads to maintain intensity without signs/symptoms of physical distress. Continue to progress workloads to maintain intensity without signs/symptoms of physical distress.     Average METs -- 1.3 --  not reporting METs on XR and only 2 laps on track           Resistance Training   Training Prescription -- Yes Yes     Weight -- 3 lb 3 lb     Reps -- 10-15 10-15           Interval Training   Interval Training -- -- No           NuStep   Level -- 1 --     Minutes -- 15 --     METs -- 1.3 --           REL-XR   Level -- -- 1     Minutes -- -- 30            Exercise Comments:  Exercise Comments    Row Name 04/07/20 1343 04/21/20 1429         Exercise Comments First full day of exercise!  Patient was oriented to gym and equipment including functions, settings, policies, and procedures.  Patient's individual exercise prescription and treatment plan were reviewed.  All starting workloads were established based on the results of the 6 minute walk test done at initial orientation visit.  The plan for exercise progression was also introduced and progression will be customized based on patient's performance and goals. While on the TM for less than 45 seconds, Kayla Maxwell got short of breath , then got the dry heaves. This lasted  For about 5 minutes and then she felt better. HR increased to 113 during this time. She stated that this is normal for her and that there is not any known trigger. It just happens.  Skin warm and dry, BP and HR in good range. She went home escorted by her aide.             Exercise Goals and Review:  Exercise Goals    Row Name 03/24/20 1415             Exercise Goals   Increase Physical Activity Yes       Intervention Provide advice,  education, support and counseling about physical activity/exercise needs.;Develop an individualized exercise prescription for aerobic and resistive training based on initial evaluation findings, risk stratification, comorbidities and participant's personal goals.       Expected Outcomes Short Term: Attend rehab on a regular basis to increase  amount of physical activity.;Long Term: Exercising regularly at least 3-5 days a week.;Long Term: Add in home exercise to make exercise part of routine and to increase amount of physical activity.       Increase Strength and Stamina Yes       Intervention Provide advice, education, support and counseling about physical activity/exercise needs.;Develop an individualized exercise prescription for aerobic and resistive training based on initial evaluation findings, risk stratification, comorbidities and participant's personal goals.       Expected Outcomes Short Term: Increase workloads from initial exercise prescription for resistance, speed, and METs.;Short Term: Perform resistance training exercises routinely during rehab and add in resistance training at home;Long Term: Improve cardiorespiratory fitness, muscular endurance and strength as measured by increased METs and functional capacity (6MWT)       Able to understand and use rate of perceived exertion (RPE) scale Yes       Intervention Provide education and explanation on how to use RPE scale       Expected Outcomes Short Term: Able to use RPE daily in rehab to express subjective intensity level;Long Term:  Able to use RPE to guide intensity level when exercising independently       Able to understand and use Dyspnea scale Yes       Intervention Provide education and explanation on how to use Dyspnea scale       Expected Outcomes Short Term: Able to use Dyspnea scale daily in rehab to express subjective sense of shortness of breath during exertion;Long Term: Able to use Dyspnea scale to guide intensity level when  exercising independently       Knowledge and understanding of Target Heart Rate Range (THRR) Yes       Intervention Provide education and explanation of THRR including how the numbers were predicted and where they are located for reference       Expected Outcomes Short Term: Able to state/look up THRR;Short Term: Able to use daily as guideline for intensity in rehab;Long Term: Able to use THRR to govern intensity when exercising independently       Able to check pulse independently Yes       Intervention Provide education and demonstration on how to check pulse in carotid and radial arteries.;Review the importance of being able to check your own pulse for safety during independent exercise       Expected Outcomes Short Term: Able to explain why pulse checking is important during independent exercise;Long Term: Able to check pulse independently and accurately       Understanding of Exercise Prescription Yes       Intervention Provide education, explanation, and written materials on patient's individual exercise prescription       Expected Outcomes Short Term: Able to explain program exercise prescription;Long Term: Able to explain home exercise prescription to exercise independently              Exercise Goals Re-Evaluation :  Exercise Goals Re-Evaluation    Row Name 04/07/20 1343 04/14/20 1351 04/29/20 1238 05/12/20 1346       Exercise Goal Re-Evaluation   Exercise Goals Review Increase Physical Activity;Able to understand and use rate of perceived exertion (RPE) scale;Knowledge and understanding of Target Heart Rate Range (THRR);Understanding of Exercise Prescription;Increase Strength and Stamina;Able to understand and use Dyspnea scale;Able to check pulse independently Increase Physical Activity;Increase Strength and Stamina;Able to understand and use rate of perceived exertion (RPE) scale;Knowledge and understanding of Target Heart Rate Range (THRR);Understanding of Exercise Prescription  Increase Physical Activity;Increase Strength and Stamina Increase Physical Activity;Increase Strength and Stamina    Comments Reviewed RPE and dyspnea scales, THR and program prescription with pt today.  Pt voiced understanding and was given a copy of goals to take home. Discussed patient progress with her exercise. She feels like she is starting to get stronger but stated that her initial exercise workloads Weant feel challenging. Discussed with patient how to properly procress with workload intensity when she gets stronger and current workloads are no longer challenging. Kayla Maxwell has tried several machines in her first sessions.  Staff will encourage her to work towards increasing workloads. Kayla Maxwell can tell a difference in her stamina and flexability. When she is done with the program she wants to workout at home. She has yet to get home exercise with the EPs. Informed her what to ask for and exercises that she is interested when they do her home exercise.    Expected Outcomes Short: Use RPE daily to regulate intensity. Long: Follow program prescription in THR. Short: evaluate workloads in the next week or 2 and progress with intensity if appropriate at that time. Long: Become indepndent with exercise program to continue to control cardiac risk factors. Short: attend consistently Long: follow program prescription Short: go over home exercise. Long: exercise at home independently           Discharge Exercise Prescription (Final Exercise Prescription Changes):  Exercise Prescription Changes - 05/13/20 1400      Response to Exercise   Blood Pressure (Admit) 124/62    Blood Pressure (Exercise) 120/60    Blood Pressure (Exit) 112/64    Heart Rate (Admit) 76 bpm    Heart Rate (Exercise) 101 bpm    Heart Rate (Exit) 81 bpm    Rating of Perceived Exertion (Exercise) 11    Symptoms none    Duration Progress to 30 minutes of  aerobic without signs/symptoms of physical distress    Intensity THRR unchanged       Progression   Progression Continue to progress workloads to maintain intensity without signs/symptoms of physical distress.    Average METs --   not reporting METs on XR and only 2 laps on track     Resistance Training   Training Prescription Yes    Weight 3 lb    Reps 10-15      Interval Training   Interval Training No      REL-XR   Level 1    Minutes 30           Nutrition:  Target Goals: Understanding of nutrition guidelines, daily intake of sodium 1500mg , cholesterol 200mg , calories 30% from fat and 7% or less from saturated fats, daily to have 5 or more servings of fruits and vegetables.  Education: All About Nutrition: -Group instruction provided by verbal, written material, interactive activities, discussions, models, and posters to present general guidelines for heart healthy nutrition including fat, fiber, MyPlate, the role of sodium in heart healthy nutrition, utilization of the nutrition label, and utilization of this knowledge for meal planning. Follow up email sent as well. Written material given at graduation. Flowsheet Row Cardiac Rehab from 03/24/2020 in [facility] Cardiac [facility]monary Rehab  Education need identified 03/24/20      Biometrics:  Pre Biometrics - 03/24/20 1416      Pre Biometrics   Height 5' 3.9" (1.623 m)    Weight 168 lb 11.2 oz (76.5 kg)    BMI (Calculated) 29.05    Single Leg Stand  3.2 seconds            Nutrition Therapy Plan and Nutrition Goals:  Nutrition Therapy & Goals - 04/09/20 1606      Nutrition Therapy   Diet Heart healthy, low Na    Protein (specify units) 60g    Fiber 25 grams    Whole Grain Foods 3 servings    Saturated Fats 12 max. grams    Fruits and Vegetables 8 servings/day    Sodium 1.5 grams      Personal Nutrition Goals   Nutrition Goal ST: Eat a rainbow of vegetables with dinner, have either starchy vegetables or grains with dinner as well LT: meet nutritional needs with one meal per day    Comments  Kayla Maxwell reports that she lost her appetite since her heart attack. Hasn't eaten anythign today - now 4pm. She enjoys fish and roasted chicken. 1 meal per day - brussells sprouts or green beans or another kind of green vegetable. Saute most of her foods - olive oil. She has whole wheat bread as a sandwich - chicken sandwich.  She cooks with very little salt - she has cut back. Drinks: water and milk (chocolate or whole milk). She was always eating one meal, but portions have cut down due to appetite. Similar diet to that before the heart attack. Weight stable - within 5 pounds. She has low energy since the heart attack, does not change with eating. Discussed heart healthy eating.      Intervention Plan   Intervention Nutrition handout(s) given to patient.;Prescribe, educate and counsel regarding individualized specific dietary modifications aiming towards targeted core components such as weight, hypertension, lipid management, diabetes, heart failure and other comorbidities.    Expected Outcomes Short Term Goal: Understand basic principles of dietary content, such as calories, fat, sodium, cholesterol and nutrients.;Short Term Goal: A plan has been developed with personal nutrition goals set during dietitian appointment.;Long Term Goal: Adherence to prescribed nutrition plan.           Nutrition Assessments:  MEDIFICTS Score Key:  ?70 Need to make dietary changes   40-70 Heart Healthy Diet  ? 40 Therapeutic Level Cholesterol Diet  Flowsheet Row Cardiac Rehab from 03/24/2020 in [facility] Cardiac and Pulmonary Rehab  Picture Your Plate Total Score on Admission 74     Picture Your Plate Scores:  <22 Unhealthy dietary pattern with much room for improvement.  41-50 Dietary pattern unlikely to meet recommendations for good health and room for improvement.  51-60 More healthful dietary pattern, with some room for improvement.   >60 Healthy dietary pattern, although there may be some specific  behaviors that could be improved.    Nutrition Goals Re-Evaluation:  Nutrition Goals Re-Evaluation    [place] Name 04/14/20 1359 05/12/20 1351           Goals   Current Weight -- 164 lb (74.4 kg)      Nutrition Goal -- Continue to ear smaller meals.      Comment Spoke with program dietician on 4/6 to set initial goals. Kayla Maxwell eats smaller meals at a time. She states she has been eating more vegetables. Her weight has been doing well and wants to lose a little more.      Expected Outcome Short: work towards goals set by dietician at recent meeting. Long: adapt to heart healthy nutrician plan to help control cardiac risk factors. Short: continue to eat small meals. Long: lose more weight.  Nutrition Goals Discharge (Final Nutrition Goals Re-Evaluation):  Nutrition Goals Re-Evaluation - 05/12/20 1351      Goals   Current Weight 164 lb (74.4 kg)    Nutrition Goal Continue to ear smaller meals.    Comment Kayla Maxwell eats smaller meals at a time. She states she has been eating more vegetables. Her weight has been doing well and wants to lose a little more.    Expected Outcome Short: continue to eat small meals. Long: lose more weight.           Psychosocial: Target Goals: Acknowledge presence or absence of significant depression and/or stress, maximize coping skills, provide positive support system. Participant is able to verbalize types and ability to use techniques and skills needed for reducing stress and depression.   Education: Stress, Anxiety, and Depression - Group verbal and visual presentation to define topics covered.  Reviews how body is impacted by stress, anxiety, and depression.  Also discusses healthy ways to reduce stress and to treat/manage anxiety and depression.  Written material given at graduation.   Education: Sleep Hygiene -Provides group verbal and written instruction about how sleep can affect your health.  Define sleep hygiene, discuss sleep cycles and  impact of sleep habits. Review good sleep hygiene tips.    Initial Review & Psychosocial Screening:  Initial Psych Review & Screening - 03/17/20 1413      Initial Review   Current issues with Current Anxiety/Panic;Current Stress Concerns    Source of Stress Concerns Chronic Illness      Family Dynamics   Good Support System? Yes   sister     Barriers   Psychosocial barriers to participate in program There are no identifiable barriers or psychosocial needs.;The patient should benefit from training in stress management and relaxation.      Screening Interventions   Interventions To provide support and resources with identified psychosocial needs;Encouraged to exercise;Provide feedback about the scores to participant    Expected Outcomes Short Term goal: Utilizing psychosocial counselor, staff and physician to assist with identification of specific Stressors or current issues interfering with healing process. Setting desired goal for each stressor or current issue identified.;Long Term Goal: Stressors or current issues are controlled or eliminated.;Short Term goal: Identification and review with participant of any Quality of Life or Depression concerns found by scoring the questionnaire.;Long Term goal: The participant improves quality of Life and PHQ9 Scores as seen by post scores and/or verbalization of changes           Quality of Life Scores:   Quality of Life - 03/24/20 1416      Quality of Life   Select Quality of Life      Quality of Life Scores   Health/Function Pre 9.97 %    Socioeconomic Pre 30 %    Psych/Spiritual Pre 15.43 %    Family Pre 27.6 %    GLOBAL Pre 17.81 %          Scores of 19 and below usually indicate a poorer quality of life in these areas.  A difference of  2-3 points is a clinically meaningful difference.  A difference of 2-3 points in the total score of the Quality of Life Index has been associated with significant improvement in overall quality of  life, self-image, physical symptoms, and general health in studies assessing change in quality of life.  PHQ-9: Recent Review Flowsheet Data    Depression screen [facility] 2/9 05/12/2020 03/24/2020 02/18/2020 08/23/2019 08/22/2018   Decreased Interest  0 1 0 0 0   Down, Depressed, Hopeless 0 1 0 0 0   PHQ - 2 Score 0 2 0 0 0   Altered sleeping 1 0 1 - -   Tired, decreased energy 1 3 3  - -   Change in appetite 0 2 2 - -   Feeling bad or failure about yourself  1 1 0 - -   Trouble concentrating 0 0 0 - -   Moving slowly or fidgety/restless 0 0 0 - -   Suicidal thoughts 0 0 0 - -   PHQ-9 Score 3 8 6  - -   Difficult doing work/chores Not difficult at all Somewhat difficult Not difficult at all - -     Interpretation of Total Score  Total Score Depression Severity:  1-4 = Minimal depression, 5-9 = Mild depression, 10-14 = Moderate depression, 15-19 = Moderately severe depression, 20-27 = Severe depression   Psychosocial Evaluation and Intervention:  Psychosocial Evaluation - 03/17/20 1430      Psychosocial Evaluation & Interventions   Interventions Stress management education;Relaxation education;Encouraged to exercise with the program and follow exercise prescription    Comments Kayla Maxwell reports doing okay post MI. She Kayla Maxwell is having issues sleeping, some of it related to anxiety from her CPAP mask. She doesn't report any pain, but increased weakness. Her sister is her main support system and helps her daily. She is looking forward to coming to Cardiac Rehab for education and exercise to boost her stamina and strength. She does struggle with a decreased appetite which is new post MI.    Expected Outcomes Short: attend cardiac rehab for education and exercise. Long: develop and maintain positive self care    Continue Psychosocial Services  Follow up required by staff           Psychosocial Re-Evaluation:  Psychosocial Re-Evaluation    [place] Name 04/14/20 1401 05/12/20 1358            Psychosocial Re-Evaluation   Current issues with Current Anxiety/Panic;Current Stress Concerns Current Psychotropic Meds      Comments Kayla Maxwell report no new stress or sleep concerns. She reports that she continues to have a strong support system and is sleeping better. She has had a smaller appitite after her MI. We discussed that improtance of meeting her caloric needs even though her appitite is not a big. Reviewed patient health questionnaire (PHQ-9) with patient for follow up. Previously, patients score indicated signs/symptoms of depression.  Reviewed to see if patient is improving symptom wise while in program.  Score improved and patient states that it is because she has been able to exercise and feels like her health is back under control. Her health care worker come to her house three times a week and is not by herself so much.      Expected Outcomes Short: continue healthy sleeping pattern. Long: maintain good mental health and stress management. Short: Continue to attend [facility] regularly for regular exercise and social engagement. Long: Continue to improve symptoms and manage a positive mental state.      Interventions Encouraged to attend Cardiac Rehabilitation for the exercise Encouraged to attend Cardiac Rehabilitation for the exercise      Continue Psychosocial Services  Follow up required by staff No Follow up required             Psychosocial Discharge (Final Psychosocial Re-Evaluation):  Psychosocial Re-Evaluation - 05/12/20 1358      Psychosocial Re-Evaluation   Current issues  with Current Psychotropic Meds    Comments Reviewed patient health questionnaire (PHQ-9) with patient for follow up. Previously, patients score indicated signs/symptoms of depression.  Reviewed to see if patient is improving symptom wise while in program.  Score improved and patient states that it is because she has been able to exercise and feels like her health is back under control. Her health care  worker come to her house three times a week and is not by herself so much.    Expected Outcomes Short: Continue to attend [facility] regularly for regular exercise and social engagement. Long: Continue to improve symptoms and manage a positive mental state.    Interventions Encouraged to attend Cardiac Rehabilitation for the exercise    Continue Psychosocial Services  No Follow up required           Vocational Rehabilitation: Provide vocational rehab assistance to qualifying candidates.   Vocational Rehab Evaluation & Intervention:  Vocational Rehab - 03/17/20 1413      Initial Vocational Rehab Evaluation & Intervention   Assessment shows need for Vocational Rehabilitation No           Education: Education Goals: Education classes will be provided on a variety of topics geared toward better understanding of heart health and risk factor modification. Participant will state understanding/return demonstration of topics presented as noted by education test scores.  Learning Barriers/Preferences:  Learning Barriers/Preferences - 03/17/20 1413      Learning Barriers/Preferences   Learning Barriers None    Learning Preferences None           General Cardiac Education Topics:  AED/CPR: - Group verbal and written instruction with the use of models to demonstrate the basic use of the AED with the basic ABC's of resuscitation.   Anatomy and Cardiac Procedures: - Group verbal and visual presentation and models provide information about basic cardiac anatomy and function. Reviews the testing methods done to diagnose heart disease and the outcomes of the test results. Describes the treatment choices: Medical Management, Angioplasty, or Coronary Bypass Surgery for treating various heart conditions including Myocardial Infarction, Angina, Valve Disease, and Cardiac Arrhythmias.  Written material given at graduation. Flowsheet Row Cardiac Rehab from 03/24/2020 in [facility] Cardiac and Pulmonary  Rehab  Education need identified 03/24/20      Medication Safety: - Group verbal and visual instruction to review commonly prescribed medications for heart and lung disease. Reviews the medication, class of the drug, and side effects. Includes the steps to properly store meds and maintain the prescription regimen.  Written material given at graduation.   Intimacy: - Group verbal instruction through game format to discuss how heart and lung disease can affect sexual intimacy. Written material given at graduation..   Know Your Numbers and Heart Failure: - Group verbal and visual instruction to discuss disease risk factors for cardiac and pulmonary disease and treatment options.  Reviews associated critical values for Overweight/Obesity, Hypertension, Cholesterol, and Diabetes.  Discusses basics of heart failure: signs/symptoms and treatments.  Introduces Heart Failure Zone chart for action plan for heart failure.  Written material given at graduation.   Infection Prevention: - Provides verbal and written material to individual with discussion of infection control including proper hand washing and proper equipment cleaning during exercise session. Flowsheet Row Cardiac Rehab from 03/24/2020 in [facility] Cardiac and Pulmonary Rehab  Date 03/24/20  Educator [facility]  Instruction Review Code 1- Verbalizes Understanding      Falls Prevention: - Provides verbal and written material to individual with  discussion of falls prevention and safety. Flowsheet Row Cardiac Rehab from 03/24/2020 in [facility] Cardiac and Pulmonary Rehab  Date 03/24/20  Educator [facility]  Instruction Review Code 1- Verbalizes Understanding      Other: -Provides group and verbal instruction on various topics (see comments)   Knowledge Questionnaire Score:  Knowledge Questionnaire Score - 03/24/20 1419      Knowledge Questionnaire Score   Pre Score 23/26 Education Focus: angina, nurtition, exercise           Core Components/Risk  Factors/Patient Goals at Admission:  Personal Goals and Risk Factors at Admission - 03/24/20 1420      Core Components/Risk Factors/Patient Goals on Admission    Weight Management Yes;Weight Loss;Weight Maintenance    Intervention Weight Management: Develop a combined nutrition and exercise program designed to reach desired caloric intake, while maintaining appropriate intake of nutrient and fiber, sodium and fats, and appropriate energy expenditure required for the weight goal.;Weight Management: Provide education and appropriate resources to help participant work on and attain dietary goals.;Weight Management/Obesity: Establish reasonable short term and long term weight goals.    Admit Weight 168 lb 11.2 oz (76.5 kg)    Goal Weight: Short Term 165 lb (74.8 kg)    Goal Weight: Long Term 165 lb (74.8 kg)    Expected Outcomes Long Term: Adherence to nutrition and physical activity/exercise program aimed toward attainment of established weight goal;Short Term: Continue to assess and modify interventions until short term weight is achieved;Weight Maintenance: Understanding of the daily nutrition guidelines, which includes 25-35% calories from fat, 7% or less cal from saturated fats, less than 200mg  cholesterol, less than 1.5gm of sodium, & 5 or more servings of fruits and vegetables daily;Understanding recommendations for meals to include 15-35% energy as protein, 25-35% energy from fat, 35-60% energy from carbohydrates, less than 200mg  of dietary cholesterol, 20-35 gm of total fiber daily;Weight Loss: Understanding of general recommendations for a balanced deficit meal plan, which promotes 1-2 lb weight loss per week and includes a negative energy balance of [phone] kcal/d;Understanding of distribution of calorie intake throughout the day with the consumption of 4-5 meals/snacks    Heart Failure Yes    Intervention Provide a combined exercise and nutrition program that is supplemented with education,  support and counseling about heart failure. Directed toward relieving symptoms such as shortness of breath, decreased exercise tolerance, and extremity edema.    Expected Outcomes Improve functional capacity of life;Short term: Attendance in program 2-3 days a week with increased exercise capacity. Reported lower sodium intake. Reported increased fruit and vegetable intake. Reports medication compliance.;Short term: Daily weights obtained and reported for increase. Utilizing diuretic protocols set by physician.;Long term: Adoption of self-care skills and reduction of barriers for early signs and symptoms recognition and intervention leading to self-care maintenance.    Hypertension Yes    Intervention Provide education on lifestyle modifcations including regular physical activity/exercise, weight management, moderate sodium restriction and increased consumption of fresh fruit, vegetables, and low fat dairy, alcohol moderation, and smoking cessation.;Monitor prescription use compliance.    Expected Outcomes Short Term: Continued assessment and intervention until BP is < 140/6mm HG in hypertensive participants. < 130/70mm HG in hypertensive participants with diabetes, heart failure or chronic kidney disease.;Long Term: Maintenance of blood pressure at goal levels.    Lipids Yes    Intervention Provide education and support for participant on nutrition & aerobic/resistive exercise along with prescribed medications to achieve LDL 70mg , HDL >40mg .    Expected Outcomes Short Term:  Participant states understanding of desired cholesterol values and is compliant with medications prescribed. Participant is following exercise prescription and nutrition guidelines.;Long Term: Cholesterol controlled with medications as prescribed, with individualized exercise RX and with personalized nutrition plan. Value goals: LDL < 70mg , HDL > 40 mg.           Education:Diabetes - Individual verbal and written instruction to  review signs/symptoms of diabetes, desired ranges of glucose level fasting, after meals and with exercise. Acknowledge that pre and post exercise glucose checks will be done for 3 sessions at entry of program.   Core Components/Risk Factors/Patient Goals Review:   Goals and Risk Factor Review    Row Name 04/14/20 1355 05/12/20 1349           Core Components/Risk Factors/Patient Goals Review   Personal Goals Review Weight Management/Obesity;Hypertension;Heart Failure;Lipids Weight Management/Obesity;Hypertension;Heart Failure      Review Patient reports that she takes all medications at prescribed. She weighs every morning and keeps track to her weights. She reports that her weight has been consistent and knows the proticol of what to do when there are major changes in weight or heart failure symptoms. Kayla Maxwell is checkin her blood pressure at home and states that it is very close to what we are monitoring in Rehab. She would like to lose a little more weight. She wantst to reach a weight goal of 135-130lbs.      Expected Outcomes Short: continue to attend cardiac rehab consistently and continue to weigh every day. Long: adapt to a heart healthy lifestyle to control cardiac risk factors. Short: lose more weight. Long: reach her weight goal.             Core Components/Risk Factors/Patient Goals at Discharge (Final Review):   Goals and Risk Factor Review - 05/12/20 1349      Core Components/Risk Factors/Patient Goals Review   Personal Goals Review Weight Management/Obesity;Hypertension;Heart Failure    Review Kayla Maxwell is checkin her blood pressure at home and states that it is very close to what we are monitoring in Rehab. She would like to lose a little more weight. She wantst to reach a weight goal of 135-130lbs.    Expected Outcomes Short: lose more weight. Long: reach her weight goal.           ITP Comments:  ITP Comments    Row Name 03/17/20 1421 03/24/20 1409 03/26/20 1124 04/07/20  1342 04/09/20 1631   ITP Comments Initial telelphone orientation completed. Diagnosis can be found in [facility] 2/8. EP orientation scheduled for Monday 3/21 at 10am. Completed 6MWT and gym orientation. Initial ITP created and sent for review to Dr. Emily Filbert, Medical Director. 30 Day review completed. Medical Director ITP review done, changes made as directed, and signed approval by Medical Director.  New to program First full day of exercise!  Patient was oriented to gym and equipment including functions, settings, policies, and procedures.  Patient's individual exercise prescription and treatment plan were reviewed.  All starting workloads were established based on the results of the 6 minute walk test done at initial orientation visit.  The plan for exercise progression was also introduced and progression will be customized based on patient's performance and goals. Completed initial RD evaluation   Row Name 04/21/20 1429 04/23/20 [id number] 05/21/20 0957 05/27/20 1036 06/06/20 0949   ITP Comments While on the TM for less than 45 seconds, Kayla Maxwell got short of breath , then got the dry heaves. This lasted  For about 5 minutes  and then she felt better. HR increased to 113 during this time. She stated that this is normal for her and that there is not any known trigger. It just happens.  Skin warm and dry, BP and HR in good range. She went home escorted by her aide. 30 Day review completed. Medical Director ITP review done, changes made as directed, and signed approval by Medical Director. 30 Day review completed. Medical Director ITP review done, changes made as directed, and signed approval by Medical Director. Kayla Maxwell reports being out due to other things going on like needing her teeth pulled - she was at the MD office and will call back to discuss attendance. Kayla Maxwell has not attended rehab since 5/9 and would like to discharge at this time.          Comments: Discharge ITP

## 2020-06-06 NOTE — Progress Notes (Signed)
Discharge Progress Report  Patient Details  Name: Kayla Maxwell MRN: [id number] Date of Birth: [birth year] Referring Provider:   Flowsheet Row Cardiac Rehab from 03/24/2020 in [facility] Cardiac and Pulmonary Rehab  Referring Provider Kayla Royals  MD       Number of Visits: 9  Reason for Discharge:  Early Exit:  Personal  Smoking History:  Social History   Tobacco Use  Smoking Status Former Smoker  . Packs/day: 1.00  . Years: 47.00  . Pack years: 47.00  . Quit date: 08/19/2014  . Years since quitting: 5.8  Smokeless Tobacco Former Systems developer  . Quit date: 08/19/2014    Diagnosis:  ST elevation myocardial infarction (STEMI), unspecified artery (HCC)  ADL UCSD:   Initial Exercise Prescription:  Initial Exercise Prescription - 03/24/20 1400      Date of Initial Exercise RX and Referring Provider   Date 03/24/20    Referring Provider Kayla Royals  MD      Treadmill   MPH 0.8    Grade 0    Minutes 5    METs 1.6      Recumbant Bike   Level 1    RPM 50    Watts 10    Minutes 15    METs 1.5      NuStep   Level 1    SPM 80    Minutes 15    METs 1.5      REL-XR   Level 1    Speed 50    Minutes 15    METs 1.5      Prescription Details   Frequency (times per week) 2    Duration Progress to 30 minutes of continuous aerobic without signs/symptoms of physical distress      Intensity   THRR 40-80% of Max Heartrate 106-133    Ratings of Perceived Exertion 11-13    Perceived Dyspnea 0-4      Progression   Progression Continue to progress workloads to maintain intensity without signs/symptoms of physical distress.      Resistance Training   Training Prescription Yes    Weight 3 lb    Reps 10-15           Discharge Exercise Prescription (Final Exercise Prescription Changes):  Exercise Prescription Changes - 05/13/20 1400      Response to Exercise   Blood Pressure (Admit) 124/62    Blood Pressure (Exercise) 120/60    Blood Pressure (Exit) 112/64     Heart Rate (Admit) 76 bpm    Heart Rate (Exercise) 101 bpm    Heart Rate (Exit) 81 bpm    Rating of Perceived Exertion (Exercise) 11    Symptoms none    Duration Progress to 30 minutes of  aerobic without signs/symptoms of physical distress    Intensity THRR unchanged      Progression   Progression Continue to progress workloads to maintain intensity without signs/symptoms of physical distress.    Average METs --   not reporting METs on XR and only 2 laps on track     Resistance Training   Training Prescription Yes    Weight 3 lb    Reps 10-15      Interval Training   Interval Training No      REL-XR   Level 1    Minutes 30           Functional Capacity:  6 Minute Walk    Row Name 03/24/20 1409  6 Minute Walk   Phase Initial     Distance 400 feet     Walk Time 4.02 minutes     # of Rest Breaks 5  5 sec, 7 sec, 7 sec, 1:10, 8 sec, stopped at 5:42     MPH 1.13     METS 1.09     RPE 17     Perceived Dyspnea  3     VO2 Peak 3.84     Symptoms Yes (comment)     Comments SOB, chronic back pain 6/10     Resting HR 79 bpm     Resting BP 134/64     Resting Oxygen Saturation  93 %     Exercise Oxygen Saturation  during 6 min walk 91 %     Max Ex. HR 100 bpm     Max Ex. BP 148/82     2 Minute Post BP 122/66            Psychological, QOL, Others - Outcomes: PHQ 2/9: Depression screen [facility] 2/9 05/12/2020 03/24/2020 02/18/2020 08/23/2019 08/22/2018  Decreased Interest 0 1 0 0 0  Down, Depressed, Hopeless 0 1 0 0 0  PHQ - 2 Score 0 2 0 0 0  Altered sleeping 1 0 1 - -  Tired, decreased energy 1 3 3  - -  Change in appetite 0 2 2 - -  Feeling bad or failure about yourself  1 1 0 - -  Trouble concentrating 0 0 0 - -  Moving slowly or fidgety/restless 0 0 0 - -  Suicidal thoughts 0 0 0 - -  PHQ-9 Score 3 8 6  - -  Difficult doing work/chores Not difficult at all Somewhat difficult Not difficult at all - -    Quality of Life:  Quality of Life - 03/24/20 1416       Quality of Life   Select Quality of Life      Quality of Life Scores   Health/Function Pre 9.97 %    Socioeconomic Pre 30 %    Psych/Spiritual Pre 15.43 %    Family Pre 27.6 %    GLOBAL Pre 17.81 %           Nutrition & Weight - Outcomes:  Pre Biometrics - 03/24/20 1416      Pre Biometrics   Height 5' 3.9" (1.623 m)    Weight 168 lb 11.2 oz (76.5 kg)    BMI (Calculated) 29.05    Single Leg Stand 3.2 seconds            Nutrition:  Nutrition Therapy & Goals - 04/09/20 1606      Nutrition Therapy   Diet Heart healthy, low Na    Protein (specify units) 60g    Fiber 25 grams    Whole Grain Foods 3 servings    Saturated Fats 12 max. grams    Fruits and Vegetables 8 servings/day    Sodium 1.5 grams      Personal Nutrition Goals   Nutrition Goal ST: Eat a rainbow of vegetables with dinner, have either starchy vegetables or grains with dinner as well LT: meet nutritional needs with one meal per day    Comments Kayla Maxwell reports that she lost her appetite since her heart attack. Hasn't eaten anythign today - now 4pm. She enjoys fish and roasted chicken. 1 meal per day - brussells sprouts or green beans or another kind of green vegetable. Saute most of her foods - olive oil.  She has whole wheat bread as a sandwich - chicken sandwich.  She cooks with very little salt - she has cut back. Drinks: water and milk (chocolate or whole milk). She was always eating one meal, but portions have cut down due to appetite. Similar diet to that before the heart attack. Weight stable - within 5 pounds. She has low energy since the heart attack, does not change with eating. Discussed heart healthy eating.      Intervention Plan   Intervention Nutrition handout(s) given to patient.;Prescribe, educate and counsel regarding individualized specific dietary modifications aiming towards targeted core components such as weight, hypertension, lipid management, diabetes, heart failure and other  comorbidities.    Expected Outcomes Short Term Goal: Understand basic principles of dietary content, such as calories, fat, sodium, cholesterol and nutrients.;Short Term Goal: A plan has been developed with personal nutrition goals set during dietitian appointment.;Long Term Goal: Adherence to prescribed nutrition plan.           Nutrition Discharge:   Education Questionnaire Score:  Knowledge Questionnaire Score - 03/24/20 1419      Knowledge Questionnaire Score   Pre Score 23/26 Education Focus: angina, nurtition, exercise           Goals reviewed with patient; copy given to patient.

## 2020-06-18 ENCOUNTER — Telehealth: Payer: Self-pay

## 2020-06-18 ENCOUNTER — Ambulatory Visit: Payer: Self-pay

## 2020-06-18 ENCOUNTER — Ambulatory Visit: Payer: Medicare Other | Admitting: Internal Medicine

## 2020-06-18 DIAGNOSIS — Z20822 Contact with and (suspected) exposure to covid-19: Secondary | ICD-10-CM | POA: Diagnosis not present

## 2020-06-18 DIAGNOSIS — Z7901 Long term (current) use of anticoagulants: Secondary | ICD-10-CM | POA: Diagnosis not present

## 2020-06-18 DIAGNOSIS — K529 Noninfective gastroenteritis and colitis, unspecified: Secondary | ICD-10-CM | POA: Diagnosis not present

## 2020-06-18 DIAGNOSIS — R112 Nausea with vomiting, unspecified: Secondary | ICD-10-CM | POA: Diagnosis not present

## 2020-06-18 DIAGNOSIS — Z8679 Personal history of other diseases of the circulatory system: Secondary | ICD-10-CM | POA: Diagnosis not present

## 2020-06-18 NOTE — Telephone Encounter (Signed)
      Message from Rayann Heman sent at 06/18/2020 10:19 AM EDT  Summary: Nauseous and weak   Pt called and stated that she is feeling very nauseous and weak. Patient states that she has been vomiting. Pt states that she would like to discuss this with a nurse. Please advise             Call History   Type Contact Phone/Fax User  06/18/2020 10:17 AM EDT Phone (Incoming) Balling, TRUE [place] (Self) [phone] Lemmie Evens) Rayann Heman    Answer Assessment - Initial Assessment Questions 1. VOMITING SEVERITY: "How many times have you vomited in the past 24 hours?"     - MILD:  1 - 2 times/day    - MODERATE: 3 - 5 times/day, decreased oral intake without significant weight loss or symptoms of dehydration    - SEVERE: 6 or more times/day, vomits everything or nearly everything, with significant weight loss, symptoms of dehydration      More than 6 2. ONSET: "When did the vomiting begin?"      Last night 3. FLUIDS: "What fluids or food have you vomited up today?" "Have you been able to keep any fluids down?"     Cramping 4. ABDOMINAL PAIN: "Are your having any abdominal pain?" If yes : "How bad is it and what does it feel like?" (e.g., crampy, dull, intermittent, constant)      No 5. DIARRHEA: "Is there any diarrhea?" If Yes, ask: "How many times today?"      No 6. CONTACTS: "Is there anyone else in the family with the same symptoms?"      No 7. CAUSE: "What do you think is causing your vomiting?"     Unsure 8. HYDRATION STATUS: "Any signs of dehydration?" (e.g., dry mouth [not only dry lips], too weak to stand) "When did you last urinate?"     No 9. OTHER SYMPTOMS: "Do you have any other symptoms?" (e.g., fever, headache, vertigo, vomiting blood or coffee grounds, recent head injury)     Weakness 10. PREGNANCY: "Is there any chance you are pregnant?" "When was your last menstrual period?"       No  Protocols used: Vomiting-A-AH

## 2020-06-18 NOTE — Telephone Encounter (Signed)
Another message was sent to provider regarding this. Will await response.

## 2020-06-18 NOTE — Telephone Encounter (Signed)
Copied from [place] [phone]. Topic: Appointment Scheduling - Scheduling Inquiry for Clinic >> Jun 18, 2020 10:19 AM Rayann Heman wrote: Reason for CRM: Pt called and stated that she would like to schedule an appointment today. She states that she is very nauseous and weak. Pt states that she would like the appointment between 11-3 because she will have a health care worker with her. Please advise.  Called [facility] to get an appointment today at 1:20pm patient declined because she wants to be seen in person. Asked if an of the providers can see her tomorrow

## 2020-06-18 NOTE — Telephone Encounter (Signed)
Please advise. Patient wants to be seen in office.

## 2020-06-18 NOTE — Telephone Encounter (Signed)
Pt. Reports she started vomiting last night. No fever or diarrhea. Keeping water down. States the practice offered her a virtual visit, but she wants to be seen in the office. States "they are going to call me back about an appointment." Please advise pt.

## 2020-06-20 ENCOUNTER — Inpatient Hospital Stay
Admission: EM | Admit: 2020-06-20 | Discharge: 2020-06-24 | DRG: 392 | Disposition: A | Discharge status: Skilled Nursing Facility | Payer: Medicare Other | Attending: Internal Medicine | Admitting: Internal Medicine

## 2020-06-20 ENCOUNTER — Emergency Department: Payer: Medicare Other

## 2020-06-20 ENCOUNTER — Ambulatory Visit: Payer: Medicare Other | Admitting: Family Medicine

## 2020-06-20 DIAGNOSIS — K5730 Diverticulosis of large intestine without perforation or abscess without bleeding: Secondary | ICD-10-CM | POA: Diagnosis present

## 2020-06-20 DIAGNOSIS — Z20822 Contact with and (suspected) exposure to covid-19: Secondary | ICD-10-CM | POA: Diagnosis present

## 2020-06-20 DIAGNOSIS — Z7901 Long term (current) use of anticoagulants: Secondary | ICD-10-CM

## 2020-06-20 DIAGNOSIS — R112 Nausea with vomiting, unspecified: Secondary | ICD-10-CM | POA: Diagnosis not present

## 2020-06-20 DIAGNOSIS — K219 Gastro-esophageal reflux disease without esophagitis: Secondary | ICD-10-CM | POA: Diagnosis present

## 2020-06-20 DIAGNOSIS — A084 Viral intestinal infection, unspecified: Secondary | ICD-10-CM | POA: Diagnosis not present

## 2020-06-20 DIAGNOSIS — Z66 Do not resuscitate: Secondary | ICD-10-CM | POA: Diagnosis not present

## 2020-06-20 DIAGNOSIS — E1142 Type 2 diabetes mellitus with diabetic polyneuropathy: Secondary | ICD-10-CM | POA: Diagnosis present

## 2020-06-20 DIAGNOSIS — I110 Hypertensive heart disease with heart failure: Secondary | ICD-10-CM | POA: Diagnosis not present

## 2020-06-20 DIAGNOSIS — Z8673 Personal history of transient ischemic attack (TIA), and cerebral infarction without residual deficits: Secondary | ICD-10-CM

## 2020-06-20 DIAGNOSIS — Z90710 Acquired absence of both cervix and uterus: Secondary | ICD-10-CM

## 2020-06-20 DIAGNOSIS — I4821 Permanent atrial fibrillation: Secondary | ICD-10-CM | POA: Diagnosis present

## 2020-06-20 DIAGNOSIS — R0602 Shortness of breath: Secondary | ICD-10-CM | POA: Diagnosis not present

## 2020-06-20 DIAGNOSIS — I509 Heart failure, unspecified: Secondary | ICD-10-CM | POA: Diagnosis not present

## 2020-06-20 DIAGNOSIS — Z800 Family history of malignant neoplasm of digestive organs: Secondary | ICD-10-CM

## 2020-06-20 DIAGNOSIS — R1084 Generalized abdominal pain: Secondary | ICD-10-CM | POA: Diagnosis not present

## 2020-06-20 DIAGNOSIS — R5383 Other fatigue: Secondary | ICD-10-CM | POA: Diagnosis not present

## 2020-06-20 DIAGNOSIS — I2510 Atherosclerotic heart disease of native coronary artery without angina pectoris: Secondary | ICD-10-CM | POA: Diagnosis present

## 2020-06-20 DIAGNOSIS — R110 Nausea: Secondary | ICD-10-CM | POA: Diagnosis not present

## 2020-06-20 DIAGNOSIS — Z87891 Personal history of nicotine dependence: Secondary | ICD-10-CM

## 2020-06-20 DIAGNOSIS — E119 Type 2 diabetes mellitus without complications: Secondary | ICD-10-CM

## 2020-06-20 DIAGNOSIS — Z7984 Long term (current) use of oral hypoglycemic drugs: Secondary | ICD-10-CM

## 2020-06-20 DIAGNOSIS — Z820 Family history of epilepsy and other diseases of the nervous system: Secondary | ICD-10-CM

## 2020-06-20 DIAGNOSIS — Z8249 Family history of ischemic heart disease and other diseases of the circulatory system: Secondary | ICD-10-CM

## 2020-06-20 DIAGNOSIS — F32A Depression, unspecified: Secondary | ICD-10-CM | POA: Diagnosis present

## 2020-06-20 DIAGNOSIS — Z888 Allergy status to other drugs, medicaments and biological substances status: Secondary | ICD-10-CM

## 2020-06-20 DIAGNOSIS — E785 Hyperlipidemia, unspecified: Secondary | ICD-10-CM | POA: Diagnosis present

## 2020-06-20 DIAGNOSIS — Z880 Allergy status to penicillin: Secondary | ICD-10-CM

## 2020-06-20 DIAGNOSIS — R54 Age-related physical debility: Secondary | ICD-10-CM | POA: Diagnosis present

## 2020-06-20 DIAGNOSIS — E876 Hypokalemia: Secondary | ICD-10-CM | POA: Diagnosis present

## 2020-06-20 DIAGNOSIS — J111 Influenza due to unidentified influenza virus with other respiratory manifestations: Secondary | ICD-10-CM

## 2020-06-20 DIAGNOSIS — Z8541 Personal history of malignant neoplasm of cervix uteri: Secondary | ICD-10-CM

## 2020-06-20 DIAGNOSIS — Z7982 Long term (current) use of aspirin: Secondary | ICD-10-CM

## 2020-06-20 DIAGNOSIS — I252 Old myocardial infarction: Secondary | ICD-10-CM

## 2020-06-20 DIAGNOSIS — Z885 Allergy status to narcotic agent status: Secondary | ICD-10-CM

## 2020-06-20 DIAGNOSIS — I10 Essential (primary) hypertension: Secondary | ICD-10-CM | POA: Diagnosis not present

## 2020-06-20 DIAGNOSIS — I4891 Unspecified atrial fibrillation: Secondary | ICD-10-CM | POA: Diagnosis not present

## 2020-06-20 DIAGNOSIS — N281 Cyst of kidney, acquired: Secondary | ICD-10-CM | POA: Diagnosis not present

## 2020-06-20 DIAGNOSIS — Z7902 Long term (current) use of antithrombotics/antiplatelets: Secondary | ICD-10-CM

## 2020-06-20 DIAGNOSIS — Z9049 Acquired absence of other specified parts of digestive tract: Secondary | ICD-10-CM

## 2020-06-20 DIAGNOSIS — Z79899 Other long term (current) drug therapy: Secondary | ICD-10-CM

## 2020-06-20 DIAGNOSIS — Z91040 Latex allergy status: Secondary | ICD-10-CM

## 2020-06-20 DIAGNOSIS — I5022 Chronic systolic (congestive) heart failure: Secondary | ICD-10-CM | POA: Diagnosis present

## 2020-06-20 DIAGNOSIS — E782 Mixed hyperlipidemia: Secondary | ICD-10-CM | POA: Diagnosis present

## 2020-06-20 DIAGNOSIS — M47816 Spondylosis without myelopathy or radiculopathy, lumbar region: Secondary | ICD-10-CM | POA: Diagnosis not present

## 2020-06-20 DIAGNOSIS — Z882 Allergy status to sulfonamides status: Secondary | ICD-10-CM

## 2020-06-20 DIAGNOSIS — N200 Calculus of kidney: Secondary | ICD-10-CM | POA: Diagnosis not present

## 2020-06-20 DIAGNOSIS — R000 Tachycardia, unspecified: Secondary | ICD-10-CM | POA: Diagnosis not present

## 2020-06-20 DIAGNOSIS — R0902 Hypoxemia: Secondary | ICD-10-CM | POA: Diagnosis not present

## 2020-06-20 LAB — COMPREHENSIVE METABOLIC PANEL
ALT: 17 U/L (ref 0–44)
AST: 19 U/L (ref 15–41)
Albumin: 3.5 g/dL (ref 3.5–5.0)
Alkaline Phosphatase: 66 U/L (ref 38–126)
Anion gap: 7 (ref 5–15)
BUN: 23 mg/dL (ref 8–23)
CO2: 24 mmol/L (ref 22–32)
Calcium: 8.7 mg/dL — ABNORMAL LOW (ref 8.9–10.3)
Chloride: 104 mmol/L (ref 98–111)
Creatinine, Ser: 1.04 mg/dL — ABNORMAL HIGH (ref 0.44–1.00)
GFR, Estimated: 56 mL/min — ABNORMAL LOW (ref >60)
Glucose, Bld: 154 mg/dL — ABNORMAL HIGH (ref 70–99)
Potassium: 3.2 mmol/L — ABNORMAL LOW (ref 3.5–5.1)
Sodium: 135 mmol/L (ref 135–145)
Total Bilirubin: 1.3 mg/dL — ABNORMAL HIGH (ref 0.3–1.2)
Total Protein: 6.2 g/dL — ABNORMAL LOW (ref 6.5–8.1)

## 2020-06-20 LAB — CBC WITH DIFFERENTIAL/PLATELET
Abs Immature Granulocytes: 0.06 10*3/uL (ref 0.00–0.07)
Basophils Absolute: 0 10*3/uL (ref 0.0–0.1)
Basophils Relative: 0 %
Eosinophils Absolute: 0 10*3/uL (ref 0.0–0.5)
Eosinophils Relative: 0 %
HCT: 39.6 % (ref 36.0–46.0)
Hemoglobin: 12 g/dL (ref 12.0–15.0)
Immature Granulocytes: 1 %
Lymphocytes Relative: 8 %
Lymphs Abs: 1 10*3/uL (ref 0.7–4.0)
MCH: 23.6 pg — ABNORMAL LOW (ref 26.0–34.0)
MCHC: 30.3 g/dL (ref 30.0–36.0)
MCV: 78 fL — ABNORMAL LOW (ref 80.0–100.0)
Monocytes Absolute: 0.8 10*3/uL (ref 0.1–1.0)
Monocytes Relative: 6 %
Neutro Abs: 10.6 10*3/uL — ABNORMAL HIGH (ref 1.7–7.7)
Neutrophils Relative %: 85 %
Platelets: 282 10*3/uL (ref 150–400)
RBC: 5.08 MIL/uL (ref 3.87–5.11)
RDW: 16.9 % — ABNORMAL HIGH (ref 11.5–15.5)
WBC: 12.4 10*3/uL — ABNORMAL HIGH (ref 4.0–10.5)
nRBC: 0 % (ref 0.0–0.2)

## 2020-06-20 LAB — RESP PANEL BY RT-PCR (FLU A&B, COVID) ARPGX2
Influenza A by PCR: NEGATIVE
Influenza B by PCR: NEGATIVE
SARS Coronavirus 2 by RT PCR: NEGATIVE

## 2020-06-20 LAB — LIPASE, BLOOD: Lipase: 87 U/L — ABNORMAL HIGH (ref 11–51)

## 2020-06-20 MED ORDER — LACTATED RINGERS IV BOLUS
500.0000 mL | Freq: Once | INTRAVENOUS | Status: AC
  Administered 2020-06-21: 500 mL via INTRAVENOUS

## 2020-06-20 MED ORDER — SODIUM CHLORIDE 0.9 % IV BOLUS
250.0000 mL | Freq: Once | INTRAVENOUS | Status: AC
  Administered 2020-06-20: 250 mL via INTRAVENOUS

## 2020-06-20 MED ORDER — ONDANSETRON HCL 4 MG/2ML IJ SOLN
4.0000 mg | Freq: Once | INTRAMUSCULAR | Status: AC
  Administered 2020-06-20: 4 mg via INTRAVENOUS
  Filled 2020-06-20: qty 2

## 2020-06-20 MED ORDER — DIPHENHYDRAMINE HCL 50 MG/ML IJ SOLN
12.5000 mg | Freq: Once | INTRAMUSCULAR | Status: AC
  Administered 2020-06-21: 12.5 mg via INTRAVENOUS
  Filled 2020-06-20: qty 1

## 2020-06-20 MED ORDER — FAMOTIDINE 20 MG PO TABS: 20.0000 mg | ORAL_TABLET | Freq: Two times a day (BID) | ORAL | 0 refills | Status: AC

## 2020-06-20 MED ORDER — PANTOPRAZOLE SODIUM 40 MG IV SOLR
40.0000 mg | Freq: Once | INTRAVENOUS | Status: AC
  Administered 2020-06-20: 40 mg via INTRAVENOUS
  Filled 2020-06-20: qty 40

## 2020-06-20 MED ORDER — METOCLOPRAMIDE HCL 5 MG/ML IJ SOLN
10.0000 mg | Freq: Once | INTRAMUSCULAR | Status: AC
  Administered 2020-06-21: 10 mg via INTRAVENOUS
  Filled 2020-06-20: qty 2

## 2020-06-20 MED ORDER — ONDANSETRON 4 MG PO TBDP: 4.0000 mg | ORAL_TABLET | Freq: Three times a day (TID) | ORAL | 0 refills | Status: AC | PRN

## 2020-06-20 NOTE — ED Notes (Signed)
Patient provided crackers & water for po challenge

## 2020-06-20 NOTE — ED Notes (Signed)
Patient denies pain.

## 2020-06-20 NOTE — ED Provider Notes (Signed)
[facility] Emergency Department Provider Note  ____________________________________________  Time seen: Approximately 9:39 PM  I have reviewed the triage vital signs and the nursing notes.   HISTORY  Chief Complaint Weakness (Pt arrives to ED from home via EMS c/o general malaise & weakness x 3-4 days, w/decrease in appetite & n/v. Pt seen at Urgent Care 1 day ago, neg Covid test there. Hx of MI in 02/22, also hx of afib. Alert & oriented x 4.)    HPI Kayla Maxwell is a 75 y.o. female with a history of atrial fibrillation CHF diabetes prior stroke who comes the ED complaining of of malaise and generalized weakness for the past 4 days associated with decreased appetite and nausea and vomiting.  Symptoms are constant, waxing waning, no aggravating or alleviating factors.  Also has generalized abdominal pain which is nonradiating and nonfocal.  Went to urgent care yesterday, had rapid COVID antigen test which was negative.    Past Medical History:  Diagnosis Date  . Acid reflux    takes Nexium daily  . Allergy    takes Singulair daily as needed  . Anemia   . Atrial fibrillation ([place])   . Cancer ([place]) AGE 75   CERVICAL cancer with removal  . CHF (congestive heart failure) ([place])   . Chronic back pain    stenosis  . Complication of anesthesia   . Constipation    takes Colace daily as needed  . Coronary artery disease   . Difficult intubation   . Dysrhythmia    atrial fibrillation dx 04/2014  . Headache    sinus   . History of blood transfusion   . History of shingles   . Hyperlipidemia    takes Pravastatin daily-started taking this 6 months ago  . Hypertension   . Lumbar surgical wound fluid collection   . Sleep apnea    CPAP  . Stroke ([place])    TIA'S X 2 --- one last yr and one this yr...lost her memory over 4-6 hrs.  . Stroke ([place]) 06-04-15   left with right side weakness  . TIA (transient ischemic attack)    showed up on an MRI but she never  knew it  . Urinary frequency      Patient Active Problem List   Diagnosis Date Noted  . Myocardial infarction ([place]) 02/12/2020  . Atheroembolism of foot, left ([place]) 01/11/2019  . Venous insufficiency of both lower extremities 11/02/2017  . Pain and swelling of lower leg 06/20/2017  . Benign essential HTN 11/01/2016  . SOBOE (shortness of breath on exertion) 11/01/2016  . Gastroenteritis 08/16/2016  . Bilateral carotid artery stenosis 03/12/2016  . Fine tremor 03/12/2016  . PAD (peripheral artery disease) ([place]) 01/14/2016  . Atherosclerosis of native arteries of extremity with rest pain ([place]) 11/10/2015  . Cough 06/09/2015  . Abnormal blood sugar 04/11/2015  . Anxiety 11/19/2014  . PTSD (post-traumatic stress disorder) 10/29/2014  . Allergic rhinitis 09/05/2014  . Mixed hyperlipidemia 09/05/2014  . Mild memory disturbance 09/05/2014  . H/O stroke without residual deficits 09/05/2014  . Edema 09/05/2014  . Spinal stenosis, lumbar 07/04/2014  . MI (mitral incompetence) 05/08/2014  . TI (tricuspid incompetence) 05/08/2014  . Chronic atrial fibrillation ([place]) 05/08/2014  . History of tobacco abuse 04/29/2014  . Gastro-esophageal reflux disease without esophagitis 04/29/2014  . Obstructive apnea 04/29/2014  . Atrial fibrillation by electrocardiography ([place]) 04/29/2014  . Neuritis or radiculitis due to rupture of lumbar intervertebral disc 09/21/2013  .  DDD (degenerative disc disease), lumbar 08/24/2013  . Bursitis, trochanteric 06/18/2013  . Nerve root inflammation 06/18/2013  . Arthralgia of hip 06/04/2013  . Calculus of gallbladder with other cholecystitis, without mention of obstruction 04/27/2012     Past Surgical History:  Procedure Laterality Date  . ABDOMINAL HYSTERECTOMY    . APPENDECTOMY  1963  . BACK SURGERY  08/19/2014   Dr. Hal Neer ([place] [facility])  . BREAST SURGERY  1984   augementation  . [place]  . CHOLECYSTECTOMY  2014   Dr Jamal Collin  .  COLONOSCOPY    . CORONARY STENT INTERVENTION N/A 02/13/2020   Procedure: CORONARY STENT INTERVENTION;  Surgeon: Yolonda Kida, MD;  Location: [facility];  Service: Cardiovascular;  Laterality: N/A;  . ESOPHAGOGASTRODUODENOSCOPY    . HAMMER TOE SURGERY Bilateral   . LEFT HEART CATH AND CORONARY ANGIOGRAPHY N/A 02/13/2020   Procedure: LEFT HEART CATH AND CORONARY ANGIOGRAPHY and possible PCI and stent;  Surgeon: Yolonda Kida, MD;  Location: [place] [facility];  Service: Cardiovascular;  Laterality: N/A;  . LOWER EXTREMITY ANGIOGRAPHY Left 01/23/2019   Procedure: LOWER EXTREMITY ANGIOGRAPHY;  Surgeon: Katha Cabal, MD;  Location: [place] [facility];  Service: Cardiovascular;  Laterality: Left;  . LOWER EXTREMITY ANGIOGRAPHY Left 02/20/2019   Procedure: LOWER EXTREMITY ANGIOGRAPHY;  Surgeon: Katha Cabal, MD;  Location: [place] [facility];  Service: Cardiovascular;  Laterality: Left;  . LUMBAR LAMINECTOMY WITH COFLEX 1 LEVEL Bilateral 07/04/2014   Procedure: Laminectomy and Foraminotomy - bilateral - Lumbar Four-Five with coflex ;  Surgeon: Karie Chimera, MD;  Location: [facility];  Service: Neurosurgery;  Laterality: Bilateral;  . LUMBAR WOUND DEBRIDEMENT N/A 08/19/2014   Procedure: Exploration of Lumbar wound;  Surgeon: Karie Chimera, MD;  Location: [facility];  Service: Neurosurgery;  Laterality: N/A;  . PERIPHERAL VASCULAR CATHETERIZATION Left 08/12/2015   Procedure: Lower Extremity Angiography;  Surgeon: Katha Cabal, MD;  Location: [place] [facility];  Service: Cardiovascular;  Laterality: Left;  . TONSILLECTOMY    . VENTRAL HERNIA REPAIR N/A 05/26/2015   Procedure: HERNIA REPAIR VENTRAL ADULT;  Surgeon: Christene Lye, MD;  Location: [facility] OR[facility];  Service: General;  Laterality: N/A;     Prior to Admission medications   Medication Sig Start Date End Date Taking? Authorizing Provider  famotidine (PEPCID) 20 MG tablet Take 1 tablet (20 mg total) by  mouth 2 (two) times daily. 06/20/20  Yes Carrie Mew, MD  ondansetron (ZOFRAN ODT) 4 MG disintegrating tablet Take 1 tablet (4 mg total) by mouth every 8 (eight) hours as needed for nausea or vomiting. 06/20/20  Yes Carrie Mew, MD  albuterol (PROVENTIL HFA;VENTOLIN HFA) 108 (90 Base) MCG/ACT inhaler Inhale 2 puffs into the lungs every 6 (six) hours as needed for wheezing or shortness of breath. 06/09/16   Mar Daring, PA-C  aspirin EC 81 MG EC tablet Take 1 tablet (81 mg total) by mouth daily. 06/08/15   Henreitta Leber, MD  dapagliflozin propanediol (FARXIGA) 5 MG TABS tablet Take 1 tablet (5 mg total) by mouth daily. 02/15/20   Lorella Nimrod, MD  FLUoxetine (PROZAC) 20 MG capsule TAKE 1 CAPSULE BY MOUTH EVERY DAY 02/29/20   Mar Daring, PA-C  furosemide (LASIX) 20 MG tablet Take 1 tablet (20 mg total) by mouth daily as needed for fluid or edema. 02/15/20   Lorella Nimrod, MD  gabapentin (NEURONTIN) 300 MG capsule TAKE 1 CAPSULE BY MOUTH EVERYDAY AT  BEDTIME Patient taking differently: Take 300 mg by mouth at bedtime. 01/31/20   Kris Hartmann, NP  HYDROcodone-acetaminophen (NORCO) 5-325 MG tablet Take 1-2 tablets by mouth every 8 (eight) hours as needed for moderate pain or severe pain. 05/30/20   Virginia Crews, MD  lisinopril (ZESTRIL) 2.5 MG tablet TAKE 1 TABLET BY MOUTH EVERY DAY 02/29/20   Mar Daring, PA-C  metoprolol tartrate (LOPRESSOR) 25 MG tablet TAKE 2 TABLETS BY MOUTH TWICE A DAY 03/13/20   Mar Daring, PA-C  midodrine (PROAMATINE) 2.5 MG tablet Take 1 tablet (2.5 mg total) by mouth 3 (three) times daily with meals. 02/15/20   Lorella Nimrod, MD  nitroGLYCERIN (NITROSTAT) 0.4 MG SL tablet Place 1 tablet (0.4 mg total) under the tongue every 5 (five) minutes x 3 doses as needed for chest pain. 02/15/20   Lorella Nimrod, MD  omeprazole (PRILOSEC) 40 MG capsule TAKE 1 CAPSULE BY MOUTH EVERY DAY 03/03/20   Mar Daring, PA-C  rivaroxaban  (XARELTO) 20 MG TABS tablet Take 20 mg by mouth daily with supper.  08/27/14   [provider]  rosuvastatin (CRESTOR) 20 MG tablet Take 1 tablet (20 mg total) by mouth daily. 02/15/20   Lorella Nimrod, MD  ticagrelor (BRILINTA) 90 MG TABS tablet Take 1 tablet (90 mg total) by mouth 2 (two) times daily. 02/15/20   Lorella Nimrod, MD     Allergies Codeine, Penicillins, Latex, Adhesive [tape], and Sulfa antibiotics   Family History  Problem Relation Age of Onset  . Alzheimer's disease Mother   . Heart attack Father   . Pancreatic cancer Sister   . Healthy Brother     Social History Social History   Tobacco Use  . Smoking status: Former    Packs/day: 1.00    Years: 47.00    Pack years: 47.00    Types: Cigarettes    Quit date: 08/19/2014    Years since quitting: 5.8  . Smokeless tobacco: Former    Quit date: 08/19/2014  Vaping Use  . Vaping Use: Never used  Substance Use Topics  . Alcohol use: No  . Drug use: No    Review of Systems  Constitutional:   No fever positive chills.  Positive fatigue and body aches ENT:   No sore throat.  Positive nasal congestion Cardiovascular:   No chest pain or syncope. Respiratory:   No dyspnea or cough. Gastrointestinal:   Positive as above for abdominal pain and vomiting Musculoskeletal:   Negative for focal pain or swelling All other systems reviewed and are negative except as documented above in ROS and HPI.  ____________________________________________   PHYSICAL EXAM:  VITAL SIGNS: ED Triage Vitals  Enc Vitals Group     BP 06/20/20 1935 (!) 144/74     Pulse Rate 06/20/20 1935 83     Resp 06/20/20 1935 (!) 22     Temp --      Temp src --      SpO2 06/20/20 1935 94 %     Weight 06/20/20 1938 155 lb (70.3 kg)     Height 06/20/20 1938 5\' 5"  (1.651 m)     Head Circumference --      Peak Flow --      Pain Score 06/20/20 1937 0     Pain Loc --      Pain Edu? --      Excl. in [place]? --     Vital signs reviewed, nursing  assessments reviewed.  Constitutional:   Alert and oriented. Non-toxic appearance. Eyes:   Conjunctivae are normal. EOMI. PERRL. ENT      Head:   Normocephalic and atraumatic.      Nose:   Wearing a mask.      Mouth/Throat:   Wearing a mask.      Neck:   No meningismus. Full ROM. Hematological/Lymphatic/Immunilogical:   No cervical lymphadenopathy. Cardiovascular:   RRR. Symmetric bilateral radial and DP pulses.  No murmurs. Cap refill less than 2 seconds. Respiratory:   Normal respiratory effort without tachypnea/retractions. Breath sounds are clear and equal bilaterally. No wheezes/rales/rhonchi. Gastrointestinal:   Soft with mild generalized tenderness, nonfocal. Non distended. There is no CVA tenderness.  No rebound, rigidity, or guarding. Genitourinary:   deferred Musculoskeletal:   Normal range of motion in all extremities. No joint effusions.  No lower extremity tenderness.  No edema. Neurologic:   Normal speech and language.  Motor grossly intact. No acute focal neurologic deficits are appreciated.  Skin:    Skin is warm, dry and intact. No rash noted.  No petechiae, purpura, or bullae.  ____________________________________________    LABS (pertinent positives/negatives) (all labs ordered are listed, but only abnormal results are displayed) Labs Reviewed  COMPREHENSIVE METABOLIC PANEL - Abnormal; Notable for the following components:      Result Value   Potassium 3.2 (*)    Glucose, Bld 154 (*)    Creatinine, Ser 1.04 (*)    Calcium 8.7 (*)    Total Protein 6.2 (*)    Total Bilirubin 1.3 (*)    GFR, Estimated 56 (*)    All other components within normal limits  LIPASE, BLOOD - Abnormal; Notable for the following components:   Lipase 87 (*)    All other components within normal limits  CBC WITH DIFFERENTIAL/PLATELET - Abnormal; Notable for the following components:   WBC 12.4 (*)    MCV 78.0 (*)    MCH 23.6 (*)    RDW 16.9 (*)    Neutro Abs 10.6 (*)    All other  components within normal limits  RESP PANEL BY RT-PCR (FLU A&B, COVID) ARPGX2  URINALYSIS, COMPLETE (UACMP) WITH MICROSCOPIC   ____________________________________________   EKG  Interpreted by me Atrial fibrillation rate of 83.  Left axis, normal intervals.  Poor R wave progression.  Normal ST segments and T waves.  No ischemic changes.  Unchanged from prior EKG on February 18, 2020.  ____________________________________________    RADIOLOGY  DG Chest Portable 1 View  Result Date: 06/20/2020 CLINICAL DATA:  Short of breath, fatigue EXAM: PORTABLE CHEST 1 VIEW COMPARISON:  02/12/2020 FINDINGS: 2 frontal views of the chest demonstrate an unremarkable cardiac silhouette. Stable atherosclerosis of the aortic arch. No airspace disease, effusion, or pneumothorax. No acute bony abnormality. IMPRESSION: 1. No acute intrathoracic process. Electronically Signed   By: Randa Ngo M.D.   On: 06/20/2020 20:17    ____________________________________________   PROCEDURES Procedures  ____________________________________________  DIFFERENTIAL DIAGNOSIS   Viral illness, bowel obstruction, diverticulitis, pancreatitis, dehydration, electrolyte abnormality  CLINICAL IMPRESSION / ASSESSMENT AND PLAN / ED COURSE  Medications ordered in the ED: Medications  sodium chloride 0.9 % bolus 250 mL (0 mLs Intravenous Stopped 06/20/20 2019)  ondansetron (ZOFRAN) injection 4 mg (4 mg Intravenous Given 06/20/20 1948)  pantoprazole (PROTONIX) injection 40 mg (40 mg Intravenous Given 06/20/20 1949)    Pertinent labs & imaging results that were available during my care of the patient were reviewed by me and considered in  my medical decision making (see chart for details).  Kayla Maxwell was evaluated in Emergency Department on 06/20/2020 for the symptoms described in the history of present illness. She was evaluated in the context of the global COVID-19 pandemic, which necessitated consideration that the  patient might be at risk for infection with the SARS-CoV-2 virus that causes COVID-19. Institutional protocols and algorithms that pertain to the evaluation of patients at risk for COVID-19 are in a state of rapid change based on information released by regulatory bodies including the CDC and federal and state organizations. These policies and algorithms were followed during the patient's care in the ED.   Patient presents with constitutional symptoms, abdominal pain, most likely viral illness.  Lab panel is overall reassuring.  COVID and flu are negative.  With her abdominal tenderness and vomiting and age and comorbidities, will obtain CT abdomen pelvis.  If there are no severe findings, I think she stable for discharge home with supportive care.  Clinical Course as of 06/20/20 2353  Fri Jun 20, 2020  2303 CT scan unremarkable.  Will p.o. trial [PS]  2352 Failed p.o. trial.  Not feeling any better, vomiting forcefully.  Will give additional antiemetics and IV fluids.  Will need to hospitalize for further management of intractable vomiting [PS]    Clinical Course User Index [PS] Carrie Mew, MD     ____________________________________________   FINAL CLINICAL IMPRESSION(S) / ED DIAGNOSES    Final diagnoses:  Generalized abdominal pain  Influenza-like illness  Atrial fibrillation, unspecified type ([place])  Chronic congestive heart failure, unspecified heart failure type ([place])  Type 2 diabetes mellitus without complication, without long-term current use of insulin [facility])     ED Discharge Orders          Ordered    ondansetron (ZOFRAN ODT) 4 MG disintegrating tablet  Every 8 hours PRN        06/20/20 2139    famotidine (PEPCID) 20 MG tablet  2 times daily        06/20/20 2139            Portions of this note were generated with dragon dictation software. Dictation errors may occur despite best attempts at proofreading.   Carrie Mew, MD 06/20/20 2149

## 2020-06-21 ENCOUNTER — Other Ambulatory Visit: Payer: Self-pay

## 2020-06-21 ENCOUNTER — Encounter: Payer: Self-pay | Admitting: Family Medicine

## 2020-06-21 DIAGNOSIS — E876 Hypokalemia: Secondary | ICD-10-CM | POA: Diagnosis not present

## 2020-06-21 DIAGNOSIS — K529 Noninfective gastroenteritis and colitis, unspecified: Secondary | ICD-10-CM

## 2020-06-21 DIAGNOSIS — R112 Nausea with vomiting, unspecified: Secondary | ICD-10-CM | POA: Diagnosis present

## 2020-06-21 DIAGNOSIS — I509 Heart failure, unspecified: Secondary | ICD-10-CM | POA: Diagnosis not present

## 2020-06-21 DIAGNOSIS — E785 Hyperlipidemia, unspecified: Secondary | ICD-10-CM

## 2020-06-21 DIAGNOSIS — R1084 Generalized abdominal pain: Secondary | ICD-10-CM | POA: Diagnosis not present

## 2020-06-21 DIAGNOSIS — I4891 Unspecified atrial fibrillation: Secondary | ICD-10-CM | POA: Diagnosis not present

## 2020-06-21 DIAGNOSIS — I110 Hypertensive heart disease with heart failure: Secondary | ICD-10-CM | POA: Diagnosis not present

## 2020-06-21 DIAGNOSIS — E119 Type 2 diabetes mellitus without complications: Secondary | ICD-10-CM | POA: Diagnosis not present

## 2020-06-21 LAB — URIC ACID: Uric Acid, Serum: 2.8 mg/dL (ref 2.5–7.1)

## 2020-06-21 LAB — CBC
HCT: 38.3 % (ref 36.0–46.0)
Hemoglobin: 11.2 g/dL — ABNORMAL LOW (ref 12.0–15.0)
MCH: 23 pg — ABNORMAL LOW (ref 26.0–34.0)
MCHC: 29.2 g/dL — ABNORMAL LOW (ref 30.0–36.0)
MCV: 78.5 fL — ABNORMAL LOW (ref 80.0–100.0)
Platelets: 262 10*3/uL (ref 150–400)
RBC: 4.88 MIL/uL (ref 3.87–5.11)
RDW: 16.7 % — ABNORMAL HIGH (ref 11.5–15.5)
WBC: 11.8 10*3/uL — ABNORMAL HIGH (ref 4.0–10.5)
nRBC: 0 % (ref 0.0–0.2)

## 2020-06-21 LAB — BASIC METABOLIC PANEL
Anion gap: 6 (ref 5–15)
BUN: 24 mg/dL — ABNORMAL HIGH (ref 8–23)
CO2: 26 mmol/L (ref 22–32)
Calcium: 8.6 mg/dL — ABNORMAL LOW (ref 8.9–10.3)
Chloride: 105 mmol/L (ref 98–111)
Creatinine, Ser: 0.96 mg/dL (ref 0.44–1.00)
GFR, Estimated: >60 mL/min (ref >60)
Glucose, Bld: 134 mg/dL — ABNORMAL HIGH (ref 70–99)
Potassium: 3.9 mmol/L (ref 3.5–5.1)
Sodium: 137 mmol/L (ref 135–145)

## 2020-06-21 LAB — URINALYSIS, COMPLETE (UACMP) WITH MICROSCOPIC
Bacteria, UA: NONE SEEN
Bilirubin Urine: NEGATIVE
Glucose, UA: >=500 mg/dL — AB
Ketones, ur: 20 mg/dL — AB
Nitrite: NEGATIVE
Protein, ur: 100 mg/dL — AB
Specific Gravity, Urine: 1.031 — ABNORMAL HIGH (ref 1.005–1.030)
pH: 5 (ref 5.0–8.0)

## 2020-06-21 LAB — GLUCOSE, CAPILLARY
Glucose-Capillary: 95 mg/dL (ref 70–99)
Glucose-Capillary: 117 mg/dL — ABNORMAL HIGH (ref 70–99)

## 2020-06-21 LAB — CBG MONITORING, ED: Glucose-Capillary: 125 mg/dL — ABNORMAL HIGH (ref 70–99)

## 2020-06-21 LAB — MAGNESIUM: Magnesium: 1.8 mg/dL (ref 1.7–2.4)

## 2020-06-21 MED ORDER — ONDANSETRON HCL 4 MG/2ML IJ SOLN
4.0000 mg | Freq: Four times a day (QID) | INTRAMUSCULAR | Status: DC | PRN
  Administered 2020-06-21: 4 mg via INTRAVENOUS
  Filled 2020-06-21: qty 2

## 2020-06-21 MED ORDER — MAGNESIUM HYDROXIDE 400 MG/5ML PO SUSP: 30.0000 mL | Freq: Every day | ORAL | Status: DC | PRN

## 2020-06-21 MED ORDER — ACETAMINOPHEN 325 MG PO TABS: 650.0000 mg | ORAL_TABLET | Freq: Four times a day (QID) | ORAL | Status: DC | PRN

## 2020-06-21 MED ORDER — NITROGLYCERIN 0.4 MG SL SUBL: 0.4000 mg | SUBLINGUAL_TABLET | SUBLINGUAL | Status: DC | PRN

## 2020-06-21 MED ORDER — ACETAMINOPHEN 650 MG RE SUPP
650.0000 mg | Freq: Four times a day (QID) | RECTAL | Status: DC | PRN
Start: 2020-06-21 — End: 2020-06-24

## 2020-06-21 MED ORDER — METOPROLOL TARTRATE 50 MG PO TABS
50.0000 mg | ORAL_TABLET | Freq: Two times a day (BID) | ORAL | Status: DC
  Administered 2020-06-21 – 2020-06-24 (×7): 50 mg via ORAL
  Filled 2020-06-21 (×7): qty 1

## 2020-06-21 MED ORDER — INSULIN ASPART 100 UNIT/ML IJ SOLN
0.0000 [IU] | INTRAMUSCULAR | Status: DC
  Administered 2020-06-22: 1 [IU] via SUBCUTANEOUS
  Filled 2020-06-21 (×2): qty 1

## 2020-06-21 MED ORDER — ONDANSETRON 4 MG PO TBDP: 4.0000 mg | ORAL_TABLET | Freq: Three times a day (TID) | ORAL | Status: DC | PRN

## 2020-06-21 MED ORDER — MIDODRINE HCL 5 MG PO TABS
2.5000 mg | ORAL_TABLET | Freq: Three times a day (TID) | ORAL | Status: DC
  Administered 2020-06-21 – 2020-06-24 (×5): 2.5 mg via ORAL
  Filled 2020-06-21 (×3): qty 0.5
  Filled 2020-06-21 (×2): qty 1
  Filled 2020-06-21: qty 0.5
  Filled 2020-06-21 (×2): qty 1

## 2020-06-21 MED ORDER — PANTOPRAZOLE SODIUM 40 MG PO TBEC
40.0000 mg | DELAYED_RELEASE_TABLET | Freq: Every day | ORAL | Status: DC
  Administered 2020-06-21 – 2020-06-24 (×4): 40 mg via ORAL
  Filled 2020-06-21 (×4): qty 1

## 2020-06-21 MED ORDER — FLUOXETINE HCL 20 MG PO CAPS
20.0000 mg | ORAL_CAPSULE | Freq: Every day | ORAL | Status: DC
  Administered 2020-06-21 – 2020-06-24 (×4): 20 mg via ORAL
  Filled 2020-06-21 (×4): qty 1

## 2020-06-21 MED ORDER — ROSUVASTATIN CALCIUM 10 MG PO TABS
20.0000 mg | ORAL_TABLET | Freq: Every day | ORAL | Status: DC
  Administered 2020-06-21 – 2020-06-24 (×4): 20 mg via ORAL
  Filled 2020-06-21: qty 2
  Filled 2020-06-21: qty 1
  Filled 2020-06-21 (×2): qty 2

## 2020-06-21 MED ORDER — POTASSIUM CHLORIDE IN NACL 20-0.9 MEQ/L-% IV SOLN
INTRAVENOUS | Status: DC
  Filled 2020-06-21 (×6): qty 1000

## 2020-06-21 MED ORDER — GABAPENTIN 300 MG PO CAPS
300.0000 mg | ORAL_CAPSULE | Freq: Every day | ORAL | Status: DC
  Administered 2020-06-21 – 2020-06-22 (×3): 300 mg via ORAL
  Filled 2020-06-21 (×3): qty 1

## 2020-06-21 MED ORDER — HYDROCODONE-ACETAMINOPHEN 5-325 MG PO TABS
1.0000 | ORAL_TABLET | Freq: Three times a day (TID) | ORAL | Status: DC | PRN
Start: 2020-06-21 — End: 2020-06-24
  Administered 2020-06-21: 1 via ORAL
  Administered 2020-06-21 – 2020-06-22 (×3): 2 via ORAL
  Administered 2020-06-23: 1 via ORAL
  Administered 2020-06-23: 2 via ORAL
  Administered 2020-06-23: 1 via ORAL
  Administered 2020-06-24: 2 via ORAL
  Filled 2020-06-21: qty 1
  Filled 2020-06-21: qty 2
  Filled 2020-06-21: qty 1
  Filled 2020-06-21 (×3): qty 2
  Filled 2020-06-21: qty 1
  Filled 2020-06-21: qty 2

## 2020-06-21 MED ORDER — RIVAROXABAN 10 MG PO TABS: 10.0000 mg | ORAL_TABLET | Freq: Every day | ORAL | Status: DC

## 2020-06-21 MED ORDER — ALBUTEROL SULFATE HFA 108 (90 BASE) MCG/ACT IN AERS: 2.0000 | INHALATION_SPRAY | Freq: Four times a day (QID) | RESPIRATORY_TRACT | Status: DC | PRN

## 2020-06-21 MED ORDER — LISINOPRIL 5 MG PO TABS
2.5000 mg | ORAL_TABLET | Freq: Every day | ORAL | Status: DC
  Administered 2020-06-21 – 2020-06-24 (×4): 2.5 mg via ORAL
  Filled 2020-06-21 (×4): qty 1

## 2020-06-21 MED ORDER — CLOPIDOGREL BISULFATE 75 MG PO TABS
75.0000 mg | ORAL_TABLET | Freq: Every day | ORAL | Status: DC
  Administered 2020-06-21 – 2020-06-24 (×4): 75 mg via ORAL
  Filled 2020-06-21 (×4): qty 1

## 2020-06-21 MED ORDER — ONDANSETRON HCL 4 MG PO TABS
4.0000 mg | ORAL_TABLET | Freq: Four times a day (QID) | ORAL | Status: DC | PRN
  Administered 2020-06-23: 4 mg via ORAL
  Filled 2020-06-21: qty 1

## 2020-06-21 MED ORDER — RIVAROXABAN 20 MG PO TABS
20.0000 mg | ORAL_TABLET | Freq: Every day | ORAL | Status: DC
  Administered 2020-06-21 – 2020-06-23 (×3): 20 mg via ORAL
  Filled 2020-06-21 (×3): qty 1

## 2020-06-21 MED ORDER — DAPAGLIFLOZIN PROPANEDIOL 5 MG PO TABS: 5.0000 mg | ORAL_TABLET | Freq: Every day | ORAL | Status: DC

## 2020-06-21 MED ORDER — TRAZODONE HCL 50 MG PO TABS
25.0000 mg | ORAL_TABLET | Freq: Every evening | ORAL | Status: DC | PRN
  Administered 2020-06-22: 25 mg via ORAL
  Filled 2020-06-21: qty 1

## 2020-06-21 NOTE — ED Notes (Signed)
MD at bedside. 

## 2020-06-21 NOTE — ED Notes (Signed)
This RN at bedside. Pt stating she is starting to feel nauseas again. Pt provided emesis bag and PRN antiemetic administered.

## 2020-06-21 NOTE — ED Notes (Signed)
Pt assisted to room bathroom via walker. Small amount of urine

## 2020-06-21 NOTE — Progress Notes (Signed)
Per patient's daughter - Dr. Nehemiah Massed stopped patient's midodrine at last visit.   Once patient arrived to the unit - evening dose was held

## 2020-06-21 NOTE — ED Notes (Addendum)
Pt Kayla Maxwell c/o of nausea and toe pain. MD made aware

## 2020-06-21 NOTE — ED Notes (Signed)
Pt provided meal tray

## 2020-06-21 NOTE — ED Notes (Addendum)
Pt stating she is having throbbing pain in her toe and needs pain medication. Pt offered tylenol but pt declined. Pt also stating she was able to eat graham crackers and drink water. This RN PO challenged pt and she was able to tolerate at this time. MD messaged.

## 2020-06-21 NOTE — ED Notes (Signed)
Pt assisted to bedside commode with walker.

## 2020-06-21 NOTE — Progress Notes (Signed)
Charge progress note.  Kayla Maxwell is a 75 y.o. Caucasian female with medical history significant for chronic medical problems that are mentioned below, who presented with nausea and vomiting with associated diarrhea without melena or bright red bleeding per rectum.  She admitted denies any fever or chills.  No dysuria, hematuria. CT abdomen without any significant abnormality, did show diverticulosis without diverticulitis.  Mild leukocytosis.  Admitted for gastroenteritis most likely viral. Started on IV hydration along with supportive care.  Patient was seen today.  Continues to have some nausea, stating that she is now able to tolerate some diet. Denies any diarrhea.  She was also complaining of pain in her left toes, no prior history of gout.  No obvious abnormality on exam.  Stating that her doctor told her that it is due to poor circulation and she uses Percocet at home which was restarted.  -Can check uric acid.  Pretty much benign exam and 1+ pedal pulses bilaterally.  -Continue with current management and monitor.

## 2020-06-21 NOTE — ED Notes (Signed)
Patient unable to keep food/water down. MD notified

## 2020-06-21 NOTE — H&P (Signed)
[name]   PATIENT NAME: Kayla Maxwell    MR#:  [id number]  DATE OF BIRTH:  Feb 15, 1945  DATE OF ADMISSION:  06/20/2020  PRIMARY CARE PHYSICIAN: Mar Daring, PA-C   Patient is coming from: Home  REQUESTING/REFERRING PHYSICIAN: Brenton Grills, MD  CHIEF COMPLAINT:   Chief Complaint  Patient presents with   Weakness    Pt arrives to ED from home via EMS c/o general malaise & weakness x 3-4 days, w/decrease in appetite & n/v. Pt seen at Urgent Care 1 day ago, neg Covid test there. Hx of MI in 02/22, also hx of afib. Alert & oriented x 4.    HISTORY OF PRESENT ILLNESS:  Kayla Maxwell is a 75 y.o. Caucasian female with medical history significant for chronic medical problems that are mentioned below, who presented with nausea and vomiting with associated diarrhea without melena or bright red bleeding per rectum.  She admitted denies any fever or chills.  No dysuria, hematuria or flank IV Protonix.  She admitted to the outpatient chest.  No dyspnea or dyspnea.  The patient was noted by EMS to be in atrial fibrillation with RVR with a rate of 90.  She was given IV Lopressor in the ER, her rate has been fairly controlled. ED Course: When she came to the ER blood pressure was 144/74 with a heart rate of 30 and respiratory to 22.  Labs revealed 3.22.  6.29. CBC showed leukocytosis 12.1 with neutrophilia. Antigens and COVID-19 PCR came back negative.  Imaging: Chest x-ray showed no acute cardiopulmonary disease. Abdominal pelvic CAT scan showed diverticulosis without diverticulitis.  The patient was given IV Reglan, IV lactated Ringer and normal saline, as well as 25 mg IV Benadryl, IV Zofran and Protonix.  She will be admitted to a progressive unit bed for further evaluation and management. PAST MEDICAL HISTORY:   Past Medical History:  Diagnosis Date   Acid reflux    takes Nexium daily   Allergy    takes Singulair daily as needed   Anemia    Atrial fibrillation  (HCC)    Cancer (HCC) AGE 16   CERVICAL cancer with removal   CHF (congestive heart failure) (HCC)    Chronic back pain    stenosis   Complication of anesthesia    Constipation    takes Colace daily as needed   Coronary artery disease    Difficult intubation    Dysrhythmia    atrial fibrillation dx 04/2014   Headache    sinus    History of blood transfusion    History of shingles    Hyperlipidemia    takes Pravastatin daily-started taking this 6 months ago   Hypertension    Lumbar surgical wound fluid collection    Sleep apnea    CPAP   Stroke ([place])    TIA'S X 2 --- one last yr and one this yr...lost her memory over 4-6 hrs.   Stroke [facility]) 06-04-15   left with right side weakness   TIA (transient ischemic attack)    showed up on an MRI but she never knew it   Urinary frequency     PAST SURGICAL HISTORY:   Past Surgical History:  Procedure Laterality Date   [place]  08/19/2014   Dr. Hal Neer ([place] otho)   [place]  2014   Dr Jamal Collin   COLONOSCOPY     CORONARY STENT INTERVENTION N/A 02/13/2020   Procedure: CORONARY STENT INTERVENTION;  Surgeon: Yolonda Kida, MD;  Location: [place] [facility];  Service: Cardiovascular;  Laterality: N/A;   ESOPHAGOGASTRODUODENOSCOPY     HAMMER TOE SURGERY Bilateral    LEFT HEART CATH AND CORONARY ANGIOGRAPHY N/A 02/13/2020   Procedure: LEFT HEART CATH AND CORONARY ANGIOGRAPHY and possible PCI and stent;  Surgeon: Yolonda Kida, MD;  Location: [place] [facility];  Service: Cardiovascular;  Laterality: N/A;   LOWER EXTREMITY ANGIOGRAPHY Left 01/23/2019   Procedure: LOWER EXTREMITY ANGIOGRAPHY;  Surgeon: Katha Cabal, MD;  Location: [place] [facility];  Service: Cardiovascular;  Laterality: Left;   LOWER EXTREMITY ANGIOGRAPHY Left 02/20/2019   Procedure: LOWER EXTREMITY ANGIOGRAPHY;  Surgeon:  Katha Cabal, MD;  Location: [place] [facility];  Service: Cardiovascular;  Laterality: Left;   LUMBAR LAMINECTOMY WITH COFLEX 1 LEVEL Bilateral 07/04/2014   Procedure: Laminectomy and Foraminotomy - bilateral - Lumbar Four-Five with coflex ;  Surgeon: Karie Chimera, MD;  Location: [place] [facility];  Service: Neurosurgery;  Laterality: Bilateral;   LUMBAR WOUND DEBRIDEMENT N/A 08/19/2014   Procedure: Exploration of Lumbar wound;  Surgeon: Karie Chimera, MD;  Location: [place] [facility];  Service: Neurosurgery;  Laterality: N/A;   PERIPHERAL VASCULAR CATHETERIZATION Left 08/12/2015   Procedure: Lower Extremity Angiography;  Surgeon: Katha Cabal, MD;  Location: [place] [facility];  Service: Cardiovascular;  Laterality: Left;   TONSILLECTOMY     VENTRAL HERNIA REPAIR N/A 05/26/2015   Procedure: HERNIA REPAIR VENTRAL ADULT;  Surgeon: Christene Lye, MD;  Location: [facility] ORS;  Service: General;  Laterality: N/A;    SOCIAL HISTORY:   Social History   Tobacco Use   Smoking status: Former    Packs/day: 1.00    Years: 47.00    Pack years: 47.00    Types: Cigarettes    Quit date: 08/19/2014    Years since quitting: 5.8   Smokeless tobacco: Former    Quit date: 08/19/2014  Substance Use Topics   Alcohol use: No    FAMILY HISTORY:   Family History  Problem Relation Age of Onset   Alzheimer's disease Mother    Heart attack Father    Pancreatic cancer Sister    Healthy Brother     DRUG ALLERGIES:   Allergies  Allergen Reactions   Codeine Other (See Comments)    Makes her feel "crazy"   Penicillins Swelling    Did it involve swelling of the face/tongue/throat, SOB, or low BP? Unknown Did it involve sudden or severe rash/hives, skin peeling, or any reaction on the inside of your mouth or nose? No Did you need to seek medical attention at a hospital or doctor's office? Unknown When did it last happen?      Childhood allergy If all above answers are "NO", may proceed with  cephalosporin use.    Latex    Adhesive [Tape] Hives    (Latex)   Sulfa Antibiotics Nausea Only and Rash    REVIEW OF SYSTEMS:   ROS As per history of present illness. All pertinent systems were reviewed above. Constitutional, HEENT, cardiovascular, respiratory, GI, GU, musculoskeletal, neuro, psychiatric, endocrine, integumentary and hematologic systems were reviewed and are otherwise negative/unremarkable except for positive findings mentioned above in the HPI.   MEDICATIONS AT HOME:   Prior to Admission medications   Medication Sig Start Date End Date Taking? Authorizing Provider  famotidine (PEPCID) 20 MG tablet Take 1 tablet (20 mg total) by mouth 2 (two) times daily. 06/20/20  Yes Carrie Mew, MD  ondansetron (ZOFRAN ODT) 4 MG disintegrating tablet Take 1 tablet (4 mg total) by mouth every 8 (eight) hours as needed for nausea or vomiting. 06/20/20  Yes Carrie Mew, MD  albuterol (PROVENTIL HFA;VENTOLIN HFA) 108 (90 Base) MCG/ACT inhaler Inhale 2 puffs into the lungs every 6 (six) hours as needed for wheezing or shortness of breath. 06/09/16   Mar Daring, PA-C  aspirin EC 81 MG EC tablet Take 1 tablet (81 mg total) by mouth daily. 06/08/15   Henreitta Leber, MD  dapagliflozin propanediol (FARXIGA) 5 MG TABS tablet Take 1 tablet (5 mg total) by mouth daily. 02/15/20   Lorella Nimrod, MD  FLUoxetine (PROZAC) 20 MG capsule TAKE 1 CAPSULE BY MOUTH EVERY DAY 02/29/20   Mar Daring, PA-C  furosemide (LASIX) 20 MG tablet Take 1 tablet (20 mg total) by mouth daily as needed for fluid or edema. 02/15/20   Lorella Nimrod, MD  gabapentin (NEURONTIN) 300 MG capsule TAKE 1 CAPSULE BY MOUTH EVERYDAY AT BEDTIME Patient taking differently: Take 300 mg by mouth at bedtime. 01/31/20   Kris Hartmann, NP  HYDROcodone-acetaminophen (NORCO) 5-325 MG tablet Take 1-2 tablets by mouth every 8 (eight) hours as needed for moderate pain or severe pain. 05/30/20   Virginia Crews, MD   lisinopril (ZESTRIL) 2.5 MG tablet TAKE 1 TABLET BY MOUTH EVERY DAY 02/29/20   Mar Daring, PA-C  metoprolol tartrate (LOPRESSOR) 25 MG tablet TAKE 2 TABLETS BY MOUTH TWICE A DAY 03/13/20   Mar Daring, PA-C  midodrine (PROAMATINE) 2.5 MG tablet Take 1 tablet (2.5 mg total) by mouth 3 (three) times daily with meals. 02/15/20   Lorella Nimrod, MD  nitroGLYCERIN (NITROSTAT) 0.4 MG SL tablet Place 1 tablet (0.4 mg total) under the tongue every 5 (five) minutes x 3 doses as needed for chest pain. 02/15/20   Lorella Nimrod, MD  omeprazole (PRILOSEC) 40 MG capsule TAKE 1 CAPSULE BY MOUTH EVERY DAY 03/03/20   Mar Daring, PA-C  rivaroxaban (XARELTO) 20 MG TABS tablet Take 20 mg by mouth daily with supper.  08/27/14   [provider]  rosuvastatin (CRESTOR) 20 MG tablet Take 1 tablet (20 mg total) by mouth daily. 02/15/20   Lorella Nimrod, MD  ticagrelor (BRILINTA) 90 MG TABS tablet Take 1 tablet (90 mg total) by mouth 2 (two) times daily. 02/15/20   Lorella Nimrod, MD      VITAL SIGNS:  Blood pressure (!) 143/79, pulse 85, resp. rate (!) 24, height 5\' 5"  (1.651 m), weight 70.3 kg, SpO2 97 %.  PHYSICAL EXAMINATION:  Physical Exam  GENERAL:  75 y.o.-year-old Caucasian female patient lying in the bed with no acute distress.  EYES: Pupils equal, round, reactive to light and accommodation. No scleral icterus. Extraocular muscles intact.  HEENT: Head atraumatic, normocephalic. Oropharynx and nasopharynx clear.  NECK:  Supple, no jugular venous distention. No thyroid enlargement, no tenderness.  LUNGS: Normal breath sounds bilaterally, no wheezing, rales,rhonchi or crepitation. No use of accessory muscles of respiration.  CARDIOVASCULAR: Regular rate and rhythm, S1, S2 normal. No murmurs, rubs, or gallops.  ABDOMEN: Soft, nondistended, nontender. Bowel sounds present. No organomegaly or mass.  EXTREMITIES: No pedal edema, cyanosis, or clubbing.  NEUROLOGIC: Cranial nerves II  through XII are intact. Muscle strength 5/5 in all extremities. Sensation intact. Gait not checked.  PSYCHIATRIC: The  patient is alert and oriented x 3.  Normal affect and good eye contact. SKIN: No obvious rash, lesion, or ulcer.   LABORATORY PANEL:   CBC Recent Labs  Lab 06/20/20 1955  WBC 12.4*  HGB 12.0  HCT 39.6  PLT 282   ------------------------------------------------------------------------------------------------------------------  Chemistries  Recent Labs  Lab 06/20/20 1955  NA 135  K 3.2*  CL 104  CO2 24  GLUCOSE 154*  BUN 23  CREATININE 1.04*  CALCIUM 8.7*  AST 19  ALT 17  ALKPHOS 66  BILITOT 1.3*   ------------------------------------------------------------------------------------------------------------------  Cardiac Enzymes No results for input(s): TROPONINI in the last 168 hours. ------------------------------------------------------------------------------------------------------------------  RADIOLOGY:  CT ABDOMEN PELVIS WO CONTRAST  Result Date: 06/20/2020 CLINICAL DATA:  Nausea and vomiting EXAM: CT ABDOMEN AND PELVIS WITHOUT CONTRAST TECHNIQUE: Multidetector CT imaging of the abdomen and pelvis was performed following the standard protocol without IV contrast. COMPARISON:  06/29/2017 FINDINGS: Lower chest: No acute abnormality.Cardiomegaly is noted. Hepatobiliary: No focal liver abnormality is seen. Status post cholecystectomy. No biliary dilatation. Pancreas: Unremarkable. No pancreatic ductal dilatation or surrounding inflammatory changes. Spleen: Normal in size without focal abnormality. Adrenals/Urinary Tract: Adrenal glands are within normal limits. The kidneys are well visualized. A punctate calculus is noted in the midportion of the right kidney. No obstructive changes are seen. Bilateral renal cysts are noted. These are stable from the prior exam. Bladder is partially distended. Stomach/Bowel: Scattered diverticular change of the colon is  noted without evidence of diverticulitis. No obstructive or inflammatory changes are seen. The appendix has been surgically removed. Small bowel and stomach appear unremarkable. Vascular/Lymphatic: Vascular calcifications are noted. Bilateral iliac stenting is seen. No aneurysmal dilatation is noted. No significant adenopathy is noted. Reproductive: Status post hysterectomy. No adnexal masses. Other: No abdominal wall hernia or abnormality. No abdominopelvic ascites. Musculoskeletal: Degenerative changes of the lumbar spine are seen. No acute bony abnormality is noted. IMPRESSION: Diverticulosis without diverticulitis. Chronic changes similar to that seen on prior exam. No acute abnormality to correspond with the given clinical history is seen. Electronically Signed   By: Inez Catalina M.D.   On: 06/20/2020 22:06   DG Chest Portable 1 View  Result Date: 06/20/2020 CLINICAL DATA:  Short of breath, fatigue EXAM: PORTABLE CHEST 1 VIEW COMPARISON:  02/12/2020 FINDINGS: 2 frontal views of the chest demonstrate an unremarkable cardiac silhouette. Stable atherosclerosis of the aortic arch. No airspace disease, effusion, or pneumothorax. No acute bony abnormality. IMPRESSION: 1. No acute intrathoracic process. Electronically Signed   By: Randa Ngo M.D.   On: 06/20/2020 20:17      IMPRESSION AND PLAN:  Active Problems:   Intractable nausea and vomiting  1.  Acute gastroenteritis possibly viral. - The patient will be admitted to an observation progressive unit bed. - We will continue hydration with IV normal saline.  She will be placed on as needed antiemetics. - We will place on clear diet and we will advance as tolerated.  2.  Atrial fibrillation with rapid ventricular response. - The patient is currently controlled ventricular response. - We will continue her Xarelto.  3.  Hypokalemia. - Potassium will be replaced and magnesium level will be checked.  4.  Dyslipidemia. - Continue statin  therapy.  5.  Coronary artery disease. - Continue Lopressor and Plavix. . 6.  Essential hypertension. - We will continue aspirin.  7.  Depression. - Will continue Prozac.  8.  Type II diabetes mellitus. - We will continue [place].  DVT prophylaxis: Xarelto Code Status:  Discussed with the patient and she desires to be DNR/DNR.   Family Communication:  The plan of care was discussed in details with the patient (and family). I answered all questions. The patient agreed to proceed with the above mentioned plan. Further management will depend upon hospital course. Disposition Plan: Back to previous home environment Consults called: none. All the records are reviewed and case discussed with ED provider.  Status is: Observation  The patient remains OBS appropriate and will d/c before 2 midnights.  Dispo: The patient is from: Home              Anticipated d/c is to: Home              Patient currently is not medically stable to d/c.   Difficult to place patient No   TOTAL TIME TAKING CARE OF THIS PATIENT:55 minutes.    Christel Mormon M.D on 06/21/2020 at 12:24 AM  [facility]   From 7 PM-7 AM, contact night-coverage [email or web address]  CC: Primary care physician; Mar Daring, PA-C

## 2020-06-22 ENCOUNTER — Encounter: Payer: Self-pay | Admitting: Family Medicine

## 2020-06-22 DIAGNOSIS — R112 Nausea with vomiting, unspecified: Secondary | ICD-10-CM | POA: Diagnosis not present

## 2020-06-22 DIAGNOSIS — E119 Type 2 diabetes mellitus without complications: Secondary | ICD-10-CM

## 2020-06-22 DIAGNOSIS — I4891 Unspecified atrial fibrillation: Secondary | ICD-10-CM | POA: Diagnosis not present

## 2020-06-22 LAB — GLUCOSE, CAPILLARY
Glucose-Capillary: 104 mg/dL — ABNORMAL HIGH (ref 70–99)
Glucose-Capillary: 108 mg/dL — ABNORMAL HIGH (ref 70–99)
Glucose-Capillary: 109 mg/dL — ABNORMAL HIGH (ref 70–99)
Glucose-Capillary: 128 mg/dL — ABNORMAL HIGH (ref 70–99)
Glucose-Capillary: 97 mg/dL (ref 70–99)
Glucose-Capillary: 97 mg/dL (ref 70–99)
Glucose-Capillary: 98 mg/dL (ref 70–99)

## 2020-06-22 NOTE — Evaluation (Signed)
Physical Therapy Evaluation Patient Details Name: Kayla Maxwell MRN: [id number] DOB: [birth year] Today's Date: 06/22/2020   History of Present Illness  Pt is a 75 y/o F admitted on 06/20/20 with c/c of N&V with diarrhea. Pt was noted by EMS to be in a-fib with RVR. Pt being treated for acute gastroenteritis & a-fib with RVR. PMH: a-fib, cervical CA, CHF, chronic back pain, HA, HLD, HTN, sleep apnea, stroke, TIAs  Clinical Impression  Pt seen for PT evaluation with pt endorsing nausea & meds requested. Pt also c/o dizziness with movement, orthostatics checked, see below. Pt requires supervision<>CGA for bed mobility with hospital bed features & mod/max assist for sit>stand at EOB. Pt demonstrates R lateral lean in sitting & standing with decreased ability to correct. Pt limited with further mobility 2/2 nausea; pt also declines bed level exercises for same reason. Pt would benefit from STR upon d/c to maximize independence with functional mobility & reduce fall risk prior to return home.  BP checked in RUE: Supine: 110/62 mmHg (MAP 77), HR 78 bpm Sitting: 109/62 mmHg (MAP 72), HR 76 bpm Standing at 0: 106/37 mmHg (MAP 56), HR 82 bpm Returned to supine: 123/69 mmHg (MAP 84), HR 85 bpm      Follow Up Recommendations SNF    Equipment Recommendations   (TBD in next venue)    Recommendations for Other Services       Precautions / Restrictions Precautions Precautions: Fall Restrictions Weight Bearing Restrictions: No      Mobility  Bed Mobility Overal bed mobility: Needs Assistance Bed Mobility: Supine to Sit;Sit to Supine     Supine to sit: Supervision;HOB elevated Sit to supine: Min guard;HOB elevated   General bed mobility comments: extra time to complete movement    Transfers Overall transfer level: Needs assistance Equipment used: Rolling walker (2 wheeled) Transfers: Sit to/from Stand Sit to Stand: Mod assist;Max assist         General transfer comment: sit<>stand  from EOB with cuing for hand placement  Ambulation/Gait                Stairs            Wheelchair Mobility    Modified Rankin (Stroke Patients Only)       Balance Overall balance assessment: Needs assistance Sitting-balance support: Feet supported;Bilateral upper extremity supported Sitting balance-Leahy Scale: Poor   Postural control: Right lateral lean   Standing balance-Leahy Scale: Zero Standing balance comment: max assist static standing balance with pt demonstrating R lateral lean with decreased ability to correct                             Pertinent Vitals/Pain Pain Assessment: Faces Faces Pain Scale: Hurts a little bit Pain Location: toes on B feet, redness noted Pain Descriptors / Indicators: Sore Pain Intervention(s): Monitored during session    Home Living Family/patient expects to be discharged to:: Private residence Living Arrangements: Alone Available Help at Discharge: Family;Available PRN/intermittently Type of Home: House Home Access: Stairs to enter Entrance Stairs-Rails: None Entrance Stairs-Number of Steps: threshold step Home Layout: One level Home Equipment: [place] - 2 wheels;Cane - single point      Prior Function Level of Independence: Independent         Comments: Pt reports being independent without AD, no falls, doesn't drive. Daughter lives 2 houses down & can assist PRN.     Hand Dominance  Extremity/Trunk Assessment   Upper Extremity Assessment Upper Extremity Assessment: Generalized weakness    Lower Extremity Assessment Lower Extremity Assessment: Generalized weakness       Communication   Communication: No difficulties  Cognition Arousal/Alertness: Awake/alert Behavior During Therapy: WFL for tasks assessed/performed Overall Cognitive Status: Within Functional Limits for tasks assessed                                        General Comments General comments  (skin integrity, edema, etc.): Pt declines performing bed level exercises 2/2 fatigue & not feeling well. Pt c/o nausea & meds requested.    Exercises     Assessment/Plan    PT Assessment Patient needs continued PT services  PT Problem List Decreased strength;Decreased mobility;Decreased safety awareness;Decreased activity tolerance;Decreased balance;Decreased knowledge of use of DME;Pain       PT Treatment Interventions DME instruction;Therapeutic exercise;Gait training;Balance training;Manual techniques;Stair training;Neuromuscular re-education;Functional mobility training;Therapeutic activities;Patient/family education    PT Goals (Current goals can be found in the Care Plan section)  Acute Rehab PT Goals Patient Stated Goal: feel better PT Goal Formulation: With patient Time For Goal Achievement: 07/06/20 Potential to Achieve Goals: Good    Frequency Min 2X/week   Barriers to discharge Decreased caregiver support lives alone    Co-evaluation               AM-PAC PT "6 Clicks" Mobility  Outcome Measure Help needed turning from your back to your side while in a flat bed without using bedrails?: A Little Help needed moving from lying on your back to sitting on the side of a flat bed without using bedrails?: A Little Help needed moving to and from a bed to a chair (including a wheelchair)?: Total Help needed standing up from a chair using your arms (e.g., wheelchair or bedside chair)?: A Lot Help needed to walk in hospital room?: Total Help needed climbing 3-5 steps with a railing? : Total 6 Click Score: 11    End of Session Equipment Utilized During Treatment: Gait belt Activity Tolerance: Patient limited by fatigue (limited by nausea) Patient left: in bed;with call bell/phone within reach;with bed alarm set Nurse Communication: Mobility status PT Visit Diagnosis: Difficulty in walking, not elsewhere classified (R26.2);Muscle weakness (generalized)  (M62.81);Unsteadiness on feet (R26.81)    Time: 6789-3810 PT Time Calculation (min) (ACUTE ONLY): 19 min   Charges:   PT Evaluation $PT Eval Moderate Complexity: [place], PT, DPT 06/22/20, 1:05 PM   Waunita Schooner 06/22/2020, 1:03 PM

## 2020-06-22 NOTE — Plan of Care (Signed)

## 2020-06-22 NOTE — Progress Notes (Signed)
PROGRESS NOTE    Kayla Maxwell  LSL:[id number] DOB: [birth year] DOA: 06/20/2020 PCP: Mar Daring, PA-C   Brief Narrative: Taken from prior notes. Kayla Maxwell is a 75 y.o. Caucasian female with medical history significant for chronic medical problems that are mentioned below, who presented with nausea and vomiting with associated diarrhea without melena or bright red bleeding per rectum.  She admitted denies any fever or chills.  No dysuria, hematuria. CT abdomen without any significant abnormality, did show diverticulosis without diverticulitis.  Mild leukocytosis.   Admitted for gastroenteritis most likely viral. Started on IV hydration along with supportive care.  Nausea vomiting resolved.  Able to tolerate diet.  She was feeling overall very weak and was requesting PT evaluation stating that she lives alone, PT is recommending SNF now.  Subjective: Patient was feeling very weak and requesting a physical therapy evaluation, stating that she lives alone and does not feel good enough to go home.  No more nausea and vomiting.  Tolerating clear liquid and asking for regular diet.  Assessment & Plan:   Active Problems:   Intractable nausea and vomiting  Gastritis most likely viral.  Nausea and vomiting resolved.  Tolerating diet well. -Advance diet -Discontinue IV fluid.  Generalized weakness.  Orthostatic vitals were negative. PT is recommending SNF placement. TOC consult  A. fib with RVR.  RVR resolved.  Rate well controlled now. -Continue with Xarelto -Continue with metoprolol.  Dyslipidemia. -Continue with statin.   Coronary artery disease.  Apparently patient was on aspirin, Plavix and Xarelto?? Aspirin was discontinued. -Continue with Plavix and Xarelto-patient needs to follow-up with her cardiologist for the need of continuation of Plavix with Xarelto. -Continue with metoprolol  Essential hypertension. Blood pressure within goal. -Continue with metoprolol  and lisinopril.  Next  Depression. -Continue home dose of Prozac  Diabetes mellitus. -SSI  Objective: Vitals:   06/22/20 0352 06/22/20 0839 06/22/20 0841 06/22/20 1138  BP: 135/78 124/71 124/71 108/68  Pulse: 86 87 87 91  Resp: 20  16 16   Temp:   97.8 F (36.6 C) 98.8 F (37.1 C)  TempSrc:   Oral Oral  SpO2: 95%  96% 93%  Weight:      Height:        Intake/Output Summary (Last 24 hours) at 06/22/2020 1329 Last data filed at 06/22/2020 0930 Gross per 24 hour  Intake 480 ml  Output 220 ml  Net 260 ml   Filed Weights   06/20/20 1938 06/21/20 1727  Weight: 70.3 kg 71.3 kg    Examination:  General exam: Appears calm and comfortable  Respiratory system: Clear to auscultation. Respiratory effort normal. Cardiovascular system: S1 & S2 heard, RRR. No JVD, murmurs, rubs, gallops or clicks. Gastrointestinal system: Soft, nontender, nondistended, bowel sounds positive. Central nervous system: Alert and oriented. No focal neurological deficits. Extremities: No edema, no cyanosis, pulses intact and symmetrical. Psychiatry: Judgement and insight appear normal.    DVT prophylaxis: Xarelto Code Status: DNR Family Communication: Called daughter with no response. Disposition Plan:  Status is: Observation  The patient remains OBS appropriate and will d/c before 2 midnights.  Dispo: The patient is from: Home              Anticipated d/c is to: SNF              Patient currently is medically stable to d/c.   Difficult to place patient No  Level of care: Progressive Cardiac  All the records are reviewed and case discussed with Care Management/Social Worker. Management plans discussed with the patient, nursing and they are in agreement.  Consultants:  None  Procedures:  Antimicrobials:   Data Reviewed: I have personally reviewed following labs and imaging studies  CBC: Recent Labs  Lab 06/20/20 1955 06/21/20 0500  WBC 12.4* 11.8*  NEUTROABS 10.6*  --    HGB 12.0 11.2*  HCT 39.6 38.3  MCV 78.0* 78.5*  PLT 282 956   Basic Metabolic Panel: Recent Labs  Lab 06/20/20 1955 06/21/20 0500 06/21/20 0847  NA 135 137  --   K 3.2* 3.9  --   CL 104 105  --   CO2 24 26  --   GLUCOSE 154* 134*  --   BUN 23 24*  --   CREATININE 1.04* 0.96  --   CALCIUM 8.7* 8.6*  --   MG  --   --  1.8   GFR: Estimated Creatinine Clearance: 50.1 mL/min (by C-G formula based on SCr of 0.96 mg/dL). Liver Function Tests: Recent Labs  Lab 06/20/20 1955  AST 19  ALT 17  ALKPHOS 66  BILITOT 1.3*  PROT 6.2*  ALBUMIN 3.5   Recent Labs  Lab 06/20/20 1955  LIPASE 87*   No results for input(s): AMMONIA in the last 168 hours. Coagulation Profile: No results for input(s): INR, PROTIME in the last 168 hours. Cardiac Enzymes: No results for input(s): CKTOTAL, CKMB, CKMBINDEX, TROPONINI in the last 168 hours. BNP (last 3 results) No results for input(s): PROBNP in the last 8760 hours. HbA1C: No results for input(s): HGBA1C in the last 72 hours. CBG: Recent Labs  Lab 06/21/20 2057 06/22/20 0001 06/22/20 0404 06/22/20 0839 06/22/20 1201  GLUCAP 117* 109* 104* 98 97   Lipid Profile: No results for input(s): CHOL, HDL, LDLCALC, TRIG, CHOLHDL, LDLDIRECT in the last 72 hours. Thyroid Function Tests: No results for input(s): TSH, T4TOTAL, FREET4, T3FREE, THYROIDAB in the last 72 hours. Anemia Panel: No results for input(s): VITAMINB12, FOLATE, FERRITIN, TIBC, IRON, RETICCTPCT in the last 72 hours. Sepsis Labs: No results for input(s): PROCALCITON, LATICACIDVEN in the last 168 hours.  Recent Results (from the past 240 hour(s))  Resp Panel by RT-PCR (Flu A&B, Covid) Nasopharyngeal Swab     Status: None   Collection Time: 06/20/20  7:55 PM   Specimen: Nasopharyngeal Swab; Nasopharyngeal(NP) swabs in vial transport medium  Result Value Ref Range Status   SARS Coronavirus 2 by RT PCR NEGATIVE NEGATIVE Final    Comment: (NOTE) SARS-CoV-2 target nucleic  acids are NOT DETECTED.  The SARS-CoV-2 RNA is generally detectable in upper respiratory specimens during the acute phase of infection. The lowest concentration of SARS-CoV-2 viral copies this assay can detect is 138 copies/mL. A negative result does not preclude SARS-Cov-2 infection and should not be used as the sole basis for treatment or other patient management decisions. A negative result may occur with  improper specimen collection/handling, submission of specimen other than nasopharyngeal swab, presence of viral mutation(s) within the areas targeted by this assay, and inadequate number of viral copies(<138 copies/mL). A negative result must be combined with clinical observations, patient history, and epidemiological information. The expected result is Negative.  Fact Sheet for Patients:  EntrepreneurPulse.com.au  Fact Sheet for Healthcare Providers:  IncredibleEmployment.be  This test is no t yet approved or cleared by the [place] FDA and  has been authorized for detection and/or diagnosis of SARS-CoV-2 by  FDA under an Emergency Use Authorization (EUA). This EUA will remain  in effect (meaning this test can be used) for the duration of the COVID-19 declaration under Section 564(b)(1) of the Act, 21 U.S.C.section 360bbb-3(b)(1), unless the authorization is terminated  or revoked sooner.       Influenza A by PCR NEGATIVE NEGATIVE Final   Influenza B by PCR NEGATIVE NEGATIVE Final    Comment: (NOTE) The Xpert Xpress SARS-CoV-2/FLU/RSV plus assay is intended as an aid in the diagnosis of influenza from Nasopharyngeal swab specimens and should not be used as a sole basis for treatment. Nasal washings and aspirates are unacceptable for Xpert Xpress SARS-CoV-2/FLU/RSV testing.  Fact Sheet for Patients: EntrepreneurPulse.com.au  Fact Sheet for Healthcare Providers: IncredibleEmployment.be  This  test is not yet approved or cleared by the [place] FDA and has been authorized for detection and/or diagnosis of SARS-CoV-2 by FDA under an Emergency Use Authorization (EUA). This EUA will remain in effect (meaning this test can be used) for the duration of the COVID-19 declaration under Section 564(b)(1) of the Act, 21 U.S.C. section 360bbb-3(b)(1), unless the authorization is terminated or revoked.  Performed at [facility], [address] [place], [place] [address]      Radiology Studies: CT ABDOMEN PELVIS WO CONTRAST  Result Date: 06/20/2020 CLINICAL DATA:  Nausea and vomiting EXAM: CT ABDOMEN AND PELVIS WITHOUT CONTRAST TECHNIQUE: Multidetector CT imaging of the abdomen and pelvis was performed following the standard protocol without IV contrast. COMPARISON:  06/29/2017 FINDINGS: Lower chest: No acute abnormality.Cardiomegaly is noted. Hepatobiliary: No focal liver abnormality is seen. Status post cholecystectomy. No biliary dilatation. Pancreas: Unremarkable. No pancreatic ductal dilatation or surrounding inflammatory changes. Spleen: Normal in size without focal abnormality. Adrenals/Urinary Tract: Adrenal glands are within normal limits. The kidneys are well visualized. A punctate calculus is noted in the midportion of the right kidney. No obstructive changes are seen. Bilateral renal cysts are noted. These are stable from the prior exam. Bladder is partially distended. Stomach/Bowel: Scattered diverticular change of the colon is noted without evidence of diverticulitis. No obstructive or inflammatory changes are seen. The appendix has been surgically removed. Small bowel and stomach appear unremarkable. Vascular/Lymphatic: Vascular calcifications are noted. Bilateral iliac stenting is seen. No aneurysmal dilatation is noted. No significant adenopathy is noted. Reproductive: Status post hysterectomy. No adnexal masses. Other: No abdominal wall hernia or abnormality. No  abdominopelvic ascites. Musculoskeletal: Degenerative changes of the lumbar spine are seen. No acute bony abnormality is noted. IMPRESSION: Diverticulosis without diverticulitis. Chronic changes similar to that seen on prior exam. No acute abnormality to correspond with the given clinical history is seen. Electronically Signed   By: Inez Catalina M.D.   On: 06/20/2020 22:06   DG Chest Portable 1 View  Result Date: 06/20/2020 CLINICAL DATA:  Short of breath, fatigue EXAM: PORTABLE CHEST 1 VIEW COMPARISON:  02/12/2020 FINDINGS: 2 frontal views of the chest demonstrate an unremarkable cardiac silhouette. Stable atherosclerosis of the aortic arch. No airspace disease, effusion, or pneumothorax. No acute bony abnormality. IMPRESSION: 1. No acute intrathoracic process. Electronically Signed   By: Randa Ngo M.D.   On: 06/20/2020 20:17    Scheduled Meds:  clopidogrel  75 mg Oral Daily   FLUoxetine  20 mg Oral Daily   gabapentin  300 mg Oral QHS   insulin aspart  0-9 Units Subcutaneous Q4H   lisinopril  2.5 mg Oral Daily   metoprolol tartrate  50 mg Oral BID   midodrine  2.5  mg Oral TID WC   pantoprazole  40 mg Oral Daily   rivaroxaban  20 mg Oral Q supper   rosuvastatin  20 mg Oral Daily   Continuous Infusions:  0.9 % NaCl with KCl 20 mEq / L 100 mL/hr at 06/22/20 1034     LOS: 0 days   Time spent: 35 minutes. More than 50% of the time was spent in counseling/coordination of care  Lorella Nimrod, MD [facility]  If 7PM-7AM, please contact night-coverage [email or web address]  06/22/2020, 1:29 PM   This record has been created using Systems analyst. Errors have been sought and corrected,but may not always be located. Such creation errors do not reflect on the standard of care.

## 2020-06-23 ENCOUNTER — Encounter: Payer: Self-pay | Admitting: Family Medicine

## 2020-06-23 ENCOUNTER — Observation Stay: Payer: Medicare Other

## 2020-06-23 DIAGNOSIS — J111 Influenza due to unidentified influenza virus with other respiratory manifestations: Secondary | ICD-10-CM

## 2020-06-23 DIAGNOSIS — R4182 Altered mental status, unspecified: Secondary | ICD-10-CM | POA: Diagnosis not present

## 2020-06-23 DIAGNOSIS — I4891 Unspecified atrial fibrillation: Secondary | ICD-10-CM | POA: Diagnosis not present

## 2020-06-23 DIAGNOSIS — R112 Nausea with vomiting, unspecified: Secondary | ICD-10-CM | POA: Diagnosis not present

## 2020-06-23 DIAGNOSIS — E119 Type 2 diabetes mellitus without complications: Secondary | ICD-10-CM | POA: Diagnosis not present

## 2020-06-23 DIAGNOSIS — R1084 Generalized abdominal pain: Secondary | ICD-10-CM | POA: Diagnosis not present

## 2020-06-23 LAB — GLUCOSE, CAPILLARY
Glucose-Capillary: 85 mg/dL (ref 70–99)
Glucose-Capillary: 94 mg/dL (ref 70–99)
Glucose-Capillary: 138 mg/dL — ABNORMAL HIGH (ref 70–99)
Glucose-Capillary: 117 mg/dL — ABNORMAL HIGH (ref 70–99)
Glucose-Capillary: 117 mg/dL — ABNORMAL HIGH (ref 70–99)

## 2020-06-23 LAB — HEMOGLOBIN A1C
Hgb A1c MFr Bld: 5.9 % — ABNORMAL HIGH (ref 4.8–5.6)
Mean Plasma Glucose: 123 mg/dL

## 2020-06-23 MED ORDER — GABAPENTIN 300 MG PO CAPS
300.0000 mg | ORAL_CAPSULE | Freq: Two times a day (BID) | ORAL | Status: DC
  Administered 2020-06-23 – 2020-06-24 (×3): 300 mg via ORAL
  Filled 2020-06-23 (×3): qty 1

## 2020-06-23 NOTE — Plan of Care (Signed)

## 2020-06-23 NOTE — Progress Notes (Signed)
Patient choked on morning medication; able to clear airway without intervention. Patient remains upright at 45 degrees. Dr. Reesa Chew aware, SLP consult placed.

## 2020-06-23 NOTE — Evaluation (Signed)
Clinical/Bedside Swallow Evaluation Patient Details  Name: Kayla Maxwell MRN: [id number] Date of Birth: 07/23/1945  Today's Date: 06/23/2020 Time: SLP Start Time (ACUTE ONLY): 1300 SLP Stop Time (ACUTE ONLY): 1348 SLP Time Calculation (min) (ACUTE ONLY): 48.22 min  Past Medical History:  Past Medical History:  Diagnosis Date   Acid reflux    takes Nexium daily   Allergy    takes Singulair daily as needed   Anemia    Atrial fibrillation (HCC)    Cancer (HCC) AGE 75   CERVICAL cancer with removal   CHF (congestive heart failure) (HCC)    Chronic back pain    stenosis   Complication of anesthesia    Constipation    takes Colace daily as needed   Coronary artery disease    Difficult intubation    Dysrhythmia    atrial fibrillation dx 04/2014   Headache    sinus    History of blood transfusion    History of shingles    Hyperlipidemia    takes Pravastatin daily-started taking this 6 months ago   Hypertension    Lumbar surgical wound fluid collection    Sleep apnea    CPAP   Stroke ([place])    TIA'S X 2 --- one last yr and one this yr...lost her memory over 4-6 hrs.   Stroke [facility]) 06-04-15   left with right side weakness   TIA (transient ischemic attack)    showed up on an MRI but she never knew it   Urinary frequency    Past Surgical History:  Past Surgical History:  Procedure Laterality Date   [place]  08/19/2014   Dr. Hal Neer ([place] otho)   [place]  2014   Dr Jamal Collin   COLONOSCOPY     CORONARY STENT INTERVENTION N/A 02/13/2020   Procedure: CORONARY STENT INTERVENTION;  Surgeon: Yolonda Kida, MD;  Location: [place] CV LAB;  Service: Cardiovascular;  Laterality: N/A;   ESOPHAGOGASTRODUODENOSCOPY     HAMMER TOE SURGERY Bilateral    LEFT HEART CATH AND CORONARY ANGIOGRAPHY N/A 02/13/2020   Procedure: LEFT HEART CATH AND CORONARY  ANGIOGRAPHY and possible PCI and stent;  Surgeon: Yolonda Kida, MD;  Location: [place] CV LAB;  Service: Cardiovascular;  Laterality: N/A;   LOWER EXTREMITY ANGIOGRAPHY Left 01/23/2019   Procedure: LOWER EXTREMITY ANGIOGRAPHY;  Surgeon: Katha Cabal, MD;  Location: [place] CV LAB;  Service: Cardiovascular;  Laterality: Left;   LOWER EXTREMITY ANGIOGRAPHY Left 02/20/2019   Procedure: LOWER EXTREMITY ANGIOGRAPHY;  Surgeon: Katha Cabal, MD;  Location: [place] CV LAB;  Service: Cardiovascular;  Laterality: Left;   LUMBAR LAMINECTOMY WITH COFLEX 1 LEVEL Bilateral 07/04/2014   Procedure: Laminectomy and Foraminotomy - bilateral - Lumbar Four-Five with coflex ;  Surgeon: Karie Chimera, MD;  Location: [place] NEURO ORS;  Service: Neurosurgery;  Laterality: Bilateral;   LUMBAR WOUND DEBRIDEMENT N/A 08/19/2014   Procedure: Exploration of Lumbar wound;  Surgeon: Karie Chimera, MD;  Location: [place] NEURO ORS;  Service: Neurosurgery;  Laterality: N/A;   PERIPHERAL VASCULAR CATHETERIZATION Left 08/12/2015   Procedure: Lower Extremity Angiography;  Surgeon: Katha Cabal, MD;  Location: [place] CV LAB;  Service: Cardiovascular;  Laterality: Left;   TONSILLECTOMY     VENTRAL HERNIA REPAIR N/A 05/26/2015   Procedure: HERNIA REPAIR VENTRAL ADULT;  Surgeon: Daryel Gerald  Robinette Haines, MD;  Location: [facility] ORS;  Service: General;  Laterality: N/A;   HPI:  per admitting H&P "Kayla Maxwell is a 75 y.o. Caucasian female with medical history significant for chronic medical problems that are mentioned below, who presented with nausea and vomiting with associated diarrhea without melena or bright red bleeding per rectum.  She admitted denies any fever or chills.  No dysuria, hematuria or flank IV Protonix.  She admitted to the outpatient chest.  No dyspnea or dyspnea.  The patient was noted by EMS to be in atrial fibrillation with RVR with a rate of 90.  She was given IV Lopressor in the ER, her  rate has been fairly controlled."   Assessment / Plan / Recommendation Clinical Impression  Pt presents with mild dysphagia mostly secondary to poor dentition. Pt had an episode of "choking" with a small pill this am thus warrenting a swallow evaluation. Oral mech exam revealed no bottom teeth with several upper teeth missing, Pt reports she can chew and swallow solids despite no bottom teeth. Pt tolerated purees, solids and thin liquids today without overt s/s of aspiration. Vocal quality remained clear and laryngeal elevation appeared adequate. Rec Dys 3 diet with thin liquids. Discussed need for taking pills one a  time with applesauce instead of water. ST to follow up with toleration of diet and toleration of meds and make further recomendatiojns if needed. SLP Visit Diagnosis: Dysphagia, oropharyngeal phase (R13.12)    Aspiration Risk  Mild aspiration risk    Diet Recommendation Dysphagia 3 (Mech soft)   Liquid Administration via: Cup;Straw Medication Administration: Whole meds with puree Supervision: Intermittent supervision to cue for compensatory strategies Compensations: Minimize environmental distractions;Slow rate;Small sips/bites Postural Changes: Remain upright for at least 30 minutes after po intake;Seated upright at 90 degrees    Other  Recommendations     Follow up Recommendations   NO ST needed at discharge     Frequency and Duration   Follow up x1-2         Prognosis    Good    Swallow Study   General Date of Onset: 06/20/20 HPI: per admitting H&P "Kayla Maxwell is a 75 y.o. Caucasian female with medical history significant for chronic medical problems that are mentioned below, who presented with nausea and vomiting with associated diarrhea without melena or bright red bleeding per rectum.  She admitted denies any fever or chills.  No dysuria, hematuria or flank IV Protonix.  She admitted to the outpatient chest.  No dyspnea or dyspnea.  The patient was noted by  EMS to be in atrial fibrillation with RVR with a rate of 90.  She was given IV Lopressor in the ER, her rate has been fairly controlled." Type of Study: Bedside Swallow Evaluation Diet Prior to this Study: Regular Temperature Spikes Noted: No Respiratory Status: Room air History of Recent Intubation: No Behavior/Cognition: Alert;Cooperative;Pleasant mood Oral Cavity Assessment: Within Functional Limits Oral Care Completed by SLP: No Oral Cavity - Dentition: Missing dentition;Poor condition Vision: Functional for self-feeding Self-Feeding Abilities: Able to feed self Patient Positioning: Upright in bed Baseline Vocal Quality: Normal    Oral/Motor/Sensory Function Overall Oral Motor/Sensory Function: Within functional limits   Ice Chips Ice chips: Within functional limits Presentation: Spoon   Thin Liquid Thin Liquid: Within functional limits Presentation: Cup;Spoon;Straw    Nectar Thick Nectar Thick Liquid: Not tested   Honey Thick Honey Thick Liquid: Not tested   Puree Puree: Within functional limits Presentation: Spoon  Solid     Solid: Impaired Presentation: Self Fed Oral Phase Impairments: Impaired mastication Oral Phase Functional Implications: Prolonged oral transit;Impaired mastication      Lucila Maine 06/23/2020,1:48 PM

## 2020-06-23 NOTE — NC FL2 (Signed)
[place] LEVEL OF CARE SCREENING TOOL     IDENTIFICATION  Patient Name: Kayla Maxwell Birthdate: [birth year] Sex: female Admission Date (Current Location): 06/20/2020  [facility] and [place] Number:  Engineering geologist and Address:  [facility], [address], [place], [place] [address]      Provider Number: [id number]  Attending Physician Name and Address:  Lorella Nimrod, MD  Relative Name and Phone Number:  [name] Alease [phone]    Current Level of Care: Hospital Recommended Level of Care: [place] Prior Approval Number:    Date Approved/Denied:   PASRR Number:   [id number]  Discharge Plan: SNF    Current Diagnoses: Patient Active Problem List   Diagnosis Date Noted   Type 2 diabetes mellitus without complication, without long-term current use of insulin ([place])    Intractable nausea and vomiting 06/21/2020   Myocardial infarction ([place]) 02/12/2020   Atheroembolism of foot, left ([place]) 01/11/2019   Venous insufficiency of both lower extremities 11/02/2017   Pain and swelling of lower leg 06/20/2017   Benign essential HTN 11/01/2016   SOBOE (shortness of breath on exertion) 11/01/2016   Gastroenteritis 08/16/2016   Bilateral carotid artery stenosis 03/12/2016   Fine tremor 03/12/2016   PAD (peripheral artery disease) ([place]) 01/14/2016   Atherosclerosis of native arteries of extremity with rest pain ([place]) 11/10/2015   Cough 06/09/2015   Abnormal blood sugar 04/11/2015   Anxiety 11/19/2014   PTSD (post-traumatic stress disorder) 10/29/2014   Allergic rhinitis 09/05/2014   Mixed hyperlipidemia 09/05/2014   Mild memory disturbance 09/05/2014   H/O stroke without residual deficits 09/05/2014   Edema 09/05/2014   Spinal stenosis, lumbar 07/04/2014   MI (mitral incompetence) 05/08/2014   TI (tricuspid incompetence) 05/08/2014   Atrial fibrillation ([place]) 05/08/2014   History of tobacco abuse 04/29/2014    Gastro-esophageal reflux disease without esophagitis 04/29/2014   Obstructive apnea 04/29/2014   Atrial fibrillation by electrocardiography ([place]) 04/29/2014   Neuritis or radiculitis due to rupture of lumbar intervertebral disc 09/21/2013   DDD (degenerative disc disease), lumbar 08/24/2013   Bursitis, trochanteric 06/18/2013   Nerve root inflammation 06/18/2013   Arthralgia of hip 06/04/2013   Calculus of gallbladder with other cholecystitis, without mention of obstruction 04/27/2012    Orientation RESPIRATION BLADDER Height & Weight     Self, Time, Situation, Place    Continent Weight: 157 lb 3.2 oz (71.3 kg) Height:  5\' 5"  (165.1 cm)  BEHAVIORAL SYMPTOMS/MOOD NEUROLOGICAL BOWEL NUTRITION STATUS      Incontinent Diet (see discharge summary)  AMBULATORY STATUS COMMUNICATION OF NEEDS Skin   Limited Assist Verbally Normal                       Personal Care Assistance Level of Assistance  Bathing, Dressing, Total care, Feeding Bathing Assistance: Limited assistance Feeding assistance: Independent Dressing Assistance: Limited assistance Total Care Assistance: Limited assistance   Functional Limitations Info  Sight, Hearing, Speech Sight Info: Adequate Hearing Info: Adequate Speech Info: Adequate    SPECIAL CARE FACTORS FREQUENCY  PT (By licensed PT), OT (By licensed OT)     PT Frequency: min 4x weekly OT Frequency: min 4x weekly            Contractures Contractures Info: Not present    Additional Factors Info  Code Status, Allergies Code Status Info: DNR Allergies Info: Codeine, Penicillins, Latex, Adhesive (tape), sulfa antibiotics  Current Medications (06/23/2020):  This is the current hospital active medication list Current Facility-Administered Medications  Medication Dose Route Frequency Provider Last Rate Last Admin   acetaminophen (TYLENOL) tablet 650 mg  650 mg Oral Q6H PRN Mansy, Jan A, MD       Or   acetaminophen (TYLENOL) suppository  650 mg  650 mg Rectal Q6H PRN Mansy, Jan A, MD       clopidogrel (PLAVIX) tablet 75 mg  75 mg Oral Daily Mansy, Jan A, MD   75 mg at 06/23/20 0944   FLUoxetine (PROZAC) capsule 20 mg  20 mg Oral Daily Mansy, Jan A, MD   20 mg at 06/23/20 0944   gabapentin (NEURONTIN) capsule 300 mg  300 mg Oral BID Lorella Nimrod, MD   300 mg at 06/23/20 1101   HYDROcodone-acetaminophen (NORCO/VICODIN) 5-325 MG per tablet 1-2 tablet  1-2 tablet Oral Q8H PRN Lorella Nimrod, MD   1 tablet at 06/23/20 1101   insulin aspart (novoLOG) injection 0-9 Units  0-9 Units Subcutaneous Q4H Kathryne Eriksson, NP   1 Units at 06/22/20 2107   lisinopril (ZESTRIL) tablet 2.5 mg  2.5 mg Oral Daily Mansy, Jan A, MD   2.5 mg at 06/23/20 0944   magnesium hydroxide (MILK OF MAGNESIA) suspension 30 mL  30 mL Oral Daily PRN Mansy, Jan A, MD       metoprolol tartrate (LOPRESSOR) tablet 50 mg  50 mg Oral BID Mansy, Jan A, MD   50 mg at 06/23/20 0944   midodrine (PROAMATINE) tablet 2.5 mg  2.5 mg Oral TID WC Mansy, Jan A, MD   2.5 mg at 06/23/20 1249   nitroGLYCERIN (NITROSTAT) SL tablet 0.4 mg  0.4 mg Sublingual Q5 Min x 3 PRN Mansy, Jan A, MD       ondansetron [facility]) tablet 4 mg  4 mg Oral Q6H PRN Mansy, Jan A, MD   4 mg at 06/23/20 0944   Or   ondansetron (ZOFRAN) injection 4 mg  4 mg Intravenous Q6H PRN Mansy, Jan A, MD   4 mg at 06/21/20 1055   pantoprazole (PROTONIX) EC tablet 40 mg  40 mg Oral Daily Mansy, Jan A, MD   40 mg at 06/23/20 0944   rivaroxaban (XARELTO) tablet 20 mg  20 mg Oral Q supper Mansy, Jan A, MD   20 mg at 06/22/20 1828   rosuvastatin (CRESTOR) tablet 20 mg  20 mg Oral Daily Mansy, Jan A, MD   20 mg at 06/23/20 0944   traZODone (DESYREL) tablet 25 mg  25 mg Oral QHS PRN Mansy, Jan A, MD   25 mg at 06/22/20 2108     Discharge Medications: Please see discharge summary for a list of discharge medications.  Relevant Imaging Results:  Relevant Lab Results:   Additional Information SSN: [id number]  Alberteen Sam, LCSW

## 2020-06-23 NOTE — TOC Initial Note (Addendum)
Transition of Care [facility]) - Initial/Assessment Note    Patient Details  Name: Kayla Maxwell MRN: [id number] Date of Birth: Dec 12, 1945  [facility] of [facility]) CM/SW Contact:    Alberteen Sam, LCSW Phone Number: 06/23/2020, 3:53 PM  Clinical Narrative:                  Update: [facility] should be able to accept patient tomorrow 06/24/20, MD and patient daughter updated.      CSW spoke with patient regarding SNF recommendation, agreeable to SNF and reports her daughter Kayla Maxwell (also called Kayla Maxwell) would know of a preference of facility.   CSW spoke with Kayla Maxwell who reports preference is [facility] in [place] or [facility].  CSW has sent referrals pending bed offers.   Expected Discharge Plan: Skilled Nursing Facility Barriers to Discharge: SNF Pending bed offer   Patient Goals and CMS Choice Patient states their goals for this hospitalization and ongoing recovery are:: to go to SNF CMS Medicare.gov Compare Post Acute Care list provided to:: Patient Choice offered to / list presented to : Patient  Expected Discharge Plan and Services Expected Discharge Plan: [place] Acute Care Choice: [place] arrangements for the past 2 months: Single Family Home                                      Prior Living Arrangements/Services Living arrangements for the past 2 months: Single Family Home Lives with:: Self Patient language and need for interpreter reviewed:: Yes Do you feel safe going back to the place where you live?: No   needs short term rehab  Need for Family Participation in Patient Care: Yes (Comment) Care giver support system in place?: Yes (comment)   Criminal Activity/Legal Involvement Pertinent to Current Situation/Hospitalization: No - Comment as needed  Activities of Daily Living Home Assistive Devices/Equipment: Shower chair without back, Blood pressure cuff ADL Screening (condition at time of  admission) Patient's cognitive ability adequate to safely complete daily activities?: Yes Is the patient deaf or have difficulty hearing?: No Does the patient have difficulty seeing, even when wearing glasses/contacts?: No Does the patient have difficulty concentrating, remembering, or making decisions?: No Patient able to express need for assistance with ADLs?: Yes Does the patient have difficulty dressing or bathing?: No Independently performs ADLs?: Yes (appropriate for developmental age) Does the patient have difficulty walking or climbing stairs?: Yes Weakness of Legs: Both Weakness of Arms/Hands: None  Permission Sought/Granted Permission sought to share information with : Case Manager, Customer service manager, Family Supports Permission granted to share information with : Yes, Verbal Permission Granted  Share Information with NAME: Kayla Maxwell (Kayla Maxwell)  Permission granted to share info w AGENCY: SNFs  Permission granted to share info w Relationship: daughter  Permission granted to share info w Contact Information: [phone]  Emotional Assessment Appearance:: Appears stated age Attitude/Demeanor/Rapport: Gracious Affect (typically observed): Calm Orientation: : Oriented to Self, Oriented to Place, Oriented to  Time, Oriented to Situation Alcohol / Substance Use: Not Applicable Psych Involvement: No (comment)  Admission diagnosis:  Generalized abdominal pain [R10.84] Influenza-like illness [J11.1] Intractable nausea and vomiting [R11.2] Type 2 diabetes mellitus without complication, without long-term current use of insulin (HCC) [E11.9] Atrial fibrillation, unspecified type ([place]) [I48.91] Chronic congestive heart failure, unspecified heart failure type [facility]) [I50.9] Patient Active Problem List   Diagnosis Date Noted  Type 2 diabetes mellitus without complication, without long-term current use of insulin (HCC)    Intractable nausea and vomiting 06/21/2020    Myocardial infarction ([place]) 02/12/2020   Atheroembolism of foot, left ([place]) 01/11/2019   Venous insufficiency of both lower extremities 11/02/2017   Pain and swelling of lower leg 06/20/2017   Benign essential HTN 11/01/2016   SOBOE (shortness of breath on exertion) 11/01/2016   Gastroenteritis 08/16/2016   Bilateral carotid artery stenosis 03/12/2016   Fine tremor 03/12/2016   PAD (peripheral artery disease) ([place]) 01/14/2016   Atherosclerosis of native arteries of extremity with rest pain ([place]) 11/10/2015   Cough 06/09/2015   Abnormal blood sugar 04/11/2015   Anxiety 11/19/2014   PTSD (post-traumatic stress disorder) 10/29/2014   Allergic rhinitis 09/05/2014   Mixed hyperlipidemia 09/05/2014   Mild memory disturbance 09/05/2014   H/O stroke without residual deficits 09/05/2014   Edema 09/05/2014   Spinal stenosis, lumbar 07/04/2014   MI (mitral incompetence) 05/08/2014   TI (tricuspid incompetence) 05/08/2014   Atrial fibrillation ([place]) 05/08/2014   History of tobacco abuse 04/29/2014   Gastro-esophageal reflux disease without esophagitis 04/29/2014   Obstructive apnea 04/29/2014   Atrial fibrillation by electrocardiography ([place]) 04/29/2014   Neuritis or radiculitis due to rupture of lumbar intervertebral disc 09/21/2013   DDD (degenerative disc disease), lumbar 08/24/2013   Bursitis, trochanteric 06/18/2013   Nerve root inflammation 06/18/2013   Arthralgia of hip 06/04/2013   Calculus of gallbladder with other cholecystitis, without mention of obstruction 04/27/2012   PCP:  Mar Daring, PA-C Pharmacy:   [id number] [place], [place] - [address] [place] [address] Phone: [phone] Fax: [phone]     Social Determinants of Health (SDOH) Interventions    Readmission Risk Interventions No flowsheet data found.

## 2020-06-23 NOTE — Progress Notes (Signed)
PROGRESS NOTE    Kayla Maxwell  ZHG:[id number] DOB: 25-Oct-1945 DOA: 06/20/2020 PCP: Mar Daring, PA-C   Brief Narrative: Taken from prior notes. Kayla Maxwell is a 75 y.o. Caucasian female with medical history significant for chronic medical problems that are mentioned below, who presented with nausea and vomiting with associated diarrhea without melena or bright red bleeding per rectum.  She admitted denies any fever or chills.  No dysuria, hematuria. CT abdomen without any significant abnormality, did show diverticulosis without diverticulitis.  Mild leukocytosis.   Admitted for gastroenteritis most likely viral. Started on IV hydration along with supportive care.  Nausea vomiting resolved.  Able to tolerate diet.  She was feeling overall very weak and was requesting PT evaluation stating that she lives alone, PT is recommending SNF now.  Subjective: Patient was complaining of left foot pain, that her neuropathy is very bothersome for her.  We discussed about increasing the dose of Neurontin-she agrees but also states that sometimes Neurontin makes her very sleepy.  Assessment & Plan:   Active Problems:   Atrial fibrillation (HCC)   Intractable nausea and vomiting   Type 2 diabetes mellitus without complication, without long-term current use of insulin (HCC)  Gastritis most likely viral.  Nausea and vomiting resolved.  Tolerating diet well.  Generalized weakness.  Orthostatic vitals were negative. PT is recommending SNF placement. TOC consult-most likely will be able to go tomorrow, COVID test ordered  Foot pain.  Most likely neuropathy.  As uric acid was low. -Increase Neurontin to twice daily.  A. fib with RVR.  RVR resolved.  Rate well controlled now. -Continue with Xarelto -Continue with metoprolol.  Dyslipidemia. -Continue with statin.   Coronary artery disease.  Apparently patient was on aspirin, Plavix and Xarelto?? Aspirin was discontinued. -Continue  with Plavix and Xarelto-patient needs to follow-up with her cardiologist for the need of continuation of Plavix with Xarelto. -Continue with metoprolol  Essential hypertension. Blood pressure within goal. -Continue with metoprolol and lisinopril.  Next  Depression. -Continue home dose of Prozac  Diabetes mellitus. -SSI  Objective: Vitals:   06/23/20 0552 06/23/20 0724 06/23/20 1155 06/23/20 1617  BP:  108/62 (!) 106/51 (!) 110/47  Pulse: 90 77 82 64  Resp:  20 18 18   Temp:  99.3 F (37.4 C) 99 F (37.2 C) 98.5 F (36.9 C)  TempSrc:   Oral   SpO2: 98% 95% 94% 93%  Weight:      Height:        Intake/Output Summary (Last 24 hours) at 06/23/2020 1649 Last data filed at 06/23/2020 1400 Gross per 24 hour  Intake 1465.88 ml  Output 101 ml  Net 1364.88 ml    Filed Weights   06/20/20 1938 06/21/20 1727  Weight: 70.3 kg 71.3 kg    Examination:  General.  Frail elderly lady, in no acute distress. Pulmonary.  Lungs clear bilaterally, normal respiratory effort. CV.  Regular rate and rhythm, no JVD, rub or murmur. Abdomen.  Soft, nontender, nondistended, BS positive. CNS.  Alert and oriented x3.  No focal neurologic deficit. Extremities.  No edema, no cyanosis, pulses intact and symmetrical. Psychiatry.  Judgment and insight appears normal.   DVT prophylaxis: Xarelto Code Status: DNR Family Communication: Daughter was updated. Disposition Plan:  Status is: Observation  The patient remains OBS appropriate and will d/c before 2 midnights.  Dispo: The patient is from: Home              Anticipated d/c is  to: SNF              Patient currently is medically stable to d/c.   Difficult to place patient No              Level of care: Progressive Cardiac  All the records are reviewed and case discussed with Care Management/Social Worker. Management plans discussed with the patient, nursing and they are in agreement.  Consultants:  None  Procedures:  Antimicrobials:    Data Reviewed: I have personally reviewed following labs and imaging studies  CBC: Recent Labs  Lab 06/20/20 1955 06/21/20 0500  WBC 12.4* 11.8*  NEUTROABS 10.6*  --   HGB 12.0 11.2*  HCT 39.6 38.3  MCV 78.0* 78.5*  PLT 282 825    Basic Metabolic Panel: Recent Labs  Lab 06/20/20 1955 06/21/20 0500 06/21/20 0847  NA 135 137  --   K 3.2* 3.9  --   CL 104 105  --   CO2 24 26  --   GLUCOSE 154* 134*  --   BUN 23 24*  --   CREATININE 1.04* 0.96  --   CALCIUM 8.7* 8.6*  --   MG  --   --  1.8    GFR: Estimated Creatinine Clearance: 50.1 mL/min (by C-G formula based on SCr of 0.96 mg/dL). Liver Function Tests: Recent Labs  Lab 06/20/20 1955  AST 19  ALT 17  ALKPHOS 66  BILITOT 1.3*  PROT 6.2*  ALBUMIN 3.5    Recent Labs  Lab 06/20/20 1955  LIPASE 87*    No results for input(s): AMMONIA in the last 168 hours. Coagulation Profile: No results for input(s): INR, PROTIME in the last 168 hours. Cardiac Enzymes: No results for input(s): CKTOTAL, CKMB, CKMBINDEX, TROPONINI in the last 168 hours. BNP (last 3 results) No results for input(s): PROBNP in the last 8760 hours. HbA1C: Recent Labs    06/21/20 0839  HGBA1C 5.9*   CBG: Recent Labs  Lab 06/22/20 2340 06/23/20 0520 06/23/20 0727 06/23/20 1159 06/23/20 1618  GLUCAP 108* 85 94 138* 117*    Lipid Profile: No results for input(s): CHOL, HDL, LDLCALC, TRIG, CHOLHDL, LDLDIRECT in the last 72 hours. Thyroid Function Tests: No results for input(s): TSH, T4TOTAL, FREET4, T3FREE, THYROIDAB in the last 72 hours. Anemia Panel: No results for input(s): VITAMINB12, FOLATE, FERRITIN, TIBC, IRON, RETICCTPCT in the last 72 hours. Sepsis Labs: No results for input(s): PROCALCITON, LATICACIDVEN in the last 168 hours.  Recent Results (from the past 240 hour(s))  Resp Panel by RT-PCR (Flu A&B, Covid) Nasopharyngeal Swab     Status: None   Collection Time: 06/20/20  7:55 PM   Specimen: Nasopharyngeal Swab;  Nasopharyngeal(NP) swabs in vial transport medium  Result Value Ref Range Status   SARS Coronavirus 2 by RT PCR NEGATIVE NEGATIVE Final    Comment: (NOTE) SARS-CoV-2 target nucleic acids are NOT DETECTED.  The SARS-CoV-2 RNA is generally detectable in upper respiratory specimens during the acute phase of infection. The lowest concentration of SARS-CoV-2 viral copies this assay can detect is 138 copies/mL. A negative result does not preclude SARS-Cov-2 infection and should not be used as the sole basis for treatment or other patient management decisions. A negative result may occur with  improper specimen collection/handling, submission of specimen other than nasopharyngeal swab, presence of viral mutation(s) within the areas targeted by this assay, and inadequate number of viral copies(<138 copies/mL). A negative result must be combined with clinical observations, patient history, and  epidemiological information. The expected result is Negative.  Fact Sheet for Patients:  EntrepreneurPulse.com.au  Fact Sheet for Healthcare Providers:  IncredibleEmployment.be  This test is no t yet approved or cleared by the [place] FDA and  has been authorized for detection and/or diagnosis of SARS-CoV-2 by FDA under an Emergency Use Authorization (EUA). This EUA will remain  in effect (meaning this test can be used) for the duration of the COVID-19 declaration under Section 564(b)(1) of the Act, 21 U.S.C.section 360bbb-3(b)(1), unless the authorization is terminated  or revoked sooner.       Influenza A by PCR NEGATIVE NEGATIVE Final   Influenza B by PCR NEGATIVE NEGATIVE Final    Comment: (NOTE) The Xpert Xpress SARS-CoV-2/FLU/RSV plus assay is intended as an aid in the diagnosis of influenza from Nasopharyngeal swab specimens and should not be used as a sole basis for treatment. Nasal washings and aspirates are unacceptable for Xpert Xpress  SARS-CoV-2/FLU/RSV testing.  Fact Sheet for Patients: EntrepreneurPulse.com.au  Fact Sheet for Healthcare Providers: IncredibleEmployment.be  This test is not yet approved or cleared by the [place] FDA and has been authorized for detection and/or diagnosis of SARS-CoV-2 by FDA under an Emergency Use Authorization (EUA). This EUA will remain in effect (meaning this test can be used) for the duration of the COVID-19 declaration under Section 564(b)(1) of the Act, 21 U.S.C. section 360bbb-3(b)(1), unless the authorization is terminated or revoked.  Performed at [facility], [address], [place], [place] [address]       Radiology Studies: No results found.  Scheduled Meds:  clopidogrel  75 mg Oral Daily   FLUoxetine  20 mg Oral Daily   gabapentin  300 mg Oral BID   insulin aspart  0-9 Units Subcutaneous Q4H   lisinopril  2.5 mg Oral Daily   metoprolol tartrate  50 mg Oral BID   midodrine  2.5 mg Oral TID WC   pantoprazole  40 mg Oral Daily   rivaroxaban  20 mg Oral Q supper   rosuvastatin  20 mg Oral Daily   Continuous Infusions:     LOS: 0 days   Time spent: 25 minutes. More than 50% of the time was spent in counseling/coordination of care  Lorella Nimrod, MD [facility]  If 7PM-7AM, please contact night-coverage [email or web address]  06/23/2020, 4:49 PM   This record has been created using Systems analyst. Errors have been sought and corrected,but may not always be located. Such creation errors do not reflect on the standard of care.

## 2020-06-23 NOTE — Progress Notes (Signed)
Physical Therapy Treatment Patient Details Name: Kayla Maxwell MRN: [id number] DOB: January 30, 1945 Today's Date: 06/23/2020    History of Present Illness Pt is a 75 y/o F admitted on 06/20/20 with c/c of N&V with diarrhea. Pt was noted by EMS to be in a-fib with RVR. Pt being treated for acute gastroenteritis & a-fib with RVR. PMH: a-fib, cervical CA, CHF, chronic back pain, HA, HLD, HTN, sleep apnea, stroke, TIAs    PT Comments    Pt is making good progress towards goals with ability to tolerate further mobility this date with decreased physical assistance. Able to stand at bedside and perform limited pre gait activities. Fatigues with changes in balance, heavy post leaning (inconsistently able to self correct), slight lateral R lean in sitting, appears improved from previous session. No complaints of nausea this date, generalized malaise. Pt continues to be motivated to perform and eager to get stronger at SNF. Continue to recommend SNF level of care.  Follow Up Recommendations  SNF     Equipment Recommendations   (TBD)    Recommendations for Other Services       Precautions / Restrictions Precautions Precautions: Fall Restrictions Weight Bearing Restrictions: No    Mobility  Bed Mobility Overal bed mobility: Needs Assistance Bed Mobility: Supine to Sit;Sit to Supine     Supine to sit: Supervision;HOB elevated Sit to supine: Supervision   General bed mobility comments: needs extra time to complete task. Safe technique, appears motivated to perform    Transfers Overall transfer level: Needs assistance Equipment used: Rolling walker (2 wheeled) Transfers: Sit to/from Stand Sit to Stand: Mod assist         General transfer comment: multiple attempts performed, each with ability to stand for 1 min each attempt. Heavy bracing with legs against bed and cues for breathing as she tends to hold breath. Improved to min assist for transfer. Once seated at bedside, able to perform  lateral transfer up towards [facility] with cga  Ambulation/Gait             General Gait Details: unsafe to perform   Stairs             Wheelchair Mobility    Modified Rankin (Stroke Patients Only)       Balance Overall balance assessment: Needs assistance Sitting-balance support: Feet supported;Bilateral upper extremity supported Sitting balance-Leahy Scale: Fair Sitting balance - Comments: R lateral lean/able to self correct with cues   Standing balance support: Bilateral upper extremity supported Standing balance-Leahy Scale: Poor Standing balance comment: B UE support with additional person to support RW; post leaning                            Cognition Arousal/Alertness: Awake/alert Behavior During Therapy: WFL for tasks assessed/performed Overall Cognitive Status: Within Functional Limits for tasks assessed                                        Exercises Other Exercises Other Exercises: pre gait activities performed including heel raises and weight shift x 3 reps. Mod assist for maintaining balance. Other Exercises: supine/seated ther-ex performed on B LE including alt marching, LAQ, SLRs, hip add squeezes, and heel slides. All ther-ex performed x 10 reps with cga.    General Comments        Pertinent Vitals/Pain Pain Assessment: No/denies pain  Home Living                      Prior Function            PT Goals (current goals can now be found in the care plan section) Acute Rehab PT Goals Patient Stated Goal: feel better PT Goal Formulation: With patient Time For Goal Achievement: 07/06/20 Potential to Achieve Goals: Good Progress towards PT goals: Progressing toward goals    Frequency    Min 2X/week      PT Plan Current plan remains appropriate    Co-evaluation              AM-PAC PT "6 Clicks" Mobility   Outcome Measure  Help needed turning from your back to your side while in a  flat bed without using bedrails?: A Little Help needed moving from lying on your back to sitting on the side of a flat bed without using bedrails?: A Little Help needed moving to and from a bed to a chair (including a wheelchair)?: A Lot Help needed standing up from a chair using your arms (e.g., wheelchair or bedside chair)?: A Lot Help needed to walk in hospital room?: Total Help needed climbing 3-5 steps with a railing? : Total 6 Click Score: 12    End of Session Equipment Utilized During Treatment: Gait belt Activity Tolerance: Patient limited by fatigue Patient left: in bed;with call bell/phone within reach;with bed alarm set Nurse Communication: Mobility status PT Visit Diagnosis: Difficulty in walking, not elsewhere classified (R26.2);Muscle weakness (generalized) (M62.81);Unsteadiness on feet (R26.81)     Time: 1536-1600 PT Time Calculation (min) (ACUTE ONLY): 24 min  Charges:  $Therapeutic Exercise: 23-37 mins                     Greggory Stallion, [place], DPT [phone]    Ramiah Helfrich 06/23/2020, 4:38 PM

## 2020-06-24 DIAGNOSIS — Z743 Need for continuous supervision: Secondary | ICD-10-CM | POA: Diagnosis not present

## 2020-06-24 DIAGNOSIS — G9009 Other idiopathic peripheral autonomic neuropathy: Secondary | ICD-10-CM | POA: Diagnosis not present

## 2020-06-24 DIAGNOSIS — Z741 Need for assistance with personal care: Secondary | ICD-10-CM | POA: Diagnosis not present

## 2020-06-24 DIAGNOSIS — R278 Other lack of coordination: Secondary | ICD-10-CM | POA: Diagnosis not present

## 2020-06-24 DIAGNOSIS — R2681 Unsteadiness on feet: Secondary | ICD-10-CM | POA: Diagnosis not present

## 2020-06-24 DIAGNOSIS — S52511A Displaced fracture of right radial styloid process, initial encounter for closed fracture: Secondary | ICD-10-CM | POA: Diagnosis not present

## 2020-06-24 DIAGNOSIS — M549 Dorsalgia, unspecified: Secondary | ICD-10-CM | POA: Diagnosis not present

## 2020-06-24 DIAGNOSIS — S52571A Other intraarticular fracture of lower end of right radius, initial encounter for closed fracture: Secondary | ICD-10-CM | POA: Diagnosis not present

## 2020-06-24 DIAGNOSIS — Z7902 Long term (current) use of antithrombotics/antiplatelets: Secondary | ICD-10-CM | POA: Diagnosis not present

## 2020-06-24 DIAGNOSIS — Y92129 Unspecified place in nursing home as the place of occurrence of the external cause: Secondary | ICD-10-CM | POA: Diagnosis not present

## 2020-06-24 DIAGNOSIS — I5022 Chronic systolic (congestive) heart failure: Secondary | ICD-10-CM | POA: Diagnosis present

## 2020-06-24 DIAGNOSIS — I509 Heart failure, unspecified: Secondary | ICD-10-CM

## 2020-06-24 DIAGNOSIS — F339 Major depressive disorder, recurrent, unspecified: Secondary | ICD-10-CM | POA: Diagnosis not present

## 2020-06-24 DIAGNOSIS — F32A Depression, unspecified: Secondary | ICD-10-CM | POA: Diagnosis present

## 2020-06-24 DIAGNOSIS — Z8541 Personal history of malignant neoplasm of cervix uteri: Secondary | ICD-10-CM | POA: Diagnosis not present

## 2020-06-24 DIAGNOSIS — K219 Gastro-esophageal reflux disease without esophagitis: Secondary | ICD-10-CM | POA: Diagnosis present

## 2020-06-24 DIAGNOSIS — D649 Anemia, unspecified: Secondary | ICD-10-CM | POA: Diagnosis not present

## 2020-06-24 DIAGNOSIS — R498 Other voice and resonance disorders: Secondary | ICD-10-CM | POA: Diagnosis not present

## 2020-06-24 DIAGNOSIS — K2100 Gastro-esophageal reflux disease with esophagitis, without bleeding: Secondary | ICD-10-CM | POA: Diagnosis not present

## 2020-06-24 DIAGNOSIS — I70229 Atherosclerosis of native arteries of extremities with rest pain, unspecified extremity: Secondary | ICD-10-CM | POA: Diagnosis not present

## 2020-06-24 DIAGNOSIS — S52501A Unspecified fracture of the lower end of right radius, initial encounter for closed fracture: Secondary | ICD-10-CM | POA: Diagnosis not present

## 2020-06-24 DIAGNOSIS — Z8781 Personal history of (healed) traumatic fracture: Secondary | ICD-10-CM | POA: Diagnosis not present

## 2020-06-24 DIAGNOSIS — Z7984 Long term (current) use of oral hypoglycemic drugs: Secondary | ICD-10-CM | POA: Diagnosis not present

## 2020-06-24 DIAGNOSIS — I739 Peripheral vascular disease, unspecified: Secondary | ICD-10-CM | POA: Diagnosis not present

## 2020-06-24 DIAGNOSIS — E782 Mixed hyperlipidemia: Secondary | ICD-10-CM | POA: Diagnosis present

## 2020-06-24 DIAGNOSIS — Z8673 Personal history of transient ischemic attack (TIA), and cerebral infarction without residual deficits: Secondary | ICD-10-CM | POA: Diagnosis not present

## 2020-06-24 DIAGNOSIS — I10 Essential (primary) hypertension: Secondary | ICD-10-CM | POA: Diagnosis not present

## 2020-06-24 DIAGNOSIS — I639 Cerebral infarction, unspecified: Secondary | ICD-10-CM | POA: Diagnosis not present

## 2020-06-24 DIAGNOSIS — W01198A Fall on same level from slipping, tripping and stumbling with subsequent striking against other object, initial encounter: Secondary | ICD-10-CM | POA: Diagnosis not present

## 2020-06-24 DIAGNOSIS — E119 Type 2 diabetes mellitus without complications: Secondary | ICD-10-CM | POA: Diagnosis not present

## 2020-06-24 DIAGNOSIS — S0990XA Unspecified injury of head, initial encounter: Secondary | ICD-10-CM | POA: Diagnosis not present

## 2020-06-24 DIAGNOSIS — Z888 Allergy status to other drugs, medicaments and biological substances status: Secondary | ICD-10-CM | POA: Diagnosis not present

## 2020-06-24 DIAGNOSIS — E785 Hyperlipidemia, unspecified: Secondary | ICD-10-CM | POA: Diagnosis not present

## 2020-06-24 DIAGNOSIS — Z885 Allergy status to narcotic agent status: Secondary | ICD-10-CM | POA: Diagnosis not present

## 2020-06-24 DIAGNOSIS — Z7982 Long term (current) use of aspirin: Secondary | ICD-10-CM | POA: Diagnosis not present

## 2020-06-24 DIAGNOSIS — Z7901 Long term (current) use of anticoagulants: Secondary | ICD-10-CM | POA: Diagnosis not present

## 2020-06-24 DIAGNOSIS — Z955 Presence of coronary angioplasty implant and graft: Secondary | ICD-10-CM | POA: Diagnosis not present

## 2020-06-24 DIAGNOSIS — Z87891 Personal history of nicotine dependence: Secondary | ICD-10-CM | POA: Diagnosis not present

## 2020-06-24 DIAGNOSIS — R001 Bradycardia, unspecified: Secondary | ICD-10-CM | POA: Diagnosis not present

## 2020-06-24 DIAGNOSIS — Z9049 Acquired absence of other specified parts of digestive tract: Secondary | ICD-10-CM | POA: Diagnosis not present

## 2020-06-24 DIAGNOSIS — K529 Noninfective gastroenteritis and colitis, unspecified: Secondary | ICD-10-CM | POA: Diagnosis not present

## 2020-06-24 DIAGNOSIS — Z9889 Other specified postprocedural states: Secondary | ICD-10-CM | POA: Diagnosis not present

## 2020-06-24 DIAGNOSIS — I959 Hypotension, unspecified: Secondary | ICD-10-CM | POA: Diagnosis not present

## 2020-06-24 DIAGNOSIS — G8929 Other chronic pain: Secondary | ICD-10-CM | POA: Diagnosis not present

## 2020-06-24 DIAGNOSIS — Z91040 Latex allergy status: Secondary | ICD-10-CM | POA: Diagnosis not present

## 2020-06-24 DIAGNOSIS — M6281 Muscle weakness (generalized): Secondary | ICD-10-CM | POA: Diagnosis not present

## 2020-06-24 DIAGNOSIS — Z882 Allergy status to sulfonamides status: Secondary | ICD-10-CM | POA: Diagnosis not present

## 2020-06-24 DIAGNOSIS — Z66 Do not resuscitate: Secondary | ICD-10-CM | POA: Diagnosis present

## 2020-06-24 DIAGNOSIS — I110 Hypertensive heart disease with heart failure: Secondary | ICD-10-CM | POA: Diagnosis not present

## 2020-06-24 DIAGNOSIS — E1142 Type 2 diabetes mellitus with diabetic polyneuropathy: Secondary | ICD-10-CM | POA: Diagnosis not present

## 2020-06-24 DIAGNOSIS — R2689 Other abnormalities of gait and mobility: Secondary | ICD-10-CM | POA: Diagnosis not present

## 2020-06-24 DIAGNOSIS — R1084 Generalized abdominal pain: Secondary | ICD-10-CM | POA: Diagnosis not present

## 2020-06-24 DIAGNOSIS — A084 Viral intestinal infection, unspecified: Secondary | ICD-10-CM | POA: Diagnosis present

## 2020-06-24 DIAGNOSIS — W19XXXA Unspecified fall, initial encounter: Secondary | ICD-10-CM | POA: Diagnosis not present

## 2020-06-24 DIAGNOSIS — M25531 Pain in right wrist: Secondary | ICD-10-CM | POA: Diagnosis not present

## 2020-06-24 DIAGNOSIS — S6991XA Unspecified injury of right wrist, hand and finger(s), initial encounter: Secondary | ICD-10-CM | POA: Diagnosis present

## 2020-06-24 DIAGNOSIS — I4891 Unspecified atrial fibrillation: Secondary | ICD-10-CM | POA: Diagnosis not present

## 2020-06-24 DIAGNOSIS — Z79899 Other long term (current) drug therapy: Secondary | ICD-10-CM | POA: Diagnosis not present

## 2020-06-24 DIAGNOSIS — Z880 Allergy status to penicillin: Secondary | ICD-10-CM | POA: Diagnosis not present

## 2020-06-24 DIAGNOSIS — Z20822 Contact with and (suspected) exposure to covid-19: Secondary | ICD-10-CM | POA: Diagnosis present

## 2020-06-24 DIAGNOSIS — E860 Dehydration: Secondary | ICD-10-CM | POA: Diagnosis not present

## 2020-06-24 DIAGNOSIS — I252 Old myocardial infarction: Secondary | ICD-10-CM | POA: Diagnosis not present

## 2020-06-24 DIAGNOSIS — S52531A Colles' fracture of right radius, initial encounter for closed fracture: Secondary | ICD-10-CM | POA: Diagnosis not present

## 2020-06-24 DIAGNOSIS — X58XXXA Exposure to other specified factors, initial encounter: Secondary | ICD-10-CM | POA: Diagnosis not present

## 2020-06-24 DIAGNOSIS — R0902 Hypoxemia: Secondary | ICD-10-CM | POA: Diagnosis not present

## 2020-06-24 DIAGNOSIS — R519 Headache, unspecified: Secondary | ICD-10-CM | POA: Diagnosis not present

## 2020-06-24 DIAGNOSIS — R279 Unspecified lack of coordination: Secondary | ICD-10-CM | POA: Diagnosis not present

## 2020-06-24 DIAGNOSIS — I2510 Atherosclerotic heart disease of native coronary artery without angina pectoris: Secondary | ICD-10-CM | POA: Diagnosis present

## 2020-06-24 DIAGNOSIS — J111 Influenza due to unidentified influenza virus with other respiratory manifestations: Secondary | ICD-10-CM | POA: Diagnosis not present

## 2020-06-24 DIAGNOSIS — I4821 Permanent atrial fibrillation: Secondary | ICD-10-CM | POA: Diagnosis present

## 2020-06-24 DIAGNOSIS — Z8249 Family history of ischemic heart disease and other diseases of the circulatory system: Secondary | ICD-10-CM | POA: Diagnosis not present

## 2020-06-24 DIAGNOSIS — R112 Nausea with vomiting, unspecified: Secondary | ICD-10-CM | POA: Diagnosis not present

## 2020-06-24 DIAGNOSIS — G4730 Sleep apnea, unspecified: Secondary | ICD-10-CM | POA: Diagnosis not present

## 2020-06-24 LAB — CBC
HCT: 37.4 % (ref 36.0–46.0)
Hemoglobin: 10.9 g/dL — ABNORMAL LOW (ref 12.0–15.0)
MCH: 23.3 pg — ABNORMAL LOW (ref 26.0–34.0)
MCHC: 29.1 g/dL — ABNORMAL LOW (ref 30.0–36.0)
MCV: 80.1 fL (ref 80.0–100.0)
Platelets: 272 10*3/uL (ref 150–400)
RBC: 4.67 MIL/uL (ref 3.87–5.11)
RDW: 16.9 % — ABNORMAL HIGH (ref 11.5–15.5)
WBC: 6.5 10*3/uL (ref 4.0–10.5)
nRBC: 0 % (ref 0.0–0.2)

## 2020-06-24 LAB — SARS CORONAVIRUS 2 (TAT 6-24 HRS): SARS Coronavirus 2: NEGATIVE

## 2020-06-24 LAB — GLUCOSE, CAPILLARY
Glucose-Capillary: 116 mg/dL — ABNORMAL HIGH (ref 70–99)
Glucose-Capillary: 97 mg/dL (ref 70–99)
Glucose-Capillary: 102 mg/dL — ABNORMAL HIGH (ref 70–99)

## 2020-06-24 MED ORDER — GABAPENTIN 300 MG PO CAPS: 300.0000 mg | ORAL_CAPSULE | Freq: Two times a day (BID) | ORAL | Status: DC

## 2020-06-24 NOTE — TOC Transition Note (Signed)
Transition of Care [facility]) - CM/SW Discharge Note   Patient Details  Name: Kayla Maxwell MRN: [id number] Date of Birth: 29-Sep-1945  Transition of [facility]) CM/SW Contact:  Alberteen Sam, LCSW Phone Number: 06/24/2020, 10:12 AM   Clinical Narrative:     Patient will DC to: [facility] Anticipated DC date: 06/24/20 Family notified:[name] (daughter) Transport [id number]  Per MD patient ready for DC to [facility].  RN, patient, patient's family, and facility notified of DC. Discharge Summary sent to facility. RN given number for report  [phone] Room 714. DC packet on chart. Ambulance transport requested for patient.  CSW signing off.  Pricilla Riffle, LCSW    Final next level of care: Skilled Nursing Facility Barriers to Discharge: No Barriers Identified   Patient Goals and CMS Choice Patient states their goals for this hospitalization and ongoing recovery are:: to go to SNF CMS Medicare.gov Compare Post Acute Care list provided to:: Patient Represenative (must comment) (daughter [name]) Choice offered to / list presented to : Adult Children  Discharge Placement                Patient to be transferred to facility by: [facility] Name of family member notified: [name] Patient and family notified of of transfer: 06/24/20  Discharge Plan and Services     Post Acute Care Choice: [facility]                               Social Determinants of Health (SDOH) Interventions     Readmission Risk Interventions No flowsheet data found.

## 2020-06-24 NOTE — Discharge Summary (Signed)
Physician Discharge Summary  Kayla Maxwell WYO:[id number] DOB: [birth year] DOA: 06/20/2020  PCP: Mar Daring, PA-C  Admit date: 06/20/2020 Discharge date: 06/24/2020  Admitted From: Home Disposition: SNF  Recommendations for Outpatient Follow-up:  Follow up with PCP in 1-2 weeks Please obtain BMP/CBC in one week Please follow up on the following pending results: None  Home Health: No Equipment/Devices: Rolling walker Discharge Condition: Stable CODE STATUS: DNR Diet recommendation: Heart Healthy / Carb Modified   Brief/Interim Summary: Kayla Maxwell is a 75 y.o. Caucasian female with medical history significant for chronic medical problems which include peripheral neuropathy, diabetes mellitus, coronary artery disease, A. fib on Xarelto, essential hypertension and depression presented with nausea and vomiting with associated diarrhea without melena or bright red bleeding per rectum.  She admitted denies any fever or chills.  No dysuria, hematuria. CT abdomen without any significant abnormality, did show diverticulosis without diverticulitis.  Mild leukocytosis.   Admitted for gastroenteritis most likely viral. Started on IV hydration along with supportive care.   Nausea vomiting resolved.  Able to tolerate diet.  She was feeling overall very weak and was requesting PT evaluation stating that she lives alone, PT is recommending SNF.  Patient is being discharged to SNF for rehab before returning home.  We increased her home dose of Neurontin from once daily to twice daily due to foot pain.  Uric acid level was checked and it they were low.  Patient has an history of A. fib and presented with RVR which has been resolved.  She will continue her home dose of Xarelto and metoprolol.  Patient has an history of coronary artery disease and apparently she was taking aspirin, Plavix and Xarelto.  We discontinued the aspirin and she is currently on Plavix and Xarelto.  We advised patient  to discussed with her cardiologist if she needs to be on a Plavix or not as it will increase her chances of bleeding.  Has an history of depression, no acute concern.  She will continue home dose of Prozac.  He will continue rest of her home medications and follow-up with her providers.  Discharge Diagnoses:  Active Problems:   Atrial fibrillation (HCC)   Intractable nausea and vomiting   Type 2 diabetes mellitus without complication, without long-term current use of insulin (HCC)   Generalized abdominal pain   Influenza-like illness   Chronic congestive heart failure [facility])    Discharge Instructions  Discharge Instructions     Amb referral to [facility]   Complete by: As directed    Diet - low sodium heart healthy   Complete by: As directed    Discharge instructions   Complete by: As directed    It was pleasure taking care of you. Please follow-up very closely with your cardiologist as you should not be taking aspirin, Plavix and Xarelto altogether for a long time as it will markedly increase your chances of bleeding. Your provider has already stopped Brilinta so do not take it. We stopped the aspirin-continue taking Plavix and Xarelto and follow-up with your cardiologist. Keep yourself well-hydrated.   Increase activity slowly   Complete by: As directed       Allergies as of 06/24/2020       Reactions   Codeine Other (See Comments)   Makes her feel "crazy"   Penicillins Swelling   Did it involve swelling of the face/tongue/throat, SOB, or low BP? Unknown Did it involve sudden or severe rash/hives, skin peeling, or any reaction on  the inside of your mouth or nose? No Did you need to seek medical attention at a hospital or doctor's office? Unknown When did it last happen?      Childhood allergy If all above answers are "NO", may proceed with cephalosporin use.   Latex    Adhesive [tape] Hives   (Latex)   Sulfa Antibiotics Nausea Only, Rash        Medication List      STOP taking these medications    aspirin 81 MG EC tablet   dapagliflozin propanediol 5 MG Tabs tablet Commonly known as: FARXIGA   midodrine 2.5 MG tablet Commonly known as: PROAMATINE   ticagrelor 90 MG Tabs tablet Commonly known as: BRILINTA       TAKE these medications    albuterol 108 (90 Base) MCG/ACT inhaler Commonly known as: VENTOLIN HFA Inhale 2 puffs into the lungs every 6 (six) hours as needed for wheezing or shortness of breath.   clopidogrel 75 MG tablet Commonly known as: PLAVIX Take 75 mg by mouth daily.   famotidine 20 MG tablet Commonly known as: PEPCID Take 1 tablet (20 mg total) by mouth 2 (two) times daily.   furosemide 20 MG tablet Commonly known as: LASIX Take 1 tablet (20 mg total) by mouth daily as needed for fluid or edema.   gabapentin 300 MG capsule Commonly known as: NEURONTIN Take 1 capsule (300 mg total) by mouth 2 (two) times daily. What changed: See the new instructions.   HYDROcodone-acetaminophen 5-325 MG tablet Commonly known as: Norco Take 1-2 tablets by mouth every 8 (eight) hours as needed for moderate pain or severe pain.   lisinopril 2.5 MG tablet Commonly known as: ZESTRIL TAKE 1 TABLET BY MOUTH EVERY DAY   nitroGLYCERIN 0.4 MG SL tablet Commonly known as: NITROSTAT Place 1 tablet (0.4 mg total) under the tongue every 5 (five) minutes x 3 doses as needed for chest pain.   ondansetron 4 MG disintegrating tablet Commonly known as: Zofran ODT Take 1 tablet (4 mg total) by mouth every 8 (eight) hours as needed for nausea or vomiting. What changed: reasons to take this   rivaroxaban 20 MG Tabs tablet Commonly known as: XARELTO Take 20 mg by mouth daily with supper.   rosuvastatin 20 MG tablet Commonly known as: CRESTOR Take 1 tablet (20 mg total) by mouth daily.       ASK your doctor about these medications    FLUoxetine 20 MG capsule Commonly known as: PROZAC TAKE 1 CAPSULE BY MOUTH EVERY DAY    metoprolol tartrate 25 MG tablet Commonly known as: LOPRESSOR TAKE 2 TABLETS BY MOUTH TWICE A DAY   omeprazole 40 MG capsule Commonly known as: PRILOSEC TAKE 1 CAPSULE BY MOUTH EVERY DAY        Follow-up Information     Mar Daring, PA-C In 3 days.   Specialty: Family Medicine Contact information: [place] [address] [phone]                Allergies  Allergen Reactions   Codeine Other (See Comments)    Makes her feel "crazy"   Penicillins Swelling    Did it involve swelling of the face/tongue/throat, SOB, or low BP? Unknown Did it involve sudden or severe rash/hives, skin peeling, or any reaction on the inside of your mouth or nose? No Did you need to seek medical attention at a hospital or doctor's office? Unknown When did it last happen?  Childhood allergy If all above answers are "NO", may proceed with cephalosporin use.    Latex    Adhesive [Tape] Hives    (Latex)   Sulfa Antibiotics Nausea Only and Rash    Consultations: None  Procedures/Studies: CT ABDOMEN PELVIS WO CONTRAST  Result Date: 06/20/2020 CLINICAL DATA:  Nausea and vomiting EXAM: CT ABDOMEN AND PELVIS WITHOUT CONTRAST TECHNIQUE: Multidetector CT imaging of the abdomen and pelvis was performed following the standard protocol without IV contrast. COMPARISON:  06/29/2017 FINDINGS: Lower chest: No acute abnormality.Cardiomegaly is noted. Hepatobiliary: No focal liver abnormality is seen. Status post cholecystectomy. No biliary dilatation. Pancreas: Unremarkable. No pancreatic ductal dilatation or surrounding inflammatory changes. Spleen: Normal in size without focal abnormality. Adrenals/Urinary Tract: Adrenal glands are within normal limits. The kidneys are well visualized. A punctate calculus is noted in the midportion of the right kidney. No obstructive changes are seen. Bilateral renal cysts are noted. These are stable from the prior exam. Bladder is  partially distended. Stomach/Bowel: Scattered diverticular change of the colon is noted without evidence of diverticulitis. No obstructive or inflammatory changes are seen. The appendix has been surgically removed. Small bowel and stomach appear unremarkable. Vascular/Lymphatic: Vascular calcifications are noted. Bilateral iliac stenting is seen. No aneurysmal dilatation is noted. No significant adenopathy is noted. Reproductive: Status post hysterectomy. No adnexal masses. Other: No abdominal wall hernia or abnormality. No abdominopelvic ascites. Musculoskeletal: Degenerative changes of the lumbar spine are seen. No acute bony abnormality is noted. IMPRESSION: Diverticulosis without diverticulitis. Chronic changes similar to that seen on prior exam. No acute abnormality to correspond with the given clinical history is seen. Electronically Signed   By: Inez Catalina M.D.   On: 06/20/2020 22:06   CT HEAD WO CONTRAST  Result Date: 06/23/2020 CLINICAL DATA:  Mental status change EXAM: CT HEAD WITHOUT CONTRAST TECHNIQUE: Contiguous axial images were obtained from the base of the skull through the vertex without intravenous contrast. COMPARISON:  None. FINDINGS: Brain: Ill-defined low-attenuation in the inferior left cerebellar hemisphere. There is no acute intracranial hemorrhage or mass effect. Chronic infarct of the left parietal lobe is again seen. There is no extra-axial fluid collection. Ventricles and sulci are within normal limits in size and configuration. Vascular: There is atherosclerotic calcification at the skull base. Skull: Calvarium is unremarkable. Sinuses/Orbits: No acute finding. Other: None. IMPRESSION: Ill-defined low-density in the inferior left cerebellum may reflect age-indeterminate infarction or edema. MRI is recommended for further evaluation. No acute intracranial hemorrhage.  Chronic left parietal infarct. Electronically Signed   By: Macy Mis M.D.   On: 06/23/2020 17:12   DG Chest  Portable 1 View  Result Date: 06/20/2020 CLINICAL DATA:  Short of breath, fatigue EXAM: PORTABLE CHEST 1 VIEW COMPARISON:  02/12/2020 FINDINGS: 2 frontal views of the chest demonstrate an unremarkable cardiac silhouette. Stable atherosclerosis of the aortic arch. No airspace disease, effusion, or pneumothorax. No acute bony abnormality. IMPRESSION: 1. No acute intrathoracic process. Electronically Signed   By: Randa Ngo M.D.   On: 06/20/2020 20:17    Subjective: Patient was seen and examined today.  No new complaint.  Able to eat well, hoping to go home soon from rehab.  Discharge Exam: Vitals:   06/23/20 2027 06/24/20 0729  BP: 130/75 (!) 134/92  Pulse: 79 78  Resp: 20 18  Temp: 98.9 F (37.2 C) 98.7 F (37.1 C)  SpO2: 94% 92%   Vitals:   06/23/20 1155 06/23/20 1617 06/23/20 2027 06/24/20 0729  BP: (!) 106/51 (!) 110/47  130/75 (!) 134/92  Pulse: 82 64 79 78  Resp: 18 18 20 18   Temp: 99 F (37.2 C) 98.5 F (36.9 C) 98.9 F (37.2 C) 98.7 F (37.1 C)  TempSrc: Oral   Oral  SpO2: 94% 93% 94% 92%  Weight:      Height:        General: Pt is alert, awake, not in acute distress Cardiovascular: RRR, S1/S2 +, no rubs, no gallops Respiratory: CTA bilaterally, no wheezing, no rhonchi Abdominal: Soft, NT, ND, bowel sounds + Extremities: no edema, no cyanosis   The results of significant diagnostics from this hospitalization (including imaging, microbiology, ancillary and laboratory) are listed below for reference.    Microbiology: Recent Results (from the past 240 hour(s))  Resp Panel by RT-PCR (Flu A&B, Covid) Nasopharyngeal Swab     Status: None   Collection Time: 06/20/20  7:55 PM   Specimen: Nasopharyngeal Swab; Nasopharyngeal(NP) swabs in vial transport medium  Result Value Ref Range Status   SARS Coronavirus 2 by RT PCR NEGATIVE NEGATIVE Final    Comment: (NOTE) SARS-CoV-2 target nucleic acids are NOT DETECTED.  The SARS-CoV-2 RNA is generally detectable in upper  respiratory specimens during the acute phase of infection. The lowest concentration of SARS-CoV-2 viral copies this assay can detect is 138 copies/mL. A negative result does not preclude SARS-Cov-2 infection and should not be used as the sole basis for treatment or other patient management decisions. A negative result may occur with  improper specimen collection/handling, submission of specimen other than nasopharyngeal swab, presence of viral mutation(s) within the areas targeted by this assay, and inadequate number of viral copies(<138 copies/mL). A negative result must be combined with clinical observations, patient history, and epidemiological information. The expected result is Negative.  Fact Sheet for Patients:  EntrepreneurPulse.com.au  Fact Sheet for Healthcare Providers:  IncredibleEmployment.be  This test is no t yet approved or cleared by the [place] FDA and  has been authorized for detection and/or diagnosis of SARS-CoV-2 by FDA under an Emergency Use Authorization (EUA). This EUA will remain  in effect (meaning this test can be used) for the duration of the COVID-19 declaration under Section 564(b)(1) of the Act, 21 U.S.C.section 360bbb-3(b)(1), unless the authorization is terminated  or revoked sooner.       Influenza A by PCR NEGATIVE NEGATIVE Final   Influenza B by PCR NEGATIVE NEGATIVE Final    Comment: (NOTE) The Xpert Xpress SARS-CoV-2/FLU/RSV plus assay is intended as an aid in the diagnosis of influenza from Nasopharyngeal swab specimens and should not be used as a sole basis for treatment. Nasal washings and aspirates are unacceptable for Xpert Xpress SARS-CoV-2/FLU/RSV testing.  Fact Sheet for Patients: EntrepreneurPulse.com.au  Fact Sheet for Healthcare Providers: IncredibleEmployment.be  This test is not yet approved or cleared by the [place] FDA and has been  authorized for detection and/or diagnosis of SARS-CoV-2 by FDA under an Emergency Use Authorization (EUA). This EUA will remain in effect (meaning this test can be used) for the duration of the COVID-19 declaration under Section 564(b)(1) of the Act, 21 U.S.C. section 360bbb-3(b)(1), unless the authorization is terminated or revoked.  Performed at [facility], [place], [place] [address]   SARS CORONAVIRUS 2 (TAT 6-24 HRS) Nasopharyngeal Nasopharyngeal Swab     Status: None   Collection Time: 06/23/20  4:53 PM   Specimen: Nasopharyngeal Swab  Result Value Ref Range Status   SARS Coronavirus 2 NEGATIVE NEGATIVE Final  Comment: (NOTE) SARS-CoV-2 target nucleic acids are NOT DETECTED.  The SARS-CoV-2 RNA is generally detectable in upper and lower respiratory specimens during the acute phase of infection. Negative results do not preclude SARS-CoV-2 infection, do not rule out co-infections with other pathogens, and should not be used as the sole basis for treatment or other patient management decisions. Negative results must be combined with clinical observations, patient history, and epidemiological information. The expected result is Negative.  Fact Sheet for Patients: SugarRoll.be  Fact Sheet for Healthcare Providers: https://www.woods-mathews.com/  This test is not yet approved or cleared by the [place] FDA and  has been authorized for detection and/or diagnosis of SARS-CoV-2 by FDA under an Emergency Use Authorization (EUA). This EUA will remain  in effect (meaning this test can be used) for the duration of the COVID-19 declaration under Se ction 564(b)(1) of the Act, 21 U.S.C. section 360bbb-3(b)(1), unless the authorization is terminated or revoked sooner.  Performed at [facility], [place] [address], [place], [place] [address]      Labs: BNP (last 3 results) Recent Labs    02/12/20 1120   BNP 174.9*   Basic Metabolic Panel: Recent Labs  Lab 06/20/20 1955 06/21/20 0500 06/21/20 0847  NA 135 137  --   K 3.2* 3.9  --   CL 104 105  --   CO2 24 26  --   GLUCOSE 154* 134*  --   BUN 23 24*  --   CREATININE 1.04* 0.96  --   CALCIUM 8.7* 8.6*  --   MG  --   --  1.8   Liver Function Tests: Recent Labs  Lab 06/20/20 1955  AST 19  ALT 17  ALKPHOS 66  BILITOT 1.3*  PROT 6.2*  ALBUMIN 3.5   Recent Labs  Lab 06/20/20 1955  LIPASE 87*   No results for input(s): AMMONIA in the last 168 hours. CBC: Recent Labs  Lab 06/20/20 1955 06/21/20 0500 06/24/20 0530  WBC 12.4* 11.8* 6.5  NEUTROABS 10.6*  --   --   HGB 12.0 11.2* 10.9*  HCT 39.6 38.3 37.4  MCV 78.0* 78.5* 80.1  PLT 282 262 272   Cardiac Enzymes: No results for input(s): CKTOTAL, CKMB, CKMBINDEX, TROPONINI in the last 168 hours. BNP: Invalid input(s): POCBNP CBG: Recent Labs  Lab 06/23/20 1618 06/23/20 2029 06/24/20 0105 06/24/20 0436 06/24/20 0730  GLUCAP 117* 117* 116* 97 102*   D-Dimer No results for input(s): DDIMER in the last 72 hours. Hgb A1c No results for input(s): HGBA1C in the last 72 hours. Lipid Profile No results for input(s): CHOL, HDL, LDLCALC, TRIG, CHOLHDL, LDLDIRECT in the last 72 hours. Thyroid function studies No results for input(s): TSH, T4TOTAL, T3FREE, THYROIDAB in the last 72 hours.  Invalid input(s): FREET3 Anemia work up No results for input(s): VITAMINB12, FOLATE, FERRITIN, TIBC, IRON, RETICCTPCT in the last 72 hours. Urinalysis    Component Value Date/Time   COLORURINE YELLOW (A) 06/21/2020 1315   APPEARANCEUR CLOUDY (A) 06/21/2020 1315   APPEARANCEUR Turbid (A) 10/29/2014 0000   LABSPEC 1.031 (H) 06/21/2020 1315   LABSPEC 1.012 11/09/2013 1155   PHURINE 5.0 06/21/2020 1315   GLUCOSEU >=500 (A) 06/21/2020 1315   GLUCOSEU Negative 11/09/2013 1155   HGBUR MODERATE (A) 06/21/2020 1315   BILIRUBINUR NEGATIVE 06/21/2020 1315   BILIRUBINUR neg  10/29/2014 1103   BILIRUBINUR Negative 10/29/2014 0000   BILIRUBINUR Negative 11/09/2013 1155   KETONESUR 20 (A) 06/21/2020 1315   PROTEINUR 100 (  A) 06/21/2020 1315   UROBILINOGEN 0.2 10/29/2014 1103   NITRITE NEGATIVE 06/21/2020 1315   LEUKOCYTESUR TRACE (A) 06/21/2020 1315   LEUKOCYTESUR Negative 11/09/2013 1155   Sepsis Labs Invalid input(s): PROCALCITONIN,  WBC,  LACTICIDVEN Microbiology Recent Results (from the past 240 hour(s))  Resp Panel by RT-PCR (Flu A&B, Covid) Nasopharyngeal Swab     Status: None   Collection Time: 06/20/20  7:55 PM   Specimen: Nasopharyngeal Swab; Nasopharyngeal(NP) swabs in vial transport medium  Result Value Ref Range Status   SARS Coronavirus 2 by RT PCR NEGATIVE NEGATIVE Final    Comment: (NOTE) SARS-CoV-2 target nucleic acids are NOT DETECTED.  The SARS-CoV-2 RNA is generally detectable in upper respiratory specimens during the acute phase of infection. The lowest concentration of SARS-CoV-2 viral copies this assay can detect is 138 copies/mL. A negative result does not preclude SARS-Cov-2 infection and should not be used as the sole basis for treatment or other patient management decisions. A negative result may occur with  improper specimen collection/handling, submission of specimen other than nasopharyngeal swab, presence of viral mutation(s) within the areas targeted by this assay, and inadequate number of viral copies(<138 copies/mL). A negative result must be combined with clinical observations, patient history, and epidemiological information. The expected result is Negative.  Fact Sheet for Patients:  EntrepreneurPulse.com.au  Fact Sheet for Healthcare Providers:  IncredibleEmployment.be  This test is no t yet approved or cleared by the [place] FDA and  has been authorized for detection and/or diagnosis of SARS-CoV-2 by FDA under an Emergency Use Authorization (EUA). This EUA will remain   in effect (meaning this test can be used) for the duration of the COVID-19 declaration under Section 564(b)(1) of the Act, 21 U.S.C.section 360bbb-3(b)(1), unless the authorization is terminated  or revoked sooner.       Influenza A by PCR NEGATIVE NEGATIVE Final   Influenza B by PCR NEGATIVE NEGATIVE Final    Comment: (NOTE) The Xpert Xpress SARS-CoV-2/FLU/RSV plus assay is intended as an aid in the diagnosis of influenza from Nasopharyngeal swab specimens and should not be used as a sole basis for treatment. Nasal washings and aspirates are unacceptable for Xpert Xpress SARS-CoV-2/FLU/RSV testing.  Fact Sheet for Patients: EntrepreneurPulse.com.au  Fact Sheet for Healthcare Providers: IncredibleEmployment.be  This test is not yet approved or cleared by the [place] FDA and has been authorized for detection and/or diagnosis of SARS-CoV-2 by FDA under an Emergency Use Authorization (EUA). This EUA will remain in effect (meaning this test can be used) for the duration of the COVID-19 declaration under Section 564(b)(1) of the Act, 21 U.S.C. section 360bbb-3(b)(1), unless the authorization is terminated or revoked.  Performed at [facility], [place], [place] [address]   SARS CORONAVIRUS 2 (TAT 6-24 HRS) Nasopharyngeal Nasopharyngeal Swab     Status: None   Collection Time: 06/23/20  4:53 PM   Specimen: Nasopharyngeal Swab  Result Value Ref Range Status   SARS Coronavirus 2 NEGATIVE NEGATIVE Final    Comment: (NOTE) SARS-CoV-2 target nucleic acids are NOT DETECTED.  The SARS-CoV-2 RNA is generally detectable in upper and lower respiratory specimens during the acute phase of infection. Negative results do not preclude SARS-CoV-2 infection, do not rule out co-infections with other pathogens, and should not be used as the sole basis for treatment or other patient management decisions. Negative results must be  combined with clinical observations, patient history, and epidemiological information. The expected result is Negative.  Fact Sheet for  Patients: SugarRoll.be  Fact Sheet for Healthcare Providers: https://www.woods-mathews.com/  This test is not yet approved or cleared by the [place] FDA and  has been authorized for detection and/or diagnosis of SARS-CoV-2 by FDA under an Emergency Use Authorization (EUA). This EUA will remain  in effect (meaning this test can be used) for the duration of the COVID-19 declaration under Se ction 564(b)(1) of the Act, 21 U.S.C. section 360bbb-3(b)(1), unless the authorization is terminated or revoked sooner.  Performed at [facility], [place] [address] [place], [place] [address]     Time coordinating discharge: Over 30 minutes  SIGNED:  Lorella Nimrod, MD  [facility] 06/24/2020, 10:47 AM  If 7PM-7AM, please contact night-coverage [email or web address]  This record has been created using Systems analyst. Errors have been sought and corrected,but may not always be located. Such creation errors do not reflect on the standard of care.

## 2020-06-25 ENCOUNTER — Ambulatory Visit: Payer: Self-pay

## 2020-06-25 DIAGNOSIS — R531 Weakness: Secondary | ICD-10-CM

## 2020-06-25 DIAGNOSIS — R42 Dizziness and giddiness: Secondary | ICD-10-CM

## 2020-06-25 NOTE — Telephone Encounter (Signed)
Please review. Thanks!  

## 2020-06-25 NOTE — Telephone Encounter (Signed)
[name] sister in law who is on DPR is calling she reviewed the patient ct scan of head on mychart and she believes the patient has had a stroke. Pt is in facility  and [name] called the cardiologist and was told to call pcp to get a referral to neurologist and MRI per ct scan of head report. [facility] next opening is not until 07-15-2020.  Pt sister in law did not mention the patient having any symptom since discharge. Please advise    Sister-in-law concerned about CT Scan results from ED. Pt. Is [place] having dizziness and left sided weakness. Was having these symptoms in the E. Would to have an office visit as soon as possible. Please advise

## 2020-06-26 NOTE — Telephone Encounter (Signed)
Simona Huh, can you review this?

## 2020-06-27 DIAGNOSIS — K529 Noninfective gastroenteritis and colitis, unspecified: Secondary | ICD-10-CM | POA: Diagnosis not present

## 2020-06-27 DIAGNOSIS — W19XXXA Unspecified fall, initial encounter: Secondary | ICD-10-CM | POA: Diagnosis not present

## 2020-06-27 DIAGNOSIS — I4891 Unspecified atrial fibrillation: Secondary | ICD-10-CM | POA: Diagnosis not present

## 2020-06-27 DIAGNOSIS — M6281 Muscle weakness (generalized): Secondary | ICD-10-CM | POA: Diagnosis not present

## 2020-06-29 NOTE — Telephone Encounter (Signed)
Recommend referral to neurologist for dizziness and left sided weakness secondary to suspected stroke. If more than a few days to get appointment, will schedule MRI scan or should return to ER at [facility] if symptoms worsening.

## 2020-06-30 DIAGNOSIS — K529 Noninfective gastroenteritis and colitis, unspecified: Secondary | ICD-10-CM | POA: Diagnosis not present

## 2020-06-30 DIAGNOSIS — I4891 Unspecified atrial fibrillation: Secondary | ICD-10-CM | POA: Diagnosis not present

## 2020-06-30 NOTE — Telephone Encounter (Signed)
Order for referral placed.

## 2020-06-30 NOTE — Addendum Note (Signed)
Addended by: Wilburt Finlay on: 06/30/2020 02:34 PM   Modules accepted: Orders

## 2020-07-03 DIAGNOSIS — I4891 Unspecified atrial fibrillation: Secondary | ICD-10-CM | POA: Diagnosis not present

## 2020-07-03 DIAGNOSIS — M6281 Muscle weakness (generalized): Secondary | ICD-10-CM | POA: Diagnosis not present

## 2020-07-03 DIAGNOSIS — K529 Noninfective gastroenteritis and colitis, unspecified: Secondary | ICD-10-CM | POA: Diagnosis not present

## 2020-07-08 DIAGNOSIS — M6281 Muscle weakness (generalized): Secondary | ICD-10-CM | POA: Diagnosis not present

## 2020-07-08 DIAGNOSIS — I4891 Unspecified atrial fibrillation: Secondary | ICD-10-CM | POA: Diagnosis not present

## 2020-07-08 DIAGNOSIS — I2510 Atherosclerotic heart disease of native coronary artery without angina pectoris: Secondary | ICD-10-CM | POA: Diagnosis not present

## 2020-07-08 DIAGNOSIS — K529 Noninfective gastroenteritis and colitis, unspecified: Secondary | ICD-10-CM | POA: Diagnosis not present

## 2020-07-08 DIAGNOSIS — F339 Major depressive disorder, recurrent, unspecified: Secondary | ICD-10-CM | POA: Diagnosis not present

## 2020-07-09 ENCOUNTER — Telehealth: Payer: Self-pay

## 2020-07-09 NOTE — Telephone Encounter (Signed)
Copied from [place] [id number]. Topic: General - Other >> Jul 09, 2020  8:17 AM Loma [place] wrote: Reason for CRM: [facility] called Santiago Glad) stating that pt is going to be discharged weekend and they are going to use Dr Rosanna Randy to sign off on [facility] orders until replacement for [facility]. They will be contacting by week end. No futher issues at present. They will reach out.

## 2020-07-09 NOTE — Telephone Encounter (Signed)
FYI. Thanks.

## 2020-07-11 ENCOUNTER — Emergency Department: Payer: Medicare Other

## 2020-07-11 ENCOUNTER — Other Ambulatory Visit: Payer: Self-pay

## 2020-07-11 ENCOUNTER — Emergency Department
Admission: EM | Admit: 2020-07-11 | Discharge: 2020-07-11 | Disposition: A | Discharge status: Home / Self Care | Payer: Medicare Other | Attending: Emergency Medicine | Admitting: Emergency Medicine

## 2020-07-11 DIAGNOSIS — I509 Heart failure, unspecified: Secondary | ICD-10-CM | POA: Diagnosis not present

## 2020-07-11 DIAGNOSIS — I110 Hypertensive heart disease with heart failure: Secondary | ICD-10-CM | POA: Insufficient documentation

## 2020-07-11 DIAGNOSIS — Z8541 Personal history of malignant neoplasm of cervix uteri: Secondary | ICD-10-CM | POA: Insufficient documentation

## 2020-07-11 DIAGNOSIS — Z7901 Long term (current) use of anticoagulants: Secondary | ICD-10-CM | POA: Insufficient documentation

## 2020-07-11 DIAGNOSIS — S52511A Displaced fracture of right radial styloid process, initial encounter for closed fracture: Secondary | ICD-10-CM | POA: Diagnosis not present

## 2020-07-11 DIAGNOSIS — E119 Type 2 diabetes mellitus without complications: Secondary | ICD-10-CM | POA: Insufficient documentation

## 2020-07-11 DIAGNOSIS — W01198A Fall on same level from slipping, tripping and stumbling with subsequent striking against other object, initial encounter: Secondary | ICD-10-CM | POA: Insufficient documentation

## 2020-07-11 DIAGNOSIS — Z91040 Latex allergy status: Secondary | ICD-10-CM | POA: Insufficient documentation

## 2020-07-11 DIAGNOSIS — Z79899 Other long term (current) drug therapy: Secondary | ICD-10-CM | POA: Diagnosis not present

## 2020-07-11 DIAGNOSIS — Y92129 Unspecified place in nursing home as the place of occurrence of the external cause: Secondary | ICD-10-CM | POA: Diagnosis not present

## 2020-07-11 DIAGNOSIS — S6991XA Unspecified injury of right wrist, hand and finger(s), initial encounter: Secondary | ICD-10-CM | POA: Diagnosis present

## 2020-07-11 DIAGNOSIS — Z87891 Personal history of nicotine dependence: Secondary | ICD-10-CM | POA: Diagnosis not present

## 2020-07-11 DIAGNOSIS — I4891 Unspecified atrial fibrillation: Secondary | ICD-10-CM | POA: Diagnosis not present

## 2020-07-11 DIAGNOSIS — R519 Headache, unspecified: Secondary | ICD-10-CM | POA: Diagnosis not present

## 2020-07-11 DIAGNOSIS — I10 Essential (primary) hypertension: Secondary | ICD-10-CM | POA: Diagnosis not present

## 2020-07-11 DIAGNOSIS — S0990XA Unspecified injury of head, initial encounter: Secondary | ICD-10-CM | POA: Diagnosis not present

## 2020-07-11 DIAGNOSIS — S52501A Unspecified fracture of the lower end of right radius, initial encounter for closed fracture: Secondary | ICD-10-CM | POA: Insufficient documentation

## 2020-07-11 DIAGNOSIS — I2510 Atherosclerotic heart disease of native coronary artery without angina pectoris: Secondary | ICD-10-CM | POA: Diagnosis not present

## 2020-07-11 DIAGNOSIS — Z955 Presence of coronary angioplasty implant and graft: Secondary | ICD-10-CM | POA: Diagnosis not present

## 2020-07-11 DIAGNOSIS — I959 Hypotension, unspecified: Secondary | ICD-10-CM | POA: Diagnosis not present

## 2020-07-11 DIAGNOSIS — R001 Bradycardia, unspecified: Secondary | ICD-10-CM | POA: Diagnosis not present

## 2020-07-11 DIAGNOSIS — W19XXXA Unspecified fall, initial encounter: Secondary | ICD-10-CM | POA: Diagnosis not present

## 2020-07-11 DIAGNOSIS — I639 Cerebral infarction, unspecified: Secondary | ICD-10-CM | POA: Diagnosis not present

## 2020-07-11 DIAGNOSIS — M25531 Pain in right wrist: Secondary | ICD-10-CM | POA: Diagnosis not present

## 2020-07-11 DIAGNOSIS — S52571A Other intraarticular fracture of lower end of right radius, initial encounter for closed fracture: Secondary | ICD-10-CM | POA: Diagnosis not present

## 2020-07-11 MED ORDER — OXYCODONE-ACETAMINOPHEN 5-325 MG PO TABS: 1.0000 | ORAL_TABLET | ORAL | 0 refills | Status: AC | PRN

## 2020-07-11 MED ORDER — LIDOCAINE-EPINEPHRINE 2 %-1:100000 IJ SOLN
20.0000 mL | Freq: Once | INTRAMUSCULAR | Status: AC
  Administered 2020-07-11: 20 mL
  Filled 2020-07-11: qty 1

## 2020-07-11 MED ORDER — FENTANYL CITRATE (PF) 100 MCG/2ML IJ SOLN
50.0000 ug | Freq: Once | INTRAMUSCULAR | Status: DC
  Filled 2020-07-11: qty 2

## 2020-07-11 MED ORDER — ONDANSETRON HCL 4 MG/2ML IJ SOLN
4.0000 mg | Freq: Once | INTRAMUSCULAR | Status: DC
  Filled 2020-07-11: qty 2

## 2020-07-11 MED ORDER — OXYCODONE-ACETAMINOPHEN 5-325 MG PO TABS
1.0000 | ORAL_TABLET | Freq: Once | ORAL | Status: AC
Start: 2020-07-11 — End: 2020-07-11
  Administered 2020-07-11: 1 via ORAL
  Filled 2020-07-11: qty 1

## 2020-07-11 MED ORDER — FENTANYL CITRATE (PF) 100 MCG/2ML IJ SOLN
50.0000 ug | Freq: Once | INTRAMUSCULAR | Status: AC
Start: 2020-07-11 — End: 2020-07-11
  Administered 2020-07-11: 50 ug via INTRAVENOUS

## 2020-07-11 MED ORDER — ONDANSETRON HCL 4 MG/2ML IJ SOLN
4.0000 mg | Freq: Once | INTRAMUSCULAR | Status: AC
  Administered 2020-07-11: 4 mg via INTRAVENOUS

## 2020-07-11 MED ORDER — MORPHINE SULFATE (PF) 4 MG/ML IV SOLN
4.0000 mg | Freq: Once | INTRAVENOUS | Status: AC
  Administered 2020-07-11: 4 mg via INTRAVENOUS
  Filled 2020-07-11: qty 1

## 2020-07-11 NOTE — ED Notes (Signed)
Pt to XRAY and CT

## 2020-07-11 NOTE — ED Provider Notes (Addendum)
[facility] Emergency Department Provider Note  ____________________________________________  Time seen: Approximately 2:57 PM  I have reviewed the triage vital signs and the nursing notes.   HISTORY  Chief Complaint Fall    HPI Kayla Maxwell is a 75 y.o. female with a history of CHF, atrial fibrillation, hyperlipidemia, prior stroke on Xarelto who had a fall last night hitting her head.  Has some posterior headache from where she hit, no bleeding, no neck pain, no vision changes or paresthesias or motor weakness.  Also complains of right wrist and has a deformity.  Wrist pain is severe, constant, worse with movement, no alleviating factors, nonradiating.  No hand numbness    Past Medical History:  Diagnosis Date   Acid reflux    takes Nexium daily   Allergy    takes Singulair daily as needed   Anemia    Atrial fibrillation (HCC)    Cancer (HCC) AGE 36   CERVICAL cancer with removal   CHF (congestive heart failure) (HCC)    Chronic back pain    stenosis   Complication of anesthesia    Constipation    takes Colace daily as needed   Coronary artery disease    Difficult intubation    Dysrhythmia    atrial fibrillation dx 04/2014   Headache    sinus    History of blood transfusion    History of shingles    Hyperlipidemia    takes Pravastatin daily-started taking this 6 months ago   Hypertension    Lumbar surgical wound fluid collection    Sleep apnea    CPAP   Stroke ([place])    TIA'S X 2 --- one last yr and one this yr...lost her memory over 4-6 hrs.   Stroke [facility]) 06-04-15   left with right side weakness   TIA (transient ischemic attack)    showed up on an MRI but she never knew it   Urinary frequency      Patient Active Problem List   Diagnosis Date Noted   Chronic congestive heart failure ([place])    Generalized abdominal pain    Influenza-like illness    Type 2 diabetes mellitus without complication, without long-term current use of  insulin (HCC)    Intractable nausea and vomiting 06/21/2020   Myocardial infarction ([place]) 02/12/2020   Atheroembolism of foot, left ([facility]) 01/11/2019   Venous insufficiency of both lower extremities 11/02/2017   Pain and swelling of lower leg 06/20/2017   Benign essential HTN 11/01/2016   SOBOE (shortness of breath on exertion) 11/01/2016   Gastroenteritis 08/16/2016   Bilateral carotid artery stenosis 03/12/2016   Fine tremor 03/12/2016   PAD (peripheral artery disease) ([place]) 01/14/2016   Atherosclerosis of native arteries of extremity with rest pain ([place]) 11/10/2015   Cough 06/09/2015   Abnormal blood sugar 04/11/2015   Anxiety 11/19/2014   PTSD (post-traumatic stress disorder) 10/29/2014   Allergic rhinitis 09/05/2014   Mixed hyperlipidemia 09/05/2014   Mild memory disturbance 09/05/2014   H/O stroke without residual deficits 09/05/2014   Edema 09/05/2014   Spinal stenosis, lumbar 07/04/2014   MI (mitral incompetence) 05/08/2014   TI (tricuspid incompetence) 05/08/2014   Atrial fibrillation ([place]) 05/08/2014   History of tobacco abuse 04/29/2014   Gastro-esophageal reflux disease without esophagitis 04/29/2014   Obstructive apnea 04/29/2014   Atrial fibrillation by electrocardiography ([place]) 04/29/2014   Neuritis or radiculitis due to rupture of lumbar intervertebral disc 09/21/2013   DDD (degenerative disc disease), lumbar 08/24/2013  Bursitis, trochanteric 06/18/2013   Nerve root inflammation 06/18/2013   Arthralgia of hip 06/04/2013   Calculus of gallbladder with other cholecystitis, without mention of obstruction 04/27/2012     Past Surgical History:  Procedure Laterality Date   ABDOMINAL HYSTERECTOMY     APPENDECTOMY  1963   BACK SURGERY  08/19/2014   Dr. Hal Neer ([place] [facility])   [place]  2014   Dr Jamal Collin   COLONOSCOPY     CORONARY STENT INTERVENTION N/A 02/13/2020   Procedure: CORONARY  STENT INTERVENTION;  Surgeon: Yolonda Kida, MD;  Location: [place] [facility];  Service: Cardiovascular;  Laterality: N/A;   ESOPHAGOGASTRODUODENOSCOPY     HAMMER TOE SURGERY Bilateral    LEFT HEART CATH AND CORONARY ANGIOGRAPHY N/A 02/13/2020   Procedure: LEFT HEART CATH AND CORONARY ANGIOGRAPHY and possible PCI and stent;  Surgeon: Yolonda Kida, MD;  Location: [place] [facility];  Service: Cardiovascular;  Laterality: N/A;   LOWER EXTREMITY ANGIOGRAPHY Left 01/23/2019   Procedure: LOWER EXTREMITY ANGIOGRAPHY;  Surgeon: Katha Cabal, MD;  Location: [place] [facility];  Service: Cardiovascular;  Laterality: Left;   LOWER EXTREMITY ANGIOGRAPHY Left 02/20/2019   Procedure: LOWER EXTREMITY ANGIOGRAPHY;  Surgeon: Katha Cabal, MD;  Location: [place] [facility];  Service: Cardiovascular;  Laterality: Left;   LUMBAR LAMINECTOMY WITH COFLEX 1 LEVEL Bilateral 07/04/2014   Procedure: Laminectomy and Foraminotomy - bilateral - Lumbar Four-Five with coflex ;  Surgeon: Karie Chimera, MD;  Location: [place] [facility];  Service: Neurosurgery;  Laterality: Bilateral;   LUMBAR WOUND DEBRIDEMENT N/A 08/19/2014   Procedure: Exploration of Lumbar wound;  Surgeon: Karie Chimera, MD;  Location: [place] [facility];  Service: Neurosurgery;  Laterality: N/A;   PERIPHERAL VASCULAR CATHETERIZATION Left 08/12/2015   Procedure: Lower Extremity Angiography;  Surgeon: Katha Cabal, MD;  Location: [place] [facility];  Service: Cardiovascular;  Laterality: Left;   TONSILLECTOMY     VENTRAL HERNIA REPAIR N/A 05/26/2015   Procedure: HERNIA REPAIR VENTRAL ADULT;  Surgeon: Christene Lye, MD;  Location: [facility];  Service: General;  Laterality: N/A;     Prior to Admission medications   Medication Sig Start Date End Date Taking? Authorizing Provider  famotidine (PEPCID) 20 MG tablet Take 1 tablet (20 mg total) by mouth 2 (two) times daily. 06/20/20  Yes Carrie Mew, MD  FLUoxetine (PROZAC) 20 MG  capsule TAKE 1 CAPSULE BY MOUTH EVERY DAY Patient taking differently: Take 20 mg by mouth daily. 02/29/20  Yes Mar Daring, PA-C  furosemide (LASIX) 20 MG tablet Take 1 tablet (20 mg total) by mouth daily as needed for fluid or edema. 02/15/20  Yes Lorella Nimrod, MD  gabapentin (NEURONTIN) 300 MG capsule Take 1 capsule (300 mg total) by mouth 2 (two) times daily. 06/24/20  Yes Lorella Nimrod, MD  HYDROcodone-acetaminophen (NORCO) 5-325 MG tablet Take 1-2 tablets by mouth every 8 (eight) hours as needed for moderate pain or severe pain. 05/30/20  Yes Bacigalupo, Dionne Bucy, MD  lisinopril (ZESTRIL) 2.5 MG tablet TAKE 1 TABLET BY MOUTH EVERY DAY 02/29/20  Yes Fenton Malling M, PA-C  metoprolol tartrate (LOPRESSOR) 25 MG tablet TAKE 2 TABLETS BY MOUTH TWICE A DAY Patient taking differently: Take 50 mg by mouth 2 (two) times daily. 03/13/20  Yes Mar Daring, PA-C  nitroGLYCERIN (NITROSTAT) 0.4 MG SL tablet Place 1 tablet (0.4 mg total) under the tongue every  5 (five) minutes x 3 doses as needed for chest pain. 02/15/20  Yes Lorella Nimrod, MD  omeprazole (PRILOSEC) 40 MG capsule TAKE 1 CAPSULE BY MOUTH EVERY DAY Patient taking differently: Take 40 mg by mouth daily. 03/03/20  Yes Mar Daring, PA-C  ondansetron (ZOFRAN ODT) 4 MG disintegrating tablet Take 1 tablet (4 mg total) by mouth every 8 (eight) hours as needed for nausea or vomiting. 06/20/20  Yes Carrie Mew, MD  oxyCODONE-acetaminophen (PERCOCET) 5-325 MG tablet Take 1 tablet by mouth every 4 (four) hours as needed for up to 3 days for severe pain. 07/11/20 07/14/20 Yes Carrie Mew, MD  rivaroxaban (XARELTO) 20 MG TABS tablet Take 20 mg by mouth daily with supper.  08/27/14  Yes [provider]  rosuvastatin (CRESTOR) 20 MG tablet Take 1 tablet (20 mg total) by mouth daily. 02/15/20  Yes Lorella Nimrod, MD  senna (SENOKOT) 8.6 MG TABS tablet Take 1 tablet by mouth daily.   Yes [provider]  albuterol  (PROVENTIL HFA;VENTOLIN HFA) 108 (90 Base) MCG/ACT inhaler Inhale 2 puffs into the lungs every 6 (six) hours as needed for wheezing or shortness of breath. 06/09/16   Mar Daring, PA-C     Allergies Codeine, Penicillins, Latex, Adhesive [tape], and Sulfa antibiotics   Family History  Problem Relation Age of Onset   Alzheimer's disease Mother    Heart attack Father    Pancreatic cancer Sister    Healthy Brother     Social History Social History   Tobacco Use   Smoking status: Former    Packs/day: 1.00    Years: 47.00    Pack years: 47.00    Types: Cigarettes    Quit date: 08/19/2014    Years since quitting: 5.8   Smokeless tobacco: Former    Quit date: 08/19/2014  Vaping Use   Vaping Use: Never used  Substance Use Topics   Alcohol use: No   Drug use: No    Review of Systems  Constitutional:   No fever or chills.  ENT:   No sore throat. No rhinorrhea. Cardiovascular:   No chest pain or syncope. Respiratory:   No dyspnea or cough. Gastrointestinal:   Negative for abdominal pain, vomiting and diarrhea.  Musculoskeletal:   Right wrist pain All other systems reviewed and are negative except as documented above in ROS and HPI.  ____________________________________________   PHYSICAL EXAM:  VITAL SIGNS: ED Triage Vitals  Enc Vitals Group     BP 07/11/20 1213 93/64     Pulse Rate 07/11/20 1213 76     Resp 07/11/20 1213 18     Temp 07/11/20 1213 97.8 F (36.6 C)     Temp Source 07/11/20 1213 Oral     SpO2 07/11/20 1213 98 %     Weight 07/11/20 1214 156 lb 8.4 oz (71 kg)     Height 07/11/20 1214 5\' 5"  (1.651 m)     Head Circumference --      [place] Flow --      Pain Score 07/11/20 1214 6     Pain Loc --      Pain Edu? --      Excl. in [place]? --     Vital signs reviewed, nursing assessments reviewed.   Constitutional:   Alert and oriented. Non-toxic appearance. Eyes:   Conjunctivae are normal. EOMI. PERRL. ENT      Head:   Normocephalic and  atraumatic.      Nose:   Wearing  a mask.      Mouth/Throat:   Wearing a mask.      Neck:   No meningismus. Full ROM.  No midline tenderness Hematological/Lymphatic/Immunilogical:   No cervical lymphadenopathy. Cardiovascular:   RRR. Symmetric bilateral radial and DP pulses.  No murmurs. Cap refill less than 2 seconds. Respiratory:   Normal respiratory effort without tachypnea/retractions. Breath sounds are clear and equal bilaterally. No wheezes/rales/rhonchi. Gastrointestinal:   Soft and nontender. Non distended. There is no CVA tenderness.  No rebound, rigidity, or guarding. Genitourinary:   deferred Musculoskeletal:   Dinner fork deformity of right wrist with dorsal displacement of the hand.  Intact distal perfusion and motor function.  Skeletal exam otherwise normal, pelvis and hips stable.. Neurologic:   Normal speech and language.  Motor grossly intact. No acute focal neurologic deficits are appreciated.  Skin:    Skin is warm, dry and intact. No rash noted.  No petechiae, purpura, or bullae.  ____________________________________________    LABS (pertinent positives/negatives) (all labs ordered are listed, but only abnormal results are displayed) Labs Reviewed - No data to display ____________________________________________   EKG  Interpreted by me Atrial fibrillation rate of 63.  Normal axis and intervals.  Poor R wave progression.  Normal ST segments and T waves.  ____________________________________________    RADIOLOGY  DG Forearm Right  Result Date: 07/11/2020 CLINICAL DATA:  Right wrist pain after fall last night. EXAM: RIGHT FOREARM - 2 VIEW COMPARISON:  None. FINDINGS: Severely comminuted and displaced fracture is seen involving the distal right radius with intra-articular extension. No soft tissue abnormality is noted. IMPRESSION: Severely comminuted and displaced distal right radial fracture is noted with intra-articular extension. Electronically Signed   By:  Marijo Conception M.D.   On: 07/11/2020 13:23   CT Head Wo Contrast  Result Date: 07/11/2020 CLINICAL DATA:  Fall, hit head, on anticoagulation EXAM: CT HEAD WITHOUT CONTRAST TECHNIQUE: Contiguous axial images were obtained from the base of the skull through the vertex without intravenous contrast. COMPARISON:  06/23/2020 FINDINGS: Brain: There is no acute intracranial hemorrhage or mass effect. No new loss of gray-white differentiation. Chronic left parietal infarct is again noted. There is a chronic small vessel infarct of the left putamen. Area of low attenuation in the left cerebellum is no longer present. Additional patchy low-attenuation in the supratentorial white matter is nonspecific but probably reflects stable chronic microvascular ischemic changes. Ventricles and sulci are stable in size and configuration. Vascular: There is atherosclerotic calcification at the skull base. Skull: Calvarium is unremarkable. Sinuses/Orbits: No acute finding. Other: None. IMPRESSION: No evidence of acute intracranial injury. Area of low density in the left cerebellum is no longer present. This is more in keeping with either artifact or evolution of infarction rather than an underlying lesion. Additional stable findings detailed above. Electronically Signed   By: Macy Mis M.D.   On: 07/11/2020 13:27    ____________________________________________   PROCEDURES .Ortho Injury Treatment  Date/Time: 07/11/2020 3:13 PM Performed by: Carrie Mew, MD Authorized by: Carrie Mew, MD   Consent:    Consent obtained:  Verbal   Consent given by:  Patient   Risks discussed:  Fracture, stiffness and vascular damage   Alternatives discussed:  ImmobilizationInjury location: forearm Location details: right forearm Injury type: fracture Fracture type: distal radius Pre-procedure neurovascular assessment: neurovascularly intact Pre-procedure distal perfusion: normal Pre-procedure neurological function:  normal Pre-procedure range of motion: reduced Anesthesia: hematoma block  Anesthesia: Local anesthesia used: yes Local Anesthetic: lidocaine 2% with  epinephrine Anesthetic total: 10 mL  Patient sedated: NoManipulation performed: yes Skeletal traction used: yes Reduction successful: yes X-ray confirmed reduction: yes Immobilization: splint Splint type: sugar tong Splint Applied by: ED Nurse and ED Provider Supplies used: cotton padding, elastic bandage and Ortho-Glass Post-procedure neurovascular assessment: post-procedure neurovascularly intact Post-procedure distal perfusion: normal Post-procedure neurological function: normal Post-procedure range of motion: unchanged    ____________________________________________  DIFFERENTIAL DIAGNOSIS   Intracranial hemorrhage, distal forearm fracture  CLINICAL IMPRESSION / ASSESSMENT AND PLAN / ED COURSE  Medications ordered in the ED: Medications  fentaNYL (SUBLIMAZE) injection 50 mcg (50 mcg Intravenous Given 07/11/20 1232)  ondansetron (ZOFRAN) injection 4 mg (4 mg Intravenous Given 07/11/20 1231)  lidocaine-EPINEPHrine (XYLOCAINE W/EPI) 2 %-1:100000 (with pres) injection 20 mL (20 mLs Infiltration Given by Other 07/11/20 1440)  morphine 4 MG/ML injection 4 mg (4 mg Intravenous Given 07/11/20 1409)    Pertinent labs & imaging results that were available during my care of the patient were reviewed by me and considered in my medical decision making (see chart for details).  Kayla Maxwell was evaluated in Emergency Department on 07/11/2020 for the symptoms described in the history of present illness. She was evaluated in the context of the global COVID-19 pandemic, which necessitated consideration that the patient might be at risk for infection with the SARS-CoV-2 virus that causes COVID-19. Institutional protocols and algorithms that pertain to the evaluation of patients at risk for COVID-19 are in a state of rapid change based on information  released by regulatory bodies including the CDC and federal and state organizations. These policies and algorithms were followed during the patient's care in the ED.   Patient presents after fall at her residential facility last night.  Due to Xarelto use, CT head obtained to evaluate for intracranial hemorrhage which is negative.  X-ray of the right forearm viewed and interpreted by me, shows distal radius fracture with shortening and dorsal displacement.  Discussed with orthopedics Dr. Posey Pronto who agrees with reduction as much as feasible and splinting for outpatient follow-up. No si/sx of compartment syndrome. No open frx.   Clinical Course as of 07/11/20 1513  Fri Jul 11, 2020  1444 Fracture reduced and splinted with hematoma block.  Awaiting repeat x-ray, plan for discharge to follow-up with orthopedics Patel/Menz [PS]    Clinical Course User Index [PS] Carrie Mew, MD     ----------------------------------------- 3:15 PM on 07/11/2020 ----------------------------------------- Repeat x-ray reviewed by me, improved alignment, stable for discharge  ____________________________________________   FINAL CLINICAL IMPRESSION(S) / ED DIAGNOSES    Final diagnoses:  Closed traumatic displaced fracture of distal end of right radius, initial encounter     ED Discharge Orders          Ordered    oxyCODONE-acetaminophen (PERCOCET) 5-325 MG tablet  Every 4 hours PRN        07/11/20 1449            Portions of this note were generated with dragon dictation software. Dictation errors may occur despite best attempts at proofreading.    Carrie Mew, MD 07/11/20 1501    Carrie Mew, MD 07/11/20 1515

## 2020-07-11 NOTE — ED Notes (Signed)
Pt provided with applesauce.

## 2020-07-11 NOTE — Progress Notes (Signed)
   07/11/20 1535  Clinical Encounter Type  Visited With Patient and family together  Visit Type Spiritual support;Social support;ED  Referral From Other (Comment) (rounding)  Spiritual Encounters  Spiritual Needs Prayer;Emotional  Chaplain Burris encountered PT in hallway and asked how she might be of support. Ms. Mccumbers described events that led to her coming to ED and asked for prayer. Chaplain Burris prayed with Ms. Bergeman. PT's daughter was standing by and Daryel November also checked in with her. Daughter became tearful and chaplain extended support to her as well.

## 2020-07-11 NOTE — ED Notes (Signed)
Chaplain at bedside

## 2020-07-11 NOTE — ED Triage Notes (Signed)
[facility], reports hitting head and takes xarelto. No bruising noted Reports fx to right radius d/t fall.  Pt alert and oriented

## 2020-07-11 NOTE — ED Notes (Signed)
Splint applied with Dr Joni Fears

## 2020-07-11 NOTE — ED Notes (Signed)
Obvious swelling noted to right wrist

## 2020-07-14 DIAGNOSIS — K529 Noninfective gastroenteritis and colitis, unspecified: Secondary | ICD-10-CM | POA: Diagnosis not present

## 2020-07-14 DIAGNOSIS — S52511A Displaced fracture of right radial styloid process, initial encounter for closed fracture: Secondary | ICD-10-CM | POA: Diagnosis not present

## 2020-07-14 DIAGNOSIS — I4891 Unspecified atrial fibrillation: Secondary | ICD-10-CM | POA: Diagnosis not present

## 2020-07-16 ENCOUNTER — Other Ambulatory Visit: Payer: Self-pay | Admitting: Orthopedic Surgery

## 2020-07-16 DIAGNOSIS — S52531A Colles' fracture of right radius, initial encounter for closed fracture: Secondary | ICD-10-CM | POA: Diagnosis not present

## 2020-07-16 DIAGNOSIS — I509 Heart failure, unspecified: Secondary | ICD-10-CM

## 2020-07-17 ENCOUNTER — Encounter
Admission: RE | Admit: 2020-07-17 | Discharge: 2020-07-17 | Disposition: A | Discharge status: Home / Self Care | Payer: Medicare Other | Source: Ambulatory Visit | Attending: Orthopedic Surgery | Admitting: Orthopedic Surgery

## 2020-07-17 ENCOUNTER — Other Ambulatory Visit: Payer: Self-pay

## 2020-07-17 DIAGNOSIS — S52511A Displaced fracture of right radial styloid process, initial encounter for closed fracture: Secondary | ICD-10-CM | POA: Diagnosis not present

## 2020-07-17 DIAGNOSIS — I4891 Unspecified atrial fibrillation: Secondary | ICD-10-CM | POA: Diagnosis not present

## 2020-07-17 NOTE — Pre-Procedure Instructions (Signed)
Kayla Maxwell said that she talked to the daughter and to Dr Rudene Christians -he feels like there might be extra bleeding but it would be alright. Continue with surgery as planned for July 15.

## 2020-07-17 NOTE — Pre-Procedure Instructions (Signed)
Alyson at Dr Rudene Christians office notified of clearance note : hold plavix and xeralto for 4 days before surgery.

## 2020-07-17 NOTE — Patient Instructions (Addendum)
Your procedure is scheduled on: July 18, 2020 FRIDAY Report to the Registration Desk on the 1st floor of the [facility] AT 10:45 AM.   REMEMBER: Instructions that are not followed completely may result in serious medical risk, up to and including death; or upon the discretion of your surgeon and anesthesiologist your surgery may need to be rescheduled.  Do not eat food after midnight the night before surgery.  No gum chewing, lozengers or hard candies.  You may however, drink CLEAR liquids up to 2 hours before you are scheduled to arrive for your surgery. Do not drink anything within 2 hours of your scheduled arrival time.  Clear liquids include: - water   Do NOT drink anything that is not on this list.  TAKE THESE MEDICATIONS THE MORNING OF SURGERY WITH A SIP OF WATER: GABAPENTIN METOPROLOL PAIN MED IF NEEDED FAMOTIDINE (take one the night before and one on the morning of surgery - helps to prevent nausea after surgery.)  Use inhalers on the day of surgery  Follow recommendations from Cardiologist, Pulmonologist or PCP regarding stopping Aspirin, Coumadin, Plavix, Eliquis, Pradaxa, or Pletal.  One week prior to surgery: Stop Anti-inflammatories (NSAIDS) such as Advil, Aleve, Ibuprofen, Motrin, Naproxen, Naprosyn and Aspirin based products such as Excedrin, Goodys Powder, BC Powder. Stop ANY OVER THE COUNTER supplements until after surgery. You may however, continue to take Tylenol if needed for pain up until the day of surgery.  No Alcohol for 24 hours before or after surgery.  No Smoking including e-cigarettes for 24 hours prior to surgery.  No chewable tobacco products for at least 6 hours prior to surgery.  No nicotine patches on the day of surgery.  Do not use any "recreational" drugs for at least a week prior to your surgery.  Please be advised that the combination of cocaine and anesthesia may have negative outcomes, up to and including death. If you test positive  for cocaine, your surgery will be cancelled.  On the morning of surgery brush your teeth with toothpaste and water, you may rinse your mouth with mouthwash if you wish. Do not swallow any toothpaste or mouthwash.  Do not wear jewelry, make-up, hairpins, clips or nail polish.  Do not wear lotions, powders, or perfumes OR DEODORANT   Do not shave body from the neck down 48 hours prior to surgery just in case you cut yourself which could leave a site for infection.  Also, freshly shaved skin may become irritated if using the CHG soap.  Contact lenses, hearing aids and dentures may not be worn into surgery.  Do not bring valuables to the hospital. [facility] is not responsible for any missing/lost belongings or valuables.   SHOWER THE MORNING OF SURGERY  Notify your doctor if there is any change in your medical condition (cold, fever, infection).  Wear comfortable clothing (specific to your surgery type) to the hospital.  After surgery, you can help prevent lung complications by doing breathing exercises.  Take deep breaths and cough every 1-2 hours. Your doctor may order a device called an Incentive Spirometer to help you take deep breaths. When coughing or sneezing, hold a pillow firmly against your incision with both hands. This is called "splinting." Doing this helps protect your incision. It also decreases belly discomfort.  If you are being discharged the day of surgery, you will not be allowed to drive home. You will need a responsible adult (18 years or older) to drive you home and stay with  you that night.   If you are taking public transportation, you will need to have a responsible adult (18 years or older) with you. Please confirm with your physician that it is acceptable to use public transportation.   Please call the [place] Dept. at [phone] if you have any questions about these instructions.  Surgery Visitation Policy:  Patients undergoing a  surgery or procedure may have one family member or support person with them as long as that person is not COVID-19 positive or experiencing its symptoms.  That person may remain in the waiting area during the procedure.  Inpatient Visitation:    Visiting hours are 7 a.m. to 8 p.m. Inpatients will be allowed two visitors daily. The visitors may change each day during the patient's stay. No visitors under the age of 77. Any visitor under the age of 60 must be accompanied by an adult. The visitor must pass COVID-19 screenings, use hand sanitizer when entering and exiting the patient's room and wear a mask at all times, including in the patient's room. Patients must also wear a mask when staff or their visitor are in the room. Masking is required regardless of vaccination status.

## 2020-07-18 ENCOUNTER — Ambulatory Visit
Admission: RE | Admit: 2020-07-18 | Discharge: 2020-07-18 | Disposition: A | Discharge status: Home / Self Care | Payer: Medicare Other | Attending: Orthopedic Surgery | Admitting: Orthopedic Surgery

## 2020-07-18 ENCOUNTER — Ambulatory Visit: Payer: Medicare Other | Admitting: Urgent Care

## 2020-07-18 ENCOUNTER — Encounter
Admission: RE | Disposition: A | Discharge status: Home / Self Care | Payer: Self-pay | Source: Home / Self Care | Attending: Orthopedic Surgery

## 2020-07-18 ENCOUNTER — Other Ambulatory Visit: Payer: Self-pay

## 2020-07-18 ENCOUNTER — Ambulatory Visit: Payer: Medicare Other

## 2020-07-18 DIAGNOSIS — Z79899 Other long term (current) drug therapy: Secondary | ICD-10-CM | POA: Diagnosis not present

## 2020-07-18 DIAGNOSIS — Z91040 Latex allergy status: Secondary | ICD-10-CM | POA: Insufficient documentation

## 2020-07-18 DIAGNOSIS — Z882 Allergy status to sulfonamides status: Secondary | ICD-10-CM | POA: Diagnosis not present

## 2020-07-18 DIAGNOSIS — S52531A Colles' fracture of right radius, initial encounter for closed fracture: Secondary | ICD-10-CM | POA: Insufficient documentation

## 2020-07-18 DIAGNOSIS — Z880 Allergy status to penicillin: Secondary | ICD-10-CM | POA: Insufficient documentation

## 2020-07-18 DIAGNOSIS — Z7901 Long term (current) use of anticoagulants: Secondary | ICD-10-CM | POA: Diagnosis not present

## 2020-07-18 DIAGNOSIS — I10 Essential (primary) hypertension: Secondary | ICD-10-CM

## 2020-07-18 DIAGNOSIS — Z885 Allergy status to narcotic agent status: Secondary | ICD-10-CM | POA: Insufficient documentation

## 2020-07-18 DIAGNOSIS — I252 Old myocardial infarction: Secondary | ICD-10-CM | POA: Diagnosis not present

## 2020-07-18 DIAGNOSIS — Z7902 Long term (current) use of antithrombotics/antiplatelets: Secondary | ICD-10-CM | POA: Diagnosis not present

## 2020-07-18 DIAGNOSIS — G4730 Sleep apnea, unspecified: Secondary | ICD-10-CM | POA: Diagnosis not present

## 2020-07-18 DIAGNOSIS — Z955 Presence of coronary angioplasty implant and graft: Secondary | ICD-10-CM | POA: Insufficient documentation

## 2020-07-18 DIAGNOSIS — X58XXXA Exposure to other specified factors, initial encounter: Secondary | ICD-10-CM | POA: Diagnosis not present

## 2020-07-18 DIAGNOSIS — F3341 Major depressive disorder, recurrent, in partial remission: Secondary | ICD-10-CM

## 2020-07-18 DIAGNOSIS — Z888 Allergy status to other drugs, medicaments and biological substances status: Secondary | ICD-10-CM | POA: Insufficient documentation

## 2020-07-18 DIAGNOSIS — K219 Gastro-esophageal reflux disease without esophagitis: Secondary | ICD-10-CM

## 2020-07-18 HISTORY — PX: OPEN REDUCTION INTERNAL FIXATION (ORIF) DISTAL RADIAL FRACTURE: SHX5989

## 2020-07-18 SURGERY — OPEN REDUCTION INTERNAL FIXATION (ORIF) DISTAL RADIUS FRACTURE
Anesthesia: General | Laterality: Right

## 2020-07-18 MED ORDER — ONDANSETRON HCL 4 MG/2ML IJ SOLN: 4.0000 mg | Freq: Once | INTRAMUSCULAR | Status: DC | PRN

## 2020-07-18 MED ORDER — FENTANYL CITRATE (PF) 100 MCG/2ML IJ SOLN
INTRAMUSCULAR | Status: AC
  Administered 2020-07-18: 25 ug via INTRAVENOUS
  Filled 2020-07-18: qty 2

## 2020-07-18 MED ORDER — NEOMYCIN-POLYMYXIN B GU 40-200000 IR SOLN
Status: AC
  Filled 2020-07-18: qty 2

## 2020-07-18 MED ORDER — PHENYLEPHRINE HCL (PRESSORS) 10 MG/ML IV SOLN
INTRAVENOUS | Status: DC | PRN
  Administered 2020-07-18 (×3): 100 ug via INTRAVENOUS
  Administered 2020-07-18: 200 ug via INTRAVENOUS

## 2020-07-18 MED ORDER — FLUOXETINE HCL 20 MG PO CAPS
20.0000 mg | ORAL_CAPSULE | Freq: Every day | ORAL | Status: AC
Start: 2020-07-18 — End: ?

## 2020-07-18 MED ORDER — DEXAMETHASONE SODIUM PHOSPHATE 10 MG/ML IJ SOLN
INTRAMUSCULAR | Status: AC
  Filled 2020-07-18: qty 1

## 2020-07-18 MED ORDER — FENTANYL CITRATE (PF) 100 MCG/2ML IJ SOLN
25.0000 ug | INTRAMUSCULAR | Status: AC | PRN
  Administered 2020-07-18 (×4): 25 ug via INTRAVENOUS

## 2020-07-18 MED ORDER — ACETAMINOPHEN 10 MG/ML IV SOLN
INTRAVENOUS | Status: AC
  Filled 2020-07-18: qty 100

## 2020-07-18 MED ORDER — OXYCODONE HCL 5 MG PO TABS: 5.0000 mg | ORAL_TABLET | Freq: Once | ORAL | Status: AC | PRN

## 2020-07-18 MED ORDER — CHLORHEXIDINE GLUCONATE 0.12 % MT SOLN: 15.0000 mL | Freq: Once | OROMUCOSAL | Status: AC

## 2020-07-18 MED ORDER — CHLORHEXIDINE GLUCONATE 0.12 % MT SOLN
OROMUCOSAL | Status: AC
  Administered 2020-07-18: 15 mL via OROMUCOSAL
  Filled 2020-07-18: qty 15

## 2020-07-18 MED ORDER — LIDOCAINE HCL (CARDIAC) PF 100 MG/5ML IV SOSY
PREFILLED_SYRINGE | INTRAVENOUS | Status: DC | PRN
  Administered 2020-07-18: 80 mg via INTRAVENOUS

## 2020-07-18 MED ORDER — FENTANYL CITRATE (PF) 100 MCG/2ML IJ SOLN
INTRAMUSCULAR | Status: DC | PRN
  Administered 2020-07-18: 50 ug via INTRAVENOUS
  Administered 2020-07-18 (×2): 25 ug via INTRAVENOUS

## 2020-07-18 MED ORDER — PHENYLEPHRINE HCL-NACL 10-0.9 MG/250ML-% IV SOLN
INTRAVENOUS | Status: DC | PRN
  Administered 2020-07-18: 35 ug/min via INTRAVENOUS

## 2020-07-18 MED ORDER — DEXAMETHASONE SODIUM PHOSPHATE 10 MG/ML IJ SOLN
INTRAMUSCULAR | Status: DC | PRN
  Administered 2020-07-18: 8 mg via INTRAVENOUS

## 2020-07-18 MED ORDER — CEFAZOLIN SODIUM-DEXTROSE 2-4 GM/100ML-% IV SOLN
2.0000 g | INTRAVENOUS | Status: AC
  Administered 2020-07-18: 2 g via INTRAVENOUS

## 2020-07-18 MED ORDER — PROPOFOL 10 MG/ML IV BOLUS
INTRAVENOUS | Status: DC | PRN
  Administered 2020-07-18: 120 mg via INTRAVENOUS

## 2020-07-18 MED ORDER — OXYCODONE HCL 5 MG PO TABS
ORAL_TABLET | ORAL | Status: AC
  Filled 2020-07-18: qty 1

## 2020-07-18 MED ORDER — LACTATED RINGERS IV SOLN: INTRAVENOUS | Status: DC

## 2020-07-18 MED ORDER — OMEPRAZOLE 40 MG PO CPDR: 40.0000 mg | DELAYED_RELEASE_CAPSULE | Freq: Every day | ORAL | Status: DC

## 2020-07-18 MED ORDER — PROPOFOL 10 MG/ML IV BOLUS
INTRAVENOUS | Status: AC
  Filled 2020-07-18: qty 20

## 2020-07-18 MED ORDER — METOPROLOL TARTRATE 25 MG PO TABS: 50.0000 mg | ORAL_TABLET | Freq: Two times a day (BID) | ORAL | Status: DC

## 2020-07-18 MED ORDER — OXYCODONE HCL 5 MG PO TABS
ORAL_TABLET | ORAL | Status: AC
  Administered 2020-07-18: 5 mg via ORAL
  Filled 2020-07-18: qty 1

## 2020-07-18 MED ORDER — OXYCODONE HCL 5 MG PO TABS
5.0000 mg | ORAL_TABLET | Freq: Once | ORAL | Status: AC
  Administered 2020-07-18: 5 mg via ORAL

## 2020-07-18 MED ORDER — ORAL CARE MOUTH RINSE: 15.0000 mL | Freq: Once | OROMUCOSAL | Status: AC

## 2020-07-18 MED ORDER — OXYCODONE HCL 5 MG PO TABS: 5.0000 mg | ORAL_TABLET | Freq: Three times a day (TID) | ORAL | 0 refills | Status: DC | PRN

## 2020-07-18 MED ORDER — OXYCODONE HCL 5 MG/5ML PO SOLN: 5.0000 mg | Freq: Once | ORAL | Status: AC | PRN

## 2020-07-18 MED ORDER — CEFAZOLIN SODIUM-DEXTROSE 2-4 GM/100ML-% IV SOLN
INTRAVENOUS | Status: AC
  Filled 2020-07-18: qty 100

## 2020-07-18 MED ORDER — ONDANSETRON HCL 4 MG/2ML IJ SOLN
INTRAMUSCULAR | Status: DC | PRN
  Administered 2020-07-18: 4 mg via INTRAVENOUS

## 2020-07-18 MED ORDER — FENTANYL CITRATE (PF) 100 MCG/2ML IJ SOLN
INTRAMUSCULAR | Status: AC
  Filled 2020-07-18: qty 2

## 2020-07-18 MED ORDER — ACETAMINOPHEN 10 MG/ML IV SOLN
INTRAVENOUS | Status: DC | PRN
  Administered 2020-07-18: 1000 mg via INTRAVENOUS

## 2020-07-18 SURGICAL SUPPLY — 44 items
APL PRP STRL LF DISP 70% ISPRP (MISCELLANEOUS) ×1
BIT DRILL 2 FAST STEP (BIT) ×1 IMPLANT
BIT DRILL 2.5X4 QC (BIT) ×1 IMPLANT
BNDG ELASTIC 4X5.8 VLCR STR LF (GAUZE/BANDAGES/DRESSINGS) ×2 IMPLANT
CANISTER SUCT 1200ML W/VALVE (MISCELLANEOUS) ×2 IMPLANT
CHLORAPREP W/TINT 26 (MISCELLANEOUS) ×2 IMPLANT
CUFF TOURN SGL QUICK 18X4 (TOURNIQUET CUFF) IMPLANT
DRAPE FLUOR MINI C-ARM 54X84 (DRAPES) ×2 IMPLANT
ELECT REM PT RETURN 9FT ADLT (ELECTROSURGICAL) ×2
ELECTRODE REM PT RTRN 9FT ADLT (ELECTROSURGICAL) ×1 IMPLANT
GAUZE 4X4 16PLY ~~LOC~~+RFID DBL (SPONGE) ×3 IMPLANT
GAUZE SPONGE 4X4 12PLY STRL (GAUZE/BANDAGES/DRESSINGS) ×2 IMPLANT
GAUZE XEROFORM 1X8 LF (GAUZE/BANDAGES/DRESSINGS) ×4 IMPLANT
GLOVE SURG SYN 9.0  PF PI (GLOVE) ×1
GLOVE SURG SYN 9.0 PF PI (GLOVE) ×1 IMPLANT
GOWN SRG 2XL LVL 4 RGLN SLV (GOWNS) ×1 IMPLANT
GOWN STRL NON-REIN 2XL LVL4 (GOWNS) ×2
GOWN STRL REUS W/ TWL LRG LVL3 (GOWN DISPOSABLE) ×1 IMPLANT
GOWN STRL REUS W/TWL LRG LVL3 (GOWN DISPOSABLE) ×2
K-WIRE 1.6 (WIRE) ×2
K-WIRE FX5X1.6XNS BN SS (WIRE) ×1
KIT TURNOVER KIT A (KITS) ×2 IMPLANT
KWIRE FX5X1.6XNS BN SS (WIRE) IMPLANT
MANIFOLD NEPTUNE II (INSTRUMENTS) ×2 IMPLANT
NDL FILTER BLUNT 18X1 1/2 (NEEDLE) ×1 IMPLANT
NEEDLE FILTER BLUNT 18X 1/2SAF (NEEDLE) ×1
NEEDLE FILTER BLUNT 18X1 1/2 (NEEDLE) ×1 IMPLANT
NS IRRIG 500ML POUR BTL (IV SOLUTION) ×2 IMPLANT
PACK EXTREMITY ARMC (MISCELLANEOUS) ×2 IMPLANT
PAD CAST CTTN 4X4 STRL (SOFTGOODS) ×2 IMPLANT
PADDING CAST COTTON 4X4 STRL (SOFTGOODS) ×4
PEG SUBCHONDRAL SMOOTH 2.0X14 (Peg) ×1 IMPLANT
PEG SUBCHONDRAL SMOOTH 2.0X16 (Peg) ×1 IMPLANT
PEG SUBCHONDRAL SMOOTH 2.0X18 (Peg) ×2 IMPLANT
PEG SUBCHONDRAL SMOOTH 2.0X20 (Peg) ×1 IMPLANT
PEG SUBCHONDRAL SMOOTH 2.0X22 (Peg) ×1 IMPLANT
PLATE SHORT 21.6X48.9 NRRW RT (Plate) ×1 IMPLANT
SCALPEL PROTECTED #15 DISP (BLADE) ×4 IMPLANT
SPLINT CAST 1 STEP 3X12 (MISCELLANEOUS) ×2 IMPLANT
SUT ETHILON 4-0 (SUTURE) ×2
SUT ETHILON 4-0 FS2 18XMFL BLK (SUTURE) ×1
SUT VICRYL 3-0 27IN (SUTURE) ×2 IMPLANT
SUTURE ETHLN 4-0 FS2 18XMF BLK (SUTURE) ×1 IMPLANT
SYR 3ML LL SCALE MARK (SYRINGE) ×2 IMPLANT

## 2020-07-18 NOTE — Transfer of Care (Signed)
Immediate Anesthesia Transfer of Care Note  Patient: Kayla Maxwell  Procedure(s) Performed: OPEN REDUCTION INTERNAL FIXATION (ORIF) DISTAL RADIAL FRACTURE (Right)  Patient Location: PACU  Anesthesia Type:General  Level of Consciousness: sedated  Airway & Oxygen Therapy: Patient Spontanous Breathing and Patient connected to face mask oxygen  Post-op Assessment: Report given to RN and Post -op Vital signs reviewed and stable  Post vital signs: Reviewed and stable  Last Vitals:  Vitals Value Taken Time  BP 105/50 07/18/20 1311  Temp    Pulse 72   Resp 16 07/18/20 1313  SpO2 100   Vitals shown include unvalidated device data.  Last Pain:  Vitals:   07/18/20 1106  TempSrc: Temporal  PainSc: 7          Complications: No notable events documented.

## 2020-07-18 NOTE — Anesthesia Procedure Notes (Signed)
Procedure Name: LMA Insertion Date/Time: 07/18/2020 12:19 PM Performed by: Demetrius Charity, CRNA Pre-anesthesia Checklist: Patient identified, Patient being monitored, Timeout performed, Emergency Drugs available and Suction available Patient Re-evaluated:Patient Re-evaluated prior to induction Oxygen Delivery Method: Circle system utilized Preoxygenation: Pre-oxygenation with 100% oxygen Induction Type: IV induction Ventilation: Mask ventilation without difficulty LMA: LMA inserted LMA Size: 3.5 Tube type: Oral Number of attempts: 1 Placement Confirmation: positive ETCO2 and breath sounds checked- equal and bilateral Tube secured with: Tape Dental Injury: Teeth and Oropharynx as per pre-operative assessment

## 2020-07-18 NOTE — Anesthesia Preprocedure Evaluation (Addendum)
Anesthesia Evaluation  Patient identified by MRN, date of birth, ID band Patient awake    Reviewed: Allergy & Precautions, NPO status , Patient's Chart, lab work & pertinent test results  History of Anesthesia Complications (+) DIFFICULT AIRWAY and history of anesthetic complications  Airway Mallampati: II  TM Distance: >3 FB Neck ROM: Full    Dental  (+) Poor Dentition, Missing   Pulmonary sleep apnea , neg COPD, former smoker,    breath sounds clear to auscultation- rhonchi (-) wheezing      Cardiovascular hypertension, Pt. on medications + CAD, + Past MI, + Cardiac Stents, + Peripheral Vascular Disease and +CHF  + dysrhythmias Atrial Fibrillation  Rhythm:Irregular Rate:Normal - Systolic murmurs and - Diastolic murmurs Echo 7/61/60: MODERATE LV SYSTOLIC DYSFUNCTION NORMAL RIGHT VENTRICULAR SYSTOLIC FUNCTION  NO VALVULAR STENOSIS  MODERATE to SEVERE TR  MODERATE MR  TRIVIAL PR  EF 30%   L heart cath 02/13/20: Mid LAD lesion is 99% stenosed. Post intervention, there is a 0% residual stenosis. A stent was successfully placed. Minimal disease in circumflex Minimal disease in RCA Successful PCI and stent of mid LAD      Neuro/Psych  Headaches, PSYCHIATRIC DISORDERS Anxiety TIACVA, No Residual Symptoms    GI/Hepatic Neg liver ROS, GERD  ,  Endo/Other  neg diabetes  Renal/GU negative Renal ROS     Musculoskeletal  (+) Arthritis ,   Abdominal (+) - obese,   Peds  Hematology  (+) anemia ,   Anesthesia Other Findings Past Medical History: No date: Acid reflux     Comment:  takes Nexium daily No date: Allergy     Comment:  takes Singulair daily as needed No date: Anemia No date: Atrial fibrillation ([place]) AGE 3: Cancer ([place])     Comment:  CERVICAL cancer with removal No date: CHF (congestive heart failure) (HCC) No date: Chronic back pain     Comment:  stenosis No date: Complication of anesthesia No  date: Constipation     Comment:  takes Colace daily as needed No date: Coronary artery disease No date: Difficult intubation No date: Dysrhythmia     Comment:  atrial fibrillation dx 04/2014 No date: Headache     Comment:  sinus  No date: History of blood transfusion No date: History of shingles No date: Hyperlipidemia     Comment:  takes Pravastatin daily-started taking this 6 months ago No date: Hypertension No date: Lumbar surgical wound fluid collection No date: Sleep apnea     Comment:  CPAP No date: Stroke [facility])     Comment:  TIA'S X 2 --- one last yr and one this yr...lost her               memory over 4-6 hrs. 06-04-15: Stroke ([place])     Comment:  left with right side weakness No date: TIA (transient ischemic attack)     Comment:  showed up on an MRI but she never knew it No date: Urinary frequency   Reproductive/Obstetrics                            Anesthesia Physical Anesthesia Plan  ASA: 3  Anesthesia Plan: General   Post-op Pain Management:    Induction: Intravenous  PONV Risk Score and Plan: 2 and Ondansetron and Dexamethasone  Airway Management Planned: LMA  Additional Equipment:   Intra-op Plan:   Post-operative Plan:   Informed Consent: I have reviewed the patients History  and Physical, chart, labs and discussed the procedure including the risks, benefits and alternatives for the proposed anesthesia with the patient or authorized representative who has indicated his/her understanding and acceptance.     Dental advisory given  Plan Discussed with: CRNA and Anesthesiologist  Anesthesia Plan Comments:         Anesthesia Quick Evaluation

## 2020-07-18 NOTE — H&P (Signed)
Chief Complaint  Patient presents with   Right Wrist - Pain    History of the Present Illness: Kayla Maxwell is a 75 y.o. female here today.   The patient presents for evaluation of an extra-articular right distal radius fracture, treated in the ER with closed reduction; however, has inadequate alignment. She comes in to discuss ORIF.   The patient states she was supposed to get out of [facility]. She was there initially from intestinal issues. She is accompanied by a female companion who states the patient started vomiting on Tuesday, and that Friday she called her companion and stated her chair was spinning. The companion called 911, and when they arrived they could not find a pulse. This occurred 21 days ago today. EMS took her in to the hospital and she was given 6.5 bags of fluid. She was so dehydrated from vomiting, and she was not eating or drinking.   She does have a history of myocardial infarction and is on Xarelto, currently at [employer]. The patient takes Plavix in the morning. She had a heart catheterization 5 months ago, performed by Corey Skains, MD.  I have reviewed past medical, surgical, social and family history, and allergies as documented in the EMR.  Past Medical History: Past Medical History:  Diagnosis Date   Acid reflux   Anemia   Asthma without status asthmaticus, unspecified   Atrial fibrillation (CMS-HCC)   Cervical cancer (CMS-HCC)   Chickenpox   Coronary artery disease   CVA (cerebral vascular accident) (CMS-HCC)   DDD (degenerative disc disease), lumbar   Hyperlipidemia   MI (mitral incompetence)   Peripheral vascular disease (CMS-HCC)   PTSD (post-traumatic stress disorder)   Salmonella 08/2016   Seasonal allergies   Shingles  twice   Sleep apnea   Spinal stenosis of lumbar region   TI (tricuspid incompetence)   TIA (transient ischemic attack)   Uterine cancer (CMS-HCC) 1978?   Past Surgical History: Past Surgical History:   Procedure Laterality Date   APPENDECTOMY   back surgery 07/04/2014   CESAREAN SECTION   CHOLECYSTECTOMY   COLONOSCOPY   EGD   HYSTERECTOMY   LAMINECTOMY LUMBAR SPINE   REMOVAL OVARIAN CYST Right   REPAIR VENTRAL HERNIA LAPAROSCOPIC   TONSILLECTOMY   Wisdom Teeth Removed   Past Family History: Family History  Problem Relation Age of Onset   Alzheimer's disease Mother   Myocardial Infarction (Heart attack) Father   Pancreatic cancer Sister   Medications: Current Outpatient Medications Ordered in Epic  Medication Sig Dispense Refill   albuterol 90 mcg/actuation inhaler Inhale 2 inhalations into the lungs   clopidogreL (PLAVIX) 75 mg tablet Take 1 tablet (75 mg total) by mouth once daily 90 tablet 4   famotidine (PEPCID) 20 MG tablet Take 1 tablet by mouth 2 (two) times daily   FLUoxetine (PROZAC) 20 MG capsule Take 20 mg by mouth once daily   FUROsemide (LASIX) 20 MG tablet Take 1 tablet by mouth once daily as needed   gabapentin (NEURONTIN) 300 MG capsule TAKE 1 CAPSULE BY MOUTH EVERYDAY AT BEDTIME   HYDROcodone-acetaminophen (NORCO) 5-325 mg tablet TAKE 1 TO 2 TABLETS BY MOUTH EVERY 8 HOURS AS NEEDED FOR MODERATE OR SEVERE PAIN   lisinopril (PRINIVIL,ZESTRIL) 2.5 MG tablet Take 2.5 mg by mouth once daily. 0   metoprolol tartrate (LOPRESSOR) 25 MG tablet Take 2 tablets (50 mg total) by mouth 2 (two) times daily 60 tablet 11   nitroGLYcerin (NITROSTAT) 0.4 MG SL  tablet PLACE 1 TABLET UNDER THE TONGUE EVERY 5 MINUTES X 3 DOSES AS NEEDED FOR CHEST PAIN.   omeprazole (PRILOSEC) 40 MG DR capsule Take 40 mg by mouth once daily   ondansetron (ZOFRAN-ODT) 4 MG disintegrating tablet Take 1 tablet by mouth every 8 (eight) hours as needed   rosuvastatin (CRESTOR) 20 MG tablet Take 20 mg by mouth once daily   sennosides (SENOKOT) 8.6 mg tablet Take 1 tablet by mouth once daily   XARELTO 20 mg tablet TAKE ONE TABLET BY MOUTH DAILY WITH DINNER 90 tablet 1   No current Epic-ordered  facility-administered medications on file.   Allergies: Allergies  Allergen Reactions   Adhesive Hives   Codeine Other (See Comments)  Makes her feel "crazy"   Latex Other (See Comments)   Penicillins Swelling   Sulfa (Sulfonamide Antibiotics) Rash    Body mass index is 26.59 kg/m.  Review of Systems: A comprehensive 14 point ROS was performed, reviewed, and the pertinent orthopaedic findings are documented in the HPI.  Vitals:  07/16/20 1052  BP: 110/66    General Physical Examination:   General/Constitutional: No apparent distress: well-nourished and well developed. Eyes: Pupils equal, round with synchronous movement. Lungs: Clear to auscultation HEENT: Normal Vascular: No edema, swelling or tenderness, except as noted in detailed exam. Cardiac: Heart rate and rhythm is regular. Integumentary: No impressive skin lesions present, except as noted in detailed exam. Neuro/Psych: Normal mood and affect, oriented to person, place and time.  Musculoskeletal Examination:  On exam, numbness and tingling to the right fingers. Right wrist edema.  Radiographs:  No new imaging studies were obtained today.  Assessment: ICD-10-CM  1. Closed Colles' fracture of right radius, initial encounter S52.531A   Plan:  The patient has clinical findings of left extra-articular distal radius fracture.  We discussed the patient's prior x-ray findings and treatment options. I explained her fracture has shifted. I recommend ORIF of the left distal radius under anesthesia later this week after we receive cardiac clearance. I explained the surgery and postoperative course in detail.  Surgical Risks:  The nature of the condition and the proposed procedure has been reviewed in detail with the patient. Surgical versus non-surgical options and prognosis for recovery have been reviewed and the inherent risks and benefits of each have been discussed including the risks of infection, bleeding,  injury to nerves/blood vessels/tendons, incomplete relief of symptoms, persisting pain and/or stiffness, loss of function, complex regional pain syndrome, failure of the procedure, as appropriate.  Teeth: 2  Attestation: I, Dawn Royse, am documenting for [facility], MD utilizing [place].   Answers for HPI/ROS submitted by the patient on 07/16/2020 How did your symptoms begin?: suddenly with injury How long ago did your symptoms begin?: 6 Days Where is your pain located? (select all that apply): left wrist Please select what best describes your pain (choose all that apply): sharp, stabbing Please choose the severity that best describes your pain: severe Rate the pain on a scale of 0 (no pain) to 10 (worst pain ever): 9/10 How often does the pain occur?: constant Since the onset of your problem, has the pain improved, worsened, or stayed the same?: about the same as when symptoms started Do your symptoms wake you from sleep?: Yes Please choose all the items that worsen your symptoms: activity with limb Please choose all the items that improve your symptoms: narcotics, splint Please indicate any studies you've had for this problem (select all that apply): X-rays, CT  scan When was the date of your injury?: 07/10/2020 Is this a work-related injury?: No Have you filed a worker's comp claim?: No   Electronically signed by Lauris Poag, MD at 07/16/2020 6:51 PM EDT  Reviewed  H+P. No changes noted.

## 2020-07-18 NOTE — Anesthesia Postprocedure Evaluation (Signed)
Anesthesia Post Note  Patient: Kayla Maxwell  Procedure(s) Performed: OPEN REDUCTION INTERNAL FIXATION (ORIF) DISTAL RADIAL FRACTURE (Right)  Patient location during evaluation: PACU Anesthesia Type: General Level of consciousness: awake and alert and oriented Pain management: pain level controlled Vital Signs Assessment: post-procedure vital signs reviewed and stable Respiratory status: spontaneous breathing, nonlabored ventilation and respiratory function stable Cardiovascular status: blood pressure returned to baseline and stable Postop Assessment: no signs of nausea or vomiting Anesthetic complications: no   No notable events documented.   Last Vitals:  Vitals:   07/18/20 1430 07/18/20 1443  BP: (!) 139/91 135/83  Pulse: 84 81  Resp: 16 16  Temp: (!) 36.2 C 36.9 C  SpO2: 92% 93%    Last Pain:  Vitals:   07/18/20 1443  TempSrc:   PainSc: 6                  Luqman Perrelli

## 2020-07-18 NOTE — Progress Notes (Signed)
Patients daughter called to ask about patient being on blood thinners for surgery. Spoke with Terri Piedra, CMA at Dr Alveria Apley office, cleared to stop xarleto for 3 days and plavix for 4 days. I secure chatted Dr Rudene Christians and relayed the clearance days from Dr Nehemiah Massed. Per Dr Rudene Christians ok to have surgery while [place] on medications. Relayed to daughter this information.

## 2020-07-18 NOTE — Op Note (Signed)
07/18/2020  1:15 PM  PATIENT:  Kayla Maxwell  75 y.o. female  PRE-OPERATIVE DIAGNOSIS:  Closed Colles' fracture of right radius, initial encounter S52.531A  POST-OPERATIVE DIAGNOSIS:  Closed Colles' fracture of right radius, initial encounter S52.531A  PROCEDURE:  Procedure(s): OPEN REDUCTION INTERNAL FIXATION (ORIF) DISTAL RADIAL FRACTURE (Right)  SURGEON: Laurene Footman, MD  ASSISTANTS: None  ANESTHESIA:   general  EBL:  Total I/O In: -  Out: 20 [Blood:20]  BLOOD ADMINISTERED:none  DRAINS: none   LOCAL MEDICATIONS USED:  NONE  SPECIMEN:  No Specimen  DISPOSITION OF SPECIMEN:  N/A  COUNTS:  YES  TOURNIQUET:   Total Tourniquet Time Documented: Upper Arm (Right) - 29 minutes Total: Upper Arm (Right) - 29 minutes   IMPLANTS: Hand innovations short narrow right plate with multiple smooth pegs and screws  DICTATION: .Dragon Dictation patient was brought to the operating room and after adequate general anesthesia was obtained the right arm was prepped and draped in usual sterile fashion.  After patient identification and timeout procedure were completed fingertrap traction was applied to the index and middle fingers with 10 pounds of traction to restore length with tourniquet raised at the start of the case.  Incision was made over the FCR tendon with hemostasis achieved electrocautery.  Tendon sheath incised retracted radially to protect the radial artery and veins with the pronator elevated off the radial border of the shaft and distal fragments.  With a Soil scientist the dorsal displacement could be reset and volar plate was applied and pinned into position.  1 is appropriately placed all smooth peg holes were filled drilling measuring and placing smooth pegs following this the plate was brought down to the shaft and 310 mm screws inserted.  With finger traps removed permanent C arm views were obtained and they showed essentially anatomic alignment of the fracture with 2  distal intra-articular fragments well aligned.  The wound was thoroughly irrigated and then wound closed with 3-0 Vicryl subcutaneously followed by 4-0 nylon for the skin.  Xeroform 4 x 4 web roll volar splint and Ace wrap applied tourniquet let down to close the case.  PLAN OF CARE: Discharge to home after PACU  PATIENT DISPOSITION:  PACU - hemodynamically stable.

## 2020-07-18 NOTE — Discharge Instructions (Addendum)
Keep arm elevated is much as possible all weekend.  Ice to the back of the wrist may help with pain and swelling. Work on finger motion is much as you can tolerate trying to make a fist. Pain medicine as directed. Call office if you are having problems. Do not worry if there is some bloody drainage with your blood thinner that is expected. AMBULATORY SURGERY  DISCHARGE INSTRUCTIONS   The drugs that you were given will stay in your system until tomorrow so for the next 24 hours you should not:  Drive an automobile Make any legal decisions Drink any alcoholic beverage   You may resume regular meals tomorrow.  Today it is better to start with liquids and gradually work up to solid foods.  You may eat anything you prefer, but it is better to start with liquids, then soup and crackers, and gradually work up to solid foods.   Please notify your doctor immediately if you have any unusual bleeding, trouble breathing, redness and pain at the surgery site, drainage, fever, or pain not relieved by medication.    Additional Instructions:        Please contact your physician with any problems or Same Day Surgery at [phone], Monday through Friday 6 am to 4 pm, or [place] at [facility] number at [phone].

## 2020-07-21 ENCOUNTER — Ambulatory Visit: Payer: Medicare Other | Admitting: Family Medicine

## 2020-07-21 ENCOUNTER — Encounter: Payer: Self-pay | Admitting: Orthopedic Surgery

## 2020-07-21 DIAGNOSIS — K529 Noninfective gastroenteritis and colitis, unspecified: Secondary | ICD-10-CM | POA: Diagnosis not present

## 2020-07-21 DIAGNOSIS — I4891 Unspecified atrial fibrillation: Secondary | ICD-10-CM | POA: Diagnosis not present

## 2020-07-21 DIAGNOSIS — S52511A Displaced fracture of right radial styloid process, initial encounter for closed fracture: Secondary | ICD-10-CM | POA: Diagnosis not present

## 2020-07-21 DIAGNOSIS — Z8781 Personal history of (healed) traumatic fracture: Secondary | ICD-10-CM | POA: Diagnosis not present

## 2020-07-21 DIAGNOSIS — Z9889 Other specified postprocedural states: Secondary | ICD-10-CM | POA: Diagnosis not present

## 2020-07-21 DIAGNOSIS — S52531A Colles' fracture of right radius, initial encounter for closed fracture: Secondary | ICD-10-CM | POA: Diagnosis not present

## 2020-07-22 ENCOUNTER — Ambulatory Visit: Payer: Medicare Other | Admitting: Family Medicine

## 2020-07-28 ENCOUNTER — Telehealth: Payer: Self-pay | Admitting: *Deleted

## 2020-07-28 NOTE — Chronic Care Management (AMB) (Signed)
  Chronic Care Management   Outreach Note  07/28/2020 Name: Genessy Mindel Dejoseph MRN: [id number] DOB: 1945-08-20  Ambermarie Seery Mayabb is a 75 y.o. year old female who is a primary care patient of [facility]. I reached out to [place] by phone today in response to a referral sent by Ms. [place]ink[place] PCP Mar Daring, PA-C     An unsuccessful telephone outreach was attempted today. The patient was referred to the case management team for assistance with care management and care coordination.   Follow Up Plan: A HIPAA compliant phone message was left for the patient providing contact information and requesting a return call.  If patient returns call to provider office, please advise to call Embedded Care Management Care Guide Jazper Nikolai at [place], [place] [facility]  Direct Dial: [phone]

## 2020-07-29 ENCOUNTER — Ambulatory Visit: Payer: Medicare Other | Admitting: Family Medicine

## 2020-07-29 DIAGNOSIS — S52511A Displaced fracture of right radial styloid process, initial encounter for closed fracture: Secondary | ICD-10-CM | POA: Diagnosis not present

## 2020-07-29 DIAGNOSIS — I70229 Atherosclerosis of native arteries of extremities with rest pain, unspecified extremity: Secondary | ICD-10-CM | POA: Diagnosis not present

## 2020-07-29 DIAGNOSIS — R2681 Unsteadiness on feet: Secondary | ICD-10-CM | POA: Diagnosis not present

## 2020-07-29 DIAGNOSIS — I4891 Unspecified atrial fibrillation: Secondary | ICD-10-CM | POA: Diagnosis not present

## 2020-08-01 DIAGNOSIS — F339 Major depressive disorder, recurrent, unspecified: Secondary | ICD-10-CM | POA: Diagnosis not present

## 2020-08-01 DIAGNOSIS — K529 Noninfective gastroenteritis and colitis, unspecified: Secondary | ICD-10-CM | POA: Diagnosis not present

## 2020-08-01 DIAGNOSIS — R2689 Other abnormalities of gait and mobility: Secondary | ICD-10-CM | POA: Diagnosis not present

## 2020-08-01 DIAGNOSIS — S52511A Displaced fracture of right radial styloid process, initial encounter for closed fracture: Secondary | ICD-10-CM | POA: Diagnosis not present

## 2020-08-01 DIAGNOSIS — I2510 Atherosclerotic heart disease of native coronary artery without angina pectoris: Secondary | ICD-10-CM | POA: Diagnosis not present

## 2020-08-01 DIAGNOSIS — M6281 Muscle weakness (generalized): Secondary | ICD-10-CM | POA: Diagnosis not present

## 2020-08-01 DIAGNOSIS — Z8781 Personal history of (healed) traumatic fracture: Secondary | ICD-10-CM | POA: Diagnosis not present

## 2020-08-01 DIAGNOSIS — I4891 Unspecified atrial fibrillation: Secondary | ICD-10-CM | POA: Diagnosis not present

## 2020-08-01 DIAGNOSIS — Z9889 Other specified postprocedural states: Secondary | ICD-10-CM | POA: Diagnosis not present

## 2020-08-04 DIAGNOSIS — I110 Hypertensive heart disease with heart failure: Secondary | ICD-10-CM | POA: Diagnosis not present

## 2020-08-04 DIAGNOSIS — E1142 Type 2 diabetes mellitus with diabetic polyneuropathy: Secondary | ICD-10-CM | POA: Diagnosis not present

## 2020-08-04 DIAGNOSIS — I4891 Unspecified atrial fibrillation: Secondary | ICD-10-CM | POA: Diagnosis not present

## 2020-08-04 DIAGNOSIS — K529 Noninfective gastroenteritis and colitis, unspecified: Secondary | ICD-10-CM | POA: Diagnosis not present

## 2020-08-04 DIAGNOSIS — I509 Heart failure, unspecified: Secondary | ICD-10-CM | POA: Diagnosis not present

## 2020-08-04 DIAGNOSIS — M6281 Muscle weakness (generalized): Secondary | ICD-10-CM | POA: Diagnosis not present

## 2020-08-05 ENCOUNTER — Telehealth: Payer: Self-pay | Admitting: Physician Assistant

## 2020-08-05 NOTE — Chronic Care Management (AMB) (Signed)
  Chronic Care Management   Note  08/05/2020 Name: Kayla Maxwell MRN: [id number] DOB: March 07, 1945  [name] is a 75 y.o. year old female who is a primary care patient of [facility]. I reached out to [place] by phone today in response to a referral sent by Ms. Gwen Her Rivkin's PCP Mar Daring, PA-C     Ms. England was given information about Chronic Care Management services today including:  CCM service includes personalized support from designated clinical staff supervised by her physician, including individualized plan of care and coordination with other care providers 24/7 contact phone numbers for assistance for urgent and routine care needs. Service will only be billed when office clinical staff spend 20 minutes or more in a month to coordinate care. Only one practitioner may furnish and bill the service in a calendar month. The patient may stop CCM services at any time (effective at the end of the month) by phone call to the office staff. The patient will be responsible for cost sharing (co-pay) of up to 20% of the service fee (after annual deductible is met).  Patient agreed to services and verbal consent obtained.   Follow up plan: Telephone appointment with care management team member scheduled for: 08/26/2020  Julian Hy, [place] [facility]  Direct Dial: [phone]

## 2020-08-05 NOTE — Chronic Care Management (AMB) (Signed)
  Chronic Care Management   Outreach Note  08/05/2020 Name: Angelika Dermer Hylton MRN: [id number] DOB: 1945-03-26  Tamari Shillinglaw Rathman is a 75 y.o. year old female who is a primary care patient of [facility]. I reached out to [place] by phone today in response to a referral sent by Ms. [place] PCP Mar Daring, PA-C     A second unsuccessful telephone outreach was attempted today. The patient was referred to the case management team for assistance with care management and care coordination.   Follow Up Plan: A HIPAA compliant phone message was left for the patient providing contact information and requesting a return call.  If patient returns call to provider office, please advise to call Embedded Care Management Care Guide Redina Zeller at [place], [place] [facility]  Direct Dial: [phone]

## 2020-08-05 NOTE — Telephone Encounter (Signed)
Pt's daughter called reporting that the patient needs a letter from her PCP to resume her home health services  [place] at [facility]  Fax: [place] at [facility] on [address] in [place]  Phone: Lonell Face [phone] She had services 3 x a week from 11am-3pm MWF.   Best contact: [phone]

## 2020-08-07 NOTE — Telephone Encounter (Signed)
Ok to send order or letter - whatever they need

## 2020-08-08 NOTE — Telephone Encounter (Signed)
Pts daughter called and stated [place] needs a letter stating that the pt needs home health care assistance / so she can get back on the home health plan she was on before for 3 days a week from 11am -3pm MWF/ Please fax the letter to Merrilee Seashore at [facility] (fax#) [phone]  The agency that the orders will be for is [place] / [place] / if needed contact [place] with [facility] at [phone] please advise when letter is ready

## 2020-08-09 DIAGNOSIS — K529 Noninfective gastroenteritis and colitis, unspecified: Secondary | ICD-10-CM | POA: Diagnosis not present

## 2020-08-09 DIAGNOSIS — M6281 Muscle weakness (generalized): Secondary | ICD-10-CM | POA: Diagnosis not present

## 2020-08-09 DIAGNOSIS — I110 Hypertensive heart disease with heart failure: Secondary | ICD-10-CM | POA: Diagnosis not present

## 2020-08-09 DIAGNOSIS — I509 Heart failure, unspecified: Secondary | ICD-10-CM | POA: Diagnosis not present

## 2020-08-09 DIAGNOSIS — E1142 Type 2 diabetes mellitus with diabetic polyneuropathy: Secondary | ICD-10-CM | POA: Diagnosis not present

## 2020-08-09 DIAGNOSIS — I4891 Unspecified atrial fibrillation: Secondary | ICD-10-CM | POA: Diagnosis not present

## 2020-08-13 DIAGNOSIS — E1142 Type 2 diabetes mellitus with diabetic polyneuropathy: Secondary | ICD-10-CM | POA: Diagnosis not present

## 2020-08-13 DIAGNOSIS — I110 Hypertensive heart disease with heart failure: Secondary | ICD-10-CM | POA: Diagnosis not present

## 2020-08-13 DIAGNOSIS — I4891 Unspecified atrial fibrillation: Secondary | ICD-10-CM | POA: Diagnosis not present

## 2020-08-13 DIAGNOSIS — M6281 Muscle weakness (generalized): Secondary | ICD-10-CM | POA: Diagnosis not present

## 2020-08-13 DIAGNOSIS — K529 Noninfective gastroenteritis and colitis, unspecified: Secondary | ICD-10-CM | POA: Diagnosis not present

## 2020-08-13 DIAGNOSIS — I509 Heart failure, unspecified: Secondary | ICD-10-CM | POA: Diagnosis not present

## 2020-08-13 NOTE — Telephone Encounter (Signed)
Patients daughter states that patient has insurance through [employer], she states that when patient had stroke in February of this year policy became in effect at end of February. When insurance policy came in to effect daughter states that [facility] started coming out to patients home for home health services. [facility] was sent by [employer] insurance during that period of time for care. Patient daughter states services were to come out to the home 3 days out the week M,W,F and were providing meals, housekeep and getting patient in and out of shower. Patients daughter states for 3 months after stroke patient was in life vest and could not take care of herself. Patients daughter states that on 06/20/20 patient was admitted to hospital and was discharged on 06/23/20, she states that upon discharge [employer] ended services  because she was cleared in hospital. Patients daughter states that patient does need theses services [place] for 3 days a week to bathe, feed and tend to house keeping. She is asking for doctor to approve continuation of home health services through [facility], she states that the reason she is requesting we fax letter to [facility] is because they originally wrote the affidavit she states for [employer].  In letter I was going to fax to [facility] I was going to mention to continue care through [facility] for home health services that would include fixing meals, house keeping and bathing as previously ordered in February 2022. KW

## 2020-08-14 ENCOUNTER — Other Ambulatory Visit: Payer: Self-pay

## 2020-08-14 ENCOUNTER — Telehealth (INDEPENDENT_AMBULATORY_CARE_PROVIDER_SITE_OTHER): Payer: Medicare Other | Admitting: Family Medicine

## 2020-08-14 DIAGNOSIS — I252 Old myocardial infarction: Secondary | ICD-10-CM | POA: Diagnosis not present

## 2020-08-14 DIAGNOSIS — Z8673 Personal history of transient ischemic attack (TIA), and cerebral infarction without residual deficits: Secondary | ICD-10-CM

## 2020-08-14 DIAGNOSIS — Z789 Other specified health status: Secondary | ICD-10-CM

## 2020-08-14 NOTE — Telephone Encounter (Signed)
Called and spoke with daughter and advised her of message below, she states that patient needs appt ASAP since she requested letter on Monday. I offered 3PM time slot for today, she states that she will be at work and will not be present with patient at time of appt, she states that she will talk with her employer to see if she may be able to leave early. I advised we can schedule this as a mychart video visit and she states that patient does not understand how to use video and cannot get into her mychart. I offered to contact patient to walk her through how to go into mychart and resetting password, daughter declined virtual visit and states that it would be easier for visit to be telephone. I have scheduled at telephone visit at [place]

## 2020-08-14 NOTE — Telephone Encounter (Signed)
Insurance will require face-to-face (can be virtual) to document all of this in an encounter and evaluate her in order to accept this. Can see if anyone has availability

## 2020-08-14 NOTE — Progress Notes (Signed)
Virtual telephone visit        Virtual Visit via Telephone Note     This visit type was conducted due to national recommendations for  restrictions regarding the COVID-19 Pandemic (e.g. social distancing) in  an effort to limit this patient's exposure and mitigate transmission in  our community. Due to her co-morbid illnesses, this patient is at least at  moderate risk for complications without adequate follow up. This format is  felt to be most appropriate for this patient at this time. The patient did  not have access to video technology or had technical difficulties with  video requiring transitioning to audio format only (telephone). Physical  exam was limited to content and character of the telephone converstion.        Patient location: home  Provider location: [place] involved in the visit: patient, provider      I discussed the limitations of evaluation and management by telemedicine  and the availability of in person appointments. The patient expressed  understanding and agreed to proceed.     Visit Date: 08/14/2020    Today's healthcare provider: Lavon Paganini, MD     Chief Complaint   Patient presents with    Follow-up     Subjective      HPI     Had STEMI in 2/22 and survived. Had [facility] care after this 3 days per week  paid for by long term care insurance. This was discontinued after her  hospitalization in 6/22.    Had flu-like illness in 6/22 and admitted for IV hydration and supportive  care. Was discharged to [facility] and fell and had closed distal  radius fracture of R side s/p ORIF in 7/22    Walks with a cane, fall risk. Unable to use walker due to wrist injury.   Daughter is currently assisting with ADLs and IADLs.  Expressive aphasia  after stroke several years ago.    Also suffers from chronic peripheral neuropathy that contributes to fall  risk.           Medications:  Outpatient Medications Prior to Visit    Medication Sig    albuterol (PROVENTIL HFA;VENTOLIN HFA) 108 (90 Base) MCG/ACT inhaler  Inhale 2 puffs into the lungs every 6 (six) hours as needed for wheezing  or shortness of breath.    clopidogrel (PLAVIX) 75 MG tablet Take 75 mg by mouth daily.    famotidine (PEPCID) 20 MG tablet Take 1 tablet (20 mg total) by mouth 2  (two) times daily.    FLUoxetine (PROZAC) 20 MG capsule Take 1 capsule (20 mg total) by mouth  daily.    furosemide (LASIX) 20 MG tablet Take 1 tablet (20 mg total) by mouth  daily as needed for fluid or edema.    gabapentin (NEURONTIN) 300 MG capsule Take 1 capsule (300 mg total) by  mouth 2 (two) times daily. (Patient taking differently: Take 300 mg by  mouth daily.)    HYDROcodone-acetaminophen (NORCO) 5-325 MG tablet Take 1-2 tablets by  mouth every 8 (eight) hours as needed for moderate pain or severe pain.    lisinopril (ZESTRIL) 2.5 MG tablet TAKE 1 TABLET BY MOUTH EVERY DAY    metoprolol tartrate (LOPRESSOR) 25 MG tablet Take 2 tablets (50 mg total)  by mouth 2 (two) times daily.    nitroGLYCERIN (NITROSTAT) 0.4 MG SL tablet Place 1 tablet (0.4 mg total)  under the tongue every 5 (five)  minutes x 3 doses as needed for chest  pain.    omeprazole (PRILOSEC) 40 MG capsule Take 1 capsule (40 mg total) by mouth  daily.    ondansetron (ZOFRAN ODT) 4 MG disintegrating tablet Take 1 tablet (4 mg  total) by mouth every 8 (eight) hours as needed for nausea or vomiting.    rivaroxaban (XARELTO) 20 MG TABS tablet Take 20 mg by mouth daily with  supper.     rosuvastatin (CRESTOR) 20 MG tablet Take 1 tablet (20 mg total) by mouth  daily.    senna (SENOKOT) 8.6 MG TABS tablet Take 1 tablet by mouth daily. (Patient  not taking: Reported on 08/14/2020)    [DISCONTINUED] oxyCODONE (ROXICODONE) 5 MG immediate release tablet Take  1 tablet (5 mg total) by mouth every 8 (eight) hours as needed. (Patient  not taking: Reported on 08/14/2020)     No facility-administered medications prior to  visit.       Review of Systems - per HPI       Objective      There were no vitals taken for this visit.         Assessment & Plan        Problem List Items Addressed This Visit          Other    Decreased activities of daily living (ADL) - Primary    History of MI (myocardial infarction)     Other Visit Diagnoses       History of CVA (cerebrovascular accident)             - h/o ORIF, MI, CVA, expressive aphasia, inability to perform ADLs  independently, and high fall risk.  - needs to resume [facility] services that she was previously receiving.  - will complete letter ot the company stating this.    Return in about 4 weeks (around 09/11/2020) for chronic disease f/u, With  new PCP.       I discussed the assessment and treatment plan with the patient. The  patient was provided an opportunity to ask questions and all were  answered. The patient agreed with the plan and demonstrated an  understanding of the instructions.     The patient was advised to call back or seek an in-person evaluation if  the symptoms worsen or if the condition fails to improve as anticipated.    I provided 22 minutes of non-face-to-face time during this encounter.    I, Lavon Paganini, MD, have reviewed all documentation for this visit.  The documentation on 08/14/20 for the exam, diagnosis, procedures, and  orders are all accurate and complete.      Wadell Craddock, Dionne Bucy, MD, MPH  [facility]

## 2020-08-14 NOTE — Telephone Encounter (Signed)
Noted  

## 2020-08-18 ENCOUNTER — Other Ambulatory Visit: Payer: Self-pay

## 2020-08-18 ENCOUNTER — Ambulatory Visit (INDEPENDENT_AMBULATORY_CARE_PROVIDER_SITE_OTHER): Payer: Medicare Other | Admitting: Family Medicine

## 2020-08-18 ENCOUNTER — Encounter: Payer: Self-pay | Admitting: Family Medicine

## 2020-08-18 ENCOUNTER — Other Ambulatory Visit: Payer: Self-pay | Admitting: Family Medicine

## 2020-08-18 ENCOUNTER — Inpatient Hospital Stay: Payer: Medicare Other | Admitting: Family Medicine

## 2020-08-18 VITALS — BP 106/84 | HR 104 | Temp 98.9°F | Resp 15 | Wt 156.9 lb

## 2020-08-18 DIAGNOSIS — I872 Venous insufficiency (chronic) (peripheral): Secondary | ICD-10-CM | POA: Diagnosis not present

## 2020-08-18 DIAGNOSIS — J4540 Moderate persistent asthma, uncomplicated: Secondary | ICD-10-CM

## 2020-08-18 DIAGNOSIS — Z8781 Personal history of (healed) traumatic fracture: Secondary | ICD-10-CM | POA: Diagnosis not present

## 2020-08-18 DIAGNOSIS — I739 Peripheral vascular disease, unspecified: Secondary | ICD-10-CM

## 2020-08-18 DIAGNOSIS — Z0289 Encounter for other administrative examinations: Secondary | ICD-10-CM | POA: Insufficient documentation

## 2020-08-18 DIAGNOSIS — G5793 Unspecified mononeuropathy of bilateral lower limbs: Secondary | ICD-10-CM

## 2020-08-18 DIAGNOSIS — Z9889 Other specified postprocedural states: Secondary | ICD-10-CM | POA: Diagnosis not present

## 2020-08-18 MED ORDER — HYDROCODONE-ACETAMINOPHEN 5-325 MG PO TABS: 1.0000 | ORAL_TABLET | Freq: Two times a day (BID) | ORAL | 0 refills | Status: DC | PRN

## 2020-08-18 MED ORDER — GABAPENTIN 300 MG PO CAPS: 600.0000 mg | ORAL_CAPSULE | Freq: Every day | ORAL | 1 refills | Status: AC

## 2020-08-18 NOTE — Assessment & Plan Note (Signed)
Previous stenting Recommended return for PVLs and possible need for additional stents Daughter and pt refused at this time Amb ref to vasc sx at later date

## 2020-08-18 NOTE — Assessment & Plan Note (Signed)
Chronic condition Controlled with use of Plavix and Gabapentin Change in gabapentin dose/time d/t drowsiness Using hydrocodone for BLE pain- education provided

## 2020-08-18 NOTE — Progress Notes (Signed)
Established patient visit   Patient: Kayla Maxwell   DOB: 26-Aug-1945   75 y.o. Female  MRN: [id number] Visit Date: 08/18/2020  Today's healthcare provider: Gwyneth Sprout, FNP   Chief Complaint  Patient presents with   Transitions Of Care    Patient returns  to clinic today accompanied by her daughter for follow up visit after being released from [facility] on 08/04/20   Subjective    HPI HPI     Transitions Of Care    Additional comments: Patient returns  to clinic today accompanied by her daughter for follow up visit after being released from [facility] on 08/04/20      Last edited by Minette Headland, CMA on 08/18/2020 10:56 AM.          Medications: Outpatient Medications Prior to Visit  Medication Sig   albuterol (PROVENTIL HFA;VENTOLIN HFA) 108 (90 Base) MCG/ACT inhaler Inhale 2 puffs into the lungs every 6 (six) hours as needed for wheezing or shortness of breath.   clopidogrel (PLAVIX) 75 MG tablet Take 75 mg by mouth daily.   famotidine (PEPCID) 20 MG tablet Take 1 tablet (20 mg total) by mouth 2 (two) times daily.   FLUoxetine (PROZAC) 20 MG capsule Take 1 capsule (20 mg total) by mouth daily.   furosemide (LASIX) 20 MG tablet Take 1 tablet (20 mg total) by mouth daily as needed for fluid or edema.   lisinopril (ZESTRIL) 2.5 MG tablet TAKE 1 TABLET BY MOUTH EVERY DAY   metoprolol tartrate (LOPRESSOR) 25 MG tablet Take 2 tablets (50 mg total) by mouth 2 (two) times daily.   nitroGLYCERIN (NITROSTAT) 0.4 MG SL tablet Place 1 tablet (0.4 mg total) under the tongue every 5 (five) minutes x 3 doses as needed for chest pain.   omeprazole (PRILOSEC) 40 MG capsule Take 1 capsule (40 mg total) by mouth daily.   ondansetron (ZOFRAN ODT) 4 MG disintegrating tablet Take 1 tablet (4 mg total) by mouth every 8 (eight) hours as needed for nausea or vomiting.   rivaroxaban (XARELTO) 20 MG TABS tablet Take 20 mg by mouth daily with supper.    rosuvastatin (CRESTOR) 20  MG tablet Take 1 tablet (20 mg total) by mouth daily.   [DISCONTINUED] gabapentin (NEURONTIN) 300 MG capsule Take 1 capsule (300 mg total) by mouth 2 (two) times daily. (Patient taking differently: Take 300 mg by mouth daily.)   [DISCONTINUED] HYDROcodone-acetaminophen (NORCO) 5-325 MG tablet Take 1-2 tablets by mouth every 8 (eight) hours as needed for moderate pain or severe pain.   [DISCONTINUED] senna (SENOKOT) 8.6 MG TABS tablet Take 1 tablet by mouth daily. (Patient not taking: Reported on 08/14/2020)   No facility-administered medications prior to visit.    Review of Systems     Objective    BP 106/84   Pulse (!) 104   Temp 98.9 F (37.2 C) (Oral)   Resp 15   Wt 156 lb 14.4 oz (71.2 kg)   SpO2 92%   BMI 26.11 kg/m     Physical Exam Vitals and nursing note reviewed. Exam conducted with a chaperone present.  Constitutional:      General: She is awake. She is not in acute distress.    Appearance: Normal appearance. She is well-developed, well-groomed and overweight. She is not ill-appearing, toxic-appearing or diaphoretic.  HENT:     Head: Normocephalic and atraumatic.  Cardiovascular:     Rate and Rhythm: Normal rate and regular rhythm.  Pulses:          Popliteal pulses are 2+ on the right side and 2+ on the left side.       Dorsalis pedis pulses are 1+ on the right side and 1+ on the left side.       Posterior tibial pulses are 1+ on the right side and 1+ on the left side.     Heart sounds: Normal heart sounds, S1 normal and S2 normal. No murmur heard.   No friction rub. No gallop.     Comments: Mottling in toes bilateral R>L Pulmonary:     Effort: Pulmonary effort is normal.     Breath sounds: Normal breath sounds and air entry.  Abdominal:     Palpations: Abdomen is soft.     Tenderness: There is abdominal tenderness. There is no guarding or rebound.     Hernia: No hernia is present.     Comments: LLQ- chronic per pt report  Musculoskeletal:         General: Tenderness present. No swelling, deformity or signs of injury.     Right lower leg: No edema.     Left lower leg: No edema.  Feet:     Right foot:     Toenail Condition: Right toenails are abnormally thick.     Left foot:     Toenail Condition: Left toenails are abnormally thick.     Comments: Skin mottled purple/pink Cool to touch >3 sec cap refill; R>L Sensation decreased; however, grossly intake on examination Skin:    General: Skin is cool and dry.     Capillary Refill: Capillary refill takes more than 3 seconds.     Coloration: Skin is mottled.       Neurological:     Mental Status: She is alert and oriented to person, place, and time.     Sensory: Sensory deficit present.     Motor: Weakness present.     Comments: Ambulation with cane  Psychiatric:        Attention and Perception: Attention normal.        Mood and Affect: Mood and affect normal.        Speech: Speech normal.        Behavior: Behavior normal. Behavior is cooperative.        Thought Content: Thought content normal.        Cognition and Memory: Cognition normal.        Judgment: Judgment normal.      No results found for any visits on 08/18/20.  Assessment & Plan     Problem List Items Addressed This Visit       Cardiovascular and Mediastinum   PAD (peripheral artery disease) (HCC)    Chronic condition Controlled with use of Plavix and Gabapentin Change in gabapentin dose/time d/t drowsiness Using hydrocodone for BLE pain- education provided      Venous insufficiency of both lower extremities    Previous stenting Recommended return for PVLs and possible need for additional stents Daughter and pt refused at this time Amb ref to vasc sx at later date        Nervous and Auditory   Neuropathy of both feet - Primary    Ongoing concern r/t PAD and VI Pt educated on importance of foot checks Using shoes with wide toe box Elevation when not walking Use of TED hose Change in gaba;  decreased dose of norco      Relevant Medications   HYDROcodone-acetaminophen (  NORCO) 5-325 MG tablet (Start on 09/04/2020)   gabapentin (NEURONTIN) 300 MG capsule     Other   Encounter for completion of form with patient    Completion of DMV form for Handicap Card- previously had       Return in about 4 weeks (around 09/15/2020), or if symptoms worsen or fail to improve.      Vonna Kotyk, FNP, have reviewed all documentation for this visit. The documentation on 08/18/20 for the exam, diagnosis, procedures, and orders are all accurate and complete.    Gwyneth Sprout, [place] [phone] (phone) [phone] (fax)  [place]

## 2020-08-18 NOTE — Assessment & Plan Note (Signed)
Completion of DMV form for Handicap Card- previously had

## 2020-08-18 NOTE — Assessment & Plan Note (Signed)
Ongoing concern r/t PAD and VI Pt educated on importance of foot checks Using shoes with wide toe box Elevation when not walking Use of TED hose Change in gaba; decreased dose of norco

## 2020-08-18 NOTE — Telephone Encounter (Signed)
Pt's daughter called to report that she forgot to request a rescue inhaler during her appt today.  Albuterol Sulfate 90 MG   [id number] [place], [place] - [address] Phone: [phone]: [phone]

## 2020-08-19 MED ORDER — ALBUTEROL SULFATE HFA 108 (90 BASE) MCG/ACT IN AERS: 2.0000 | INHALATION_SPRAY | Freq: Four times a day (QID) | RESPIRATORY_TRACT | 5 refills | Status: AC | PRN

## 2020-08-20 DIAGNOSIS — E1142 Type 2 diabetes mellitus with diabetic polyneuropathy: Secondary | ICD-10-CM | POA: Diagnosis not present

## 2020-08-20 DIAGNOSIS — I4891 Unspecified atrial fibrillation: Secondary | ICD-10-CM | POA: Diagnosis not present

## 2020-08-20 DIAGNOSIS — M6281 Muscle weakness (generalized): Secondary | ICD-10-CM | POA: Diagnosis not present

## 2020-08-20 DIAGNOSIS — I509 Heart failure, unspecified: Secondary | ICD-10-CM | POA: Diagnosis not present

## 2020-08-20 DIAGNOSIS — K529 Noninfective gastroenteritis and colitis, unspecified: Secondary | ICD-10-CM | POA: Diagnosis not present

## 2020-08-20 DIAGNOSIS — I110 Hypertensive heart disease with heart failure: Secondary | ICD-10-CM | POA: Diagnosis not present

## 2020-08-21 ENCOUNTER — Telehealth: Payer: Medicare Other | Admitting: Family Medicine

## 2020-08-21 DIAGNOSIS — I110 Hypertensive heart disease with heart failure: Secondary | ICD-10-CM | POA: Diagnosis not present

## 2020-08-21 DIAGNOSIS — K529 Noninfective gastroenteritis and colitis, unspecified: Secondary | ICD-10-CM | POA: Diagnosis not present

## 2020-08-21 DIAGNOSIS — M6281 Muscle weakness (generalized): Secondary | ICD-10-CM | POA: Diagnosis not present

## 2020-08-21 DIAGNOSIS — I509 Heart failure, unspecified: Secondary | ICD-10-CM | POA: Diagnosis not present

## 2020-08-21 DIAGNOSIS — E1142 Type 2 diabetes mellitus with diabetic polyneuropathy: Secondary | ICD-10-CM | POA: Diagnosis not present

## 2020-08-21 DIAGNOSIS — I4891 Unspecified atrial fibrillation: Secondary | ICD-10-CM | POA: Diagnosis not present

## 2020-08-26 ENCOUNTER — Telehealth: Payer: Self-pay

## 2020-08-26 ENCOUNTER — Telehealth: Payer: Medicare Other

## 2020-08-26 NOTE — Telephone Encounter (Signed)
  Care Management   Follow Up Note   08/26/2020 Name: Kayla Maxwell MRN: [id number] DOB: January 15, 1945   Primary Care Provider: Gwyneth Sprout, FNP Reason for referral : Chronic Care Management   An unsuccessful telephone outreach was attempted today. The patient was referred to the case management team for assistance with care management and care coordination.    Follow Up Plan:  A HIPAA compliant voice message was left today requesting a return call.   [facility] Care Management [facility] [phone]

## 2020-08-27 DIAGNOSIS — K529 Noninfective gastroenteritis and colitis, unspecified: Secondary | ICD-10-CM | POA: Diagnosis not present

## 2020-08-27 DIAGNOSIS — I509 Heart failure, unspecified: Secondary | ICD-10-CM | POA: Diagnosis not present

## 2020-08-27 DIAGNOSIS — I110 Hypertensive heart disease with heart failure: Secondary | ICD-10-CM | POA: Diagnosis not present

## 2020-08-27 DIAGNOSIS — I4891 Unspecified atrial fibrillation: Secondary | ICD-10-CM | POA: Diagnosis not present

## 2020-08-27 DIAGNOSIS — E1142 Type 2 diabetes mellitus with diabetic polyneuropathy: Secondary | ICD-10-CM | POA: Diagnosis not present

## 2020-08-27 DIAGNOSIS — M6281 Muscle weakness (generalized): Secondary | ICD-10-CM | POA: Diagnosis not present

## 2020-08-28 ENCOUNTER — Ambulatory Visit (INDEPENDENT_AMBULATORY_CARE_PROVIDER_SITE_OTHER): Payer: Medicare Other

## 2020-08-28 DIAGNOSIS — I4891 Unspecified atrial fibrillation: Secondary | ICD-10-CM | POA: Diagnosis not present

## 2020-08-28 DIAGNOSIS — K529 Noninfective gastroenteritis and colitis, unspecified: Secondary | ICD-10-CM | POA: Diagnosis not present

## 2020-08-28 DIAGNOSIS — I509 Heart failure, unspecified: Secondary | ICD-10-CM | POA: Diagnosis not present

## 2020-08-28 DIAGNOSIS — M6281 Muscle weakness (generalized): Secondary | ICD-10-CM | POA: Diagnosis not present

## 2020-08-28 DIAGNOSIS — E1142 Type 2 diabetes mellitus with diabetic polyneuropathy: Secondary | ICD-10-CM | POA: Diagnosis not present

## 2020-08-28 DIAGNOSIS — I5022 Chronic systolic (congestive) heart failure: Secondary | ICD-10-CM | POA: Diagnosis not present

## 2020-08-28 DIAGNOSIS — I110 Hypertensive heart disease with heart failure: Secondary | ICD-10-CM | POA: Diagnosis not present

## 2020-08-28 NOTE — Chronic Care Management (AMB) (Signed)
  Chronic Care Management   CCM RN Visit Note  08/28/2020 Name: Leamber Fichter Helderman MRN: [id number] DOB: [birth year]  Subjective: Alexyia Mozo Rhew is a 75 y.o. year old female who is a primary care patient of Gwyneth Sprout, [place]. The care management team was consulted for assistance with disease management and care coordination needs.    Brief outreach with Mrs. Ruble's daughter/caregiver [name] regarding available service and outreach. [name] anticipates being available to complete telephonic outreach on 09/01/20. Agreed to call with urgent questions or concerns if needed prior to the rescheduled outreach.    PLAN A member of the care management team will follow up on 09/01/20.   [facility] Care Management [facility] [phone]

## 2020-08-29 ENCOUNTER — Telehealth: Payer: Self-pay | Admitting: *Deleted

## 2020-08-29 DIAGNOSIS — I5021 Acute systolic (congestive) heart failure: Secondary | ICD-10-CM | POA: Diagnosis not present

## 2020-08-29 DIAGNOSIS — I252 Old myocardial infarction: Secondary | ICD-10-CM | POA: Diagnosis not present

## 2020-08-29 DIAGNOSIS — I2510 Atherosclerotic heart disease of native coronary artery without angina pectoris: Secondary | ICD-10-CM | POA: Diagnosis not present

## 2020-08-29 DIAGNOSIS — I70219 Atherosclerosis of native arteries of extremities with intermittent claudication, unspecified extremity: Secondary | ICD-10-CM | POA: Diagnosis not present

## 2020-08-29 DIAGNOSIS — I872 Venous insufficiency (chronic) (peripheral): Secondary | ICD-10-CM | POA: Diagnosis not present

## 2020-08-29 DIAGNOSIS — E119 Type 2 diabetes mellitus without complications: Secondary | ICD-10-CM | POA: Diagnosis not present

## 2020-08-29 DIAGNOSIS — I071 Rheumatic tricuspid insufficiency: Secondary | ICD-10-CM | POA: Diagnosis not present

## 2020-08-29 DIAGNOSIS — G4733 Obstructive sleep apnea (adult) (pediatric): Secondary | ICD-10-CM | POA: Diagnosis not present

## 2020-08-29 DIAGNOSIS — I482 Chronic atrial fibrillation, unspecified: Secondary | ICD-10-CM | POA: Diagnosis not present

## 2020-08-29 DIAGNOSIS — I10 Essential (primary) hypertension: Secondary | ICD-10-CM | POA: Diagnosis not present

## 2020-08-29 DIAGNOSIS — I340 Nonrheumatic mitral (valve) insufficiency: Secondary | ICD-10-CM | POA: Diagnosis not present

## 2020-08-29 DIAGNOSIS — I5022 Chronic systolic (congestive) heart failure: Secondary | ICD-10-CM | POA: Diagnosis not present

## 2020-08-29 NOTE — Telephone Encounter (Signed)
I called Pt's daughter to let her know that the pt would need to fill out another DPR to be able to take someone off.  I advised her that I would send this message to the nurse concerning the DNR.  We do have blank DNR forms at the front desk if need to fill out.  Please advise pt's daughter, [name], at [phone].

## 2020-08-29 NOTE — Telephone Encounter (Signed)
Copied from [place] [phone]. Topic: General - Other >> Aug 29, 2020 12:40 PM Kayla Maxwell wrote: Reason for CRM: Pt called stating that she is needing to have advice on how to get a DNR in her home. She also states that she is requesting to have Kayla Maxwell from [facility]. Please advise.

## 2020-08-29 NOTE — Telephone Encounter (Signed)
Patients daughter will be in Monday morning to pick up form

## 2020-09-01 ENCOUNTER — Inpatient Hospital Stay: Payer: Medicare Other | Admitting: Family Medicine

## 2020-09-01 ENCOUNTER — Ambulatory Visit: Payer: Medicare Other

## 2020-09-01 DIAGNOSIS — E782 Mixed hyperlipidemia: Secondary | ICD-10-CM

## 2020-09-01 DIAGNOSIS — I10 Essential (primary) hypertension: Secondary | ICD-10-CM

## 2020-09-01 DIAGNOSIS — I5022 Chronic systolic (congestive) heart failure: Secondary | ICD-10-CM

## 2020-09-01 DIAGNOSIS — Z9181 History of falling: Secondary | ICD-10-CM

## 2020-09-01 NOTE — Chronic Care Management (AMB) (Signed)
Chronic Care Management   CCM RN Visit Note  09/01/2020 Name: Kayla Maxwell MRN: [id number] DOB: Sep 03, 1945  Subjective: Kayla Maxwell is a 75 y.o. year old female who is a primary care patient of Gwyneth Sprout, [place]. The care management team was consulted for assistance with disease management and care coordination needs.    Engaged with patient's daughter/caregiver [name] by telephone for initial visit in response to provider referral for case management and care coordination services.   Consent to Services:  The patient's daughter/caregiver was given the following information about Chronic Care Management services: 1. CCM service includes personalized support from designated clinical staff supervised by the primary care provider, including individualized plan of care and coordination with other care providers 2. 24/7 contact phone numbers for assistance for urgent and routine care needs. 3. Service will only be billed when office clinical staff spend 20 minutes or more in a month to coordinate care. 4. Only one practitioner may furnish and bill the service in a calendar month. 5.The patient may stop CCM services at any time (effective at the end of the month) by phone call to the office staff. 6. The patient will be responsible for cost sharing (co-pay) of up to 20% of the service fee (after annual deductible is met). Patient's daughter/caregiver agreed to services and consent obtained.   Assessment: Review of patient past medical history, allergies, medications, health status, including review of consultants reports, laboratory and other test data, was performed as part of comprehensive evaluation and provision of chronic care management services.   SDOH (Social Determinants of Health) assessments and interventions performed:  SDOH Interventions    Flowsheet Row Most Recent Value  SDOH Interventions   Food Insecurity Interventions Intervention Not Indicated  Transportation  Interventions Intervention Not Indicated        CCM Care Plan  Allergies  Allergen Reactions   Codeine Other (See Comments)    Makes her feel "crazy"   Penicillins Swelling    Did it involve swelling of the face/tongue/throat, SOB, or low BP? Unknown Did it involve sudden or severe rash/hives, skin peeling, or any reaction on the inside of your mouth or nose? No Did you need to seek medical attention at a hospital or doctor's office? Unknown When did it last happen?      Childhood allergy If all above answers are "NO", may proceed with cephalosporin use.    Latex    Adhesive [Tape] Hives    (Latex)   Sulfa Antibiotics Nausea Only and Rash    Outpatient Encounter Medications as of 09/01/2020  Medication Sig   albuterol (VENTOLIN HFA) 108 (90 Base) MCG/ACT inhaler Inhale 2 puffs into the lungs every 6 (six) hours as needed for wheezing or shortness of breath.   clopidogrel (PLAVIX) 75 MG tablet Take 75 mg by mouth daily.   famotidine (PEPCID) 20 MG tablet Take 1 tablet (20 mg total) by mouth 2 (two) times daily.   FLUoxetine (PROZAC) 20 MG capsule Take 1 capsule (20 mg total) by mouth daily.   gabapentin (NEURONTIN) 300 MG capsule Take 2 capsules (600 mg total) by mouth at bedtime.   [START ON 09/04/2020] HYDROcodone-acetaminophen (NORCO) 5-325 MG tablet Take 1-2 tablets by mouth 2 (two) times daily as needed for moderate pain. Do not fill within 30 days of previous refill. Thank you.   lisinopril (ZESTRIL) 2.5 MG tablet TAKE 1 TABLET BY MOUTH EVERY DAY   metoprolol tartrate (LOPRESSOR) 25 MG tablet Take 2 tablets (50  mg total) by mouth 2 (two) times daily.   omeprazole (PRILOSEC) 40 MG capsule Take 1 capsule (40 mg total) by mouth daily.   ondansetron (ZOFRAN ODT) 4 MG disintegrating tablet Take 1 tablet (4 mg total) by mouth every 8 (eight) hours as needed for nausea or vomiting.   rivaroxaban (XARELTO) 20 MG TABS tablet Take 20 mg by mouth daily with supper.    rosuvastatin  (CRESTOR) 20 MG tablet Take 1 tablet (20 mg total) by mouth daily.   furosemide (LASIX) 20 MG tablet Take 1 tablet (20 mg total) by mouth daily as needed for fluid or edema. (Patient not taking: Reported on 09/01/2020)   nitroGLYCERIN (NITROSTAT) 0.4 MG SL tablet Place 1 tablet (0.4 mg total) under the tongue every 5 (five) minutes x 3 doses as needed for chest pain.   No facility-administered encounter medications on file as of 09/01/2020.    Patient Active Problem List   Diagnosis Date Noted   Neuropathy of both feet 08/18/2020   Encounter for completion of form with patient 08/18/2020   Decreased activities of daily living (ADL) 08/14/2020   History of MI (myocardial infarction) 08/14/2020   Chronic congestive heart failure ([place]) 07/16/2020   Generalized abdominal pain    Influenza-like illness    Type 2 diabetes mellitus without complication, without long-term current use of insulin (HCC)    Intractable nausea and vomiting 12/45/8099   Acute systolic CHF (congestive heart failure) ([place]) 03/20/2020   Myocardial infarction ([place]) 02/12/2020   Atheroembolism of foot, left ([place]) 01/11/2019   Venous insufficiency of both lower extremities 11/02/2017   Pain and swelling of lower leg 06/20/2017   Benign essential HTN 11/01/2016   SOBOE (shortness of breath on exertion) 11/01/2016   Gastroenteritis 08/16/2016   Bilateral carotid artery stenosis 03/12/2016   Fine tremor 03/12/2016   PAD (peripheral artery disease) ([place]) 01/14/2016   Cough 06/09/2015   Abnormal blood sugar 04/11/2015   Anxiety 11/19/2014   PTSD (post-traumatic stress disorder) 10/29/2014   Allergic rhinitis 09/05/2014   Mixed hyperlipidemia 09/05/2014   Mild memory disturbance 09/05/2014   H/O stroke without residual deficits 09/05/2014   Edema 09/05/2014   Spinal stenosis, lumbar 07/04/2014   MI (mitral incompetence) 05/08/2014   TI (tricuspid incompetence) 05/08/2014   Atrial fibrillation ([place]) 05/08/2014   History  of tobacco abuse 04/29/2014   Gastro-esophageal reflux disease without esophagitis 04/29/2014   Obstructive apnea 04/29/2014   Atrial fibrillation by electrocardiography ([place]) 04/29/2014   Neuritis or radiculitis due to rupture of lumbar intervertebral disc 09/21/2013   DDD (degenerative disc disease), lumbar 08/24/2013   Bursitis, trochanteric 06/18/2013   Nerve root inflammation 06/18/2013   Arthralgia of hip 06/04/2013   Calculus of gallbladder with other cholecystitis, without mention of obstruction 04/27/2012    Conditions to be addressed/monitored:CHF, HTN, HLD and Fall Risks  Patient Care Plan: Fall Risk (Adult)     Problem Identified: Fall Risk      Long-Range Goal: Absence of Fall and Fall-Related Injury   Start Date: 09/01/2020  Expected End Date: 12/30/2020  Priority: High  Note:   Current Barriers:  Risk for Falls related to Impaired Gait  Clinical Goal(s):  Over the next 120 days, patient will not experience falls or require hospitalizations for fall related injuries.  Interventions:  Collaboration with Gwyneth Sprout, FNP regarding development and update of comprehensive plan of care as evidenced by provider attestation and co-signature Inter-disciplinary care team collaboration (see longitudinal plan of care) Reviewed medications and  discussed potential side effects such as dizziness, lightheadedness and frequent urination. Provided information regarding safety and fall prevention. Reports currently using a cane with all ambulation. Denies recent falls. Advised to avoid prolonged activity to prevent accident falls d/t exertion. Per daughter/caregiver [name], patient is currently using a cane. Reports several safety devices are in the home to help prevent accidental falls. She is currently receiving [place] services once a week. Discussed ability to perform ADL's and tasks in the home. [name] is currently staying in the home to assist with  ADL's, specifically bathing. Reports she was previously receiving in-home assistance three days a week. They are currently waiting for an appeal to determine if services can be resumed. Agreed to keep the care management team updated if assistance is required.    Self-Care Deficits/Patient Goals:  Utilize assistive device appropriately with all ambulation Ensure pathways are clear and well lit Change positions slowly and use caution when ambulating Wear secure fitting, skid free footwear when ambulating Notify provider or care management team for questions and new concerns as needed    Follow Up Plan:  Will follow up in two months    Patient Care Plan: Heart Failure (Adult)     Problem Identified: Symptom Exacerbation (Heart Failure)      Long-Range Goal: Symptom Exacerbation Prevented or Minimized   Start Date: 09/01/2020  Expected End Date: 12/30/2020  Priority: High  Note:   Current Barriers:  Chronic Disease Management support and educational needs related to CHF.  Case Manager Clinical Goal(s):  Over the next 120 days, patient will not require hospitalization due to complications related to CHF.  Interventions:  Collaboration with Gwyneth Sprout, FNP regarding development and update of comprehensive plan of care as evidenced by provider attestation and co-signature Inter-disciplinary care team collaboration (see longitudinal plan of care) Discussed current plan for CHF management with patient's daughter/caregiver [name]. Advised to ensure patient takes medications as prescribed.  Provided information regarding recommended weight parameters. Encouraged to weigh patient daily and record readings. Encouraged to notify provider for weight gain greater than 3 lbs overnight or weight gain greater than 5 lbs within a week. Discussed s/sx of fluid overload and indications for notifying a provider. Advised to continue monitoring sodium intake and assess symptoms daily. Reviewed  worsening s/sx related to CHF exacerbation and indications for seeking immediate medical attention.   Patient Goals/Self-Care Activities:  Take all medications as prescribed Monitor weight and record readings Adhere to recommended cardiac prudent/heart healthy diet Notify provider for weight gain outside of established parameters Contact provider or care management team with questions and new concerns as needed    Follow Up Plan:  Will follow up in two months    Patient Care Plan: Hypertension and Hyperlipidemia     Problem Identified: Hypertension and Hyperlipidemia      Long-Range Goal: Hypertension and Hyperlipidemia Monitored   Start Date: 09/01/2020  Expected End Date: 12/30/2020  Priority: High  Note:   Objective:  Last practice recorded BP readings:  BP Readings from Last 3 Encounters:  08/18/20 106/84  07/18/20 135/83  07/11/20 117/63   Most recent eGFR/CrCl: No results found for: EGFR  No components found for: CRCL Lab Results  Component Value Date   CHOL 106 02/13/2020   HDL 36 (L) 02/13/2020   LDLCALC 53 02/13/2020   TRIG 87 02/13/2020   CHOLHDL 2.9 02/13/2020    Current Barriers:  Chronic Disease Management support and educational needs related to HTN and HLD.  Case Manager Clinical Goal(s):  Over the next 120 days, patient will demonstrate adherence to prescribed treatment plan as evidenced by taking all medications as prescribed, monitoring and recording blood pressure and adhering to a low sodium/DASH diet.  Interventions:  Collaboration with Gwyneth Sprout, FNP regarding development and update of comprehensive plan of care as evidenced by provider attestation and co-signature Inter-disciplinary care team collaboration (see longitudinal plan of care) Reviewed medications.  Daughter/caregiver [name] advised to ensure patient continues taking medications as prescribed. Advised to notify a provider if she is unable to tolerate the prescribed regimen.  Advised to notify the care management team with concerns regarding prescription cost. Provided information regarding established blood pressure parameters along with indications for notifying a provider. [name] reports patient's recent systolic readings have ranged from 116-120's. Reports diastolic readings have ranged in the 70's. Encouraged to continue monitoring and recording readings. Discussed compliance with recommended cardiac prudent diet. Encouraged to read nutrition labels, monitor sodium intake and avoid highly processed foods when possible. Reviewed s/sx of heart attack, stroke and worsening symptoms that require immediate medical attention.   Patient Goals/Self-Care Activities: Self-administer medications as prescribed Monitor and record blood pressure Adhere to recommended cardiac prudent/heart healthy diet Notify provider or care management team with questions and new concerns as needed   Follow Up Plan:  Will follow up in two months        PLAN A member of the care management team will follow up in two months.   Cristy Friedlander [facility] Care Management [facility] [phone]

## 2020-09-01 NOTE — Patient Instructions (Addendum)
Thank you for allowing the Chronic Care Management team to participate in your care.   Goals Addressed: Patient Care Plan: Fall Risk (Adult)     Problem Identified: Fall Risk      Long-Range Goal: Absence of Fall and Fall-Related Injury   Start Date: 09/01/2020  Expected End Date: 12/30/2020  Priority: High  Note:   Current Barriers:  Risk for Falls related to Impaired Gait  Clinical Goal(s):  Over the next 120 days, patient will not experience falls or require hospitalizations for fall related injuries.  Interventions:  Collaboration with Gwyneth Sprout, FNP regarding development and update of comprehensive plan of care as evidenced by provider attestation and co-signature Inter-disciplinary care team collaboration (see longitudinal plan of care) Reviewed medications and discussed potential side effects such as dizziness, lightheadedness and frequent urination. Provided information regarding safety and fall prevention. Reports currently using a cane with all ambulation. Denies recent falls. Advised to avoid prolonged activity to prevent accident falls d/t exertion. Per daughter/caregiver [name], patient is currently using a cane. Reports several safety devices are in the home to help prevent accidental falls. She is currently receiving [place] services once a week. Discussed ability to perform ADL's and tasks in the home. [name] is currently staying in the home to assist with ADL's, specifically bathing. Reports she was previously receiving in-home assistance three days a week. They are currently waiting for an appeal to determine if services can be resumed. Agreed to keep the care management team updated if assistance is required.    Self-Care Deficits/Patient Goals:  Utilize assistive device appropriately with all ambulation Ensure pathways are clear and well lit Change positions slowly and use caution when ambulating Wear secure fitting, skid free footwear  when ambulating Notify provider or care management team for questions and new concerns as needed    Follow Up Plan:  Will follow up in two months    Patient Care Plan: Heart Failure (Adult)     Problem Identified: Symptom Exacerbation (Heart Failure)      Long-Range Goal: Symptom Exacerbation Prevented or Minimized   Start Date: 09/01/2020  Expected End Date: 12/30/2020  Priority: High  Note:   Current Barriers:  Chronic Disease Management support and educational needs related to CHF.  Case Manager Clinical Goal(s):  Over the next 120 days, patient will not require hospitalization due to complications related to CHF.  Interventions:  Collaboration with Gwyneth Sprout, FNP regarding development and update of comprehensive plan of care as evidenced by provider attestation and co-signature Inter-disciplinary care team collaboration (see longitudinal plan of care) Discussed current plan for CHF management with patient's daughter/caregiver [name]. Advised to ensure patient takes medications as prescribed.  Provided information regarding recommended weight parameters. Encouraged to weigh patient daily and record readings. Encouraged to notify provider for weight gain greater than 3 lbs overnight or weight gain greater than 5 lbs within a week. Discussed s/sx of fluid overload and indications for notifying a provider. Advised to continue monitoring sodium intake and assess symptoms daily. Reviewed worsening s/sx related to CHF exacerbation and indications for seeking immediate medical attention.   Patient Goals/Self-Care Activities:  Take all medications as prescribed Monitor weight and record readings Adhere to recommended cardiac prudent/heart healthy diet Notify provider for weight gain outside of established parameters Contact provider or care management team with questions and new concerns as needed    Follow Up Plan:  Will follow up in two months    Patient Care Plan:  Hypertension and Hyperlipidemia     Problem Identified: Hypertension and Hyperlipidemia      Long-Range Goal: Hypertension and Hyperlipidemia Monitored   Start Date: 09/01/2020  Expected End Date: 12/30/2020  Priority: High  Note:   Objective:  Last practice recorded BP readings:  BP Readings from Last 3 Encounters:  08/18/20 106/84  07/18/20 135/83  07/11/20 117/63   Most recent eGFR/CrCl: No results found for: EGFR  No components found for: CRCL Lab Results  Component Value Date   CHOL 106 02/13/2020   HDL 36 (L) 02/13/2020   LDLCALC 53 02/13/2020   TRIG 87 02/13/2020   CHOLHDL 2.9 02/13/2020    Current Barriers:  Chronic Disease Management support and educational needs related to HTN and HLD.  Case Manager Clinical Goal(s):  Over the next 120 days, patient will demonstrate adherence to prescribed treatment plan as evidenced by taking all medications as prescribed, monitoring and recording blood pressure and adhering to a low sodium/DASH diet.  Interventions:  Collaboration with Gwyneth Sprout, FNP regarding development and update of comprehensive plan of care as evidenced by provider attestation and co-signature Inter-disciplinary care team collaboration (see longitudinal plan of care) Reviewed medications.  Daughter/caregiver [name] advised to ensure patient continues taking medications as prescribed. Advised to notify a provider if she is unable to tolerate the prescribed regimen. Advised to notify the care management team with concerns regarding prescription cost. Provided information regarding established blood pressure parameters along with indications for notifying a provider. [name] reports patient's recent systolic readings have ranged from 116-120's. Reports diastolic readings have ranged in the 70's. Encouraged to continue monitoring and recording readings. Discussed compliance with recommended cardiac prudent diet. Encouraged to read nutrition labels, monitor  sodium intake and avoid highly processed foods when possible. Reviewed s/sx of heart attack, stroke and worsening symptoms that require immediate medical attention.   Patient Goals/Self-Care Activities: Self-administer medications as prescribed Monitor and record blood pressure Adhere to recommended cardiac prudent/heart healthy diet Notify provider or care management team with questions and new concerns as needed   Follow Up Plan:  Will follow up in two months       Mrs. Bernales daughter/caregiver [place] verbalized understanding of the information discussed during the telephonic outreach. Declined need for mailed/printed instructions. A member of the care management team will follow up in two months.   Cristy Friedlander [facility] Care Management [facility] [phone]

## 2020-09-03 DIAGNOSIS — I5022 Chronic systolic (congestive) heart failure: Secondary | ICD-10-CM | POA: Diagnosis not present

## 2020-09-03 DIAGNOSIS — I2510 Atherosclerotic heart disease of native coronary artery without angina pectoris: Secondary | ICD-10-CM | POA: Diagnosis not present

## 2020-09-03 DIAGNOSIS — I110 Hypertensive heart disease with heart failure: Secondary | ICD-10-CM | POA: Diagnosis not present

## 2020-09-03 DIAGNOSIS — K529 Noninfective gastroenteritis and colitis, unspecified: Secondary | ICD-10-CM | POA: Diagnosis not present

## 2020-09-03 DIAGNOSIS — E1142 Type 2 diabetes mellitus with diabetic polyneuropathy: Secondary | ICD-10-CM | POA: Diagnosis not present

## 2020-09-03 DIAGNOSIS — I509 Heart failure, unspecified: Secondary | ICD-10-CM | POA: Diagnosis not present

## 2020-09-03 DIAGNOSIS — E782 Mixed hyperlipidemia: Secondary | ICD-10-CM | POA: Diagnosis not present

## 2020-09-03 DIAGNOSIS — M6281 Muscle weakness (generalized): Secondary | ICD-10-CM | POA: Diagnosis not present

## 2020-09-03 DIAGNOSIS — Z7902 Long term (current) use of antithrombotics/antiplatelets: Secondary | ICD-10-CM | POA: Diagnosis not present

## 2020-09-03 DIAGNOSIS — Z87891 Personal history of nicotine dependence: Secondary | ICD-10-CM | POA: Diagnosis not present

## 2020-09-03 DIAGNOSIS — I4891 Unspecified atrial fibrillation: Secondary | ICD-10-CM | POA: Diagnosis not present

## 2020-09-03 DIAGNOSIS — M549 Dorsalgia, unspecified: Secondary | ICD-10-CM | POA: Diagnosis not present

## 2020-09-03 DIAGNOSIS — F32A Depression, unspecified: Secondary | ICD-10-CM | POA: Diagnosis not present

## 2020-09-03 DIAGNOSIS — I10 Essential (primary) hypertension: Secondary | ICD-10-CM | POA: Diagnosis not present

## 2020-09-03 DIAGNOSIS — D649 Anemia, unspecified: Secondary | ICD-10-CM | POA: Diagnosis not present

## 2020-09-03 DIAGNOSIS — K219 Gastro-esophageal reflux disease without esophagitis: Secondary | ICD-10-CM | POA: Diagnosis not present

## 2020-09-03 DIAGNOSIS — G8929 Other chronic pain: Secondary | ICD-10-CM | POA: Diagnosis not present

## 2020-09-03 DIAGNOSIS — Z7901 Long term (current) use of anticoagulants: Secondary | ICD-10-CM | POA: Diagnosis not present

## 2020-09-03 DIAGNOSIS — Z8673 Personal history of transient ischemic attack (TIA), and cerebral infarction without residual deficits: Secondary | ICD-10-CM | POA: Diagnosis not present

## 2020-09-05 DIAGNOSIS — I110 Hypertensive heart disease with heart failure: Secondary | ICD-10-CM | POA: Diagnosis not present

## 2020-09-05 DIAGNOSIS — I509 Heart failure, unspecified: Secondary | ICD-10-CM | POA: Diagnosis not present

## 2020-09-05 DIAGNOSIS — K529 Noninfective gastroenteritis and colitis, unspecified: Secondary | ICD-10-CM | POA: Diagnosis not present

## 2020-09-05 DIAGNOSIS — I4891 Unspecified atrial fibrillation: Secondary | ICD-10-CM | POA: Diagnosis not present

## 2020-09-05 DIAGNOSIS — M6281 Muscle weakness (generalized): Secondary | ICD-10-CM | POA: Diagnosis not present

## 2020-09-05 DIAGNOSIS — E1142 Type 2 diabetes mellitus with diabetic polyneuropathy: Secondary | ICD-10-CM | POA: Diagnosis not present

## 2020-09-17 DIAGNOSIS — I110 Hypertensive heart disease with heart failure: Secondary | ICD-10-CM | POA: Diagnosis not present

## 2020-09-17 DIAGNOSIS — M6281 Muscle weakness (generalized): Secondary | ICD-10-CM | POA: Diagnosis not present

## 2020-09-17 DIAGNOSIS — K529 Noninfective gastroenteritis and colitis, unspecified: Secondary | ICD-10-CM | POA: Diagnosis not present

## 2020-09-17 DIAGNOSIS — I4891 Unspecified atrial fibrillation: Secondary | ICD-10-CM | POA: Diagnosis not present

## 2020-09-17 DIAGNOSIS — E1142 Type 2 diabetes mellitus with diabetic polyneuropathy: Secondary | ICD-10-CM | POA: Diagnosis not present

## 2020-09-17 DIAGNOSIS — I509 Heart failure, unspecified: Secondary | ICD-10-CM | POA: Diagnosis not present

## 2020-09-19 ENCOUNTER — Telehealth: Payer: Self-pay

## 2020-09-19 NOTE — Telephone Encounter (Signed)
Copied from [place] [id number]. Topic: General - Other >> Sep 19, 2020  8:47 AM Lennox Solders wrote: Reason for CRM: [name] with [facility] is calling checking on physician order for rollator for energy conservation and mobility . [name] will refax the form again

## 2020-09-23 ENCOUNTER — Telehealth: Payer: Self-pay

## 2020-09-23 NOTE — Telephone Encounter (Signed)
Copied from [place] [id number]. Topic: General - Other >> Sep 23, 2020  1:13 PM Jodie Echevaria wrote: [name] with [facility] called in to inform Dr Rosanna Randy that se is sending the order back to be signed by him it was signed by Tally Joe but cant be accepted since her name is not listed on the original order. Please be advised

## 2020-09-23 NOTE — Telephone Encounter (Signed)
FYI

## 2020-09-29 DIAGNOSIS — E1142 Type 2 diabetes mellitus with diabetic polyneuropathy: Secondary | ICD-10-CM | POA: Diagnosis not present

## 2020-09-29 DIAGNOSIS — M6281 Muscle weakness (generalized): Secondary | ICD-10-CM | POA: Diagnosis not present

## 2020-09-29 DIAGNOSIS — K529 Noninfective gastroenteritis and colitis, unspecified: Secondary | ICD-10-CM | POA: Diagnosis not present

## 2020-09-29 DIAGNOSIS — I4891 Unspecified atrial fibrillation: Secondary | ICD-10-CM | POA: Diagnosis not present

## 2020-09-29 DIAGNOSIS — I509 Heart failure, unspecified: Secondary | ICD-10-CM | POA: Diagnosis not present

## 2020-09-29 DIAGNOSIS — I110 Hypertensive heart disease with heart failure: Secondary | ICD-10-CM | POA: Diagnosis not present

## 2020-10-02 ENCOUNTER — Telehealth: Payer: Self-pay

## 2020-10-02 NOTE — Progress Notes (Signed)
Left message for patient to call back and schedule Medicare Annual Wellness Visit (AWV).    Please offer to do virtually or by telephone sometime this week    Last AWV:08/23/2019    Please schedule at anytime with [facility] Health Advisor.    If any questions, please contact me at [place], [place], [place], [place] [address] Direct Dial: [phone] [email or web address] Website: [email or web address]

## 2020-10-03 DIAGNOSIS — I509 Heart failure, unspecified: Secondary | ICD-10-CM | POA: Diagnosis not present

## 2020-10-03 DIAGNOSIS — K529 Noninfective gastroenteritis and colitis, unspecified: Secondary | ICD-10-CM | POA: Diagnosis not present

## 2020-10-03 DIAGNOSIS — E1142 Type 2 diabetes mellitus with diabetic polyneuropathy: Secondary | ICD-10-CM | POA: Diagnosis not present

## 2020-10-03 DIAGNOSIS — M549 Dorsalgia, unspecified: Secondary | ICD-10-CM | POA: Diagnosis not present

## 2020-10-03 DIAGNOSIS — Z8673 Personal history of transient ischemic attack (TIA), and cerebral infarction without residual deficits: Secondary | ICD-10-CM | POA: Diagnosis not present

## 2020-10-03 DIAGNOSIS — I110 Hypertensive heart disease with heart failure: Secondary | ICD-10-CM | POA: Diagnosis not present

## 2020-10-03 DIAGNOSIS — G8929 Other chronic pain: Secondary | ICD-10-CM | POA: Diagnosis not present

## 2020-10-03 DIAGNOSIS — D649 Anemia, unspecified: Secondary | ICD-10-CM | POA: Diagnosis not present

## 2020-10-03 DIAGNOSIS — M6281 Muscle weakness (generalized): Secondary | ICD-10-CM | POA: Diagnosis not present

## 2020-10-03 DIAGNOSIS — K219 Gastro-esophageal reflux disease without esophagitis: Secondary | ICD-10-CM | POA: Diagnosis not present

## 2020-10-03 DIAGNOSIS — I4891 Unspecified atrial fibrillation: Secondary | ICD-10-CM | POA: Diagnosis not present

## 2020-10-03 DIAGNOSIS — Z87891 Personal history of nicotine dependence: Secondary | ICD-10-CM | POA: Diagnosis not present

## 2020-10-03 DIAGNOSIS — I2510 Atherosclerotic heart disease of native coronary artery without angina pectoris: Secondary | ICD-10-CM | POA: Diagnosis not present

## 2020-10-03 DIAGNOSIS — Z7902 Long term (current) use of antithrombotics/antiplatelets: Secondary | ICD-10-CM | POA: Diagnosis not present

## 2020-10-03 DIAGNOSIS — Z7901 Long term (current) use of anticoagulants: Secondary | ICD-10-CM | POA: Diagnosis not present

## 2020-10-06 ENCOUNTER — Other Ambulatory Visit: Payer: Self-pay | Admitting: Family Medicine

## 2020-10-06 DIAGNOSIS — G5793 Unspecified mononeuropathy of bilateral lower limbs: Secondary | ICD-10-CM

## 2020-10-06 NOTE — Telephone Encounter (Unsigned)
Copied from [place] [id number]. Topic: Quick Communication - Rx Refill/Question >> Oct 06, 2020  1:42 PM Tessa Lerner A wrote: Medication: HYDROcodone-acetaminophen (NORCO) 5-325 MG tablet [[id number]]   Has the patient contacted their pharmacy? No. (Agent: If no, request that the patient contact the pharmacy for the refill.) (Agent: If yes, when and what did the pharmacy advise?)  Preferred Pharmacy (with phone number or street name): [id number] - [place], [address]  Phone:  [phone] Fax:  [phone]  Has the patient been seen for an appointment in the last year OR does the patient have an upcoming appointment? Yes.    Agent: Please be advised that RX refills may take up to 3 business days. We ask that you follow-up with your pharmacy.

## 2020-10-07 DIAGNOSIS — E1142 Type 2 diabetes mellitus with diabetic polyneuropathy: Secondary | ICD-10-CM | POA: Diagnosis not present

## 2020-10-07 DIAGNOSIS — M6281 Muscle weakness (generalized): Secondary | ICD-10-CM | POA: Diagnosis not present

## 2020-10-07 DIAGNOSIS — I110 Hypertensive heart disease with heart failure: Secondary | ICD-10-CM | POA: Diagnosis not present

## 2020-10-07 DIAGNOSIS — I509 Heart failure, unspecified: Secondary | ICD-10-CM | POA: Diagnosis not present

## 2020-10-07 DIAGNOSIS — K529 Noninfective gastroenteritis and colitis, unspecified: Secondary | ICD-10-CM | POA: Diagnosis not present

## 2020-10-07 DIAGNOSIS — I4891 Unspecified atrial fibrillation: Secondary | ICD-10-CM | POA: Diagnosis not present

## 2020-10-07 NOTE — Telephone Encounter (Signed)
Requested medications are due for refill today yes  Requested medications are on the active medication list yes  Last visit 08/18/20  Future visit scheduled 10/13/20  Notes to clinic This med is not delegated.

## 2020-10-08 MED ORDER — HYDROCODONE-ACETAMINOPHEN 5-325 MG PO TABS: 1.0000 | ORAL_TABLET | Freq: Two times a day (BID) | ORAL | 0 refills | Status: AC | PRN

## 2020-10-13 ENCOUNTER — Other Ambulatory Visit: Payer: Self-pay

## 2020-10-13 ENCOUNTER — Encounter: Payer: Self-pay | Admitting: Family Medicine

## 2020-10-13 ENCOUNTER — Ambulatory Visit (INDEPENDENT_AMBULATORY_CARE_PROVIDER_SITE_OTHER): Payer: Medicare Other | Admitting: Family Medicine

## 2020-10-13 VITALS — BP 113/73 | HR 88 | Temp 98.3°F | Resp 18 | Wt 167.2 lb

## 2020-10-13 DIAGNOSIS — I10 Essential (primary) hypertension: Secondary | ICD-10-CM

## 2020-10-13 DIAGNOSIS — I5021 Acute systolic (congestive) heart failure: Secondary | ICD-10-CM

## 2020-10-13 DIAGNOSIS — I739 Peripheral vascular disease, unspecified: Secondary | ICD-10-CM

## 2020-10-13 DIAGNOSIS — I4891 Unspecified atrial fibrillation: Secondary | ICD-10-CM | POA: Diagnosis not present

## 2020-10-13 DIAGNOSIS — G5793 Unspecified mononeuropathy of bilateral lower limbs: Secondary | ICD-10-CM

## 2020-10-13 DIAGNOSIS — I872 Venous insufficiency (chronic) (peripheral): Secondary | ICD-10-CM

## 2020-10-13 DIAGNOSIS — Z789 Other specified health status: Secondary | ICD-10-CM

## 2020-10-13 DIAGNOSIS — R601 Generalized edema: Secondary | ICD-10-CM

## 2020-10-13 DIAGNOSIS — Z8673 Personal history of transient ischemic attack (TIA), and cerebral infarction without residual deficits: Secondary | ICD-10-CM

## 2020-10-13 DIAGNOSIS — F5101 Primary insomnia: Secondary | ICD-10-CM

## 2020-10-13 MED ORDER — TRAZODONE HCL 100 MG PO TABS: 50.0000 mg | ORAL_TABLET | Freq: Every day | ORAL | 0 refills | Status: AC

## 2020-10-13 NOTE — Progress Notes (Signed)
Established patient visit   Patient: Kayla Maxwell   DOB: [birth year]   75 y.o. Female  MRN: [id number] Visit Date: 10/13/2020  Today's healthcare provider: Gwyneth Sprout, FNP   Chief Complaint  Patient presents with   Follow-up   Subjective    HPI  Follow up for PAD  The patient was last seen for this 2 months ago. Changes made at last visit include   Chronic condition Controlled with use of Plavix and Gabapentin Change in gabapentin dose/time d/t drowsiness   She reports excellent compliance with treatment. She feels that condition is Unchanged. She is not having side effects.   -----------------------------------------------------------------------------------------  Follow up for Neuropathy of both feet  The patient was last seen for this 2 months ago. Changes made at last visit include Change in gaba; decreased dose of norco.  She reports good compliance with treatment. She feels that condition is Improved. She is not having side effects.   -----------------------------------------------------------------------------------------   Medications: Outpatient Medications Prior to Visit  Medication Sig   albuterol (VENTOLIN HFA) 108 (90 Base) MCG/ACT inhaler Inhale 2 puffs into the lungs every 6 (six) hours as needed for wheezing or shortness of breath.   clopidogrel (PLAVIX) 75 MG tablet Take 75 mg by mouth daily.   famotidine (PEPCID) 20 MG tablet Take 1 tablet (20 mg total) by mouth 2 (two) times daily.   FLUoxetine (PROZAC) 20 MG capsule Take 1 capsule (20 mg total) by mouth daily.   furosemide (LASIX) 20 MG tablet Take 1 tablet (20 mg total) by mouth daily as needed for fluid or edema.   gabapentin (NEURONTIN) 300 MG capsule Take 2 capsules (600 mg total) by mouth at bedtime.   HYDROcodone-acetaminophen (NORCO) 5-325 MG tablet Take 1-2 tablets by mouth 2 (two) times daily as needed for moderate pain. Do not fill within 30 days of previous refill. Thank you.    lisinopril (ZESTRIL) 2.5 MG tablet TAKE 1 TABLET BY MOUTH EVERY DAY   metoprolol tartrate (LOPRESSOR) 25 MG tablet Take 2 tablets (50 mg total) by mouth 2 (two) times daily.   nitroGLYCERIN (NITROSTAT) 0.4 MG SL tablet Place 1 tablet (0.4 mg total) under the tongue every 5 (five) minutes x 3 doses as needed for chest pain.   omeprazole (PRILOSEC) 40 MG capsule Take 1 capsule (40 mg total) by mouth daily.   ondansetron (ZOFRAN ODT) 4 MG disintegrating tablet Take 1 tablet (4 mg total) by mouth every 8 (eight) hours as needed for nausea or vomiting.   rivaroxaban (XARELTO) 20 MG TABS tablet Take 20 mg by mouth daily with supper.    rosuvastatin (CRESTOR) 20 MG tablet Take 1 tablet (20 mg total) by mouth daily.   No facility-administered medications prior to visit.    Review of Systems     Objective    BP 113/73   Pulse 88   Temp 98.3 F (36.8 C) (Oral)   Resp 18   Wt 167 lb 3.2 oz (75.8 kg)   BMI 27.82 kg/m  {Show previous vital signs (optional):23777}  Physical Exam Vitals and nursing note reviewed.  Constitutional:      General: She is not in acute distress.    Appearance: Normal appearance. She is overweight. She is not ill-appearing, toxic-appearing or diaphoretic.  HENT:     Head: Normocephalic and atraumatic.  Cardiovascular:     Rate and Rhythm: Normal rate and regular rhythm.     Pulses: Normal pulses.  Heart sounds: Normal heart sounds. No murmur heard.   No friction rub. No gallop.  Pulmonary:     Effort: Pulmonary effort is normal. No respiratory distress.     Breath sounds: Normal breath sounds. No stridor. No wheezing, rhonchi or rales.  Chest:     Chest wall: No tenderness.  Abdominal:     General: Bowel sounds are normal.     Palpations: Abdomen is soft.     Tenderness: There is abdominal tenderness.     Comments: C/o divert s/s; continues to have BM Will hold off on ABX at this time Pt/daughter to report back to clinic if needed  Musculoskeletal:         General: Swelling present. No deformity or signs of injury. Normal range of motion.     Right lower leg: Tenderness present. 3+ Edema present.     Left lower leg: Tenderness present. 2+ Edema present.       Legs:     Comments: Discoloration bilateral Pink/purple toes  Skin:    General: Skin is warm and dry.     Capillary Refill: Capillary refill takes less than 2 seconds.     Coloration: Skin is not jaundiced or pale.     Findings: No bruising, erythema, lesion or rash.  Neurological:     General: No focal deficit present.     Mental Status: She is alert and oriented to person, place, and time. Mental status is at baseline.     Cranial Nerves: No cranial nerve deficit.     Sensory: Sensory deficit present.     Motor: Weakness present.     Coordination: Coordination normal.     Gait: Gait abnormal.     Comments: Worsening PAD- continued neuropathy Using Cane for weakness   Psychiatric:        Mood and Affect: Mood normal.        Behavior: Behavior normal.        Thought Content: Thought content normal.        Judgment: Judgment normal.     No results found for any visits on 10/13/20.  Assessment & Plan     Problem List Items Addressed This Visit       Cardiovascular and Mediastinum   PAD (peripheral artery disease) ([place])    Doing well with decrease in norco Continue to have sleeping difficulties despite increase in gabapentin at night Remains on plavix      Relevant Orders   Ambulatory referral to Vascular Surgery   Benign essential HTN - Primary    Remains on BB alone Appears to be in reg rhythm- no ecg done BP controlled      Venous insufficiency of both lower extremities   Relevant Orders   Ambulatory referral to Vascular Surgery   Acute systolic CHF (congestive heart failure) (H[facility])    Ref to cards Endorses SOB No wheezes heard on exam Pitting LE edema today Perhaps would benefit from lasix      Relevant Orders   Ambulatory referral to  Cardiology     Nervous and Auditory   Neuropathy of both feet    Pt endorses elevation of feet Will send referral today for vascular cx for possible re-stenting need      Relevant Medications   traZODone (DESYREL) 100 MG tablet     Other   Edema    LE edema in BLE      Atrial fibrillation by electrocardiography ([place])    In SR today, on  BB tx      Relevant Orders   Ambulatory referral to Cardiology   Decreased activities of daily living (ADL)    Limited by endurance, SOB Using cane for ambulation Denies falls      RESOLVED: H/O stroke without residual deficits   Relevant Orders   Ambulatory referral to Cardiology   Other Visit Diagnoses     Primary insomnia       Relevant Medications   traZODone (DESYREL) 100 MG tablet        Return in about 3 months (around 01/13/2021) for chonic disease management.      Vonna Kotyk, FNP, have reviewed all documentation for this visit. The documentation on 10/13/20 for the exam, diagnosis, procedures, and orders are all accurate and complete.    Gwyneth Sprout, [place] [phone] (phone) [phone] (fax)  [place]

## 2020-10-13 NOTE — Assessment & Plan Note (Signed)
Limited by endurance, SOB Using cane for ambulation Denies falls

## 2020-10-13 NOTE — Assessment & Plan Note (Signed)
Remains on BB alone Appears to be in reg rhythm- no ecg done BP controlled

## 2020-10-13 NOTE — Assessment & Plan Note (Signed)
LE edema in BLE

## 2020-10-13 NOTE — Assessment & Plan Note (Signed)
Pt endorses elevation of feet Will send referral today for vascular cx for possible re-stenting need

## 2020-10-13 NOTE — Assessment & Plan Note (Signed)
Doing well with decrease in norco Continue to have sleeping difficulties despite increase in gabapentin at night Remains on plavix

## 2020-10-13 NOTE — Assessment & Plan Note (Signed)
In SR today, on BB tx

## 2020-10-13 NOTE — Assessment & Plan Note (Signed)
Ref to cards Endorses SOB No wheezes heard on exam Pitting LE edema today Perhaps would benefit from lasix

## 2020-10-15 ENCOUNTER — Emergency Department: Payer: Medicare Other

## 2020-10-15 ENCOUNTER — Encounter: Payer: Self-pay | Admitting: Emergency Medicine

## 2020-10-15 ENCOUNTER — Other Ambulatory Visit: Payer: Self-pay

## 2020-10-15 ENCOUNTER — Inpatient Hospital Stay
Admission: EM | Admit: 2020-10-15 | Discharge: 2020-10-23 | DRG: 190 | Disposition: A | Discharge status: Skilled Nursing Facility | Payer: Medicare Other | Attending: Family Medicine | Admitting: Family Medicine

## 2020-10-15 DIAGNOSIS — I69351 Hemiplegia and hemiparesis following cerebral infarction affecting right dominant side: Secondary | ICD-10-CM

## 2020-10-15 DIAGNOSIS — G629 Polyneuropathy, unspecified: Secondary | ICD-10-CM | POA: Diagnosis not present

## 2020-10-15 DIAGNOSIS — I5021 Acute systolic (congestive) heart failure: Secondary | ICD-10-CM | POA: Diagnosis not present

## 2020-10-15 DIAGNOSIS — J441 Chronic obstructive pulmonary disease with (acute) exacerbation: Secondary | ICD-10-CM | POA: Diagnosis present

## 2020-10-15 DIAGNOSIS — M810 Age-related osteoporosis without current pathological fracture: Secondary | ICD-10-CM | POA: Diagnosis present

## 2020-10-15 DIAGNOSIS — G4733 Obstructive sleep apnea (adult) (pediatric): Secondary | ICD-10-CM | POA: Diagnosis present

## 2020-10-15 DIAGNOSIS — L89151 Pressure ulcer of sacral region, stage 1: Secondary | ICD-10-CM | POA: Diagnosis present

## 2020-10-15 DIAGNOSIS — J9690 Respiratory failure, unspecified, unspecified whether with hypoxia or hypercapnia: Secondary | ICD-10-CM | POA: Diagnosis not present

## 2020-10-15 DIAGNOSIS — G8929 Other chronic pain: Secondary | ICD-10-CM | POA: Diagnosis present

## 2020-10-15 DIAGNOSIS — F4312 Post-traumatic stress disorder, chronic: Secondary | ICD-10-CM | POA: Diagnosis not present

## 2020-10-15 DIAGNOSIS — Z743 Need for continuous supervision: Secondary | ICD-10-CM | POA: Diagnosis not present

## 2020-10-15 DIAGNOSIS — M7989 Other specified soft tissue disorders: Secondary | ICD-10-CM

## 2020-10-15 DIAGNOSIS — Z79899 Other long term (current) drug therapy: Secondary | ICD-10-CM

## 2020-10-15 DIAGNOSIS — E785 Hyperlipidemia, unspecified: Secondary | ICD-10-CM | POA: Diagnosis present

## 2020-10-15 DIAGNOSIS — Z7902 Long term (current) use of antithrombotics/antiplatelets: Secondary | ICD-10-CM

## 2020-10-15 DIAGNOSIS — E1151 Type 2 diabetes mellitus with diabetic peripheral angiopathy without gangrene: Secondary | ICD-10-CM | POA: Diagnosis present

## 2020-10-15 DIAGNOSIS — I872 Venous insufficiency (chronic) (peripheral): Secondary | ICD-10-CM | POA: Diagnosis present

## 2020-10-15 DIAGNOSIS — Z20822 Contact with and (suspected) exposure to covid-19: Secondary | ICD-10-CM | POA: Diagnosis present

## 2020-10-15 DIAGNOSIS — I4891 Unspecified atrial fibrillation: Secondary | ICD-10-CM | POA: Diagnosis not present

## 2020-10-15 DIAGNOSIS — R0600 Dyspnea, unspecified: Secondary | ICD-10-CM | POA: Diagnosis not present

## 2020-10-15 DIAGNOSIS — R531 Weakness: Secondary | ICD-10-CM | POA: Diagnosis not present

## 2020-10-15 DIAGNOSIS — K219 Gastro-esophageal reflux disease without esophagitis: Secondary | ICD-10-CM | POA: Diagnosis present

## 2020-10-15 DIAGNOSIS — I3139 Other pericardial effusion (noninflammatory): Secondary | ICD-10-CM | POA: Diagnosis not present

## 2020-10-15 DIAGNOSIS — R0689 Other abnormalities of breathing: Secondary | ICD-10-CM | POA: Diagnosis not present

## 2020-10-15 DIAGNOSIS — Z8249 Family history of ischemic heart disease and other diseases of the circulatory system: Secondary | ICD-10-CM

## 2020-10-15 DIAGNOSIS — Z882 Allergy status to sulfonamides status: Secondary | ICD-10-CM

## 2020-10-15 DIAGNOSIS — I252 Old myocardial infarction: Secondary | ICD-10-CM | POA: Diagnosis not present

## 2020-10-15 DIAGNOSIS — Z91048 Other nonmedicinal substance allergy status: Secondary | ICD-10-CM

## 2020-10-15 DIAGNOSIS — R040 Epistaxis: Secondary | ICD-10-CM

## 2020-10-15 DIAGNOSIS — R578 Other shock: Secondary | ICD-10-CM | POA: Diagnosis present

## 2020-10-15 DIAGNOSIS — R0602 Shortness of breath: Secondary | ICD-10-CM | POA: Diagnosis not present

## 2020-10-15 DIAGNOSIS — Z87891 Personal history of nicotine dependence: Secondary | ICD-10-CM

## 2020-10-15 DIAGNOSIS — I480 Paroxysmal atrial fibrillation: Secondary | ICD-10-CM | POA: Diagnosis present

## 2020-10-15 DIAGNOSIS — D638 Anemia in other chronic diseases classified elsewhere: Secondary | ICD-10-CM | POA: Diagnosis present

## 2020-10-15 DIAGNOSIS — I517 Cardiomegaly: Secondary | ICD-10-CM | POA: Diagnosis not present

## 2020-10-15 DIAGNOSIS — Z91199 Patient's noncompliance with other medical treatment and regimen due to unspecified reason: Secondary | ICD-10-CM

## 2020-10-15 DIAGNOSIS — J90 Pleural effusion, not elsewhere classified: Secondary | ICD-10-CM

## 2020-10-15 DIAGNOSIS — W010XXA Fall on same level from slipping, tripping and stumbling without subsequent striking against object, initial encounter: Secondary | ICD-10-CM | POA: Diagnosis present

## 2020-10-15 DIAGNOSIS — I739 Peripheral vascular disease, unspecified: Secondary | ICD-10-CM | POA: Diagnosis not present

## 2020-10-15 DIAGNOSIS — Z90710 Acquired absence of both cervix and uterus: Secondary | ICD-10-CM

## 2020-10-15 DIAGNOSIS — Z8619 Personal history of other infectious and parasitic diseases: Secondary | ICD-10-CM

## 2020-10-15 DIAGNOSIS — I5023 Acute on chronic systolic (congestive) heart failure: Secondary | ICD-10-CM | POA: Diagnosis present

## 2020-10-15 DIAGNOSIS — E611 Iron deficiency: Secondary | ICD-10-CM | POA: Diagnosis present

## 2020-10-15 DIAGNOSIS — R579 Shock, unspecified: Secondary | ICD-10-CM

## 2020-10-15 DIAGNOSIS — L8990 Pressure ulcer of unspecified site, unspecified stage: Secondary | ICD-10-CM | POA: Insufficient documentation

## 2020-10-15 DIAGNOSIS — L538 Other specified erythematous conditions: Secondary | ICD-10-CM | POA: Diagnosis present

## 2020-10-15 DIAGNOSIS — M6259 Muscle wasting and atrophy, not elsewhere classified, multiple sites: Secondary | ICD-10-CM | POA: Diagnosis not present

## 2020-10-15 DIAGNOSIS — I340 Nonrheumatic mitral (valve) insufficiency: Secondary | ICD-10-CM | POA: Diagnosis present

## 2020-10-15 DIAGNOSIS — Z91040 Latex allergy status: Secondary | ICD-10-CM

## 2020-10-15 DIAGNOSIS — E538 Deficiency of other specified B group vitamins: Secondary | ICD-10-CM | POA: Diagnosis present

## 2020-10-15 DIAGNOSIS — J439 Emphysema, unspecified: Secondary | ICD-10-CM | POA: Diagnosis not present

## 2020-10-15 DIAGNOSIS — Z741 Need for assistance with personal care: Secondary | ICD-10-CM | POA: Diagnosis not present

## 2020-10-15 DIAGNOSIS — Z885 Allergy status to narcotic agent status: Secondary | ICD-10-CM

## 2020-10-15 DIAGNOSIS — K2100 Gastro-esophageal reflux disease with esophagitis, without bleeding: Secondary | ICD-10-CM | POA: Diagnosis not present

## 2020-10-15 DIAGNOSIS — G9341 Metabolic encephalopathy: Secondary | ICD-10-CM | POA: Diagnosis present

## 2020-10-15 DIAGNOSIS — J449 Chronic obstructive pulmonary disease, unspecified: Secondary | ICD-10-CM | POA: Diagnosis not present

## 2020-10-15 DIAGNOSIS — Z9049 Acquired absence of other specified parts of digestive tract: Secondary | ICD-10-CM

## 2020-10-15 DIAGNOSIS — G9009 Other idiopathic peripheral autonomic neuropathy: Secondary | ICD-10-CM | POA: Diagnosis not present

## 2020-10-15 DIAGNOSIS — I959 Hypotension, unspecified: Secondary | ICD-10-CM | POA: Diagnosis not present

## 2020-10-15 DIAGNOSIS — N179 Acute kidney failure, unspecified: Secondary | ICD-10-CM | POA: Diagnosis present

## 2020-10-15 DIAGNOSIS — L03115 Cellulitis of right lower limb: Secondary | ICD-10-CM

## 2020-10-15 DIAGNOSIS — F32A Depression, unspecified: Secondary | ICD-10-CM | POA: Diagnosis not present

## 2020-10-15 DIAGNOSIS — I2510 Atherosclerotic heart disease of native coronary artery without angina pectoris: Secondary | ICD-10-CM | POA: Diagnosis present

## 2020-10-15 DIAGNOSIS — I10 Essential (primary) hypertension: Secondary | ICD-10-CM | POA: Diagnosis not present

## 2020-10-15 DIAGNOSIS — Z66 Do not resuscitate: Secondary | ICD-10-CM | POA: Diagnosis present

## 2020-10-15 DIAGNOSIS — I700 Atherosclerosis of aorta: Secondary | ICD-10-CM | POA: Diagnosis not present

## 2020-10-15 DIAGNOSIS — R570 Cardiogenic shock: Secondary | ICD-10-CM | POA: Diagnosis not present

## 2020-10-15 DIAGNOSIS — R2681 Unsteadiness on feet: Secondary | ICD-10-CM | POA: Diagnosis not present

## 2020-10-15 DIAGNOSIS — Z794 Long term (current) use of insulin: Secondary | ICD-10-CM

## 2020-10-15 DIAGNOSIS — I110 Hypertensive heart disease with heart failure: Secondary | ICD-10-CM | POA: Diagnosis present

## 2020-10-15 DIAGNOSIS — Z955 Presence of coronary angioplasty implant and graft: Secondary | ICD-10-CM

## 2020-10-15 DIAGNOSIS — R0989 Other specified symptoms and signs involving the circulatory and respiratory systems: Secondary | ICD-10-CM

## 2020-10-15 DIAGNOSIS — Z7901 Long term (current) use of anticoagulants: Secondary | ICD-10-CM

## 2020-10-15 DIAGNOSIS — J811 Chronic pulmonary edema: Secondary | ICD-10-CM | POA: Diagnosis not present

## 2020-10-15 DIAGNOSIS — R059 Cough, unspecified: Secondary | ICD-10-CM | POA: Diagnosis not present

## 2020-10-15 DIAGNOSIS — Z880 Allergy status to penicillin: Secondary | ICD-10-CM

## 2020-10-15 DIAGNOSIS — I509 Heart failure, unspecified: Secondary | ICD-10-CM | POA: Diagnosis not present

## 2020-10-15 DIAGNOSIS — R5381 Other malaise: Secondary | ICD-10-CM | POA: Diagnosis not present

## 2020-10-15 DIAGNOSIS — J9811 Atelectasis: Secondary | ICD-10-CM | POA: Diagnosis not present

## 2020-10-15 DIAGNOSIS — J9621 Acute and chronic respiratory failure with hypoxia: Secondary | ICD-10-CM | POA: Diagnosis not present

## 2020-10-15 DIAGNOSIS — M19041 Primary osteoarthritis, right hand: Secondary | ICD-10-CM | POA: Diagnosis not present

## 2020-10-15 DIAGNOSIS — J99 Respiratory disorders in diseases classified elsewhere: Secondary | ICD-10-CM | POA: Diagnosis not present

## 2020-10-15 DIAGNOSIS — Z9882 Breast implant status: Secondary | ICD-10-CM

## 2020-10-15 DIAGNOSIS — J9601 Acute respiratory failure with hypoxia: Secondary | ICD-10-CM | POA: Diagnosis not present

## 2020-10-15 DIAGNOSIS — E119 Type 2 diabetes mellitus without complications: Secondary | ICD-10-CM | POA: Diagnosis not present

## 2020-10-15 DIAGNOSIS — R42 Dizziness and giddiness: Secondary | ICD-10-CM | POA: Diagnosis not present

## 2020-10-15 DIAGNOSIS — J9602 Acute respiratory failure with hypercapnia: Secondary | ICD-10-CM | POA: Diagnosis present

## 2020-10-15 DIAGNOSIS — Z79891 Long term (current) use of opiate analgesic: Secondary | ICD-10-CM

## 2020-10-15 DIAGNOSIS — J302 Other seasonal allergic rhinitis: Secondary | ICD-10-CM | POA: Diagnosis present

## 2020-10-15 DIAGNOSIS — R0902 Hypoxemia: Secondary | ICD-10-CM | POA: Diagnosis not present

## 2020-10-15 DIAGNOSIS — J111 Influenza due to unidentified influenza virus with other respiratory manifestations: Secondary | ICD-10-CM | POA: Diagnosis not present

## 2020-10-15 DIAGNOSIS — S52501A Unspecified fracture of the lower end of right radius, initial encounter for closed fracture: Secondary | ICD-10-CM | POA: Diagnosis not present

## 2020-10-15 DIAGNOSIS — I70229 Atherosclerosis of native arteries of extremities with rest pain, unspecified extremity: Secondary | ICD-10-CM | POA: Diagnosis not present

## 2020-10-15 DIAGNOSIS — W19XXXA Unspecified fall, initial encounter: Secondary | ICD-10-CM | POA: Diagnosis not present

## 2020-10-15 DIAGNOSIS — E559 Vitamin D deficiency, unspecified: Secondary | ICD-10-CM | POA: Diagnosis present

## 2020-10-15 DIAGNOSIS — J9600 Acute respiratory failure, unspecified whether with hypoxia or hypercapnia: Secondary | ICD-10-CM | POA: Diagnosis not present

## 2020-10-15 DIAGNOSIS — Z8541 Personal history of malignant neoplasm of cervix uteri: Secondary | ICD-10-CM

## 2020-10-15 LAB — CBC
HCT: 37.4 % (ref 36.0–46.0)
Hemoglobin: 10.1 g/dL — ABNORMAL LOW (ref 12.0–15.0)
MCH: 21.8 pg — ABNORMAL LOW (ref 26.0–34.0)
MCHC: 27 g/dL — ABNORMAL LOW (ref 30.0–36.0)
MCV: 80.6 fL (ref 80.0–100.0)
Platelets: 270 10*3/uL (ref 150–400)
RBC: 4.64 MIL/uL (ref 3.87–5.11)
RDW: 17.1 % — ABNORMAL HIGH (ref 11.5–15.5)
WBC: 9.7 10*3/uL (ref 4.0–10.5)
nRBC: 0 % (ref 0.0–0.2)

## 2020-10-15 LAB — COMPREHENSIVE METABOLIC PANEL
ALT: 17 U/L (ref 0–44)
AST: 20 U/L (ref 15–41)
Albumin: 3.5 g/dL (ref 3.5–5.0)
Alkaline Phosphatase: 66 U/L (ref 38–126)
Anion gap: 4 — ABNORMAL LOW (ref 5–15)
BUN: 35 mg/dL — ABNORMAL HIGH (ref 8–23)
CO2: 35 mmol/L — ABNORMAL HIGH (ref 22–32)
Calcium: 8.6 mg/dL — ABNORMAL LOW (ref 8.9–10.3)
Chloride: 100 mmol/L (ref 98–111)
Creatinine, Ser: 1.13 mg/dL — ABNORMAL HIGH (ref 0.44–1.00)
GFR, Estimated: 51 mL/min — ABNORMAL LOW (ref >60)
Glucose, Bld: 119 mg/dL — ABNORMAL HIGH (ref 70–99)
Potassium: 4.2 mmol/L (ref 3.5–5.1)
Sodium: 139 mmol/L (ref 135–145)
Total Bilirubin: 0.8 mg/dL (ref 0.3–1.2)
Total Protein: 5.9 g/dL — ABNORMAL LOW (ref 6.5–8.1)

## 2020-10-15 LAB — RESP PANEL BY RT-PCR (FLU A&B, COVID) ARPGX2
Influenza A by PCR: NEGATIVE
Influenza B by PCR: NEGATIVE
SARS Coronavirus 2 by RT PCR: NEGATIVE

## 2020-10-15 LAB — DIFFERENTIAL
Abs Immature Granulocytes: 0.04 10*3/uL (ref 0.00–0.07)
Basophils Absolute: 0.1 10*3/uL (ref 0.0–0.1)
Basophils Relative: 1 %
Eosinophils Absolute: 0.1 10*3/uL (ref 0.0–0.5)
Eosinophils Relative: 1 %
Immature Granulocytes: 0 %
Lymphocytes Relative: 11 %
Lymphs Abs: 1 10*3/uL (ref 0.7–4.0)
Monocytes Absolute: 0.6 10*3/uL (ref 0.1–1.0)
Monocytes Relative: 7 %
Neutro Abs: 7.8 10*3/uL — ABNORMAL HIGH (ref 1.7–7.7)
Neutrophils Relative %: 80 %

## 2020-10-15 LAB — BLOOD GAS, VENOUS
Acid-Base Excess: 8.7 mmol/L — ABNORMAL HIGH (ref 0.0–2.0)
Bicarbonate: 38.1 mmol/L — ABNORMAL HIGH (ref 20.0–28.0)
O2 Saturation: 51 %
Patient temperature: 37
pCO2, Ven: 85 mmHg (ref 44.0–60.0)
pH, Ven: 7.26 (ref 7.250–7.430)
pO2, Ven: 32 mmHg (ref 32.0–45.0)

## 2020-10-15 LAB — LACTIC ACID, PLASMA
Lactic Acid, Venous: 1 mmol/L (ref 0.5–1.9)
Lactic Acid, Venous: 1 mmol/L (ref 0.5–1.9)

## 2020-10-15 LAB — PROTIME-INR
INR: 1.9 — ABNORMAL HIGH (ref 0.8–1.2)
Prothrombin Time: 21.4 seconds — ABNORMAL HIGH (ref 11.4–15.2)

## 2020-10-15 LAB — BRAIN NATRIURETIC PEPTIDE: B Natriuretic Peptide: 967.2 pg/mL — ABNORMAL HIGH (ref 0.0–100.0)

## 2020-10-15 LAB — PROCALCITONIN: Procalcitonin: <0.1 ng/mL

## 2020-10-15 LAB — APTT: aPTT: 39 seconds — ABNORMAL HIGH (ref 24–36)

## 2020-10-15 LAB — TROPONIN I (HIGH SENSITIVITY)
Troponin I (High Sensitivity): 63 ng/L — ABNORMAL HIGH (ref <18)
Troponin I (High Sensitivity): 73 ng/L — ABNORMAL HIGH (ref <18)

## 2020-10-15 MED ORDER — BUDESONIDE 0.25 MG/2ML IN SUSP
0.2500 mg | Freq: Two times a day (BID) | RESPIRATORY_TRACT | Status: DC
  Administered 2020-10-16 (×3): 0.25 mg via RESPIRATORY_TRACT
  Filled 2020-10-15 (×4): qty 2

## 2020-10-15 MED ORDER — LACTATED RINGERS IV BOLUS
500.0000 mL | Freq: Once | INTRAVENOUS | Status: AC
  Administered 2020-10-15: 500 mL via INTRAVENOUS

## 2020-10-15 MED ORDER — ONDANSETRON 4 MG PO TBDP
4.0000 mg | ORAL_TABLET | Freq: Three times a day (TID) | ORAL | Status: DC | PRN
  Filled 2020-10-15: qty 1

## 2020-10-15 MED ORDER — FAMOTIDINE 20 MG PO TABS
20.0000 mg | ORAL_TABLET | Freq: Two times a day (BID) | ORAL | Status: DC
  Administered 2020-10-16 – 2020-10-23 (×16): 20 mg via ORAL
  Filled 2020-10-15 (×16): qty 1

## 2020-10-15 MED ORDER — PANTOPRAZOLE SODIUM 40 MG PO TBEC
40.0000 mg | DELAYED_RELEASE_TABLET | Freq: Every day | ORAL | Status: DC
  Administered 2020-10-16 – 2020-10-23 (×9): 40 mg via ORAL
  Filled 2020-10-15 (×9): qty 1

## 2020-10-15 MED ORDER — VANCOMYCIN HCL 1500 MG/300ML IV SOLN
1500.0000 mg | Freq: Once | INTRAVENOUS | Status: AC
  Administered 2020-10-15: 1500 mg via INTRAVENOUS
  Filled 2020-10-15: qty 300

## 2020-10-15 MED ORDER — DOCUSATE SODIUM 100 MG PO CAPS: 100.0000 mg | ORAL_CAPSULE | Freq: Two times a day (BID) | ORAL | Status: DC | PRN

## 2020-10-15 MED ORDER — NITROGLYCERIN 0.4 MG SL SUBL: 0.4000 mg | SUBLINGUAL_TABLET | SUBLINGUAL | Status: DC | PRN

## 2020-10-15 MED ORDER — POLYETHYLENE GLYCOL 3350 17 G PO PACK: 17.0000 g | PACK | Freq: Every day | ORAL | Status: DC | PRN

## 2020-10-15 MED ORDER — IPRATROPIUM-ALBUTEROL 0.5-2.5 (3) MG/3ML IN SOLN
3.0000 mL | Freq: Four times a day (QID) | RESPIRATORY_TRACT | Status: DC | PRN
Start: 2020-10-15 — End: 2020-10-15

## 2020-10-15 MED ORDER — RIVAROXABAN 20 MG PO TABS
20.0000 mg | ORAL_TABLET | Freq: Every day | ORAL | Status: DC
  Administered 2020-10-16 – 2020-10-21 (×7): 20 mg via ORAL
  Filled 2020-10-15 (×8): qty 1

## 2020-10-15 MED ORDER — FUROSEMIDE 10 MG/ML IJ SOLN
20.0000 mg | Freq: Two times a day (BID) | INTRAMUSCULAR | Status: DC
  Administered 2020-10-16 (×2): 20 mg via INTRAVENOUS
  Filled 2020-10-15 (×2): qty 4

## 2020-10-15 MED ORDER — IPRATROPIUM-ALBUTEROL 0.5-2.5 (3) MG/3ML IN SOLN
3.0000 mL | Freq: Four times a day (QID) | RESPIRATORY_TRACT | Status: DC
  Administered 2020-10-16 – 2020-10-23 (×29): 3 mL via RESPIRATORY_TRACT
  Filled 2020-10-15 (×30): qty 3

## 2020-10-15 MED ORDER — ROSUVASTATIN CALCIUM 10 MG PO TABS
20.0000 mg | ORAL_TABLET | Freq: Every day | ORAL | Status: DC
  Administered 2020-10-16 – 2020-10-23 (×8): 20 mg via ORAL
  Filled 2020-10-15: qty 2
  Filled 2020-10-15: qty 1
  Filled 2020-10-15 (×3): qty 2
  Filled 2020-10-15: qty 1
  Filled 2020-10-15 (×2): qty 2

## 2020-10-15 MED ORDER — CLOPIDOGREL BISULFATE 75 MG PO TABS
75.0000 mg | ORAL_TABLET | Freq: Every day | ORAL | Status: DC
  Administered 2020-10-16 – 2020-10-23 (×9): 75 mg via ORAL
  Filled 2020-10-15 (×9): qty 1

## 2020-10-15 MED ORDER — FLUOXETINE HCL 20 MG PO CAPS
20.0000 mg | ORAL_CAPSULE | Freq: Every day | ORAL | Status: DC
  Administered 2020-10-16 – 2020-10-23 (×9): 20 mg via ORAL
  Filled 2020-10-15 (×9): qty 1

## 2020-10-15 MED ORDER — SODIUM CHLORIDE 0.9 % IV SOLN
2.0000 g | Freq: Once | INTRAVENOUS | Status: AC
  Administered 2020-10-16: 2 g via INTRAVENOUS
  Filled 2020-10-15: qty 2

## 2020-10-15 NOTE — ED Triage Notes (Signed)
Pt comes into the ED via [facility] from home c/o weakness.  Pt also admits to some dizziness which has caused 2 falls with no injuries.  Pt has pitting edema present in both feet.  No saturation level able to be obtained.  Pt currently on 4L which is not her baseline.  Pt placed on this for tachypnea.   30 RR A-fib with rate of 50-90's 97/65 97.9 oral CBG 151

## 2020-10-15 NOTE — ED Provider Notes (Signed)
[facility] Emergency Department Provider Note   ____________________________________________   Event Date/Time   First MD Initiated Contact with Patient 10/15/20 2207     (approximate)  I have reviewed the triage vital signs and the nursing notes.   HISTORY  Chief Complaint Dizziness    HPI Kayla Maxwell is a 75 y.o. 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  Daughter found her weak and she had possibly fallen.  The last couple days though she has been a little bit confused very weak feeling fatigued more so than typical.  Daughter has noticed that her toes have looked purple and her hands look purple today as well.  She also noticed her feet look a bit swollen  Patient went to answer her door and felt like she almost passed out she does not think she lost consciousness she does not think she hit her head but she is not sure.  She does have atrial fibrillation congestive heart failure severely weakened heart from previous MI  Past Medical History:  Diagnosis Date   Acid reflux    takes Nexium daily   Allergy    takes Singulair daily as needed   Anemia    Atrial fibrillation (HCC)    Cancer (HCC) AGE 43   CERVICAL cancer with removal   CHF (congestive heart failure) (HCC)    Chronic back pain    stenosis   Complication of anesthesia    Constipation    takes Colace daily as needed   Coronary artery disease    Difficult intubation    Dysrhythmia    atrial fibrillation dx 04/2014   Headache    sinus    History of blood transfusion    History of shingles    Hyperlipidemia    takes Pravastatin daily-started taking this 6 months ago   Hypertension    Lumbar surgical wound fluid collection    Sleep apnea    CPAP   Stroke ([place])    TIA'S X 2 --- one last yr and one this yr...lost her memory over  4-6 hrs.   Stroke [facility]) 06-04-15   left with right side weakness   TIA (transient ischemic attack)    showed up on an MRI but she never knew it   Urinary frequency     Patient Active Problem List   Diagnosis Date Noted   Acute respiratory failure with hypoxia ([place]) 10/15/2020   Neuropathy of both feet 08/18/2020   Decreased activities of daily living (ADL) 24/40/1027   Acute systolic CHF (congestive heart failure) ([place]) 03/20/2020   Venous insufficiency of both lower extremities 11/02/2017   Benign essential HTN 11/01/2016   PAD (peripheral artery disease) ([place]) 01/14/2016   Edema 09/05/2014   Atrial fibrillation by electrocardiography ([place]) 04/29/2014    Past Surgical History:  Procedure Laterality Date   ABDOMINAL HYSTERECTOMY     APPENDECTOMY  1963   BACK SURGERY  08/19/2014   Dr. Hal Neer ([place])   [place]  2014   Dr Jamal Collin   COLONOSCOPY     CORONARY STENT INTERVENTION N/A 02/13/2020   Procedure: CORONARY STENT INTERVENTION;  Surgeon: Yolonda Kida, MD;  Location: [place] CV LAB;  Service: Cardiovascular;  Laterality: N/A;   ESOPHAGOGASTRODUODENOSCOPY  HAMMER TOE SURGERY Bilateral    LEFT HEART CATH AND CORONARY ANGIOGRAPHY N/A 02/13/2020   Procedure: LEFT HEART CATH AND CORONARY ANGIOGRAPHY and possible PCI and stent;  Surgeon: Yolonda Kida, MD;  Location: [place] CV LAB;  Service: Cardiovascular;  Laterality: N/A;   LOWER EXTREMITY ANGIOGRAPHY Left 01/23/2019   Procedure: LOWER EXTREMITY ANGIOGRAPHY;  Surgeon: Katha Cabal, MD;  Location: [place] CV LAB;  Service: Cardiovascular;  Laterality: Left;   LOWER EXTREMITY ANGIOGRAPHY Left 02/20/2019   Procedure: LOWER EXTREMITY ANGIOGRAPHY;  Surgeon: Katha Cabal, MD;  Location: [place] CV LAB;  Service: Cardiovascular;  Laterality: Left;   LUMBAR LAMINECTOMY WITH COFLEX 1 LEVEL Bilateral 07/04/2014    Procedure: Laminectomy and Foraminotomy - bilateral - Lumbar Four-Five with coflex ;  Surgeon: Karie Chimera, MD;  Location: [place] NEURO ORS;  Service: Neurosurgery;  Laterality: Bilateral;   LUMBAR WOUND DEBRIDEMENT N/A 08/19/2014   Procedure: Exploration of Lumbar wound;  Surgeon: Karie Chimera, MD;  Location: [place] NEURO ORS;  Service: Neurosurgery;  Laterality: N/A;   OPEN REDUCTION INTERNAL FIXATION (ORIF) DISTAL RADIAL FRACTURE Right 07/18/2020   Procedure: OPEN REDUCTION INTERNAL FIXATION (ORIF) DISTAL RADIAL FRACTURE;  Surgeon: Hessie Knows, MD;  Location: [facility] ORS;  Service: Orthopedics;  Laterality: Right;   PERIPHERAL VASCULAR CATHETERIZATION Left 08/12/2015   Procedure: Lower Extremity Angiography;  Surgeon: Katha Cabal, MD;  Location: [place] CV LAB;  Service: Cardiovascular;  Laterality: Left;   TONSILLECTOMY     VENTRAL HERNIA REPAIR N/A 05/26/2015   Procedure: HERNIA REPAIR VENTRAL ADULT;  Surgeon: Christene Lye, MD;  Location: [facility] ORS;  Service: General;  Laterality: N/A;    Prior to Admission medications   Medication Sig Start Date End Date Taking? Authorizing Provider  albuterol (VENTOLIN HFA) 108 (90 Base) MCG/ACT inhaler Inhale 2 puffs into the lungs every 6 (six) hours as needed for wheezing or shortness of breath. 08/19/20  Yes Tally Joe T, FNP  clopidogrel (PLAVIX) 75 MG tablet Take 75 mg by mouth daily.   Yes [provider]  famotidine (PEPCID) 20 MG tablet Take 1 tablet (20 mg total) by mouth 2 (two) times daily. 06/20/20  Yes Carrie Mew, MD  FLUoxetine (PROZAC) 20 MG capsule Take 1 capsule (20 mg total) by mouth daily. 07/18/20  Yes Hessie Knows, MD  furosemide (LASIX) 20 MG tablet Take 1 tablet (20 mg total) by mouth daily as needed for fluid or edema. 02/15/20  Yes Lorella Nimrod, MD  gabapentin (NEURONTIN) 300 MG capsule Take 2 capsules (600 mg total) by mouth at bedtime. 08/18/20  Yes Gwyneth Sprout, FNP  HYDROcodone-acetaminophen (NORCO)  5-325 MG tablet Take 1-2 tablets by mouth 2 (two) times daily as needed for moderate pain. Do not fill within 30 days of previous refill. Thank you. 10/08/20  Yes Tally Joe T, FNP  lisinopril (ZESTRIL) 2.5 MG tablet TAKE 1 TABLET BY MOUTH EVERY DAY 02/29/20  Yes Mar Daring, PA-C  metoprolol tartrate (LOPRESSOR) 25 MG tablet Take 2 tablets (50 mg total) by mouth 2 (two) times daily. 07/18/20  Yes Hessie Knows, MD  nitroGLYCERIN (NITROSTAT) 0.4 MG SL tablet Place 1 tablet (0.4 mg total) under the tongue every 5 (five) minutes x 3 doses as needed for chest pain. 02/15/20  Yes Lorella Nimrod, MD  omeprazole (PRILOSEC) 40 MG capsule Take 1 capsule (40 mg total) by mouth daily. 07/18/20  Yes Hessie Knows, MD  ondansetron (ZOFRAN ODT) 4 MG disintegrating tablet Take 1 tablet (4  mg total) by mouth every 8 (eight) hours as needed for nausea or vomiting. 06/20/20  Yes Carrie Mew, MD  rivaroxaban (XARELTO) 20 MG TABS tablet Take 20 mg by mouth daily with supper.  08/27/14  Yes [provider]  rosuvastatin (CRESTOR) 20 MG tablet Take 1 tablet (20 mg total) by mouth daily. 02/15/20  Yes Lorella Nimrod, MD  traZODone (DESYREL) 100 MG tablet Take 0.5 tablets (50 mg total) by mouth at bedtime. 10/13/20  Yes Tally Joe T, FNP    Allergies Codeine, Penicillins, Latex, Adhesive [tape], and Sulfa antibiotics  Family History  Problem Relation Age of Onset   Alzheimer's disease Mother    Heart attack Father    Pancreatic cancer Sister    Healthy Brother     Social History Social History   Tobacco Use   Smoking status: Former    Packs/day: 1.00    Years: 47.00    Pack years: 47.00    Types: Cigarettes    Quit date: 08/19/2014    Years since quitting: 6.1   Smokeless tobacco: Former    Quit date: 08/19/2014  Vaping Use   Vaping Use: Never used  Substance Use Topics   Alcohol use: No   Drug use: No    Review of Systems Constitutional: No fever/chills but feeling very fatigued.   Also seemed a bit confused started a new sleep medicine she is only taken 1 tablet she took it on Monday and seemed confused throughout Tuesday Eyes: No visual changes. ENT: No neck pain Cardiovascular: Denies chest pain. Respiratory: Little bit of shortness of breath but she reports that intermittent and comes off and on currently not feeling short of breath.  No cough. Gastrointestinal: No abdominal pain.   Genitourinary: Negative for dysuria. Musculoskeletal: Negative for back pain. Skin: Negative for rash except her feet have looked somewhat purple. Neurological: Negative for headaches but per daughter having some intermittent confusion for the last couple of days perhaps almost for the last week.    ____________________________________________   PHYSICAL EXAM:  VITAL SIGNS: ED Triage Vitals  Enc Vitals Group     BP 10/15/20 1914 101/64     Pulse Rate 10/15/20 1914 72     Resp 10/15/20 1914 20     Temp 10/15/20 1914 98.3 F (36.8 C)     Temp Source 10/15/20 1914 Oral     SpO2 10/15/20 1914 (!) 77 %     Weight 10/15/20 1907 160 lb (72.6 kg)     Height 10/15/20 1907 5\' 5"  (1.651 m)     Head Circumference --      Peak Flow --      Pain Score 10/15/20 1905 0     Pain Loc --      Pain Edu? --      Excl. in [place]? --     Constitutional: Alert and oriented.  Ill-appearing in no acute extremitas but she appears ill pale generally fatigued Eyes: Conjunctivae are normal. Head: Atraumatic. Nose: No congestion/rhinnorhea. Mouth/Throat: Mucous membranes are moist. Neck: No stridor.  Cardiovascular: Normal rate irregular.  Grossly normal heart sounds.  Good peripheral circulation. Respiratory: Normal respiratory effort.  No retractions. Lungs CTAB.  No noted rales or crackles Gastrointestinal: Soft and nontender. No distention. Musculoskeletal: No lower extremity tenderness has trace edema in her feet bilateral.  Also slight edema and some bruising over the dorsal surface of the  right hand but range of motion is excellent throughout the hand forearm wrist and fingers.  Patient reports she thinks she struck it on something when she got weak and fell   Neurologic:  Normal speech and language. No gross focal neurologic deficits are appreciated.  Skin:  Skin is warm, dry and intact.  Patient has poor peripheral perfusion in all extremities.  She has very poor perfusion with slow capillary refill about 3 to 4 seconds in her hands bilaterally with a purpuric color, also noted in her left foot very slow capillary refill.  She does however have strong central pulses with easily palpable bilateral radial pulses and palpable dorsalis pedis pulses in the feet bilaterally.  The right foot also demonstrates notables slight shininess to the skin and erythema redness and warmth across the toes of the right foot and across the back heel.  Some cracking of the skin is noted in both feet as well Psychiatric: Mood and affect are normal. Speech and behavior are normal.  ____________________________________________   LABS (all labs ordered are listed, but only abnormal results are displayed)  Labs Reviewed  PROTIME-INR - Abnormal; Notable for the following components:      Result Value   Prothrombin Time 21.4 (*)    INR 1.9 (*)    All other components within normal limits  APTT - Abnormal; Notable for the following components:   aPTT 39 (*)    All other components within normal limits  CBC - Abnormal; Notable for the following components:   Hemoglobin 10.1 (*)    MCH 21.8 (*)    MCHC 27.0 (*)    RDW 17.1 (*)    All other components within normal limits  DIFFERENTIAL - Abnormal; Notable for the following components:   Neutro Abs 7.8 (*)    All other components within normal limits  COMPREHENSIVE METABOLIC PANEL - Abnormal; Notable for the following components:   CO2 35 (*)    Glucose, Bld 119 (*)    BUN 35 (*)    Creatinine, Ser 1.13 (*)    Calcium 8.6 (*)    Total Protein 5.9  (*)    GFR, Estimated 51 (*)    Anion gap 4 (*)    All other components within normal limits  BRAIN NATRIURETIC PEPTIDE - Abnormal; Notable for the following components:   B Natriuretic Peptide 967.2 (*)    All other components within normal limits  BLOOD GAS, VENOUS - Abnormal; Notable for the following components:   pCO2, Ven 85 (*)    Bicarbonate 38.1 (*)    Acid-Base Excess 8.7 (*)    All other components within normal limits  TROPONIN I (HIGH SENSITIVITY) - Abnormal; Notable for the following components:   Troponin I (High Sensitivity) 63 (*)    All other components within normal limits  TROPONIN I (HIGH SENSITIVITY) - Abnormal; Notable for the following components:   Troponin I (High Sensitivity) 73 (*)    All other components within normal limits  RESP PANEL BY RT-PCR (FLU A&B, COVID) ARPGX2  CULTURE, BLOOD (SINGLE)  URINE CULTURE  URINE CULTURE  LACTIC ACID, PLASMA  LACTIC ACID, PLASMA  PROCALCITONIN  URINALYSIS, COMPLETE (UACMP) WITH MICROSCOPIC  PROCALCITONIN  LACTIC ACID, PLASMA  LEGIONELLA PNEUMOPHILA SEROGP 1 UR AG  STREP PNEUMONIAE URINARY ANTIGEN  URINALYSIS, ROUTINE W REFLEX MICROSCOPIC  CBC  BASIC METABOLIC PANEL  MAGNESIUM  PHOSPHORUS  LACTIC ACID, PLASMA  CBG MONITORING, ED   ____________________________________________  EKG  Reviewed inter by me at 1903 Heart rate 80 QRS 69 QTc 430 Atrial fibrillation, no evidence of acute  ischemia.  Possible old anterior infarct ____________________________________________  RADIOLOGY  CT HEAD WO CONTRAST  Result Date: 10/15/2020 CLINICAL DATA:  Dizziness.  Nonspecific. EXAM: CT HEAD WITHOUT CONTRAST TECHNIQUE: Contiguous axial images were obtained from the base of the skull through the vertex without intravenous contrast. COMPARISON:  None. BRAIN: BRAIN Chronic left frontoparietal encephalomalacia again noted. No evidence of large-territorial acute infarction. No parenchymal hemorrhage. No mass lesion. No  extra-axial collection. No mass effect or midline shift. No hydrocephalus. Basilar cisterns are patent. Vascular: No hyperdense vessel. Atherosclerotic calcifications are present within the cavernous internal carotid arteries. Skull: No acute fracture or focal lesion. Sinuses/Orbits: Paranasal sinuses and mastoid air cells are clear. Bilateral lens replacement. Orbits are unremarkable. Other: None. IMPRESSION: No acute intracranial abnormality. Electronically Signed   By: Iven Finn M.D.   On: 10/15/2020 19:41   DG Hand 2 View Right  Result Date: 10/15/2020 CLINICAL DATA:  [place] today.  Swelling of the right hand. EXAM: RIGHT HAND - 2 VIEW COMPARISON:  Right forearm 07/11/2020 FINDINGS: Plate and screw fixation of an old fracture deformity of the distal right radial metaphysis. Fracture line remains present, most likely due to incomplete healing rather than new fracture. The surgical hardware appears intact. There is an ununited ossicle at the ulnar styloid process which is also old. Diffuse bone demineralization. No additional fractures are identified. Soft tissue swelling over the dorsum of the right hand and wrist. No radiopaque soft tissue foreign bodies or soft tissue gas. Degenerative changes in the interphalangeal joints and STT joints. IMPRESSION: Previous internal fixation of an old fracture of the distal right radial metaphysis. Fracture line remains present likely indicating incomplete union. 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 Degenerative changes. Dorsal soft tissue swelling. Electronically Signed   By: Lucienne Capers M.D.   On: 10/15/2020 20:57   DG Chest Port 1 View  Result Date: 10/15/2020 CLINICAL DATA:  Fall, dizziness EXAM: PORTABLE CHEST 1 VIEW COMPARISON:  06/20/2020 FINDINGS: Lung volumes are small and pulmonary insufflation has diminished when compared to prior examination. Interval development of mild bilateral perihilar interstitial pulmonary infiltrate, most  in keeping with interstitial pulmonary edema, as well as small bilateral pleural effusions, right greater than left. Cardiomegaly is stable. No pneumothorax. No acute bone abnormality. Bilateral calcified breast implants noted. IMPRESSION: Interval development of mild to moderate CHF.  Stable cardiomegaly Pulmonary hypoinflation Electronically Signed   By: Fidela Salisbury M.D.   On: 10/15/2020 20:45    CT head reviewed negative for acute finding.  Right hand x-ray reviewed probable old fracture with incomplete union.  No obvious acute fracture noted.  Chest x-ray reviewed, consistent with mild to moderate CHF. ____________________________________________   PROCEDURES  Procedure(s) performed: None  Procedures  Critical Care performed: Yes, see critical care note(s)  CRITICAL CARE Performed by: Delman Kitten   Total critical care time: 30 minutes  Critical care time was exclusive of separately billable procedures and treating other patients.  Critical care was necessary to treat or prevent imminent or life-threatening deterioration.  Critical care was time spent personally by me on the following activities: development of treatment plan with patient and/or surrogate as well as nursing, discussions with consultants, evaluation of patient's response to treatment, examination of patient, obtaining history from patient or surrogate, ordering and performing treatments and interventions, ordering and review of laboratory studies, ordering and review of radiographic studies, pulse oximetry and re-evaluation of patient's condition.   ____________________________________________   INITIAL IMPRESSION / ASSESSMENT AND PLAN /  ED COURSE  Pertinent labs & imaging results that were available during my care of the patient were reviewed by me and considered in my medical decision making (see chart for details).   Patient presents with weakness confusion and concerning findings that seem to be demonstrable  of possible shock or severely reduced peripheral circulation.  She demonstrates purpuric skin tone and poor capillary refill in all extremities with exception to the right foot which is red warm to touch and appears cellulitic.  This could be representative of early shock though she does not show clear evidence of endorgan failure at this point her laboratory testing is relatively reassuring.  However I am concerned about the possibility of cellulitis sepsis, also in addition possibly volume overload CHF she does have a mild oxygen requirement at this time.  She is atrial fibrillation, but rate controlled.  I discussed her case with Dr. Nehemiah Massed of cardiology at approximately 9:30 PM, reviewed her case clinical exam and history and he recommended we try a small fluid bolus and monitor to see if this shows improvement with regard to blood pressure peripheral perfusion I think this is agreeable.  I discussed with both the patient as well as her daughter at bedside to clarify her wishes, and daughter reports that she has a previous DNR.  Confirmed with the patient who is alert oriented and appears to have capacity and both she and her daughter understand and affirm that she has both DO NOT RESUSCITATE as well as DO NOT INTUBATE.  Antibiotics ordered, penicillin allergy noted.  Vancomycin aztreonam for treatment of cellulitis.  In addition patient seen by the ICU team, will be admitted to ICU for further care and management of.  At this point she does not require pressors does not appear to have obvious signs of septic shock but I am significant concerned that she is going to be extremely difficult to manage with regard to volume as well as resuscitative measures if she was outside the ICU setting.  Patient and daughter both agreeable to plan for admission.  Rufina Falco NP at bedside performing ICU evaluation with plan for ICU admit given clinical findings concerning for potential shock, also high risk for volume  overload, possible sepsis (though not currently meeting sepsis criteria). RR 20 but not greater than 20. HR > 90 present. No fever or hypothermia. Normal WBC. Suspect sepsis, but technically does not meet criteria noted at this point, but obviously high concern for possible sepsis or other acute critical illness such as potential for progressive CHF.       ____________________________________________   FINAL CLINICAL IMPRESSION(S) / ED DIAGNOSES  Final diagnoses:  Cellulitis of right foot  Shock circulatory ([place])        Note:  This document was prepared using Dragon voice recognition software and may include unintentional dictation errors       Delman Kitten, MD 10/15/20 2319

## 2020-10-15 NOTE — Consult Note (Signed)
PHARMACY -  BRIEF ANTIBIOTIC NOTE   Pharmacy has received consult(s) for vancomycin from an ED provider.  The patient's profile has been reviewed for ht/wt/allergies/indication/available labs.    One time order(s) placed for  --vancomycin 1500 mg x 1   Further antibiotics/pharmacy consults should be ordered by admitting physician if indicated.                       Thank you, Dorothe Pea, PharmD, BCPS Clinical Pharmacist   10/15/2020  9:51 PM

## 2020-10-15 NOTE — Progress Notes (Signed)
PHARMACY CONSULT NOTE - FOLLOW UP  Pharmacy Consult for Electrolyte Monitoring and Replacement   Recent Labs: Potassium (mmol/L)  Date Value  10/15/2020 4.2  11/10/2013 4.2   Magnesium (mg/dL)  Date Value  06/21/2020 1.8   Calcium (mg/dL)  Date Value  10/15/2020 8.6 (L)   Calcium, Total (mg/dL)  Date Value  11/10/2013 7.9 (L)   Albumin (g/dL)  Date Value  10/15/2020 3.5  04/04/2017 4.4  04/29/2012 2.7 (L)   Sodium (mmol/L)  Date Value  10/15/2020 139  08/13/2019 139  11/10/2013 141     Assessment: 10/12:  Ca = 8.6, Alb = 3.5,  Corrected Ca = 9.0  Goal of Therapy:  Electrolytes WNL   Plan:  No additional electrolytes needed at this time, will recheck electrolytes on 10/13 with AM labs.   Orene Desanctis ,PharmD Clinical Pharmacist 10/15/2020 10:14 PM

## 2020-10-15 NOTE — H&P (Signed)
NAME:  Kayla Maxwell, MRN:  [id number], DOB:  [birth year], LOS: 0 ADMISSION DATE:  10/15/2020, CONSULTATION DATE: 10/15/2020 REFERRING MD:  Delman Kitten MD  CHIEF COMPLAINT: Generalized weakness  HPI  75 y.o with significant PMH as below who presented to the ED with chief complaints of dizziness, generalized weakness and near syncopal episode.  Patient daughter at the bedside, patient has been complaining of generalized weakness, activity intolerance and imbalance for the past 1 week.  Daughter stated that this morning at around 945 when she was talking to her, patient sounded like she was slurring her speech and not acting her usual self.  EMS was called today for progressive weakness and on answering the door patient states she became dizzy and lost her balance falling on the ground.  She denies hitting her head or loss of consciousness.  Patient also complained of shortness of breath on route to the ED and was placed on O2 at 4 L.  Denies other associated symptoms of numbness or tingling, chest pain, fevers or chills, nausea or vomiting, diaphoresis or cough.  ED Course: On arrival to the ED, She was afebrile with blood pressure 101/64 mm Hg and pulse rate 74 beats/min. RR breaths per minute, oxygen saturation 77% on 4 L.  There were no focal neurological deficits; She was alert and oriented x4, but appeared pale and lethargic.  The right foot was noted to be erythematous and warm to touch concerning for possible cellulitis. Initial pertinent labs/Diagnostics WBC/Hgb/Hct/Plts:  9.7/10.1/37.4/270 (10/12 2026)  CO2 35, glucose 119, BUN 35, creatinine 1.13, calcium 8.6 Troponin:  63  BNP: 967.2 UA: Pending VBG:  pO2 32; pCO2 85; pH 7.26;  HCO3 38.1, %O2 Sat 51 Lactate: 1.0 EKG: unchanged from previous tracings, atrial fibrillation, rate 80. CXR: Interval development of mild to moderate CHF Other Imaging: CT head negative for acute intracranial abnormality. Given concerns for possible sepsis in  the setting of cellulitis and probable underlying pneumonia versus CHF exacerbation, patient received gentle fluid hydration and was started on broad-spectrum antibiotics with aztreonam due to penicillin allergy.  PCCM consulted for medical optimization.  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  Consults:  PCCM  Procedures:  None  Significant Diagnostic Tests:  10/12: Chest Xray>Interval development of mild to moderate CHF 10/12: Noncontrast CT head> no acute intracranial abnormality 10/12:  US venous lower bilateral>  Micro Data:  10/12: SARS-CoV-2 PCR> negative 10/12: Influenza PCR> negative 10/12: Blood culture x2> 10/12: Urine Culture> 10/12: MRSA PCR>>  10/12: Strep pneumo urinary antigen> 10/12: Legionella urinary antigen>  Antimicrobials:  Aztreonam 10/12>  OBJECTIVE  Blood pressure 102/72, pulse 89, temperature 98.3 F (36.8 C), temperature source Oral, resp. rate 20, height 5\' 5"  (1.651 m), weight 72.6 kg, SpO2 100 %.        Intake/Output Summary (Last 24 hours) at 10/15/2020 2238 Last data filed at 10/15/2020 2229 Gross per 24 hour  Intake 400 ml  Output --  Net 400 ml   Filed Weights   10/15/20 1907  Weight: 72.6 kg   Physical Examination  GENERAL:75 year-old critically ill patient lying in the bed with no acute distress.  EYES: Pupils equal, round, reactive to light and accommodation. No scleral icterus. Extraocular muscles intact.  HEENT: Head atraumatic, normocephalic. Oropharynx and nasopharynx clear.  NECK:  Supple,  no jugular venous distention. No thyroid enlargement, no tenderness.  LUNGS: Decreased breath sounds bilaterally, no wheezing,rhonchi or crepitation. Moderate rales bilaterally .No use of accessory muscles of respiration.  CARDIOVASCULAR: S1, S2 normal. Systolic murmurs, rubs, or gallops.  ABDOMEN: Soft, nontender, nondistended. Bowel sounds present. No organomegaly or mass.  EXTREMITIES: Bilateral pitting edema of lower extremities, cyanosis and discoloration.  Shiny, hyperpigmented skin, hair loss and ulceration of foot and toes with less than 3 seconds cap refill NEUROLOGIC: Cranial nerves II through XII are intact.  Muscle strength 5/5 in all extremities. Sensation impaired. Gait not checked.  PSYCHIATRIC: The patient is alert and oriented x 3.  SKIN: As noted below       Labs/imaging that I havepersonally reviewed  (right click and "Reselect all SmartList Selections" daily)     Labs   CBC: Recent Labs  Lab 10/15/20 2026  WBC 9.7  NEUTROABS 7.8*  HGB 10.1*  HCT 37.4  MCV 80.6  PLT 341    Basic Metabolic Panel: Recent Labs  Lab 10/15/20 2026  NA 139  K 4.2  CL 100  CO2 35*  GLUCOSE 119*  BUN 35*  CREATININE 1.13*  CALCIUM 8.6*   GFR: Estimated Creatinine Clearance: 42.9 mL/min (A) (by C-G formula based on SCr of 1.13 mg/dL (H)). Recent Labs  Lab 10/15/20 2026 10/15/20 2049  WBC 9.7  --   LATICACIDVEN  --  1.0    Liver Function Tests: Recent Labs  Lab 10/15/20 2026  AST 20  ALT 17  ALKPHOS 66  BILITOT 0.8  PROT 5.9*  ALBUMIN 3.5   No results for input(s): LIPASE, AMYLASE in the last 168 hours. No results for input(s): AMMONIA in the last 168 hours.  ABG    Component Value Date/Time   HCO3 38.1 (H) 10/15/2020 2210   O2SAT 51.0 10/15/2020 2210     Coagulation Profile: Recent Labs  Lab 10/15/20 2026  INR 1.9*    Cardiac Enzymes: No results for input(s): CKTOTAL, CKMB, CKMBINDEX, TROPONINI in the last 168 hours.  HbA1C: Hgb A1c MFr  Bld  Date/Time Value Ref Range Status  06/21/2020 08:39 AM 5.9 (H) 4.8 - 5.6 % Final    Comment:    (NOTE)         Prediabetes: 5.7 - 6.4         Diabetes: >6.4         Glycemic control for adults with diabetes: <7.0   04/04/2017 12:00 AM 6.0 (H) 4.8 - 5.6 % Final    Comment:             Prediabetes: 5.7 - 6.4          Diabetes: >6.4          Glycemic control for adults with diabetes: <7.0     CBG: No results for input(s): GLUCAP in the last 168 hours.  Review of Systems:   UNABLE TO OBTAIN, PATIENT IS A POOR HISTORIAN  Past Medical History  She,  has a past medical history of Acid reflux, Allergy, Anemia, Atrial fibrillation ([place]), Cancer ([place]) (AGE 42), CHF (congestive heart failure) ([place]), Chronic back pain, Complication of anesthesia, Constipation, Coronary artery disease, Difficult intubation, Dysrhythmia, Headache, History of blood transfusion, History of shingles, Hyperlipidemia, Hypertension, Lumbar surgical wound fluid collection, Sleep apnea, Stroke ([place]), Stroke ([place]) (06-04-15),  TIA (transient ischemic attack), and Urinary frequency.   Surgical History    Past Surgical History:  Procedure Laterality Date   ABDOMINAL HYSTERECTOMY     APPENDECTOMY  1963   BACK SURGERY  08/19/2014   Dr. Hal Neer ([place] [facility])   [place]  2014   Dr Jamal Collin   COLONOSCOPY     CORONARY STENT INTERVENTION N/A 02/13/2020   Procedure: CORONARY STENT INTERVENTION;  Surgeon: Yolonda Kida, MD;  Location: [facility];  Service: Cardiovascular;  Laterality: N/A;   ESOPHAGOGASTRODUODENOSCOPY     HAMMER TOE SURGERY Bilateral    LEFT HEART CATH AND CORONARY ANGIOGRAPHY N/A 02/13/2020   Procedure: LEFT HEART CATH AND CORONARY ANGIOGRAPHY and possible PCI and stent;  Surgeon: Yolonda Kida, MD;  Location: [facility];  Service: Cardiovascular;  Laterality: N/A;   LOWER EXTREMITY ANGIOGRAPHY Left  01/23/2019   Procedure: LOWER EXTREMITY ANGIOGRAPHY;  Surgeon: Katha Cabal, MD;  Location: [facility];  Service: Cardiovascular;  Laterality: Left;   LOWER EXTREMITY ANGIOGRAPHY Left 02/20/2019   Procedure: LOWER EXTREMITY ANGIOGRAPHY;  Surgeon: Katha Cabal, MD;  Location: [facility];  Service: Cardiovascular;  Laterality: Left;   LUMBAR LAMINECTOMY WITH COFLEX 1 LEVEL Bilateral 07/04/2014   Procedure: Laminectomy and Foraminotomy - bilateral - Lumbar Four-Five with coflex ;  Surgeon: Karie Chimera, MD;  Location: [facility];  Service: Neurosurgery;  Laterality: Bilateral;   LUMBAR WOUND DEBRIDEMENT N/A 08/19/2014   Procedure: Exploration of Lumbar wound;  Surgeon: Karie Chimera, MD;  Location: [facility];  Service: Neurosurgery;  Laterality: N/A;   OPEN REDUCTION INTERNAL FIXATION (ORIF) DISTAL RADIAL FRACTURE Right 07/18/2020   Procedure: OPEN REDUCTION INTERNAL FIXATION (ORIF) DISTAL RADIAL FRACTURE;  Surgeon: Hessie Knows, MD;  Location: [facility];  Service: Orthopedics;  Laterality: Right;   PERIPHERAL VASCULAR CATHETERIZATION Left 08/12/2015   Procedure: Lower Extremity Angiography;  Surgeon: Katha Cabal, MD;  Location: [facility];  Service: Cardiovascular;  Laterality: Left;   TONSILLECTOMY     VENTRAL HERNIA REPAIR N/A 05/26/2015   Procedure: HERNIA REPAIR VENTRAL ADULT;  Surgeon: Christene Lye, MD;  Location: [facility];  Service: General;  Laterality: N/A;     Social History   reports that she quit smoking about 6 years ago. Her smoking use included cigarettes. She has a 47.00 pack-year smoking history. She quit smokeless tobacco use about 6 years ago. She reports that she does not drink alcohol and does not use drugs.   Family History   Her family history includes Alzheimer's disease in her mother; Healthy in her brother; Heart attack in her father; Pancreatic cancer in her sister.   Allergies Allergies  Allergen Reactions    Codeine Other (See Comments)    Makes her feel "crazy"   Penicillins Swelling    Did it involve swelling of the face/tongue/throat, SOB, or low BP? Unknown Did it involve sudden or severe rash/hives, skin peeling, or any reaction on the inside of your mouth or nose? No Did you need to seek medical attention at a hospital or doctor's office? Unknown When did it last happen?      Childhood allergy If all above answers are "NO", may proceed with cephalosporin use.    Latex    Adhesive [Tape] Hives    (Latex)   Sulfa Antibiotics Nausea Only and Rash     Home  Medications  Prior to Admission medications   Medication Sig Start Date End Date Taking? Authorizing Provider  albuterol (VENTOLIN HFA) 108 (90 Base) MCG/ACT inhaler Inhale 2 puffs into the lungs every 6 (six) hours as needed for wheezing or shortness of breath. 08/19/20  Yes Tally Joe T, FNP  clopidogrel (PLAVIX) 75 MG tablet Take 75 mg by mouth daily.   Yes [provider]  famotidine (PEPCID) 20 MG tablet Take 1 tablet (20 mg total) by mouth 2 (two) times daily. 06/20/20  Yes Carrie Mew, MD  FLUoxetine (PROZAC) 20 MG capsule Take 1 capsule (20 mg total) by mouth daily. 07/18/20  Yes Hessie Knows, MD  furosemide (LASIX) 20 MG tablet Take 1 tablet (20 mg total) by mouth daily as needed for fluid or edema. 02/15/20  Yes Lorella Nimrod, MD  gabapentin (NEURONTIN) 300 MG capsule Take 2 capsules (600 mg total) by mouth at bedtime. 08/18/20  Yes Gwyneth Sprout, FNP  HYDROcodone-acetaminophen (NORCO) 5-325 MG tablet Take 1-2 tablets by mouth 2 (two) times daily as needed for moderate pain. Do not fill within 30 days of previous refill. Thank you. 10/08/20  Yes Tally Joe T, FNP  lisinopril (ZESTRIL) 2.5 MG tablet TAKE 1 TABLET BY MOUTH EVERY DAY 02/29/20  Yes Mar Daring, PA-C  metoprolol tartrate (LOPRESSOR) 25 MG tablet Take 2 tablets (50 mg total) by mouth 2 (two) times daily. 07/18/20  Yes Hessie Knows, MD   nitroGLYCERIN (NITROSTAT) 0.4 MG SL tablet Place 1 tablet (0.4 mg total) under the tongue every 5 (five) minutes x 3 doses as needed for chest pain. 02/15/20  Yes Lorella Nimrod, MD  omeprazole (PRILOSEC) 40 MG capsule Take 1 capsule (40 mg total) by mouth daily. 07/18/20  Yes Hessie Knows, MD  ondansetron (ZOFRAN ODT) 4 MG disintegrating tablet Take 1 tablet (4 mg total) by mouth every 8 (eight) hours as needed for nausea or vomiting. 06/20/20  Yes Carrie Mew, MD  rivaroxaban (XARELTO) 20 MG TABS tablet Take 20 mg by mouth daily with supper.  08/27/14  Yes [provider]  rosuvastatin (CRESTOR) 20 MG tablet Take 1 tablet (20 mg total) by mouth daily. 02/15/20  Yes Lorella Nimrod, MD  traZODone (DESYREL) 100 MG tablet Take 0.5 tablets (50 mg total) by mouth at bedtime. 10/13/20  Yes Gwyneth Sprout, FNP  Scheduled Meds:  Derrill Memo ON 10/16/2020] clopidogrel  75 mg Oral Daily   famotidine  20 mg Oral BID   [START ON 10/16/2020] FLUoxetine  20 mg Oral Daily   [START ON 10/16/2020] pantoprazole  40 mg Oral Daily   [START ON 10/16/2020] rivaroxaban  20 mg Oral Q supper   [START ON 10/16/2020] rosuvastatin  20 mg Oral Daily   Continuous Infusions:  aztreonam     vancomycin 1,500 mg (10/15/20 2307)   PRN Meds:.docusate sodium, nitroGLYCERIN, ondansetron, polyethylene glycol  Active Hospital Problem list    Assessment & Plan:  Acute Hypoxic Hypercapnic Respiratory Failure secondary to CHF exacerbation PMHx: OSA on CPAP -Supplemental O2 as needed to maintain O2 saturations 88 to 92% -BiPAP, wean as tolerated -Follow intermittent ABG and chest x-ray as needed -IV Lasix as blood pressure and renal function permits; currently on Lasix 20 mg IV BID -As needed bronchodilators  Sepsis without septic shock due to suspected cellulitis of right foot Lactic: 1.0, Baseline PCT: <0.10, UA: Pending, CXR: No evidence of pneumonia Initial interventions/workup included: 500cc NS &  aztreonam -Supplemental oxygen as needed, to maintain SpO2 > 90% -f/u  cultures, trend lactic/ PCT -monitor WBC/ fever curve -IV antibiotics: Aztreonam due to PCN allergy, de-escalate if work-up above negative -Gentle IVF hydration as needed -Consider vasopressors to maintain MAP> 65 -Strict I/O's: keep UOP > 0.5 mL/kg/hr   Acute on chronic systolic CHF NYHA class III: EF of 30% by echocardiogram in May 2022  -Hypertension Hx: CAD status post STEMI with PCI of LAD in 02/2020, HLD  -Continuous cardiac monitoring -Maintain MAP greater than 65 -IV Lasix as blood pressure and renal function permits; currently on Lasix 40 mg IV BID -Continue metoprolol as BP permits -Hold lisinopril in the setting of AKI -Cardiology consult -Repeat 2D Echocardiogram   Acute Metabolic Encephalopathy due to Hypercapnia -CT head negative for acute intracranial abnormality -Provide supportive care -BiPAP to treat Hypercapnia if indicated   Acute kidney injury  -Monitor I&O's / urinary output -Follow BMP -Ensure adequate renal perfusion -Avoid nephrotoxic agents as able -Replace electrolytes as indicated   Severe PAD and PVD Bilateral lower extremity neuropathy -Obtain US Venous Lower bilateral (DVT) -Patient's follows with Dr. Delana Meyer, requesting in-house evaluation  Diabetes mellitus -CBGs -Sliding scale insulin -Follow ICU hyper/hypoglycemia protocol -Hold home Meds   Best practice:  Diet:  Oral Pain/Anxiety/Delirium protocol (if indicated): No VAP protocol (if indicated): Not indicated DVT prophylaxis: Systemic AC GI prophylaxis: PPI Glucose control:  SSI Yes Central venous access:  N/A Arterial line:  N/A Foley:  N/A Mobility:  bed rest  PT consulted: N/A Last date of multidisciplinary goals of care discussion [10/12] Code Status:  DNR Disposition: Stepdown   = Goals of Care = Code Status Order: DNR  Primary Emergency Contact: Etheridge,[name], Home Phone: [phone] Wishes  to pursue full aggressive treatment and intervention options, however would not want  CPR/ACLS and intubation. Goals of care will be addressed on going with family if that should become necessary.  Critical care time: 45 minutes     Rufina Falco, DNP, CCRN, FNP-C, AGACNP-BC Acute Care Nurse Practitioner  [facility] Pager: [phone] Luis Llorens Torres at [facility]  .

## 2020-10-15 NOTE — ED Triage Notes (Signed)
Pt to ED via EMS from home c/o dizziness that started this morning.  States answered the door and became dizzy and lost her balance and fell, unsure if she hit her head but denies LOC.  States takes [place] in February, denies pain at this time.  States felt SOB earlier but placed on O2 4L by EMS and not SOB at this time.  Daughter states sounded slurred on the phone at [place] this morning but has been sounding slurred over last week and recently started on trazodone, last normal per daughter last week.  Hx of stroke without deficits per daughter.  Pt denies numbness or tingling, equal strength throughout extremities, PERRLA, A&Ox4.

## 2020-10-15 NOTE — Progress Notes (Signed)
PHARMACY -  BRIEF ANTIBIOTIC NOTE   Pharmacy has received consult(s) for Aztreonam from an ED provider.  The patient's profile has been reviewed for ht/wt/allergies/indication/available labs.    One time order(s) placed for Aztreonam 2 gm IV X 1  Further antibiotics/pharmacy consults should be ordered by admitting physician if indicated.                       Thank you, Aloha Bartok D 10/15/2020  10:44 PM

## 2020-10-16 ENCOUNTER — Inpatient Hospital Stay
Admit: 2020-10-16 | Discharge: 2020-10-16 | Disposition: A | Discharge status: Home / Self Care | Payer: Medicare Other | Attending: Nurse Practitioner | Admitting: Nurse Practitioner

## 2020-10-16 ENCOUNTER — Inpatient Hospital Stay: Payer: Medicare Other

## 2020-10-16 DIAGNOSIS — J9601 Acute respiratory failure with hypoxia: Secondary | ICD-10-CM

## 2020-10-16 LAB — PHOSPHORUS: Phosphorus: 3.2 mg/dL (ref 2.5–4.6)

## 2020-10-16 LAB — BASIC METABOLIC PANEL
Anion gap: 7 (ref 5–15)
BUN: 34 mg/dL — ABNORMAL HIGH (ref 8–23)
CO2: 33 mmol/L — ABNORMAL HIGH (ref 22–32)
Calcium: 8.5 mg/dL — ABNORMAL LOW (ref 8.9–10.3)
Chloride: 101 mmol/L (ref 98–111)
Creatinine, Ser: 1.07 mg/dL — ABNORMAL HIGH (ref 0.44–1.00)
GFR, Estimated: 54 mL/min — ABNORMAL LOW (ref >60)
Glucose, Bld: 125 mg/dL — ABNORMAL HIGH (ref 70–99)
Potassium: 3.9 mmol/L (ref 3.5–5.1)
Sodium: 141 mmol/L (ref 135–145)

## 2020-10-16 LAB — VITAMIN B12: Vitamin B-12: 128 pg/mL — ABNORMAL LOW (ref 180–914)

## 2020-10-16 LAB — IRON AND TIBC
Iron: 18 ug/dL — ABNORMAL LOW (ref 28–170)
Saturation Ratios: 4 % — ABNORMAL LOW (ref 10.4–31.8)
TIBC: 428 ug/dL (ref 250–450)
UIBC: 410 ug/dL

## 2020-10-16 LAB — CBC
HCT: 33 % — ABNORMAL LOW (ref 36.0–46.0)
Hemoglobin: 9.2 g/dL — ABNORMAL LOW (ref 12.0–15.0)
MCH: 22.3 pg — ABNORMAL LOW (ref 26.0–34.0)
MCHC: 27.9 g/dL — ABNORMAL LOW (ref 30.0–36.0)
MCV: 80.1 fL (ref 80.0–100.0)
Platelets: 235 10*3/uL (ref 150–400)
RBC: 4.12 MIL/uL (ref 3.87–5.11)
RDW: 16.9 % — ABNORMAL HIGH (ref 11.5–15.5)
WBC: 7.9 10*3/uL (ref 4.0–10.5)
nRBC: 0 % (ref 0.0–0.2)

## 2020-10-16 LAB — MAGNESIUM: Magnesium: 2 mg/dL (ref 1.7–2.4)

## 2020-10-16 LAB — URINALYSIS, COMPLETE (UACMP) WITH MICROSCOPIC
Bilirubin Urine: NEGATIVE
Glucose, UA: NEGATIVE mg/dL
Hgb urine dipstick: NEGATIVE
Ketones, ur: NEGATIVE mg/dL
Leukocytes,Ua: NEGATIVE
Nitrite: NEGATIVE
Protein, ur: 30 mg/dL — AB
Specific Gravity, Urine: 1.016 (ref 1.005–1.030)
pH: 5 (ref 5.0–8.0)

## 2020-10-16 LAB — ECHOCARDIOGRAM COMPLETE
AR max vel: 1.9 cm2
AV Area VTI: 1.89 cm2
AV Area mean vel: 1.88 cm2
AV Mean grad: 4 mmHg
AV Peak grad: 6.5 mmHg
Ao pk vel: 1.27 m/s
Area-P 1/2: 5.22 cm2
Height: 65 in
MV VTI: 1.51 cm2
S' Lateral: 2.98 cm
Weight: 2560 oz

## 2020-10-16 LAB — CBG MONITORING, ED
Glucose-Capillary: 176 mg/dL — ABNORMAL HIGH (ref 70–99)
Glucose-Capillary: 108 mg/dL — ABNORMAL HIGH (ref 70–99)

## 2020-10-16 LAB — TSH: TSH: 2.238 u[IU]/mL (ref 0.350–4.500)

## 2020-10-16 LAB — VITAMIN D 25 HYDROXY (VIT D DEFICIENCY, FRACTURES): Vit D, 25-Hydroxy: 25.94 ng/mL — ABNORMAL LOW (ref 30–100)

## 2020-10-16 LAB — STREP PNEUMONIAE URINARY ANTIGEN: Strep Pneumo Urinary Antigen: NEGATIVE

## 2020-10-16 LAB — FOLATE: Folate: 7.3 ng/mL (ref >5.9)

## 2020-10-16 LAB — LACTIC ACID, PLASMA: Lactic Acid, Venous: 1.3 mmol/L (ref 0.5–1.9)

## 2020-10-16 LAB — PROCALCITONIN: Procalcitonin: <0.1 ng/mL

## 2020-10-16 MED ORDER — ALBUTEROL SULFATE HFA 108 (90 BASE) MCG/ACT IN AERS: 2.0000 | INHALATION_SPRAY | Freq: Four times a day (QID) | RESPIRATORY_TRACT | Status: DC | PRN

## 2020-10-16 MED ORDER — CEFAZOLIN SODIUM-DEXTROSE 1-4 GM/50ML-% IV SOLN
1.0000 g | Freq: Three times a day (TID) | INTRAVENOUS | Status: DC
  Filled 2020-10-16 (×3): qty 50

## 2020-10-16 MED ORDER — ALBUTEROL SULFATE (2.5 MG/3ML) 0.083% IN NEBU
2.5000 mg | INHALATION_SOLUTION | Freq: Four times a day (QID) | RESPIRATORY_TRACT | Status: DC | PRN
  Administered 2020-10-17: 2.5 mg via RESPIRATORY_TRACT
  Filled 2020-10-16: qty 3

## 2020-10-16 MED ORDER — HYDROCODONE-ACETAMINOPHEN 5-325 MG PO TABS
1.0000 | ORAL_TABLET | Freq: Two times a day (BID) | ORAL | Status: DC | PRN
  Administered 2020-10-16: 2 via ORAL
  Administered 2020-10-18 (×2): 1 via ORAL
  Administered 2020-10-19 – 2020-10-22 (×4): 2 via ORAL
  Filled 2020-10-16: qty 1
  Filled 2020-10-16 (×4): qty 2
  Filled 2020-10-16: qty 1
  Filled 2020-10-16: qty 2

## 2020-10-16 MED ORDER — FUROSEMIDE 10 MG/ML IJ SOLN
40.0000 mg | Freq: Two times a day (BID) | INTRAMUSCULAR | Status: DC
  Administered 2020-10-16 – 2020-10-17 (×2): 40 mg via INTRAVENOUS
  Filled 2020-10-16 (×2): qty 4

## 2020-10-16 MED ORDER — GABAPENTIN 300 MG PO CAPS
600.0000 mg | ORAL_CAPSULE | Freq: Every day | ORAL | Status: DC
  Administered 2020-10-16 – 2020-10-19 (×4): 600 mg via ORAL
  Filled 2020-10-16 (×4): qty 2

## 2020-10-16 MED ORDER — METOPROLOL TARTRATE 25 MG PO TABS
12.5000 mg | ORAL_TABLET | Freq: Two times a day (BID) | ORAL | Status: DC
  Administered 2020-10-16 – 2020-10-23 (×10): 12.5 mg via ORAL
  Filled 2020-10-16 (×15): qty 1

## 2020-10-16 NOTE — ED Notes (Signed)
This RN to pt's bedside for low O2 alarm with poor pleth. Multiple changes in positioning and pulse ox monitor. Good O2 pleth with nose pulse ox. Pt's O2 sats at 72% on RA, pt. Placed on 7L [place] sats rebounded to 93%.. Dr. Val Riles notified will continue to monitor.

## 2020-10-16 NOTE — ED Notes (Signed)
[place] out of room to state they would come back d/t pt needing to use bathroom- pt assisted to bathroom with walker and assisted back to bed afterward

## 2020-10-16 NOTE — ED Notes (Signed)
Pt. Assisted up to bedside commode by this RN. Pt had removed her nasal cannula and pulse ox monitoring. This RN reminded pt. Why her 02 was important and notified her that she needed to keep both, nasal cannula, and pulse ox on for continued treatment/monitoring. Pt. Verbalized understanding and agreement.

## 2020-10-16 NOTE — ED Notes (Signed)
Pt given phone to call daughter 

## 2020-10-16 NOTE — ED Notes (Signed)
Pt repositioned in bed to eat and take meds- pt takes meds with applesauce

## 2020-10-16 NOTE — ED Notes (Signed)
This RN to bedside, pt. Has removed her nasal cannula and pulse ox monitoring. When placed back on pulse ox monitoring pt. Is sating 83%

## 2020-10-16 NOTE — ED Notes (Signed)
Pt up to bathroom with walker. NADN. Bedding changed

## 2020-10-16 NOTE — ED Notes (Addendum)
This RN to bedside for desat alarm, pt. Standing at side of bed, call light in reach, urinating on the bedside commode and on the floor. Pt. Cleaned up and helped back to bed. This RN rexplained to pt. That she had a purewick on and did not need to get up to the bedside commode, and that, for her safety, if she did need to get up, she should use the call light to call for help. Pt. Acknowledged that she knew all of this information. Pt. Reattached to cardiac monitoring, and situated in bed for comfort.

## 2020-10-16 NOTE — Progress Notes (Addendum)
[facility] Progress Note  Patient: Kayla Maxwell    GUR:[id number]  DOA: 10/15/2020     Date of Service: the patient was seen and examined on 10/16/2020  Chief Complaint  Patient presents with   Dizziness   Brief hospital course: 75 y.o with significant PMH as below who presented to the ED with chief complaints of dizziness, generalized weakness and near syncopal episode.   Patient daughter at the bedside, patient has been complaining of generalized weakness, activity intolerance and imbalance for the past 1 week.  Daughter stated that this morning at around 945 when she was talking to her, patient sounded like she was slurring her speech and not acting her usual self.  EMS was called today for progressive weakness and on answering the door patient states she became dizzy and lost her balance falling on the ground.  She denies hitting her head or loss of consciousness.  Patient also complained of shortness of breath on route to the ED and was placed on O2 at 4 L.  Denies other associated symptoms of numbness or tingling, chest pain, fevers or chills, nausea or vomiting, diaphoresis or cough.   ED Course: On arrival to the ED, She was afebrile with blood pressure 101/64 mm Hg and pulse rate 74 beats/min. RR breaths per minute, oxygen saturation 77% on 4 L.  There were no focal neurological deficits; She was alert and oriented x4, but appeared pale and lethargic.  The right foot was noted to be erythematous and warm to touch concerning for possible cellulitis. Initial pertinent labs/Diagnostics WBC/Hgb/Hct/Plts:  9.7/10.1/37.4/270 (10/12 2026)  CO2 35, glucose 119, BUN 35, creatinine 1.13, calcium 8.6 Troponin:  63  BNP: 967.2 UA: Pending VBG:  pO2 32; pCO2 85; pH 7.26;  HCO3 38.1, %O2 Sat 51 Lactate: 1.0 EKG: unchanged from previous tracings, atrial fibrillation, rate 80. CXR: Interval development of mild to moderate CHF Other Imaging: CT head negative for acute intracranial  abnormality. Given concerns for possible sepsis in the setting of cellulitis and probable underlying pneumonia versus CHF exacerbation, patient received gentle fluid hydration and was started on broad-spectrum antibiotics with aztreonam due to penicillin allergy.  PCCM consulted for medical optimization.   Assessment and Plan: Acute Hypoxic Hypercapnic Respiratory Failure secondary to CHF exacerbation PMHx: OSA on CPAP -Supplemental O2 as needed to maintain O2 saturations 88 to 92% -BiPAP, wean as tolerated -Follow intermittent ABG and chest x-ray as needed -IV Lasix as blood pressure and renal function permits; currently on Lasix 20 mg IV BID -As needed bronchodilators Respiratory failure resolved, currently patient is saturating well on room air   Sepsis without septic shock due to suspected cellulitis of right foot Lactic: 1.0, Baseline PCT: <0.10, UA: Pending, CXR: No evidence of pneumonia Initial interventions/workup included: 500cc NS & aztreonam -Supplemental oxygen as needed, to maintain SpO2 > 90% -f/u cultures, trend lactic/ PCT -monitor WBC/ fever curve -IV antibiotics: Aztreonam due to PCN allergy, de-escalate if work-up above negative -Gentle IVF hydration as needed -Consider vasopressors to maintain MAP> 65 -Strict I/O's: keep UOP > 0.5 mL/kg/hr   Acute on chronic systolic CHF NYHA class III: EF of 30% by echocardiogram in May 2022  -Hypertension Hx: CAD status post STEMI with PCI of LAD in 02/2020, HLD  -Continuous cardiac monitoring -Maintain MAP greater than 65 -IV Lasix as blood pressure and renal function permits; currently on Lasix 40 mg IV BID -Continue metoprolol as BP permits -Hold lisinopril in the setting of AKI -Cardiology consult -Repeat 2D  Echocardiogram   Acute Metabolic Encephalopathy due to Hypercapnia -CT head negative for acute intracranial abnormality -Provide supportive care -BiPAP to treat Hypercapnia if indicated   Acute kidney injury   -Monitor I&O's / urinary output -Follow BMP -Ensure adequate renal perfusion -Avoid nephrotoxic agents as able -Replace electrolytes as indicated   Severe PAD and PVD Bilateral lower extremity neuropathy -Obtain US Venous Lower bilateral (DVT) -Patient's follows with Dr. Delana Meyer, requesting in-house evaluation Follow ABI and arterial duplex as well   Diabetes mellitus -CBGs -Sliding scale insulin -Follow ICU hyper/hypoglycemia protocol -Hold home Meds   Body mass index is 26.63 kg/m.  Interventions:       Diet: Heart healthy DVT Prophylaxis: Therapeutic Anticoagulation with Xarelto    Advance goals of care discussion: DNR  Family Communication: family was present at bedside, at the time of interview.  The pt provided permission to discuss medical plan with the family. Opportunity was given to ask question and all questions were answered satisfactorily.   Disposition:  Pt is from Home, admitted with resp failure ad sepsis, Fruchter w/up pending, which precludes a safe discharge. Discharge to Home, when clinically stable, may require 1-2 more days.  Subjective: Patient was admitted overnight due to respiratory failure which has been resolved, currently saturating well, [place] patient complaining of bilateral lower extremity pain more in the left first and second toe.  Patient has severe PAD, work-up is pending.  We will continue empiric treatment for cellulitis as well. Patient denies any chest palpitations  Physical Exam: General:  alert oriented to time, place, and person.  Appear in mild distress, affect appropriate Eyes: PERRLA ENT: Oral Mucosa Clear, moist  Neck: no JVD,  Cardiovascular: S1 and S2 Present, no Murmur, irregular rhythm, A. fib on the monitor Respiratory: good respiratory effort, Bilateral Air entry equal and Decreased, mild Crackles, mild wheezes Abdomen: Bowel Sound present, Soft and no tenderness,  Skin: no rashes, chronic pigmentation on the right  hand possible bruise, Extremities: 2+ Pedal edema, no calf tenderness,  erythematous bilateral feet Neurologic: without any new focal findings Gait not checked due to patient safety concerns  Vitals:   10/16/20 1030 10/16/20 1230 10/16/20 1400 10/16/20 1530  BP: 107/61 94/71 (!) 118/58 100/86  Pulse:  (!) 108 (!) 106 (!) 111  Resp: (!) 23 18 (!) 24   Temp:      TempSrc:      SpO2:  94% 94%   Weight:      Height:        Intake/Output Summary (Last 24 hours) at 10/16/2020 1555 Last data filed at 10/15/2020 2229 Gross per 24 hour  Intake 400 ml  Output --  Net 400 ml   Filed Weights   10/15/20 1907  Weight: 72.6 kg    Data Reviewed: I have personally reviewed and interpreted daily labs, tele strips, imagings as discussed above. I reviewed all nursing notes, pharmacy notes, vitals, pertinent old records I have discussed plan of care as described above with RN and patient/family.  CBC: Recent Labs  Lab 10/15/20 2026 10/16/20 0646  WBC 9.7 7.9  NEUTROABS 7.8*  --   HGB 10.1* 9.2*  HCT 37.4 33.0*  MCV 80.6 80.1  PLT 270 740   Basic Metabolic Panel: Recent Labs  Lab 10/15/20 2026 10/16/20 0646  NA 139 141  K 4.2 3.9  CL 100 101  CO2 35* 33*  GLUCOSE 119* 125*  BUN 35* 34*  CREATININE 1.13* 1.07*  CALCIUM 8.6* 8.5*  MG  --  2.0  PHOS  --  3.2    Studies: CT HEAD WO CONTRAST  Result Date: 10/15/2020 CLINICAL DATA:  Dizziness.  Nonspecific. EXAM: CT HEAD WITHOUT CONTRAST TECHNIQUE: Contiguous axial images were obtained from the base of the skull through the vertex without intravenous contrast. COMPARISON:  None. BRAIN: BRAIN Chronic left frontoparietal encephalomalacia again noted. No evidence of large-territorial acute infarction. No parenchymal hemorrhage. No mass lesion. No extra-axial collection. No mass effect or midline shift. No hydrocephalus. Basilar cisterns are patent. Vascular: No hyperdense vessel. Atherosclerotic calcifications are present within  the cavernous internal carotid arteries. Skull: No acute fracture or focal lesion. Sinuses/Orbits: Paranasal sinuses and mastoid air cells are clear. Bilateral lens replacement. Orbits are unremarkable. Other: None. IMPRESSION: No acute intracranial abnormality. Electronically Signed   By: Iven Finn M.D.   On: 10/15/2020 19:41   DG Hand 2 View Right  Result Date: 10/15/2020 CLINICAL DATA:  [place] today.  Swelling of the right hand. EXAM: RIGHT HAND - 2 VIEW COMPARISON:  Right forearm 07/11/2020 FINDINGS: Plate and screw fixation of an old fracture deformity of the distal right radial metaphysis. Fracture line remains present, most likely due to incomplete healing rather than new fracture. The surgical hardware appears intact. There is an ununited ossicle at the ulnar styloid process which is also old. Diffuse bone demineralization. No additional fractures are identified. Soft tissue swelling over the dorsum of the right hand and wrist. No radiopaque soft tissue foreign bodies or soft tissue gas. Degenerative changes in the interphalangeal joints and STT joints. IMPRESSION: Previous internal fixation of an old fracture of the distal right radial metaphysis. Fracture line remains present likely indicating incomplete union. 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 Degenerative changes. Dorsal soft tissue swelling. Electronically Signed   By: Lucienne Capers M.D.   On: 10/15/2020 20:57   US Venous Img Lower Bilateral (DVT)  Result Date: 10/16/2020 CLINICAL DATA:  Cellulitis of right lower extremity L03.115 (ICD-10-CM) PVD (peripheral vascular disease) (HCC) I73.9 (ICD-10-CM) Redness and swelling of lower leg M79.89, R23.8 (ICD-10-CM) EXAM: BILATERAL LOWER EXTREMITY VENOUS DOPPLER ULTRASOUND TECHNIQUE: Gray-scale sonography with graded compression, as well as color Doppler and duplex ultrasound were performed to evaluate the lower extremity deep venous systems from the level of the common femoral  vein and including the common femoral, femoral, profunda femoral, popliteal and calf veins including the posterior tibial, peroneal and gastrocnemius veins when visible. The superficial great saphenous vein was also interrogated. Spectral Doppler was utilized to evaluate flow at rest and with distal augmentation maneuvers in the common femoral, femoral and popliteal veins. COMPARISON:  None. FINDINGS: Limitations: Difficult study due to uncontrollable patient motion. RIGHT LOWER EXTREMITY Common Femoral Vein: No evidence of thrombus. Normal compressibility, respiratory phasicity and response to augmentation. Saphenofemoral Junction: No evidence of thrombus. Normal compressibility and flow on color Doppler imaging. Profunda Femoral Vein: No evidence of thrombus. Normal compressibility and flow on color Doppler imaging. Femoral Vein: No evidence of thrombus. Normal compressibility, respiratory phasicity and response to augmentation. Popliteal Vein: No evidence of thrombus. Normal compressibility, respiratory phasicity and response to augmentation. Calf Veins: No evidence of thrombus. Normal compressibility and flow on color Doppler imaging. Superficial Great Saphenous Vein: No evidence of thrombus. Normal compressibility. LEFT LOWER EXTREMITY Common Femoral Vein: No evidence of thrombus. Normal compressibility, respiratory phasicity and response to augmentation. Saphenofemoral Junction: No evidence of thrombus. Normal compressibility and flow on color Doppler imaging. Profunda Femoral Vein: No evidence of thrombus. Normal compressibility and flow on  color Doppler imaging. Femoral Vein: No evidence of thrombus. Normal compressibility, respiratory phasicity and response to augmentation. Popliteal Vein: No evidence of thrombus. Normal compressibility, respiratory phasicity and response to augmentation. Calf Veins: No evidence of thrombus. Normal compressibility and flow on color Doppler imaging. Superficial Great  Saphenous Vein: No evidence of thrombus. Normal compressibility. IMPRESSION: No evidence of deep venous thrombosis in either lower extremity. Electronically Signed   By: Margaretha Sheffield M.D.   On: 10/16/2020 10:38   DG Chest Port 1 View  Result Date: 10/16/2020 CLINICAL DATA:  CHF EXAM: PORTABLE CHEST 1 VIEW COMPARISON:  10/15/2020 FINDINGS: Cardiomegaly and vascular pedicle widening with hazy density at the bases from pleural fluid and atelectasis at least. Congestive appearance of hilar vessels. Diffuse atheromatous calcification of the aorta. No pneumothorax. IMPRESSION: Continued congestion/edema with pleural effusions and presumed atelectasis at the bases. Electronically Signed   By: Jorje Guild M.D.   On: 10/16/2020 04:39   DG Chest Port 1 View  Result Date: 10/15/2020 CLINICAL DATA:  Fall, dizziness EXAM: PORTABLE CHEST 1 VIEW COMPARISON:  06/20/2020 FINDINGS: Lung volumes are small and pulmonary insufflation has diminished when compared to prior examination. Interval development of mild bilateral perihilar interstitial pulmonary infiltrate, most in keeping with interstitial pulmonary edema, as well as small bilateral pleural effusions, right greater than left. Cardiomegaly is stable. No pneumothorax. No acute bone abnormality. Bilateral calcified breast implants noted. IMPRESSION: Interval development of mild to moderate CHF.  Stable cardiomegaly Pulmonary hypoinflation Electronically Signed   By: Fidela Salisbury M.D.   On: 10/15/2020 20:45   ECHOCARDIOGRAM COMPLETE  Result Date: 10/16/2020    ECHOCARDIOGRAM REPORT   Patient Name:   Kayla Maxwell Date of Exam: 10/16/2020 Medical Rec #:  [id number]      Height:       65.0 in Accession #:    [id number]     Weight:       160.0 lb Date of Birth:  [birth year]      BSA:          1.799 m Patient Age:    18 years       BP:           103/73 mmHg Patient Gender: F              HR:           94 bpm. Exam Location:  [facility] Procedure: 2D Echo, Color  Doppler and Cardiac Doppler Indications:     I50.21 congestive heart failure-Acute Systolic  History:         Patient has prior history of Echocardiogram examinations, most                  recent 02/12/2020. CHF, CAD, Signs/Symptoms:Shortness of Breath                  and Edema; Risk Factors:Hypertension, Dyslipidemia and Sleep                  Apnea.  Sonographer:     Charmayne Sheer Referring Phys:  [place] Diagnosing Phys: Isaias Cowman MD IMPRESSIONS  1. Left ventricular ejection fraction, by estimation, is 45 to 50%. The left ventricle has mildly decreased function. The left ventricle has no regional wall motion abnormalities. Left ventricular diastolic parameters are indeterminate.  2. Right ventricular systolic function is normal. The right ventricular size is normal.  3. A small pericardial effusion is present.  4. The mitral valve is  normal in structure. Mild mitral valve regurgitation. No evidence of mitral stenosis.  5. The aortic valve is normal in structure. Aortic valve regurgitation is not visualized. No aortic stenosis is present.  6. The inferior vena cava is normal in size with greater than 50% respiratory variability, suggesting right atrial pressure of 3 mmHg. FINDINGS  Left Ventricle: Left ventricular ejection fraction, by estimation, is 45 to 50%. The left ventricle has mildly decreased function. The left ventricle has no regional wall motion abnormalities. The left ventricular internal cavity size was normal in size. There is no left ventricular hypertrophy. Left ventricular diastolic parameters are indeterminate. Right Ventricle: The right ventricular size is normal. No increase in right ventricular wall thickness. Right ventricular systolic function is normal. Left Atrium: Left atrial size was normal in size. Right Atrium: Right atrial size was normal in size. Pericardium: A small pericardial effusion is present. Mitral Valve: The mitral valve is normal in structure.  Mild mitral valve regurgitation. No evidence of mitral valve stenosis. MV peak gradient, 4.8 mmHg. The mean mitral valve gradient is 2.0 mmHg. Tricuspid Valve: The tricuspid valve is normal in structure. Tricuspid valve regurgitation is mild . No evidence of tricuspid stenosis. Aortic Valve: The aortic valve is normal in structure. Aortic valve regurgitation is not visualized. No aortic stenosis is present. Aortic valve mean gradient measures 4.0 mmHg. Aortic valve peak gradient measures 6.5 mmHg. Aortic valve area, by VTI measures 1.89 cm. Pulmonic Valve: The pulmonic valve was normal in structure. Pulmonic valve regurgitation is not visualized. No evidence of pulmonic stenosis. Aorta: The aortic root is normal in size and structure. Venous: The inferior vena cava is normal in size with greater than 50% respiratory variability, suggesting right atrial pressure of 3 mmHg. IAS/Shunts: No atrial level shunt detected by color flow Doppler.  LEFT VENTRICLE PLAX 2D LVIDd:         4.50 cm   Diastology LVIDs:         2.98 cm   LV e' medial:    7.40 cm/s LV PW:         1.24 cm   LV E/e' medial:  14.3 LV IVS:        0.75 cm   LV e' lateral:   6.74 cm/s LVOT diam:     1.90 cm   LV E/e' lateral: 15.7 LV SV:         44 LV SV Index:   24 LVOT Area:     2.84 cm  RIGHT VENTRICLE RV Basal diam:  3.59 cm LEFT ATRIUM           Index        RIGHT ATRIUM           Index LA diam:      5.90 cm 3.28 cm/m   RA Area:     22.00 cm LA Vol (A2C): 41.5 ml 23.07 ml/m  RA Volume:   58.70 ml  32.63 ml/m LA Vol (A4C): 84.1 ml 46.74 ml/m  AORTIC VALVE                    PULMONIC VALVE AV Area (Vmax):    1.90 cm     PV Vmax:       0.66 m/s AV Area (Vmean):   1.88 cm     PV Vmean:      44.600 cm/s AV Area (VTI):     1.89 cm     PV VTI:  0.102 m AV Vmax:           127.00 cm/s  PV Peak grad:  1.7 mmHg AV Vmean:          88.200 cm/s  PV Mean grad:  1.0 mmHg AV VTI:            0.231 m AV Peak Grad:      6.5 mmHg AV Mean Grad:      4.0 mmHg  LVOT Vmax:         84.90 cm/s LVOT Vmean:        58.500 cm/s LVOT VTI:          0.154 m LVOT/AV VTI ratio: 0.67  AORTA Ao Root diam: 2.80 cm MITRAL VALVE                TRICUSPID VALVE MV Area (PHT): 5.22 cm     TR Peak grad:   29.8 mmHg MV Area VTI:   1.51 cm     TR Vmax:        273.00 cm/s MV Peak grad:  4.8 mmHg MV Mean grad:  2.0 mmHg     SHUNTS MV Vmax:       1.09 m/s     Systemic VTI:  0.15 m MV Vmean:      53.9 cm/s    Systemic Diam: 1.90 cm MV Decel Time: 145 msec MV E velocity: 105.67 cm/s Isaias Cowman MD Electronically signed by Isaias Cowman MD Signature Date/Time: 10/16/2020/1:32:50 PM    Final     Scheduled Meds:  budesonide (PULMICORT) nebulizer solution  0.25 mg Nebulization BID   clopidogrel  75 mg Oral Daily   famotidine  20 mg Oral BID   FLUoxetine  20 mg Oral Daily   furosemide  20 mg Intravenous BID   ipratropium-albuterol  3 mL Nebulization Q6H   pantoprazole  40 mg Oral Daily   rivaroxaban  20 mg Oral Q supper   rosuvastatin  20 mg Oral Daily   Continuous Infusions: PRN Meds: docusate sodium, HYDROcodone-acetaminophen, nitroGLYCERIN, ondansetron, polyethylene glycol  Time spent: 35 minutes  Author: Val Riles. MD [facility] 10/16/2020 3:55 PM  To reach On-call, see care teams to locate the attending and reach out to them via [email or web address]. If 7PM-7AM, please contact night-coverage If you [place] have difficulty reaching the attending provider, please page the [facility] (Director on Call) for [facility] on amion for assistance.

## 2020-10-16 NOTE — Progress Notes (Signed)
*  PRELIMINARY RESULTS* Echocardiogram 2D Echocardiogram has been performed.  Kayla Maxwell 10/16/2020, 10:06 AM

## 2020-10-16 NOTE — ED Notes (Signed)
Pt. Requested her dinner warmed so she could eat it. Pt. Ate approx. 30% of her meal. 29mL water consumed. Hassan Rowan, NP requested pt. Be placed on biPAP a little while after her meal so as to avoid nausea/vomiting.

## 2020-10-16 NOTE — ED Notes (Signed)
US at bedside

## 2020-10-16 NOTE — ED Notes (Signed)
Daughter out of room and states pt is sleeping and that she is leaving and would be back in the AM- daughter asked for a call if pt got a room

## 2020-10-16 NOTE — ED Notes (Signed)
Meal given to pt. Family at bedside

## 2020-10-16 NOTE — Care Management Important Message (Signed)
Important Message  Patient Details  Name: Kayla Maxwell MRN: [id number] Date of Birth: 1945-06-19   Medicare Important Message Given:  N/A - LOS <3 / Initial given by admissions     Dannette Barbara 10/16/2020, 4:49 PM

## 2020-10-16 NOTE — ED Notes (Signed)
Pt taken for U/S

## 2020-10-16 NOTE — ED Notes (Signed)
Pt passed swallow screening.

## 2020-10-16 NOTE — ED Notes (Signed)
Pt. Arrived to Rm 35

## 2020-10-17 DIAGNOSIS — J9601 Acute respiratory failure with hypoxia: Secondary | ICD-10-CM | POA: Diagnosis not present

## 2020-10-17 LAB — BASIC METABOLIC PANEL
Anion gap: 8 (ref 5–15)
BUN: 25 mg/dL — ABNORMAL HIGH (ref 8–23)
CO2: 36 mmol/L — ABNORMAL HIGH (ref 22–32)
Calcium: 8.3 mg/dL — ABNORMAL LOW (ref 8.9–10.3)
Chloride: 96 mmol/L — ABNORMAL LOW (ref 98–111)
Creatinine, Ser: 1.11 mg/dL — ABNORMAL HIGH (ref 0.44–1.00)
GFR, Estimated: 52 mL/min — ABNORMAL LOW (ref >60)
Glucose, Bld: 100 mg/dL — ABNORMAL HIGH (ref 70–99)
Potassium: 3.5 mmol/L (ref 3.5–5.1)
Sodium: 140 mmol/L (ref 135–145)

## 2020-10-17 LAB — CBC
HCT: 35.3 % — ABNORMAL LOW (ref 36.0–46.0)
Hemoglobin: 9.3 g/dL — ABNORMAL LOW (ref 12.0–15.0)
MCH: 21 pg — ABNORMAL LOW (ref 26.0–34.0)
MCHC: 26.3 g/dL — ABNORMAL LOW (ref 30.0–36.0)
MCV: 79.9 fL — ABNORMAL LOW (ref 80.0–100.0)
Platelets: 212 10*3/uL (ref 150–400)
RBC: 4.42 MIL/uL (ref 3.87–5.11)
RDW: 16.8 % — ABNORMAL HIGH (ref 11.5–15.5)
WBC: 7.1 10*3/uL (ref 4.0–10.5)
nRBC: 0 % (ref 0.0–0.2)

## 2020-10-17 LAB — LEGIONELLA PNEUMOPHILA SEROGP 1 UR AG: L. pneumophila Serogp 1 Ur Ag: NEGATIVE

## 2020-10-17 LAB — PHOSPHORUS: Phosphorus: 3.9 mg/dL (ref 2.5–4.6)

## 2020-10-17 LAB — URINE CULTURE: Culture: NO GROWTH

## 2020-10-17 LAB — MAGNESIUM: Magnesium: 1.8 mg/dL (ref 1.7–2.4)

## 2020-10-17 LAB — PROCALCITONIN: Procalcitonin: <0.1 ng/mL

## 2020-10-17 MED ORDER — DILTIAZEM HCL 60 MG PO TABS
30.0000 mg | ORAL_TABLET | Freq: Once | ORAL | Status: AC
  Administered 2020-10-17: 30 mg via ORAL
  Filled 2020-10-17: qty 1

## 2020-10-17 MED ORDER — SODIUM CHLORIDE 0.9 % IV SOLN
200.0000 mg | Freq: Every day | INTRAVENOUS | Status: AC
  Administered 2020-10-17 – 2020-10-21 (×5): 200 mg via INTRAVENOUS
  Filled 2020-10-17 (×5): qty 200

## 2020-10-17 MED ORDER — POLYSACCHARIDE IRON COMPLEX 150 MG PO CAPS
150.0000 mg | ORAL_CAPSULE | Freq: Every day | ORAL | Status: DC
  Administered 2020-10-22 – 2020-10-23 (×2): 150 mg via ORAL
  Filled 2020-10-17 (×2): qty 1

## 2020-10-17 MED ORDER — FUROSEMIDE 10 MG/ML IJ SOLN
20.0000 mg | Freq: Two times a day (BID) | INTRAMUSCULAR | Status: DC
  Administered 2020-10-19 – 2020-10-23 (×7): 20 mg via INTRAVENOUS
  Filled 2020-10-17 (×11): qty 2

## 2020-10-17 MED ORDER — VITAMIN D (ERGOCALCIFEROL) 1.25 MG (50000 UNIT) PO CAPS
50000.0000 [IU] | ORAL_CAPSULE | ORAL | Status: DC
  Administered 2020-10-17: 50000 [IU] via ORAL
  Filled 2020-10-17: qty 1

## 2020-10-17 MED ORDER — FOLIC ACID 1 MG PO TABS
1.0000 mg | ORAL_TABLET | Freq: Every day | ORAL | Status: DC
  Administered 2020-10-17 – 2020-10-23 (×7): 1 mg via ORAL
  Filled 2020-10-17 (×7): qty 1

## 2020-10-17 MED ORDER — POTASSIUM CHLORIDE CRYS ER 20 MEQ PO TBCR
40.0000 meq | EXTENDED_RELEASE_TABLET | Freq: Once | ORAL | Status: AC
  Administered 2020-10-17: 40 meq via ORAL
  Filled 2020-10-17: qty 2

## 2020-10-17 MED ORDER — MAGNESIUM OXIDE -MG SUPPLEMENT 400 (240 MG) MG PO TABS
400.0000 mg | ORAL_TABLET | Freq: Two times a day (BID) | ORAL | Status: AC
  Administered 2020-10-17 – 2020-10-18 (×3): 400 mg via ORAL
  Filled 2020-10-17 (×3): qty 1

## 2020-10-17 MED ORDER — FLUTICASONE FUROATE-VILANTEROL 200-25 MCG/INH IN AEPB
1.0000 | INHALATION_SPRAY | Freq: Every day | RESPIRATORY_TRACT | Status: DC
  Administered 2020-10-17 – 2020-10-23 (×7): 1 via RESPIRATORY_TRACT
  Filled 2020-10-17: qty 28

## 2020-10-17 MED ORDER — PREDNISONE 20 MG PO TABS
40.0000 mg | ORAL_TABLET | Freq: Every day | ORAL | Status: AC
  Administered 2020-10-17 – 2020-10-19 (×3): 40 mg via ORAL
  Filled 2020-10-17 (×3): qty 2

## 2020-10-17 MED ORDER — CYANOCOBALAMIN 1000 MCG/ML IJ SOLN
1000.0000 ug | Freq: Every day | INTRAMUSCULAR | Status: AC
  Administered 2020-10-17 – 2020-10-23 (×7): 1000 ug via INTRAMUSCULAR
  Filled 2020-10-17 (×8): qty 1

## 2020-10-17 MED ORDER — VITAMIN B-12 1000 MCG PO TABS: 500.0000 ug | ORAL_TABLET | Freq: Every day | ORAL | Status: DC

## 2020-10-17 NOTE — ED Notes (Signed)
Informed RN bed assigned 

## 2020-10-17 NOTE — Progress Notes (Signed)
[facility] Progress Note  Patient: Kayla Maxwell    FKC:[id number]  DOA: 10/15/2020     Date of Service: the patient was seen and examined on 10/17/2020  Chief Complaint  Patient presents with   Dizziness   Brief hospital course: 75 y.o with significant PMH as below who presented to the ED with chief complaints of dizziness, generalized weakness and near syncopal episode.   Patient daughter at the bedside, patient has been complaining of generalized weakness, activity intolerance and imbalance for the past 1 week.  Daughter stated that this morning at around 945 when she was talking to her, patient sounded like she was slurring her speech and not acting her usual self.  EMS was called today for progressive weakness and on answering the door patient states she became dizzy and lost her balance falling on the ground.  She denies hitting her head or loss of consciousness.  Patient also complained of shortness of breath on route to the ED and was placed on O2 at 4 L.  Denies other associated symptoms of numbness or tingling, chest pain, fevers or chills, nausea or vomiting, diaphoresis or cough.   ED Course: On arrival to the ED, She was afebrile with blood pressure 101/64 mm Hg and pulse rate 74 beats/min. RR breaths per minute, oxygen saturation 77% on 4 L.  There were no focal neurological deficits; She was alert and oriented x4, but appeared pale and lethargic.  The right foot was noted to be erythematous and warm to touch concerning for possible cellulitis. Initial pertinent labs/Diagnostics WBC/Hgb/Hct/Plts:  9.7/10.1/37.4/270 (10/12 2026)  CO2 35, glucose 119, BUN 35, creatinine 1.13, calcium 8.6 Troponin:  63  BNP: 967.2 UA: Pending VBG:  pO2 32; pCO2 85; pH 7.26;  HCO3 38.1, %O2 Sat 51 Lactate: 1.0 EKG: unchanged from previous tracings, atrial fibrillation, rate 80. CXR: Interval development of mild to moderate CHF Other Imaging: CT head negative for acute intracranial  abnormality. Given concerns for possible sepsis in the setting of cellulitis and probable underlying pneumonia versus CHF exacerbation, patient received gentle fluid hydration and was started on broad-spectrum antibiotics with aztreonam due to penicillin allergy.  PCCM consulted for medical optimization.   Assessment and Plan: Acute Hypoxic Hypercapnic Respiratory Failure secondary to COPD and CHF exacerbation PMHx: OSA on CPAP -Supplemental O2 as needed to maintain O2 saturations 88 to 92% -BiPAP, wean as tolerated -Follow intermittent ABG and chest x-ray as needed -IV Lasix as blood pressure and renal function permits; currently on Lasix 20 mg IV BID -As needed bronchodilators 10/14 started Breo Ellipta inhaler and prednisone 40 mg p.o. daily for 3 days Patient is Rueger hypoxic and wheezing, requiring 4L oxygen via nasal cannula   Erythema of bilateral feet, does not look cellulitis Sepsis ruled out  lactic: 1.0, Baseline PCT: <0.10, UA: Negative, CXR: No evidence of pneumonia, Pro-Cal negative Initial interventions/workup included: 500cc NS & aztreonam -Supplemental oxygen as needed, to maintain SpO2 > 90% -cultures negative, trend lactic/ PCT -monitor WBC/ fever curve -s/p IV antibiotics: Aztreonam due to PCN allergy, discontinued -Gentle IVF hydration as needed -Consider vasopressors to maintain MAP> 65 -Strict I/O's: keep UOP > 0.5 mL/kg/hr    Acute on chronic systolic CHF NYHA class III: EF of 30% by echocardiogram in May 2022  -Hypertension Hx: CAD status post STEMI with PCI of LAD in 02/2020, HLD  -Continuous cardiac monitoring -Maintain MAP greater than 65 -s/p Lasix IV 40 twice daily, decrease to 20 twice daily on 10/14 -Continue  metoprolol as BP permits -Hold lisinopril in the setting of AKI -Cardiology consult Repeat TTE shows LVEF 45 to 09%    Acute Metabolic Encephalopathy due to Hypercapnia -CT head negative for acute intracranial abnormality -Provide  supportive care -BiPAP to treat Hypercapnia if indicated   Acute kidney injury  -Monitor I&O's / urinary output -Follow BMP -Ensure adequate renal perfusion -Avoid nephrotoxic agents as able -Replace electrolytes as indicated   H/o Severe PAD and PVD Bilateral lower extremity neuropathy - US Venous Lower bilateral negative for DVT, ABI normal -Patient's follows with Dr. Delana Meyer, requesting in-house evaluation Neuropathy could be secondary to vitamin B12 deficiency  Vitamin B12 deficiency, vitamin B12 level 128, started vitamin B12 1000 mcg IM injection daily for 7 days followed by oral supplement.  Follow with PCP for further management as an outpatient  Iron deficiency, iron saturation 4%, started Venofer IV during hospital stay, followed by oral supplement on discharge.  Vitamin D insufficiency, started vitamin D 50,000 units p.o. weekly.  Follow with PCP for further management outpatient  Folic acid level 7.3, at lower end, started folate supplement to prevent deficiency.    Diabetes mellitus -CBGs -Sliding scale insulin -Follow ICU hyper/hypoglycemia protocol -Hold home Meds   Body mass index is 26.63 kg/m.  Interventions:       Diet: Heart healthy DVT Prophylaxis: Therapeutic Anticoagulation with Xarelto    Advance goals of care discussion: DNR  Family Communication: family was present at bedside, at the time of interview.  The pt provided permission to discuss medical plan with the family. Opportunity was given to ask question and all questions were answered satisfactorily.   Disposition:  Pt is from Home, admitted with resp failure ad sepsis, Day w/up pending, which precludes a safe discharge. Discharge to Home, when clinically stable, may require 1-2 more days.  Subjective: Patient Kayla Maxwell has shortness of breath, currently on supplemental O2 inhalation via nasal cannula on 4 L, overall she feels improvement, denied any chest pain or palpitations, no any  other active issues.    Physical Exam: General:  alert oriented to time, place, and person.  Appear in mild distress, affect appropriate Eyes: PERRLA ENT: Oral Mucosa Clear, moist  Neck: no JVD,  Cardiovascular: S1 and S2 Present, no Murmur, irregular rhythm, A. fib on the monitor Respiratory: Mild bibasilar crackles, mild wheezing appreciated.   Abdomen: Bowel Sound present, Soft and no tenderness,  Skin: no rashes, chronic pigmentation on the right hand possible bruise, Extremities: 1-2 + Pedal edema, no calf tenderness,  erythematous bilateral feet Neurologic: without any new focal findings Gait not checked due to patient safety concerns  Vitals:   10/17/20 1339 10/17/20 1340 10/17/20 1349 10/17/20 1440  BP:   106/65 124/77  Pulse: (!) 120 (!) 112 (!) 120 66  Resp: (!) 21 (!) 27 19 (!) 24  Temp:    97.9 F (36.6 C)  TempSrc:      SpO2: 93% 92% 96% 94%  Weight:      Height:        Intake/Output Summary (Last 24 hours) at 10/17/2020 1601 Last data filed at 10/17/2020 1500 Gross per 24 hour  Intake 111.18 ml  Output --  Net 111.18 ml   Filed Weights   10/15/20 1907  Weight: 72.6 kg    Data Reviewed: I have personally reviewed and interpreted daily labs, tele strips, imagings as discussed above. I reviewed all nursing notes, pharmacy notes, vitals, pertinent old records I have discussed plan of care as  described above with RN and patient/family.  CBC: Recent Labs  Lab 10/15/20 2026 10/16/20 0646 10/17/20 0622  WBC 9.7 7.9 7.1  NEUTROABS 7.8*  --   --   HGB 10.1* 9.2* 9.3*  HCT 37.4 33.0* 35.3*  MCV 80.6 80.1 79.9*  PLT 270 235 353   Basic Metabolic Panel: Recent Labs  Lab 10/15/20 2026 10/16/20 0646 10/17/20 0622  NA 139 141 140  K 4.2 3.9 3.5  CL 100 101 96*  CO2 35* 33* 36*  GLUCOSE 119* 125* 100*  BUN 35* 34* 25*  CREATININE 1.13* 1.07* 1.11*  CALCIUM 8.6* 8.5* 8.3*  MG  --  2.0 1.8  PHOS  --  3.2 3.9    Studies: US ARTERIAL ABI  (SCREENING LOWER EXTREMITY)  Result Date: 10/16/2020 CLINICAL DATA:  Peripheral arterial disease. History of bilateral common iliac artery stents. EXAM: NONINVASIVE PHYSIOLOGIC VASCULAR STUDY OF BILATERAL LOWER EXTREMITIES TECHNIQUE: Evaluation of both lower extremities were performed at rest, including calculation of ankle-brachial indices with single level Doppler, pressure and pulse volume recording. COMPARISON:  CT 06/20/2020 FINDINGS: Right ABI:  1.21 Left ABI:  1.09 Right Lower Extremity: Irregularity with some triphasic Doppler waveforms. Left Lower Extremity: Irregularity with some triphasic Doppler waveforms. 1.0-1.4 Normal IMPRESSION: Normal resting ankle-brachial indices. Electronically Signed   By: Markus Daft M.D.   On: 10/16/2020 16:49    Scheduled Meds:  clopidogrel  75 mg Oral Daily   cyanocobalamin  1,000 mcg Intramuscular Daily   Followed by   Derrill Memo ON 10/24/2020] vitamin B-12  500 mcg Oral Daily   famotidine  20 mg Oral BID   FLUoxetine  20 mg Oral Daily   fluticasone furoate-vilanterol  1 puff Inhalation Daily   folic acid  1 mg Oral Daily   [START ON 10/18/2020] furosemide  20 mg Intravenous BID   gabapentin  600 mg Oral QHS   ipratropium-albuterol  3 mL Nebulization Q6H   [START ON 10/22/2020] iron polysaccharides  150 mg Oral Daily   magnesium oxide  400 mg Oral BID   metoprolol tartrate  12.5 mg Oral BID   pantoprazole  40 mg Oral Daily   predniSONE  40 mg Oral Q breakfast   rivaroxaban  20 mg Oral Q supper   rosuvastatin  20 mg Oral Daily   Vitamin D (Ergocalciferol)  50,000 Units Oral Q7 days   Continuous Infusions:  iron sucrose Stopped (10/17/20 1159)   PRN Meds: albuterol, docusate sodium, HYDROcodone-acetaminophen, nitroGLYCERIN, ondansetron, polyethylene glycol  Time spent: 35 minutes  Author: Val Riles. MD [facility] 10/17/2020 4:01 PM  To reach On-call, see care teams to locate the attending and reach out to them via [email or web address]. If  7PM-7AM, please contact night-coverage If you [place] have difficulty reaching the attending provider, please page the [facility] (Director on Call) for [facility] on amion for assistance.

## 2020-10-17 NOTE — Consult Note (Signed)
   Heart Failure Nurse Navigator Note  HFmrEF 45-50% right ventricular systolic function is normal.  Mild mitral regurgitation.    She presented to the emergency room with complaints of generalized weakness, and confusion.  Comorbidities:  Anemia Atrial fibrillation Coronary artery disease Hyperlipidemia Hypertension Obstructive sleep apnea with CPAP use  Medications:  Crestor 20 mg daily Metoprolol tartrate 12 and half milligrams twice daily Furosemide 40 mg IV 2 times a day Magnesium oxide 400 mg twice a day  Labs:  Sodium 140, potassium 3.5, chloride 96, CO2 36, BUN 25, creatinine 1.11, BNP on admission 967, lactic acid 1.3, phosphorus 3.2, magnesium 2, procalcitonin less than 0.10 Blood pressure is 120/73.   Met with patient and daughter in the emergency room.  Patient is awake and alert and oriented.  Spoke mostly with daughter as to how her mom takes care of her self at home, daughter states that she lives 1 house away from her and she does have home health come in Monday Wednesday Friday for 4 hours.  That she gets Meals on Wheels and she has talked with them about making sure that she does not get any meals that her salt has been added.  She does not use salt at the table.  She weighs herself daily, they had noted a 10 pound weight gain over the last couple of months.  Daughter states that her mother has had trouble with sleeping and had seen a physician and was ordered trazodone 50 mg at at bedtime.  But she had only given her 25 mg and did get 6 hours of sleep.  Discussed follow-up in the outpatient heart failure clinic, she has an appointment for October 21 at 1:30 in the afternoon.  They had no further questions.  Pricilla Riffle RN [facility]

## 2020-10-17 NOTE — ED Notes (Signed)
RN to bedside for Bipap alarm. Patient removed bipap and attempting to get out of bed. Attempted to reorient patient and put bipap back on. Patient refusing bipap. Patient placed on 4LNC, sats 97-99%. Patient back in bed, bed alarm placed under patient and on. Purewick reapplied.

## 2020-10-18 DIAGNOSIS — I509 Heart failure, unspecified: Secondary | ICD-10-CM

## 2020-10-18 DIAGNOSIS — I2510 Atherosclerotic heart disease of native coronary artery without angina pectoris: Secondary | ICD-10-CM

## 2020-10-18 DIAGNOSIS — J441 Chronic obstructive pulmonary disease with (acute) exacerbation: Secondary | ICD-10-CM | POA: Diagnosis not present

## 2020-10-18 DIAGNOSIS — G9341 Metabolic encephalopathy: Secondary | ICD-10-CM

## 2020-10-18 DIAGNOSIS — J9601 Acute respiratory failure with hypoxia: Secondary | ICD-10-CM | POA: Diagnosis not present

## 2020-10-18 DIAGNOSIS — I10 Essential (primary) hypertension: Secondary | ICD-10-CM

## 2020-10-18 LAB — BLOOD GAS, VENOUS
Acid-Base Excess: 13.5 mmol/L — ABNORMAL HIGH (ref 0.0–2.0)
Bicarbonate: 39.1 mmol/L — ABNORMAL HIGH (ref 20.0–28.0)
Delivery systems: POSITIVE
FIO2: 0.5
O2 Saturation: 99.8 %
Patient temperature: 37
pCO2, Ven: 55 mmHg (ref 44.0–60.0)
pH, Ven: 7.46 — ABNORMAL HIGH (ref 7.250–7.430)
pO2, Ven: 219 mmHg — ABNORMAL HIGH (ref 32.0–45.0)

## 2020-10-18 LAB — CBC
HCT: 32.5 % — ABNORMAL LOW (ref 36.0–46.0)
Hemoglobin: 9 g/dL — ABNORMAL LOW (ref 12.0–15.0)
MCH: 21.8 pg — ABNORMAL LOW (ref 26.0–34.0)
MCHC: 27.7 g/dL — ABNORMAL LOW (ref 30.0–36.0)
MCV: 78.9 fL — ABNORMAL LOW (ref 80.0–100.0)
Platelets: 181 10*3/uL (ref 150–400)
RBC: 4.12 MIL/uL (ref 3.87–5.11)
RDW: 17 % — ABNORMAL HIGH (ref 11.5–15.5)
WBC: 7.2 10*3/uL (ref 4.0–10.5)
nRBC: 0.3 % — ABNORMAL HIGH (ref 0.0–0.2)

## 2020-10-18 LAB — BASIC METABOLIC PANEL
Anion gap: 5 (ref 5–15)
BUN: 22 mg/dL (ref 8–23)
CO2: 36 mmol/L — ABNORMAL HIGH (ref 22–32)
Calcium: 8.6 mg/dL — ABNORMAL LOW (ref 8.9–10.3)
Chloride: 98 mmol/L (ref 98–111)
Creatinine, Ser: 0.97 mg/dL (ref 0.44–1.00)
GFR, Estimated: >60 mL/min (ref >60)
Glucose, Bld: 122 mg/dL — ABNORMAL HIGH (ref 70–99)
Potassium: 4.3 mmol/L (ref 3.5–5.1)
Sodium: 139 mmol/L (ref 135–145)

## 2020-10-18 LAB — PHOSPHORUS: Phosphorus: 2.4 mg/dL — ABNORMAL LOW (ref 2.5–4.6)

## 2020-10-18 LAB — MAGNESIUM: Magnesium: 1.8 mg/dL (ref 1.7–2.4)

## 2020-10-18 MED ORDER — HALOPERIDOL LACTATE 5 MG/ML IJ SOLN
2.0000 mg | Freq: Once | INTRAMUSCULAR | Status: AC
  Administered 2020-10-18: 2 mg via INTRAVENOUS
  Filled 2020-10-18: qty 1

## 2020-10-18 NOTE — Progress Notes (Signed)
Pt restless, confused.  Multiple attempts to get out of bed and take off bipap.  Pt able to be redirected and then later forgets and is impulsive.  NP, Rachael Fee on call [facility] notified.  One time order given, see MAR.  Will continue to redirect as needed to help pt tolerate bipap.

## 2020-10-18 NOTE — Progress Notes (Signed)
Mobility Specialist - Progress Note   10/18/20 1459  Mobility  Activity Contraindicated/medical hold  Mobility performed by Mobility specialist    Per discussion with RN, request to hold mobility at this time. Pt with elevated resting HR---150 bpm. Will attempt session another date/time as medically appropriate.    Kathee Delton Mobility Specialist 10/18/20, 3:00 PM

## 2020-10-18 NOTE — Evaluation (Signed)
Occupational Therapy Evaluation Patient Details Name: Kayla Maxwell MRN: [id number] DOB: March 12, 1945 Today's Date: 10/18/2020   History of Present Illness 75 y.o with significant PMH H/o Severe PAD and PVD,  Bilateral lower extremity neuropathy, and DM who presented to the ED with chief complaints of dizziness, generalized weakness and near syncopal episode. Pt being tx'ed for acute respiratory failure with COPD and CHF exacerbation.   Clinical Impression   Pt seen for OT evaluation this date in setting of acute hospitalization d/t COPD/CHF exacerbation. Pt reports living alone and being INDEP at baseline with support from her daughter and friends for transportation and grocery deliveries. Pt presents this date with decreased strength and decreased fxl activity tolerance. She currently requires: SETUP for seated UB ADLs, MIN/MOD A for seated LB ADLs, MIN A for ADL transfers and demos poor balance wtih attempted fxl mobility d/t consistent knee buckling despite UE support. OT will continue to follow acutely and recommend f/u in SNF setting upon d/c.      Recommendations for follow up therapy are one component of a multi-disciplinary discharge planning process, led by the attending physician.  Recommendations may be updated based on patient status, additional functional criteria and insurance authorization.   Follow Up Recommendations  SNF    Equipment Recommendations  Other (comment) (defer)    Recommendations for Other Services       Precautions / Restrictions Precautions Precautions: Fall Restrictions Weight Bearing Restrictions: No      Mobility Bed Mobility               General bed mobility comments: up to chair pre/post    Transfers Overall transfer level: Needs assistance Equipment used: Rolling walker (2 wheeled) Transfers: Sit to/from Stand Sit to Stand: Min assist         General transfer comment: assist for safety including cues for hand placement, knees  buckle even in static standing.    Balance Overall balance assessment: Needs assistance Sitting-balance support: Feet supported Sitting balance-Leahy Scale: Fair     Standing balance support: Bilateral upper extremity supported;During functional activity Standing balance-Leahy Scale: Poor Standing balance comment: knee buckling                           ADL either performed or assessed with clinical judgement   ADL                                         General ADL Comments: requires SETUP for seated UB ADLs, MIN/MOD A for seated LB ADLs, MIN A for ADL transfers and demos poor balance wtih attempted fxl mobility d/t consistent knee buckling despite UE support.     Vision Patient Visual Report: No change from baseline       Perception     Praxis      Pertinent Vitals/Pain Pain Assessment: No/denies pain     Hand Dominance Right   Extremity/Trunk Assessment Upper Extremity Assessment Upper Extremity Assessment: Generalized weakness   Lower Extremity Assessment Lower Extremity Assessment: Generalized weakness (knee buckling)   Cervical / Trunk Assessment Cervical / Trunk Assessment: Kyphotic   Communication Communication Communication: No difficulties   Cognition Arousal/Alertness: Awake/alert Behavior During Therapy: Restless;Anxious Overall Cognitive Status: No family/caregiver present to determine baseline cognitive functioning  General Comments: some decreased safety aareness, difficulty following higher level or multi-step commands. Oriented to self, month/year and place, but not situation. requires increased processing time   General Comments       Exercises Other Exercises Other Exercises: OT ed re: role of OT, safety considerations, use of call light, alerted patient to chair alarm   Shoulder Instructions      Home Living Family/patient expects to be discharged to:: Private  residence Living Arrangements: Alone Available Help at Discharge: Available PRN/intermittently;Family Type of Home: House Home Access: Stairs to enter [employer] of Steps: threshold step Entrance Stairs-Rails: Right Home Layout: One level               Home Equipment: [place] - 2 wheels;Cane - single point          Prior Functioning/Environment Level of Independence: Independent        Comments: Pt reports being independent without AD, no falls, doesn't drive. Daughter lives 2 houses down & can assist PRN. History taken from prior admission.        OT Problem List: Decreased strength;Decreased activity tolerance;Impaired balance (sitting and/or standing);Decreased safety awareness      OT Treatment/Interventions: Self-care/ADL training;Therapeutic exercise;DME and/or AE instruction;Therapeutic activities    OT Goals(Current goals can be found in the care plan section) Acute Rehab OT Goals Patient Stated Goal: none stated OT Goal Formulation: With patient Time For Goal Achievement: 11/01/20 Potential to Achieve Goals: Good ADL Goals Pt Will Perform Lower Body Dressing: with min guard assist Pt Will Transfer to Toilet: with min guard assist Pt Will Perform Toileting - Clothing Manipulation and hygiene: with min guard assist  OT Frequency: Min 1X/week   Barriers to D/C:            Co-evaluation              AM-PAC OT "6 Clicks" Daily Activity     Outcome Measure Help from another person eating meals?: None Help from another person taking care of personal grooming?: A Little Help from another person toileting, which includes using toliet, bedpan, or urinal?: A Little Help from another person bathing (including washing, rinsing, drying)?: A Lot Help from another person to put on and taking off regular upper body clothing?: None Help from another person to put on and taking off regular lower body clothing?: A Lot 6 Click Score: 18   End of  Session Equipment Utilized During Treatment: Gait belt;Rolling walker Nurse Communication: Mobility status  Activity Tolerance: Patient tolerated treatment well Patient left: in chair;with call bell/phone within reach;with chair alarm set  OT Visit Diagnosis: Unsteadiness on feet (R26.81);Muscle weakness (generalized) (M62.81);History of falling (Z91.81)                Time: 0160-1093 OT Time Calculation (min): 19 min Charges:  OT General Charges $OT Visit: 1 Visit OT Evaluation $OT Eval Moderate Complexity: 1 Mod OT Treatments $Self Care/Home Management : 8-22 mins  Gerrianne Scale, MS, OTR/L ascom [phone] 10/18/20, 5:06 PM

## 2020-10-18 NOTE — Progress Notes (Signed)
PROGRESS NOTE    Kayla Maxwell  NIO:[id number] DOB: 1945/01/15 DOA: 10/15/2020 PCP: Gwyneth Sprout, FNP   Brief Narrative:  On admission: "75 y.o with significant PMH as below who presented to the ED with chief complaints of dizziness, generalized weakness and near syncopal episode.  Patient daughter at the bedside, patient has been complaining of generalized weakness, activity intolerance and imbalance for the past 1 week.  Daughter stated that this morning at around 945 when she was talking to her, patient sounded like she was slurring her speech and not acting her usual self.  EMS was called today for progressive weakness and on answering the door patient states she became dizzy and lost her balance falling on the ground.  She denies hitting her head or loss of consciousness.  Patient also complained of shortness of breath on route to the ED and was placed on O2 at 4 L.  Denies other associated symptoms of numbness or tingling, chest pain, fevers or chills, nausea or vomiting, diaphoresis or cough."   ED Course: On arrival to the ED, She was afebrile with blood pressure 101/64 mm Hg and pulse rate 74 beats/min. RR breaths per minute, oxygen saturation 77% on 4 L.  There were no focal neurological deficits; She was alert and oriented x4, but appeared pale and lethargic.  The right foot was noted to be erythematous and warm to touch concerning for possible cellulitis. Initial pertinent labs/Diagnostics WBC/Hgb/Hct/Plts:  9.7/10.1/37.4/270 (10/12 2026)  CO2 35, glucose 119, BUN 35, creatinine 1.13, calcium 8.6 Troponin:  63  BNP: 967.2 UA: Pending VBG:  pO2 32; pCO2 85; pH 7.26;  HCO3 38.1, %O2 Sat 51 Lactate: 1.0 EKG: unchanged from previous tracings, atrial fibrillation, rate 80. CXR: Interval development of mild to moderate CHF Other Imaging: CT head negative for acute intracranial abnormality. Given concerns for possible sepsis in the setting of cellulitis and probable underlying  pneumonia versus CHF exacerbation, patient received gentle fluid hydration and was started on broad-spectrum antibiotics with aztreonam due to penicillin allergy.  PCCM consulted for medical optimization   Assessment & Plan:   Active Problems:   Acute respiratory failure with hypoxia (HCC)   Acute Hypoxic Hypercapnic Respiratory Failure secondary to COPD and CHF exacerbation PMHx: OSA on CPAP -Supplemental O2 as needed to maintain O2 saturations 88 to 92% -BiPAP, wean as tolerated -Follow intermittent ABG and chest x-ray as needed -IV Lasix as blood pressure and renal function permits; currently on Lasix 20 mg IV BID -As needed bronchodilators 10/14 started Breo Ellipta inhaler and prednisone 40 mg p.o. daily for 3 days Patient is [place] hypoxic and wheezing, requiring 4L oxygen via nasal cannula     Erythema of bilateral feet, does not look cellulitis As previously noted: Sepsis ruled out  lactic: 1.0, Baseline PCT: <0.10, UA: Negative, CXR: No evidence of pneumonia, Pro-Cal negative Initial interventions/workup included: 500cc NS & aztreonam -Supplemental oxygen as needed, to maintain SpO2 > 90% -cultures negative, trend lactic/ PCT -monitor WBC/ fever curve -s/p IV antibiotics: Aztreonam due to PCN allergy, discontinued -Gentle IVF hydration as needed -Consider vasopressors to maintain MAP> 65 -Strict I/O's: keep UOP > 0.5 mL/kg/hr     Acute on chronic systolic CHF NYHA class III: EF of 30% by echocardiogram in May 2022  -Hypertension Hx: CAD status post STEMI with PCI of LAD in 02/2020, HLD  -Continuous cardiac monitoring -Maintain MAP greater than 65 -s/p Lasix IV 40 twice daily, decrease to 20 twice daily on 10/14 -Continue metoprolol  as BP permits -Hold lisinopril in the setting of AKI -Cardiology consult Repeat TTE shows LVEF 45 to 50%     Acute Metabolic Encephalopathy due to Hypercapnia -CT head negative for acute intracranial abnormality -Provide supportive  care -BiPAP to treat Hypercapnia if indicated   Acute kidney injury  -Monitor I&O's / urinary output -Follow BMP -Ensure adequate renal perfusion -Avoid nephrotoxic agents as able -Replace electrolytes as indicated   H/o Severe PAD and PVD Bilateral lower extremity neuropathy - US Venous Lower bilateral negative for DVT, ABI normal -Patient's follows with Dr. Delana Meyer, requesting in-house evaluation Neuropathy could be secondary to vitamin B12 deficiency   Vitamin B12 deficiency, vitamin B12 level 128, started vitamin B12 1000 mcg IM injection daily for 7 days followed by oral supplement.  Follow with PCP for further management as an outpatient   Iron deficiency, iron saturation 4%, started Venofer IV during hospital stay, followed by oral supplement on discharge.   Vitamin D insufficiency, started vitamin D 50,000 units p.o. weekly.  Follow with PCP for further management outpatient   Folic acid level 7.3, at lower end, started folate supplement to prevent deficiency.       Diabetes mellitus -CBGs -Sliding scale insulin -Follow ICU hyper/hypoglycemia protocol -Hold home Meds     Body mass index is 26.63 kg/m.  Interventions:  DVT prophylaxis: [id number]   Code Status: DNR    Code Status Orders  (From admission, onward)           Start     Ordered   10/15/20 2239  Do not attempt resuscitation (DNR)  Continuous       Question Answer Comment  In the event of cardiac or respiratory ARREST Do not call a "code blue"   In the event of cardiac or respiratory ARREST Do not perform Intubation, CPR, defibrillation or ACLS   In the event of cardiac or respiratory ARREST Use medication by any route, position, wound care, and other measures to relive pain and suffering. May use oxygen, suction and manual treatment of airway obstruction as needed for comfort.      10/15/20 2238           Code Status History     Date Active Date Inactive Code Status Order ID Comments  User Context   10/15/2020 2209 10/15/2020 2238 Full Code [id number]  Lang Snow, NP ED   06/21/2020 0125 06/24/2020 1705 DNR [id number]  Sidney Ace, Arvella Merles, MD ED   02/12/2020 1341 02/15/2020 1808 Full Code [id number]  Vernelle Emerald, MD ED   02/20/2019 1558 02/21/2019 0014 Full Code [id number]  Schnier, Dolores Lory, MD Inpatient   01/23/2019 0957 01/23/2019 1926 Full Code [id number]  Schnier, Dolores Lory, MD Inpatient   08/16/2016 1944 08/17/2016 1755 Full Code [id number]  Max Sane, MD Inpatient   08/12/2015 1226 08/12/2015 1710 Full Code [id number]  Delana Meyer, Dolores Lory, MD Inpatient   06/06/2015 1310 06/08/2015 1317 Full Code [id number]  Vaughan Basta, MD Inpatient   08/19/2014 1129 08/29/2014 1825 Full Code [id number]  Karie Chimera, MD Inpatient   07/04/2014 1310 07/05/2014 1743 Full Code [id number]  Karie Chimera, MD Inpatient      Family Communication: None at bedside today Disposition Plan:    Patient not yet medically stable for discharge requiring IV diuresis, aggressive pulmonary support and IV management of CHF. Consults called: None Admission status: Inpatient   Consultants:  None  Procedures:  CT HEAD WO CONTRAST  Result Date: 10/15/2020 CLINICAL DATA:  Dizziness.  Nonspecific.  EXAM: CT HEAD WITHOUT CONTRAST TECHNIQUE: Contiguous axial images were obtained from the base of the skull through the vertex without intravenous contrast. COMPARISON:  None. BRAIN: BRAIN Chronic left frontoparietal encephalomalacia again noted. No evidence of large-territorial acute infarction. No parenchymal hemorrhage. No mass lesion. No extra-axial collection. No mass effect or midline shift. No hydrocephalus. Basilar cisterns are patent. Vascular: No hyperdense vessel. Atherosclerotic calcifications are present within the cavernous internal carotid arteries. Skull: No acute fracture or focal lesion. Sinuses/Orbits: Paranasal sinuses and mastoid air cells are clear. Bilateral lens replacement. Orbits are  unremarkable. Other: None. IMPRESSION: No acute intracranial abnormality. Electronically Signed   By: Iven Finn M.D.   On: 10/15/2020 19:41   DG Hand 2 View Right  Result Date: 10/15/2020 CLINICAL DATA:  [place] today.  Swelling of the right hand. EXAM: RIGHT HAND - 2 VIEW COMPARISON:  Right forearm 07/11/2020 FINDINGS: Plate and screw fixation of an old fracture deformity of the distal right radial metaphysis. Fracture line remains present, most likely due to incomplete healing rather than new fracture. The surgical hardware appears intact. There is an ununited ossicle at the ulnar styloid process which is also old. Diffuse bone demineralization. No additional fractures are identified. Soft tissue swelling over the dorsum of the right hand and wrist. No radiopaque soft tissue foreign bodies or soft tissue gas. Degenerative changes in the interphalangeal joints and STT joints. IMPRESSION: Previous internal fixation of an old fracture of the distal right radial metaphysis. Fracture line remains present likely indicating incomplete union. 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 Degenerative changes. Dorsal soft tissue swelling. Electronically Signed   By: Lucienne Capers M.D.   On: 10/15/2020 20:57   US Venous Img Lower Bilateral (DVT)  Result Date: 10/16/2020 CLINICAL DATA:  Cellulitis of right lower extremity L03.115 (ICD-10-CM) PVD (peripheral vascular disease) (HCC) I73.9 (ICD-10-CM) Redness and swelling of lower leg M79.89, R23.8 (ICD-10-CM) EXAM: BILATERAL LOWER EXTREMITY VENOUS DOPPLER ULTRASOUND TECHNIQUE: Gray-scale sonography with graded compression, as well as color Doppler and duplex ultrasound were performed to evaluate the lower extremity deep venous systems from the level of the common femoral vein and including the common femoral, femoral, profunda femoral, popliteal and calf veins including the posterior tibial, peroneal and gastrocnemius veins when visible. The superficial  great saphenous vein was also interrogated. Spectral Doppler was utilized to evaluate flow at rest and with distal augmentation maneuvers in the common femoral, femoral and popliteal veins. COMPARISON:  None. FINDINGS: Limitations: Difficult study due to uncontrollable patient motion. RIGHT LOWER EXTREMITY Common Femoral Vein: No evidence of thrombus. Normal compressibility, respiratory phasicity and response to augmentation. Saphenofemoral Junction: No evidence of thrombus. Normal compressibility and flow on color Doppler imaging. Profunda Femoral Vein: No evidence of thrombus. Normal compressibility and flow on color Doppler imaging. Femoral Vein: No evidence of thrombus. Normal compressibility, respiratory phasicity and response to augmentation. Popliteal Vein: No evidence of thrombus. Normal compressibility, respiratory phasicity and response to augmentation. Calf Veins: No evidence of thrombus. Normal compressibility and flow on color Doppler imaging. Superficial Great Saphenous Vein: No evidence of thrombus. Normal compressibility. LEFT LOWER EXTREMITY Common Femoral Vein: No evidence of thrombus. Normal compressibility, respiratory phasicity and response to augmentation. Saphenofemoral Junction: No evidence of thrombus. Normal compressibility and flow on color Doppler imaging. Profunda Femoral Vein: No evidence of thrombus. Normal compressibility and flow on color Doppler imaging. Femoral Vein: No evidence of thrombus. Normal compressibility, respiratory phasicity and response to augmentation. Popliteal Vein: No evidence of thrombus. Normal compressibility,  respiratory phasicity and response to augmentation. Calf Veins: No evidence of thrombus. Normal compressibility and flow on color Doppler imaging. Superficial Great Saphenous Vein: No evidence of thrombus. Normal compressibility. IMPRESSION: No evidence of deep venous thrombosis in either lower extremity. Electronically Signed   By: Margaretha Sheffield M.D.    On: 10/16/2020 10:38   US ARTERIAL ABI (SCREENING LOWER EXTREMITY)  Result Date: 10/16/2020 CLINICAL DATA:  Peripheral arterial disease. History of bilateral common iliac artery stents. EXAM: NONINVASIVE PHYSIOLOGIC VASCULAR STUDY OF BILATERAL LOWER EXTREMITIES TECHNIQUE: Evaluation of both lower extremities were performed at rest, including calculation of ankle-brachial indices with single level Doppler, pressure and pulse volume recording. COMPARISON:  CT 06/20/2020 FINDINGS: Right ABI:  1.21 Left ABI:  1.09 Right Lower Extremity: Irregularity with some triphasic Doppler waveforms. Left Lower Extremity: Irregularity with some triphasic Doppler waveforms. 1.0-1.4 Normal IMPRESSION: Normal resting ankle-brachial indices. Electronically Signed   By: Markus Daft M.D.   On: 10/16/2020 16:49   DG Chest Port 1 View  Result Date: 10/16/2020 CLINICAL DATA:  CHF EXAM: PORTABLE CHEST 1 VIEW COMPARISON:  10/15/2020 FINDINGS: Cardiomegaly and vascular pedicle widening with hazy density at the bases from pleural fluid and atelectasis at least. Congestive appearance of hilar vessels. Diffuse atheromatous calcification of the aorta. No pneumothorax. IMPRESSION: Continued congestion/edema with pleural effusions and presumed atelectasis at the bases. Electronically Signed   By: Jorje Guild M.D.   On: 10/16/2020 04:39   DG Chest Port 1 View  Result Date: 10/15/2020 CLINICAL DATA:  Fall, dizziness EXAM: PORTABLE CHEST 1 VIEW COMPARISON:  06/20/2020 FINDINGS: Lung volumes are small and pulmonary insufflation has diminished when compared to prior examination. Interval development of mild bilateral perihilar interstitial pulmonary infiltrate, most in keeping with interstitial pulmonary edema, as well as small bilateral pleural effusions, right greater than left. Cardiomegaly is stable. No pneumothorax. No acute bone abnormality. Bilateral calcified breast implants noted. IMPRESSION: Interval development of mild to  moderate CHF.  Stable cardiomegaly Pulmonary hypoinflation Electronically Signed   By: Fidela Salisbury M.D.   On: 10/15/2020 20:45   ECHOCARDIOGRAM COMPLETE  Result Date: 10/16/2020    ECHOCARDIOGRAM REPORT   Patient Name:   CONGETTA ODRISCOLL Allbee Date of Exam: 10/16/2020 Medical Rec #:  [id number]      Height:       65.0 in Accession #:    [id number]     Weight:       160.0 lb Date of Birth:  10-23-1945      BSA:          1.799 m Patient Age:    69 years       BP:           103/73 mmHg Patient Gender: F              HR:           94 bpm. Exam Location:  [facility] Procedure: 2D Echo, Color Doppler and Cardiac Doppler Indications:     I50.21 congestive heart failure-Acute Systolic  History:         Patient has prior history of Echocardiogram examinations, most                  recent 02/12/2020. CHF, CAD, Signs/Symptoms:Shortness of Breath                  and Edema; Risk Factors:Hypertension, Dyslipidemia and Sleep  Apnea.  Sonographer:     Charmayne Sheer Referring Phys:  [id number] Bing Neighbors OUMA Diagnosing Phys: Isaias Cowman MD IMPRESSIONS  1. Left ventricular ejection fraction, by estimation, is 45 to 50%. The left ventricle has mildly decreased function. The left ventricle has no regional wall motion abnormalities. Left ventricular diastolic parameters are indeterminate.  2. Right ventricular systolic function is normal. The right ventricular size is normal.  3. A small pericardial effusion is present.  4. The mitral valve is normal in structure. Mild mitral valve regurgitation. No evidence of mitral stenosis.  5. The aortic valve is normal in structure. Aortic valve regurgitation is not visualized. No aortic stenosis is present.  6. The inferior vena cava is normal in size with greater than 50% respiratory variability, suggesting right atrial pressure of 3 mmHg. FINDINGS  Left Ventricle: Left ventricular ejection fraction, by estimation, is 45 to 50%. The left ventricle has mildly decreased function.  The left ventricle has no regional wall motion abnormalities. The left ventricular internal cavity size was normal in size. There is no left ventricular hypertrophy. Left ventricular diastolic parameters are indeterminate. Right Ventricle: The right ventricular size is normal. No increase in right ventricular wall thickness. Right ventricular systolic function is normal. Left Atrium: Left atrial size was normal in size. Right Atrium: Right atrial size was normal in size. Pericardium: A small pericardial effusion is present. Mitral Valve: The mitral valve is normal in structure. Mild mitral valve regurgitation. No evidence of mitral valve stenosis. MV peak gradient, 4.8 mmHg. The mean mitral valve gradient is 2.0 mmHg. Tricuspid Valve: The tricuspid valve is normal in structure. Tricuspid valve regurgitation is mild . No evidence of tricuspid stenosis. Aortic Valve: The aortic valve is normal in structure. Aortic valve regurgitation is not visualized. No aortic stenosis is present. Aortic valve mean gradient measures 4.0 mmHg. Aortic valve peak gradient measures 6.5 mmHg. Aortic valve area, by VTI measures 1.89 cm. Pulmonic Valve: The pulmonic valve was normal in structure. Pulmonic valve regurgitation is not visualized. No evidence of pulmonic stenosis. Aorta: The aortic root is normal in size and structure. Venous: The inferior vena cava is normal in size with greater than 50% respiratory variability, suggesting right atrial pressure of 3 mmHg. IAS/Shunts: No atrial level shunt detected by color flow Doppler.  LEFT VENTRICLE PLAX 2D LVIDd:         4.50 cm   Diastology LVIDs:         2.98 cm   LV e' medial:    7.40 cm/s LV PW:         1.24 cm   LV E/e' medial:  14.3 LV IVS:        0.75 cm   LV e' lateral:   6.74 cm/s LVOT diam:     1.90 cm   LV E/e' lateral: 15.7 LV SV:         44 LV SV Index:   24 LVOT Area:     2.84 cm  RIGHT VENTRICLE RV Basal diam:  3.59 cm LEFT ATRIUM           Index        RIGHT ATRIUM            Index LA diam:      5.90 cm 3.28 cm/m   RA Area:     22.00 cm LA Vol (A2C): 41.5 ml 23.07 ml/m  RA Volume:   58.70 ml  32.63 ml/m LA Vol (A4C): 84.1 ml 46.74 ml/m  AORTIC VALVE                    PULMONIC VALVE AV Area (Vmax):    1.90 cm     PV Vmax:       0.66 m/s AV Area (Vmean):   1.88 cm     PV Vmean:      44.600 cm/s AV Area (VTI):     1.89 cm     PV VTI:        0.102 m AV Vmax:           127.00 cm/s  PV Peak grad:  1.7 mmHg AV Vmean:          88.200 cm/s  PV Mean grad:  1.0 mmHg AV VTI:            0.231 m AV Peak Grad:      6.5 mmHg AV Mean Grad:      4.0 mmHg LVOT Vmax:         84.90 cm/s LVOT Vmean:        58.500 cm/s LVOT VTI:          0.154 m LVOT/AV VTI ratio: 0.67  AORTA Ao Root diam: 2.80 cm MITRAL VALVE                TRICUSPID VALVE MV Area (PHT): 5.22 cm     TR Peak grad:   29.8 mmHg MV Area VTI:   1.51 cm     TR Vmax:        273.00 cm/s MV Peak grad:  4.8 mmHg MV Mean grad:  2.0 mmHg     SHUNTS MV Vmax:       1.09 m/s     Systemic VTI:  0.15 m MV Vmean:      53.9 cm/s    Systemic Diam: 1.90 cm MV Decel Time: 145 msec MV E velocity: 105.67 cm/s Isaias Cowman MD Electronically signed by Isaias Cowman MD Signature Date/Time: 10/16/2020/1:32:50 PM    Final       Subjective: Patient reports shortness of breath although improving since admission, CHF and COPD  Objective: Vitals:   10/18/20 0217 10/18/20 0435 10/18/20 0805 10/18/20 1224  BP:  (!) 95/51 (!) 89/50 102/78  Pulse:  94 90 (!) 110  Resp:  18 17 17   Temp:  98.4 F (36.9 C) 98 F (36.7 C) 97.9 F (36.6 C)  TempSrc:  Oral    SpO2: 99% 99% 97% 93%  Weight:  75.2 kg    Height:        Intake/Output Summary (Last 24 hours) at 10/18/2020 1403 Last data filed at 10/18/2020 0300 Gross per 24 hour  Intake 111.18 ml  Output 550 ml  Net -438.82 ml   Filed Weights   10/15/20 1907 10/18/20 0435  Weight: 72.6 kg 75.2 kg    Examination:  General exam: Appears calm and comfortable  Respiratory  system: Rales bilaterally, trace wheezing  cardiovascular system: Irregular irregular rate controlled gastrointestinal system: Abdomen is nondistended, soft and nontender. No organomegaly or masses felt. Normal bowel sounds heard. Central nervous system: Alert and oriented. No focal neurological deficits. Extremities: Well perfused no edema Skin: No rashes, lesions or ulcers Psychiatry: Judgement and insight appear normal. Mood & affect appropriate.     Data Reviewed: I have personally reviewed following labs and imaging studies  CBC: Recent Labs  Lab 10/15/20 2026 10/16/20 0646 10/17/20 0622 10/18/20 0450  WBC 9.7 7.9 7.1 7.2  NEUTROABS 7.8*  --   --   --   HGB 10.1* 9.2* 9.3* 9.0*  HCT 37.4 33.0* 35.3* 32.5*  MCV 80.6 80.1 79.9* 78.9*  PLT 270 235 212 027   Basic Metabolic Panel: Recent Labs  Lab 10/15/20 2026 10/16/20 0646 10/17/20 0622 10/18/20 0450  NA 139 141 140 139  K 4.2 3.9 3.5 4.3  CL 100 101 96* 98  CO2 35* 33* 36* 36*  GLUCOSE 119* 125* 100* 122*  BUN 35* 34* 25* 22  CREATININE 1.13* 1.07* 1.11* 0.97  CALCIUM 8.6* 8.5* 8.3* 8.6*  MG  --  2.0 1.8 1.8  PHOS  --  3.2 3.9 2.4*   GFR: Estimated Creatinine Clearance: 50.9 mL/min (by C-G formula based on SCr of 0.97 mg/dL). Liver Function Tests: Recent Labs  Lab 10/15/20 2026  AST 20  ALT 17  ALKPHOS 66  BILITOT 0.8  PROT 5.9*  ALBUMIN 3.5   No results for input(s): LIPASE, AMYLASE in the last 168 hours. No results for input(s): AMMONIA in the last 168 hours. Coagulation Profile: Recent Labs  Lab 10/15/20 2026  INR 1.9*   Cardiac Enzymes: No results for input(s): CKTOTAL, CKMB, CKMBINDEX, TROPONINI in the last 168 hours. BNP (last 3 results) No results for input(s): PROBNP in the last 8760 hours. HbA1C: No results for input(s): HGBA1C in the last 72 hours. CBG: Recent Labs  Lab 10/16/20 1635 10/16/20 2016  GLUCAP 176* 108*   Lipid Profile: No results for input(s): CHOL, HDL, LDLCALC,  TRIG, CHOLHDL, LDLDIRECT in the last 72 hours. Thyroid Function Tests: Recent Labs    10/16/20 0646  TSH 2.238   Anemia Panel: Recent Labs    10/16/20 0646 10/16/20 1451  VITAMINB12  --  128*  FOLATE 7.3  --   TIBC 428  --   IRON 18*  --    Sepsis Labs: Recent Labs  Lab 10/15/20 2026 10/15/20 2049 10/15/20 2216 10/16/20 0646 10/17/20 0622  PROCALCITON <0.10  --   --  <0.10 <0.10  LATICACIDVEN  --  1.0 1.0 1.3  --     Recent Results (from the past 240 hour(s))  Blood culture (routine single)     Status: None (Preliminary result)   Collection Time: 10/15/20  8:49 PM   Specimen: BLOOD  Result Value Ref Range Status   Specimen Description BLOOD RIGHT ANTECUBITAL  Final   Special Requests   Final    BOTTLES DRAWN AEROBIC AND ANAEROBIC Blood Culture adequate volume   Culture   Final    NO GROWTH 3 DAYS Performed at [facility], [address] [place], [place] [address]    Report Status PENDING  Incomplete  Urine Culture     Status: None   Collection Time: 10/15/20  8:50 PM   Specimen: Urine, Random  Result Value Ref Range Status   Specimen Description   Final    URINE, RANDOM Performed at [facility], [address], [place], [place] [address]    Special Requests   Final    NONE Performed at [facility], [address], [place], [place] [address]    Culture   Final    NO GROWTH Performed at [facility], [place] [address], [place],  [address]    Report Status 10/17/2020 FINAL  Final  Resp Panel by RT-PCR (Flu A&B, Covid) Nasopharyngeal Swab     Status: None   Collection Time: 10/15/20  8:50 PM   Specimen: Nasopharyngeal Swab;  Nasopharyngeal(NP) swabs in vial transport medium  Result Value Ref Range Status   SARS Coronavirus 2 by RT PCR NEGATIVE NEGATIVE Final    Comment: (NOTE) SARS-CoV-2 target nucleic acids are NOT DETECTED.  The SARS-CoV-2 RNA is generally detectable in upper respiratory specimens during the  acute phase of infection. The lowest concentration of SARS-CoV-2 viral copies this assay can detect is 138 copies/mL. A negative result does not preclude SARS-Cov-2 infection and should not be used as the sole basis for treatment or other patient management decisions. A negative result may occur with  improper specimen collection/handling, submission of specimen other than nasopharyngeal swab, presence of viral mutation(s) within the areas targeted by this assay, and inadequate number of viral copies(<138 copies/mL). A negative result must be combined with clinical observations, patient history, and epidemiological information. The expected result is Negative.  Fact Sheet for Patients:  EntrepreneurPulse.com.au  Fact Sheet for Healthcare Providers:  IncredibleEmployment.be  This test is no t yet approved or cleared by the [place] FDA and  has been authorized for detection and/or diagnosis of SARS-CoV-2 by FDA under an Emergency Use Authorization (EUA). This EUA will remain  in effect (meaning this test can be used) for the duration of the COVID-19 declaration under Section 564(b)(1) of the Act, 21 U.S.C.section 360bbb-3(b)(1), unless the authorization is terminated  or revoked sooner.       Influenza A by PCR NEGATIVE NEGATIVE Final   Influenza B by PCR NEGATIVE NEGATIVE Final    Comment: (NOTE) The Xpert Xpress SARS-CoV-2/FLU/RSV plus assay is intended as an aid in the diagnosis of influenza from Nasopharyngeal swab specimens and should not be used as a sole basis for treatment. Nasal washings and aspirates are unacceptable for Xpert Xpress SARS-CoV-2/FLU/RSV testing.  Fact Sheet for Patients: EntrepreneurPulse.com.au  Fact Sheet for Healthcare Providers: IncredibleEmployment.be  This test is not yet approved or cleared by the [place] FDA and has been authorized for detection and/or  diagnosis of SARS-CoV-2 by FDA under an Emergency Use Authorization (EUA). This EUA will remain in effect (meaning this test can be used) for the duration of the COVID-19 declaration under Section 564(b)(1) of the Act, 21 U.S.C. section 360bbb-3(b)(1), unless the authorization is terminated or revoked.  Performed at [facility], [address] [place], [place] [address]          Radiology Studies: US ARTERIAL ABI (SCREENING LOWER EXTREMITY)  Result Date: 10/16/2020 CLINICAL DATA:  Peripheral arterial disease. History of bilateral common iliac artery stents. EXAM: NONINVASIVE PHYSIOLOGIC VASCULAR STUDY OF BILATERAL LOWER EXTREMITIES TECHNIQUE: Evaluation of both lower extremities were performed at rest, including calculation of ankle-brachial indices with single level Doppler, pressure and pulse volume recording. COMPARISON:  CT 06/20/2020 FINDINGS: Right ABI:  1.21 Left ABI:  1.09 Right Lower Extremity: Irregularity with some triphasic Doppler waveforms. Left Lower Extremity: Irregularity with some triphasic Doppler waveforms. 1.0-1.4 Normal IMPRESSION: Normal resting ankle-brachial indices. Electronically Signed   By: Markus Daft M.D.   On: 10/16/2020 16:49        Scheduled Meds:  clopidogrel  75 mg Oral Daily   cyanocobalamin  1,000 mcg Intramuscular Daily   Followed by   Derrill Memo ON 10/24/2020] vitamin B-12  500 mcg Oral Daily   famotidine  20 mg Oral BID   FLUoxetine  20 mg Oral Daily   fluticasone furoate-vilanterol  1 puff Inhalation Daily   folic acid  1 mg Oral Daily   furosemide  20 mg Intravenous BID   gabapentin  600 mg Oral QHS   ipratropium-albuterol  3 mL Nebulization Q6H   [START ON 10/22/2020] iron polysaccharides  150 mg Oral Daily   magnesium oxide  400 mg Oral BID   metoprolol tartrate  12.5 mg Oral BID   pantoprazole  40 mg Oral Daily   predniSONE  40 mg Oral Q breakfast   rivaroxaban  20 mg Oral Q supper   rosuvastatin  20 mg Oral Daily    Vitamin D (Ergocalciferol)  50,000 Units Oral Q7 days   Continuous Infusions:  iron sucrose 200 mg (10/18/20 1014)     LOS: 3 days    Time spent: [place]    Nicolette Bang, MD [facility]  If 7PM-7AM, please contact night-coverage  10/18/2020, 2:03 PM

## 2020-10-18 NOTE — Evaluation (Signed)
Physical Therapy Evaluation Patient Details Name: Kayla Maxwell MRN: [id number] DOB: [birth year] Today's Date: 10/18/2020  History of Present Illness  75 y.o with significant PMH as below who presented to the ED with chief complaints of dizziness, generalized weakness and near syncopal episode.  Clinical Impression  Patient received half out of the bed. Seems somewhat confused, but able to tell me where she is and some history. She requires min assist for bed mobility/safety. Min assist for sit to stand. Unsteady in standing and walking, Requires min assist and RW with max cues for safe use of walker and obstacle avoidance in room. She is jittery throughout session. Knees buckling with ambulation. Patient will continue to benefit from skilled PT while here to improve safety, strength and independence.            Recommendations for follow up therapy are one component of a multi-disciplinary discharge planning process, led by the attending physician.  Recommendations may be updated based on patient status, additional functional criteria and insurance authorization.  Follow Up Recommendations SNF;Supervision/Assistance - 24 hour    Equipment Recommendations  None recommended by PT    Recommendations for Other Services       Precautions / Restrictions Precautions Precautions: Fall Restrictions Weight Bearing Restrictions: No      Mobility  Bed Mobility Overal bed mobility: Needs Assistance Bed Mobility: Supine to Sit     Supine to sit: Min assist     General bed mobility comments: requires min assist and cues for safety. Attempting to stand prior to me being ready. Unaware of lines, safety    Transfers Overall transfer level: Needs assistance Equipment used: Rolling walker (2 wheeled) Transfers: Sit to/from Stand Sit to Stand: Min assist         General transfer comment: Patient requires assist for lines, safety. Knees buckling. Cues to stay close to  AD  Ambulation/Gait Ambulation/Gait assistance: Min assist Gait Distance (Feet): 12 Feet Assistive device: Rolling walker (2 wheeled) Gait Pattern/deviations: Step-through pattern;Decreased step length - right;Decreased step length - left Gait velocity: decr   General Gait Details: patient with kness buclking throughout ambulation.poor safety awareness, cues and assist needed to manage walker. Generally unsafe. And urinated all over the floor upon standing. Difficult to assess O2 sats with mobility. Not getting read on monitor.  Stairs            Wheelchair Mobility    Modified Rankin (Stroke Patients Only)       Balance Overall balance assessment: Needs assistance Sitting-balance support: Feet supported Sitting balance-Leahy Scale: Fair Sitting balance - Comments: able to sit with supervision   Standing balance support: Bilateral upper extremity supported;During functional activity Standing balance-Leahy Scale: Poor Standing balance comment: Knees buckling requires B UE support and physical assist                             Pertinent Vitals/Pain Pain Assessment: No/denies pain    Home Living Family/patient expects to be discharged to:: Private residence Living Arrangements: Alone Available Help at Discharge: Available PRN/intermittently;Family Type of Home: House Home Access: Stairs to enter Entrance Stairs-Rails: Right Entrance Stairs-Number of Steps: threshold step   Home Equipment: Walker - 2 wheels;Cane - single point      Prior Function Level of Independence: Independent         Comments: Pt reports being independent without AD, no falls, doesn't drive. Daughter lives 2 houses down & can assist PRN.  History taken from prior admission.     Hand Dominance   Dominant Hand: Right    Extremity/Trunk Assessment   Upper Extremity Assessment Upper Extremity Assessment: Generalized weakness    Lower Extremity Assessment Lower Extremity  Assessment: Generalized weakness    Cervical / Trunk Assessment Cervical / Trunk Assessment: Kyphotic  Communication   Communication: No difficulties  Cognition Arousal/Alertness: Awake/alert Behavior During Therapy: Restless;Anxious Overall Cognitive Status: No family/caregiver present to determine baseline cognitive functioning                                 General Comments: Patient able to answer questions and tell me where she is but has difficult follwing direction at times and poor safety awareness      General Comments      Exercises     Assessment/Plan    PT Assessment Patient needs continued PT services  PT Problem List Decreased strength;Decreased mobility;Decreased safety awareness;Decreased activity tolerance;Decreased balance;Decreased knowledge of precautions;Decreased knowledge of use of DME;Decreased cognition;Cardiopulmonary status limiting activity       PT Treatment Interventions      PT Goals (Current goals can be found in the Care Plan section)  Acute Rehab PT Goals Patient Stated Goal: none stated Time For Goal Achievement: 11/01/20    Frequency Min 2X/week   Barriers to discharge Decreased caregiver support      Co-evaluation               AM-PAC PT "6 Clicks" Mobility  Outcome Measure Help needed turning from your back to your side while in a flat bed without using bedrails?: A Lot Help needed moving from lying on your back to sitting on the side of a flat bed without using bedrails?: A Lot Help needed moving to and from a bed to a chair (including a wheelchair)?: A Lot Help needed standing up from a chair using your arms (e.g., wheelchair or bedside chair)?: A Little Help needed to walk in hospital room?: A Lot Help needed climbing 3-5 steps with a railing? : A Lot 6 Click Score: 13    End of Session Equipment Utilized During Treatment: Gait belt;Oxygen Activity Tolerance: Patient limited by fatigue Patient left:  in chair;with call bell/phone within reach;with chair alarm set;with nursing/sitter in room Nurse Communication: Mobility status PT Visit Diagnosis: Unsteadiness on feet (R26.81);Other abnormalities of gait and mobility (R26.89);Muscle weakness (generalized) (M62.81);Difficulty in walking, not elsewhere classified (R26.2);History of falling (Z91.81);Dizziness and giddiness (R42)    Time: 6384-5364 PT Time Calculation (min) (ACUTE ONLY): 31 min   Charges:   PT Evaluation $PT Eval Moderate Complexity: 1 Mod PT Treatments $Gait Training: 8-22 mins        Danzell Birky, PT, GCS 10/18/20,11:45 AM

## 2020-10-19 ENCOUNTER — Inpatient Hospital Stay: Payer: Medicare Other

## 2020-10-19 DIAGNOSIS — J9601 Acute respiratory failure with hypoxia: Secondary | ICD-10-CM | POA: Diagnosis not present

## 2020-10-19 DIAGNOSIS — J441 Chronic obstructive pulmonary disease with (acute) exacerbation: Secondary | ICD-10-CM | POA: Diagnosis not present

## 2020-10-19 DIAGNOSIS — I10 Essential (primary) hypertension: Secondary | ICD-10-CM | POA: Diagnosis not present

## 2020-10-19 DIAGNOSIS — I509 Heart failure, unspecified: Secondary | ICD-10-CM | POA: Diagnosis not present

## 2020-10-19 LAB — CBC
HCT: 37.1 % (ref 36.0–46.0)
Hemoglobin: 9.9 g/dL — ABNORMAL LOW (ref 12.0–15.0)
MCH: 21.6 pg — ABNORMAL LOW (ref 26.0–34.0)
MCHC: 26.7 g/dL — ABNORMAL LOW (ref 30.0–36.0)
MCV: 81 fL (ref 80.0–100.0)
Platelets: 228 10*3/uL (ref 150–400)
RBC: 4.58 MIL/uL (ref 3.87–5.11)
RDW: 17.3 % — ABNORMAL HIGH (ref 11.5–15.5)
WBC: 11.1 10*3/uL — ABNORMAL HIGH (ref 4.0–10.5)
nRBC: 0.6 % — ABNORMAL HIGH (ref 0.0–0.2)

## 2020-10-19 LAB — BASIC METABOLIC PANEL
Anion gap: 4 — ABNORMAL LOW (ref 5–15)
BUN: 26 mg/dL — ABNORMAL HIGH (ref 8–23)
CO2: 38 mmol/L — ABNORMAL HIGH (ref 22–32)
Calcium: 9.1 mg/dL (ref 8.9–10.3)
Chloride: 95 mmol/L — ABNORMAL LOW (ref 98–111)
Creatinine, Ser: 0.92 mg/dL (ref 0.44–1.00)
GFR, Estimated: >60 mL/min (ref >60)
Glucose, Bld: 124 mg/dL — ABNORMAL HIGH (ref 70–99)
Potassium: 4.6 mmol/L (ref 3.5–5.1)
Sodium: 137 mmol/L (ref 135–145)

## 2020-10-19 LAB — MAGNESIUM: Magnesium: 2.2 mg/dL (ref 1.7–2.4)

## 2020-10-19 LAB — PHOSPHORUS: Phosphorus: 3.5 mg/dL (ref 2.5–4.6)

## 2020-10-19 MED ORDER — ADULT MULTIVITAMIN W/MINERALS CH
1.0000 | ORAL_TABLET | Freq: Every day | ORAL | Status: DC
  Administered 2020-10-19 – 2020-10-23 (×5): 1 via ORAL
  Filled 2020-10-19 (×5): qty 1

## 2020-10-19 MED ORDER — ENSURE ENLIVE PO LIQD
237.0000 mL | ORAL | Status: DC
  Administered 2020-10-19: 237 mL via ORAL

## 2020-10-19 MED ORDER — BOOST / RESOURCE BREEZE PO LIQD CUSTOM
1.0000 | ORAL | Status: DC
  Administered 2020-10-20 – 2020-10-21 (×2): 1 via ORAL

## 2020-10-19 NOTE — NC FL2 (Signed)
[place] LEVEL OF CARE SCREENING TOOL     IDENTIFICATION  Patient Name: Kayla Maxwell Birthdate: 08/07/1945 Sex: female Admission Date (Current Location): 10/15/2020  [facility] and [place] Number:  Engineering geologist and Address:  [facility], [address], [place], [place] [address]      Provider Number: [id number]  Attending Physician Name and Address:  Marcell Anger*  Relative Name and Phone Number:       Current Level of Care: Hospital Recommended Level of Care: [place] Prior Approval Number:    Date Approved/Denied:   PASRR Number: [id number]  Discharge Plan: SNF    Current Diagnoses: Patient Active Problem List   Diagnosis Date Noted   Acute respiratory failure with hypoxia ([place]) 10/15/2020   Neuropathy of both feet 08/18/2020   Decreased activities of daily living (ADL) 80/99/8338   Acute systolic CHF (congestive heart failure) ([place]) 03/20/2020   Venous insufficiency of both lower extremities 11/02/2017   Benign essential HTN 11/01/2016   PAD (peripheral artery disease) ([place]) 01/14/2016   Edema 09/05/2014   Atrial fibrillation by electrocardiography ([place]) 04/29/2014    Orientation RESPIRATION BLADDER Height & Weight     Self, Time, Place  O2 ([place] 3L) Incontinent Weight: 165 lb 11.2 oz (75.2 kg) Height:  5\' 5"  (165.1 cm)  BEHAVIORAL SYMPTOMS/MOOD NEUROLOGICAL BOWEL NUTRITION STATUS      Incontinent Diet (2 gram sodium, Fluid consistency: Thin; Fluid restriction: 1500 mL)  AMBULATORY STATUS COMMUNICATION OF NEEDS Skin   Limited Assist Verbally PU Stage and Appropriate Care PU Stage 1 Dressing:  (sacrum, foam dressing, change PRN)                     Personal Care Assistance Level of Assistance  Feeding, Bathing, Dressing Bathing Assistance: Limited assistance Feeding assistance: Independent Dressing Assistance: Limited assistance     Functional Limitations Info  Sight,  Hearing, Speech Sight Info: Adequate Hearing Info: Adequate Speech Info: Adequate    SPECIAL CARE FACTORS FREQUENCY  PT (By licensed PT), OT (By licensed OT)     PT Frequency: 5x OT Frequency: 5x            Contractures Contractures Info: Not present    Additional Factors Info  Code Status, Allergies Code Status Info: DNR Allergies Info: Codeine, Penicillins, Latex, Adhesive (Tape), Sulfa Antibiotics           Current Medications (10/19/2020):  This is the current hospital active medication list Current Facility-Administered Medications  Medication Dose Route Frequency Provider Last Rate Last Admin   albuterol (PROVENTIL) (2.5 MG/3ML) 0.083% nebulizer solution 2.5 mg  2.5 mg Nebulization Q6H PRN Val Riles, MD   2.5 mg at 10/17/20 1313   clopidogrel (PLAVIX) tablet 75 mg  75 mg Oral Daily Lang Snow, NP   75 mg at 10/19/20 0935   cyanocobalamin ((VITAMIN B-12)) injection 1,000 mcg  1,000 mcg Intramuscular Daily Val Riles, MD   1,000 mcg at 10/19/20 0930   Followed by   Derrill Memo ON 10/24/2020] vitamin B-12 (CYANOCOBALAMIN) tablet 500 mcg  500 mcg Oral Daily Val Riles, MD       docusate sodium (COLACE) capsule 100 mg  100 mg Oral BID PRN Lang Snow, NP       famotidine (PEPCID) tablet 20 mg  20 mg Oral BID Lang Snow, NP   20 mg at 10/19/20 0935   FLUoxetine (PROZAC) capsule 20 mg  20 mg Oral Daily  Lang Snow, NP   20 mg at 10/19/20 0936   fluticasone furoate-vilanterol (BREO ELLIPTA) 200-25 MCG/INH 1 puff  1 puff Inhalation Daily Val Riles, MD   1 puff at 63/84/53 6468   folic acid (FOLVITE) tablet 1 mg  1 mg Oral Daily Val Riles, MD   1 mg at 10/19/20 0935   furosemide (LASIX) injection 20 mg  20 mg Intravenous BID Val Riles, MD   20 mg at 10/19/20 0931   gabapentin (NEURONTIN) capsule 600 mg  600 mg Oral QHS Val Riles, MD   600 mg at 10/18/20 2150   HYDROcodone-acetaminophen (NORCO/VICODIN) 5-325  MG per tablet 1-2 tablet  1-2 tablet Oral BID PRN Val Riles, MD   1 tablet at 10/18/20 1318   ipratropium-albuterol (DUONEB) 0.5-2.5 (3) MG/3ML nebulizer solution 3 mL  3 mL Nebulization Q6H Lang Snow, NP   3 mL at 10/19/20 0756   iron sucrose (VENOFER) 200 mg in sodium chloride 0.9 % 100 mL IVPB  200 mg Intravenous Daily Val Riles, MD 440 mL/hr at 10/19/20 0934 200 mg at 10/19/20 0934   Followed by   Derrill Memo ON 10/22/2020] iron polysaccharides (NIFEREX) capsule 150 mg  150 mg Oral Daily Val Riles, MD       metoprolol tartrate (LOPRESSOR) tablet 12.5 mg  12.5 mg Oral BID Val Riles, MD   12.5 mg at 10/18/20 2151   nitroGLYCERIN (NITROSTAT) SL tablet 0.4 mg  0.4 mg Sublingual Q5 Min x 3 PRN Lang Snow, NP       ondansetron (ZOFRAN-ODT) disintegrating tablet 4 mg  4 mg Oral Q8H PRN Lang Snow, NP       pantoprazole (PROTONIX) EC tablet 40 mg  40 mg Oral Daily Lang Snow, NP   40 mg at 10/19/20 0935   polyethylene glycol (MIRALAX / GLYCOLAX) packet 17 g  17 g Oral Daily PRN Lang Snow, NP       rivaroxaban (XARELTO) tablet 20 mg  20 mg Oral Q supper Lang Snow, NP   20 mg at 10/18/20 1721   rosuvastatin (CRESTOR) tablet 20 mg  20 mg Oral Daily Lang Snow, NP   20 mg at 10/19/20 0321   Vitamin D (Ergocalciferol) (DRISDOL) capsule 50,000 Units  50,000 Units Oral Q7 days Val Riles, MD   50,000 Units at 10/17/20 1124     Discharge Medications: Please see discharge summary for a list of discharge medications.  Relevant Imaging Results:  Relevant Lab Results:   Additional Information [id number]  Eileen Stanford, LCSW

## 2020-10-19 NOTE — Progress Notes (Signed)
PROGRESS NOTE    Kayla Maxwell  KYH:[id number] DOB: Jan 07, 1945 DOA: 10/15/2020 PCP: Gwyneth Sprout, FNP   Brief Narrative:  On admission: "75 y.o with significant PMH as below who presented to the ED with chief complaints of dizziness, generalized weakness and near syncopal episode.  Patient daughter at the bedside, patient has been complaining of generalized weakness, activity intolerance and imbalance for the past 1 week.  Daughter stated that this morning at around 945 when she was talking to her, patient sounded like she was slurring her speech and not acting her usual self.  EMS was called today for progressive weakness and on answering the door patient states she became dizzy and lost her balance falling on the ground.  She denies hitting her head or loss of consciousness.  Patient also complained of shortness of breath on route to the ED and was placed on O2 at 4 L.  Denies other associated symptoms of numbness or tingling, chest pain, fevers or chills, nausea or vomiting, diaphoresis or cough."   ED Course: On arrival to the ED, She was afebrile with blood pressure 101/64 mm Hg and pulse rate 74 beats/min. RR breaths per minute, oxygen saturation 77% on 4 L.  There were no focal neurological deficits; She was alert and oriented x4, but appeared pale and lethargic.  The right foot was noted to be erythematous and warm to touch concerning for possible cellulitis. Initial pertinent labs/Diagnostics WBC/Hgb/Hct/Plts:  9.7/10.1/37.4/270 (10/12 2026)  CO2 35, glucose 119, BUN 35, creatinine 1.13, calcium 8.6 Troponin:  63  BNP: 967.2 UA: Pending VBG:  pO2 32; pCO2 85; pH 7.26;  HCO3 38.1, %O2 Sat 51 Lactate: 1.0 EKG: unchanged from previous tracings, atrial fibrillation, rate 80. CXR: Interval development of mild to moderate CHF Other Imaging: CT head negative for acute intracranial abnormality. Given concerns for possible sepsis in the setting of cellulitis and probable underlying  pneumonia versus CHF exacerbation, patient received gentle fluid hydration and was started on broad-spectrum antibiotics with aztreonam due to penicillin allergy.  PCCM consulted for medical optimization   Assessment & Plan:   Active Problems:   Acute respiratory failure with hypoxia (HCC)   Acute Hypoxic Hypercapnic Respiratory Failure secondary to COPD and CHF exacerbation PMHx: OSA on CPAP -Supplemental O2 as needed to maintain O2 saturations 88 to 92% -BiPAP, wean as tolerated -Follow intermittent ABG and chest x-ray as needed -IV Lasix as blood pressure and renal function permits; currently on Lasix 20 mg IV BID -REPEAT CT pedning to eval for pna vs edema -As needed bronchodilators 10/14 started Breo Ellipta inhaler and prednisone 40 mg p.o. daily for 3 days Patient is [place] hypoxic and wheezing, requiring 4L oxygen via nasal cannula Patient was seen by heart failure nurse If no improvement patient may benefit from palliative care consultation     Erythema of bilateral feet, does not look cellulitis As previously noted: Sepsis ruled out  lactic: 1.0, Baseline PCT: <0.10, UA: Negative, CXR: No evidence of pneumonia, Pro-Cal negative Initial interventions/workup included: 500cc NS & aztreonam -Supplemental oxygen as needed, to maintain SpO2 > 90% -cultures negative, trend lactic/ PCT -monitor WBC/ fever curve -s/p IV antibiotics: Aztreonam due to PCN allergy, discontinued -Gentle IVF hydration as needed -Consider vasopressors to maintain MAP> 65 -Strict I/O's: keep UOP > 0.5 mL/kg/hr     Acute on chronic systolic CHF NYHA class III: EF of 30% by echocardiogram in May 2022  -Hypertension Hx: CAD status post STEMI with PCI of LAD in  02/2020, HLD  -Continuous cardiac monitoring -Maintain MAP greater than 65 -s/p Lasix IV 40 twice daily, decrease to 20 twice daily on 10/14 -Continue metoprolol as BP permits -Hold lisinopril in the setting of AKI -Cardiology consult Repeat  TTE shows LVEF 45 to 50%     Acute Metabolic Encephalopathy due to Hypercapnia -CT head negative for acute intracranial abnormality -Provide supportive care -BiPAP to treat Hypercapnia if indicated   Acute kidney injury  -Monitor I&O's / urinary output -Follow BMP -Ensure adequate renal perfusion -Avoid nephrotoxic agents as able -Replace electrolytes as indicated   H/o Severe PAD and PVD Bilateral lower extremity neuropathy - US Venous Lower bilateral negative for DVT, ABI normal -Patient's follows with Dr. Delana Meyer, requesting in-house evaluation Neuropathy could be secondary to vitamin B12 deficiency   Vitamin B12 deficiency, vitamin B12 level 128, started vitamin B12 1000 mcg IM injection daily for 7 days followed by oral supplement.  Follow with PCP for further management as an outpatient   Iron deficiency, iron saturation 4%, started Venofer IV during hospital stay, followed by oral supplement on discharge.   Vitamin D insufficiency, started vitamin D 50,000 units p.o. weekly.  Follow with PCP for further management outpatient   Folic acid level 7.3, at lower end, started folate supplement to prevent deficiency.       Diabetes mellitus -CBGs -Sliding scale insulin -Follow ICU hyper/hypoglycemia protocol -Hold home Meds     Body mass index is 26.63 kg/m.  Interventions:  DVT prophylaxis: OV:FIEPPIR   Code Status: DNR    Code Status Orders  (From admission, onward)           Start     Ordered   10/15/20 2239  Do not attempt resuscitation (DNR)  Continuous       Question Answer Comment  In the event of cardiac or respiratory ARREST Do not call a "code blue"   In the event of cardiac or respiratory ARREST Do not perform Intubation, CPR, defibrillation or ACLS   In the event of cardiac or respiratory ARREST Use medication by any route, position, wound care, and other measures to relive pain and suffering. May use oxygen, suction and manual treatment of airway  obstruction as needed for comfort.      10/15/20 2238           Code Status History     Date Active Date Inactive Code Status Order ID Comments User Context   10/15/2020 2209 10/15/2020 2238 Full Code [id number]  Lang Snow, NP ED   06/21/2020 0125 06/24/2020 1705 DNR [id number]  Sidney Ace, Arvella Merles, MD ED   02/12/2020 1341 02/15/2020 1808 Full Code [id number]  Vernelle Emerald, MD ED   02/20/2019 1558 02/21/2019 0014 Full Code [id number]  Schnier, Dolores Lory, MD Inpatient   01/23/2019 0957 01/23/2019 1926 Full Code [id number]  Schnier, Dolores Lory, MD Inpatient   08/16/2016 1944 08/17/2016 1755 Full Code [id number]  Max Sane, MD Inpatient   08/12/2015 1226 08/12/2015 1710 Full Code [id number]  Delana Meyer, Dolores Lory, MD Inpatient   06/06/2015 1310 06/08/2015 1317 Full Code [id number]  Vaughan Basta, MD Inpatient   08/19/2014 1129 08/29/2014 1825 Full Code [id number]  Karie Chimera, MD Inpatient   07/04/2014 1310 07/05/2014 1743 Full Code [id number]  Karie Chimera, MD Inpatient      Family Communication: Daughter at bedside Disposition Plan:    Patient not yet medically stable for discharge requiring IV diuresis, aggressive pulmonary support and IV management of CHF. Consults called: None  Admission status: Inpatient   Consultants:  Heart failure  Procedures:  CT HEAD WO CONTRAST  Result Date: 10/15/2020 CLINICAL DATA:  Dizziness.  Nonspecific. EXAM: CT HEAD WITHOUT CONTRAST TECHNIQUE: Contiguous axial images were obtained from the base of the skull through the vertex without intravenous contrast. COMPARISON:  None. BRAIN: BRAIN Chronic left frontoparietal encephalomalacia again noted. No evidence of large-territorial acute infarction. No parenchymal hemorrhage. No mass lesion. No extra-axial collection. No mass effect or midline shift. No hydrocephalus. Basilar cisterns are patent. Vascular: No hyperdense vessel. Atherosclerotic calcifications are present within the cavernous internal carotid  arteries. Skull: No acute fracture or focal lesion. Sinuses/Orbits: Paranasal sinuses and mastoid air cells are clear. Bilateral lens replacement. Orbits are unremarkable. Other: None. IMPRESSION: No acute intracranial abnormality. Electronically Signed   By: Iven Finn M.D.   On: 10/15/2020 19:41   DG Hand 2 View Right  Result Date: 10/15/2020 CLINICAL DATA:  [place] today.  Swelling of the right hand. EXAM: RIGHT HAND - 2 VIEW COMPARISON:  Right forearm 07/11/2020 FINDINGS: Plate and screw fixation of an old fracture deformity of the distal right radial metaphysis. Fracture line remains present, most likely due to incomplete healing rather than new fracture. The surgical hardware appears intact. There is an ununited ossicle at the ulnar styloid process which is also old. Diffuse bone demineralization. No additional fractures are identified. Soft tissue swelling over the dorsum of the right hand and wrist. No radiopaque soft tissue foreign bodies or soft tissue gas. Degenerative changes in the interphalangeal joints and STT joints. IMPRESSION: Previous internal fixation of an old fracture of the distal right radial metaphysis. Fracture line remains present likely indicating incomplete union. 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 Degenerative changes. Dorsal soft tissue swelling. Electronically Signed   By: Lucienne Capers M.D.   On: 10/15/2020 20:57   US Venous Img Lower Bilateral (DVT)  Result Date: 10/16/2020 CLINICAL DATA:  Cellulitis of right lower extremity L03.115 (ICD-10-CM) PVD (peripheral vascular disease) (HCC) I73.9 (ICD-10-CM) Redness and swelling of lower leg M79.89, R23.8 (ICD-10-CM) EXAM: BILATERAL LOWER EXTREMITY VENOUS DOPPLER ULTRASOUND TECHNIQUE: Gray-scale sonography with graded compression, as well as color Doppler and duplex ultrasound were performed to evaluate the lower extremity deep venous systems from the level of the common femoral vein and including the common  femoral, femoral, profunda femoral, popliteal and calf veins including the posterior tibial, peroneal and gastrocnemius veins when visible. The superficial great saphenous vein was also interrogated. Spectral Doppler was utilized to evaluate flow at rest and with distal augmentation maneuvers in the common femoral, femoral and popliteal veins. COMPARISON:  None. FINDINGS: Limitations: Difficult study due to uncontrollable patient motion. RIGHT LOWER EXTREMITY Common Femoral Vein: No evidence of thrombus. Normal compressibility, respiratory phasicity and response to augmentation. Saphenofemoral Junction: No evidence of thrombus. Normal compressibility and flow on color Doppler imaging. Profunda Femoral Vein: No evidence of thrombus. Normal compressibility and flow on color Doppler imaging. Femoral Vein: No evidence of thrombus. Normal compressibility, respiratory phasicity and response to augmentation. Popliteal Vein: No evidence of thrombus. Normal compressibility, respiratory phasicity and response to augmentation. Calf Veins: No evidence of thrombus. Normal compressibility and flow on color Doppler imaging. Superficial Great Saphenous Vein: No evidence of thrombus. Normal compressibility. LEFT LOWER EXTREMITY Common Femoral Vein: No evidence of thrombus. Normal compressibility, respiratory phasicity and response to augmentation. Saphenofemoral Junction: No evidence of thrombus. Normal compressibility and flow on color Doppler imaging. Profunda Femoral Vein: No evidence of thrombus. Normal compressibility and flow  on color Doppler imaging. Femoral Vein: No evidence of thrombus. Normal compressibility, respiratory phasicity and response to augmentation. Popliteal Vein: No evidence of thrombus. Normal compressibility, respiratory phasicity and response to augmentation. Calf Veins: No evidence of thrombus. Normal compressibility and flow on color Doppler imaging. Superficial Great Saphenous Vein: No evidence of  thrombus. Normal compressibility. IMPRESSION: No evidence of deep venous thrombosis in either lower extremity. Electronically Signed   By: Margaretha Sheffield M.D.   On: 10/16/2020 10:38   US ARTERIAL ABI (SCREENING LOWER EXTREMITY)  Result Date: 10/16/2020 CLINICAL DATA:  Peripheral arterial disease. History of bilateral common iliac artery stents. EXAM: NONINVASIVE PHYSIOLOGIC VASCULAR STUDY OF BILATERAL LOWER EXTREMITIES TECHNIQUE: Evaluation of both lower extremities were performed at rest, including calculation of ankle-brachial indices with single level Doppler, pressure and pulse volume recording. COMPARISON:  CT 06/20/2020 FINDINGS: Right ABI:  1.21 Left ABI:  1.09 Right Lower Extremity: Irregularity with some triphasic Doppler waveforms. Left Lower Extremity: Irregularity with some triphasic Doppler waveforms. 1.0-1.4 Normal IMPRESSION: Normal resting ankle-brachial indices. Electronically Signed   By: Markus Daft M.D.   On: 10/16/2020 16:49   DG Chest Port 1 View  Result Date: 10/19/2020 CLINICAL DATA:  Shortness of breath, productive cough. EXAM: PORTABLE CHEST 1 VIEW COMPARISON:  Chest x-rays dated 10/16/2020 and 10/15/2020. FINDINGS: Increasing bilateral perihilar and bibasilar airspace opacities. Stable cardiomegaly. No pneumothorax is seen. Suspect small bilateral pleural effusions. IMPRESSION: Increasing bilateral perihilar and bibasilar airspace opacities, compatible with multifocal pneumonia versus pulmonary edema. Probable small bilateral pleural effusions. Electronically Signed   By: Franki Cabot M.D.   On: 10/19/2020 09:23   DG Chest Port 1 View  Result Date: 10/16/2020 CLINICAL DATA:  CHF EXAM: PORTABLE CHEST 1 VIEW COMPARISON:  10/15/2020 FINDINGS: Cardiomegaly and vascular pedicle widening with hazy density at the bases from pleural fluid and atelectasis at least. Congestive appearance of hilar vessels. Diffuse atheromatous calcification of the aorta. No pneumothorax. IMPRESSION:  Continued congestion/edema with pleural effusions and presumed atelectasis at the bases. Electronically Signed   By: Jorje Guild M.D.   On: 10/16/2020 04:39   DG Chest Port 1 View  Result Date: 10/15/2020 CLINICAL DATA:  Fall, dizziness EXAM: PORTABLE CHEST 1 VIEW COMPARISON:  06/20/2020 FINDINGS: Lung volumes are small and pulmonary insufflation has diminished when compared to prior examination. Interval development of mild bilateral perihilar interstitial pulmonary infiltrate, most in keeping with interstitial pulmonary edema, as well as small bilateral pleural effusions, right greater than left. Cardiomegaly is stable. No pneumothorax. No acute bone abnormality. Bilateral calcified breast implants noted. IMPRESSION: Interval development of mild to moderate CHF.  Stable cardiomegaly Pulmonary hypoinflation Electronically Signed   By: Fidela Salisbury M.D.   On: 10/15/2020 20:45   ECHOCARDIOGRAM COMPLETE  Result Date: 10/16/2020    ECHOCARDIOGRAM REPORT   Patient Name:   LAURENCE FOLZ Kress Date of Exam: 10/16/2020 Medical Rec #:  [id number]      Height:       65.0 in Accession #:    [id number]     Weight:       160.0 lb Date of Birth:  1945/03/23      BSA:          1.799 m Patient Age:    37 years       BP:           103/73 mmHg Patient Gender: F              HR:  94 bpm. Exam Location:  [facility] Procedure: 2D Echo, Color Doppler and Cardiac Doppler Indications:     I50.21 congestive heart failure-Acute Systolic  History:         Patient has prior history of Echocardiogram examinations, most                  recent 02/12/2020. CHF, CAD, Signs/Symptoms:Shortness of Breath                  and Edema; Risk Factors:Hypertension, Dyslipidemia and Sleep                  Apnea.  Sonographer:     Charmayne Sheer Referring Phys:  [place] Diagnosing Phys: Isaias Cowman MD IMPRESSIONS  1. Left ventricular ejection fraction, by estimation, is 45 to 50%. The left ventricle has mildly decreased  function. The left ventricle has no regional wall motion abnormalities. Left ventricular diastolic parameters are indeterminate.  2. Right ventricular systolic function is normal. The right ventricular size is normal.  3. A small pericardial effusion is present.  4. The mitral valve is normal in structure. Mild mitral valve regurgitation. No evidence of mitral stenosis.  5. The aortic valve is normal in structure. Aortic valve regurgitation is not visualized. No aortic stenosis is present.  6. The inferior vena cava is normal in size with greater than 50% respiratory variability, suggesting right atrial pressure of 3 mmHg. FINDINGS  Left Ventricle: Left ventricular ejection fraction, by estimation, is 45 to 50%. The left ventricle has mildly decreased function. The left ventricle has no regional wall motion abnormalities. The left ventricular internal cavity size was normal in size. There is no left ventricular hypertrophy. Left ventricular diastolic parameters are indeterminate. Right Ventricle: The right ventricular size is normal. No increase in right ventricular wall thickness. Right ventricular systolic function is normal. Left Atrium: Left atrial size was normal in size. Right Atrium: Right atrial size was normal in size. Pericardium: A small pericardial effusion is present. Mitral Valve: The mitral valve is normal in structure. Mild mitral valve regurgitation. No evidence of mitral valve stenosis. MV peak gradient, 4.8 mmHg. The mean mitral valve gradient is 2.0 mmHg. Tricuspid Valve: The tricuspid valve is normal in structure. Tricuspid valve regurgitation is mild . No evidence of tricuspid stenosis. Aortic Valve: The aortic valve is normal in structure. Aortic valve regurgitation is not visualized. No aortic stenosis is present. Aortic valve mean gradient measures 4.0 mmHg. Aortic valve peak gradient measures 6.5 mmHg. Aortic valve area, by VTI measures 1.89 cm. Pulmonic Valve: The pulmonic valve was  normal in structure. Pulmonic valve regurgitation is not visualized. No evidence of pulmonic stenosis. Aorta: The aortic root is normal in size and structure. Venous: The inferior vena cava is normal in size with greater than 50% respiratory variability, suggesting right atrial pressure of 3 mmHg. IAS/Shunts: No atrial level shunt detected by color flow Doppler.  LEFT VENTRICLE PLAX 2D LVIDd:         4.50 cm   Diastology LVIDs:         2.98 cm   LV e' medial:    7.40 cm/s LV PW:         1.24 cm   LV E/e' medial:  14.3 LV IVS:        0.75 cm   LV e' lateral:   6.74 cm/s LVOT diam:     1.90 cm   LV E/e' lateral: 15.7 LV SV:  44 LV SV Index:   24 LVOT Area:     2.84 cm  RIGHT VENTRICLE RV Basal diam:  3.59 cm LEFT ATRIUM           Index        RIGHT ATRIUM           Index LA diam:      5.90 cm 3.28 cm/m   RA Area:     22.00 cm LA Vol (A2C): 41.5 ml 23.07 ml/m  RA Volume:   58.70 ml  32.63 ml/m LA Vol (A4C): 84.1 ml 46.74 ml/m  AORTIC VALVE                    PULMONIC VALVE AV Area (Vmax):    1.90 cm     PV Vmax:       0.66 m/s AV Area (Vmean):   1.88 cm     PV Vmean:      44.600 cm/s AV Area (VTI):     1.89 cm     PV VTI:        0.102 m AV Vmax:           127.00 cm/s  PV Peak grad:  1.7 mmHg AV Vmean:          88.200 cm/s  PV Mean grad:  1.0 mmHg AV VTI:            0.231 m AV Peak Grad:      6.5 mmHg AV Mean Grad:      4.0 mmHg LVOT Vmax:         84.90 cm/s LVOT Vmean:        58.500 cm/s LVOT VTI:          0.154 m LVOT/AV VTI ratio: 0.67  AORTA Ao Root diam: 2.80 cm MITRAL VALVE                TRICUSPID VALVE MV Area (PHT): 5.22 cm     TR Peak grad:   29.8 mmHg MV Area VTI:   1.51 cm     TR Vmax:        273.00 cm/s MV Peak grad:  4.8 mmHg MV Mean grad:  2.0 mmHg     SHUNTS MV Vmax:       1.09 m/s     Systemic VTI:  0.15 m MV Vmean:      53.9 cm/s    Systemic Diam: 1.90 cm MV Decel Time: 145 msec MV E velocity: 105.67 cm/s Isaias Cowman MD Electronically signed by Isaias Cowman MD Signature  Date/Time: 10/16/2020/1:32:50 PM    Final       Subjective: Episodes of shortness of breath and tachycardia Stable on my evaluation this morning Tachycardic this afternoon in the setting of beta-blocker being held secondary to soft blood pressure Of note patient tolerated medication yesterday maintaining blood pressure with correction heart rate  Objective: Vitals:   10/19/20 0636 10/19/20 0741 10/19/20 1125 10/19/20 1559  BP:  94/66 115/60 (!) 99/52  Pulse:  87 99 100  Resp:  17 17   Temp:  98 F (36.7 C) 98.5 F (36.9 C) 97.6 F (36.4 C)  TempSrc:   Oral   SpO2:  99% 94% 94%  Weight: 75.2 kg     Height:        Intake/Output Summary (Last 24 hours) at 10/19/2020 1605 Last data filed at 10/19/2020 1409 Gross per 24 hour  Intake 240 ml  Output 850  ml  Net -610 ml   Filed Weights   10/15/20 1907 10/18/20 0435 10/19/20 0636  Weight: 72.6 kg 75.2 kg 75.2 kg    Examination:  General exam: Appears calm and comfortable  Respiratory system: Scattered wheezing and rhonchi Cardiovascular system: Irregularly irregular Gastrointestinal system: Abdomen is nondistended, soft and nontender. No organomegaly or masses felt. Normal bowel sounds heard. Central nervous system: Alert and oriented. No focal neurological deficits. Extremities: Thin but warm well perfused Skin: No rashes, lesions or ulcers Psychiatry: Judgement and insight appear normal. Mood & affect appropriate.     Data Reviewed: I have personally reviewed following labs and imaging studies  CBC: Recent Labs  Lab 10/15/20 2026 10/16/20 0646 10/17/20 0622 10/18/20 0450 10/19/20 0522  WBC 9.7 7.9 7.1 7.2 11.1*  NEUTROABS 7.8*  --   --   --   --   HGB 10.1* 9.2* 9.3* 9.0* 9.9*  HCT 37.4 33.0* 35.3* 32.5* 37.1  MCV 80.6 80.1 79.9* 78.9* 81.0  PLT 270 235 212 181 196   Basic Metabolic Panel: Recent Labs  Lab 10/15/20 2026 10/16/20 0646 10/17/20 0622 10/18/20 0450 10/19/20 0522  NA 139 141 140 139 137   K 4.2 3.9 3.5 4.3 4.6  CL 100 101 96* 98 95*  CO2 35* 33* 36* 36* 38*  GLUCOSE 119* 125* 100* 122* 124*  BUN 35* 34* 25* 22 26*  CREATININE 1.13* 1.07* 1.11* 0.97 0.92  CALCIUM 8.6* 8.5* 8.3* 8.6* 9.1  MG  --  2.0 1.8 1.8 2.2  PHOS  --  3.2 3.9 2.4* 3.5   GFR: Estimated Creatinine Clearance: 53.6 mL/min (by C-G formula based on SCr of 0.92 mg/dL). Liver Function Tests: Recent Labs  Lab 10/15/20 2026  AST 20  ALT 17  ALKPHOS 66  BILITOT 0.8  PROT 5.9*  ALBUMIN 3.5   No results for input(s): LIPASE, AMYLASE in the last 168 hours. No results for input(s): AMMONIA in the last 168 hours. Coagulation Profile: Recent Labs  Lab 10/15/20 2026  INR 1.9*   Cardiac Enzymes: No results for input(s): CKTOTAL, CKMB, CKMBINDEX, TROPONINI in the last 168 hours. BNP (last 3 results) No results for input(s): PROBNP in the last 8760 hours. HbA1C: No results for input(s): HGBA1C in the last 72 hours. CBG: Recent Labs  Lab 10/16/20 1635 10/16/20 2016  GLUCAP 176* 108*   Lipid Profile: No results for input(s): CHOL, HDL, LDLCALC, TRIG, CHOLHDL, LDLDIRECT in the last 72 hours. Thyroid Function Tests: No results for input(s): TSH, T4TOTAL, FREET4, T3FREE, THYROIDAB in the last 72 hours. Anemia Panel: No results for input(s): VITAMINB12, FOLATE, FERRITIN, TIBC, IRON, RETICCTPCT in the last 72 hours. Sepsis Labs: Recent Labs  Lab 10/15/20 2026 10/15/20 2049 10/15/20 2216 10/16/20 0646 10/17/20 0622  PROCALCITON <0.10  --   --  <0.10 <0.10  LATICACIDVEN  --  1.0 1.0 1.3  --     Recent Results (from the past 240 hour(s))  Blood culture (routine single)     Status: None (Preliminary result)   Collection Time: 10/15/20  8:49 PM   Specimen: BLOOD  Result Value Ref Range Status   Specimen Description BLOOD RIGHT ANTECUBITAL  Final   Special Requests   Final    BOTTLES DRAWN AEROBIC AND ANAEROBIC Blood Culture adequate volume   Culture   Final    NO GROWTH 4 DAYS Performed at  [facility], [address], [place], [place] [address]    Report Status PENDING  Incomplete  Urine  Culture     Status: None   Collection Time: 10/15/20  8:50 PM   Specimen: Urine, Random  Result Value Ref Range Status   Specimen Description   Final    URINE, RANDOM Performed at [facility], [address] [place], [place] [address]    Special Requests   Final    NONE Performed at [facility], [address], [place], [place] [address]    Culture   Final    NO GROWTH Performed at [facility], [place] [address], [place], [place] [address]    Report Status 10/17/2020 FINAL  Final  Resp Panel by RT-PCR (Flu A&B, Covid) Nasopharyngeal Swab     Status: None   Collection Time: 10/15/20  8:50 PM   Specimen: Nasopharyngeal Swab; Nasopharyngeal(NP) swabs in vial transport medium  Result Value Ref Range Status   SARS Coronavirus 2 by RT PCR NEGATIVE NEGATIVE Final    Comment: (NOTE) SARS-CoV-2 target nucleic acids are NOT DETECTED.  The SARS-CoV-2 RNA is generally detectable in upper respiratory specimens during the acute phase of infection. The lowest concentration of SARS-CoV-2 viral copies this assay can detect is 138 copies/mL. A negative result does not preclude SARS-Cov-2 infection and should not be used as the sole basis for treatment or other patient management decisions. A negative result may occur with  improper specimen collection/handling, submission of specimen other than nasopharyngeal swab, presence of viral mutation(s) within the areas targeted by this assay, and inadequate number of viral copies(<138 copies/mL). A negative result must be combined with clinical observations, patient history, and epidemiological information. The expected result is Negative.  Fact Sheet for Patients:  EntrepreneurPulse.com.au  Fact Sheet for Healthcare Providers:  IncredibleEmployment.be  This test is no t yet  approved or cleared by the [place] FDA and  has been authorized for detection and/or diagnosis of SARS-CoV-2 by FDA under an Emergency Use Authorization (EUA). This EUA will remain  in effect (meaning this test can be used) for the duration of the COVID-19 declaration under Section 564(b)(1) of the Act, 21 U.S.C.section 360bbb-3(b)(1), unless the authorization is terminated  or revoked sooner.       Influenza A by PCR NEGATIVE NEGATIVE Final   Influenza B by PCR NEGATIVE NEGATIVE Final    Comment: (NOTE) The Xpert Xpress SARS-CoV-2/FLU/RSV plus assay is intended as an aid in the diagnosis of influenza from Nasopharyngeal swab specimens and should not be used as a sole basis for treatment. Nasal washings and aspirates are unacceptable for Xpert Xpress SARS-CoV-2/FLU/RSV testing.  Fact Sheet for Patients: EntrepreneurPulse.com.au  Fact Sheet for Healthcare Providers: IncredibleEmployment.be  This test is not yet approved or cleared by the [place] FDA and has been authorized for detection and/or diagnosis of SARS-CoV-2 by FDA under an Emergency Use Authorization (EUA). This EUA will remain in effect (meaning this test can be used) for the duration of the COVID-19 declaration under Section 564(b)(1) of the Act, 21 U.S.C. section 360bbb-3(b)(1), unless the authorization is terminated or revoked.  Performed at [facility], [address], [place], [place] [address]          Radiology Studies: [facility] Chest [place] 1 View  Result Date: 10/19/2020 CLINICAL DATA:  Shortness of breath, productive cough. EXAM: PORTABLE CHEST 1 VIEW COMPARISON:  Chest x-rays dated 10/16/2020 and 10/15/2020. FINDINGS: Increasing bilateral perihilar and bibasilar airspace opacities. Stable cardiomegaly. No pneumothorax is seen. Suspect small bilateral pleural effusions. IMPRESSION: Increasing bilateral perihilar and bibasilar airspace opacities,  compatible with multifocal pneumonia versus pulmonary edema. Probable small bilateral pleural effusions. Electronically Signed   By: Franki Cabot M.D.   On: 10/19/2020 09:23        Scheduled Meds:  clopidogrel  75 mg Oral Daily   cyanocobalamin  1,000 mcg Intramuscular Daily   Followed by   Derrill Memo ON 10/24/2020] vitamin B-12  500 mcg Oral Daily   famotidine  20 mg Oral BID   FLUoxetine  20 mg Oral Daily   fluticasone furoate-vilanterol  1 puff Inhalation Daily   folic acid  1 mg Oral Daily   furosemide  20 mg Intravenous BID   gabapentin  600 mg Oral QHS   ipratropium-albuterol  3 mL Nebulization Q6H   [START ON 10/22/2020] iron polysaccharides  150 mg Oral Daily   metoprolol tartrate  12.5 mg Oral BID   pantoprazole  40 mg Oral Daily   rivaroxaban  20 mg Oral Q supper   rosuvastatin  20 mg Oral Daily   Vitamin D (Ergocalciferol)  50,000 Units Oral Q7 days   Continuous Infusions:  iron sucrose 200 mg (10/19/20 0934)     LOS: 4 days    Time spent: [place], MD [facility]  If 7PM-7AM, please contact night-coverage  10/19/2020, 4:05 PM

## 2020-10-19 NOTE — Plan of Care (Signed)

## 2020-10-19 NOTE — Progress Notes (Signed)
Initial Nutrition Assessment RD working remotely.  DOCUMENTATION CODES:   Not applicable  INTERVENTION:  - will order Boost Breeze once/day, each supplement provides 250 kcal and 9 grams of protein. - will order Ensure Enlive once/day, each supplement provides 350 kcal and 20 grams of protein. - will order 1 tablet multivitamin with minerals/day. - complete NFPE at follow-up.    NUTRITION DIAGNOSIS:   Inadequate oral intake related to acute illness, decreased appetite as evidenced by per patient/family report.  GOAL:   Patient will meet greater than or equal to 90% of their needs  MONITOR:   PO intake, Supplement acceptance, Labs, Weight trends  REASON FOR ASSESSMENT:   Malnutrition Screening Tool    ASSESSMENT:   75 y.o with female with medical history of acid reflux, HLD, constipation, headaches, TIA, chronic back pain, urinary frequency, dysrhythmia, CAD, sleep apnea, anemia, afib, HTN, CHF, stroke, and cancer. She presented to the ED due to dizziness and generalized weakness for ~1 week and near syncopal episode.  Patient is noted to currently be out of the room to CT. She is noted to be a/o to person and place only.   Documented meal intakes are 0% of dinner last night, 50% of breakfast and 75% of lunch today.   She has not been seen by an inpatient  RD in the past but was being followed by Cardiac Rehab RD from April-June 2022.   Weight today is 166 lb and weight has been stable over the past 1 week, but up from 157 lb on 08/18/20. Generalized mild pitting edema documented in the edema section of flow sheet.    Labs reviewed; Cl: 95 mmol/l, BUN: 26 mg/dl,  Medications reviewed; 1000 mcg IM cyanocobalamin/day, 20 mg oral pepcid BID, 1 mg folvite/day, 20 mg IV lasix BID, 40 mg oral protonix/day, 50000 units drisdol/day.     NUTRITION - FOCUSED PHYSICAL EXAM:  Unable to complete at this time.   Diet Order:   Diet Order             Diet 2 gram  sodium Room service appropriate? Yes; Fluid consistency: Thin; Fluid restriction: 1500 mL Fluid  Diet effective now                   EDUCATION NEEDS:   No education needs have been identified at this time  Skin:  Skin Assessment: Skin Integrity Issues: Skin Integrity Issues:: Stage I Stage I: sacrum  Last BM:  PTA/unknown  Height:   Ht Readings from Last 1 Encounters:  10/15/20 5\' 5"  (1.651 m)    Weight:   Wt Readings from Last 1 Encounters:  10/19/20 75.2 kg     Estimated Nutritional Needs:  Kcal:  1600-1800 kcal Protein:  80-95 grams Fluid:  >/= 1.7 L/day      Jarome Matin, MS, RD, LDN, CNSC Inpatient Clinical Dietitian RD pager # available in [facility]  After hours/weekend pager # available in [facility]

## 2020-10-20 ENCOUNTER — Telehealth: Payer: Medicare Other

## 2020-10-20 DIAGNOSIS — G9341 Metabolic encephalopathy: Secondary | ICD-10-CM | POA: Diagnosis not present

## 2020-10-20 DIAGNOSIS — G629 Polyneuropathy, unspecified: Secondary | ICD-10-CM

## 2020-10-20 DIAGNOSIS — J9601 Acute respiratory failure with hypoxia: Secondary | ICD-10-CM | POA: Diagnosis not present

## 2020-10-20 DIAGNOSIS — I5021 Acute systolic (congestive) heart failure: Secondary | ICD-10-CM | POA: Diagnosis not present

## 2020-10-20 DIAGNOSIS — L8990 Pressure ulcer of unspecified site, unspecified stage: Secondary | ICD-10-CM | POA: Insufficient documentation

## 2020-10-20 LAB — CBC
HCT: 31.7 % — ABNORMAL LOW (ref 36.0–46.0)
Hemoglobin: 8.8 g/dL — ABNORMAL LOW (ref 12.0–15.0)
MCH: 22.3 pg — ABNORMAL LOW (ref 26.0–34.0)
MCHC: 27.8 g/dL — ABNORMAL LOW (ref 30.0–36.0)
MCV: 80.5 fL (ref 80.0–100.0)
Platelets: 184 K/uL (ref 150–400)
RBC: 3.94 MIL/uL (ref 3.87–5.11)
RDW: 17.8 % — ABNORMAL HIGH (ref 11.5–15.5)
WBC: 9 K/uL (ref 4.0–10.5)
nRBC: 0.4 % — ABNORMAL HIGH (ref 0.0–0.2)

## 2020-10-20 LAB — BASIC METABOLIC PANEL
Anion gap: 5 (ref 5–15)
BUN: 24 mg/dL — ABNORMAL HIGH (ref 8–23)
CO2: 40 mmol/L — ABNORMAL HIGH (ref 22–32)
Calcium: 8.7 mg/dL — ABNORMAL LOW (ref 8.9–10.3)
Chloride: 93 mmol/L — ABNORMAL LOW (ref 98–111)
Creatinine, Ser: 1.01 mg/dL — ABNORMAL HIGH (ref 0.44–1.00)
GFR, Estimated: 58 mL/min — ABNORMAL LOW (ref >60)
Glucose, Bld: 104 mg/dL — ABNORMAL HIGH (ref 70–99)
Potassium: 4.1 mmol/L (ref 3.5–5.1)
Sodium: 138 mmol/L (ref 135–145)

## 2020-10-20 LAB — CULTURE, BLOOD (SINGLE)
Culture: NO GROWTH
Special Requests: ADEQUATE

## 2020-10-20 MED ORDER — GABAPENTIN 300 MG PO CAPS
300.0000 mg | ORAL_CAPSULE | Freq: Three times a day (TID) | ORAL | Status: DC
  Administered 2020-10-20 – 2020-10-23 (×9): 300 mg via ORAL
  Filled 2020-10-20 (×9): qty 1

## 2020-10-20 NOTE — Care Management Important Message (Signed)
Important Message  Patient Details  Name: Kayla Maxwell MRN: [id number] Date of Birth: 1945/08/18   Medicare Important Message Given:  Yes     Dannette Barbara 10/20/2020, 2:29 PM

## 2020-10-20 NOTE — Progress Notes (Signed)
Physical Therapy Treatment Patient Details Name: Kayla Maxwell MRN: [id number] DOB: 12-22-1945 Today's Date: 10/20/2020   History of Present Illness 75 y.o with significant PMH H/o Severe PAD and PVD,  Bilateral lower extremity neuropathy, and DM who presented to the ED with chief complaints of dizziness, generalized weakness and near syncopal episode. Pt being tx'ed for acute respiratory failure with COPD and CHF exacerbation.    PT Comments    Pt received upright agreeable to PT services. Resting on 3 L/min via  with Spo2 initially at 86% and able to return to > 90% with cuing for PLB. RN present at beginning of session for medications prior to mobility. Seated therex performed during with good form/technique with cuing. Able to transfer to EOB with HOB elevated only requiring supervision and able to follow simple commands with 100% completion. Pt does require mod to max multimodal cuing throughout session however for safe hand use of transfers, use of RW/sequencing of RW during ambulation inside room around obstacles, and for PLB. Pt continently voided B/B and able to perform perihygiene with Kayla Maxwell from therapist and SUE support on RW with no LOB. HR elevated to 149 BPM and SPO2 at 86% with titration up to 6 L/min. Despite mod to max cuing for PLB Spo2 difficult to return past 87-88% however pt remains asymptomatic. Pt returned to recliner with all needs in reach and RN notified of session. Although pt progressing in tolerance with OOB mobility, pt Kayla Maxwell impulsive with deficits in poor safety awareness/use of RW increasing risk of falls. Will continue to benefit in STR to safely progress ambulation tolerance and safety with LRAD prior to transitioning back to home environment.   Recommendations for follow up therapy are one component of a multi-disciplinary discharge planning process, led by the attending physician.  Recommendations may be updated based on patient status, additional functional  criteria and insurance authorization.  Follow Up Recommendations  SNF;Supervision/Assistance - 24 hour     Equipment Recommendations  None recommended by PT    Recommendations for Other Services       Precautions / Restrictions Precautions Precautions: Fall Restrictions Weight Bearing Restrictions: No     Mobility  Bed Mobility Overal bed mobility: Needs Assistance Bed Mobility: Supine to Sit     Supine to sit: Supervision;HOB elevated     General bed mobility comments: Returned to recliner post session. Patient Response: Restless  Transfers Overall transfer level: Needs assistance Equipment used: Rolling walker (2 wheeled) Transfers: Sit to/from Stand Sit to Stand: Min guard         General transfer comment: Required multiple cues for safe hand placement with poor carryover. no buckling of LE's this session.  Ambulation/Gait Ambulation/Gait assistance: Min guard Gait Distance (Feet): 24 Feet (2x12' with toileting b/t bouts) Assistive device: Rolling walker (2 wheeled) Gait Pattern/deviations: Step-through pattern;Decreased step length - right;Decreased step length - left     General Gait Details: Remains needing multiple cues for safety awareness and use of RW. ABle to amb to toilet and contintently void bowel and bladder and able to perform perihygiene in standing with SUE on RW.   Stairs             Wheelchair Mobility    Modified Rankin (Stroke Patients Only)       Balance Overall balance assessment: Needs assistance Sitting-balance support: Feet supported   Sitting balance - Comments: able to sit with supervision   Standing balance support: Bilateral upper extremity supported;During functional activity Standing balance-Leahy Scale:  Fair Standing balance comment: Able to perform perihygiene with SUE on RW. Kayla Maxwell from PT.                            Cognition Arousal/Alertness: Awake/alert Behavior During Therapy:  Restless Overall Cognitive Status: No family/caregiver present to determine baseline cognitive functioning                                 General Comments: Kayla Maxwell remains with decreased safety awareness with poor use of Ue's on RW and with transfers, impulsively moving quickly.      Exercises General Exercises - Lower Extremity Long Arc Quad: AROM;Seated;Strengthening;Both;10 reps Hip Flexion/Marching: AROM;Strengthening;Seated;Both;10 reps Other Exercises Other Exercises: Assist in safe transfers to toilet for continent B/B void.    General Comments General comments (skin integrity, edema, etc.): HR elevated up to 149 BPM and SPO2 down to 85-86% on 6 L/min via [place] with short distances and toileting.      Pertinent Vitals/Pain Pain Assessment: No/denies pain    Home Living                      Prior Function            PT Goals (current goals can now be found in the care plan section) Acute Rehab PT Goals Patient Stated Goal: none stated Time For Goal Achievement: 11/01/20 Progress towards PT goals: Progressing toward goals    Frequency    Min 2X/week      PT Plan Current plan remains appropriate    Co-evaluation              AM-PAC PT "6 Clicks" Mobility   Outcome Measure  Help needed turning from your back to your side while in a flat bed without using bedrails?: A Lot Help needed moving from lying on your back to sitting on the side of a flat bed without using bedrails?: A Lot Help needed moving to and from a bed to a chair (including a wheelchair)?: A Little Help needed standing up from a chair using your arms (e.g., wheelchair or bedside chair)?: A Little Help needed to walk in hospital room?: A Little Help needed climbing 3-5 steps with a railing? : A Lot 6 Click Score: 15    End of Session Equipment Utilized During Treatment: Gait belt;Oxygen Activity Tolerance: Treatment limited secondary to medical complications  (Comment) Patient left: in chair;with call bell/phone within reach;with chair alarm set Nurse Communication: Mobility status PT Visit Diagnosis: Unsteadiness on feet (R26.81);Other abnormalities of gait and mobility (R26.89);Muscle weakness (generalized) (M62.81);Difficulty in walking, not elsewhere classified (R26.2);History of falling (Z91.81);Dizziness and giddiness (R42)     Time: 5277-8242 PT Time Calculation (min) (ACUTE ONLY): 34 min  Charges:  $Gait Training: 8-22 mins $Therapeutic Exercise: 8-22 mins                    Estelita Iten M. Fairly IV, PT, DPT Physical Therapist- [facility]  10/20/2020, 9:59 AM

## 2020-10-20 NOTE — Progress Notes (Addendum)
PROGRESS NOTE  Kayla Maxwell CXK:[id number] DOB: 12-Nov-1945 DOA: 10/15/2020 PCP: Gwyneth Sprout, FNP   LOS: 5 days   Brief narrative:  Patient is a 75 years old female with past medical history of atrial fibrillation, congestive heart failure, hyperlipidemia, hypertension and sleep apnea with history of a stroke and TIA presented to hospital with complaints of dizziness, generalized weakness and near syncopal episodes for 1 week.  She was also short of breath and required supplemental oxygen in route to the hospital.  She was initially noted to be hypoxic with pulse ox of 77% on 4 L of oxygen.  She did have her right foot which was erythematous and warm to touch concerning for possible cellulitis.  BNP was elevated at 967.  EKG showed atrial fibrillation.  Chest x-ray showed mild to moderate CHF.  Patient was then admitted hospital for possible pneumonia cellulitis and CHF exacerbation.  Assessment/Plan:  Active Problems:   Acute respiratory failure with hypoxia (HCC)   Pressure injury of skin  Acute Hypoxic Hypercapnic Respiratory Failure secondary to COPD and CHF exacerbation.  Past medical history of obstructive sleep apnea supposed to be on CPAP but not compliant with it.  Patient received IV Lasix and BiPAP during the nighttime.  Currently on supplemental oxygen during the day.  Continue inhalers. Will benefit from sleep study as outpatient.  We will continue to wean oxygen as able     Erythema of bilateral feet Cellulitis ruled out.  No sepsis.  Negative cultures.  Patient was initially azactam due to penicillin allergy which has been discontinued.      Acute on chronic systolic CHF NYHA class III: EF of 30% by echocardiogram in May 2022 ,-Hypertension Hx: CAD status post STEMI with PCI of LAD in 02/2020, HLD  Currently on Lasix at 20 mg twice daily.  Continue metoprolol as tolerated.  Lisinopril on hold due to AKI.  Recent echocardiogram showing LV ejection fraction of 45 to 50%.   Cardiology has been consulted for heart failure.    Acute Metabolic Encephalopathy due to Hypercapnia Improved with BiPAP and encouraged to use specially during napping and at nighttime..  Patient is noncompliant with CPAP at home.   Bilateral lower extremity neuropathy ABI within normal limits.  Ultrasound of the lower extremities negative for DVT.  Neuropathy could be secondary to vitamin B12 deficiency.  Patient's follows with Dr. Delana Meyer as outpatient.   Vitamin B12 deficiency, vitamin B12 level 128, on vitamin B12 1000 mcg IM injection daily for 7 days followed by oral supplement.  Will need to follow-up with PCP as outpatient.  Iron deficiency, iron saturation 4%, received Venofer during hospitalization will consider orals iron supplement on discharge.    Vitamin D insufficiency, continue vitamin D D 50,000 units p.o. weekly.  Follow with PCP for further management outpatient   Folic acid level 7.3, continue folic acid supplement.  Stage I sacral pressure ulceration.  Present on admission.  Continue preventive protocol. Pressure Injury 10/17/20 Sacrum Mid Stage 1 -  Intact skin with non-blanchable redness of a localized area usually over a bony prominence. red (Active)  10/17/20 1459  Location: Sacrum  Location Orientation: Mid  Staging: Stage 1 -  Intact skin with non-blanchable redness of a localized area usually over a bony prominence.  Wound Description (Comments): red  Present on Admission: Yes       Diabetes mellitus type II.  Continue sliding scale insulin Accu-Cheks diabetic diet.  Deconditioning, debility.  Patient has been seen by  physical therapy who recommended skilled nursing facility placement on discharge.  DVT prophylaxis:  rivaroxaban (XARELTO) tablet 20 mg    Code Status: DNR  Family Communication: Patient's daughter at bedside.  Status is: Inpatient  Disposition plan.  Skilled nursing facility as per PT  recommendations.  Consultants: Cardiology  Procedures: None  Anti-infectives:  None   Subjective: Today, patient was seen and examined at bedside.  States that she feels little better with breathing but denies any chest pain.  States that this is been having some cough with phlegm production.  Had a bowel movement and has been eating little better.  Objective: Vitals:   10/20/20 0728 10/20/20 0740  BP: 94/64 125/64  Pulse: (!) 108 (!) 108  Resp: 17 20  Temp: 98.4 F (36.9 C) 98.4 F (36.9 C)  SpO2: 99% 92%    Intake/Output Summary (Last 24 hours) at 10/20/2020 1054 Last data filed at 10/20/2020 0524 Gross per 24 hour  Intake 240 ml  Output 1175 ml  Net -935 ml   Filed Weights   10/18/20 0435 10/19/20 0636 10/20/20 0349  Weight: 75.2 kg 75.2 kg 74.8 kg   Body mass index is 27.42 kg/m.   Physical Exam: GENERAL: Patient is alert awake and oriented. Not in obvious distress.  On nasal cannula oxygen. HENT: No scleral pallor or icterus. Pupils equally reactive to light. Oral mucosa is moist NECK: is supple, no gross swelling noted. CHEST:  coarse breath sounds noted, diminished breath sounds bilaterally. CVS: S1 and S2 heard, no murmur.  Irregular rhythm ABDOMEN: Soft, non-tender, bowel sounds are present. EXTREMITIES: No edema. CNS: Cranial nerves are intact. No focal motor deficits. SKIN: warm and dry without rashes.  Data Review: I have personally reviewed the following laboratory data and studies,  CBC: Recent Labs  Lab 10/15/20 2026 10/16/20 0646 10/17/20 0622 10/18/20 0450 10/19/20 0522 10/20/20 0502  WBC 9.7 7.9 7.1 7.2 11.1* 9.0  NEUTROABS 7.8*  --   --   --   --   --   HGB 10.1* 9.2* 9.3* 9.0* 9.9* 8.8*  HCT 37.4 33.0* 35.3* 32.5* 37.1 31.7*  MCV 80.6 80.1 79.9* 78.9* 81.0 80.5  PLT 270 235 212 181 228 546   Basic Metabolic Panel: Recent Labs  Lab 10/16/20 0646 10/17/20 0622 10/18/20 0450 10/19/20 0522 10/20/20 0502  NA 141 140 139 137  138  K 3.9 3.5 4.3 4.6 4.1  CL 101 96* 98 95* 93*  CO2 33* 36* 36* 38* 40*  GLUCOSE 125* 100* 122* 124* 104*  BUN 34* 25* 22 26* 24*  CREATININE 1.07* 1.11* 0.97 0.92 1.01*  CALCIUM 8.5* 8.3* 8.6* 9.1 8.7*  MG 2.0 1.8 1.8 2.2  --   PHOS 3.2 3.9 2.4* 3.5  --    Liver Function Tests: Recent Labs  Lab 10/15/20 2026  AST 20  ALT 17  ALKPHOS 66  BILITOT 0.8  PROT 5.9*  ALBUMIN 3.5   No results for input(s): LIPASE, AMYLASE in the last 168 hours. No results for input(s): AMMONIA in the last 168 hours. Cardiac Enzymes: No results for input(s): CKTOTAL, CKMB, CKMBINDEX, TROPONINI in the last 168 hours. BNP (last 3 results) Recent Labs    02/12/20 1120 10/15/20 2026  BNP 794.8* 967.2*    ProBNP (last 3 results) No results for input(s): PROBNP in the last 8760 hours.  CBG: Recent Labs  Lab 10/16/20 1635 10/16/20 2016  GLUCAP 176* 108*   Recent Results (from the past 240 hour(s))  Blood  culture (routine single)     Status: None   Collection Time: 10/15/20  8:49 PM   Specimen: BLOOD  Result Value Ref Range Status   Specimen Description BLOOD RIGHT ANTECUBITAL  Final   Special Requests   Final    BOTTLES DRAWN AEROBIC AND ANAEROBIC Blood Culture adequate volume   Culture   Final    NO GROWTH 5 DAYS Performed at [facility], [address] [place], [place] [address]    Report Status 10/20/2020 FINAL  Final  Urine Culture     Status: None   Collection Time: 10/15/20  8:50 PM   Specimen: Urine, Random  Result Value Ref Range Status   Specimen Description   Final    URINE, RANDOM Performed at [facility], [address] [place], [place] [address]    Special Requests   Final    NONE Performed at [facility], [address], [place], [place] [address]    Culture   Final    NO GROWTH Performed at [facility], [place] [address] [place], [place] [address]    Report Status 10/17/2020 FINAL  Final  Resp Panel by RT-PCR (Flu  A&B, Covid) Nasopharyngeal Swab     Status: None   Collection Time: 10/15/20  8:50 PM   Specimen: Nasopharyngeal Swab; Nasopharyngeal(NP) swabs in vial transport medium  Result Value Ref Range Status   SARS Coronavirus 2 by RT PCR NEGATIVE NEGATIVE Final    Comment: (NOTE) SARS-CoV-2 target nucleic acids are NOT DETECTED.  The SARS-CoV-2 RNA is generally detectable in upper respiratory specimens during the acute phase of infection. The lowest concentration of SARS-CoV-2 viral copies this assay can detect is 138 copies/mL. A negative result does not preclude SARS-Cov-2 infection and should not be used as the sole basis for treatment or other patient management decisions. A negative result may occur with  improper specimen collection/handling, submission of specimen other than nasopharyngeal swab, presence of viral mutation(s) within the areas targeted by this assay, and inadequate number of viral copies(<138 copies/mL). A negative result must be combined with clinical observations, patient history, and epidemiological information. The expected result is Negative.  Fact Sheet for Patients:  EntrepreneurPulse.com.au  Fact Sheet for Healthcare Providers:  IncredibleEmployment.be  This test is no t yet approved or cleared by the [place] FDA and  has been authorized for detection and/or diagnosis of SARS-CoV-2 by FDA under an Emergency Use Authorization (EUA). This EUA will remain  in effect (meaning this test can be used) for the duration of the COVID-19 declaration under Section 564(b)(1) of the Act, 21 U.S.C.section 360bbb-3(b)(1), unless the authorization is terminated  or revoked sooner.       Influenza A by PCR NEGATIVE NEGATIVE Final   Influenza B by PCR NEGATIVE NEGATIVE Final    Comment: (NOTE) The Xpert Xpress SARS-CoV-2/FLU/RSV plus assay is intended as an aid in the diagnosis of influenza from Nasopharyngeal swab specimens  and should not be used as a sole basis for treatment. Nasal washings and aspirates are unacceptable for Xpert Xpress SARS-CoV-2/FLU/RSV testing.  Fact Sheet for Patients: EntrepreneurPulse.com.au  Fact Sheet for Healthcare Providers: IncredibleEmployment.be  This test is not yet approved or cleared by the [place] FDA and has been authorized for detection and/or diagnosis of SARS-CoV-2 by FDA under an Emergency Use Authorization (EUA). This EUA will remain in effect (meaning this test can be used) for the duration of the COVID-19 declaration under Section 564(b)(1) of the Act,  21 U.S.C. section 360bbb-3(b)(1), unless the authorization is terminated or revoked.  Performed at [facility], [address] [place], [place] [address]      Studies: CT CHEST WO CONTRAST  Result Date: 10/19/2020 CLINICAL DATA:  Respiratory failure EXAM: CT CHEST WITHOUT CONTRAST TECHNIQUE: Multidetector CT imaging of the chest was performed following the standard protocol without IV contrast. COMPARISON:  None. FINDINGS: Cardiovascular: Aortic atherosclerosis. Cardiomegaly. Three-vessel coronary artery calcifications and/or stents. Small pericardial effusion. Mediastinum/Nodes: No enlarged mediastinal, hilar, or axillary lymph nodes. Thyroid gland, trachea, and esophagus demonstrate no significant findings. Lungs/Pleura: Large right, moderate left pleural effusions and associated atelectasis or consolidation. Moderate centrilobular emphysema. Upper Abdomen: No acute abnormality. Musculoskeletal: No chest wall mass or suspicious bone lesions identified. Calcified bilateral prepectoral breast implants. IMPRESSION: 1. Large right, moderate left pleural effusions and associated atelectasis or consolidation. 2. Moderate centrilobular emphysema. 3. Cardiomegaly and coronary artery disease. Small pericardial effusion. Aortic Atherosclerosis (ICD10-I70.0) and Emphysema  (ICD10-J43.9). Electronically Signed   By: Delanna Ahmadi M.D.   On: 10/19/2020 16:11   DG Chest Port 1 View  Result Date: 10/19/2020 CLINICAL DATA:  Shortness of breath, productive cough. EXAM: PORTABLE CHEST 1 VIEW COMPARISON:  Chest x-rays dated 10/16/2020 and 10/15/2020. FINDINGS: Increasing bilateral perihilar and bibasilar airspace opacities. Stable cardiomegaly. No pneumothorax is seen. Suspect small bilateral pleural effusions. IMPRESSION: Increasing bilateral perihilar and bibasilar airspace opacities, compatible with multifocal pneumonia versus pulmonary edema. Probable small bilateral pleural effusions. Electronically Signed   By: Franki Cabot M.D.   On: 10/19/2020 09:23      Flora Lipps, MD  [facility] 10/20/2020  If 7PM-7AM, please contact night-coverage

## 2020-10-20 NOTE — TOC Initial Note (Signed)
Transition of Care [facility]) - Initial/Assessment Note    Patient Details  Name: Kayla Maxwell MRN: [id number] Date of Birth: [birth year]  Transition [facility]) CM/SW Contact:    Alberteen Sam, LCSW Phone Number: 10/20/2020, 10:09 AM  Clinical Narrative:                  CSW spoke with patient's daughter [name], informed of PT/OT recs. [name] is in agreement with SNF placement for patient, with preference of [facility].   CS[facility] has sent referral to [facility], pending bed offer at this time.   Expected Discharge Plan: Skilled Nursing Facility Barriers to Discharge: Continued Medical Work up   Patient Goals and CMS Choice Patient states their goals for this hospitalization and ongoing recovery are:: to go home CMS Medicare.gov Compare Post Acute Care list provided to:: Patient Represenative (must comment) (daughter [name]) Choice offered to / list presented to : Adult Children  Expected Discharge Plan and Services Expected Discharge Plan: [place] In-house Referral: NA   Post Acute Care Choice: [place] Living arrangements for the past 2 months: Single Family Home                                      Prior Living Arrangements/Services Living arrangements for the past 2 months: Single Family Home Lives with:: Self Patient language and need for interpreter reviewed:: Yes Do you feel safe going back to the place where you live?: Yes      Need for Family Participation in Patient Care: Yes (Comment) Care giver support system in place?: Yes (comment)   Criminal Activity/Legal Involvement Pertinent to Current Situation/Hospitalization: No - Comment as needed  Activities of Daily Living Home Assistive Devices/Equipment: Cane (specify quad or straight), Walker (specify type), Shower chair with back, CPAP ADL Screening (condition at time of admission) Patient's cognitive ability adequate to safely complete daily activities?: Yes Is the  patient deaf or have difficulty hearing?: No Does the patient have difficulty seeing, even when wearing glasses/contacts?: No Does the patient have difficulty concentrating, remembering, or making decisions?: No Patient able to express need for assistance with ADLs?: Yes Does the patient have difficulty dressing or bathing?: Yes Independently performs ADLs?: No Communication: Independent Dressing (OT): Needs assistance Is this a change from baseline?: Pre-admission baseline Grooming: Needs assistance Is this a change from baseline?: Pre-admission baseline Feeding: Independent Bathing: Needs assistance Is this a change from baseline?: Pre-admission baseline Toileting: Needs assistance Is this a change from baseline?: Pre-admission baseline In/Out Bed: Independent Walks in Home: Independent with device (comment) Does the patient have difficulty walking or climbing stairs?: Yes Weakness of Legs: Both Weakness of Arms/Hands: Both  Permission Sought/Granted Permission sought to share information with : Case Manager, Customer service manager, Family Supports Permission granted to share information with : Yes, Release of Information Signed  Share Information with NAME: [name]  Permission granted to share info w AGENCY: SNFs  Permission granted to share info w Relationship: daughter  Permission granted to share info w Contact Information: [phone]  Emotional Assessment Appearance:: Appears stated age Attitude/Demeanor/Rapport: Unable to Assess Affect (typically observed): Unable to Assess Orientation: : Oriented to Self, Oriented to  Time, Oriented to Place Alcohol / Substance Use: Not Applicable Psych Involvement: No (comment)  Admission diagnosis:  PVD (peripheral vascular disease) ([place]) [I73.9] Shock circulatory (HCC) [R57.9] PAD (peripheral artery disease) ([place]) [I73.9] Cellulitis of right  foot [E83.151] Cellulitis of right lower extremity [L03.115] Acute  respiratory failure with hypoxia (HCC) [J96.01] Suspected deep vein thrombosis (DVT) [R09.89] Redness and swelling of lower leg [M79.89, R23.8] Patient Active Problem List   Diagnosis Date Noted   Pressure injury of skin 10/20/2020   Acute respiratory failure with hypoxia ([place]) 10/15/2020   Neuropathy of both feet 08/18/2020   Decreased activities of daily living (ADL) 76/16/0737   Acute systolic CHF (congestive heart failure) ([place]) 03/20/2020   Venous insufficiency of both lower extremities 11/02/2017   Benign essential HTN 11/01/2016   PAD (peripheral artery disease) ([place]) 01/14/2016   Edema 09/05/2014   Atrial fibrillation by electrocardiography ([place]) 04/29/2014   PCP:  Gwyneth Sprout, FNP Pharmacy:   [id number] [place], [place] [address] [place] [address] Phone: [phone] Fax: [phone]     Social Determinants of Health (SDOH) Interventions    Readmission Risk Interventions No flowsheet data found.

## 2020-10-21 ENCOUNTER — Inpatient Hospital Stay: Payer: Medicare Other

## 2020-10-21 DIAGNOSIS — R579 Shock, unspecified: Secondary | ICD-10-CM

## 2020-10-21 DIAGNOSIS — I739 Peripheral vascular disease, unspecified: Secondary | ICD-10-CM

## 2020-10-21 DIAGNOSIS — J9601 Acute respiratory failure with hypoxia: Secondary | ICD-10-CM | POA: Diagnosis not present

## 2020-10-21 DIAGNOSIS — R0600 Dyspnea, unspecified: Secondary | ICD-10-CM | POA: Diagnosis not present

## 2020-10-21 DIAGNOSIS — J90 Pleural effusion, not elsewhere classified: Secondary | ICD-10-CM

## 2020-10-21 DIAGNOSIS — L03115 Cellulitis of right lower limb: Secondary | ICD-10-CM | POA: Diagnosis not present

## 2020-10-21 LAB — BASIC METABOLIC PANEL
Anion gap: 8 (ref 5–15)
BUN: 29 mg/dL — ABNORMAL HIGH (ref 8–23)
CO2: 38 mmol/L — ABNORMAL HIGH (ref 22–32)
Calcium: 8.4 mg/dL — ABNORMAL LOW (ref 8.9–10.3)
Chloride: 92 mmol/L — ABNORMAL LOW (ref 98–111)
Creatinine, Ser: 1.08 mg/dL — ABNORMAL HIGH (ref 0.44–1.00)
GFR, Estimated: 54 mL/min — ABNORMAL LOW (ref >60)
Glucose, Bld: 81 mg/dL (ref 70–99)
Potassium: 3.9 mmol/L (ref 3.5–5.1)
Sodium: 138 mmol/L (ref 135–145)

## 2020-10-21 LAB — CBC
HCT: 32.7 % — ABNORMAL LOW (ref 36.0–46.0)
Hemoglobin: 8.7 g/dL — ABNORMAL LOW (ref 12.0–15.0)
MCH: 21.6 pg — ABNORMAL LOW (ref 26.0–34.0)
MCHC: 26.6 g/dL — ABNORMAL LOW (ref 30.0–36.0)
MCV: 81.1 fL (ref 80.0–100.0)
Platelets: 168 10*3/uL (ref 150–400)
RBC: 4.03 MIL/uL (ref 3.87–5.11)
RDW: 18.7 % — ABNORMAL HIGH (ref 11.5–15.5)
WBC: 8.6 10*3/uL (ref 4.0–10.5)
nRBC: 0.6 % — ABNORMAL HIGH (ref 0.0–0.2)

## 2020-10-21 MED ORDER — BOOST / RESOURCE BREEZE PO LIQD CUSTOM
1.0000 | Freq: Three times a day (TID) | ORAL | Status: DC
  Administered 2020-10-21 – 2020-10-23 (×4): 1 via ORAL

## 2020-10-21 NOTE — Progress Notes (Signed)
PROGRESS NOTE  Kayla Maxwell ZOX:[id number] DOB: Aug 29, 1945 DOA: 10/15/2020 PCP: Gwyneth Sprout, FNP   LOS: 6 days   Brief narrative:  Patient is a 75 years old female with past medical history of atrial fibrillation, congestive heart failure, COPD, hyperlipidemia, hypertension and sleep apnea with history of a stroke and TIA presented to the hospital with complaints of dizziness, generalized weakness and near syncopal episodes for 1 week.  She was also short of breath and required supplemental oxygen enroute to the hospital.  She was initially noted to be hypoxic with pulse ox of 77% on 4 L of oxygen.  She did have her right foot which was erythematous and warm to touch concerning for possible cellulitis.  BNP was elevated at 967.  EKG showed atrial fibrillation.  Chest x-ray showed mild to moderate CHF.  Patient was then admitted hospital for possible pneumonia, cellulitis and CHF/COPD exacerbation.  During hospitalization, patient has received IV Lasix and BiPAP at nighttime and has been continued on Lasix.  She is a [place] on supplemental oxygen but has continued to feel better.  She is currently awaiting for skilled nursing facility placement.  Assessment/Plan:  Active Problems:   Acute respiratory failure with hypoxia (HCC)   Pressure injury of skin  Acute Hypoxic/ Hypercapnic Respiratory Failure secondary to COPD and CHF exacerbation with pleural effusion.   Past medical history of obstructive sleep apnea supposed to be on CPAP but not compliant with it.  Patient has received IV Lasix and BiPAP during the nighttime.  Currently on supplemental oxygen during the day.  Continue nebulizers, diuretics.  Will benefit from sleep study as outpatient.  We will continue to wean oxygen as able.  X-ray from today shows a slightly decreased effusion and improvement in the chest x-ray.    Erythema of bilateral feet Cellulitis ruled out.  No sepsis.  Negative cultures.  Patient was initially azactam  due to penicillin allergy which has been discontinued.      Acute on chronic systolic CHF NYHA class III: EF of 30% by echocardiogram in May 2022 , -Hypertension. Hx: CAD status post STEMI with PCI of LAD in 02/2020,  Hyperlipidemia Currently on Lasix at 20 mg IV twice daily.  Tolerating well.  Continue metoprolol as tolerated.  Lisinopril on hold due to AKI but will initiate today due to stable creatinine levels.  Start with low-dose.  Recent echocardiogram from 10/16/2020 showing LV ejection fraction of 45 to 50%.  Cardiology was consulted.  Patient takes Lasix 20 mg daily as needed at home which might need to be changed on discharge.  Patient is negative balance for 844 mL. Going slowly with diuretics on her.    Acute Metabolic Encephalopathy due to Hypercapnia Improved with BiPAP and encouraged to use specially during napping and at nighttime.  Patient is noncompliant with CPAP at home will need a new sleep study prior to having the CPAP machine again..   Bilateral lower extremity neuropathy ABI within normal limits.  Ultrasound of the lower extremities negative for DVT.  Neuropathy could be secondary to vitamin B12 deficiency.  Patient's follows with Dr. Delana Meyer as outpatient.  Continue vitamin B12 supplements, maintain.   Vitamin B12 deficiency, vitamin B12 level 128, on vitamin B12 1000 mcg IM injection daily for 7 days followed by oral supplement.  Will need to follow-up with PCP as outpatient.  Iron deficiency, iron saturation 4%, received Venofer during hospitalization and will be transition to oral and continue on discharge.  Vitamin D  insufficiency, continue vitamin D 50,000 units p.o. weekly.  Follow with PCP for further management outpatient   Folic acid level 7.3, Borderline low range.  Continue folic acid supplement.  Stage I sacral pressure ulceration.  Present on admission.  Continue preventive protocol. Pressure Injury 10/17/20 Sacrum Mid Stage 1 -  Intact skin with  non-blanchable redness of a localized area usually over a bony prominence. red (Active)  10/17/20 1459  Location: Sacrum  Location Orientation: Mid  Staging: Stage 1 -  Intact skin with non-blanchable redness of a localized area usually over a bony prominence.  Wound Description (Comments): red  Present on Admission: Yes      Diabetes mellitus type II.  Not on medications at home.  Likely diet controlled.  Continue continue sliding scale insulin Accu-Cheks diabetic diet.  Latest hemoglobin A1c 4 months back was 5.9.  Deconditioning, debility.  Patient has been seen by physical therapy who recommended skilled nursing facility placement on discharge.  DVT prophylaxis:  rivaroxaban (XARELTO) tablet 20 mg   Code Status: DNR  Family Communication:  Spoke with the patient's daughter again today.  Status is: Inpatient  Disposition plan.   Skilled nursing facility as per PT recommendations.  Awaiting for skilled nursing facility placement.  Likely discharge in 1 to 2 days.  Patient is not medically stable so far due to need for IV diuretics, hypoxia on minimal exertion and room air.Could consider supplemental oxygen on discharge.  Consultants: Cardiology  Procedures: None  Anti-infectives:  None   Subjective: Today, patient was seen and examined at bedside.  Patient states that her shortness of breath has improved but has some cough with clear phlegm.  Sometimes with bloody sputum.  No chest pain.  Nursing staff reported that she gets short winded even on minimal activity in drops down as low as 70% while lying in bed.    Objective: Vitals:   10/21/20 0848 10/21/20 1054  BP: 134/83 117/65  Pulse: (!) 105 100  Resp: 18 17  Temp: 98.3 F (36.8 C) 98.1 F (36.7 C)  SpO2: 96% 95%    Intake/Output Summary (Last 24 hours) at 10/21/2020 1124 Last data filed at 10/21/2020 1056 Gross per 24 hour  Intake 1049.18 ml  Output 800 ml  Net 249.18 ml    Filed Weights   10/19/20  0636 10/20/20 0349 10/21/20 0240  Weight: 75.2 kg 74.8 kg 77.1 kg   Body mass index is 28.29 kg/m.   Physical Exam: GENERAL: Patient is alert awake and oriented. Not in obvious distress.  On nasal cannula oxygen. HENT: No scleral pallor or icterus. Pupils equally reactive to light. Oral mucosa is moist NECK: is supple, no gross swelling noted. CHEST: Diminished breath sounds bilaterally, coarse breath sounds noted.   CVS: S1 and S2 heard, no murmur.  Irregular rhythm ABDOMEN: Soft, non-tender, bowel sounds are present. EXTREMITIES: No edema. CNS: Cranial nerves are intact. No focal motor deficits. SKIN: warm and dry without rashes.  Data Review: I have personally reviewed the following laboratory data and studies,  CBC: Recent Labs  Lab 10/15/20 2026 10/16/20 0646 10/17/20 0622 10/18/20 0450 10/19/20 0522 10/20/20 0502 10/21/20 0452  WBC 9.7   < > 7.1 7.2 11.1* 9.0 8.6  NEUTROABS 7.8*  --   --   --   --   --   --   HGB 10.1*   < > 9.3* 9.0* 9.9* 8.8* 8.7*  HCT 37.4   < > 35.3* 32.5* 37.1 31.7* 32.7*  MCV  80.6   < > 79.9* 78.9* 81.0 80.5 81.1  PLT 270   < > 212 181 228 184 168   < > = values in this interval not displayed.    Basic Metabolic Panel: Recent Labs  Lab 10/16/20 0646 10/17/20 0622 10/18/20 0450 10/19/20 0522 10/20/20 0502 10/21/20 0452  NA 141 140 139 137 138 138  K 3.9 3.5 4.3 4.6 4.1 3.9  CL 101 96* 98 95* 93* 92*  CO2 33* 36* 36* 38* 40* 38*  GLUCOSE 125* 100* 122* 124* 104* 81  BUN 34* 25* 22 26* 24* 29*  CREATININE 1.07* 1.11* 0.97 0.92 1.01* 1.08*  CALCIUM 8.5* 8.3* 8.6* 9.1 8.7* 8.4*  MG 2.0 1.8 1.8 2.2  --   --   PHOS 3.2 3.9 2.4* 3.5  --   --     Liver Function Tests: Recent Labs  Lab 10/15/20 2026  AST 20  ALT 17  ALKPHOS 66  BILITOT 0.8  PROT 5.9*  ALBUMIN 3.5    No results for input(s): LIPASE, AMYLASE in the last 168 hours. No results for input(s): AMMONIA in the last 168 hours. Cardiac Enzymes: No results for input(s):  CKTOTAL, CKMB, CKMBINDEX, TROPONINI in the last 168 hours. BNP (last 3 results) Recent Labs    02/12/20 1120 10/15/20 2026  BNP 794.8* 967.2*     ProBNP (last 3 results) No results for input(s): PROBNP in the last 8760 hours.  CBG: Recent Labs  Lab 10/16/20 1635 10/16/20 2016  GLUCAP 176* 108*    Recent Results (from the past 240 hour(s))  Blood culture (routine single)     Status: None   Collection Time: 10/15/20  8:49 PM   Specimen: BLOOD  Result Value Ref Range Status   Specimen Description BLOOD RIGHT ANTECUBITAL  Final   Special Requests   Final    BOTTLES DRAWN AEROBIC AND ANAEROBIC Blood Culture adequate volume   Culture   Final    NO GROWTH 5 DAYS Performed at [facility], [address] [place], [place] [address]    Report Status 10/20/2020 FINAL  Final  Urine Culture     Status: None   Collection Time: 10/15/20  8:50 PM   Specimen: Urine, Random  Result Value Ref Range Status   Specimen Description   Final    URINE, RANDOM Performed at [facility], [address], [place], [place] [address]    Special Requests   Final    NONE Performed at [facility], [address] [place], [place] [address]    Culture   Final    NO GROWTH Performed at [facility], [place] [address] [place], [place] [address]    Report Status 10/17/2020 FINAL  Final  Resp Panel by RT-PCR (Flu A&B, Covid) Nasopharyngeal Swab     Status: None   Collection Time: 10/15/20  8:50 PM   Specimen: Nasopharyngeal Swab; Nasopharyngeal(NP) swabs in vial transport medium  Result Value Ref Range Status   SARS Coronavirus 2 by RT PCR NEGATIVE NEGATIVE Final    Comment: (NOTE) SARS-CoV-2 target nucleic acids are NOT DETECTED.  The SARS-CoV-2 RNA is generally detectable in upper respiratory specimens during the acute phase of infection. The lowest concentration of SARS-CoV-2 viral copies this assay can detect is 138 copies/mL. A negative result does not  preclude SARS-Cov-2 infection and should not be used as the sole basis for treatment or other patient management decisions. A negative result may occur with  improper specimen collection/handling, submission of specimen other than nasopharyngeal swab, presence of viral mutation(s) within the areas targeted by this assay, and inadequate number of viral copies(<138 copies/mL). A negative result must be combined with clinical observations, patient history, and epidemiological information. The expected result is Negative.  Fact Sheet for Patients:  EntrepreneurPulse.com.au  Fact Sheet for Healthcare Providers:  IncredibleEmployment.be  This test is no t yet approved or cleared by the [place] FDA and  has been authorized for detection and/or diagnosis of SARS-CoV-2 by FDA under an Emergency Use Authorization (EUA). This EUA will remain  in effect (meaning this test can be used) for the duration of the COVID-19 declaration under Section 564(b)(1) of the Act, 21 U.S.C.section 360bbb-3(b)(1), unless the authorization is terminated  or revoked sooner.       Influenza A by PCR NEGATIVE NEGATIVE Final   Influenza B by PCR NEGATIVE NEGATIVE Final    Comment: (NOTE) The Xpert Xpress SARS-CoV-2/FLU/RSV plus assay is intended as an aid in the diagnosis of influenza from Nasopharyngeal swab specimens and should not be used as a sole basis for treatment. Nasal washings and aspirates are unacceptable for Xpert Xpress SARS-CoV-2/FLU/RSV testing.  Fact Sheet for Patients: EntrepreneurPulse.com.au  Fact Sheet for Healthcare Providers: IncredibleEmployment.be  This test is not yet approved or cleared by the [place] FDA and has been authorized for detection and/or diagnosis of SARS-CoV-2 by FDA under an Emergency Use Authorization (EUA). This EUA will remain in effect (meaning this test can be used) for the  duration of the COVID-19 declaration under Section 564(b)(1) of the Act, 21 U.S.C. section 360bbb-3(b)(1), unless the authorization is terminated or revoked.  Performed at [facility], [address] [place], [place] [address]       Studies: CT CHEST WO CONTRAST  Result Date: 10/19/2020 CLINICAL DATA:  Respiratory failure EXAM: CT CHEST WITHOUT CONTRAST TECHNIQUE: Multidetector CT imaging of the chest was performed following the standard protocol without IV contrast. COMPARISON:  None. FINDINGS: Cardiovascular: Aortic atherosclerosis. Cardiomegaly. Three-vessel coronary artery calcifications and/or stents. Small pericardial effusion. Mediastinum/Nodes: No enlarged mediastinal, hilar, or axillary lymph nodes. Thyroid gland, trachea, and esophagus demonstrate no significant findings. Lungs/Pleura: Large right, moderate left pleural effusions and associated atelectasis or consolidation. Moderate centrilobular emphysema. Upper Abdomen: No acute abnormality. Musculoskeletal: No chest wall mass or suspicious bone lesions identified. Calcified bilateral prepectoral breast implants. IMPRESSION: 1. Large right, moderate left pleural effusions and associated atelectasis or consolidation. 2. Moderate centrilobular emphysema. 3. Cardiomegaly and coronary artery disease. Small pericardial effusion. Aortic Atherosclerosis (ICD10-I70.0) and Emphysema (ICD10-J43.9). Electronically Signed   By: Delanna Ahmadi M.D.   On: 10/19/2020 16:11     Flora Lipps, MD  [facility] 10/21/2020  If 7PM-7AM, please contact night-coverage

## 2020-10-21 NOTE — Evaluation (Signed)
Clinical/Bedside Swallow Evaluation Patient Details  Name: Kayla Maxwell MRN: [id number] Date of Birth: 19-Nov-1945  Today's Date: 10/21/2020 Time: SLP Start Time (ACUTE ONLY): 5 SLP Stop Time (ACUTE ONLY): 1510 SLP Time Calculation (min) (ACUTE ONLY): 55 min  Past Medical History:  Past Medical History:  Diagnosis Date   Acid reflux    takes Nexium daily   Allergy    takes Singulair daily as needed   Anemia    Atrial fibrillation (HCC)    Cancer (HCC) AGE 30   CERVICAL cancer with removal   CHF (congestive heart failure) (HCC)    Chronic back pain    stenosis   Complication of anesthesia    Constipation    takes Colace daily as needed   Coronary artery disease    Difficult intubation    Dysrhythmia    atrial fibrillation dx 04/2014   Headache    sinus    History of blood transfusion    History of shingles    Hyperlipidemia    takes Pravastatin daily-started taking this 6 months ago   Hypertension    Lumbar surgical wound fluid collection    Sleep apnea    CPAP   Stroke ([place])    TIA'S X 2 --- one last yr and one this yr...lost her memory over 4-6 hrs.   Stroke [facility]) 06-04-15   left with right side weakness   TIA (transient ischemic attack)    showed up on an MRI but she never knew it   Urinary frequency    Past Surgical History:  Past Surgical History:  Procedure Laterality Date   [place]  08/19/2014   Dr. Hal Neer ([place] otho)   [place]  2014   Dr Jamal Collin   COLONOSCOPY     CORONARY STENT INTERVENTION N/A 02/13/2020   Procedure: CORONARY STENT INTERVENTION;  Surgeon: Yolonda Kida, MD;  Location: [place] CV LAB;  Service: Cardiovascular;  Laterality: N/A;   ESOPHAGOGASTRODUODENOSCOPY     HAMMER TOE SURGERY Bilateral    LEFT HEART CATH AND CORONARY ANGIOGRAPHY N/A 02/13/2020   Procedure: LEFT HEART CATH AND CORONARY  ANGIOGRAPHY and possible PCI and stent;  Surgeon: Yolonda Kida, MD;  Location: [place] CV LAB;  Service: Cardiovascular;  Laterality: N/A;   LOWER EXTREMITY ANGIOGRAPHY Left 01/23/2019   Procedure: LOWER EXTREMITY ANGIOGRAPHY;  Surgeon: Katha Cabal, MD;  Location: [place] CV LAB;  Service: Cardiovascular;  Laterality: Left;   LOWER EXTREMITY ANGIOGRAPHY Left 02/20/2019   Procedure: LOWER EXTREMITY ANGIOGRAPHY;  Surgeon: Katha Cabal, MD;  Location:  CV LAB;  Service: Cardiovascular;  Laterality: Left;   LUMBAR LAMINECTOMY WITH COFLEX 1 LEVEL Bilateral 07/04/2014   Procedure: Laminectomy and Foraminotomy - bilateral - Lumbar Four-Five with coflex ;  Surgeon: Karie Chimera, MD;  Location: [place] NEURO ORS;  Service: Neurosurgery;  Laterality: Bilateral;   LUMBAR WOUND DEBRIDEMENT N/A 08/19/2014   Procedure: Exploration of Lumbar wound;  Surgeon: Karie Chimera, MD;  Location: [place] NEURO ORS;  Service: Neurosurgery;  Laterality: N/A;   OPEN REDUCTION INTERNAL FIXATION (ORIF) DISTAL RADIAL FRACTURE Right 07/18/2020   Procedure: OPEN REDUCTION INTERNAL FIXATION (ORIF) DISTAL RADIAL FRACTURE;  Surgeon: Hessie Knows, MD;  Location: [facility] ORS;  Service: Orthopedics;  Laterality: Right;   PERIPHERAL VASCULAR CATHETERIZATION Left 08/12/2015   Procedure: Lower Extremity Angiography;  Surgeon:  Katha Cabal, MD;  Location: [place] CV LAB;  Service: Cardiovascular;  Laterality: Left;   TONSILLECTOMY     VENTRAL HERNIA REPAIR N/A 05/26/2015   Procedure: HERNIA REPAIR VENTRAL ADULT;  Surgeon: Christene Lye, MD;  Location: [facility] ORS;  Service: General;  Laterality: N/A;   HPI:  75 y.o with significant PMH H/o Severe PAD and PVD, CAD, emphysema, Bilateral lower extremity neuropathy, and DM who presented to the ED with chief complaints of dizziness, generalized weakness and near syncopal episode. Pt being tx'ed for acute respiratory failure with COPD and CHF  exacerbation.  Chest CT: Large right, moderate left pleural effusions and associated  atelectasis or consolidation.  2. Moderate centrilobular emphysema.  3. Cardiomegaly and coronary artery disease. Small pericardial  effusion.    Assessment / Plan / Recommendation  Clinical Impression  Pt appears to present w/ adequate oropharyngeal phase swallow function w/ No oropharyngeal phase dysphagia noted, No neuromuscular deficits noted. Pt consumed po trials w/ No overt, clinical s/s of aspiration during po trials. Pt appears at reduced risk for aspiration following general aspiration precautions.   During po trials, pt consumed all consistencies w/ no overt coughing, decline in vocal quality, or change in respiratory presentation during/post trials. Oral phase appeared [facility] w/ timely bolus management, mastication, and control of bolus propulsion for A-P transfer for swallowing. Oral clearing achieved w/ all trial consistencies. Pt required min extra time for full mastication d/t missing Dentition. OM Exam appeared [facility] w/ no unilateral weakness noted. Pt fed self w/ setup support. Speech Clear. Pt was instructed on taking REST BREAKS during po intake to reduce any WOB w/ the exertion of eating/drinking d/t Baseline pulmonary decline.   OF NOTE: pt exhibited MOD-SEVERE Dysphonia; she does have reduced breath support at Baseline d/t her declined Pulmonary status including Emphysema. Pt has required BiPAP for O2 support d/t feeling "like I cannot get a full breath". Pt is concerned about the abrupt loss of her Voice ~2 days b/f admit to the hospital. She stated there has not been any improvement in her vocal quality -- noted same degree of Dysphonia w/ head turn/phonation to each side. She was unable to sustain clear "ah"; Choppy speech w/ loss of breath at ~3 words.     Recommend continue current Regular consistency diet w/ well-Cut meats, moistened foods; Thin liquids. Recommend general aspiration precautions,  Pills WHOLE in Puree for safer, easier swallowing as pt described Larger pills causing difficulty to swallow. Education given on Pills in Puree; food consistencies and easy to eat options; general aspiration precautions. Requested an ENT consult for decline in Voice and Dysphonia. NSG to reconsult if any new needs arise. NSG agreed. SLP Visit Diagnosis: Dysphagia, unspecified (R13.10)    Aspiration Risk   (reduced)    Diet Recommendation   Regular consistency diet w/ well-Cut meats, moistened foods; Thin liquids. Recommend general aspiration precautions,. Rest Breaks during meals to lessen any WOB w/ exertion. Foods easy to eat for conservation of energy.   Medication Administration: Whole meds with puree    Other  Recommendations Recommended Consults:  (Dietician) Oral Care Recommendations: Oral care BID;Oral care before and after PO;Patient independent with oral care Other Recommendations:  (n/a)    Recommendations for follow up therapy are one component of a multi-disciplinary discharge planning process, led by the attending physician.  Recommendations may be updated based on patient status, additional functional criteria and insurance authorization.  Follow up Recommendations None      Frequency  and Duration  (n/a)   (n/a)       Prognosis Prognosis for Safe Diet Advancement: Good Barriers to Reach Goals:  (baseline pulmonary status decline)      Swallow Study   General Date of Onset: 10/15/20 HPI: 75 y.o with significant PMH H/o Severe PAD and PVD, CAD, emphysema, Bilateral lower extremity neuropathy, and DM who presented to the ED with chief complaints of dizziness, generalized weakness and near syncopal episode. Pt being tx'ed for acute respiratory failure with COPD and CHF exacerbation.  Chest CT: Large right, moderate left pleural effusions and associated  atelectasis or consolidation.  2. Moderate centrilobular emphysema.  3. Cardiomegaly and coronary artery disease. Small  pericardial  effusion. Type of Study: Bedside Swallow Evaluation Previous Swallow Assessment: none Diet Prior to this Study: Regular;Thin liquids Temperature Spikes Noted: No (wbc 8.6) Respiratory Status: Nasal cannula (3L) History of Recent Intubation: No Behavior/Cognition: Alert;Cooperative;Pleasant mood;Distractible;Requires cueing (min) Oral Cavity Assessment: Within Functional Limits Oral Care Completed by SLP: Recent completion by staff Oral Cavity - Dentition: Missing dentition Vision: Functional for self-feeding Self-Feeding Abilities: Able to feed self;Needs set up Patient Positioning: Upright in bed (needed min positoining support for more upright sitting) Baseline Vocal Quality:  (Dysphonia; poor breath support) Volitional Cough:  (Fair) Volitional Swallow: Able to elicit    Oral/Motor/Sensory Function Overall Oral Motor/Sensory Function: Within functional limits   Ice Chips Ice chips: Not tested   Thin Liquid Thin Liquid: Within functional limits Presentation: Cup;Self Fed;Straw (10 trials)    Nectar Thick Nectar Thick Liquid: Not tested   Honey Thick Honey Thick Liquid: Not tested   Puree Puree: Within functional limits Presentation: Self Fed (3 trials)   Solid     Solid: Within functional limits (grossly) Presentation: Self Fed (3 trials) Other Comments: moistened       Orinda Kenner, [place], CCC-SLP Speech Language Pathologist Rehab Services [phone] Alazar Cherian 10/21/2020,4:37 PM

## 2020-10-22 ENCOUNTER — Telehealth: Payer: Self-pay

## 2020-10-22 DIAGNOSIS — R040 Epistaxis: Secondary | ICD-10-CM

## 2020-10-22 DIAGNOSIS — J9601 Acute respiratory failure with hypoxia: Secondary | ICD-10-CM | POA: Diagnosis not present

## 2020-10-22 LAB — CBC
HCT: 34 % — ABNORMAL LOW (ref 36.0–46.0)
Hemoglobin: 9.3 g/dL — ABNORMAL LOW (ref 12.0–15.0)
MCH: 22.3 pg — ABNORMAL LOW (ref 26.0–34.0)
MCHC: 27.4 g/dL — ABNORMAL LOW (ref 30.0–36.0)
MCV: 81.5 fL (ref 80.0–100.0)
Platelets: 172 10*3/uL (ref 150–400)
RBC: 4.17 MIL/uL (ref 3.87–5.11)
RDW: 19.7 % — ABNORMAL HIGH (ref 11.5–15.5)
WBC: 9 10*3/uL (ref 4.0–10.5)
nRBC: 0.4 % — ABNORMAL HIGH (ref 0.0–0.2)

## 2020-10-22 LAB — BASIC METABOLIC PANEL
Anion gap: 4 — ABNORMAL LOW (ref 5–15)
BUN: 31 mg/dL — ABNORMAL HIGH (ref 8–23)
CO2: 41 mmol/L — ABNORMAL HIGH (ref 22–32)
Calcium: 8.5 mg/dL — ABNORMAL LOW (ref 8.9–10.3)
Chloride: 93 mmol/L — ABNORMAL LOW (ref 98–111)
Creatinine, Ser: 1.03 mg/dL — ABNORMAL HIGH (ref 0.44–1.00)
GFR, Estimated: 57 mL/min — ABNORMAL LOW (ref >60)
Glucose, Bld: 99 mg/dL (ref 70–99)
Potassium: 3.8 mmol/L (ref 3.5–5.1)
Sodium: 138 mmol/L (ref 135–145)

## 2020-10-22 LAB — MAGNESIUM: Magnesium: 2.1 mg/dL (ref 1.7–2.4)

## 2020-10-22 MED ORDER — OXYMETAZOLINE HCL 0.05 % NA SOLN
1.0000 | Freq: Two times a day (BID) | NASAL | Status: DC
  Administered 2020-10-22 – 2020-10-23 (×4): 1 via NASAL
  Filled 2020-10-22: qty 15

## 2020-10-22 NOTE — Progress Notes (Addendum)
Cross coverage progress note    Event: Bilateral nosebleed.  Currently on nonrebreather instead of nasal cannula due to nosebleed.  Nosebleed worse with coughing.  Not responding to initial packing.  Rapid Rhino placed in right nostril just prior to my arrival. Patient has soaked  Brief Narrative: 75 year old female with history of atrial fibrillation on Xarelto, and multiple medical comorbidities, currently hospitalized for acute hypoxic and hypercapnic respiratory failure secondary to COPD and CHF exacerbation who presents with bilateral nosebleed not improving with packing,  Rapid assessment: Upon my arrival patient alert and oriented, on NRB.  Multiple soaked gauze seen on tray table and trash can.  Inflated rapid Rhino in right nostril, active trickling from left nostril  Assessment & Plan:   Epistaxis bilateral nares  Chronic anticoagulation with Xarelto Acute respiratory failure with hypoxia  - Continue rapid Rhino to right nostril - Afrin soaked gauze pledget to apply to right nostril - Eliquis discontinued, SCD for DVT prophylaxis - Monitor H&H - ENT consult placed as patient rapid Rhino dislodged within 30 minutes of placement -- Dr Tami Ribas responded on secure chat "if no further bleeding not much else to do. Would hold xarelta and avoid nasal oxygen. Afrin prn". -- Addendum: Patient follow-up later in the shift as of 6 AM, no further bleeding following Afrin soaked gauze packing - Continue oxygen via mask - Continue Afrin as needed per Dr. Tami Ribas  Objective: Vitals:   10/21/20 1847 10/21/20 1947 10/21/20 1951 10/21/20 2349  BP: 104/61 108/71  117/73  Pulse: (!) 102 (!) 109  77  Resp: 20 20  (!) 21  Temp: 98.3 F (36.8 C) 98.2 F (36.8 C)  98 F (36.7 C)  TempSrc: Oral Oral  Oral  SpO2: 96% 97% 98% 97%  Weight:      Height:        Intake/Output Summary (Last 24 hours) at 10/22/2020 0129 Last data filed at 10/21/2020 1830 Gross per 24 hour  Intake 1090 ml   Output 800 ml  Net 290 ml   Filed Weights   10/19/20 0636 10/20/20 0349 10/21/20 0240  Weight: 75.2 kg 74.8 kg 77.1 kg    Examination:  General exam: Appropriate to situation ENT:  INflated rapid Rhino in right nostril, active trickling of blood from left nostril Respiratory system: Clear to auscultation. Respiratory effort normal. Cardiovascular system: S1 & S2 heard, RRR. No JVD, murmurs, rubs, gallops or clicks. No pedal edema. Gastrointestinal system: Abdomen is nondistended, soft and nontender. No organomegaly or masses felt. Normal bowel sounds heard. Central nervous system: Alert and oriented. No focal neurological deficits.    Data Reviewed: I have personally reviewed following labs and imaging studies  CBC: Recent Labs  Lab 10/15/20 2026 10/16/20 0646 10/17/20 0622 10/18/20 0450 10/19/20 0522 10/20/20 0502 10/21/20 0452  WBC 9.7   < > 7.1 7.2 11.1* 9.0 8.6  NEUTROABS 7.8*  --   --   --   --   --   --   HGB 10.1*   < > 9.3* 9.0* 9.9* 8.8* 8.7*  HCT 37.4   < > 35.3* 32.5* 37.1 31.7* 32.7*  MCV 80.6   < > 79.9* 78.9* 81.0 80.5 81.1  PLT 270   < > 212 181 228 184 168   < > = values in this interval not displayed.   Basic Metabolic Panel: Recent Labs  Lab 10/16/20 0646 10/17/20 0622 10/18/20 0450 10/19/20 0522 10/20/20 0502 10/21/20 0452  NA 141 140 139 137  138 138  K 3.9 3.5 4.3 4.6 4.1 3.9  CL 101 96* 98 95* 93* 92*  CO2 33* 36* 36* 38* 40* 38*  GLUCOSE 125* 100* 122* 124* 104* 81  BUN 34* 25* 22 26* 24* 29*  CREATININE 1.07* 1.11* 0.97 0.92 1.01* 1.08*  CALCIUM 8.5* 8.3* 8.6* 9.1 8.7* 8.4*  MG 2.0 1.8 1.8 2.2  --   --   PHOS 3.2 3.9 2.4* 3.5  --   --    GFR: Estimated Creatinine Clearance: 46.2 mL/min (A) (by C-G formula based on SCr of 1.08 mg/dL (H)). Liver Function Tests: Recent Labs  Lab 10/15/20 2026  AST 20  ALT 17  ALKPHOS 66  BILITOT 0.8  PROT 5.9*  ALBUMIN 3.5   No results for input(s): LIPASE, AMYLASE in the last 168  hours. No results for input(s): AMMONIA in the last 168 hours. Coagulation Profile: Recent Labs  Lab 10/15/20 2026  INR 1.9*   Cardiac Enzymes: No results for input(s): CKTOTAL, CKMB, CKMBINDEX, TROPONINI in the last 168 hours. BNP (last 3 results) No results for input(s): PROBNP in the last 8760 hours. HbA1C: No results for input(s): HGBA1C in the last 72 hours. CBG: Recent Labs  Lab 10/16/20 1635 10/16/20 2016  GLUCAP 176* 108*   Lipid Profile: No results for input(s): CHOL, HDL, LDLCALC, TRIG, CHOLHDL, LDLDIRECT in the last 72 hours. Thyroid Function Tests: No results for input(s): TSH, T4TOTAL, FREET4, T3FREE, THYROIDAB in the last 72 hours. Anemia Panel: No results for input(s): VITAMINB12, FOLATE, FERRITIN, TIBC, IRON, RETICCTPCT in the last 72 hours. Sepsis Labs: Recent Labs  Lab 10/15/20 2026 10/15/20 2049 10/15/20 2216 10/16/20 0646 10/17/20 0622  PROCALCITON <0.10  --   --  <0.10 <0.10  LATICACIDVEN  --  1.0 1.0 1.3  --     Recent Results (from the past 240 hour(s))  Blood culture (routine single)     Status: None   Collection Time: 10/15/20  8:49 PM   Specimen: BLOOD  Result Value Ref Range Status   Specimen Description BLOOD RIGHT ANTECUBITAL  Final   Special Requests   Final    BOTTLES DRAWN AEROBIC AND ANAEROBIC Blood Culture adequate volume   Culture   Final    NO GROWTH 5 DAYS Performed at [facility], [address], [place], [place] [address]    Report Status 10/20/2020 FINAL  Final  Urine Culture     Status: None   Collection Time: 10/15/20  8:50 PM   Specimen: Urine, Random  Result Value Ref Range Status   Specimen Description   Final    URINE, RANDOM Performed at [facility], [address] [place], [place] [address]    Special Requests   Final    NONE Performed at [facility], [address], [place], [place] [address]    Culture   Final    NO GROWTH Performed at [facility], [place]  [address] [place], [place] [address]    Report Status 10/17/2020 FINAL  Final  Resp Panel by RT-PCR (Flu A&B, Covid) Nasopharyngeal Swab     Status: None   Collection Time: 10/15/20  8:50 PM   Specimen: Nasopharyngeal Swab; Nasopharyngeal(NP) swabs in vial transport medium  Result Value Ref Range Status   SARS Coronavirus 2 by RT PCR NEGATIVE NEGATIVE Final    Comment: (NOTE) SARS-CoV-2 target nucleic acids are NOT DETECTED.  The SARS-CoV-2 RNA is generally detectable in upper respiratory specimens during the acute phase  of infection. The lowest concentration of SARS-CoV-2 viral copies this assay can detect is 138 copies/mL. A negative result does not preclude SARS-Cov-2 infection and should not be used as the sole basis for treatment or other patient management decisions. A negative result may occur with  improper specimen collection/handling, submission of specimen other than nasopharyngeal swab, presence of viral mutation(s) within the areas targeted by this assay, and inadequate number of viral copies(<138 copies/mL). A negative result must be combined with clinical observations, patient history, and epidemiological information. The expected result is Negative.  Fact Sheet for Patients:  EntrepreneurPulse.com.au  Fact Sheet for Healthcare Providers:  IncredibleEmployment.be  This test is no t yet approved or cleared by the [place] FDA and  has been authorized for detection and/or diagnosis of SARS-CoV-2 by FDA under an Emergency Use Authorization (EUA). This EUA will remain  in effect (meaning this test can be used) for the duration of the COVID-19 declaration under Section 564(b)(1) of the Act, 21 U.S.C.section 360bbb-3(b)(1), unless the authorization is terminated  or revoked sooner.       Influenza A by PCR NEGATIVE NEGATIVE Final   Influenza B by PCR NEGATIVE NEGATIVE Final    Comment: (NOTE) The Xpert Xpress SARS-CoV-2/FLU/RSV plus  assay is intended as an aid in the diagnosis of influenza from Nasopharyngeal swab specimens and should not be used as a sole basis for treatment. Nasal washings and aspirates are unacceptable for Xpert Xpress SARS-CoV-2/FLU/RSV testing.  Fact Sheet for Patients: EntrepreneurPulse.com.au  Fact Sheet for Healthcare Providers: IncredibleEmployment.be  This test is not yet approved or cleared by the [place] FDA and has been authorized for detection and/or diagnosis of SARS-CoV-2 by FDA under an Emergency Use Authorization (EUA). This EUA will remain in effect (meaning this test can be used) for the duration of the COVID-19 declaration under Section 564(b)(1) of the Act, 21 U.S.C. section 360bbb-3(b)(1), unless the authorization is terminated or revoked.  Performed at [facility], [address], [place], [place] [address]          Radiology Studies: [facility] Chest [place] 1 View  Result Date: 10/21/2020 CLINICAL DATA:  Pleural effusion. EXAM: PORTABLE CHEST 1 VIEW COMPARISON:  One-view chest x-ray 10/19/2020 FINDINGS: Heart is enlarged. Atherosclerotic calcifications are present. Interstitial edema is slightly improved. Bilateral effusions and basilar airspace disease remain, right greater than left. Effusions are slightly decreased as well. IMPRESSION: 1. Slight decrease in interstitial edema and bilateral effusions. 2. Persistent bibasilar airspace disease, right greater than left. Electronically Signed   By: San Morelle M.D.   On: 10/21/2020 12:17        Scheduled Meds:  clopidogrel  75 mg Oral Daily   cyanocobalamin  1,000 mcg Intramuscular Daily   Followed by   Derrill Memo ON 10/24/2020] vitamin B-12  500 mcg Oral Daily   famotidine  20 mg Oral BID   feeding supplement  1 Container Oral TID BM   FLUoxetine  20 mg Oral Daily   fluticasone furoate-vilanterol  1 puff Inhalation Daily   folic acid  1 mg Oral Daily    furosemide  20 mg Intravenous BID   gabapentin  300 mg Oral TID   ipratropium-albuterol  3 mL Nebulization Q6H   iron polysaccharides  150 mg Oral Daily   metoprolol tartrate  12.5 mg Oral BID   multivitamin with minerals  1 tablet Oral Daily   oxymetazoline  1 spray Each Nare BID   pantoprazole  40 mg Oral Daily  rosuvastatin  20 mg Oral Daily   Vitamin D (Ergocalciferol)  50,000 Units Oral Q7 days   Continuous Infusions:   LOS: 7 days    Time spent: 30 min    Athena Masse, MD [facility]

## 2020-10-22 NOTE — Progress Notes (Signed)
OT Cancellation Note  Patient Details Name: Kayla Maxwell MRN: [id number] DOB: 06/07/1945   Cancelled Treatment:    Reason Eval/Treat Not Completed: Medical issues which prohibited therapy. Upon arrival to room, pt awake and supine in bed. Pt agreeable to OT tx, however SpO2 70s while on RA and resting HR 110s-130s. RN informed and pt left in care of RN. OT to re-attempt at later time/date as pt is medically appropriate.   Fredirick Maudlin, OTR/L [place]

## 2020-10-22 NOTE — Telephone Encounter (Signed)
Copied from [place] [id number]. Topic: General - Other >> Oct 22, 2020 11:23 AM Pawlus, Brayton Layman A wrote: Reason for CRM:  [facility] faxed over an order addressed to Dr Rosanna Randy, caller requested if Dr Rosanna Randy would be able to sign the order as well (caller stated it was signed by Tally Joe but also needs Dr Marlan Palau signature)

## 2020-10-22 NOTE — Progress Notes (Signed)
Patient's nose was heavily bleeding. She was coughing up clots and bloody thick phlegm. Notified Dr. Damita Dunnings of current condition, she evaluated patient and ordered nasal packing with afrin. Patient has been placed on a non rebreather since the nasal cannula was not sufficient. Patient has had both nares packed with gauze. Patient accidentally removed the rapid rhino. After 30 minutes of applying packaging with afrin the bleeding has ceased. Patient was given pain medicine per her request. Also informed Dr.Duncan of mottled legs and purple hue right foot. No new orders at this time.

## 2020-10-22 NOTE — Progress Notes (Signed)
PT Cancellation Note  Patient Details Name: Kayla Maxwell MRN: [id number] DOB: 1945/08/20   Cancelled Treatment:     Checked on pt after OT attempted. Pt's SpO2 in 70s while on RA and resting HR 110s-130s. PT session held this date. Will reassess at another date.    Josie Dixon 10/22/2020, 12:15 PM

## 2020-10-22 NOTE — Progress Notes (Signed)
   10/22/20 1729  Assess: MEWS Score  Temp 98 F (36.7 C)  BP 138/87  Pulse Rate (!) 132  ECG Heart Rate (!) 132  Resp 20  Level of Consciousness Alert  SpO2 96 %  O2 Device Nasal Cannula  O2 Flow Rate (L/min) 3 L/min  Assess: if the MEWS score is Yellow or Red  Were vital signs taken at a resting state? Yes  Focused Assessment No change from prior assessment  Does the patient meet 2 or more of the SIRS criteria? No  MEWS guidelines implemented *See Row Information* Yes  Treat  MEWS Interventions Administered scheduled meds/treatments  Pain Scale 0-10  Pain Score 0  Take Vital Signs  Increase Vital Sign Frequency  Yellow: Q 2hr X 2 then Q 4hr X 2, if remains yellow, continue Q 4hrs  Escalate  MEWS: Escalate Yellow: discuss with charge nurse/RN and consider discussing with provider and RRT  Notify: Charge Nurse/RN  Name of Charge Nurse/RN Notified Roanna Epley  Date Charge Nurse/RN Notified 10/22/20  Time Charge Nurse/RN Notified 1730  Document  Patient Outcome Other (Comment) (HR remains the same)  Progress note created (see row info) Yes

## 2020-10-22 NOTE — Progress Notes (Signed)
PROGRESS NOTE    Chelcy Bolda Curd  LZJ:[id number] DOB: 05/01/1945 DOA: 10/15/2020 PCP: Gwyneth Sprout, FNP    Brief Narrative: This 75 years old female with past medical history significant of atrial fibrillation, congestive heart failure, COPD, hyperlipidemia, hypertension and sleep apnea with history of a stroke and TIA presented to the hospital with complaints of dizziness, generalized weakness and near syncopal episodes for 1 week.  She was also short of breath and required supplemental oxygen enroute to the hospital.  She was initially noted to be hypoxic with pulse ox of 77% on 4 L of oxygen.  She did have her right foot which was erythematous and warm to touch concerning for possible cellulitis.  BNP was elevated at 967.  EKG showed atrial fibrillation.  Chest x-ray showed mild to moderate CHF.  Patient was then admitted hospital for possible pneumonia, cellulitis and CHF/COPD exacerbation.   During hospitalization, patient has received IV Lasix and BiPAP at nighttime and has been continued on Lasix.  She is [place] on supplemental oxygen but has continued to feel better.  She is currently awaiting for skilled nursing facility placement.   Assessment & Plan:   Active Problems:   Acute respiratory failure with hypoxia (HCC)   Pressure injury of skin   Epistaxis  Acute hypoxic and hypercapnic respiratory failure secondary to COPD and CHF exacerbation: Patient has been noncompliant with CPAP. Presented with high PCO2, low PO2. Patient has received IV Lasix and BiPAP during the course.  Continue supplemental oxygen. Continue nebulizers, diuretics.  Advised outpatient sleep studies.  Wean oxygen as tolerated..   Repeat X-ray showed decrease effusion and improvement in chest x-ray.    Bilateral leg erythema: Cellulitis is ruled out.  Blood cultures negative so far. Patient was initially started on Azactam due to penicillin allergy which was discontinued.  Acute on chronic systolic  CHF: Last LVEF 02%  in 05/2020 Continue Lasix 20 mg IV twice daily, tolerating well. Continue metoprolol as tolerated.  Lisinopril on hold due to AKI but will initiate tomorrow due to stable creatinine levels.    Recent echocardiogram from 10/16/2020 showing LV ejection fraction of 45 to 50%.   Cardiology was consulted.  Continue diuresis.  Epistaxis: Patient had overnight nosebleed which stopped with Afrin) nasal packing ENT consulted.  Acute metabolic encephalopathy due to hypercapnia: Improved with BiPAP and encouraged to use especially during night and as needed. Patient is noncompliant with CPAP at home,  advised outpatient sleep studies    Bilateral lower extremity neuropathy: ABI within normal limits.  Ultrasound negative for DVT Neuropathy could be due to vitamin B12 deficiency. Follow-up Dr. Delana Meyer as outpatient  B12 deficiency: Continue vitamin B12 1000 mcg IM injection daily for 7 days followed by oral supplement.  Iron deficiency Continue ferrous sulfate.  Vitamin D deficiency: Continue vitamin D once a week   Stage I sacral pressure ulceration.  Present on admission.  Continue preventive protocol. Pressure Injury 10/17/20 Sacrum Mid Stage 1 -  Intact skin with non-blanchable redness of a localized area usually over a bony prominence. red (Active)  10/17/20 1459  Location: Sacrum  Location Orientation: Mid  Staging: Stage 1 -  Intact skin with non-blanchable redness of a localized area usually over a bony prominence.  Wound Description (Comments): red  Present on Admission: Yes      Type 2 diabetes mellitus: Diet controlled, continue sliding scale..   Deconditioning, debility.   Patient has been seen by physical therapy who recommended skilled nursing facility  placement on discharge.     DVT prophylaxis: Xarelto Code Status: DNR Family Communication: No family at bed side. Disposition Plan:  Awaiting for skilled nursing facility placement.  Likely  discharge in 1 to 2 days.  Patient is not medically stable so far due to need for IV diuretics, hypoxia on minimal exertion and room air.Could consider supplemental oxygen on discharge.  Consultants:  Cardiology  Procedures:  None Antimicrobials:  None  Subjective: Patient was seen and examined at bedside.  Overnight events noted.   Patient had nosebleeds which stopped with Afrin and nasal packing. Patient reports feeling better , appears chronically ill looking.  Objective: Vitals:   10/22/20 0800 10/22/20 0900 10/22/20 1200 10/22/20 1416  BP:  104/63 126/74   Pulse:  83    Resp:   19   Temp:  97.8 F (36.6 C) 98.1 F (36.7 C)   TempSrc:  Oral Oral   SpO2: 97% 90% 100% 95%  Weight:      Height:        Intake/Output Summary (Last 24 hours) at 10/22/2020 1432 Last data filed at 10/22/2020 1424 Gross per 24 hour  Intake 610 ml  Output --  Net 610 ml   Filed Weights   10/20/20 0349 10/21/20 0240 10/22/20 0339  Weight: 74.8 kg 77.1 kg 78.2 kg    Examination:  General exam: Appears comfortable, not in any acute distress, appears chronically ill looking.   Dried blood noted on the nostrils. Respiratory system: Clear to auscultation. Respiratory effort normal. Cardiovascular system: S1-S2 heard, regular rate and rhythm, no murmur. Gastrointestinal system: Abdominal soft, nontender, nondistended, BS + Central nervous system: Alert and oriented. No focal neurological deficits. Extremities: No edema, no cyanosis, no clubbing. Skin: No rashes, lesions or ulcers Psychiatry: Judgement and insight appear normal. Mood & affect appropriate.     Data Reviewed: I have personally reviewed following labs and imaging studies  CBC: Recent Labs  Lab 10/15/20 2026 10/16/20 0646 10/18/20 0450 10/19/20 0522 10/20/20 0502 10/21/20 0452 10/22/20 0609  WBC 9.7   < > 7.2 11.1* 9.0 8.6 9.0  NEUTROABS 7.8*  --   --   --   --   --   --   HGB 10.1*   < > 9.0* 9.9* 8.8* 8.7* 9.3*   HCT 37.4   < > 32.5* 37.1 31.7* 32.7* 34.0*  MCV 80.6   < > 78.9* 81.0 80.5 81.1 81.5  PLT 270   < > 181 228 184 168 172   < > = values in this interval not displayed.   Basic Metabolic Panel: Recent Labs  Lab 10/16/20 0646 10/17/20 0622 10/18/20 0450 10/19/20 0522 10/20/20 0502 10/21/20 0452 10/22/20 0609  NA 141 140 139 137 138 138 138  K 3.9 3.5 4.3 4.6 4.1 3.9 3.8  CL 101 96* 98 95* 93* 92* 93*  CO2 33* 36* 36* 38* 40* 38* 41*  GLUCOSE 125* 100* 122* 124* 104* 81 99  BUN 34* 25* 22 26* 24* 29* 31*  CREATININE 1.07* 1.11* 0.97 0.92 1.01* 1.08* 1.03*  CALCIUM 8.5* 8.3* 8.6* 9.1 8.7* 8.4* 8.5*  MG 2.0 1.8 1.8 2.2  --   --  2.1  PHOS 3.2 3.9 2.4* 3.5  --   --   --    GFR: Estimated Creatinine Clearance: 48.8 mL/min (A) (by C-G formula based on SCr of 1.03 mg/dL (H)). Liver Function Tests: Recent Labs  Lab 10/15/20 2026  AST 20  ALT 17  ALKPHOS  66  BILITOT 0.8  PROT 5.9*  ALBUMIN 3.5   No results for input(s): LIPASE, AMYLASE in the last 168 hours. No results for input(s): AMMONIA in the last 168 hours. Coagulation Profile: Recent Labs  Lab 10/15/20 2026  INR 1.9*   Cardiac Enzymes: No results for input(s): CKTOTAL, CKMB, CKMBINDEX, TROPONINI in the last 168 hours. BNP (last 3 results) No results for input(s): PROBNP in the last 8760 hours. HbA1C: No results for input(s): HGBA1C in the last 72 hours. CBG: Recent Labs  Lab 10/16/20 1635 10/16/20 2016  GLUCAP 176* 108*   Lipid Profile: No results for input(s): CHOL, HDL, LDLCALC, TRIG, CHOLHDL, LDLDIRECT in the last 72 hours. Thyroid Function Tests: No results for input(s): TSH, T4TOTAL, FREET4, T3FREE, THYROIDAB in the last 72 hours. Anemia Panel: No results for input(s): VITAMINB12, FOLATE, FERRITIN, TIBC, IRON, RETICCTPCT in the last 72 hours. Sepsis Labs: Recent Labs  Lab 10/15/20 2026 10/15/20 2049 10/15/20 2216 10/16/20 0646 10/17/20 0622  PROCALCITON <0.10  --   --  <0.10 <0.10   LATICACIDVEN  --  1.0 1.0 1.3  --     Recent Results (from the past 240 hour(s))  Blood culture (routine single)     Status: None   Collection Time: 10/15/20  8:49 PM   Specimen: BLOOD  Result Value Ref Range Status   Specimen Description BLOOD RIGHT ANTECUBITAL  Final   Special Requests   Final    BOTTLES DRAWN AEROBIC AND ANAEROBIC Blood Culture adequate volume   Culture   Final    NO GROWTH 5 DAYS Performed at [facility], [address], [place], [place] [address]    Report Status 10/20/2020 FINAL  Final  Urine Culture     Status: None   Collection Time: 10/15/20  8:50 PM   Specimen: Urine, Random  Result Value Ref Range Status   Specimen Description   Final    URINE, RANDOM Performed at [facility], [address], [place], [place] [address]    Special Requests   Final    NONE Performed at [facility], [address] [place], [place] [address]    Culture   Final    NO GROWTH Performed at [facility], [place] [address], [place], [place] [address]    Report Status 10/17/2020 FINAL  Final  Resp Panel by RT-PCR (Flu A&B, Covid) Nasopharyngeal Swab     Status: None   Collection Time: 10/15/20  8:50 PM   Specimen: Nasopharyngeal Swab; Nasopharyngeal(NP) swabs in vial transport medium  Result Value Ref Range Status   SARS Coronavirus 2 by RT PCR NEGATIVE NEGATIVE Final    Comment: (NOTE) SARS-CoV-2 target nucleic acids are NOT DETECTED.  The SARS-CoV-2 RNA is generally detectable in upper respiratory specimens during the acute phase of infection. The lowest concentration of SARS-CoV-2 viral copies this assay can detect is 138 copies/mL. A negative result does not preclude SARS-Cov-2 infection and should not be used as the sole basis for treatment or other patient management decisions. A negative result may occur with  improper specimen collection/handling, submission of specimen other than nasopharyngeal swab, presence of viral  mutation(s) within the areas targeted by this assay, and inadequate number of viral copies(<138 copies/mL). A negative result must be combined with clinical observations, patient history, and epidemiological information. The expected result is Negative.  Fact Sheet for Patients:  EntrepreneurPulse.com.au  Fact Sheet for Healthcare Providers:  IncredibleEmployment.be  This test is no t yet approved or  cleared by the [place] and  has been authorized for detection and/or diagnosis of SARS-CoV-2 by FDA under an Emergency Use Authorization (EUA). This EUA will remain  in effect (meaning this test can be used) for the duration of the COVID-19 declaration under Section 564(b)(1) of the Act, 21 U.S.C.section 360bbb-3(b)(1), unless the authorization is terminated  or revoked sooner.       Influenza A by PCR NEGATIVE NEGATIVE Final   Influenza B by PCR NEGATIVE NEGATIVE Final    Comment: (NOTE) The Xpert Xpress SARS-CoV-2/FLU/RSV plus assay is intended as an aid in the diagnosis of influenza from Nasopharyngeal swab specimens and should not be used as a sole basis for treatment. Nasal washings and aspirates are unacceptable for Xpert Xpress SARS-CoV-2/FLU/RSV testing.  Fact Sheet for Patients: EntrepreneurPulse.com.au  Fact Sheet for Healthcare Providers: IncredibleEmployment.be  This test is not yet approved or cleared by the [place] FDA and has been authorized for detection and/or diagnosis of SARS-CoV-2 by FDA under an Emergency Use Authorization (EUA). This EUA will remain in effect (meaning this test can be used) for the duration of the COVID-19 declaration under Section 564(b)(1) of the Act, 21 U.S.C. section 360bbb-3(b)(1), unless the authorization is terminated or revoked.  Performed at [facility], [address] [place], [place] [address]     Radiology Studies: [facility] Chest  [place] 1 View  Result Date: 10/21/2020 CLINICAL DATA:  Pleural effusion. EXAM: PORTABLE CHEST 1 VIEW COMPARISON:  One-view chest x-ray 10/19/2020 FINDINGS: Heart is enlarged. Atherosclerotic calcifications are present. Interstitial edema is slightly improved. Bilateral effusions and basilar airspace disease remain, right greater than left. Effusions are slightly decreased as well. IMPRESSION: 1. Slight decrease in interstitial edema and bilateral effusions. 2. Persistent bibasilar airspace disease, right greater than left. Electronically Signed   By: San Morelle M.D.   On: 10/21/2020 12:17    Scheduled Meds:  clopidogrel  75 mg Oral Daily   cyanocobalamin  1,000 mcg Intramuscular Daily   Followed by   Derrill Memo ON 10/24/2020] vitamin B-12  500 mcg Oral Daily   famotidine  20 mg Oral BID   feeding supplement  1 Container Oral TID BM   FLUoxetine  20 mg Oral Daily   fluticasone furoate-vilanterol  1 puff Inhalation Daily   folic acid  1 mg Oral Daily   furosemide  20 mg Intravenous BID   gabapentin  300 mg Oral TID   ipratropium-albuterol  3 mL Nebulization Q6H   iron polysaccharides  150 mg Oral Daily   metoprolol tartrate  12.5 mg Oral BID   multivitamin with minerals  1 tablet Oral Daily   oxymetazoline  1 spray Each Nare BID   pantoprazole  40 mg Oral Daily   rosuvastatin  20 mg Oral Daily   Vitamin D (Ergocalciferol)  50,000 Units Oral Q7 days   Continuous Infusions:   LOS: 7 days    Time spent: 35 mins    Cheryln Balcom, MD [facility]   If 7PM-7AM, please contact night-coverage

## 2020-10-22 NOTE — Plan of Care (Signed)
  Problem: Education: Goal: Knowledge of General Education information will improve Description: Including pain rating scale, medication(s)/side effects and non-pharmacologic comfort measures 10/22/2020 1253 by Cristela Blue, RN Outcome: Progressing 10/22/2020 1253 by Cristela Blue, RN Outcome: Progressing   Problem: Health Behavior/Discharge Planning: Goal: Ability to manage health-related needs will improve 10/22/2020 1253 by Cristela Blue, RN Outcome: Progressing 10/22/2020 1253 by Cristela Blue, RN Outcome: Progressing   Problem: Clinical Measurements: Goal: Ability to maintain clinical measurements within normal limits will improve 10/22/2020 1253 by Cristela Blue, RN Outcome: Progressing 10/22/2020 1253 by Cristela Blue, RN Outcome: Progressing Goal: Will remain free from infection 10/22/2020 1253 by Cristela Blue, RN Outcome: Progressing 10/22/2020 1253 by Cristela Blue, RN Outcome: Progressing Goal: Diagnostic test results will improve 10/22/2020 1253 by Cristela Blue, RN Outcome: Progressing 10/22/2020 1253 by Cristela Blue, RN Outcome: Progressing Goal: Respiratory complications will improve 10/22/2020 1253 by Cristela Blue, RN Outcome: Progressing 10/22/2020 1253 by Cristela Blue, RN Outcome: Progressing Goal: Cardiovascular complication will be avoided 10/22/2020 1253 by Cristela Blue, RN Outcome: Progressing 10/22/2020 1253 by Cristela Blue, RN Outcome: Progressing   Problem: Activity: Goal: Risk for activity intolerance will decrease 10/22/2020 1253 by Cristela Blue, RN Outcome: Progressing 10/22/2020 1253 by Cristela Blue, RN Outcome: Progressing   Problem: Nutrition: Goal: Adequate nutrition will be maintained 10/22/2020 1253 by Cristela Blue, RN Outcome: Progressing 10/22/2020 1253 by Cristela Blue, RN Outcome: Progressing   Problem: Coping: Goal: Level of anxiety will decrease 10/22/2020 1253 by Cristela Blue, RN Outcome:  Progressing 10/22/2020 1253 by Cristela Blue, RN Outcome: Progressing   Problem: Elimination: Goal: Will not experience complications related to bowel motility 10/22/2020 1253 by Cristela Blue, RN Outcome: Progressing 10/22/2020 1253 by Cristela Blue, RN Outcome: Progressing Goal: Will not experience complications related to urinary retention 10/22/2020 1253 by Cristela Blue, RN Outcome: Progressing 10/22/2020 1253 by Cristela Blue, RN Outcome: Progressing   Problem: Pain Managment: Goal: General experience of comfort will improve 10/22/2020 1253 by Cristela Blue, RN Outcome: Progressing 10/22/2020 1253 by Cristela Blue, RN Outcome: Progressing   Problem: Safety: Goal: Ability to remain free from injury will improve 10/22/2020 1253 by Cristela Blue, RN Outcome: Progressing 10/22/2020 1253 by Cristela Blue, RN Outcome: Progressing   Problem: Skin Integrity: Goal: Risk for impaired skin integrity will decrease 10/22/2020 1253 by Cristela Blue, RN Outcome: Progressing 10/22/2020 1253 by Cristela Blue, RN Outcome: Progressing   Problem: Education: Goal: Ability to demonstrate management of disease process will improve 10/22/2020 1253 by Cristela Blue, RN Outcome: Progressing 10/22/2020 1253 by Cristela Blue, RN Outcome: Progressing Goal: Ability to verbalize understanding of medication therapies will improve 10/22/2020 1253 by Cristela Blue, RN Outcome: Progressing 10/22/2020 1253 by Cristela Blue, RN Outcome: Progressing Goal: Individualized Educational Video(s) 10/22/2020 1253 by Cristela Blue, RN Outcome: Progressing 10/22/2020 1253 by Cristela Blue, RN Outcome: Progressing   Problem: Activity: Goal: Capacity to carry out activities will improve 10/22/2020 1253 by Cristela Blue, RN Outcome: Progressing 10/22/2020 1253 by Cristela Blue, RN Outcome: Progressing   Problem: Cardiac: Goal: Ability to achieve and maintain adequate  cardiopulmonary perfusion will improve 10/22/2020 1253 by Cristela Blue, RN Outcome: Progressing 10/22/2020 1253 by Cristela Blue, RN Outcome: Progressing

## 2020-10-23 DIAGNOSIS — L54 Erythema in diseases classified elsewhere: Secondary | ICD-10-CM | POA: Diagnosis not present

## 2020-10-23 DIAGNOSIS — K2100 Gastro-esophageal reflux disease with esophagitis, without bleeding: Secondary | ICD-10-CM | POA: Diagnosis not present

## 2020-10-23 DIAGNOSIS — R410 Disorientation, unspecified: Secondary | ICD-10-CM | POA: Diagnosis not present

## 2020-10-23 DIAGNOSIS — S52511A Displaced fracture of right radial styloid process, initial encounter for closed fracture: Secondary | ICD-10-CM | POA: Diagnosis not present

## 2020-10-23 DIAGNOSIS — R0603 Acute respiratory distress: Secondary | ICD-10-CM | POA: Diagnosis not present

## 2020-10-23 DIAGNOSIS — I110 Hypertensive heart disease with heart failure: Secondary | ICD-10-CM | POA: Diagnosis present

## 2020-10-23 DIAGNOSIS — I5020 Unspecified systolic (congestive) heart failure: Secondary | ICD-10-CM | POA: Diagnosis not present

## 2020-10-23 DIAGNOSIS — I4891 Unspecified atrial fibrillation: Secondary | ICD-10-CM | POA: Diagnosis present

## 2020-10-23 DIAGNOSIS — F339 Major depressive disorder, recurrent, unspecified: Secondary | ICD-10-CM | POA: Diagnosis not present

## 2020-10-23 DIAGNOSIS — R230 Cyanosis: Secondary | ICD-10-CM | POA: Diagnosis not present

## 2020-10-23 DIAGNOSIS — J050 Acute obstructive laryngitis [croup]: Secondary | ICD-10-CM | POA: Diagnosis not present

## 2020-10-23 DIAGNOSIS — K219 Gastro-esophageal reflux disease without esophagitis: Secondary | ICD-10-CM | POA: Diagnosis present

## 2020-10-23 DIAGNOSIS — Z741 Need for assistance with personal care: Secondary | ICD-10-CM | POA: Diagnosis not present

## 2020-10-23 DIAGNOSIS — Z743 Need for continuous supervision: Secondary | ICD-10-CM | POA: Diagnosis not present

## 2020-10-23 DIAGNOSIS — I739 Peripheral vascular disease, unspecified: Secondary | ICD-10-CM | POA: Diagnosis present

## 2020-10-23 DIAGNOSIS — Z20822 Contact with and (suspected) exposure to covid-19: Secondary | ICD-10-CM | POA: Diagnosis present

## 2020-10-23 DIAGNOSIS — J811 Chronic pulmonary edema: Secondary | ICD-10-CM | POA: Diagnosis not present

## 2020-10-23 DIAGNOSIS — I2510 Atherosclerotic heart disease of native coronary artery without angina pectoris: Secondary | ICD-10-CM | POA: Diagnosis not present

## 2020-10-23 DIAGNOSIS — R0902 Hypoxemia: Secondary | ICD-10-CM | POA: Diagnosis not present

## 2020-10-23 DIAGNOSIS — J90 Pleural effusion, not elsewhere classified: Secondary | ICD-10-CM | POA: Diagnosis not present

## 2020-10-23 DIAGNOSIS — F32A Depression, unspecified: Secondary | ICD-10-CM | POA: Diagnosis not present

## 2020-10-23 DIAGNOSIS — R627 Adult failure to thrive: Secondary | ICD-10-CM | POA: Diagnosis present

## 2020-10-23 DIAGNOSIS — E669 Obesity, unspecified: Secondary | ICD-10-CM | POA: Diagnosis present

## 2020-10-23 DIAGNOSIS — R2689 Other abnormalities of gait and mobility: Secondary | ICD-10-CM | POA: Diagnosis not present

## 2020-10-23 DIAGNOSIS — F419 Anxiety disorder, unspecified: Secondary | ICD-10-CM | POA: Diagnosis not present

## 2020-10-23 DIAGNOSIS — J99 Respiratory disorders in diseases classified elsewhere: Secondary | ICD-10-CM | POA: Diagnosis not present

## 2020-10-23 DIAGNOSIS — I10 Essential (primary) hypertension: Secondary | ICD-10-CM | POA: Diagnosis not present

## 2020-10-23 DIAGNOSIS — I5023 Acute on chronic systolic (congestive) heart failure: Secondary | ICD-10-CM | POA: Diagnosis present

## 2020-10-23 DIAGNOSIS — J449 Chronic obstructive pulmonary disease, unspecified: Secondary | ICD-10-CM | POA: Diagnosis not present

## 2020-10-23 DIAGNOSIS — I639 Cerebral infarction, unspecified: Secondary | ICD-10-CM | POA: Diagnosis not present

## 2020-10-23 DIAGNOSIS — G4730 Sleep apnea, unspecified: Secondary | ICD-10-CM | POA: Diagnosis present

## 2020-10-23 DIAGNOSIS — R4182 Altered mental status, unspecified: Secondary | ICD-10-CM | POA: Diagnosis not present

## 2020-10-23 DIAGNOSIS — J9601 Acute respiratory failure with hypoxia: Secondary | ICD-10-CM | POA: Diagnosis not present

## 2020-10-23 DIAGNOSIS — I70229 Atherosclerosis of native arteries of extremities with rest pain, unspecified extremity: Secondary | ICD-10-CM | POA: Diagnosis not present

## 2020-10-23 DIAGNOSIS — J9622 Acute and chronic respiratory failure with hypercapnia: Secondary | ICD-10-CM | POA: Diagnosis present

## 2020-10-23 DIAGNOSIS — G9009 Other idiopathic peripheral autonomic neuropathy: Secondary | ICD-10-CM | POA: Diagnosis not present

## 2020-10-23 DIAGNOSIS — I130 Hypertensive heart and chronic kidney disease with heart failure and stage 1 through stage 4 chronic kidney disease, or unspecified chronic kidney disease: Secondary | ICD-10-CM | POA: Diagnosis not present

## 2020-10-23 DIAGNOSIS — E539 Vitamin B deficiency, unspecified: Secondary | ICD-10-CM | POA: Diagnosis not present

## 2020-10-23 DIAGNOSIS — J111 Influenza due to unidentified influenza virus with other respiratory manifestations: Secondary | ICD-10-CM | POA: Diagnosis not present

## 2020-10-23 DIAGNOSIS — R069 Unspecified abnormalities of breathing: Secondary | ICD-10-CM | POA: Diagnosis not present

## 2020-10-23 DIAGNOSIS — E785 Hyperlipidemia, unspecified: Secondary | ICD-10-CM | POA: Diagnosis present

## 2020-10-23 DIAGNOSIS — J9621 Acute and chronic respiratory failure with hypoxia: Secondary | ICD-10-CM | POA: Diagnosis present

## 2020-10-23 DIAGNOSIS — J9610 Chronic respiratory failure, unspecified whether with hypoxia or hypercapnia: Secondary | ICD-10-CM | POA: Diagnosis not present

## 2020-10-23 DIAGNOSIS — J9811 Atelectasis: Secondary | ICD-10-CM | POA: Diagnosis not present

## 2020-10-23 DIAGNOSIS — Z9981 Dependence on supplemental oxygen: Secondary | ICD-10-CM | POA: Diagnosis not present

## 2020-10-23 DIAGNOSIS — J441 Chronic obstructive pulmonary disease with (acute) exacerbation: Secondary | ICD-10-CM | POA: Diagnosis present

## 2020-10-23 DIAGNOSIS — L89311 Pressure ulcer of right buttock, stage 1: Secondary | ICD-10-CM | POA: Diagnosis present

## 2020-10-23 DIAGNOSIS — E119 Type 2 diabetes mellitus without complications: Secondary | ICD-10-CM | POA: Diagnosis not present

## 2020-10-23 DIAGNOSIS — M6259 Muscle wasting and atrophy, not elsewhere classified, multiple sites: Secondary | ICD-10-CM | POA: Diagnosis not present

## 2020-10-23 DIAGNOSIS — R001 Bradycardia, unspecified: Secondary | ICD-10-CM | POA: Diagnosis not present

## 2020-10-23 DIAGNOSIS — I509 Heart failure, unspecified: Secondary | ICD-10-CM | POA: Diagnosis not present

## 2020-10-23 DIAGNOSIS — J9600 Acute respiratory failure, unspecified whether with hypoxia or hypercapnia: Secondary | ICD-10-CM | POA: Diagnosis not present

## 2020-10-23 DIAGNOSIS — I872 Venous insufficiency (chronic) (peripheral): Secondary | ICD-10-CM | POA: Diagnosis present

## 2020-10-23 DIAGNOSIS — W19XXXA Unspecified fall, initial encounter: Secondary | ICD-10-CM | POA: Diagnosis not present

## 2020-10-23 DIAGNOSIS — Z515 Encounter for palliative care: Secondary | ICD-10-CM | POA: Diagnosis not present

## 2020-10-23 DIAGNOSIS — F4312 Post-traumatic stress disorder, chronic: Secondary | ICD-10-CM | POA: Diagnosis not present

## 2020-10-23 DIAGNOSIS — G9389 Other specified disorders of brain: Secondary | ICD-10-CM | POA: Diagnosis not present

## 2020-10-23 DIAGNOSIS — G9341 Metabolic encephalopathy: Secondary | ICD-10-CM | POA: Diagnosis present

## 2020-10-23 DIAGNOSIS — R251 Tremor, unspecified: Secondary | ICD-10-CM | POA: Diagnosis not present

## 2020-10-23 DIAGNOSIS — D649 Anemia, unspecified: Secondary | ICD-10-CM | POA: Diagnosis present

## 2020-10-23 DIAGNOSIS — L89321 Pressure ulcer of left buttock, stage 1: Secondary | ICD-10-CM | POA: Diagnosis present

## 2020-10-23 DIAGNOSIS — Z66 Do not resuscitate: Secondary | ICD-10-CM | POA: Diagnosis present

## 2020-10-23 DIAGNOSIS — G629 Polyneuropathy, unspecified: Secondary | ICD-10-CM | POA: Diagnosis present

## 2020-10-23 DIAGNOSIS — R2681 Unsteadiness on feet: Secondary | ICD-10-CM | POA: Diagnosis not present

## 2020-10-23 DIAGNOSIS — L89151 Pressure ulcer of sacral region, stage 1: Secondary | ICD-10-CM | POA: Diagnosis present

## 2020-10-23 LAB — BASIC METABOLIC PANEL
Anion gap: 6 (ref 5–15)
BUN: 26 mg/dL — ABNORMAL HIGH (ref 8–23)
CO2: 40 mmol/L — ABNORMAL HIGH (ref 22–32)
Calcium: 8.7 mg/dL — ABNORMAL LOW (ref 8.9–10.3)
Chloride: 93 mmol/L — ABNORMAL LOW (ref 98–111)
Creatinine, Ser: 0.78 mg/dL (ref 0.44–1.00)
GFR, Estimated: >60 mL/min (ref >60)
Glucose, Bld: 98 mg/dL (ref 70–99)
Potassium: 3.8 mmol/L (ref 3.5–5.1)
Sodium: 139 mmol/L (ref 135–145)

## 2020-10-23 LAB — CBC
HCT: 33 % — ABNORMAL LOW (ref 36.0–46.0)
Hemoglobin: 9.3 g/dL — ABNORMAL LOW (ref 12.0–15.0)
MCH: 23.5 pg — ABNORMAL LOW (ref 26.0–34.0)
MCHC: 28.2 g/dL — ABNORMAL LOW (ref 30.0–36.0)
MCV: 83.3 fL (ref 80.0–100.0)
Platelets: 169 10*3/uL (ref 150–400)
RBC: 3.96 MIL/uL (ref 3.87–5.11)
RDW: 20.4 % — ABNORMAL HIGH (ref 11.5–15.5)
WBC: 8.9 10*3/uL (ref 4.0–10.5)
nRBC: 0 % (ref 0.0–0.2)

## 2020-10-23 LAB — RESP PANEL BY RT-PCR (FLU A&B, COVID) ARPGX2
Influenza A by PCR: NEGATIVE
Influenza B by PCR: NEGATIVE
SARS Coronavirus 2 by RT PCR: NEGATIVE

## 2020-10-23 LAB — PHOSPHORUS: Phosphorus: 3.7 mg/dL (ref 2.5–4.6)

## 2020-10-23 LAB — MAGNESIUM: Magnesium: 2.1 mg/dL (ref 1.7–2.4)

## 2020-10-23 MED ORDER — VITAMIN D (ERGOCALCIFEROL) 1.25 MG (50000 UNIT) PO CAPS: 50000.0000 [IU] | ORAL_CAPSULE | ORAL | 1 refills | Status: AC

## 2020-10-23 MED ORDER — OXYMETAZOLINE HCL 0.05 % NA SOLN: 1.0000 | Freq: Two times a day (BID) | NASAL | 0 refills | Status: AC

## 2020-10-23 MED ORDER — PANTOPRAZOLE SODIUM 40 MG PO TBEC: 40.0000 mg | DELAYED_RELEASE_TABLET | Freq: Every day | ORAL | 1 refills | Status: AC

## 2020-10-23 MED ORDER — METOPROLOL TARTRATE 25 MG PO TABS: 12.5000 mg | ORAL_TABLET | Freq: Two times a day (BID) | ORAL | 1 refills | Status: AC

## 2020-10-23 MED ORDER — FOLIC ACID 1 MG PO TABS: 1.0000 mg | ORAL_TABLET | Freq: Every day | ORAL | 1 refills | Status: AC

## 2020-10-23 MED ORDER — CYANOCOBALAMIN 500 MCG PO TABS: 500.0000 ug | ORAL_TABLET | Freq: Every day | ORAL | 2 refills | Status: AC

## 2020-10-23 NOTE — Plan of Care (Signed)
Problem: Education: Goal: Knowledge of General Education information will improve Description: Including pain rating scale, medication(s)/side effects and non-pharmacologic comfort measures 10/23/2020 1130 by Cristela Blue, RN Outcome: Adequate for Discharge 10/23/2020 1130 by Cristela Blue, RN Outcome: Adequate for Discharge   Problem: Health Behavior/Discharge Planning: Goal: Ability to manage health-related needs will improve 10/23/2020 1130 by Cristela Blue, RN Outcome: Adequate for Discharge 10/23/2020 1130 by Cristela Blue, RN Outcome: Adequate for Discharge   Problem: Clinical Measurements: Goal: Ability to maintain clinical measurements within normal limits will improve 10/23/2020 1130 by Cristela Blue, RN Outcome: Adequate for Discharge 10/23/2020 1130 by Cristela Blue, RN Outcome: Adequate for Discharge Goal: Will remain free from infection 10/23/2020 1130 by Cristela Blue, RN Outcome: Adequate for Discharge 10/23/2020 1130 by Cristela Blue, RN Outcome: Adequate for Discharge Goal: Diagnostic test results will improve 10/23/2020 1130 by Cristela Blue, RN Outcome: Adequate for Discharge 10/23/2020 1130 by Cristela Blue, RN Outcome: Adequate for Discharge Goal: Respiratory complications will improve 10/23/2020 1130 by Cristela Blue, RN Outcome: Adequate for Discharge 10/23/2020 1130 by Cristela Blue, RN Outcome: Adequate for Discharge Goal: Cardiovascular complication will be avoided 10/23/2020 1130 by Cristela Blue, RN Outcome: Adequate for Discharge 10/23/2020 1130 by Cristela Blue, RN Outcome: Adequate for Discharge   Problem: Activity: Goal: Risk for activity intolerance will decrease 10/23/2020 1130 by Cristela Blue, RN Outcome: Adequate for Discharge 10/23/2020 1130 by Cristela Blue, RN Outcome: Adequate for Discharge   Problem: Nutrition: Goal: Adequate nutrition will be maintained 10/23/2020 1130 by Cristela Blue, RN Outcome:  Adequate for Discharge 10/23/2020 1130 by Cristela Blue, RN Outcome: Adequate for Discharge   Problem: Coping: Goal: Level of anxiety will decrease 10/23/2020 1130 by Cristela Blue, RN Outcome: Adequate for Discharge 10/23/2020 1130 by Cristela Blue, RN Outcome: Adequate for Discharge   Problem: Elimination: Goal: Will not experience complications related to bowel motility 10/23/2020 1130 by Cristela Blue, RN Outcome: Adequate for Discharge 10/23/2020 1130 by Cristela Blue, RN Outcome: Adequate for Discharge Goal: Will not experience complications related to urinary retention 10/23/2020 1130 by Cristela Blue, RN Outcome: Adequate for Discharge 10/23/2020 1130 by Cristela Blue, RN Outcome: Adequate for Discharge   Problem: Pain Managment: Goal: General experience of comfort will improve 10/23/2020 1130 by Cristela Blue, RN Outcome: Adequate for Discharge 10/23/2020 1130 by Cristela Blue, RN Outcome: Adequate for Discharge   Problem: Safety: Goal: Ability to remain free from injury will improve 10/23/2020 1130 by Cristela Blue, RN Outcome: Adequate for Discharge 10/23/2020 1130 by Cristela Blue, RN Outcome: Adequate for Discharge   Problem: Skin Integrity: Goal: Risk for impaired skin integrity will decrease 10/23/2020 1130 by Cristela Blue, RN Outcome: Adequate for Discharge 10/23/2020 1130 by Cristela Blue, RN Outcome: Adequate for Discharge   Problem: Education: Goal: Ability to demonstrate management of disease process will improve 10/23/2020 1130 by Cristela Blue, RN Outcome: Adequate for Discharge 10/23/2020 1130 by Cristela Blue, RN Outcome: Adequate for Discharge Goal: Ability to verbalize understanding of medication therapies will improve 10/23/2020 1130 by Cristela Blue, RN Outcome: Adequate for Discharge 10/23/2020 1130 by Cristela Blue, RN Outcome: Adequate for Discharge Goal: Individualized Educational Video(s) 10/23/2020 1130 by  Cristela Blue, RN Outcome: Adequate for Discharge 10/23/2020 1130 by Cristela Blue, RN Outcome: Adequate for Discharge   Problem: Activity: Goal: Capacity to carry out activities will improve 10/23/2020 1130 by Cristela Blue, RN Outcome: Adequate for Discharge 10/23/2020 1130 by Cristela Blue, RN Outcome: Adequate for Discharge   Problem: Cardiac: Goal: Ability to achieve and maintain adequate cardiopulmonary perfusion  will improve 10/23/2020 1130 by Cristela Blue, RN Outcome: Adequate for Discharge 10/23/2020 1130 by Cristela Blue, RN Outcome: Adequate for Discharge

## 2020-10-23 NOTE — Discharge Summary (Signed)
Physician Discharge Summary  Kayla Maxwell KWI:[id number] DOB: [birth year] DOA: 10/15/2020  PCP: Gwyneth Sprout, FNP  Admit date: 10/15/2020.   Discharge date: 10/23/2020  Admitted From: Home.  Disposition:  SNF [facility]  Recommendations for Outpatient Follow-up:  Follow up with PCP in 1-2 weeks. Please obtain BMP/CBC in one week. Advised to take vitamin M42, folic acid and vitamin D as prescribed. Advised to follow-up with ENT as scheduled.  Home Health: None Equipment/Devices: Home oxygen  Discharge Condition: Stable CODE STATUS:DNR Diet recommendation: Heart Healthy   Brief Summary / Hospital Course: This 75 years old female with past medical history significant of atrial fibrillation, congestive heart failure, COPD, hyperlipidemia, hypertension and sleep apnea with history of a stroke and TIA presented to the hospital with complaints of dizziness, generalized weakness and near syncopal episodes for 1 week.  She was also short of breath and required supplemental oxygen enroute to the hospital.  She was initially noted to be hypoxic with pulse ox of 77% on 4 L of oxygen.  She did have her right foot which was erythematous and warm to touch concerning for possible cellulitis.  BNP was elevated at 967.  EKG showed atrial fibrillation.  Chest x-ray showed mild to moderate CHF.  Patient was admitted in the  hospital for possible pneumonia, cellulitis and CHF/COPD exacerbation. During hospitalization, patient has received IV Lasix and BiPAP at nighttime and has been continued on Lasix.  She is [place] on supplemental oxygen but has continued to feel better.  She is currently awaiting for skilled nursing facility placement.   She was managed for below problems.   Discharge Diagnoses:  Active Problems:   Acute respiratory failure with hypoxia (HCC)   Pressure injury of skin   Epistaxis  Acute hypoxic and hypercapnic respiratory failure secondary to COPD and CHF  exacerbation: Patient has been noncompliant with CPAP. Presented with high PCO2, low PO2. Patient has received IV Lasix and BiPAP during the course.  Continue supplemental oxygen. Continue nebulizers, diuretics.  Advised outpatient sleep studies.  Wean oxygen as tolerated..   Repeat X-ray showed decrease in pleural effusion and improvement in chest x-ray.    Bilateral leg erythema: Cellulitis is ruled out.  Blood cultures negative so far. Patient was initially started on Azactam due to penicillin allergy which was then discontinued.   Acute on chronic systolic CHF: Last LVEF 68%  in 05/2020 Continue Lasix 20 mg IV twice daily, tolerating well. Continue metoprolol as tolerated.  Lisinopril on hold due to AKI but initiated as renal functions stable. Recent echocardiogram from 10/16/2020 showing LV ejection fraction of 45 to 50%.   Cardiology was consulted.  Continue diuresis.   Epistaxis: Patient had overnight nosebleed which stopped with Afrin) nasal packing ENT consulted.  Follow-up outpatient.   Acute metabolic encephalopathy due to hypercapnia: Improved with BiPAP and encouraged to use especially during night and as needed. Patient is noncompliant with CPAP at home,  advised outpatient sleep studies    Bilateral lower extremity neuropathy: ABI within normal limits.  Ultrasound negative for DVT Neuropathy could be due to vitamin B12 deficiency. Follow-up Dr. Delana Meyer as outpatient   B12 deficiency: Continue vitamin B12 1000 mcg IM injection daily for 7 days followed by oral supplement.   Iron deficiency Continue ferrous sulfate.   Vitamin D deficiency: Continue vitamin D once a week   Stage I sacral pressure ulceration.  Present on admission.  Continue preventive protocol. Pressure Injury 10/17/20 Sacrum Mid Stage 1 -  Intact  skin with non-blanchable redness of a localized area usually over a bony prominence. red (Active)  10/17/20 1459  Location: Sacrum  Location  Orientation: Mid  Staging: Stage 1 -  Intact skin with non-blanchable redness of a localized area usually over a bony prominence.  Wound Description (Comments): red  Present on Admission: Yes      Type 2 diabetes mellitus: Diet controlled, continue sliding scale..   Deconditioning, debility.   Patient has been seen by physical therapy who recommended skilled nursing facility placement on discharge.       Discharge Instructions  Discharge Instructions     Call MD for:  difficulty breathing, headache or visual disturbances   Complete by: As directed    Call MD for:  persistant dizziness or light-headedness   Complete by: As directed    Call MD for:  persistant nausea and vomiting   Complete by: As directed    Diet - low sodium heart healthy   Complete by: As directed    Diet - low sodium heart healthy   Complete by: As directed    Diet Carb Modified   Complete by: As directed    Discharge instructions   Complete by: As directed    Advised to follow-up with primary care physician in 1 week. Advised to take vitamin Y65, folic acid and vitamin D as prescribed.   Discharge wound care:   Complete by: As directed    Follow up [facility] at [facility]   Discharge wound care:   Complete by: As directed    F/u wound care at SNF   Increase activity slowly   Complete by: As directed    Increase activity slowly   Complete by: As directed       Allergies as of 10/23/2020       Reactions   Codeine Other (See Comments)   Makes her feel "crazy"   Penicillins Swelling   Did it involve swelling of the face/tongue/throat, SOB, or low BP? Unknown Did it involve sudden or severe rash/hives, skin peeling, or any reaction on the inside of your mouth or nose? No Did you need to seek medical attention at a hospital or doctor's office? Unknown When did it last happen?      Childhood allergy If all above answers are "NO", may proceed with cephalosporin use.   Latex    Adhesive [tape] Hives    (Latex)   Sulfa Antibiotics Nausea Only, Rash        Medication List     STOP taking these medications    omeprazole 40 MG capsule Commonly known as: PRILOSEC Replaced by: pantoprazole 40 MG tablet       TAKE these medications    albuterol 108 (90 Base) MCG/ACT inhaler Commonly known as: VENTOLIN HFA Inhale 2 puffs into the lungs every 6 (six) hours as needed for wheezing or shortness of breath.   clopidogrel 75 MG tablet Commonly known as: PLAVIX Take 75 mg by mouth daily.   famotidine 20 MG tablet Commonly known as: PEPCID Take 1 tablet (20 mg total) by mouth 2 (two) times daily.   FLUoxetine 20 MG capsule Commonly known as: PROZAC Take 1 capsule (20 mg total) by mouth daily.   folic acid 1 MG tablet Commonly known as: FOLVITE Take 1 tablet (1 mg total) by mouth daily. Start taking on: October 24, 2020   furosemide 20 MG tablet Commonly known as: LASIX Take 1 tablet (20 mg total) by mouth daily as needed  for fluid or edema.   gabapentin 300 MG capsule Commonly known as: NEURONTIN Take 2 capsules (600 mg total) by mouth at bedtime.   HYDROcodone-acetaminophen 5-325 MG tablet Commonly known as: Norco Take 1-2 tablets by mouth 2 (two) times daily as needed for moderate pain. Do not fill within 30 days of previous refill. Thank you.   lisinopril 2.5 MG tablet Commonly known as: ZESTRIL TAKE 1 TABLET BY MOUTH EVERY DAY   metoprolol tartrate 25 MG tablet Commonly known as: LOPRESSOR Take 0.5 tablets (12.5 mg total) by mouth 2 (two) times daily. What changed: how much to take   nitroGLYCERIN 0.4 MG SL tablet Commonly known as: NITROSTAT Place 1 tablet (0.4 mg total) under the tongue every 5 (five) minutes x 3 doses as needed for chest pain.   ondansetron 4 MG disintegrating tablet Commonly known as: Zofran ODT Take 1 tablet (4 mg total) by mouth every 8 (eight) hours as needed for nausea or vomiting.   oxymetazoline 0.05 % nasal spray Commonly known  as: AFRIN Place 1 spray into both nostrils 2 (two) times daily.   pantoprazole 40 MG tablet Commonly known as: PROTONIX Take 1 tablet (40 mg total) by mouth daily. Start taking on: October 24, 2020 Replaces: omeprazole 40 MG capsule   rivaroxaban 20 MG Tabs tablet Commonly known as: XARELTO Take 20 mg by mouth daily with supper.   rosuvastatin 20 MG tablet Commonly known as: CRESTOR Take 1 tablet (20 mg total) by mouth daily.   traZODone 100 MG tablet Commonly known as: DESYREL Take 0.5 tablets (50 mg total) by mouth at bedtime.   vitamin B-12 500 MCG tablet Commonly known as: CYANOCOBALAMIN Take 1 tablet (500 mcg total) by mouth daily. Start taking on: October 24, 2020   Vitamin D (Ergocalciferol) 1.25 MG (50000 UNIT) Caps capsule Commonly known as: DRISDOL Take 1 capsule (50,000 Units total) by mouth every 7 (seven) days. Start taking on: October 24, 2020               Discharge Care Instructions  (From admission, onward)           Start     Ordered   10/23/20 0000  Discharge wound care:       Comments: Follow up wound care at [facility]   10/23/20 1128   10/23/20 0000  Discharge wound care:       Comments: F/u wound care at [facility]   10/23/20 1136            Contact information for follow-up providers     Tally Joe T, FNP Follow up in 1 week(s).   Specialty: Family Medicine Contact information: [place] [address] [phone]         Beverly Gust, MD Follow up in 1 week(s).   Specialty: Otolaryngology Contact information: [place] [address] [phone]              Contact information for after-discharge care     Destination     [facility] Preferred SNF .   Service: Skilled Nursing Contact information: [place] [address] [phone]                    Allergies  Allergen Reactions   Codeine Other  (See Comments)    Makes her feel "crazy"   Penicillins Swelling    Did it involve swelling of the face/tongue/throat, SOB, or low  BP? Unknown Did it involve sudden or severe rash/hives, skin peeling, or any reaction on the inside of your mouth or nose? No Did you need to seek medical attention at a hospital or doctor's office? Unknown When did it last happen?      Childhood allergy If all above answers are "NO", may proceed with cephalosporin use.    Latex    Adhesive [Tape] Hives    (Latex)   Sulfa Antibiotics Nausea Only and Rash    Consultations: None   Procedures/Studies: CT HEAD WO CONTRAST  Result Date: 10/15/2020 CLINICAL DATA:  Dizziness.  Nonspecific. EXAM: CT HEAD WITHOUT CONTRAST TECHNIQUE: Contiguous axial images were obtained from the base of the skull through the vertex without intravenous contrast. COMPARISON:  None. BRAIN: BRAIN Chronic left frontoparietal encephalomalacia again noted. No evidence of large-territorial acute infarction. No parenchymal hemorrhage. No mass lesion. No extra-axial collection. No mass effect or midline shift. No hydrocephalus. Basilar cisterns are patent. Vascular: No hyperdense vessel. Atherosclerotic calcifications are present within the cavernous internal carotid arteries. Skull: No acute fracture or focal lesion. Sinuses/Orbits: Paranasal sinuses and mastoid air cells are clear. Bilateral lens replacement. Orbits are unremarkable. Other: None. IMPRESSION: No acute intracranial abnormality. Electronically Signed   By: Iven Finn M.D.   On: 10/15/2020 19:41   CT CHEST WO CONTRAST  Result Date: 10/19/2020 CLINICAL DATA:  Respiratory failure EXAM: CT CHEST WITHOUT CONTRAST TECHNIQUE: Multidetector CT imaging of the chest was performed following the standard protocol without IV contrast. COMPARISON:  None. FINDINGS: Cardiovascular: Aortic atherosclerosis. Cardiomegaly. Three-vessel coronary artery calcifications and/or stents. Small  pericardial effusion. Mediastinum/Nodes: No enlarged mediastinal, hilar, or axillary lymph nodes. Thyroid gland, trachea, and esophagus demonstrate no significant findings. Lungs/Pleura: Large right, moderate left pleural effusions and associated atelectasis or consolidation. Moderate centrilobular emphysema. Upper Abdomen: No acute abnormality. Musculoskeletal: No chest wall mass or suspicious bone lesions identified. Calcified bilateral prepectoral breast implants. IMPRESSION: 1. Large right, moderate left pleural effusions and associated atelectasis or consolidation. 2. Moderate centrilobular emphysema. 3. Cardiomegaly and coronary artery disease. Small pericardial effusion. Aortic Atherosclerosis (ICD10-I70.0) and Emphysema (ICD10-J43.9). Electronically Signed   By: Delanna Ahmadi M.D.   On: 10/19/2020 16:11   DG Hand 2 View Right  Result Date: 10/15/2020 CLINICAL DATA:  [place] today.  Swelling of the right hand. EXAM: RIGHT HAND - 2 VIEW COMPARISON:  Right forearm 07/11/2020 FINDINGS: Plate and screw fixation of an old fracture deformity of the distal right radial metaphysis. Fracture line remains present, most likely due to incomplete healing rather than new fracture. The surgical hardware appears intact. There is an ununited ossicle at the ulnar styloid process which is also old. Diffuse bone demineralization. No additional fractures are identified. Soft tissue swelling over the dorsum of the right hand and wrist. No radiopaque soft tissue foreign bodies or soft tissue gas. Degenerative changes in the interphalangeal joints and STT joints. IMPRESSION: Previous internal fixation of an old fracture of the distal right radial metaphysis. Fracture line remains present likely indicating incomplete union. 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 Degenerative changes. Dorsal soft tissue swelling. Electronically Signed   By: Lucienne Capers M.D.   On: 10/15/2020 20:57   US Venous Img Lower Bilateral  (DVT)  Result Date: 10/16/2020 CLINICAL DATA:  Cellulitis of right lower extremity L03.115 (ICD-10-CM) PVD (peripheral vascular disease) (HCC) I73.9 (ICD-10-CM) Redness and swelling of lower leg M79.89, R23.8 (ICD-10-CM) EXAM: BILATERAL LOWER EXTREMITY VENOUS DOPPLER ULTRASOUND TECHNIQUE: Gray-scale sonography with graded compression, as well  as color Doppler and duplex ultrasound were performed to evaluate the lower extremity deep venous systems from the level of the common femoral vein and including the common femoral, femoral, profunda femoral, popliteal and calf veins including the posterior tibial, peroneal and gastrocnemius veins when visible. The superficial great saphenous vein was also interrogated. Spectral Doppler was utilized to evaluate flow at rest and with distal augmentation maneuvers in the common femoral, femoral and popliteal veins. COMPARISON:  None. FINDINGS: Limitations: Difficult study due to uncontrollable patient motion. RIGHT LOWER EXTREMITY Common Femoral Vein: No evidence of thrombus. Normal compressibility, respiratory phasicity and response to augmentation. Saphenofemoral Junction: No evidence of thrombus. Normal compressibility and flow on color Doppler imaging. Profunda Femoral Vein: No evidence of thrombus. Normal compressibility and flow on color Doppler imaging. Femoral Vein: No evidence of thrombus. Normal compressibility, respiratory phasicity and response to augmentation. Popliteal Vein: No evidence of thrombus. Normal compressibility, respiratory phasicity and response to augmentation. Calf Veins: No evidence of thrombus. Normal compressibility and flow on color Doppler imaging. Superficial Great Saphenous Vein: No evidence of thrombus. Normal compressibility. LEFT LOWER EXTREMITY Common Femoral Vein: No evidence of thrombus. Normal compressibility, respiratory phasicity and response to augmentation. Saphenofemoral Junction: No evidence of thrombus. Normal compressibility  and flow on color Doppler imaging. Profunda Femoral Vein: No evidence of thrombus. Normal compressibility and flow on color Doppler imaging. Femoral Vein: No evidence of thrombus. Normal compressibility, respiratory phasicity and response to augmentation. Popliteal Vein: No evidence of thrombus. Normal compressibility, respiratory phasicity and response to augmentation. Calf Veins: No evidence of thrombus. Normal compressibility and flow on color Doppler imaging. Superficial Great Saphenous Vein: No evidence of thrombus. Normal compressibility. IMPRESSION: No evidence of deep venous thrombosis in either lower extremity. Electronically Signed   By: Margaretha Sheffield M.D.   On: 10/16/2020 10:38   US ARTERIAL ABI (SCREENING LOWER EXTREMITY)  Result Date: 10/16/2020 CLINICAL DATA:  Peripheral arterial disease. History of bilateral common iliac artery stents. EXAM: NONINVASIVE PHYSIOLOGIC VASCULAR STUDY OF BILATERAL LOWER EXTREMITIES TECHNIQUE: Evaluation of both lower extremities were performed at rest, including calculation of ankle-brachial indices with single level Doppler, pressure and pulse volume recording. COMPARISON:  CT 06/20/2020 FINDINGS: Right ABI:  1.21 Left ABI:  1.09 Right Lower Extremity: Irregularity with some triphasic Doppler waveforms. Left Lower Extremity: Irregularity with some triphasic Doppler waveforms. 1.0-1.4 Normal IMPRESSION: Normal resting ankle-brachial indices. Electronically Signed   By: Markus Daft M.D.   On: 10/16/2020 16:49   DG Chest Port 1 View  Result Date: 10/21/2020 CLINICAL DATA:  Pleural effusion. EXAM: PORTABLE CHEST 1 VIEW COMPARISON:  One-view chest x-ray 10/19/2020 FINDINGS: Heart is enlarged. Atherosclerotic calcifications are present. Interstitial edema is slightly improved. Bilateral effusions and basilar airspace disease remain, right greater than left. Effusions are slightly decreased as well. IMPRESSION: 1. Slight decrease in interstitial edema and bilateral  effusions. 2. Persistent bibasilar airspace disease, right greater than left. Electronically Signed   By: San Morelle M.D.   On: 10/21/2020 12:17   DG Chest Port 1 View  Result Date: 10/19/2020 CLINICAL DATA:  Shortness of breath, productive cough. EXAM: PORTABLE CHEST 1 VIEW COMPARISON:  Chest x-rays dated 10/16/2020 and 10/15/2020. FINDINGS: Increasing bilateral perihilar and bibasilar airspace opacities. Stable cardiomegaly. No pneumothorax is seen. Suspect small bilateral pleural effusions. IMPRESSION: Increasing bilateral perihilar and bibasilar airspace opacities, compatible with multifocal pneumonia versus pulmonary edema. Probable small bilateral pleural effusions. Electronically Signed   By: Franki Cabot M.D.   On: 10/19/2020 09:23  DG Chest Port 1 View  Result Date: 10/16/2020 CLINICAL DATA:  CHF EXAM: PORTABLE CHEST 1 VIEW COMPARISON:  10/15/2020 FINDINGS: Cardiomegaly and vascular pedicle widening with hazy density at the bases from pleural fluid and atelectasis at least. Congestive appearance of hilar vessels. Diffuse atheromatous calcification of the aorta. No pneumothorax. IMPRESSION: Continued congestion/edema with pleural effusions and presumed atelectasis at the bases. Electronically Signed   By: Jorje Guild M.D.   On: 10/16/2020 04:39   DG Chest Port 1 View  Result Date: 10/15/2020 CLINICAL DATA:  Fall, dizziness EXAM: PORTABLE CHEST 1 VIEW COMPARISON:  06/20/2020 FINDINGS: Lung volumes are small and pulmonary insufflation has diminished when compared to prior examination. Interval development of mild bilateral perihilar interstitial pulmonary infiltrate, most in keeping with interstitial pulmonary edema, as well as small bilateral pleural effusions, right greater than left. Cardiomegaly is stable. No pneumothorax. No acute bone abnormality. Bilateral calcified breast implants noted. IMPRESSION: Interval development of mild to moderate CHF.  Stable cardiomegaly  Pulmonary hypoinflation Electronically Signed   By: Fidela Salisbury M.D.   On: 10/15/2020 20:45   ECHOCARDIOGRAM COMPLETE  Result Date: 10/16/2020    ECHOCARDIOGRAM REPORT   Patient Name:   Kayla Maxwell Date of Exam: 10/16/2020 Medical Rec #:  [id number]      Height:       65.0 in Accession #:    [id number]     Weight:       160.0 lb Date of Birth:  11/27/1945      BSA:          1.799 m Patient Age:    28 years       BP:           103/73 mmHg Patient Gender: F              HR:           94 bpm. Exam Location:  [facility] Procedure: 2D Echo, Color Doppler and Cardiac Doppler Indications:     I50.21 congestive heart failure-Acute Systolic  History:         Patient has prior history of Echocardiogram examinations, most                  recent 02/12/2020. CHF, CAD, Signs/Symptoms:Shortness of Breath                  and Edema; Risk Factors:Hypertension, Dyslipidemia and Sleep                  Apnea.  Sonographer:     Charmayne Sheer Referring Phys:  [place] Diagnosing Phys: Isaias Cowman MD IMPRESSIONS  1. Left ventricular ejection fraction, by estimation, is 45 to 50%. The left ventricle has mildly decreased function. The left ventricle has no regional wall motion abnormalities. Left ventricular diastolic parameters are indeterminate.  2. Right ventricular systolic function is normal. The right ventricular size is normal.  3. A small pericardial effusion is present.  4. The mitral valve is normal in structure. Mild mitral valve regurgitation. No evidence of mitral stenosis.  5. The aortic valve is normal in structure. Aortic valve regurgitation is not visualized. No aortic stenosis is present.  6. The inferior vena cava is normal in size with greater than 50% respiratory variability, suggesting right atrial pressure of 3 mmHg. FINDINGS  Left Ventricle: Left ventricular ejection fraction, by estimation, is 45 to 50%. The left ventricle has mildly decreased function. The left ventricle has no regional  wall motion abnormalities. The left ventricular internal cavity size was normal in size. There is no left ventricular hypertrophy. Left ventricular diastolic parameters are indeterminate. Right Ventricle: The right ventricular size is normal. No increase in right ventricular wall thickness. Right ventricular systolic function is normal. Left Atrium: Left atrial size was normal in size. Right Atrium: Right atrial size was normal in size. Pericardium: A small pericardial effusion is present. Mitral Valve: The mitral valve is normal in structure. Mild mitral valve regurgitation. No evidence of mitral valve stenosis. MV peak gradient, 4.8 mmHg. The mean mitral valve gradient is 2.0 mmHg. Tricuspid Valve: The tricuspid valve is normal in structure. Tricuspid valve regurgitation is mild . No evidence of tricuspid stenosis. Aortic Valve: The aortic valve is normal in structure. Aortic valve regurgitation is not visualized. No aortic stenosis is present. Aortic valve mean gradient measures 4.0 mmHg. Aortic valve peak gradient measures 6.5 mmHg. Aortic valve area, by VTI measures 1.89 cm. Pulmonic Valve: The pulmonic valve was normal in structure. Pulmonic valve regurgitation is not visualized. No evidence of pulmonic stenosis. Aorta: The aortic root is normal in size and structure. Venous: The inferior vena cava is normal in size with greater than 50% respiratory variability, suggesting right atrial pressure of 3 mmHg. IAS/Shunts: No atrial level shunt detected by color flow Doppler.  LEFT VENTRICLE PLAX 2D LVIDd:         4.50 cm   Diastology LVIDs:         2.98 cm   LV e' medial:    7.40 cm/s LV PW:         1.24 cm   LV E/e' medial:  14.3 LV IVS:        0.75 cm   LV e' lateral:   6.74 cm/s LVOT diam:     1.90 cm   LV E/e' lateral: 15.7 LV SV:         44 LV SV Index:   24 LVOT Area:     2.84 cm  RIGHT VENTRICLE RV Basal diam:  3.59 cm LEFT ATRIUM           Index        RIGHT ATRIUM           Index LA diam:      5.90 cm  3.28 cm/m   RA Area:     22.00 cm LA Vol (A2C): 41.5 ml 23.07 ml/m  RA Volume:   58.70 ml  32.63 ml/m LA Vol (A4C): 84.1 ml 46.74 ml/m  AORTIC VALVE                    PULMONIC VALVE AV Area (Vmax):    1.90 cm     PV Vmax:       0.66 m/s AV Area (Vmean):   1.88 cm     PV Vmean:      44.600 cm/s AV Area (VTI):     1.89 cm     PV VTI:        0.102 m AV Vmax:           127.00 cm/s  PV Peak grad:  1.7 mmHg AV Vmean:          88.200 cm/s  PV Mean grad:  1.0 mmHg AV VTI:            0.231 m AV Peak Grad:      6.5 mmHg AV Mean Grad:      4.0 mmHg LVOT  Vmax:         84.90 cm/s LVOT Vmean:        58.500 cm/s LVOT VTI:          0.154 m LVOT/AV VTI ratio: 0.67  AORTA Ao Root diam: 2.80 cm MITRAL VALVE                TRICUSPID VALVE MV Area (PHT): 5.22 cm     TR Peak grad:   29.8 mmHg MV Area VTI:   1.51 cm     TR Vmax:        273.00 cm/s MV Peak grad:  4.8 mmHg MV Mean grad:  2.0 mmHg     SHUNTS MV Vmax:       1.09 m/s     Systemic VTI:  0.15 m MV Vmean:      53.9 cm/s    Systemic Diam: 1.90 cm MV Decel Time: 145 msec MV E velocity: 105.67 cm/s Isaias Cowman MD Electronically signed by Isaias Cowman MD Signature Date/Time: 10/16/2020/1:32:50 PM    Final       Subjective: Patient was seen and examined at bedside.  Overnight events noted.   Patient reports feeling much improved and she wants to be discharged.   Patient is being discharged to skilled nursing facility for rehab.  Discharge Exam: Vitals:   10/23/20 0955 10/23/20 1148  BP:  (!) 111/54  Pulse: (!) 109 85  Resp:  18  Temp:  98.2 F (36.8 C)  SpO2:  96%   Vitals:   10/23/20 0534 10/23/20 0746 10/23/20 0955 10/23/20 1148  BP:  121/73  (!) 111/54  Pulse:  60 (!) 109 85  Resp:  18  18  Temp:  98.4 F (36.9 C)  98.2 F (36.8 C)  TempSrc:      SpO2:  97%  96%  Weight: 80 kg     Height:        General: Appears comfortable, not in any acute distress. Cardiovascular: Regular rate and rhythm, S1-S2 heard, no  murmur Respiratory: CTA bilaterally, no wheezing, no rhonchi Abdominal: Soft, NT, ND, bowel sounds + Extremities: no edema, no cyanosis    The results of significant diagnostics from this hospitalization (including imaging, microbiology, ancillary and laboratory) are listed below for reference.     Microbiology: Recent Results (from the past 240 hour(s))  Blood culture (routine single)     Status: None   Collection Time: 10/15/20  8:49 PM   Specimen: BLOOD  Result Value Ref Range Status   Specimen Description BLOOD RIGHT ANTECUBITAL  Final   Special Requests   Final    BOTTLES DRAWN AEROBIC AND ANAEROBIC Blood Culture adequate volume   Culture   Final    NO GROWTH 5 DAYS Performed at [facility], [address], [place], [place] [address]    Report Status 10/20/2020 FINAL  Final  Urine Culture     Status: None   Collection Time: 10/15/20  8:50 PM   Specimen: Urine, Random  Result Value Ref Range Status   Specimen Description   Final    URINE, RANDOM Performed at [facility], [address] [place], [place] [address]    Special Requests   Final    NONE Performed at [facility], [address], [place], [place] [address]    Culture   Final    NO GROWTH Performed at [facility], [place] [address] [place], [place] [address]    Report  Status 10/17/2020 FINAL  Final  Resp Panel by RT-PCR (Flu A&B, Covid) Nasopharyngeal Swab     Status: None   Collection Time: 10/15/20  8:50 PM   Specimen: Nasopharyngeal Swab; Nasopharyngeal(NP) swabs in vial transport medium  Result Value Ref Range Status   SARS Coronavirus 2 by RT PCR NEGATIVE NEGATIVE Final    Comment: (NOTE) SARS-CoV-2 target nucleic acids are NOT DETECTED.  The SARS-CoV-2 RNA is generally detectable in upper respiratory specimens during the acute phase of infection. The lowest concentration of SARS-CoV-2 viral copies this assay can detect is 138 copies/mL. A negative result does  not preclude SARS-Cov-2 infection and should not be used as the sole basis for treatment or other patient management decisions. A negative result may occur with  improper specimen collection/handling, submission of specimen other than nasopharyngeal swab, presence of viral mutation(s) within the areas targeted by this assay, and inadequate number of viral copies(<138 copies/mL). A negative result must be combined with clinical observations, patient history, and epidemiological information. The expected result is Negative.  Fact Sheet for Patients:  EntrepreneurPulse.com.au  Fact Sheet for Healthcare Providers:  IncredibleEmployment.be  This test is no t yet approved or cleared by the [place] FDA and  has been authorized for detection and/or diagnosis of SARS-CoV-2 by FDA under an Emergency Use Authorization (EUA). This EUA will remain  in effect (meaning this test can be used) for the duration of the COVID-19 declaration under Section 564(b)(1) of the Act, 21 U.S.C.section 360bbb-3(b)(1), unless the authorization is terminated  or revoked sooner.       Influenza A by PCR NEGATIVE NEGATIVE Final   Influenza B by PCR NEGATIVE NEGATIVE Final    Comment: (NOTE) The Xpert Xpress SARS-CoV-2/FLU/RSV plus assay is intended as an aid in the diagnosis of influenza from Nasopharyngeal swab specimens and should not be used as a sole basis for treatment. Nasal washings and aspirates are unacceptable for Xpert Xpress SARS-CoV-2/FLU/RSV testing.  Fact Sheet for Patients: EntrepreneurPulse.com.au  Fact Sheet for Healthcare Providers: IncredibleEmployment.be  This test is not yet approved or cleared by the [place] FDA and has been authorized for detection and/or diagnosis of SARS-CoV-2 by FDA under an Emergency Use Authorization (EUA). This EUA will remain in effect (meaning this test can be used) for the  duration of the COVID-19 declaration under Section 564(b)(1) of the Act, 21 U.S.C. section 360bbb-3(b)(1), unless the authorization is terminated or revoked.  Performed at [facility], [address] [place], [place] [address]      Labs: BNP (last 3 results) Recent Labs    02/12/20 1120 10/15/20 2026  BNP 794.8* 093.2*   Basic Metabolic Panel: Recent Labs  Lab 10/17/20 0622 10/18/20 0450 10/19/20 0522 10/20/20 0502 10/21/20 0452 10/22/20 0609 10/23/20 0512  NA 140 139 137 138 138 138 139  K 3.5 4.3 4.6 4.1 3.9 3.8 3.8  CL 96* 98 95* 93* 92* 93* 93*  CO2 36* 36* 38* 40* 38* 41* 40*  GLUCOSE 100* 122* 124* 104* 81 99 98  BUN 25* 22 26* 24* 29* 31* 26*  CREATININE 1.11* 0.97 0.92 1.01* 1.08* 1.03* 0.78  CALCIUM 8.3* 8.6* 9.1 8.7* 8.4* 8.5* 8.7*  MG 1.8 1.8 2.2  --   --  2.1 2.1  PHOS 3.9 2.4* 3.5  --   --   --  3.7   Liver Function Tests: No results for input(s): AST, ALT, ALKPHOS, BILITOT, PROT, ALBUMIN in the last 168 hours. No results for input(s):  LIPASE, AMYLASE in the last 168 hours. No results for input(s): AMMONIA in the last 168 hours. CBC: Recent Labs  Lab 10/19/20 0522 10/20/20 0502 10/21/20 0452 10/22/20 0609 10/23/20 0512  WBC 11.1* 9.0 8.6 9.0 8.9  HGB 9.9* 8.8* 8.7* 9.3* 9.3*  HCT 37.1 31.7* 32.7* 34.0* 33.0*  MCV 81.0 80.5 81.1 81.5 83.3  PLT 228 184 168 172 169   Cardiac Enzymes: No results for input(s): CKTOTAL, CKMB, CKMBINDEX, TROPONINI in the last 168 hours. BNP: Invalid input(s): POCBNP CBG: Recent Labs  Lab 10/16/20 1635 10/16/20 2016  GLUCAP 176* 108*   D-Dimer No results for input(s): DDIMER in the last 72 hours. Hgb A1c No results for input(s): HGBA1C in the last 72 hours. Lipid Profile No results for input(s): CHOL, HDL, LDLCALC, TRIG, CHOLHDL, LDLDIRECT in the last 72 hours. Thyroid function studies No results for input(s): TSH, T4TOTAL, T3FREE, THYROIDAB in the last 72 hours.  Invalid input(s):  FREET3 Anemia work up No results for input(s): VITAMINB12, FOLATE, FERRITIN, TIBC, IRON, RETICCTPCT in the last 72 hours. Urinalysis    Component Value Date/Time   COLORURINE YELLOW (A) 10/15/2020 2050   APPEARANCEUR CLEAR (A) 10/15/2020 2050   APPEARANCEUR Turbid (A) 10/29/2014 0000   LABSPEC 1.016 10/15/2020 2050   LABSPEC 1.012 11/09/2013 1155   PHURINE 5.0 10/15/2020 2050   GLUCOSEU NEGATIVE 10/15/2020 2050   GLUCOSEU Negative 11/09/2013 1155   HGBUR NEGATIVE 10/15/2020 2050   BILIRUBINUR NEGATIVE 10/15/2020 2050   BILIRUBINUR neg 10/29/2014 1103   BILIRUBINUR Negative 10/29/2014 0000   BILIRUBINUR Negative 11/09/2013 [place] 10/15/2020 2050   PROTEINUR 30 (A) 10/15/2020 2050   UROBILINOGEN 0.2 10/29/2014 1103   NITRITE NEGATIVE 10/15/2020 2050   LEUKOCYTESUR NEGATIVE 10/15/2020 2050   LEUKOCYTESUR Negative 11/09/2013 1155   Sepsis Labs Invalid input(s): PROCALCITONIN,  WBC,  LACTICIDVEN Microbiology Recent Results (from the past 240 hour(s))  Blood culture (routine single)     Status: None   Collection Time: 10/15/20  8:49 PM   Specimen: BLOOD  Result Value Ref Range Status   Specimen Description BLOOD RIGHT ANTECUBITAL  Final   Special Requests   Final    BOTTLES DRAWN AEROBIC AND ANAEROBIC Blood Culture adequate volume   Culture   Final    NO GROWTH 5 DAYS Performed at [facility], [address] [place], [place] [address]    Report Status 10/20/2020 FINAL  Final  Urine Culture     Status: None   Collection Time: 10/15/20  8:50 PM   Specimen: Urine, Random  Result Value Ref Range Status   Specimen Description   Final    URINE, RANDOM Performed at [facility], [address] [place], [place] [address]    Special Requests   Final    NONE Performed at [facility], [address] [place], [place] [address]    Culture   Final    NO GROWTH Performed at [facility], [place] [address], [place],  [place] [address]    Report Status 10/17/2020 FINAL  Final  Resp Panel by RT-PCR (Flu A&B, Covid) Nasopharyngeal Swab     Status: None   Collection Time: 10/15/20  8:50 PM   Specimen: Nasopharyngeal Swab; Nasopharyngeal(NP) swabs in vial transport medium  Result Value Ref Range Status   SARS Coronavirus 2 by RT PCR NEGATIVE NEGATIVE Final    Comment: (NOTE) SARS-CoV-2 target nucleic acids are NOT DETECTED.  The SARS-CoV-2 RNA is generally detectable in  upper respiratory specimens during the acute phase of infection. The lowest concentration of SARS-CoV-2 viral copies this assay can detect is 138 copies/mL. A negative result does not preclude SARS-Cov-2 infection and should not be used as the sole basis for treatment or other patient management decisions. A negative result may occur with  improper specimen collection/handling, submission of specimen other than nasopharyngeal swab, presence of viral mutation(s) within the areas targeted by this assay, and inadequate number of viral copies(<138 copies/mL). A negative result must be combined with clinical observations, patient history, and epidemiological information. The expected result is Negative.  Fact Sheet for Patients:  EntrepreneurPulse.com.au  Fact Sheet for Healthcare Providers:  IncredibleEmployment.be  This test is no t yet approved or cleared by the [place] FDA and  has been authorized for detection and/or diagnosis of SARS-CoV-2 by FDA under an Emergency Use Authorization (EUA). This EUA will remain  in effect (meaning this test can be used) for the duration of the COVID-19 declaration under Section 564(b)(1) of the Act, 21 U.S.C.section 360bbb-3(b)(1), unless the authorization is terminated  or revoked sooner.       Influenza A by PCR NEGATIVE NEGATIVE Final   Influenza B by PCR NEGATIVE NEGATIVE Final    Comment: (NOTE) The Xpert Xpress SARS-CoV-2/FLU/RSV plus assay is intended as  an aid in the diagnosis of influenza from Nasopharyngeal swab specimens and should not be used as a sole basis for treatment. Nasal washings and aspirates are unacceptable for Xpert Xpress SARS-CoV-2/FLU/RSV testing.  Fact Sheet for Patients: EntrepreneurPulse.com.au  Fact Sheet for Healthcare Providers: IncredibleEmployment.be  This test is not yet approved or cleared by the [place] FDA and has been authorized for detection and/or diagnosis of SARS-CoV-2 by FDA under an Emergency Use Authorization (EUA). This EUA will remain in effect (meaning this test can be used) for the duration of the COVID-19 declaration under Section 564(b)(1) of the Act, 21 U.S.C. section 360bbb-3(b)(1), unless the authorization is terminated or revoked.  Performed at [facility], [address], [place], [place] [address]      Time coordinating discharge: Over 30 minutes  SIGNED:   Shawna Clamp, MD  [facility] 10/23/2020, 2:04 PM Pager   If 7PM-7AM, please contact night-coverage

## 2020-10-23 NOTE — Care Management Important Message (Signed)
Important Message  Patient Details  Name: Kayla Maxwell MRN: [id number] Date of Birth: 02-May-1945   Medicare Important Message Given:  Yes     Dannette Barbara 10/23/2020, 2:38 PM

## 2020-10-23 NOTE — Consult Note (Signed)
[facility] Inpatient Consult   10/23/2020  Iliany Losier Hamrick May 27, 1945 [id number]  Patient chart reviewed for [place] services with noted high risk score for unplanned readmission. Per review, patient primary provider office offers chronic disease management embedded care management team.  According to encounter review, patient has been active in the Chronic Care Management team at PCP office.   Per review, current recommendation is for SNF. If patient transitions to SNF, will update community care management team.  Of note, [facility] Care Management services does not replace or interfere with any services that are arranged by inpatient case management or social work.   Netta Cedars, MSN, [place] [facility] Liaison Nurse Mobile Phone [phone]  Toll free office [phone]

## 2020-10-23 NOTE — Discharge Instructions (Signed)
Advised to take vitamin Q22, folic acid and vitamin D as prescribed.

## 2020-10-23 NOTE — Progress Notes (Signed)
This RN provided report to receiving nurse at [facility], [place]. All outstanding questions resolved.  Patient's R arm PIVs removed. Both cannulas intact. Pt tolerated well. All belongings packed and obtained by patient's daughter, [name]. Discharge information provided to [name]'s daughter. EMS to transport patient to facility via ambulance at time of departure.

## 2020-10-23 NOTE — Progress Notes (Signed)
Occupational Therapy Treatment Patient Details Name: Kayla Maxwell MRN: [id number] DOB: 25-Sep-1945 Today's Date: 10/23/2020   History of present illness 75 y.o with significant PMH H/o Severe PAD and PVD,  Bilateral lower extremity neuropathy, and DM who presented to the ED with chief complaints of dizziness, generalized weakness and near syncopal episode. Pt being tx'ed for acute respiratory failure with COPD and CHF exacerbation.   OT comments  Pt seen for OT tx this date to f/u re: safety with ADLs/ADL mobility. Pt noted with decreased knee buckling this session, but increased RR even with light activity (HR and O2 stays consistent throughout session around 90-93spO2 and HR 104-112bpm). Pt requires MIN A/CGA for ADL Transfers and MIN/MOD A for bed level LB dressing to don socks. OT educates re: cross-leg or figure 4 technique for LB Dressing to conserve energy. Pt with good understanding. Left in chair with chair alarm and family member present while eating lunch. Will continue to follow acutely. Continue to anticipate that pt will require f/u OT Services in STR setting.    Recommendations for follow up therapy are one component of a multi-disciplinary discharge planning process, led by the attending physician.  Recommendations may be updated based on patient status, additional functional criteria and insurance authorization.    Follow Up Recommendations  SNF    Equipment Recommendations  Other (comment) (defer)    Recommendations for Other Services      Precautions / Restrictions Precautions Precautions: Fall Restrictions Weight Bearing Restrictions: No       Mobility Bed Mobility Overal bed mobility: Needs Assistance Bed Mobility: Supine to Sit     Supine to sit: Supervision;HOB elevated     General bed mobility comments: increased time    Transfers Overall transfer level: Needs assistance Equipment used: Rolling walker (2 wheeled) Transfers: Sit to/from [employer] Sit to Stand: Min assist;Min guard Stand pivot transfers: Min assist       General transfer comment: increased time    Balance Overall balance assessment: Needs assistance Sitting-balance support: Feet supported Sitting balance-Leahy Scale: Fair     Standing balance support: Bilateral upper extremity supported;During functional activity Standing balance-Leahy Scale: Fair Standing balance comment: requires UE Support                           ADL either performed or assessed with clinical judgement   ADL Overall ADL's : Needs assistance/impaired                     Lower Body Dressing: Minimal assistance;Moderate assistance;Bed level Lower Body Dressing Details (indicate cue type and reason): using figure-4 to don socks in bed                     Vision Patient Visual Report: No change from baseline     Perception     Praxis      Cognition Arousal/Alertness: Awake/alert Behavior During Therapy: Restless Overall Cognitive Status: Difficult to assess                                 General Comments: pt hypophonic and out of breath making communication difficult, but [place] somewhat impulsive/lack of insight into deficits.        Exercises Other Exercises Other Exercises: OT engages pt in LB dressing tasks wiht ed re: task modification for energy conservation including figure  4 or cross leg technique   Shoulder Instructions       General Comments      Pertinent Vitals/ Pain       Pain Assessment: No/denies pain  Home Living                                          Prior Functioning/Environment              Frequency  Min 1X/week        Progress Toward Goals  OT Goals(current goals can now be found in the care plan section)  Progress towards OT goals: Progressing toward goals  Acute Rehab OT Goals Patient Stated Goal: none stated OT Goal Formulation: With patient Time  For Goal Achievement: 11/01/20 Potential to Achieve Goals: Good  Plan Discharge plan remains appropriate    Co-evaluation                 AM-PAC OT "6 Clicks" Daily Activity     Outcome Measure   Help from another person eating meals?: None Help from another person taking care of personal grooming?: A Little Help from another person toileting, which includes using toliet, bedpan, or urinal?: A Little Help from another person bathing (including washing, rinsing, drying)?: A Lot Help from another person to put on and taking off regular upper body clothing?: None Help from another person to put on and taking off regular lower body clothing?: A Lot 6 Click Score: 18    End of Session Equipment Utilized During Treatment: Gait belt  OT Visit Diagnosis: Unsteadiness on feet (R26.81);Muscle weakness (generalized) (M62.81);History of falling (Z91.81)   Activity Tolerance Patient tolerated treatment well   Patient Left in chair;with call bell/phone within reach;with chair alarm set;with family/visitor present   Nurse Communication Mobility status;Other (comment) (increased RR with light activity)        Time: 7591-6384 OT Time Calculation (min): 9 min  Charges: OT General Charges $OT Visit: 1 Visit OT Treatments $Therapeutic Activity: 8-22 mins  Gerrianne Scale, [place], OTR/L ascom [phone] 10/23/20, 2:29 PM

## 2020-10-23 NOTE — Telephone Encounter (Signed)
Form was signed and faxed

## 2020-10-23 NOTE — TOC Transition Note (Signed)
Transition of Care [facility]) - CM/SW Discharge Note   Patient Details  Name: Kayla Maxwell MRN: [id number] Date of Birth: 10-12-1945  [facility] CM/SW Contact:  Eileen Stanford, LCSW Phone Number: 10/23/2020, 2:40 PM   Clinical Narrative:   Clinical Social Worker facilitated patient discharge including contacting patient family and facility to confirm patient discharge plans.  Clinical information faxed to facility and family agreeable with plan.  CSW arranged ambulance transport via [facility] to [facility] (room 606A) .  RN to call [phone] for report prior to discharge.    Final next level of care: Skilled Nursing Facility Barriers to Discharge: No Barriers Identified   Patient Goals and CMS Choice Patient states their goals for this hospitalization and ongoing recovery are:: to go home CMS Medicare.gov Compare Post Acute Care list provided to:: Patient Represenative (must comment) (daughter [name]) Choice offered to / list presented to : Adult Children  Discharge Placement              Patient chooses bed at:  ([facility]) Patient to be transferred to facility by: [facility] Name of family member notified: daughter Patient and family notified of of transfer: 10/23/20  Discharge Plan and Services In-house Referral: NA   Post Acute Care Choice: [place]                               Social Determinants of Health (SDOH) Interventions     Readmission Risk Interventions No flowsheet data found.

## 2020-10-24 ENCOUNTER — Ambulatory Visit: Payer: Medicare Other | Admitting: Family

## 2020-10-24 DIAGNOSIS — R2689 Other abnormalities of gait and mobility: Secondary | ICD-10-CM | POA: Diagnosis not present

## 2020-10-24 DIAGNOSIS — I4891 Unspecified atrial fibrillation: Secondary | ICD-10-CM | POA: Diagnosis not present

## 2020-10-24 DIAGNOSIS — E539 Vitamin B deficiency, unspecified: Secondary | ICD-10-CM | POA: Diagnosis not present

## 2020-10-24 DIAGNOSIS — L54 Erythema in diseases classified elsewhere: Secondary | ICD-10-CM | POA: Diagnosis not present

## 2020-10-24 DIAGNOSIS — G9009 Other idiopathic peripheral autonomic neuropathy: Secondary | ICD-10-CM | POA: Diagnosis not present

## 2020-10-24 DIAGNOSIS — I5020 Unspecified systolic (congestive) heart failure: Secondary | ICD-10-CM | POA: Diagnosis not present

## 2020-10-24 DIAGNOSIS — I2510 Atherosclerotic heart disease of native coronary artery without angina pectoris: Secondary | ICD-10-CM | POA: Diagnosis not present

## 2020-10-24 DIAGNOSIS — R0603 Acute respiratory distress: Secondary | ICD-10-CM | POA: Diagnosis not present

## 2020-10-24 DIAGNOSIS — G9341 Metabolic encephalopathy: Secondary | ICD-10-CM | POA: Diagnosis not present

## 2020-10-28 ENCOUNTER — Inpatient Hospital Stay
Admission: EM | Admit: 2020-10-28 | Discharge: 2020-11-04 | DRG: 951 | Disposition: E | Payer: Medicare Other | Attending: Internal Medicine | Admitting: Internal Medicine

## 2020-10-28 ENCOUNTER — Emergency Department: Payer: Medicare Other

## 2020-10-28 DIAGNOSIS — J811 Chronic pulmonary edema: Secondary | ICD-10-CM | POA: Diagnosis present

## 2020-10-28 DIAGNOSIS — E785 Hyperlipidemia, unspecified: Secondary | ICD-10-CM | POA: Diagnosis present

## 2020-10-28 DIAGNOSIS — M6259 Muscle wasting and atrophy, not elsewhere classified, multiple sites: Secondary | ICD-10-CM | POA: Diagnosis not present

## 2020-10-28 DIAGNOSIS — L89311 Pressure ulcer of right buttock, stage 1: Secondary | ICD-10-CM | POA: Diagnosis present

## 2020-10-28 DIAGNOSIS — R627 Adult failure to thrive: Secondary | ICD-10-CM | POA: Diagnosis present

## 2020-10-28 DIAGNOSIS — R739 Hyperglycemia, unspecified: Secondary | ICD-10-CM | POA: Diagnosis present

## 2020-10-28 DIAGNOSIS — Z515 Encounter for palliative care: Secondary | ICD-10-CM | POA: Diagnosis not present

## 2020-10-28 DIAGNOSIS — I509 Heart failure, unspecified: Secondary | ICD-10-CM

## 2020-10-28 DIAGNOSIS — Z91040 Latex allergy status: Secondary | ICD-10-CM

## 2020-10-28 DIAGNOSIS — I5023 Acute on chronic systolic (congestive) heart failure: Secondary | ICD-10-CM | POA: Diagnosis present

## 2020-10-28 DIAGNOSIS — R58 Hemorrhage, not elsewhere classified: Secondary | ICD-10-CM | POA: Diagnosis present

## 2020-10-28 DIAGNOSIS — I110 Hypertensive heart disease with heart failure: Secondary | ICD-10-CM | POA: Diagnosis present

## 2020-10-28 DIAGNOSIS — Z8673 Personal history of transient ischemic attack (TIA), and cerebral infarction without residual deficits: Secondary | ICD-10-CM

## 2020-10-28 DIAGNOSIS — J90 Pleural effusion, not elsewhere classified: Secondary | ICD-10-CM | POA: Diagnosis not present

## 2020-10-28 DIAGNOSIS — J9622 Acute and chronic respiratory failure with hypercapnia: Secondary | ICD-10-CM | POA: Diagnosis present

## 2020-10-28 DIAGNOSIS — F419 Anxiety disorder, unspecified: Secondary | ICD-10-CM | POA: Diagnosis not present

## 2020-10-28 DIAGNOSIS — Z79899 Other long term (current) drug therapy: Secondary | ICD-10-CM

## 2020-10-28 DIAGNOSIS — L89151 Pressure ulcer of sacral region, stage 1: Secondary | ICD-10-CM | POA: Diagnosis present

## 2020-10-28 DIAGNOSIS — I4891 Unspecified atrial fibrillation: Secondary | ICD-10-CM | POA: Diagnosis present

## 2020-10-28 DIAGNOSIS — R001 Bradycardia, unspecified: Secondary | ICD-10-CM | POA: Diagnosis not present

## 2020-10-28 DIAGNOSIS — G8929 Other chronic pain: Secondary | ICD-10-CM | POA: Diagnosis present

## 2020-10-28 DIAGNOSIS — R4182 Altered mental status, unspecified: Secondary | ICD-10-CM | POA: Diagnosis not present

## 2020-10-28 DIAGNOSIS — G5793 Unspecified mononeuropathy of bilateral lower limbs: Secondary | ICD-10-CM | POA: Diagnosis present

## 2020-10-28 DIAGNOSIS — Z20822 Contact with and (suspected) exposure to covid-19: Secondary | ICD-10-CM | POA: Diagnosis present

## 2020-10-28 DIAGNOSIS — Z87891 Personal history of nicotine dependence: Secondary | ICD-10-CM

## 2020-10-28 DIAGNOSIS — K219 Gastro-esophageal reflux disease without esophagitis: Secondary | ICD-10-CM | POA: Diagnosis present

## 2020-10-28 DIAGNOSIS — M549 Dorsalgia, unspecified: Secondary | ICD-10-CM | POA: Diagnosis present

## 2020-10-28 DIAGNOSIS — R069 Unspecified abnormalities of breathing: Secondary | ICD-10-CM | POA: Diagnosis not present

## 2020-10-28 DIAGNOSIS — I10 Essential (primary) hypertension: Secondary | ICD-10-CM | POA: Diagnosis present

## 2020-10-28 DIAGNOSIS — I739 Peripheral vascular disease, unspecified: Secondary | ICD-10-CM | POA: Diagnosis present

## 2020-10-28 DIAGNOSIS — Z882 Allergy status to sulfonamides status: Secondary | ICD-10-CM

## 2020-10-28 DIAGNOSIS — Z6829 Body mass index (BMI) 29.0-29.9, adult: Secondary | ICD-10-CM

## 2020-10-28 DIAGNOSIS — I872 Venous insufficiency (chronic) (peripheral): Secondary | ICD-10-CM | POA: Diagnosis present

## 2020-10-28 DIAGNOSIS — R251 Tremor, unspecified: Secondary | ICD-10-CM | POA: Diagnosis not present

## 2020-10-28 DIAGNOSIS — I130 Hypertensive heart and chronic kidney disease with heart failure and stage 1 through stage 4 chronic kidney disease, or unspecified chronic kidney disease: Secondary | ICD-10-CM | POA: Diagnosis not present

## 2020-10-28 DIAGNOSIS — Z820 Family history of epilepsy and other diseases of the nervous system: Secondary | ICD-10-CM

## 2020-10-28 DIAGNOSIS — Z800 Family history of malignant neoplasm of digestive organs: Secondary | ICD-10-CM

## 2020-10-28 DIAGNOSIS — L8990 Pressure ulcer of unspecified site, unspecified stage: Secondary | ICD-10-CM | POA: Diagnosis present

## 2020-10-28 DIAGNOSIS — D72829 Elevated white blood cell count, unspecified: Secondary | ICD-10-CM | POA: Diagnosis present

## 2020-10-28 DIAGNOSIS — J9621 Acute and chronic respiratory failure with hypoxia: Secondary | ICD-10-CM | POA: Diagnosis present

## 2020-10-28 DIAGNOSIS — G9341 Metabolic encephalopathy: Secondary | ICD-10-CM | POA: Diagnosis present

## 2020-10-28 DIAGNOSIS — Z9981 Dependence on supplemental oxygen: Secondary | ICD-10-CM | POA: Diagnosis not present

## 2020-10-28 DIAGNOSIS — E119 Type 2 diabetes mellitus without complications: Secondary | ICD-10-CM | POA: Diagnosis not present

## 2020-10-28 DIAGNOSIS — Z7901 Long term (current) use of anticoagulants: Secondary | ICD-10-CM

## 2020-10-28 DIAGNOSIS — G9389 Other specified disorders of brain: Secondary | ICD-10-CM | POA: Diagnosis not present

## 2020-10-28 DIAGNOSIS — I639 Cerebral infarction, unspecified: Secondary | ICD-10-CM | POA: Diagnosis not present

## 2020-10-28 DIAGNOSIS — E539 Vitamin B deficiency, unspecified: Secondary | ICD-10-CM | POA: Diagnosis not present

## 2020-10-28 DIAGNOSIS — G4730 Sleep apnea, unspecified: Secondary | ICD-10-CM | POA: Diagnosis present

## 2020-10-28 DIAGNOSIS — R410 Disorientation, unspecified: Secondary | ICD-10-CM | POA: Diagnosis not present

## 2020-10-28 DIAGNOSIS — Z8541 Personal history of malignant neoplasm of cervix uteri: Secondary | ICD-10-CM

## 2020-10-28 DIAGNOSIS — G9009 Other idiopathic peripheral autonomic neuropathy: Secondary | ICD-10-CM | POA: Diagnosis not present

## 2020-10-28 DIAGNOSIS — R0902 Hypoxemia: Secondary | ICD-10-CM | POA: Diagnosis not present

## 2020-10-28 DIAGNOSIS — D649 Anemia, unspecified: Secondary | ICD-10-CM | POA: Diagnosis present

## 2020-10-28 DIAGNOSIS — F339 Major depressive disorder, recurrent, unspecified: Secondary | ICD-10-CM | POA: Diagnosis not present

## 2020-10-28 DIAGNOSIS — S52511A Displaced fracture of right radial styloid process, initial encounter for closed fracture: Secondary | ICD-10-CM | POA: Diagnosis not present

## 2020-10-28 DIAGNOSIS — I5021 Acute systolic (congestive) heart failure: Secondary | ICD-10-CM | POA: Diagnosis present

## 2020-10-28 DIAGNOSIS — L89321 Pressure ulcer of left buttock, stage 1: Secondary | ICD-10-CM | POA: Diagnosis present

## 2020-10-28 DIAGNOSIS — J441 Chronic obstructive pulmonary disease with (acute) exacerbation: Secondary | ICD-10-CM | POA: Diagnosis present

## 2020-10-28 DIAGNOSIS — R230 Cyanosis: Secondary | ICD-10-CM | POA: Diagnosis not present

## 2020-10-28 DIAGNOSIS — Z880 Allergy status to penicillin: Secondary | ICD-10-CM

## 2020-10-28 DIAGNOSIS — Z888 Allergy status to other drugs, medicaments and biological substances status: Secondary | ICD-10-CM

## 2020-10-28 DIAGNOSIS — J9602 Acute respiratory failure with hypercapnia: Secondary | ICD-10-CM

## 2020-10-28 DIAGNOSIS — J9601 Acute respiratory failure with hypoxia: Secondary | ICD-10-CM | POA: Diagnosis present

## 2020-10-28 DIAGNOSIS — Z885 Allergy status to narcotic agent status: Secondary | ICD-10-CM

## 2020-10-28 DIAGNOSIS — R0603 Acute respiratory distress: Secondary | ICD-10-CM | POA: Diagnosis not present

## 2020-10-28 DIAGNOSIS — Z66 Do not resuscitate: Secondary | ICD-10-CM | POA: Diagnosis present

## 2020-10-28 DIAGNOSIS — E669 Obesity, unspecified: Secondary | ICD-10-CM | POA: Diagnosis present

## 2020-10-28 DIAGNOSIS — Z741 Need for assistance with personal care: Secondary | ICD-10-CM | POA: Diagnosis not present

## 2020-10-28 DIAGNOSIS — J9610 Chronic respiratory failure, unspecified whether with hypoxia or hypercapnia: Secondary | ICD-10-CM | POA: Diagnosis not present

## 2020-10-28 DIAGNOSIS — G629 Polyneuropathy, unspecified: Secondary | ICD-10-CM | POA: Diagnosis present

## 2020-10-28 DIAGNOSIS — Z8249 Family history of ischemic heart disease and other diseases of the circulatory system: Secondary | ICD-10-CM

## 2020-10-28 DIAGNOSIS — J9600 Acute respiratory failure, unspecified whether with hypoxia or hypercapnia: Secondary | ICD-10-CM | POA: Diagnosis not present

## 2020-10-28 DIAGNOSIS — R609 Edema, unspecified: Secondary | ICD-10-CM | POA: Diagnosis present

## 2020-10-28 DIAGNOSIS — R2689 Other abnormalities of gait and mobility: Secondary | ICD-10-CM | POA: Diagnosis not present

## 2020-10-28 DIAGNOSIS — Z7902 Long term (current) use of antithrombotics/antiplatelets: Secondary | ICD-10-CM

## 2020-10-28 DIAGNOSIS — J050 Acute obstructive laryngitis [croup]: Secondary | ICD-10-CM | POA: Diagnosis not present

## 2020-10-28 DIAGNOSIS — I2510 Atherosclerotic heart disease of native coronary artery without angina pectoris: Secondary | ICD-10-CM | POA: Diagnosis not present

## 2020-10-28 DIAGNOSIS — Z789 Other specified health status: Secondary | ICD-10-CM | POA: Diagnosis present

## 2020-10-28 DIAGNOSIS — J9811 Atelectasis: Secondary | ICD-10-CM | POA: Diagnosis not present

## 2020-10-28 DIAGNOSIS — R2681 Unsteadiness on feet: Secondary | ICD-10-CM | POA: Diagnosis not present

## 2020-10-28 LAB — CBC WITH DIFFERENTIAL/PLATELET
Abs Immature Granulocytes: 0.05 10*3/uL (ref 0.00–0.07)
Basophils Absolute: 0 10*3/uL (ref 0.0–0.1)
Basophils Relative: 0 %
Eosinophils Absolute: 0 10*3/uL (ref 0.0–0.5)
Eosinophils Relative: 0 %
HCT: 34.1 % — ABNORMAL LOW (ref 36.0–46.0)
Hemoglobin: 9.1 g/dL — ABNORMAL LOW (ref 12.0–15.0)
Immature Granulocytes: 1 %
Lymphocytes Relative: 6 %
Lymphs Abs: 0.6 10*3/uL — ABNORMAL LOW (ref 0.7–4.0)
MCH: 24.2 pg — ABNORMAL LOW (ref 26.0–34.0)
MCHC: 26.7 g/dL — ABNORMAL LOW (ref 30.0–36.0)
MCV: 90.7 fL (ref 80.0–100.0)
Monocytes Absolute: 0.6 10*3/uL (ref 0.1–1.0)
Monocytes Relative: 5 %
Neutro Abs: 9.3 10*3/uL — ABNORMAL HIGH (ref 1.7–7.7)
Neutrophils Relative %: 88 %
Platelets: 177 10*3/uL (ref 150–400)
RBC: 3.76 MIL/uL — ABNORMAL LOW (ref 3.87–5.11)
RDW: 26.7 % — ABNORMAL HIGH (ref 11.5–15.5)
WBC: 10.6 10*3/uL — ABNORMAL HIGH (ref 4.0–10.5)
nRBC: 0.2 % (ref 0.0–0.2)

## 2020-10-28 LAB — COMPREHENSIVE METABOLIC PANEL
ALT: 29 U/L (ref 0–44)
AST: 25 U/L (ref 15–41)
Albumin: 3.5 g/dL (ref 3.5–5.0)
Alkaline Phosphatase: 69 U/L (ref 38–126)
Anion gap: 7 (ref 5–15)
BUN: 31 mg/dL — ABNORMAL HIGH (ref 8–23)
CO2: 36 mmol/L — ABNORMAL HIGH (ref 22–32)
Calcium: 8.6 mg/dL — ABNORMAL LOW (ref 8.9–10.3)
Chloride: 99 mmol/L (ref 98–111)
Creatinine, Ser: 0.89 mg/dL (ref 0.44–1.00)
GFR, Estimated: >60 mL/min (ref >60)
Glucose, Bld: 108 mg/dL — ABNORMAL HIGH (ref 70–99)
Potassium: 4.3 mmol/L (ref 3.5–5.1)
Sodium: 142 mmol/L (ref 135–145)
Total Bilirubin: 1.2 mg/dL (ref 0.3–1.2)
Total Protein: 6.2 g/dL — ABNORMAL LOW (ref 6.5–8.1)

## 2020-10-28 LAB — LACTIC ACID, PLASMA: Lactic Acid, Venous: 1 mmol/L (ref 0.5–1.9)

## 2020-10-28 LAB — RESP PANEL BY RT-PCR (FLU A&B, COVID) ARPGX2
Influenza A by PCR: NEGATIVE
Influenza B by PCR: NEGATIVE
SARS Coronavirus 2 by RT PCR: NEGATIVE

## 2020-10-28 LAB — AMMONIA: Ammonia: 18 umol/L (ref 9–35)

## 2020-10-28 LAB — PROTIME-INR
INR: 1.5 — ABNORMAL HIGH (ref 0.8–1.2)
Prothrombin Time: 17.7 seconds — ABNORMAL HIGH (ref 11.4–15.2)

## 2020-10-28 LAB — APTT: aPTT: 40 seconds — ABNORMAL HIGH (ref 24–36)

## 2020-10-28 LAB — PROCALCITONIN: Procalcitonin: <0.1 ng/mL

## 2020-10-28 LAB — BRAIN NATRIURETIC PEPTIDE: B Natriuretic Peptide: 805.2 pg/mL — ABNORMAL HIGH (ref 0.0–100.0)

## 2020-10-28 LAB — TROPONIN I (HIGH SENSITIVITY): Troponin I (High Sensitivity): 33 ng/L — ABNORMAL HIGH (ref <18)

## 2020-10-28 MED ORDER — ONDANSETRON HCL 4 MG/2ML IJ SOLN
4.0000 mg | Freq: Four times a day (QID) | INTRAMUSCULAR | Status: DC | PRN
  Administered 2020-10-28 – 2020-10-29 (×2): 4 mg via INTRAVENOUS
  Filled 2020-10-28 (×2): qty 2

## 2020-10-28 MED ORDER — GLYCOPYRROLATE 1 MG PO TABS
1.0000 mg | ORAL_TABLET | ORAL | Status: DC | PRN
  Filled 2020-10-28: qty 1

## 2020-10-28 MED ORDER — BIOTENE DRY MOUTH MT LIQD
15.0000 mL | OROMUCOSAL | Status: DC | PRN
  Filled 2020-10-28: qty 15

## 2020-10-28 MED ORDER — BISACODYL 10 MG RE SUPP
10.0000 mg | Freq: Every day | RECTAL | Status: DC | PRN
  Filled 2020-10-28: qty 1

## 2020-10-28 MED ORDER — HALOPERIDOL 0.5 MG PO TABS
0.5000 mg | ORAL_TABLET | ORAL | Status: DC | PRN
  Filled 2020-10-28: qty 1

## 2020-10-28 MED ORDER — ONDANSETRON 4 MG PO TBDP: 4.0000 mg | ORAL_TABLET | Freq: Four times a day (QID) | ORAL | Status: DC | PRN

## 2020-10-28 MED ORDER — POLYVINYL ALCOHOL 1.4 % OP SOLN
1.0000 [drp] | Freq: Four times a day (QID) | OPHTHALMIC | Status: DC | PRN
  Filled 2020-10-28: qty 15

## 2020-10-28 MED ORDER — TRAZODONE HCL 50 MG PO TABS: 25.0000 mg | ORAL_TABLET | Freq: Every evening | ORAL | Status: DC | PRN

## 2020-10-28 MED ORDER — DIPHENHYDRAMINE HCL 50 MG/ML IJ SOLN: 12.5000 mg | INTRAMUSCULAR | Status: DC | PRN

## 2020-10-28 MED ORDER — MORPHINE SULFATE (CONCENTRATE) 10 MG/0.5ML PO SOLN
5.0000 mg | ORAL | Status: DC | PRN
  Filled 2020-10-28: qty 0.5

## 2020-10-28 MED ORDER — METHYLPREDNISOLONE SODIUM SUCC 125 MG IJ SOLR
125.0000 mg | INTRAMUSCULAR | Status: AC
  Administered 2020-10-28: 125 mg via INTRAVENOUS
  Filled 2020-10-28: qty 2

## 2020-10-28 MED ORDER — ONDANSETRON HCL 4 MG/2ML IJ SOLN: 4.0000 mg | Freq: Four times a day (QID) | INTRAMUSCULAR | Status: DC | PRN

## 2020-10-28 MED ORDER — HALOPERIDOL LACTATE 2 MG/ML PO CONC
0.5000 mg | ORAL | Status: DC | PRN
  Filled 2020-10-28: qty 0.3

## 2020-10-28 MED ORDER — FUROSEMIDE 10 MG/ML IJ SOLN: 40.0000 mg | Freq: Once | INTRAMUSCULAR | Status: DC

## 2020-10-28 MED ORDER — ONDANSETRON 4 MG PO TBDP
4.0000 mg | ORAL_TABLET | Freq: Four times a day (QID) | ORAL | Status: DC | PRN
  Filled 2020-10-28: qty 1

## 2020-10-28 MED ORDER — MORPHINE SULFATE (PF) 2 MG/ML IV SOLN
1.0000 mg | INTRAVENOUS | Status: DC | PRN
  Administered 2020-10-28 – 2020-10-29 (×5): 1 mg via INTRAVENOUS
  Filled 2020-10-28 (×5): qty 1

## 2020-10-28 MED ORDER — IPRATROPIUM-ALBUTEROL 0.5-2.5 (3) MG/3ML IN SOLN
3.0000 mL | Freq: Once | RESPIRATORY_TRACT | Status: AC
  Administered 2020-10-28: 3 mL via RESPIRATORY_TRACT
  Filled 2020-10-28: qty 3

## 2020-10-28 MED ORDER — POLYVINYL ALCOHOL 1.4 % OP SOLN: 1.0000 [drp] | Freq: Four times a day (QID) | OPHTHALMIC | Status: DC | PRN

## 2020-10-28 MED ORDER — HALOPERIDOL LACTATE 5 MG/ML IJ SOLN
0.5000 mg | INTRAMUSCULAR | Status: DC | PRN
  Administered 2020-10-28 – 2020-10-29 (×4): 0.5 mg via INTRAVENOUS
  Filled 2020-10-28 (×5): qty 1

## 2020-10-28 MED ORDER — ACETAMINOPHEN 325 MG PO TABS: 650.0000 mg | ORAL_TABLET | Freq: Four times a day (QID) | ORAL | Status: DC | PRN

## 2020-10-28 MED ORDER — GLYCOPYRROLATE 0.2 MG/ML IJ SOLN
0.2000 mg | INTRAMUSCULAR | Status: DC | PRN
  Filled 2020-10-28: qty 1

## 2020-10-28 MED ORDER — GLYCOPYRROLATE 0.2 MG/ML IJ SOLN
0.2000 mg | INTRAMUSCULAR | Status: DC | PRN
  Administered 2020-10-28: 0.2 mg via INTRAVENOUS
  Filled 2020-10-28 (×3): qty 1

## 2020-10-28 MED ORDER — ACETAMINOPHEN 650 MG RE SUPP: 650.0000 mg | Freq: Four times a day (QID) | RECTAL | Status: DC | PRN

## 2020-10-28 MED ORDER — BIOTENE DRY MOUTH MT LIQD: 15.0000 mL | OROMUCOSAL | Status: DC | PRN

## 2020-10-28 NOTE — ED Notes (Signed)
Pt transitioned to [place] [facility] per MD order, taken off of bipap

## 2020-10-28 NOTE — ED Notes (Signed)
Introduced self to family, patient & family deny needs at this time. Currently eating ice cream.   Call light in reach, monitor change to comfort settings.

## 2020-10-28 NOTE — Progress Notes (Signed)
Order Requisition to provide support for end of life. Daughter was bedside and understandably emotional. I stayed present with her for a period and prayed for her. On call chaplain will follow up to provide additional support as needed.

## 2020-10-28 NOTE — ED Notes (Signed)
Pt resting quietly in bed with 2L via [place]. Family at bedside. No VS indicated at this time r/t comfort status.

## 2020-10-28 NOTE — H&P (Addendum)
History and Physical   Kayla Maxwell WNU:[id number] DOB: Nov 15, 1945 DOA: 10/23/2020  PCP: Gwyneth Sprout, FNP  Outpatient Specialists: Dr. Nehemiah Massed, [facility] Patient coming from: facility  I have personally briefly reviewed patient's old medical records in [place].  Chief Concern: altered mental status   HPI: Kayla Maxwell is a 75 y.o. female with medical history significant for peripheral artery disease status post bilateral stent placement, hypertension, bilateral neuropathy, heart failure reduced ejection fraction, hyperlipidemia, COPD, history of tobacco abuse, history of a stroke, TIA, atrial fibrillation, on anticoagulation, history of metabolic encephalopathy, I34 deficiency, iron deficiency anemia, vitamin D deficiency, history of sacral pressure ulcer, who presents emergency [facility] for chief concerns of altered mental status.  At bedside, patient was not able to tell me her name.  BiPAP mask was in place.  Patient was somnolent and obtunded.  Her blood pressure had a MAP of 58-62.  She minimally responded to pain and sternal rub.  She was not able to open her eyes, follow commands, or have purposeful movements.  Patient's CODE STATUS is DO NOT RESUSCITATE and DO NOT INTUBATE.  I confirmed this with daughter, Kayla Maxwell, at bedside who is healthcare power of attorney.  She reports that she has asked her husband to bring her CODE STATUS wishes.  I discussed with patient's regarding need of vasopressor to keep her MAP as this blood pressure is not compatible with life.   I discussed that with patient's low blood pressure, she would need vasopressor to keep her alive.  She cannot have fluids due to bilateral pulmonary congestion/edema.  Patient not tolerating BiPAP mask and given her declining SPO2 saturation and low blood pressure, patient would need vasopressor and or intubation.  Daughter at bedside declines this.  Daughter states that patient does  not want IV fluids, IV medications to be kept alive, intubation, or other heroic measures.  Social history: Prior to facility and becoming ill, patient lives at home by herself.  She had a longtime boyfriend of 25 years.  She is a former tobacco user and quit smoking in 2015.  Formerly she smoked 2 packs/day.  She is a former alcohol user and quit drinking about 10 years ago.  Daughter at bedside denies any history of IV drug use.  Vaccination history: Patient has received 3 COVID-19 vaccines from [employer].  ROS: Unable to complete  ED Course: Discussed with ED provider, patient requiring hospitalization for chief concerns of acute hypoxic respiratory failure.  Initial vitals in the emergency department showed a temperature of 98.1, respiration rate of 24, heart rate of 101 and increased to 105, blood pressure initially was 126/102, and declined to 98/49.  Patient was 75% on 5 L nasal cannula and required BiPAP.  Labs in the emergency department was remarkable for sodium 142, potassium 4.3, chloride 99, bicarb 36, BUN of 31, serum creatinine of 0.89, nonfasting blood glucose 108, GFR greater than 60, WBC 10.6, hemoglobin 9.8, platelets 177.  Lactic acid was 1.0.  VBG showed 7.29/92, PO2 is pending.  In the emergency department patient was given duo nebs 2 doses, Solu-Medrol 125 mg IV.  Assessment/Plan  Principal Problem:   End of life care Active Problems:   Edema   Atrial fibrillation by electrocardiography [facility])   PAD (peripheral artery disease) (HCC)   Benign essential HTN   Venous insufficiency of both lower extremities   Decreased activities of daily living (ADL)   Neuropathy of both feet   Acute  systolic CHF (congestive heart failure) (HCC)   Acute respiratory failure with hypoxia (HCC)   Pressure injury of skin   COPD with acute exacerbation (HCC)   History of tobacco use disorder   Ecchymosis   Leukocytosis   Failure to thrive in adult   Pulmonary edema   # End-of-life  admission - Admit to med-surg bed for comfort care, inpatient, no telemetry - Stop drawing labs and IVF - As needed Haldol for agitation - prn morphine for pain - As needed oxygen supplementation with nasal cannula - As needed Dulcolax for constipation - As needed ondansetron for nausea and vomiting - As needed diphenhydramine every 4 hours as needed for itching - Psycho/Social: emotional support offered to patient and family at bedside - Consult to palliterative care team - [facility] has been consulted for a [place] care referral - Chaplain/spiritual care has been consulted  # Acute metabolic encephalopathy # Acute on chronic hypoxic respiratory failure # Bilateral pulmonary edema-present on admission # COPD # Chronic O2 dependence # Multiple ecchymosis - POA  # History of tobacco use disorder # Atrial fibrillation # Peripheral artery disease-status post stent placement # Primary hypertension # Decrease activities of daily living # Failure to thrive # Leukocytosis # Obesity  Chart reviewed.   DVT prophylaxis: none Code Status: DNR/DNI Diet: NPO Family Communication: Extensive discussion with daughter at bedside Disposition Plan: Poor prognosis, anticipate in hospital death Consults called: Transitional care manager, palliative care, chaplain/spiritual care consultation Admission status: Palliative care, inpatient, no telemetry  Past Medical History:  Diagnosis Date   Acid reflux    takes Nexium daily   Allergy    takes Singulair daily as needed   Anemia    Atrial fibrillation ([place])    Cancer (HCC) AGE 17   CERVICAL cancer with removal   CHF (congestive heart failure) (HCC)    Chronic back pain    stenosis   Complication of anesthesia    Constipation    takes Colace daily as needed   Coronary artery disease    Difficult intubation    Dysrhythmia    atrial fibrillation dx 04/2014   Headache    sinus    History of blood transfusion    History of shingles     Hyperlipidemia    takes Pravastatin daily-started taking this 6 months ago   Hypertension    Lumbar surgical wound fluid collection    Sleep apnea    CPAP   Stroke ([place])    TIA'S X 2 --- one last yr and one this yr...lost her memory over 4-6 hrs.   Stroke [facility]) 06-04-15   left with right side weakness   TIA (transient ischemic attack)    showed up on an MRI but she never knew it   Urinary frequency    Past Surgical History:  Procedure Laterality Date   [place]  08/19/2014   Dr. Hal Neer ([place] otho)   [place]  2014   Dr Jamal Collin   COLONOSCOPY     CORONARY STENT INTERVENTION N/A 02/13/2020   Procedure: CORONARY STENT INTERVENTION;  Surgeon: Yolonda Kida, MD;  Location: [place] CV LAB;  Service: Cardiovascular;  Laterality: N/A;   ESOPHAGOGASTRODUODENOSCOPY     HAMMER TOE SURGERY Bilateral    LEFT HEART CATH AND CORONARY ANGIOGRAPHY N/A 02/13/2020   Procedure: LEFT HEART CATH  AND CORONARY ANGIOGRAPHY and possible PCI and stent;  Surgeon: Yolonda Kida, MD;  Location: [place] CV LAB;  Service: Cardiovascular;  Laterality: N/A;   LOWER EXTREMITY ANGIOGRAPHY Left 01/23/2019   Procedure: LOWER EXTREMITY ANGIOGRAPHY;  Surgeon: Katha Cabal, MD;  Location: [place] CV LAB;  Service: Cardiovascular;  Laterality: Left;   LOWER EXTREMITY ANGIOGRAPHY Left 02/20/2019   Procedure: LOWER EXTREMITY ANGIOGRAPHY;  Surgeon: Katha Cabal, MD;  Location: [place] CV LAB;  Service: Cardiovascular;  Laterality: Left;   LUMBAR LAMINECTOMY WITH COFLEX 1 LEVEL Bilateral 07/04/2014   Procedure: Laminectomy and Foraminotomy - bilateral - Lumbar Four-Five with coflex ;  Surgeon: Karie Chimera, MD;  Location: [place] [facility] ORS;  Service: Neurosurgery;  Laterality: Bilateral;   LUMBAR WOUND DEBRIDEMENT N/A 08/19/2014   Procedure: Exploration of Lumbar  wound;  Surgeon: Karie Chimera, MD;  Location: [place] NEURO ORS;  Service: Neurosurgery;  Laterality: N/A;   OPEN REDUCTION INTERNAL FIXATION (ORIF) DISTAL RADIAL FRACTURE Right 07/18/2020   Procedure: OPEN REDUCTION INTERNAL FIXATION (ORIF) DISTAL RADIAL FRACTURE;  Surgeon: Hessie Knows, MD;  Location: [facility] ORS;  Service: Orthopedics;  Laterality: Right;   PERIPHERAL VASCULAR CATHETERIZATION Left 08/12/2015   Procedure: Lower Extremity Angiography;  Surgeon: Katha Cabal, MD;  Location: [place] CV LAB;  Service: Cardiovascular;  Laterality: Left;   TONSILLECTOMY     VENTRAL HERNIA REPAIR N/A 05/26/2015   Procedure: HERNIA REPAIR VENTRAL ADULT;  Surgeon: Christene Lye, MD;  Location: [facility] ORS;  Service: General;  Laterality: N/A;   Social History:  reports that she quit smoking about 6 years ago. Her smoking use included cigarettes. She has a 47.00 pack-year smoking history. She quit smokeless tobacco use about 6 years ago. She reports that she does not drink alcohol and does not use drugs.  Allergies  Allergen Reactions   Codeine Other (See Comments)    Makes her feel "crazy"   Penicillins Swelling    Did it involve swelling of the face/tongue/throat, SOB, or low BP? Unknown Did it involve sudden or severe rash/hives, skin peeling, or any reaction on the inside of your mouth or nose? No Did you need to seek medical attention at a hospital or doctor's office? Unknown When did it last happen?      Childhood allergy If all above answers are "NO", may proceed with cephalosporin use.    Latex    Adhesive [Tape] Hives    (Latex)   Sulfa Antibiotics Nausea Only and Rash   Family History  Problem Relation Age of Onset   Alzheimer's disease Mother    Heart attack Father    Pancreatic cancer Sister    Healthy Brother    Family history: Family history reviewed and not pertinent  Prior to Admission medications   Medication Sig Start Date End Date Taking? Authorizing Provider   clopidogrel (PLAVIX) 75 MG tablet Take 75 mg by mouth daily.   Yes [provider]  famotidine (PEPCID) 20 MG tablet Take 1 tablet (20 mg total) by mouth 2 (two) times daily. 06/20/20  Yes Carrie Mew, MD  FLUoxetine (PROZAC) 20 MG capsule Take 1 capsule (20 mg total) by mouth daily. 07/18/20  Yes Hessie Knows, MD  folic acid (FOLVITE) 1 MG tablet Take 1 tablet (1 mg total) by mouth daily. 10/24/20  Yes Shawna Clamp, MD  furosemide (LASIX) 20 MG tablet Take 1 tablet (20 mg total) by mouth daily as needed for fluid or edema. 02/15/20  Yes Lorella Nimrod, MD  gabapentin (NEURONTIN) 300 MG capsule Take 2 capsules (600 mg total) by mouth at bedtime. 08/18/20  Yes Gwyneth Sprout, FNP  HYDROcodone-acetaminophen (NORCO) 5-325 MG tablet Take 1-2 tablets by mouth 2 (two) times daily as needed for moderate pain. Do not fill within 30 days of previous refill. Thank you. 10/08/20  Yes Tally Joe T, FNP  lisinopril (ZESTRIL) 2.5 MG tablet TAKE 1 TABLET BY MOUTH EVERY DAY 02/29/20  Yes Mar Daring, PA-C  metoprolol tartrate (LOPRESSOR) 25 MG tablet Take 0.5 tablets (12.5 mg total) by mouth 2 (two) times daily. 10/23/20  Yes Shawna Clamp, MD  oxymetazoline (AFRIN) 0.05 % nasal spray Place 1 spray into both nostrils 2 (two) times daily. 10/23/20  Yes Shawna Clamp, MD  pantoprazole (PROTONIX) 40 MG tablet Take 1 tablet (40 mg total) by mouth daily. 10/24/20  Yes Shawna Clamp, MD  rivaroxaban (XARELTO) 20 MG TABS tablet Take 20 mg by mouth daily with supper.  08/27/14  Yes [provider]  rosuvastatin (CRESTOR) 20 MG tablet Take 1 tablet (20 mg total) by mouth daily. 02/15/20  Yes Lorella Nimrod, MD  traZODone (DESYREL) 100 MG tablet Take 0.5 tablets (50 mg total) by mouth at bedtime. 10/13/20  Yes Gwyneth Sprout, FNP  vitamin B-12 (CYANOCOBALAMIN) 500 MCG tablet Take 1 tablet (500 mcg total) by mouth daily. 10/24/20  Yes Shawna Clamp, MD  Vitamin D, Ergocalciferol, (DRISDOL) 1.25  MG (50000 UNIT) CAPS capsule Take 1 capsule (50,000 Units total) by mouth every 7 (seven) days. 10/24/20  Yes Shawna Clamp, MD  albuterol (VENTOLIN HFA) 108 (90 Base) MCG/ACT inhaler Inhale 2 puffs into the lungs every 6 (six) hours as needed for wheezing or shortness of breath. 08/19/20   Gwyneth Sprout, FNP  nitroGLYCERIN (NITROSTAT) 0.4 MG SL tablet Place 1 tablet (0.4 mg total) under the tongue every 5 (five) minutes x 3 doses as needed for chest pain. 02/15/20   Lorella Nimrod, MD  ondansetron (ZOFRAN ODT) 4 MG disintegrating tablet Take 1 tablet (4 mg total) by mouth every 8 (eight) hours as needed for nausea or vomiting. 06/20/20   Carrie Mew, MD   Physical Exam: Vitals:   10/23/2020 1617 10/23/2020 1620 10/06/2020 1630 10/30/2020 1637  BP: (!) 235/211 (!) 237/226 (!) 191/158   Pulse:   87   Resp: 17 20 (!) 21 19  Temp:      TempSrc:      SpO2:   (!) 65% 98%   Constitutional: appears age-appropriate, frail, dying, NAD, calm, comfortable Eyes: Bilateral pinpoint pupils.  Not reactive to light or accommodation. ENMT: Mucous membranes are dry. Not able to assess dentition.  Unable to assess hearing Neck: normal, supple, no masses, no thyromegaly Respiratory: BiPAP in-place, + wheezing, no crackles.  Increased respiratory effort. Accessory muscle use.  Cardiovascular: Murmur is present.  Irregular heart rate and rhythm. Abdomen: Obese abdomen, no tenderness, no masses palpated, no hepatosplenomegaly.  Minimal bowel sounds.  Musculoskeletal: no clubbing / cyanosis. No joint deformity upper and lower extremities. Good ROM, no contractures, no atrophy. Normal muscle tone.  Skin: Multiple ecchymosis and wounds      Neurologic: Sensation not intact.  Unable to assess strength, patient not able to follow commands.  Patient is lethargic, obtunded. Psychiatric: Patient is not awake alert and oriented.  Unable to assess mood.  Lethargic, somnolent.  EKG: independently reviewed, showing atrial  fibrillation, with rate of 79, QTc 419.  Low amplitude.  Unchanged from EKG on 07/11/2020.  Chest x-ray  on Admission: I personally reviewed and I agree with radiologist reading as below.  CT HEAD WO CONTRAST (5MM)  Result Date: 10/31/2020 CLINICAL DATA:  Delirium. EXAM: CT HEAD WITHOUT CONTRAST TECHNIQUE: Contiguous axial images were obtained from the base of the skull through the vertex without intravenous contrast. COMPARISON:  CT head 10/15/2020 FINDINGS: Brain: No evidence of acute infarction, hemorrhage, hydrocephalus, extra-axial collection or mass lesion/mass effect. Unchanged encephalomalacia in the left parietal lobe. Chronic infarct in the left putamen. Area of low attenuation inferior left cerebellum is again noted, similar to most recent exam. Vascular: No hyperdense vessel or unexpected calcification. Skull: Normal. Negative for fracture or focal lesion. Sinuses/Orbits: No acute finding. Other: A 0.9 cm soft tissue density in the left external auditory canal likely represents cerumen. IMPRESSION: No acute intracranial abnormality. Electronically Signed   By: Ileana Roup M.D.   On: 10/31/2020 16:32   DG Chest Portable 1 View  Result Date: 11/01/2020 CLINICAL DATA:  Confusion EXAM: PORTABLE CHEST 1 VIEW COMPARISON:  10/21/2020 FINDINGS: Increased perihilar and basilar opacities. Bilateral pleural effusions remain present with bibasilar atelectasis. Similar cardiomediastinal contours. IMPRESSION: Increased pulmonary edema. Persistent pleural effusions with bibasilar atelectasis. Electronically Signed   By: Macy Mis M.D.   On: 10/04/2020 13:57    Labs on Admission: I have personally reviewed following labs CBC: Recent Labs  Lab 10/22/20 0609 10/23/20 0512 10/15/2020 1325  WBC 9.0 8.9 10.6*  NEUTROABS  --   --  9.3*  HGB 9.3* 9.3* 9.1*  HCT 34.0* 33.0* 34.1*  MCV 81.5 83.3 90.7  PLT 172 169 353   Basic Metabolic Panel: Recent Labs  Lab 10/22/20 0609 10/23/20 0512  10/11/2020 1325  NA 138 139 142  K 3.8 3.8 4.3  CL 93* 93* 99  CO2 41* 40* 36*  GLUCOSE 99 98 108*  BUN 31* 26* 31*  CREATININE 1.03* 0.78 0.89  CALCIUM 8.5* 8.7* 8.6*  MG 2.1 2.1  --   PHOS  --  3.7  --    GFR: Estimated Creatinine Clearance: 57.1 mL/min (by C-G formula based on SCr of 0.89 mg/dL). Liver Function Tests: Recent Labs  Lab 10/26/2020 1325  AST 25  ALT 29  ALKPHOS 69  BILITOT 1.2  PROT 6.2*  ALBUMIN 3.5   No results for input(s): LIPASE, AMYLASE in the last 168 hours. Recent Labs  Lab 10/20/2020 1324  AMMONIA 18   Coagulation Profile: Recent Labs  Lab 10/31/2020 1325  INR 1.5*   Urine analysis:    Component Value Date/Time   COLORURINE YELLOW (A) 10/15/2020 2050   APPEARANCEUR CLEAR (A) 10/15/2020 2050   APPEARANCEUR Turbid (A) 10/29/2014 0000   LABSPEC 1.016 10/15/2020 2050   LABSPEC 1.012 11/09/2013 1155   PHURINE 5.0 10/15/2020 2050   GLUCOSEU NEGATIVE 10/15/2020 2050   GLUCOSEU Negative 11/09/2013 1155   HGBUR NEGATIVE 10/15/2020 2050   BILIRUBINUR NEGATIVE 10/15/2020 2050   BILIRUBINUR neg 10/29/2014 1103   BILIRUBINUR Negative 10/29/2014 0000   BILIRUBINUR Negative 11/09/2013 [place] 10/15/2020 2050   PROTEINUR 30 (A) 10/15/2020 2050   UROBILINOGEN 0.2 10/29/2014 1103   NITRITE NEGATIVE 10/15/2020 2050   LEUKOCYTESUR NEGATIVE 10/15/2020 2050   LEUKOCYTESUR Negative 11/09/2013 1155   CRITICAL CARE Performed by: Briant Cedar Katheryn Culliton  Total critical care time: 40 minutes  Critical care time was exclusive of separately billable procedures and treating other patients.  Critical care was necessary to treat or prevent imminent or life-threatening deterioration.  Critical care was time  spent personally by me on the following activities: development of treatment plan with patient and/or surrogate as well as nursing, discussions with consultants, evaluation of patient's response to treatment, examination of patient, obtaining history from  patient or surrogate, ordering and performing treatments and interventions, ordering and review of laboratory studies, ordering and review of radiographic studies, pulse oximetry and re-evaluation of patient's condition.  Dr. Tobie Poet [facility]  If 7PM-7AM, please contact overnight-coverage provider If 7AM-7PM, please contact day coverage provider [email or web address]  10/31/2020, 4:56 PM

## 2020-10-28 NOTE — ED Triage Notes (Signed)
Pt coming from [facility] last known well time unknown. Per facility staff pt sats were in the 60s on 2l/[place]. Pt currently 98% on 5l/ns

## 2020-10-28 NOTE — Progress Notes (Signed)
Chaplain provide spiritual emotional support to family. Prayer for comfort and peace for patient

## 2020-10-28 NOTE — ED Provider Notes (Signed)
[facility] Emergency Department Provider Note ____________________________________________   Event Date/Time   First MD Initiated Contact with Patient 11/02/2020 1322     (approximate)  I have reviewed the triage vital signs and the nursing notes.  HISTORY  Chief Complaint Shortness of Breath  EM caveat: Confusion, altered mental status  HPI Kayla Maxwell is a 75 y.o. female EMS report there were called for confusion.  Last known well is unclear, but EMS was told that dayshift had noticed the patient was confused and was a carryover from notation from the night before  Patient herself is able to tell me her name spelled her name but cannot do the otherwise express orientation.  She seems somnolent but in no distress.  She denies pain denies shortness of breath denies any symptom but again the patient does appear to have some mild confusion  Past Medical History:  Diagnosis Date   Acid reflux    takes Nexium daily   Allergy    takes Singulair daily as needed   Anemia    Atrial fibrillation (HCC)    Cancer (HCC) AGE 75   CERVICAL cancer with removal   CHF (congestive heart failure) (HCC)    Chronic back pain    stenosis   Complication of anesthesia    Constipation    takes Colace daily as needed   Coronary artery disease    Difficult intubation    Dysrhythmia    atrial fibrillation dx 04/2014   Headache    sinus    History of blood transfusion    History of shingles    Hyperlipidemia    takes Pravastatin daily-started taking this 6 months ago   Hypertension    Lumbar surgical wound fluid collection    Sleep apnea    CPAP   Stroke ([place])    TIA'S X 2 --- one last yr and one this yr...lost her memory over 4-6 hrs.   Stroke [facility]) 06-04-15   left with right side weakness   TIA (transient ischemic attack)    showed up on an MRI but she never knew it   Urinary frequency     Patient Active Problem List   Diagnosis Date Noted   Epistaxis  10/22/2020   Pressure injury of skin 10/20/2020   Acute respiratory failure with hypoxia ([place]) 10/15/2020   Neuropathy of both feet 08/18/2020   Decreased activities of daily living (ADL) 49/67/5916   Acute systolic CHF (congestive heart failure) ([place]) 03/20/2020   Venous insufficiency of both lower extremities 11/02/2017   Benign essential HTN 11/01/2016   PAD (peripheral artery disease) ([place]) 01/14/2016   Edema 09/05/2014   Atrial fibrillation by electrocardiography ([place]) 04/29/2014    Past Surgical History:  Procedure Laterality Date   ABDOMINAL HYSTERECTOMY     APPENDECTOMY  1963   BACK SURGERY  08/19/2014   Dr. Hal Neer ([place] otho)   [place]  2014   Dr Jamal Collin   COLONOSCOPY     CORONARY STENT INTERVENTION N/A 02/13/2020   Procedure: CORONARY STENT INTERVENTION;  Surgeon: Yolonda Kida, MD;  Location: [place] [facility];  Service: Cardiovascular;  Laterality: N/A;   ESOPHAGOGASTRODUODENOSCOPY     HAMMER TOE SURGERY Bilateral    LEFT HEART CATH AND CORONARY ANGIOGRAPHY N/A 02/13/2020   Procedure: LEFT HEART CATH AND CORONARY ANGIOGRAPHY and possible PCI and stent;  Surgeon: Yolonda Kida, MD;  Location: [place] [facility];  Service: Cardiovascular;  Laterality: N/A;   LOWER EXTREMITY ANGIOGRAPHY Left 01/23/2019   Procedure: LOWER EXTREMITY ANGIOGRAPHY;  Surgeon: Katha Cabal, MD;  Location: [place] [facility];  Service: Cardiovascular;  Laterality: Left;   LOWER EXTREMITY ANGIOGRAPHY Left 02/20/2019   Procedure: LOWER EXTREMITY ANGIOGRAPHY;  Surgeon: Katha Cabal, MD;  Location: [place] [facility];  Service: Cardiovascular;  Laterality: Left;   LUMBAR LAMINECTOMY WITH COFLEX 1 LEVEL Bilateral 07/04/2014   Procedure: Laminectomy and Foraminotomy - bilateral - Lumbar Four-Five with coflex ;  Surgeon: Karie Chimera, MD;  Location: [place] [facility];  Service: Neurosurgery;  Laterality:  Bilateral;   LUMBAR WOUND DEBRIDEMENT N/A 08/19/2014   Procedure: Exploration of Lumbar wound;  Surgeon: Karie Chimera, MD;  Location: [place] [facility];  Service: Neurosurgery;  Laterality: N/A;   OPEN REDUCTION INTERNAL FIXATION (ORIF) DISTAL RADIAL FRACTURE Right 07/18/2020   Procedure: OPEN REDUCTION INTERNAL FIXATION (ORIF) DISTAL RADIAL FRACTURE;  Surgeon: Hessie Knows, MD;  Location: [facility] ORS;  Service: Orthopedics;  Laterality: Right;   PERIPHERAL VASCULAR CATHETERIZATION Left 08/12/2015   Procedure: Lower Extremity Angiography;  Surgeon: Katha Cabal, MD;  Location: [place] [facility];  Service: Cardiovascular;  Laterality: Left;   TONSILLECTOMY     VENTRAL HERNIA REPAIR N/A 05/26/2015   Procedure: HERNIA REPAIR VENTRAL ADULT;  Surgeon: Christene Lye, MD;  Location: [facility] ORS;  Service: General;  Laterality: N/A;    Prior to Admission medications   Medication Sig Start Date End Date Taking? Authorizing Provider  clopidogrel (PLAVIX) 75 MG tablet Take 75 mg by mouth daily.   Yes [provider]  famotidine (PEPCID) 20 MG tablet Take 1 tablet (20 mg total) by mouth 2 (two) times daily. 06/20/20  Yes Carrie Mew, MD  FLUoxetine (PROZAC) 20 MG capsule Take 1 capsule (20 mg total) by mouth daily. 07/18/20  Yes Hessie Knows, MD  folic acid (FOLVITE) 1 MG tablet Take 1 tablet (1 mg total) by mouth daily. 10/24/20  Yes Shawna Clamp, MD  furosemide (LASIX) 20 MG tablet Take 1 tablet (20 mg total) by mouth daily as needed for fluid or edema. 02/15/20  Yes Lorella Nimrod, MD  gabapentin (NEURONTIN) 300 MG capsule Take 2 capsules (600 mg total) by mouth at bedtime. 08/18/20  Yes Gwyneth Sprout, FNP  HYDROcodone-acetaminophen (NORCO) 5-325 MG tablet Take 1-2 tablets by mouth 2 (two) times daily as needed for moderate pain. Do not fill within 30 days of previous refill. Thank you. 10/08/20  Yes Tally Joe T, FNP  lisinopril (ZESTRIL) 2.5 MG tablet TAKE 1 TABLET BY MOUTH EVERY DAY  02/29/20  Yes Mar Daring, PA-C  metoprolol tartrate (LOPRESSOR) 25 MG tablet Take 0.5 tablets (12.5 mg total) by mouth 2 (two) times daily. 10/23/20  Yes Shawna Clamp, MD  oxymetazoline (AFRIN) 0.05 % nasal spray Place 1 spray into both nostrils 2 (two) times daily. 10/23/20  Yes Shawna Clamp, MD  pantoprazole (PROTONIX) 40 MG tablet Take 1 tablet (40 mg total) by mouth daily. 10/24/20  Yes Shawna Clamp, MD  rivaroxaban (XARELTO) 20 MG TABS tablet Take 20 mg by mouth daily with supper.  08/27/14  Yes [provider]  rosuvastatin (CRESTOR) 20 MG tablet Take 1 tablet (20 mg total) by mouth daily. 02/15/20  Yes Lorella Nimrod, MD  traZODone (DESYREL) 100 MG tablet Take 0.5 tablets (50 mg total) by mouth at bedtime. 10/13/20  Yes Gwyneth Sprout, FNP  vitamin B-12 (CYANOCOBALAMIN) 500  MCG tablet Take 1 tablet (500 mcg total) by mouth daily. 10/24/20  Yes Shawna Clamp, MD  Vitamin D, Ergocalciferol, (DRISDOL) 1.25 MG (50000 UNIT) CAPS capsule Take 1 capsule (50,000 Units total) by mouth every 7 (seven) days. 10/24/20  Yes Shawna Clamp, MD  albuterol (VENTOLIN HFA) 108 (90 Base) MCG/ACT inhaler Inhale 2 puffs into the lungs every 6 (six) hours as needed for wheezing or shortness of breath. 08/19/20   Gwyneth Sprout, FNP  nitroGLYCERIN (NITROSTAT) 0.4 MG SL tablet Place 1 tablet (0.4 mg total) under the tongue every 5 (five) minutes x 3 doses as needed for chest pain. 02/15/20   Lorella Nimrod, MD  ondansetron (ZOFRAN ODT) 4 MG disintegrating tablet Take 1 tablet (4 mg total) by mouth every 8 (eight) hours as needed for nausea or vomiting. 06/20/20   Carrie Mew, MD    Allergies Codeine, Penicillins, Latex, Adhesive [tape], and Sulfa antibiotics  Family History  Problem Relation Age of Onset   Alzheimer's disease Mother    Heart attack Father    Pancreatic cancer Sister    Healthy Brother     Social History Social History   Tobacco Use   Smoking status: Former     Packs/day: 1.00    Years: 47.00    Pack years: 47.00    Types: Cigarettes    Quit date: 08/19/2014    Years since quitting: 6.1   Smokeless tobacco: Former    Quit date: 08/19/2014  Vaping Use   Vaping Use: Never used  Substance Use Topics   Alcohol use: No   Drug use: No    Review of Systems  EM caveat   ____________________________________________   PHYSICAL EXAM:  VITAL SIGNS: ED Triage Vitals [10/27/2020 1321]  Enc Vitals Group     BP (!) 126/102     Pulse Rate (!) 101     Resp (!) 24     Temp 98.1 F (36.7 C)     Temp Source Oral     SpO2 98 %     Weight      Height      Head Circumference      Peak Flow      Pain Score      Pain Loc      Pain Edu?      Excl. in [place]?     Constitutional: Alert and oriented to self but cannot express clear orientation to place or year.  Somnolent and moderately ill but in no apparent distress eyes: Conjunctivae are normal. Head: Atraumatic. Nose: No congestion/rhinnorhea. Mouth/Throat: Mucous membranes are moist. Neck: No stridor.  Cardiovascular: Just slightly tachycardic rate, regular rhythm. Grossly normal heart sounds.  Good peripheral circulation. Respiratory: Slightly shallow respiratory effort no retractions. Lungs CTAB but very diminished throughout. Gastrointestinal: Soft and nontender. No distention. Musculoskeletal: No lower extremity tenderness nor edema.  She does have a slightly cyanotic appearance of the nailbeds and hands and feet bilaterally.  Capillary refill is slowed in all extremities Neurologic: When she does speak she can speak clearly but she is very weak and seems somnolent.  Moves extremities to touch and command but appears globally very fatigued.  Difficulty following more than simple commands Skin:  Skin is slightly cool, dry and intact. No rash noted. Psychiatric: Mood and affect are somnolent, not anxious  ____________________________________________   LABS (all labs ordered are listed, but  only abnormal results are displayed)  Labs Reviewed  COMPREHENSIVE METABOLIC PANEL - Abnormal; Notable for the following  components:      Result Value   CO2 36 (*)    Glucose, Bld 108 (*)    BUN 31 (*)    Calcium 8.6 (*)    Total Protein 6.2 (*)    All other components within normal limits  CBC WITH DIFFERENTIAL/PLATELET - Abnormal; Notable for the following components:   WBC 10.6 (*)    RBC 3.76 (*)    Hemoglobin 9.1 (*)    HCT 34.1 (*)    MCH 24.2 (*)    MCHC 26.7 (*)    RDW 26.7 (*)    Neutro Abs 9.3 (*)    Lymphs Abs 0.6 (*)    All other components within normal limits  PROTIME-INR - Abnormal; Notable for the following components:   Prothrombin Time 17.7 (*)    INR 1.5 (*)    All other components within normal limits  APTT - Abnormal; Notable for the following components:   aPTT 40 (*)    All other components within normal limits  BLOOD GAS, VENOUS - Abnormal; Notable for the following components:   pCO2, Ven 92 (*)    Bicarbonate 44.2 (*)    Acid-Base Excess 14.8 (*)    All other components within normal limits  BRAIN NATRIURETIC PEPTIDE - Abnormal; Notable for the following components:   B Natriuretic Peptide 805.2 (*)    All other components within normal limits  TROPONIN I (HIGH SENSITIVITY) - Abnormal; Notable for the following components:   Troponin I (High Sensitivity) 33 (*)    All other components within normal limits  CULTURE, BLOOD (SINGLE)  RESP PANEL BY RT-PCR (FLU A&B, COVID) ARPGX2  LACTIC ACID, PLASMA  AMMONIA  PROCALCITONIN  LACTIC ACID, PLASMA  URINALYSIS, COMPLETE (UACMP) WITH MICROSCOPIC  CBG MONITORING, ED   ____________________________________________  EKG  EKG reviewed inter by me at 1312 Heart rate 89 QRS 99 QTc 420 Atrial fibrillation, left anterior fascicular block.  No evidence of acute ischemia ____________________________________________  RADIOLOGY  DG Chest Portable 1 View  Result Date: 10/25/2020 CLINICAL DATA:   Confusion EXAM: PORTABLE CHEST 1 VIEW COMPARISON:  10/21/2020 FINDINGS: Increased perihilar and basilar opacities. Bilateral pleural effusions remain present with bibasilar atelectasis. Similar cardiomediastinal contours. IMPRESSION: Increased pulmonary edema. Persistent pleural effusions with bibasilar atelectasis. Electronically Signed   By: Macy Mis M.D.   On: 10/11/2020 13:57     Chest x-ray personally viewed by me, notable for findings concerning for acute pulmonary edema. ____________________________________________   PROCEDURES  Procedure(s) performed: None  Procedures  Critical Care performed: Yes, see critical care note(s)  CRITICAL CARE Performed by: Delman Kitten   Total critical care time: 35 minutes  Critical care time was exclusive of separately billable procedures and treating other patients.  Critical care was necessary to treat or prevent imminent or life-threatening deterioration.  Critical care was time spent personally by me on the following activities: development of treatment plan with patient and/or surrogate as well as nursing, discussions with consultants, evaluation of patient's response to treatment, examination of patient, obtaining history from patient or surrogate, ordering and performing treatments and interventions, ordering and review of laboratory studies, ordering and review of radiographic studies, pulse oximetry and re-evaluation of patient's condition.  ____________________________________________   INITIAL IMPRESSION / ASSESSMENT AND PLAN / ED COURSE  Pertinent labs & imaging results that were available during my care of the patient were reviewed by me and considered in my medical decision making (see chart for details).   Patient presents for  confusion, also noted per EMS the nursing home staff noted episode of hypoxia and increasing confusion.  Clinical assessment concerning for diminished ventilations, also elevated end-tidal CO2 with  EMS.  Constellation of symptoms seem to suggest possible hypercapnia patient does have multiple factors including CHF COPD recent hospitalization.  Does not appear in any pain or distress no obvious evidence of acute cardiac or central neurologic etiology rather the patient's exam seems very nonfocal slightly encephalopathic.    ----------------------------------------- 2:18 PM on 10/24/2020 ----------------------------------------- Patient is tolerating BiPAP well.  Taking minute volumes of about 10 to 14 L.  Have also ordered Lasix after reviewing the patient's chest x-ray she appears to be volume overloaded, and was being diuresed in the hospital recently.  I have spoken with her daughter who also confirmed the patient's DNR status.  It is my hope that with diuresis and continuation of BiPAP her mental status will improve and her PCO2 will decrease.  Thus far I do not see clear evidence of infectious nature  ----------------------------------------- 2:53 PM on 10/31/2020 ----------------------------------------- Patient is being diuresed, continues to use BiPAP well.  We will admit to hospitalist service for further care and management for concerns of underlying acute pulmonary etiology driving hypercapnic respiratory failure and also suspect pulmonary edema.  ----------------------------------------- 3:39 PM on 10/12/2020 -----------------------------------------   Vitals:   11/03/2020 1400 10/09/2020 1530  BP: (!) 95/42 (!) 85/47  Pulse: (!) 114 87  Resp: 19 17  Temp:    SpO2: (!) 89% 100%      Patient's vital signs are worsening.  Patient's orientation and somnolence seem to be worsening she is now more lethargic on BiPAP.  Discussed with the patient's daughter who IDENTIFIES as patient's healthcare power of attorney, and as she advises that the patient is definitely DO NOT RESUSCITATE would not wish to be on a ventilator and would not wish to have any life prolonging measures.  We  did notice that she has a small abrasion on her right leg as well as a small abrasion near her right lateral canthus, will have ordered CT of the head to evaluate for possible hemorrhage as a driving factor in her presentation, though I suspect this is most likely secondary to severe CHF exacerbation pulmonary edema and hypercapnia.    Daughter indicates that she does not wish for patient undergo any sort of heroic measures, and at this point would question even if treating her with anything via IV including fluids would be indicated with a desire to move towards patient comfort over further medical treatment.  Discussed case with hospitalist Dr. Tobie Poet who will be admitting.  At this point admission primarily for comfort measures without plan for any sort of heroic measures or treatment at this point.  Patient has a DNR his previously expressed these wishes in the past and her healthcare power of attorney (Daughter) as identified by her daughter at the bedside is agreeable ____________________________________________   FINAL CLINICAL IMPRESSION(S) / ED DIAGNOSES  Final diagnoses:  Acute hypercapnic respiratory failure ([place])  Acute congestive heart failure, unspecified heart failure type [facility])        Note:  This document was prepared using Dragon voice recognition software and may include unintentional dictation errors       Delman Kitten, MD 10/30/20 1617

## 2020-10-28 NOTE — ED Notes (Signed)
Pt awake and talking at this time, eating ice cream, requested pain medicine for pain 8/10. This RN administered ordered 1mg  of morphine at this time.

## 2020-10-28 NOTE — ED Notes (Signed)
Pt repositioned in bed with 2nd RN. Notably agitated.

## 2020-10-28 NOTE — ED Notes (Signed)
MD Cox notified of need for haldol injection d/t elevated BP and pt appearing agitated, fighting bipap, and attempting to take bipap off face.

## 2020-10-28 NOTE — ED Notes (Signed)
Confirmed with MD Cox. Pt taken off of VS monitoring equipment for comfort care per orders.

## 2020-10-29 ENCOUNTER — Telehealth: Payer: Self-pay

## 2020-10-29 ENCOUNTER — Encounter: Payer: Self-pay | Admitting: Internal Medicine

## 2020-10-29 ENCOUNTER — Other Ambulatory Visit: Payer: Self-pay

## 2020-10-29 DIAGNOSIS — Z515 Encounter for palliative care: Secondary | ICD-10-CM | POA: Diagnosis not present

## 2020-10-29 MED ORDER — HALOPERIDOL LACTATE 5 MG/ML IJ SOLN
1.0000 mg | Freq: Once | INTRAMUSCULAR | Status: AC
  Administered 2020-10-29: 1 mg via INTRAVENOUS

## 2020-10-29 MED ORDER — LORAZEPAM 2 MG/ML IJ SOLN
0.5000 mg | INTRAMUSCULAR | Status: DC | PRN
  Administered 2020-10-29: 0.5 mg via INTRAVENOUS
  Filled 2020-10-29: qty 1

## 2020-10-30 ENCOUNTER — Ambulatory Visit: Payer: Medicare Other | Admitting: Family

## 2020-11-02 LAB — CULTURE, BLOOD (SINGLE)
Culture: NO GROWTH
Special Requests: ADEQUATE

## 2020-11-04 NOTE — Telephone Encounter (Signed)
Copied from [place] [id number]. Topic: General - Deceased Patient >> 2020-11-20  3:27 PM [place] wrote: Reason for CRM: Daughter called in to say mother passed away this morning. November 20, 2020  Route to department's PEC Pool.

## 2020-11-04 NOTE — Plan of Care (Signed)
  Problem: Coping: Goal: Level of anxiety will decrease Outcome: Progressing Note: Patient responds to verbal and physical stimuli. Family is at bedside. Admission completed. Family is in agreement with plan of care.

## 2020-11-04 NOTE — Discharge Summary (Signed)
Death Summary  Kayla Maxwell WEX:[id number] DOB: [birth year] DOA: 11-21-2020  PCP: Gwyneth Sprout, FNP  Admit date: Nov 21, 2020 Date of Death: 11-22-2020 Time of Death: 11:27 am. Notification: Gwyneth Sprout, FNP notified of death of Nov 22, 2020   History of present illness:  Per HPI: Kayla Maxwell is a 75 y.o. female with a history of significant for peripheral artery disease status post bilateral stent placement, hypertension, bilateral neuropathy, heart failure reduced ejection fraction, hyperlipidemia, COPD, history of tobacco abuse, history of a stroke, TIA, atrial fibrillation, on anticoagulation, history of metabolic encephalopathy, L38 deficiency, iron deficiency anemia, vitamin D deficiency, history of sacral pressure ulcer, who [facility] for chief concerns of altered mental status.   At bedside, patient was not able to tell me her name.  BiPAP mask was in place.  Patient was somnolent and obtunded.  Her blood pressure had a MAP of 58-62.  She minimally responded to pain and sternal rub.  She was not able to open her eyes, follow commands, or have purposeful movements on admission.  Patient was DNR/DNI.  Patient did not tolerate BiPAP mask and given her declining SPO2 saturation and low blood pressure family did want any treatments such as vasopressor,  decided on comfort care measures .  Hospice was consulted.  Patient passed away this a.m.    Final Diagnoses:  End of life care admission Edema Atrial fibrillation by electrocardiography [facility]) PAD (peripheral artery disease) (HCC) Benign essential HTN Venous insufficiency of both lower extremities Decreased activities of daily living (ADL) Neuropathy of both feet Acute systolic CHF (congestive heart failure) (HCC) Acute respiratory failure with hypoxia (HCC) Pressure injury of skin COPD with acute exacerbation (HCC) History of tobacco use disorder Multiple  Ecchymosis Leukocytosis Failure to thrive in adult  Pulmonary edema Pleural effusion Acute metabolic encephlopathy Hyperlipidemia Sleep apnea TIA's Stroke Acid reflux Hyperglycemia Troponemia Anemia       The results of significant diagnostics from this hospitalization (including imaging, microbiology, ancillary and laboratory) are listed below for reference.    Significant Diagnostic Studies: CT HEAD WO CONTRAST (5MM)  Result Date: 2020-11-21 CLINICAL DATA:  Delirium. EXAM: CT HEAD WITHOUT CONTRAST TECHNIQUE: Contiguous axial images were obtained from the base of the skull through the vertex without intravenous contrast. COMPARISON:  CT head 10/15/2020 FINDINGS: Brain: No evidence of acute infarction, hemorrhage, hydrocephalus, extra-axial collection or mass lesion/mass effect. Unchanged encephalomalacia in the left parietal lobe. Chronic infarct in the left putamen. Area of low attenuation inferior left cerebellum is again noted, similar to most recent exam. Vascular: No hyperdense vessel or unexpected calcification. Skull: Normal. Negative for fracture or focal lesion. Sinuses/Orbits: No acute finding. Other: A 0.9 cm soft tissue density in the left external auditory canal likely represents cerumen. IMPRESSION: No acute intracranial abnormality. Electronically Signed   By: Ileana Roup M.D.   On: 11/21/2020 16:32   CT HEAD WO CONTRAST  Result Date: 10/15/2020 CLINICAL DATA:  Dizziness.  Nonspecific. EXAM: CT HEAD WITHOUT CONTRAST TECHNIQUE: Contiguous axial images were obtained from the base of the skull through the vertex without intravenous contrast. COMPARISON:  None. BRAIN: BRAIN Chronic left frontoparietal encephalomalacia again noted. No evidence of large-territorial acute infarction. No parenchymal hemorrhage. No mass lesion. No extra-axial collection. No mass effect or midline shift. No hydrocephalus. Basilar cisterns are patent. Vascular: No hyperdense vessel.  Atherosclerotic calcifications are present within the cavernous internal carotid arteries. Skull: No acute fracture or focal lesion. Sinuses/Orbits: Paranasal sinuses and mastoid air  cells are clear. Bilateral lens replacement. Orbits are unremarkable. Other: None. IMPRESSION: No acute intracranial abnormality. Electronically Signed   By: Iven Finn M.D.   On: 10/15/2020 19:41   CT CHEST WO CONTRAST  Result Date: 10/19/2020 CLINICAL DATA:  Respiratory failure EXAM: CT CHEST WITHOUT CONTRAST TECHNIQUE: Multidetector CT imaging of the chest was performed following the standard protocol without IV contrast. COMPARISON:  None. FINDINGS: Cardiovascular: Aortic atherosclerosis. Cardiomegaly. Three-vessel coronary artery calcifications and/or stents. Small pericardial effusion. Mediastinum/Nodes: No enlarged mediastinal, hilar, or axillary lymph nodes. Thyroid gland, trachea, and esophagus demonstrate no significant findings. Lungs/Pleura: Large right, moderate left pleural effusions and associated atelectasis or consolidation. Moderate centrilobular emphysema. Upper Abdomen: No acute abnormality. Musculoskeletal: No chest wall mass or suspicious bone lesions identified. Calcified bilateral prepectoral breast implants. IMPRESSION: 1. Large right, moderate left pleural effusions and associated atelectasis or consolidation. 2. Moderate centrilobular emphysema. 3. Cardiomegaly and coronary artery disease. Small pericardial effusion. Aortic Atherosclerosis (ICD10-I70.0) and Emphysema (ICD10-J43.9). Electronically Signed   By: Delanna Ahmadi M.D.   On: 10/19/2020 16:11   DG Hand 2 View Right  Result Date: 10/15/2020 CLINICAL DATA:  [place] today.  Swelling of the right hand. EXAM: RIGHT HAND - 2 VIEW COMPARISON:  Right forearm 07/11/2020 FINDINGS: Plate and screw fixation of an old fracture deformity of the distal right radial metaphysis. Fracture line remains present, most likely due to incomplete healing rather  than new fracture. The surgical hardware appears intact. There is an ununited ossicle at the ulnar styloid process which is also old. Diffuse bone demineralization. No additional fractures are identified. Soft tissue swelling over the dorsum of the right hand and wrist. No radiopaque soft tissue foreign bodies or soft tissue gas. Degenerative changes in the interphalangeal joints and STT joints. IMPRESSION: Previous internal fixation of an old fracture of the distal right radial metaphysis. Fracture line remains present likely indicating incomplete union. 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 Degenerative changes. Dorsal soft tissue swelling. Electronically Signed   By: Lucienne Capers M.D.   On: 10/15/2020 20:57   US Venous Img Lower Bilateral (DVT)  Result Date: 10/16/2020 CLINICAL DATA:  Cellulitis of right lower extremity L03.115 (ICD-10-CM) PVD (peripheral vascular disease) (HCC) I73.9 (ICD-10-CM) Redness and swelling of lower leg M79.89, R23.8 (ICD-10-CM) EXAM: BILATERAL LOWER EXTREMITY VENOUS DOPPLER ULTRASOUND TECHNIQUE: Gray-scale sonography with graded compression, as well as color Doppler and duplex ultrasound were performed to evaluate the lower extremity deep venous systems from the level of the common femoral vein and including the common femoral, femoral, profunda femoral, popliteal and calf veins including the posterior tibial, peroneal and gastrocnemius veins when visible. The superficial great saphenous vein was also interrogated. Spectral Doppler was utilized to evaluate flow at rest and with distal augmentation maneuvers in the common femoral, femoral and popliteal veins. COMPARISON:  None. FINDINGS: Limitations: Difficult study due to uncontrollable patient motion. RIGHT LOWER EXTREMITY Common Femoral Vein: No evidence of thrombus. Normal compressibility, respiratory phasicity and response to augmentation. Saphenofemoral Junction: No evidence of thrombus. Normal  compressibility and flow on color Doppler imaging. Profunda Femoral Vein: No evidence of thrombus. Normal compressibility and flow on color Doppler imaging. Femoral Vein: No evidence of thrombus. Normal compressibility, respiratory phasicity and response to augmentation. Popliteal Vein: No evidence of thrombus. Normal compressibility, respiratory phasicity and response to augmentation. Calf Veins: No evidence of thrombus. Normal compressibility and flow on color Doppler imaging. Superficial Great Saphenous Vein: No evidence of thrombus. Normal compressibility. LEFT LOWER EXTREMITY Common Femoral  Vein: No evidence of thrombus. Normal compressibility, respiratory phasicity and response to augmentation. Saphenofemoral Junction: No evidence of thrombus. Normal compressibility and flow on color Doppler imaging. Profunda Femoral Vein: No evidence of thrombus. Normal compressibility and flow on color Doppler imaging. Femoral Vein: No evidence of thrombus. Normal compressibility, respiratory phasicity and response to augmentation. Popliteal Vein: No evidence of thrombus. Normal compressibility, respiratory phasicity and response to augmentation. Calf Veins: No evidence of thrombus. Normal compressibility and flow on color Doppler imaging. Superficial Great Saphenous Vein: No evidence of thrombus. Normal compressibility. IMPRESSION: No evidence of deep venous thrombosis in either lower extremity. Electronically Signed   By: Margaretha Sheffield M.D.   On: 10/16/2020 10:38   US ARTERIAL ABI (SCREENING LOWER EXTREMITY)  Result Date: 10/16/2020 CLINICAL DATA:  Peripheral arterial disease. History of bilateral common iliac artery stents. EXAM: NONINVASIVE PHYSIOLOGIC VASCULAR STUDY OF BILATERAL LOWER EXTREMITIES TECHNIQUE: Evaluation of both lower extremities were performed at rest, including calculation of ankle-brachial indices with single level Doppler, pressure and pulse volume recording. COMPARISON:  CT 06/20/2020  FINDINGS: Right ABI:  1.21 Left ABI:  1.09 Right Lower Extremity: Irregularity with some triphasic Doppler waveforms. Left Lower Extremity: Irregularity with some triphasic Doppler waveforms. 1.0-1.4 Normal IMPRESSION: Normal resting ankle-brachial indices. Electronically Signed   By: Markus Daft M.D.   On: 10/16/2020 16:49   DG Chest Portable 1 View  Result Date: 10/30/2020 CLINICAL DATA:  Confusion EXAM: PORTABLE CHEST 1 VIEW COMPARISON:  10/21/2020 FINDINGS: Increased perihilar and basilar opacities. Bilateral pleural effusions remain present with bibasilar atelectasis. Similar cardiomediastinal contours. IMPRESSION: Increased pulmonary edema. Persistent pleural effusions with bibasilar atelectasis. Electronically Signed   By: Macy Mis M.D.   On: 10/13/2020 13:57   DG Chest Port 1 View  Result Date: 10/21/2020 CLINICAL DATA:  Pleural effusion. EXAM: PORTABLE CHEST 1 VIEW COMPARISON:  One-view chest x-ray 10/19/2020 FINDINGS: Heart is enlarged. Atherosclerotic calcifications are present. Interstitial edema is slightly improved. Bilateral effusions and basilar airspace disease remain, right greater than left. Effusions are slightly decreased as well. IMPRESSION: 1. Slight decrease in interstitial edema and bilateral effusions. 2. Persistent bibasilar airspace disease, right greater than left. Electronically Signed   By: San Morelle M.D.   On: 10/21/2020 12:17   DG Chest Port 1 View  Result Date: 10/19/2020 CLINICAL DATA:  Shortness of breath, productive cough. EXAM: PORTABLE CHEST 1 VIEW COMPARISON:  Chest x-rays dated 10/16/2020 and 10/15/2020. FINDINGS: Increasing bilateral perihilar and bibasilar airspace opacities. Stable cardiomegaly. No pneumothorax is seen. Suspect small bilateral pleural effusions. IMPRESSION: Increasing bilateral perihilar and bibasilar airspace opacities, compatible with multifocal pneumonia versus pulmonary edema. Probable small bilateral pleural effusions.  Electronically Signed   By: Franki Cabot M.D.   On: 10/19/2020 09:23   DG Chest Port 1 View  Result Date: 10/16/2020 CLINICAL DATA:  CHF EXAM: PORTABLE CHEST 1 VIEW COMPARISON:  10/15/2020 FINDINGS: Cardiomegaly and vascular pedicle widening with hazy density at the bases from pleural fluid and atelectasis at least. Congestive appearance of hilar vessels. Diffuse atheromatous calcification of the aorta. No pneumothorax. IMPRESSION: Continued congestion/edema with pleural effusions and presumed atelectasis at the bases. Electronically Signed   By: Jorje Guild M.D.   On: 10/16/2020 04:39   DG Chest Port 1 View  Result Date: 10/15/2020 CLINICAL DATA:  Fall, dizziness EXAM: PORTABLE CHEST 1 VIEW COMPARISON:  06/20/2020 FINDINGS: Lung volumes are small and pulmonary insufflation has diminished when compared to prior examination. Interval development of mild bilateral perihilar interstitial pulmonary infiltrate, most  in keeping with interstitial pulmonary edema, as well as small bilateral pleural effusions, right greater than left. Cardiomegaly is stable. No pneumothorax. No acute bone abnormality. Bilateral calcified breast implants noted. IMPRESSION: Interval development of mild to moderate CHF.  Stable cardiomegaly Pulmonary hypoinflation Electronically Signed   By: Fidela Salisbury M.D.   On: 10/15/2020 20:45   ECHOCARDIOGRAM COMPLETE  Result Date: 10/16/2020    ECHOCARDIOGRAM REPORT   Patient Name:   IVONNA KINNICK Lengyel Date of Exam: 10/16/2020 Medical Rec #:  [id number]      Height:       65.0 in Accession #:    [id number]     Weight:       160.0 lb Date of Birth:  25-Aug-1945      BSA:          1.799 m Patient Age:    89 years       BP:           103/73 mmHg Patient Gender: F              HR:           94 bpm. Exam Location:  [facility] Procedure: 2D Echo, Color Doppler and Cardiac Doppler Indications:     I50.21 congestive heart failure-Acute Systolic  History:         Patient has prior history of  Echocardiogram examinations, most                  recent 02/12/2020. CHF, CAD, Signs/Symptoms:Shortness of Breath                  and Edema; Risk Factors:Hypertension, Dyslipidemia and Sleep                  Apnea.  Sonographer:     Charmayne Sheer Referring Phys:  [place] Diagnosing Phys: Isaias Cowman MD IMPRESSIONS  1. Left ventricular ejection fraction, by estimation, is 45 to 50%. The left ventricle has mildly decreased function. The left ventricle has no regional wall motion abnormalities. Left ventricular diastolic parameters are indeterminate.  2. Right ventricular systolic function is normal. The right ventricular size is normal.  3. A small pericardial effusion is present.  4. The mitral valve is normal in structure. Mild mitral valve regurgitation. No evidence of mitral stenosis.  5. The aortic valve is normal in structure. Aortic valve regurgitation is not visualized. No aortic stenosis is present.  6. The inferior vena cava is normal in size with greater than 50% respiratory variability, suggesting right atrial pressure of 3 mmHg. FINDINGS  Left Ventricle: Left ventricular ejection fraction, by estimation, is 45 to 50%. The left ventricle has mildly decreased function. The left ventricle has no regional wall motion abnormalities. The left ventricular internal cavity size was normal in size. There is no left ventricular hypertrophy. Left ventricular diastolic parameters are indeterminate. Right Ventricle: The right ventricular size is normal. No increase in right ventricular wall thickness. Right ventricular systolic function is normal. Left Atrium: Left atrial size was normal in size. Right Atrium: Right atrial size was normal in size. Pericardium: A small pericardial effusion is present. Mitral Valve: The mitral valve is normal in structure. Mild mitral valve regurgitation. No evidence of mitral valve stenosis. MV peak gradient, 4.8 mmHg. The mean mitral valve gradient is 2.0  mmHg. Tricuspid Valve: The tricuspid valve is normal in structure. Tricuspid valve regurgitation is mild . No evidence of tricuspid stenosis. Aortic Valve: The aortic valve  is normal in structure. Aortic valve regurgitation is not visualized. No aortic stenosis is present. Aortic valve mean gradient measures 4.0 mmHg. Aortic valve peak gradient measures 6.5 mmHg. Aortic valve area, by VTI measures 1.89 cm. Pulmonic Valve: The pulmonic valve was normal in structure. Pulmonic valve regurgitation is not visualized. No evidence of pulmonic stenosis. Aorta: The aortic root is normal in size and structure. Venous: The inferior vena cava is normal in size with greater than 50% respiratory variability, suggesting right atrial pressure of 3 mmHg. IAS/Shunts: No atrial level shunt detected by color flow Doppler.  LEFT VENTRICLE PLAX 2D LVIDd:         4.50 cm   Diastology LVIDs:         2.98 cm   LV e' medial:    7.40 cm/s LV PW:         1.24 cm   LV E/e' medial:  14.3 LV IVS:        0.75 cm   LV e' lateral:   6.74 cm/s LVOT diam:     1.90 cm   LV E/e' lateral: 15.7 LV SV:         44 LV SV Index:   24 LVOT Area:     2.84 cm  RIGHT VENTRICLE RV Basal diam:  3.59 cm LEFT ATRIUM           Index        RIGHT ATRIUM           Index LA diam:      5.90 cm 3.28 cm/m   RA Area:     22.00 cm LA Vol (A2C): 41.5 ml 23.07 ml/m  RA Volume:   58.70 ml  32.63 ml/m LA Vol (A4C): 84.1 ml 46.74 ml/m  AORTIC VALVE                    PULMONIC VALVE AV Area (Vmax):    1.90 cm     PV Vmax:       0.66 m/s AV Area (Vmean):   1.88 cm     PV Vmean:      44.600 cm/s AV Area (VTI):     1.89 cm     PV VTI:        0.102 m AV Vmax:           127.00 cm/s  PV Peak grad:  1.7 mmHg AV Vmean:          88.200 cm/s  PV Mean grad:  1.0 mmHg AV VTI:            0.231 m AV Peak Grad:      6.5 mmHg AV Mean Grad:      4.0 mmHg LVOT Vmax:         84.90 cm/s LVOT Vmean:        58.500 cm/s LVOT VTI:          0.154 m LVOT/AV VTI ratio: 0.67  AORTA Ao Root diam:  2.80 cm MITRAL VALVE                TRICUSPID VALVE MV Area (PHT): 5.22 cm     TR Peak grad:   29.8 mmHg MV Area VTI:   1.51 cm     TR Vmax:        273.00 cm/s MV Peak grad:  4.8 mmHg MV Mean grad:  2.0 mmHg     SHUNTS MV Vmax:  1.09 m/s     Systemic VTI:  0.15 m MV Vmean:      53.9 cm/s    Systemic Diam: 1.90 cm MV Decel Time: 145 msec MV E velocity: 105.67 cm/s Isaias Cowman MD Electronically signed by Isaias Cowman MD Signature Date/Time: 10/16/2020/1:32:50 PM    Final     Microbiology: Recent Results (from the past 240 hour(s))  Resp Panel by RT-PCR (Flu A&B, Covid) Nasopharyngeal Swab     Status: None   Collection Time: 10/23/20  2:15 PM   Specimen: Nasopharyngeal Swab; Nasopharyngeal(NP) swabs in vial transport medium  Result Value Ref Range Status   SARS Coronavirus 2 by RT PCR NEGATIVE NEGATIVE Final    Comment: (NOTE) SARS-CoV-2 target nucleic acids are NOT DETECTED.  The SARS-CoV-2 RNA is generally detectable in upper respiratory specimens during the acute phase of infection. The lowest concentration of SARS-CoV-2 viral copies this assay can detect is 138 copies/mL. A negative result does not preclude SARS-Cov-2 infection and should not be used as the sole basis for treatment or other patient management decisions. A negative result may occur with  improper specimen collection/handling, submission of specimen other than nasopharyngeal swab, presence of viral mutation(s) within the areas targeted by this assay, and inadequate number of viral copies(<138 copies/mL). A negative result must be combined with clinical observations, patient history, and epidemiological information. The expected result is Negative.  Fact Sheet for Patients:  EntrepreneurPulse.com.au  Fact Sheet for Healthcare Providers:  IncredibleEmployment.be  This test is no t yet approved or cleared by the [place] FDA and  has been authorized for  detection and/or diagnosis of SARS-CoV-2 by FDA under an Emergency Use Authorization (EUA). This EUA will remain  in effect (meaning this test can be used) for the duration of the COVID-19 declaration under Section 564(b)(1) of the Act, 21 U.S.C.section 360bbb-3(b)(1), unless the authorization is terminated  or revoked sooner.       Influenza A by PCR NEGATIVE NEGATIVE Final   Influenza B by PCR NEGATIVE NEGATIVE Final    Comment: (NOTE) The Xpert Xpress SARS-CoV-2/FLU/RSV plus assay is intended as an aid in the diagnosis of influenza from Nasopharyngeal swab specimens and should not be used as a sole basis for treatment. Nasal washings and aspirates are unacceptable for Xpert Xpress SARS-CoV-2/FLU/RSV testing.  Fact Sheet for Patients: EntrepreneurPulse.com.au  Fact Sheet for Healthcare Providers: IncredibleEmployment.be  This test is not yet approved or cleared by the [place] FDA and has been authorized for detection and/or diagnosis of SARS-CoV-2 by FDA under an Emergency Use Authorization (EUA). This EUA will remain in effect (meaning this test can be used) for the duration of the COVID-19 declaration under Section 564(b)(1) of the Act, 21 U.S.C. section 360bbb-3(b)(1), unless the authorization is terminated or revoked.  Performed at [facility] [place], [address] [place], [place] [address]   Blood culture (routine single)     Status: None (Preliminary result)   Collection Time: 10/31/2020  1:25 PM   Specimen: BLOOD  Result Value Ref Range Status   Specimen Description BLOOD RIGHT LOWER ARM  Final   Special Requests   Final    BOTTLES DRAWN AEROBIC AND ANAEROBIC Blood Culture adequate volume   Culture   Final    NO GROWTH < 24 HOURS Performed at [facility], [address] [place], [place] [address]    Report Status PENDING  Incomplete  Resp Panel by RT-PCR (Flu A&B, Covid) Nasopharyngeal Swab     Status:  None   Collection Time: 10/24/2020  4:16 PM   Specimen: Nasopharyngeal Swab; Nasopharyngeal(NP) swabs in vial transport medium  Result Value Ref Range Status   SARS Coronavirus 2 by RT PCR NEGATIVE NEGATIVE Final    Comment: (NOTE) SARS-CoV-2 target nucleic acids are NOT DETECTED.  The SARS-CoV-2 RNA is generally detectable in upper respiratory specimens during the acute phase of infection. The lowest concentration of SARS-CoV-2 viral copies this assay can detect is 138 copies/mL. A negative result does not preclude SARS-Cov-2 infection and should not be used as the sole basis for treatment or other patient management decisions. A negative result may occur with  improper specimen collection/handling, submission of specimen other than nasopharyngeal swab, presence of viral mutation(s) within the areas targeted by this assay, and inadequate number of viral copies(<138 copies/mL). A negative result must be combined with clinical observations, patient history, and epidemiological information. The expected result is Negative.  Fact Sheet for Patients:  EntrepreneurPulse.com.au  Fact Sheet for Healthcare Providers:  IncredibleEmployment.be  This test is no t yet approved or cleared by the [place] FDA and  has been authorized for detection and/or diagnosis of SARS-CoV-2 by FDA under an Emergency Use Authorization (EUA). This EUA will remain  in effect (meaning this test can be used) for the duration of the COVID-19 declaration under Section 564(b)(1) of the Act, 21 U.S.C.section 360bbb-3(b)(1), unless the authorization is terminated  or revoked sooner.       Influenza A by PCR NEGATIVE NEGATIVE Final   Influenza B by PCR NEGATIVE NEGATIVE Final    Comment: (NOTE) The Xpert Xpress SARS-CoV-2/FLU/RSV plus assay is intended as an aid in the diagnosis of influenza from Nasopharyngeal swab specimens and should not be used as a sole basis for  treatment. Nasal washings and aspirates are unacceptable for Xpert Xpress SARS-CoV-2/FLU/RSV testing.  Fact Sheet for Patients: EntrepreneurPulse.com.au  Fact Sheet for Healthcare Providers: IncredibleEmployment.be  This test is not yet approved or cleared by the [place] FDA and has been authorized for detection and/or diagnosis of SARS-CoV-2 by FDA under an Emergency Use Authorization (EUA). This EUA will remain in effect (meaning this test can be used) for the duration of the COVID-19 declaration under Section 564(b)(1) of the Act, 21 U.S.C. section 360bbb-3(b)(1), unless the authorization is terminated or revoked.  Performed at [facility] [address] [place], [place] [address]      Labs: Basic Metabolic Panel: Recent Labs  Lab 10/23/20 0512 10/07/2020 1325  NA 139 142  K 3.8 4.3  CL 93* 99  CO2 40* 36*  GLUCOSE 98 108*  BUN 26* 31*  CREATININE 0.78 0.89  CALCIUM 8.7* 8.6*  MG 2.1  --   PHOS 3.7  --    Liver Function Tests: Recent Labs  Lab 10/30/2020 1325  AST 25  ALT 29  ALKPHOS 69  BILITOT 1.2  PROT 6.2*  ALBUMIN 3.5   No results for input(s): LIPASE, AMYLASE in the last 168 hours. Recent Labs  Lab 11/01/2020 1324  AMMONIA 18   CBC: Recent Labs  Lab 10/23/20 0512 10/07/2020 1325  WBC 8.9 10.6*  NEUTROABS  --  9.3*  HGB 9.3* 9.1*  HCT 33.0* 34.1*  MCV 83.3 90.7  PLT 169 177   Cardiac Enzymes: No results for input(s): CKTOTAL, CKMB, CKMBINDEX, TROPONINI in the last 168 hours. D-Dimer No results for input(s): DDIMER in the last 72 hours. BNP: Invalid input(s): POCBNP CBG: No results for input(s): GLUCAP in the last 168  hours. Anemia work up No results for input(s): VITAMINB12, FOLATE, FERRITIN, TIBC, IRON, RETICCTPCT in the last 72 hours. Urinalysis    Component Value Date/Time   COLORURINE YELLOW (A) 10/15/2020 2050   APPEARANCEUR CLEAR (A) 10/15/2020 2050   APPEARANCEUR Turbid (A)  10/29/2014 0000   LABSPEC 1.016 10/15/2020 2050   LABSPEC 1.012 11/09/2013 1155   PHURINE 5.0 10/15/2020 2050   GLUCOSEU NEGATIVE 10/15/2020 2050   GLUCOSEU Negative 11/09/2013 1155   HGBUR NEGATIVE 10/15/2020 2050   BILIRUBINUR NEGATIVE 10/15/2020 2050   BILIRUBINUR neg 10/29/2014 1103   BILIRUBINUR Negative 10/29/2014 0000   BILIRUBINUR Negative 11/09/2013 [place] 10/15/2020 2050   PROTEINUR 30 (A) 10/15/2020 2050   UROBILINOGEN 0.2 10/29/2014 1103   NITRITE NEGATIVE 10/15/2020 2050   LEUKOCYTESUR NEGATIVE 10/15/2020 2050   LEUKOCYTESUR Negative 11/09/2013 1155   Sepsis Labs Invalid input(s): PROCALCITONIN,  WBC,  LACTICIDVEN     SIGNED:  Nolberto Hanlon, MD  [facility] 11/24/2020, 11:37 AM Pager   If 7PM-7AM, please contact night-coverage [email or web address] Password [email or web address]

## 2020-11-04 NOTE — Progress Notes (Signed)
PROGRESS NOTE    Kayla Maxwell  PFX:[id number] DOB: 01-17-1945 DOA: 10/20/2020 PCP: Gwyneth Sprout, FNP    Brief Narrative:  Kayla Maxwell is a 75 y.o. female with medical history significant for peripheral artery disease status post bilateral stent placement, hypertension, bilateral neuropathy, heart failure reduced ejection fraction, hyperlipidemia, COPD, history of tobacco abuse, history of a stroke, TIA, atrial fibrillation, on anticoagulation, history of metabolic encephalopathy, H29 deficiency, iron deficiency anemia, vitamin D deficiency, history of sacral pressure ulcer, who [facility] for chief concerns of altered mental status.    10/26 family wanted comfort care and hospice to see pt for evaluation. All night pt was restless.  Consultants:    Procedures:   Antimicrobials:      Subjective: Pt restless in bed. Doesn't respond. Family at bedside  Objective: Vitals:   10/11/2020 1620 10/05/2020 1630 11/01/2020 1637 11/17/2020 0006  BP: (!) 237/226 (!) 191/158  (!) 111/53  Pulse:  87  (!) 145  Resp: 20 (!) 21 19   Temp:      TempSrc:      SpO2:  (!) 65% 98% 90%    Intake/Output Summary (Last 24 hours) at 17-Nov-2020 1044 Last data filed at 11-17-20 0855 Gross per 24 hour  Intake --  Output 400 ml  Net -400 ml   There were no vitals filed for this visit.  Examination:  General exam: restless, nad Respiratory system: cta anteriorly with poor resp effort Cardiovascular system: S1 & S2 heard, RRR. No gallop Gastrointestinal system: Abdomen is nondistended, soft and nontender. Normal bowel sounds heard. Central nervous system: unable to assess Extremities: no edema Psychiatry: Mood & affect appropriate in current setting.     Data Reviewed: I have personally reviewed following labs and imaging studies  CBC: Recent Labs  Lab 10/23/20 0512 11/01/2020 1325  WBC 8.9 10.6*  NEUTROABS  --  9.3*  HGB 9.3* 9.1*  HCT 33.0*  34.1*  MCV 83.3 90.7  PLT 169 924   Basic Metabolic Panel: Recent Labs  Lab 10/23/20 0512 10/08/2020 1325  NA 139 142  K 3.8 4.3  CL 93* 99  CO2 40* 36*  GLUCOSE 98 108*  BUN 26* 31*  CREATININE 0.78 0.89  CALCIUM 8.7* 8.6*  MG 2.1  --   PHOS 3.7  --    GFR: Estimated Creatinine Clearance: 57.1 mL/min (by C-G formula based on SCr of 0.89 mg/dL). Liver Function Tests: Recent Labs  Lab 11/03/2020 1325  AST 25  ALT 29  ALKPHOS 69  BILITOT 1.2  PROT 6.2*  ALBUMIN 3.5   No results for input(s): LIPASE, AMYLASE in the last 168 hours. Recent Labs  Lab 10/27/2020 1324  AMMONIA 18   Coagulation Profile: Recent Labs  Lab 10/24/2020 1325  INR 1.5*   Cardiac Enzymes: No results for input(s): CKTOTAL, CKMB, CKMBINDEX, TROPONINI in the last 168 hours. BNP (last 3 results) No results for input(s): PROBNP in the last 8760 hours. HbA1C: No results for input(s): HGBA1C in the last 72 hours. CBG: No results for input(s): GLUCAP in the last 168 hours. Lipid Profile: No results for input(s): CHOL, HDL, LDLCALC, TRIG, CHOLHDL, LDLDIRECT in the last 72 hours. Thyroid Function Tests: No results for input(s): TSH, T4TOTAL, FREET4, T3FREE, THYROIDAB in the last 72 hours. Anemia Panel: No results for input(s): VITAMINB12, FOLATE, FERRITIN, TIBC, IRON, RETICCTPCT in the last 72 hours. Sepsis Labs: Recent Labs  Lab 10/10/2020 1323 10/05/2020 1325  PROCALCITON  --  <  0.10  LATICACIDVEN 1.0  --     Recent Results (from the past 240 hour(s))  Resp Panel by RT-PCR (Flu A&B, Covid) Nasopharyngeal Swab     Status: None   Collection Time: 10/23/20  2:15 PM   Specimen: Nasopharyngeal Swab; Nasopharyngeal(NP) swabs in vial transport medium  Result Value Ref Range Status   SARS Coronavirus 2 by RT PCR NEGATIVE NEGATIVE Final    Comment: (NOTE) SARS-CoV-2 target nucleic acids are NOT DETECTED.  The SARS-CoV-2 RNA is generally detectable in upper respiratory specimens during the acute phase  of infection. The lowest concentration of SARS-CoV-2 viral copies this assay can detect is 138 copies/mL. A negative result does not preclude SARS-Cov-2 infection and should not be used as the sole basis for treatment or other patient management decisions. A negative result may occur with  improper specimen collection/handling, submission of specimen other than nasopharyngeal swab, presence of viral mutation(s) within the areas targeted by this assay, and inadequate number of viral copies(<138 copies/mL). A negative result must be combined with clinical observations, patient history, and epidemiological information. The expected result is Negative.  Fact Sheet for Patients:  EntrepreneurPulse.com.au  Fact Sheet for Healthcare Providers:  IncredibleEmployment.be  This test is no t yet approved or cleared by the [place] FDA and  has been authorized for detection and/or diagnosis of SARS-CoV-2 by FDA under an Emergency Use Authorization (EUA). This EUA will remain  in effect (meaning this test can be used) for the duration of the COVID-19 declaration under Section 564(b)(1) of the Act, 21 U.S.C.section 360bbb-3(b)(1), unless the authorization is terminated  or revoked sooner.       Influenza A by PCR NEGATIVE NEGATIVE Final   Influenza B by PCR NEGATIVE NEGATIVE Final    Comment: (NOTE) The Xpert Xpress SARS-CoV-2/FLU/RSV plus assay is intended as an aid in the diagnosis of influenza from Nasopharyngeal swab specimens and should not be used as a sole basis for treatment. Nasal washings and aspirates are unacceptable for Xpert Xpress SARS-CoV-2/FLU/RSV testing.  Fact Sheet for Patients: EntrepreneurPulse.com.au  Fact Sheet for Healthcare Providers: IncredibleEmployment.be  This test is not yet approved or cleared by the [place] FDA and has been authorized for detection and/or diagnosis of  SARS-CoV-2 by FDA under an Emergency Use Authorization (EUA). This EUA will remain in effect (meaning this test can be used) for the duration of the COVID-19 declaration under Section 564(b)(1) of the Act, 21 U.S.C. section 360bbb-3(b)(1), unless the authorization is terminated or revoked.  Performed at [facility], [address] [place], [place] [address]   Blood culture (routine single)     Status: None (Preliminary result)   Collection Time: 10/26/2020  1:25 PM   Specimen: BLOOD  Result Value Ref Range Status   Specimen Description BLOOD RIGHT LOWER ARM  Final   Special Requests   Final    BOTTLES DRAWN AEROBIC AND ANAEROBIC Blood Culture adequate volume   Culture   Final    NO GROWTH < 24 HOURS Performed at [facility], [address], [place], [place] [address]    Report Status PENDING  Incomplete  Resp Panel by RT-PCR (Flu A&B, Covid) Nasopharyngeal Swab     Status: None   Collection Time: 10/04/2020  4:16 PM   Specimen: Nasopharyngeal Swab; Nasopharyngeal(NP) swabs in vial transport medium  Result Value Ref Range Status   SARS Coronavirus 2 by RT PCR NEGATIVE NEGATIVE Final    Comment: (NOTE) SARS-CoV-2 target nucleic acids are NOT  DETECTED.  The SARS-CoV-2 RNA is generally detectable in upper respiratory specimens during the acute phase of infection. The lowest concentration of SARS-CoV-2 viral copies this assay can detect is 138 copies/mL. A negative result does not preclude SARS-Cov-2 infection and should not be used as the sole basis for treatment or other patient management decisions. A negative result may occur with  improper specimen collection/handling, submission of specimen other than nasopharyngeal swab, presence of viral mutation(s) within the areas targeted by this assay, and inadequate number of viral copies(<138 copies/mL). A negative result must be combined with clinical observations, patient history, and epidemiological information.  The expected result is Negative.  Fact Sheet for Patients:  EntrepreneurPulse.com.au  Fact Sheet for Healthcare Providers:  IncredibleEmployment.be  This test is no t yet approved or cleared by the [place] FDA and  has been authorized for detection and/or diagnosis of SARS-CoV-2 by FDA under an Emergency Use Authorization (EUA). This EUA will remain  in effect (meaning this test can be used) for the duration of the COVID-19 declaration under Section 564(b)(1) of the Act, 21 U.S.C.section 360bbb-3(b)(1), unless the authorization is terminated  or revoked sooner.       Influenza A by PCR NEGATIVE NEGATIVE Final   Influenza B by PCR NEGATIVE NEGATIVE Final    Comment: (NOTE) The Xpert Xpress SARS-CoV-2/FLU/RSV plus assay is intended as an aid in the diagnosis of influenza from Nasopharyngeal swab specimens and should not be used as a sole basis for treatment. Nasal washings and aspirates are unacceptable for Xpert Xpress SARS-CoV-2/FLU/RSV testing.  Fact Sheet for Patients: EntrepreneurPulse.com.au  Fact Sheet for Healthcare Providers: IncredibleEmployment.be  This test is not yet approved or cleared by the [place] FDA and has been authorized for detection and/or diagnosis of SARS-CoV-2 by FDA under an Emergency Use Authorization (EUA). This EUA will remain in effect (meaning this test can be used) for the duration of the COVID-19 declaration under Section 564(b)(1) of the Act, 21 U.S.C. section 360bbb-3(b)(1), unless the authorization is terminated or revoked.  Performed at [facility], [address] [place], [place] [address]          Radiology Studies: CT HEAD WO CONTRAST (5MM)  Result Date: 10/15/2020 CLINICAL DATA:  Delirium. EXAM: CT HEAD WITHOUT CONTRAST TECHNIQUE: Contiguous axial images were obtained from the base of the skull through the vertex without  intravenous contrast. COMPARISON:  CT head 10/15/2020 FINDINGS: Brain: No evidence of acute infarction, hemorrhage, hydrocephalus, extra-axial collection or mass lesion/mass effect. Unchanged encephalomalacia in the left parietal lobe. Chronic infarct in the left putamen. Area of low attenuation inferior left cerebellum is again noted, similar to most recent exam. Vascular: No hyperdense vessel or unexpected calcification. Skull: Normal. Negative for fracture or focal lesion. Sinuses/Orbits: No acute finding. Other: A 0.9 cm soft tissue density in the left external auditory canal likely represents cerumen. IMPRESSION: No acute intracranial abnormality. Electronically Signed   By: Ileana Roup M.D.   On: 10/21/2020 16:32   DG Chest Portable 1 View  Result Date: 10/24/2020 CLINICAL DATA:  Confusion EXAM: PORTABLE CHEST 1 VIEW COMPARISON:  10/21/2020 FINDINGS: Increased perihilar and basilar opacities. Bilateral pleural effusions remain present with bibasilar atelectasis. Similar cardiomediastinal contours. IMPRESSION: Increased pulmonary edema. Persistent pleural effusions with bibasilar atelectasis. Electronically Signed   By: Macy Mis M.D.   On: 10/14/2020 13:57        Scheduled Meds: Continuous Infusions:  Assessment & Plan:   Principal Problem:   End of life care  Active Problems:   Edema   Atrial fibrillation by electrocardiography [facility])   PAD (peripheral artery disease) (HCC)   Benign essential HTN   Venous insufficiency of both lower extremities   Decreased activities of daily living (ADL)   Neuropathy of both feet   Acute systolic CHF (congestive heart failure) (HCC)   Acute respiratory failure with hypoxia (HCC)   Pressure injury of skin   COPD with acute exacerbation (HCC)   History of tobacco use disorder   Ecchymosis   Leukocytosis   Failure to thrive in adult   Pulmonary edema   Acute metabolic encephalopathy   Principal Problem:   End of life care    #  End-of-life admission Patient currently on comfort care measures due to family's request Hospice has been consulted pending Haldol for agitation Morphine for pain Ativan for anxiety O2 supplementation as needed    # Acute metabolic encephalopathy Patient appears anxious and confused   # Acute on chronic hypoxic respiratory failure-admission Patient had desatted to the 60s on 2 L at the facility.  In ER was 98% on 5 L nasal cannula Now comfort measures   # Bilateral pulmonary edema/pleural effusion-present on admission Comfort measures   # COPD # Chronic O2 dependence # Multiple ecchymosis - POA  # History of tobacco use disorder # Atrial fibrillation # Peripheral artery disease-status post stent placement # Primary hypertension # Decrease activities of daily living # Failure to thrive # Leukocytosis # Obesity     DVT prophylaxis: scd Code Status:DNR Family Communication: Family at bedside including daughter Disposition Plan: hospice pending. Status is: Inpatient  Remains inpatient appropriate because: severity of her illness, requiring iv medications            LOS: 1 day   Time spent: 20 minutes with more than 50% on [place], MD [facility] Pager [phone]  If 7PM-7AM, please contact night-coverage 11/25/20, 10:44 AM

## 2020-11-04 NOTE — Progress Notes (Signed)
[facility]) Hospital Liaison Note  Received referral for hospice services. Patient passed away just prior to [facility] hospital liaison arrival to patient's room.  Family at bedside. Grieving appropriately. Support provided to family and phone number for bereavement services given to family.   Bobbie "Loren Racer, RN, BSN [facility] Liaison [phone]

## 2020-11-04 NOTE — Telephone Encounter (Signed)
Copied from [place] [id number]. Topic: General - Deceased Patient >> 11-06-20  3:27 PM Loma [place] wrote: Reason for CRM: Daughter called in to say mother passed away this morning. Nov 06, 2020  Route to department's PEC Pool.

## 2020-11-04 NOTE — TOC Progression Note (Signed)
Transition of Care [facility]) - Progression Note    Patient Details  Name: Kayla Maxwell MRN: [id number] Date of Birth: [birth year]  Transition of Care [facility]) CM/SW Contact  Kerin Salen, RN Phone Number: 11-10-20, 9:13 AM  Clinical Narrative:  Sonia Baller from Hospice notified to do assessment per Attending recommendation.         Expected Discharge Plan and Services                                                 Social Determinants of Health (SDOH) Interventions    Readmission Risk Interventions No flowsheet data found.

## 2020-11-04 DEATH — deceased

## 2020-12-08 LAB — BLOOD GAS, VENOUS
Acid-Base Excess: 14.8 mmol/L — ABNORMAL HIGH (ref 0.0–2.0)
Bicarbonate: 44.2 mmol/L — ABNORMAL HIGH (ref 20.0–28.0)
FIO2: 0.4
O2 Saturation: 51.3 %
Patient temperature: 37
pCO2, Ven: 92 mmHg (ref 44.0–60.0)
pH, Ven: 7.29 (ref 7.250–7.430)

## 2021-01-14 ENCOUNTER — Ambulatory Visit: Payer: Medicare Other | Admitting: Family Medicine
